# Patient Record
Sex: Female | Born: 1935
Health system: Southern US, Community
[De-identification: ages and names within clinical notes are randomized; demographics above are authoritative.]

## PROBLEM LIST (undated history)

## (undated) DIAGNOSIS — G20A1 Parkinson's disease without dyskinesia, without mention of fluctuations: Secondary | ICD-10-CM

## (undated) DIAGNOSIS — F419 Anxiety disorder, unspecified: Secondary | ICD-10-CM

## (undated) DIAGNOSIS — F32A Depression, unspecified: Secondary | ICD-10-CM

## (undated) DIAGNOSIS — D126 Benign neoplasm of colon, unspecified: Secondary | ICD-10-CM

## (undated) DIAGNOSIS — C3492 Malignant neoplasm of unspecified part of left bronchus or lung: Secondary | ICD-10-CM

## (undated) DIAGNOSIS — R918 Other nonspecific abnormal finding of lung field: Principal | ICD-10-CM

## (undated) DIAGNOSIS — N390 Urinary tract infection, site not specified: Secondary | ICD-10-CM

## (undated) DIAGNOSIS — K5909 Other constipation: Secondary | ICD-10-CM

## (undated) DIAGNOSIS — C7951 Secondary malignant neoplasm of bone: Secondary | ICD-10-CM

## (undated) DIAGNOSIS — K649 Unspecified hemorrhoids: Secondary | ICD-10-CM

## (undated) DIAGNOSIS — I950 Idiopathic hypotension: Secondary | ICD-10-CM

## (undated) DIAGNOSIS — C787 Secondary malignant neoplasm of liver and intrahepatic bile duct: Secondary | ICD-10-CM

## (undated) DIAGNOSIS — K219 Gastro-esophageal reflux disease without esophagitis: Secondary | ICD-10-CM

## (undated) DIAGNOSIS — G20 Parkinson's disease: Secondary | ICD-10-CM

## (undated) DIAGNOSIS — J449 Chronic obstructive pulmonary disease, unspecified: Secondary | ICD-10-CM

## (undated) DIAGNOSIS — M1990 Unspecified osteoarthritis, unspecified site: Secondary | ICD-10-CM

## (undated) DIAGNOSIS — C439 Malignant melanoma of skin, unspecified: Secondary | ICD-10-CM

## (undated) DIAGNOSIS — B351 Tinea unguium: Secondary | ICD-10-CM

## (undated) DIAGNOSIS — E785 Hyperlipidemia, unspecified: Secondary | ICD-10-CM

## (undated) DIAGNOSIS — F329 Major depressive disorder, single episode, unspecified: Secondary | ICD-10-CM

## (undated) DIAGNOSIS — C3490 Malignant neoplasm of unspecified part of unspecified bronchus or lung: Secondary | ICD-10-CM

## (undated) DIAGNOSIS — R42 Dizziness and giddiness: Secondary | ICD-10-CM

## (undated) DIAGNOSIS — I8390 Asymptomatic varicose veins of unspecified lower extremity: Secondary | ICD-10-CM

## (undated) DIAGNOSIS — Q249 Congenital malformation of heart, unspecified: Secondary | ICD-10-CM

## (undated) DIAGNOSIS — I4891 Unspecified atrial fibrillation: Secondary | ICD-10-CM

## (undated) HISTORY — DX: Major depressive disorder, single episode, unspecified: F32.9

## (undated) HISTORY — DX: Secondary malignant neoplasm of liver and intrahepatic bile duct: C78.7

## (undated) HISTORY — PX: APPENDECTOMY: SHX54

## (undated) HISTORY — DX: Hyperlipidemia, unspecified: E78.5

## (undated) HISTORY — DX: Unspecified hemorrhoids: K64.9

## (undated) HISTORY — DX: Chronic obstructive pulmonary disease, unspecified: J44.9

## (undated) HISTORY — DX: Asymptomatic varicose veins of unspecified lower extremity: I83.90

## (undated) HISTORY — DX: Benign neoplasm of colon, unspecified: D12.6

## (undated) HISTORY — DX: Gastro-esophageal reflux disease without esophagitis: K21.9

## (undated) HISTORY — DX: Tinea unguium: B35.1

## (undated) HISTORY — DX: Malignant melanoma of skin, unspecified: C43.9

## (undated) HISTORY — DX: Unspecified atrial fibrillation: I48.91

## (undated) HISTORY — DX: Unspecified osteoarthritis, unspecified site: M19.90

## (undated) HISTORY — DX: Other nonspecific abnormal finding of lung field: R91.8

## (undated) HISTORY — DX: Parkinson's disease without dyskinesia, without mention of fluctuations: G20.A1

## (undated) HISTORY — PX: OTHER SURGICAL HISTORY: SHX169

## (undated) HISTORY — DX: Anxiety disorder, unspecified: F41.9

## (undated) HISTORY — DX: Malignant neoplasm of unspecified part of unspecified bronchus or lung: C34.90

## (undated) HISTORY — DX: Other constipation: K59.09

## (undated) HISTORY — DX: Secondary malignant neoplasm of bone: C79.51

## (undated) HISTORY — DX: Depression, unspecified: F32.A

## (undated) HISTORY — DX: Malignant neoplasm of unspecified part of left bronchus or lung: C34.92

## (undated) HISTORY — DX: Congenital malformation of heart, unspecified: Q24.9

## (undated) HISTORY — DX: Parkinson's disease: G20

## (undated) HISTORY — DX: Urinary tract infection, site not specified: N39.0

## (undated) HISTORY — DX: Idiopathic hypotension: I95.0

---

## 1992-02-20 DIAGNOSIS — D126 Benign neoplasm of colon, unspecified: Secondary | ICD-10-CM

## 1992-02-20 HISTORY — DX: Benign neoplasm of colon, unspecified: D12.6

## 1997-09-26 ENCOUNTER — Ambulatory Visit (HOSPITAL_COMMUNITY): Admission: RE | Admit: 1997-09-26 | Discharge: 1997-09-26 | Payer: Self-pay | Admitting: Internal Medicine

## 1999-12-09 ENCOUNTER — Other Ambulatory Visit: Admission: RE | Admit: 1999-12-09 | Discharge: 1999-12-09 | Payer: Self-pay | Admitting: Obstetrics & Gynecology

## 2002-06-20 ENCOUNTER — Other Ambulatory Visit: Admission: RE | Admit: 2002-06-20 | Discharge: 2002-06-20 | Payer: Self-pay | Admitting: Obstetrics & Gynecology

## 2002-10-27 ENCOUNTER — Encounter: Admission: RE | Admit: 2002-10-27 | Discharge: 2002-10-27 | Payer: Self-pay | Admitting: Internal Medicine

## 2002-10-27 ENCOUNTER — Encounter: Payer: Self-pay | Admitting: Internal Medicine

## 2003-02-28 ENCOUNTER — Encounter: Admission: RE | Admit: 2003-02-28 | Discharge: 2003-05-19 | Payer: Self-pay | Admitting: Internal Medicine

## 2004-03-26 ENCOUNTER — Ambulatory Visit (HOSPITAL_COMMUNITY): Admission: RE | Admit: 2004-03-26 | Discharge: 2004-03-26 | Payer: Self-pay | Admitting: Obstetrics & Gynecology

## 2004-03-26 ENCOUNTER — Encounter (INDEPENDENT_AMBULATORY_CARE_PROVIDER_SITE_OTHER): Payer: Self-pay | Admitting: *Deleted

## 2005-02-06 ENCOUNTER — Encounter: Admission: RE | Admit: 2005-02-06 | Discharge: 2005-02-06 | Payer: Self-pay | Admitting: Obstetrics & Gynecology

## 2005-02-06 ENCOUNTER — Encounter (INDEPENDENT_AMBULATORY_CARE_PROVIDER_SITE_OTHER): Payer: Self-pay | Admitting: *Deleted

## 2005-08-29 ENCOUNTER — Ambulatory Visit: Payer: Self-pay | Admitting: Gastroenterology

## 2005-09-10 ENCOUNTER — Encounter: Admission: RE | Admit: 2005-09-10 | Discharge: 2005-09-10 | Payer: Self-pay | Admitting: Obstetrics & Gynecology

## 2005-09-23 ENCOUNTER — Ambulatory Visit: Payer: Self-pay | Admitting: Gastroenterology

## 2006-01-24 ENCOUNTER — Inpatient Hospital Stay (HOSPITAL_COMMUNITY): Admission: EM | Admit: 2006-01-24 | Discharge: 2006-01-25 | Payer: Self-pay | Admitting: Emergency Medicine

## 2006-02-08 ENCOUNTER — Observation Stay (HOSPITAL_COMMUNITY): Admission: EM | Admit: 2006-02-08 | Discharge: 2006-02-09 | Payer: Self-pay | Admitting: Emergency Medicine

## 2006-04-21 HISTORY — PX: COLON RESECTION: SHX5231

## 2006-09-29 ENCOUNTER — Encounter: Admission: RE | Admit: 2006-09-29 | Discharge: 2006-09-29 | Payer: Self-pay | Admitting: Obstetrics & Gynecology

## 2007-02-01 ENCOUNTER — Encounter: Admission: RE | Admit: 2007-02-01 | Discharge: 2007-03-03 | Payer: Self-pay | Admitting: *Deleted

## 2007-02-12 ENCOUNTER — Emergency Department (HOSPITAL_COMMUNITY): Admission: EM | Admit: 2007-02-12 | Discharge: 2007-02-12 | Payer: Self-pay | Admitting: Emergency Medicine

## 2007-02-22 ENCOUNTER — Emergency Department (HOSPITAL_COMMUNITY): Admission: EM | Admit: 2007-02-22 | Discharge: 2007-02-22 | Payer: Self-pay | Admitting: *Deleted

## 2007-02-25 ENCOUNTER — Encounter (INDEPENDENT_AMBULATORY_CARE_PROVIDER_SITE_OTHER): Payer: Self-pay | Admitting: General Surgery

## 2007-02-25 ENCOUNTER — Inpatient Hospital Stay (HOSPITAL_COMMUNITY): Admission: EM | Admit: 2007-02-25 | Discharge: 2007-03-10 | Payer: Self-pay | Admitting: Emergency Medicine

## 2007-04-09 ENCOUNTER — Inpatient Hospital Stay (HOSPITAL_COMMUNITY): Admission: EM | Admit: 2007-04-09 | Discharge: 2007-04-14 | Payer: Self-pay | Admitting: Emergency Medicine

## 2007-05-26 ENCOUNTER — Ambulatory Visit: Payer: Self-pay | Admitting: Gastroenterology

## 2007-08-12 ENCOUNTER — Ambulatory Visit: Payer: Self-pay | Admitting: Gastroenterology

## 2007-08-26 ENCOUNTER — Encounter (INDEPENDENT_AMBULATORY_CARE_PROVIDER_SITE_OTHER): Payer: Self-pay | Admitting: *Deleted

## 2007-08-26 ENCOUNTER — Inpatient Hospital Stay (HOSPITAL_COMMUNITY): Admission: EM | Admit: 2007-08-26 | Discharge: 2007-08-28 | Payer: Self-pay | Admitting: Emergency Medicine

## 2007-08-27 ENCOUNTER — Encounter: Payer: Self-pay | Admitting: Family Medicine

## 2007-10-25 ENCOUNTER — Encounter: Admission: RE | Admit: 2007-10-25 | Discharge: 2007-10-25 | Payer: Self-pay | Admitting: Obstetrics & Gynecology

## 2008-02-14 ENCOUNTER — Ambulatory Visit: Payer: Self-pay | Admitting: Gastroenterology

## 2008-02-14 DIAGNOSIS — K5900 Constipation, unspecified: Secondary | ICD-10-CM | POA: Insufficient documentation

## 2008-02-14 DIAGNOSIS — F411 Generalized anxiety disorder: Secondary | ICD-10-CM | POA: Insufficient documentation

## 2008-02-14 DIAGNOSIS — K648 Other hemorrhoids: Secondary | ICD-10-CM | POA: Insufficient documentation

## 2008-02-14 DIAGNOSIS — Z860100 Personal history of colon polyps, unspecified: Secondary | ICD-10-CM | POA: Insufficient documentation

## 2008-10-25 ENCOUNTER — Encounter: Admission: RE | Admit: 2008-10-25 | Discharge: 2008-10-25 | Payer: Self-pay | Admitting: Internal Medicine

## 2008-11-09 ENCOUNTER — Inpatient Hospital Stay (HOSPITAL_COMMUNITY): Admission: EM | Admit: 2008-11-09 | Discharge: 2008-11-10 | Payer: Self-pay | Admitting: Emergency Medicine

## 2008-12-01 ENCOUNTER — Ambulatory Visit (HOSPITAL_COMMUNITY): Payer: Self-pay | Admitting: Licensed Clinical Social Worker

## 2008-12-15 ENCOUNTER — Ambulatory Visit (HOSPITAL_COMMUNITY): Payer: Self-pay | Admitting: Licensed Clinical Social Worker

## 2008-12-29 ENCOUNTER — Ambulatory Visit (HOSPITAL_COMMUNITY): Payer: Self-pay | Admitting: Licensed Clinical Social Worker

## 2009-01-19 ENCOUNTER — Ambulatory Visit: Payer: Self-pay | Admitting: Pulmonary Disease

## 2009-01-19 DIAGNOSIS — R0989 Other specified symptoms and signs involving the circulatory and respiratory systems: Secondary | ICD-10-CM | POA: Insufficient documentation

## 2009-01-19 DIAGNOSIS — R0609 Other forms of dyspnea: Secondary | ICD-10-CM

## 2009-02-06 ENCOUNTER — Ambulatory Visit: Payer: Self-pay | Admitting: Pulmonary Disease

## 2009-02-09 ENCOUNTER — Ambulatory Visit (HOSPITAL_COMMUNITY): Payer: Self-pay | Admitting: Licensed Clinical Social Worker

## 2009-03-05 ENCOUNTER — Emergency Department (HOSPITAL_COMMUNITY): Admission: EM | Admit: 2009-03-05 | Discharge: 2009-03-05 | Payer: Self-pay | Admitting: Emergency Medicine

## 2009-03-13 ENCOUNTER — Emergency Department (HOSPITAL_COMMUNITY): Admission: EM | Admit: 2009-03-13 | Discharge: 2009-03-13 | Payer: Self-pay | Admitting: Emergency Medicine

## 2009-03-19 ENCOUNTER — Encounter: Payer: Self-pay | Admitting: Nurse Practitioner

## 2009-03-23 ENCOUNTER — Ambulatory Visit (HOSPITAL_COMMUNITY): Payer: Self-pay | Admitting: Licensed Clinical Social Worker

## 2009-03-26 ENCOUNTER — Encounter: Payer: Self-pay | Admitting: Family Medicine

## 2009-03-29 ENCOUNTER — Encounter: Payer: Self-pay | Admitting: Nurse Practitioner

## 2009-03-29 ENCOUNTER — Telehealth: Payer: Self-pay | Admitting: Gastroenterology

## 2009-04-02 ENCOUNTER — Ambulatory Visit: Payer: Self-pay | Admitting: Gastroenterology

## 2009-04-02 DIAGNOSIS — R197 Diarrhea, unspecified: Secondary | ICD-10-CM

## 2009-04-02 LAB — CONVERTED CEMR LAB
Cholesterol, target level: 200 mg/dL
HDL goal, serum: 40 mg/dL

## 2009-04-03 ENCOUNTER — Telehealth (INDEPENDENT_AMBULATORY_CARE_PROVIDER_SITE_OTHER): Payer: Self-pay | Admitting: *Deleted

## 2009-04-18 ENCOUNTER — Ambulatory Visit (HOSPITAL_COMMUNITY): Payer: Self-pay | Admitting: Licensed Clinical Social Worker

## 2009-04-30 ENCOUNTER — Encounter (INDEPENDENT_AMBULATORY_CARE_PROVIDER_SITE_OTHER): Payer: Self-pay | Admitting: *Deleted

## 2009-04-30 ENCOUNTER — Ambulatory Visit: Payer: Self-pay | Admitting: Gastroenterology

## 2009-04-30 DIAGNOSIS — R197 Diarrhea, unspecified: Secondary | ICD-10-CM

## 2009-05-02 ENCOUNTER — Telehealth: Payer: Self-pay | Admitting: Gastroenterology

## 2009-05-04 ENCOUNTER — Telehealth: Payer: Self-pay | Admitting: Gastroenterology

## 2009-05-07 ENCOUNTER — Ambulatory Visit: Payer: Self-pay | Admitting: Gastroenterology

## 2009-05-08 ENCOUNTER — Encounter: Payer: Self-pay | Admitting: Gastroenterology

## 2009-05-08 ENCOUNTER — Telehealth: Payer: Self-pay | Admitting: Gastroenterology

## 2009-05-11 ENCOUNTER — Ambulatory Visit (HOSPITAL_COMMUNITY): Payer: Self-pay | Admitting: Licensed Clinical Social Worker

## 2009-05-18 ENCOUNTER — Ambulatory Visit (HOSPITAL_COMMUNITY): Payer: Self-pay | Admitting: Licensed Clinical Social Worker

## 2009-05-21 ENCOUNTER — Ambulatory Visit (HOSPITAL_COMMUNITY): Payer: Self-pay | Admitting: Psychiatry

## 2009-05-22 ENCOUNTER — Encounter: Admission: RE | Admit: 2009-05-22 | Discharge: 2009-05-31 | Payer: Self-pay | Admitting: Internal Medicine

## 2009-05-31 ENCOUNTER — Ambulatory Visit (HOSPITAL_COMMUNITY): Payer: Self-pay | Admitting: Licensed Clinical Social Worker

## 2009-06-11 ENCOUNTER — Ambulatory Visit (HOSPITAL_COMMUNITY): Payer: Self-pay | Admitting: Psychiatry

## 2009-06-14 ENCOUNTER — Ambulatory Visit (HOSPITAL_COMMUNITY): Payer: Self-pay | Admitting: Licensed Clinical Social Worker

## 2009-06-27 ENCOUNTER — Ambulatory Visit (HOSPITAL_COMMUNITY): Payer: Self-pay | Admitting: Licensed Clinical Social Worker

## 2009-07-23 ENCOUNTER — Ambulatory Visit (HOSPITAL_COMMUNITY): Payer: Self-pay | Admitting: Psychiatry

## 2009-07-24 ENCOUNTER — Ambulatory Visit (HOSPITAL_COMMUNITY): Payer: Self-pay | Admitting: Licensed Clinical Social Worker

## 2009-08-13 ENCOUNTER — Ambulatory Visit (HOSPITAL_COMMUNITY): Payer: Self-pay | Admitting: Licensed Clinical Social Worker

## 2009-09-19 ENCOUNTER — Ambulatory Visit (HOSPITAL_COMMUNITY): Payer: Self-pay | Admitting: Licensed Clinical Social Worker

## 2009-10-03 ENCOUNTER — Ambulatory Visit (HOSPITAL_COMMUNITY): Payer: Self-pay | Admitting: Licensed Clinical Social Worker

## 2009-10-19 ENCOUNTER — Ambulatory Visit (HOSPITAL_COMMUNITY): Payer: Self-pay | Admitting: Licensed Clinical Social Worker

## 2009-10-26 ENCOUNTER — Encounter: Admission: RE | Admit: 2009-10-26 | Discharge: 2009-10-26 | Payer: Self-pay | Admitting: Internal Medicine

## 2009-10-30 ENCOUNTER — Ambulatory Visit (HOSPITAL_COMMUNITY): Payer: Self-pay | Admitting: Licensed Clinical Social Worker

## 2009-11-12 ENCOUNTER — Ambulatory Visit (HOSPITAL_COMMUNITY): Payer: Self-pay | Admitting: Psychiatry

## 2009-11-13 ENCOUNTER — Ambulatory Visit (HOSPITAL_COMMUNITY): Payer: Self-pay | Admitting: Licensed Clinical Social Worker

## 2009-11-28 ENCOUNTER — Ambulatory Visit (HOSPITAL_COMMUNITY): Payer: Self-pay | Admitting: Licensed Clinical Social Worker

## 2009-12-12 ENCOUNTER — Ambulatory Visit (HOSPITAL_COMMUNITY): Payer: Self-pay | Admitting: Licensed Clinical Social Worker

## 2009-12-16 ENCOUNTER — Emergency Department (HOSPITAL_COMMUNITY): Admission: EM | Admit: 2009-12-16 | Discharge: 2009-12-16 | Payer: Self-pay | Admitting: Emergency Medicine

## 2009-12-27 ENCOUNTER — Ambulatory Visit (HOSPITAL_COMMUNITY): Payer: Self-pay | Admitting: Licensed Clinical Social Worker

## 2010-01-07 ENCOUNTER — Ambulatory Visit (HOSPITAL_COMMUNITY): Payer: Self-pay | Admitting: Licensed Clinical Social Worker

## 2010-01-18 ENCOUNTER — Ambulatory Visit (HOSPITAL_COMMUNITY): Payer: Self-pay | Admitting: Psychiatry

## 2010-01-28 ENCOUNTER — Ambulatory Visit (HOSPITAL_COMMUNITY): Payer: Self-pay | Admitting: Licensed Clinical Social Worker

## 2010-02-05 ENCOUNTER — Encounter: Payer: Self-pay | Admitting: Family Medicine

## 2010-02-05 ENCOUNTER — Ambulatory Visit (HOSPITAL_COMMUNITY): Payer: Self-pay | Admitting: Licensed Clinical Social Worker

## 2010-02-19 ENCOUNTER — Ambulatory Visit (HOSPITAL_COMMUNITY): Payer: Self-pay | Admitting: Licensed Clinical Social Worker

## 2010-02-20 ENCOUNTER — Encounter: Payer: Self-pay | Admitting: Family Medicine

## 2010-03-05 ENCOUNTER — Ambulatory Visit (HOSPITAL_COMMUNITY): Payer: Self-pay | Admitting: Licensed Clinical Social Worker

## 2010-03-18 ENCOUNTER — Emergency Department (HOSPITAL_COMMUNITY)
Admission: EM | Admit: 2010-03-18 | Discharge: 2010-03-18 | Payer: Self-pay | Source: Home / Self Care | Admitting: Emergency Medicine

## 2010-03-21 ENCOUNTER — Ambulatory Visit (HOSPITAL_COMMUNITY): Payer: Self-pay | Admitting: Licensed Clinical Social Worker

## 2010-03-22 ENCOUNTER — Ambulatory Visit: Payer: Self-pay | Admitting: Family Medicine

## 2010-03-22 ENCOUNTER — Encounter: Payer: Self-pay | Admitting: Family Medicine

## 2010-03-22 DIAGNOSIS — I4891 Unspecified atrial fibrillation: Secondary | ICD-10-CM

## 2010-03-22 DIAGNOSIS — R42 Dizziness and giddiness: Secondary | ICD-10-CM

## 2010-03-25 LAB — CONVERTED CEMR LAB
CO2: 25 meq/L (ref 19–32)
Calcium: 9.4 mg/dL (ref 8.4–10.5)
Hemoglobin: 12.1 g/dL (ref 12.0–15.0)
Lymphocytes Relative: 25 % (ref 12–46)
MCHC: 32.4 g/dL (ref 30.0–36.0)
Monocytes Relative: 10 % (ref 3–12)
Neutrophils Relative %: 63 % (ref 43–77)
RDW: 14.1 % (ref 11.5–15.5)
TSH: 3.635 microintl units/mL (ref 0.350–4.500)
WBC: 8.7 10*3/uL (ref 4.0–10.5)

## 2010-03-27 ENCOUNTER — Ambulatory Visit: Payer: Self-pay | Admitting: Family Medicine

## 2010-03-27 ENCOUNTER — Ambulatory Visit (HOSPITAL_BASED_OUTPATIENT_CLINIC_OR_DEPARTMENT_OTHER)
Admission: RE | Admit: 2010-03-27 | Discharge: 2010-03-27 | Payer: Self-pay | Source: Home / Self Care | Admitting: Family Medicine

## 2010-03-27 DIAGNOSIS — S0100XA Unspecified open wound of scalp, initial encounter: Secondary | ICD-10-CM | POA: Insufficient documentation

## 2010-03-28 ENCOUNTER — Telehealth (INDEPENDENT_AMBULATORY_CARE_PROVIDER_SITE_OTHER): Payer: Self-pay | Admitting: *Deleted

## 2010-03-28 ENCOUNTER — Encounter
Admission: RE | Admit: 2010-03-28 | Discharge: 2010-03-28 | Payer: Self-pay | Source: Home / Self Care | Attending: Internal Medicine | Admitting: Internal Medicine

## 2010-03-28 DIAGNOSIS — S32009A Unspecified fracture of unspecified lumbar vertebra, initial encounter for closed fracture: Secondary | ICD-10-CM | POA: Insufficient documentation

## 2010-04-01 ENCOUNTER — Ambulatory Visit (HOSPITAL_COMMUNITY): Payer: Self-pay | Admitting: Psychiatry

## 2010-04-01 ENCOUNTER — Telehealth: Payer: Self-pay | Admitting: Family Medicine

## 2010-04-02 ENCOUNTER — Telehealth (INDEPENDENT_AMBULATORY_CARE_PROVIDER_SITE_OTHER): Payer: Self-pay | Admitting: *Deleted

## 2010-04-02 ENCOUNTER — Ambulatory Visit (HOSPITAL_COMMUNITY): Payer: Self-pay | Admitting: Licensed Clinical Social Worker

## 2010-04-03 ENCOUNTER — Encounter: Payer: Self-pay | Admitting: Family Medicine

## 2010-04-17 ENCOUNTER — Ambulatory Visit (HOSPITAL_COMMUNITY)
Admission: RE | Admit: 2010-04-17 | Discharge: 2010-04-17 | Payer: Self-pay | Source: Home / Self Care | Attending: Family Medicine | Admitting: Family Medicine

## 2010-04-18 ENCOUNTER — Telehealth (INDEPENDENT_AMBULATORY_CARE_PROVIDER_SITE_OTHER): Payer: Self-pay | Admitting: *Deleted

## 2010-04-18 ENCOUNTER — Encounter
Admission: RE | Admit: 2010-04-18 | Discharge: 2010-04-18 | Payer: Self-pay | Source: Home / Self Care | Attending: Family Medicine | Admitting: Family Medicine

## 2010-04-19 ENCOUNTER — Ambulatory Visit (HOSPITAL_COMMUNITY)
Admission: RE | Admit: 2010-04-19 | Discharge: 2010-04-19 | Payer: Self-pay | Source: Home / Self Care | Attending: Psychiatry | Admitting: Psychiatry

## 2010-05-04 ENCOUNTER — Encounter: Payer: Self-pay | Admitting: Family Medicine

## 2010-05-09 ENCOUNTER — Ambulatory Visit (HOSPITAL_COMMUNITY)
Admission: RE | Admit: 2010-05-09 | Discharge: 2010-05-09 | Payer: Self-pay | Source: Home / Self Care | Attending: Licensed Clinical Social Worker | Admitting: Licensed Clinical Social Worker

## 2010-05-12 ENCOUNTER — Encounter: Payer: Self-pay | Admitting: Obstetrics & Gynecology

## 2010-05-12 ENCOUNTER — Encounter: Payer: Self-pay | Admitting: Ophthalmology

## 2010-05-21 ENCOUNTER — Ambulatory Visit (HOSPITAL_COMMUNITY)
Admission: RE | Admit: 2010-05-21 | Discharge: 2010-05-21 | Payer: Self-pay | Source: Home / Self Care | Attending: Licensed Clinical Social Worker | Admitting: Licensed Clinical Social Worker

## 2010-05-23 NOTE — Progress Notes (Signed)
Summary: Interventional Radiology referral  Phone Note Other Incoming Call back at [phone] Darel Hong   Caller: [facility] Summary of Call: Per phone call from [facility], Per Dr. Maryclare Bean, Radiologist, he is requiring patient to have a Nuclear Bone Scan in addition to the CT she has already had, prior to scheduling her for the Vertebroplasty, will you order please. Initial call taken by: Magdalen Spatz [facility],  April 01, 2010 3:09 PM  Follow-up for Phone Call        ok. Follow-up by: Neena Rhymes MD,  April 01, 2010 3:35 PM

## 2010-05-23 NOTE — Letter (Signed)
Summary: [facility]  [facility]   Imported By: Lanelle Bal 05/17/2010 11:42:44  _____________________________________________________________________  External Attachment:    Type:   Image     Comment:   External Document

## 2010-05-23 NOTE — Progress Notes (Signed)
Summary: triage   Phone Note Call from Patient Call back at Home Phone [phone]   Caller: Patient Call For: Russella Dar Reason for Call: Talk to Nurse Summary of Call: Patient is scheduled for a procedure on Monday but is having problems with her neck, (stiff neck) wants to know if she can take meds for this. Initial call taken by: Tawni Levy,  May 02, 2009 1:05 PM  Follow-up for Phone Call        Pt. c/o a stiff neck and sore throat. Denes fever, n/v.  States she has been around several people with colds/flu.  1) Tylenol as needed 2) Chloraseptic spray or warm salt water gargles as needed 3) If cold/flu symptoms progress, please call your PCP ASAP 4) Pt. instructed to call back as needed.  Follow-up by: Laureen Ochs LPN,  May 02, 2009 2:32 PM  Additional Follow-up for Phone Call Additional follow up Details #1::        Agree. Please have her discuss further with PCP if symtoms persist. Additional Follow-up by: Meryl Dare MD [facility],  May 02, 2009 2:42 PM

## 2010-05-23 NOTE — Procedures (Signed)
Summary: Colonoscopy  Patient: [name] Note: All result statuses are Final unless otherwise noted.  Tests: (1) Colonoscopy (COL)   COL Colonoscopy           DONE     [facility]     [address]     [place], [place]  [address]           COLONOSCOPY PROCEDURE REPORT           PATIENT:  [name]  MR#:  [id number]     BIRTHDATE:  11-09-1927, 77 yrs. old  GENDER:  female           ENDOSCOPIST:  Judie Petit T. 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           MEDICATIONS:   Fentanyl 100 mcg IV, Versed 10 mg IV           DESCRIPTION OF PROCEDURE:   After the risks benefits and     alternatives of the procedure were thoroughly explained, informed     consent was obtained.  Digital rectal exam was performed and     revealed no abnormalities.   The LB [id number] endoscope     was introduced through the anus and advanced to the cecum, which     was identified by both the appendix and ileocecal valve, limited     by fair prep, a tortuous and redundant colon.    The quality of     the prep was Moviprep fair.  Extensive rinsing and suctioning was     required to acheive an adequate, fair prep. The instrument was     then slowly withdrawn as the colon was fully examined.     <<PROCEDUREIMAGES>>           FINDINGS:  A normal appearing cecum, ileocecal valve, and     appendiceal orifice were identified. The ascending, hepatic     flexure, transverse, splenic flexure, descending, sigmoid colon,     and rectum appeared unremarkable.  Random biopsies were obtained     throughout the colon and sent to pathology.   Retroflexed views in     the rectum revealed internal hemorrhoids, small. The time to cecum     =  9  minutes. The scope was then withdrawn (time =  12.75  min)     from  the patient and the procedure completed.           COMPLICATIONS:  None           ENDOSCOPIC IMPRESSION:     1) Normal colon     2) Internal hemorrhoids           RECOMMENDATIONS:     1) Return to the care of your primary provider. GI follow up as     needed.     2) DC metronidazole     3) Align one daily for 1-2 months     4) Imodium qd as needed for diarrhea     5) No plans for future surveillance colonoscopies given lack of     polyps today and age     77) Await pathology  Venita Lick. Russella Dar, MD, [facility] Graham           CC: Murray Hodgkins, MD           n.     Rosalie DoctorVenita Lick. Stark at 05/07/2009 09:08 AM           Harvel Ricks, [id number]  Note: An exclamation mark (!) indicates a result that was not dispersed into the flowsheet. Document Creation Date: 05/07/2009 9:08 AM _______________________________________________________________________  (1) Order result status: Final Collection or observation date-time: 05/07/2009 09:02 Requested date-time:  Receipt date-time:  Reported date-time:  Referring Physician:   Ordering Physician: Claudette Head [phone]) Specimen Source:  Source: Launa Grill Order Number: [id number] Lab site:

## 2010-05-23 NOTE — Cardiovascular Report (Signed)
Summary: [facility]  [facility]   Imported By: Lanelle Bal 04/03/2010 15:53:26  _____________________________________________________________________  External Attachment:    Type:   Image     Comment:   External Document

## 2010-05-23 NOTE — Progress Notes (Signed)
Summary: f.u call   Phone Note Call from Patient Call back at Home Phone [phone]   Caller: Patient Call For: [name] Reason for Call: Talk to Nurse Summary of Call: Patient is calling back because last night she had a temperature of 100.4 and took tylenol and was tol d to call us this afternoon and advised what was her temp and she states that is 97.2 (states that it's below normal) Initial call taken by: Tawni Levy,  May 08, 2009 12:23 PM  Follow-up for Phone Call        returned pt. call. Pt. just wanted to tell us her temp. is now 97.2 and she feels better. She is eating and has had loose bm and has passed flatus. Thanked pt. for calling us back. Follow-up by: Alease Frame RN,  May 08, 2009 1:52 PM

## 2010-05-23 NOTE — Assessment & Plan Note (Signed)
Summary: f/u Diarrhea   History of Present Illness Visit Type: Follow-up Visit Primary GI MD: Elie Goody MD [facility] Primary Provider: Murray Hodgkins, MD / Dr. Marijo [name]  Chief Complaint: F/u for diarrhea and still having numerous loose stools qd.  History of Present Illness:   [name] complains of recurrent diarrhea. Completed in prolonged course of metronidazole for presumed C. difficile diarrhea on 12/20 and her diarrhea had completely resolved. She noted recurrent diarrhea beginning on January 6. She is now having 3-5 loose bowel movements each morning. She has no other gastrointestinal complaints.   GI Review of Systems      Denies abdominal pain, acid reflux, belching, bloating, chest pain, dysphagia with liquids, dysphagia with solids, heartburn, loss of appetite, nausea, vomiting, vomiting blood, weight loss, and  weight gain.      Reports diarrhea.     Denies anal fissure, black tarry stools, change in bowel habit, constipation, diverticulosis, fecal incontinence, heme positive stool, hemorrhoids, irritable bowel syndrome, jaundice, light color stool, liver problems, rectal bleeding, and  rectal pain.   Current Medications (verified): 1)  Calcium (Unknown Dosage) .... Take 1 Tablet By Mouth Once A Day 2)  Fish Oil 1000 Mg Caps (Omega-3 Fatty Acids) .... Take 1 Capsule By Mouth Once A Day 3)  Multivitamins  Tabs (Multiple Vitamin) .... Take 1 Tablet By Mouth Once A Day 4)  Imodium A-D 2 Mg Tabs (Loperamide Hcl) .... Take 1 Tablet By Mouth Two Times A Day 5)  Citalopram Hydrobromide 20 Mg Tabs (Citalopram Hydrobromide) .... Take 1 Tablet By Mouth Once A Day  Allergies (verified): 1)  ! Amoxicillin 2)  ! * Ciprofloxacin  Past History:  Past Medical History: Reviewed history from 04/02/2009 and no changes required. 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 Fib Recurrent UTIs  Past Surgical History: Reviewed history from 02/14/2008 and no changes required. 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 No FH of Colon Cancer: asthma: brother cancer: father (throat)  Lupus Two Daughters  Social History: Reviewed history from 01/19/2009 and no changes required. widowed. pt has children. retired.  previously worked as a Product/process development scientist. Patient is a former smoker.  started at age 35.  1 1/2 ppd.  quit 1982. Alcohol Use - no  Review of Systems       The pertinent positives and negatives are noted as above and in the HPI. All other ROS were reviewed and were negative.   Vital Signs:  Patient profile:   77 year old female Height:      72 inches Weight:      145.4 pounds BMI:     19.79 Pulse rate:   90 / minute Pulse rhythm:   regular BP sitting:   90 / 42  (right arm) Cuff size:   regular  Vitals Entered By: Christie Nottingham CMA Duncan Dull) (April 30, 2009 9:11 AM)  Physical Exam  General:  Well developed, well nourished, no acute distress. Head:  Normocephalic and atraumatic. Eyes:  PERRLA, no icterus. Ears:  Normal auditory acuity. Mouth:  No deformity or lesions, dentition normal. Lungs:  Clear throughout to auscultation. Heart:  Regular rate and rhythm; no murmurs, rubs,  or bruits. Abdomen:  Soft, nontender and nondistended. No masses, hepatosplenomegaly or hernias noted. Normal bowel sounds. Rectal:  deferred until time of colonoscopy.   Psych:  very anxious.  Impression & Recommendations:  Problem # 1:  DIARRHEA (ICD-787.91) Sshe has a history of chronic constipation, but has had recurrent diarrhea after taking several courses of antibiotics for urinary tract infections. This was presumed to be C. difficile, and appeared to respond to metronidazole. Rule out recurrent C.  difficile, lymphocytic colitis and other disorders. Retreat with 4 weeks of metronidazole. The risks, benefits and alternatives to colonoscopy with possible biopsy and possible polypectomy were discussed with the patient and they consent to proceed. The procedure will be scheduled electively.  Problem # 2:  ANXIETY (ICD-300.00) She has substantial anxiety, which appears to be interfering with her functional status. Defer to Dr. Chilton Si for further management.  Other Orders: Colonoscopy (Colon)  Patient Instructions: 1)  Pick up your prescriptions at your pharmacy.  2)  Colonoscopy brochure given.  3)  Conscious Sedation brochure given.  4)  Copy sent to : Catherine Hodgkins, MD 5)  The medication list was reviewed and reconciled.  All changed / newly prescribed medications were explained.  A complete medication list was provided to the patient / caregiver.  Prescriptions: METRONIDAZOLE 250 MG TABS (METRONIDAZOLE) one tablet by mouth four times a day  #112 x 0   Entered by:   Christie Nottingham CMA (AAMA)   Authorized by:   Meryl Dare MD [facility]   Signed by:   Meryl Dare MD FACG on 04/30/2009   Method used:   Electronically to        [facility] (retail)       [address]       [place], [place]  [address]       Ph: [phone]       Fax: [phone]   RxID:   [id number] MOVIPREP 100 GM  SOLR (PEG-KCL-NACL-NASULF-NA ASC-C) As per prep instructions.  #1 x 0   Entered by:   Christie Nottingham CMA (AAMA)   Authorized by:   Meryl Dare MD [facility]   Signed by:   Meryl Dare MD [facility] on 04/30/2009   Method used:   Electronically to        [facility] (retail)       [address]       [place], [place]  [address]       Ph: [phone]       Fax: [phone]   RxID:   [id number]

## 2010-05-23 NOTE — Progress Notes (Signed)
Summary: REFILL  Phone Note Refill Request Message from:  Patient on April 18, 2010 9:03 AM  Refills Requested: Medication #1:  OMEPRAZOLE 20 MG CPDR take one tablet daily.   Dosage confirmed as above?Dosage Confirmed   Supply Requested: 1 month [facility]  Next Appointment Scheduled: NONE Initial call taken by: Lavell Islam,  April 18, 2010 9:03 AM    Prescriptions: OMEPRAZOLE 20 MG CPDR (OMEPRAZOLE) take one tablet daily  #30 x 6   Entered by:   Doristine Devoid CMA   Authorized by:   Neena Rhymes MD   Signed by:   Doristine Devoid CMA on 04/18/2010   Method used:   Electronically to        [facility] (retail)       [address]       [place], [place]  [address]       Ph: [phone]       Fax: [phone]   RxID:   [id number]

## 2010-05-23 NOTE — Progress Notes (Signed)
Summary: meds symptoms   Phone Note Call from Patient Call back at [phone] (daughter)   Caller: daughter, [name] Call For: Dr. Russella Dar Reason for Call: Talk to Nurse Summary of Call: would like to discuss side effects of med pt is taking for diarrhea... pt complaining of a "stiff neck"  Initial call taken by: Vallarie Mare,  May 04, 2009 8:49 AM  Follow-up for Phone Call        I spoke with the patient's daughter she reports her mother is c/o a sore throat and a stiff neck.  Patient  did call on Tuesday and spoke with Stanton Kidney with the same complaints she was recommended at that time to see her primary care.  Patient  hasn't been to her primary care. I did try and reach the patient at her home this am , see phone note from the patient , she doesn't answer.  I have left a message for the patient to call us back and the daughter also tried to reach her while I was on the phone with her.  The daughter will continue and try and reach her mom and have her call us back. Follow-up by: Darcey Nora RN, CGRN,  May 04, 2009 9:56 AM  Additional Follow-up for Phone Call Additional follow up Details #1::        see other phone note from today Additional Follow-up by: Darcey Nora RN, CGRN,  May 04, 2009 11:43 AM

## 2010-05-23 NOTE — Letter (Signed)
Summary: [facility] Inst[facility]  [facility]  [address] [place], [place] [address]   Phone: [phone]  Fax: [phone]       LAMEKA DISLA    [birth year]    MRN: [id number]        Procedure Day /Date: Monday January 17th, 2011     Arrival Time: 7:30am     Procedure Time: 8:30am     Location of Procedure:                    _ x_  [facility] (4th Floor)                        PREPARATION FOR COLONOSCOPY WITH MOVIPREP   Starting 5 days prior to your procedure 05/02/09  do not eat nuts, seeds, popcorn, corn, beans, peas,  salads, or any raw vegetables.  Do not take any fiber supplements (e.g. Metamucil, Citrucel, and Benefiber).  THE DAY BEFORE YOUR PROCEDURE         DATE:  05/06/09   DAY:  Sunday   1.  Drink clear liquids the entire day-NO SOLID FOOD  2.  Do not drink anything colored red or purple.  Avoid juices with pulp.  No orange juice.  3.  Drink at least 64 oz. (8 glasses) of fluid/clear liquids during the day to prevent dehydration and help the prep work efficiently.  CLEAR LIQUIDS INCLUDE: Water Jello Ice Popsicles Tea (sugar ok, no milk/cream) Powdered fruit flavored drinks Coffee (sugar ok, no milk/cream) Gatorade Juice: apple, white grape, white cranberry  Lemonade Clear bullion, consomm, broth Carbonated beverages (any kind) Strained chicken noodle soup Hard Candy                             4.  In the morning, mix first dose of MoviPrep solution:    Empty 1 Pouch A and 1 Pouch B into the disposable container    Add lukewarm drinking water to the top line of the container. Mix to dissolve    Refrigerate (mixed solution should be used within 24 hrs)  5.  Begin drinking the prep at 5:00 p.m. The MoviPrep container is divided by 4 marks.   Every 15 minutes drink the solution down to the next mark (approximately 8 oz) until the full liter is complete.   6.  Follow completed prep with 16 oz of clear liquid of your  choice (Nothing red or purple).  Continue to drink clear liquids until bedtime.  7.  Before going to bed, mix second dose of MoviPrep solution:    Empty 1 Pouch A and 1 Pouch B into the disposable container    Add lukewarm drinking water to the top line of the container. Mix to dissolve    Refrigerate  THE DAY OF YOUR PROCEDURE      DATE:  05/07/09  DAY:  Monday  Beginning at 3:30  a.m. (5 hours before procedure):         1. Every 15 minutes, drink the solution down to the next mark (approx 8 oz) until the full liter is complete.  2. Follow completed prep with 16 oz. of clear liquid of your choice.    3. You may drink clear liquids until  6:30am  (2 HOURS BEFORE PROCEDURE).   MEDICATION INSTRUCTIONS  Unless otherwise instructed, you should take regular prescription medications with a small sip of  water   as early as possible the morning of your procedure.        OTHER INSTRUCTIONS  You will need a responsible adult at least 75 years of age to accompany you and drive you home.   This person must remain in the waiting room during your procedure.  Wear loose fitting clothing that is easily removed.  Leave jewelry and other valuables at home.  However, you may wish to bring a book to read or  an iPod/MP3 player to listen to music as you wait for your procedure to start.  Remove all body piercing jewelry and leave at home.  Total time from sign-in until discharge is approximately 2-3 hours.  You should go home directly after your procedure and rest.  You can resume normal activities the  day after your procedure.  The day of your procedure you should not:   Drive   Make legal decisions   Operate machinery   Drink alcohol   Return to work  You will receive specific instructions about eating, activities and medications before you leave.    The above instructions have been reviewed and explained to me by   Marchelle Folks.     I fully understand and can verbalize  these instructions _____________________________ Date _________

## 2010-05-23 NOTE — Progress Notes (Signed)
Summary: xray results  Phone Note Outgoing Call   Call placed by: Doristine Devoid CMA,  March 28, 2010 4:02 PM Call placed to: Patient Summary of Call: pt has 2 new lumbar compression fractures.  needs interventional radiology referral to determine whether treatment is appropriate.  also needs DEXA scan.  should start Ca + D supplement daily if not already taking one.  please ask whether she is having back pain.  if she is, we can give her some meds for pain.  Follow-up for Phone Call        spoke w/ patient aware of information and referral also informed would like appt after holidays .....Marland KitchenMarland KitchenDoristine Devoid CMA  March 28, 2010 4:20 PM   New Problems: COMPRESSION FRACTURE, LUMBAR VERTEBRAE (ICD-805.4)   New Problems: COMPRESSION FRACTURE, LUMBAR VERTEBRAE (ICD-805.4)

## 2010-05-23 NOTE — Progress Notes (Signed)
Summary: med for back pain-  Phone Note Call from Patient Call back at Home Phone [phone]   Caller: Patient Summary of Call: Patient left msg on voicemail would like to know if it's ok that she takes advil for her back pain or does Dr. Beverely Low have any other preferences. Initial call taken by: Doristine Devoid CMA,  April 02, 2010 8:54 AM  Follow-up for Phone Call        advil would be fine.  if that is not enough and she needs something stronger please just have her call Follow-up by: Neena Rhymes MD,  April 02, 2010 9:00 AM  Additional Follow-up for Phone Call Additional follow up Details #1::        left message on machine .....Marland KitchenMarland KitchenDoristine Devoid CMA  April 02, 2010 11:54 AM   Discuss with patient, will call if she need something stronger............Marland KitchenFelecia Deloach CMA  April 02, 2010 2:34 PM

## 2010-05-23 NOTE — Letter (Signed)
Summary: [facility]  [facility]   Imported By: Lanelle Bal 04/03/2010 15:54:22  _____________________________________________________________________  External Attachment:    Type:   Image     Comment:   External Document

## 2010-05-23 NOTE — Assessment & Plan Note (Signed)
Summary: remove staples/kn   Vital Signs:  Patient profile:   77 year old female Weight:      145 pounds BMI:     19.74 Pulse rate:   90 / minute BP sitting:   90 / 60  (left arm)  Vitals Entered By: Doristine Devoid CMA (March 27, 2010 3:18 PM) CC: remove staples    History of Present Illness: 77 yo woman here today for  1) staple removal- had head lac 10 days ago.  no drainage from wound, no pain.  hair matted and large scab in place- pt has been avoiding washing her hair.  2) abd pain- bilateral lower quadrant pain, extending into pelvis.  had CT of abd in August revealed normal abd/pelvis.  pain present x2 weeks.  hx of similar in past.  pain will travel around abdomen.  nothing improves pain (BM, eating, etc).  nothing worsens pain except constipatin.  intermittant nausea.  will improve w/ saltines.  not eating regularly.  reports she is more nauseated when stomach is empty.  pt again throwing out list of sxs w/out giving additional information or allowing f/u questions.  had to stop pt and have discussion that this is not the way to have an OV.  told her she would leave office feeling unattended to or that i didn't hear her and i would leave feeling frazzled and disorganized.  told her she would have to pick 1-3 things that were concerning to her and we would be able to address those in an organized fashion.  she seemed to understand this.  Preventive Screening-Counseling & Management  Alcohol-Tobacco     Smoking Status: quit  Current Medications (verified): 1)  Calcium (Unknown Dosage) .... Take 1 Tablet By Mouth Once A Day 2)  Fish Oil 1000 Mg Caps (Omega-3 Fatty Acids) .... Take 1 Capsule By Mouth Once A Day 3)  Multivitamins  Tabs (Multiple Vitamin) .... Take 1 Tablet By Mouth Once A Day 4)  Imodium A-D 2 Mg Tabs (Loperamide Hcl) .... Take 1 Tablet By Mouth Two Times A Day 5)  Citalopram Hydrobromide 20 Mg Tabs (Citalopram Hydrobromide) .... Take 1 Tablet By Mouth Once A  Day 6)  Ventolin Hfa 108 (90 Base) Mcg/act Aers (Albuterol Sulfate) .... 2 Puffs Qid As Needed 7)  Omeprazole 20 Mg Cpdr (Omeprazole) .... Take One Tablet Daily  Allergies (verified): 1)  ! Amoxicillin 2)  ! * Ciprofloxacin  Past History:  Past medical, surgical, family and social histories (including risk factors) reviewed for relevance to current acute and chronic problems.  Past Medical History: Reviewed history from 04/02/2009 and no changes required. 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 Fib Recurrent UTIs  Past Surgical History: Reviewed history from 02/14/2008 and no changes required. 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 No FH of Colon Cancer: asthma: brother cancer: father (throat)  Lupus Two Daughters  Social History: Reviewed history from 01/19/2009 and no changes required. widowed. pt has children. retired.  previously worked as a Product/process development scientist. Patient is a former smoker.  started at age 44.  1 1/2 ppd.  quit 1982. Alcohol Use - no  Review of Systems      See HPI  Physical Exam  General:  Well developed, well nourished, no acute distress. Head:  head lac well approximated, well healed.  6 staples removed  w/out difficulty.  tried to remove excess scab for pt by combing over the area Abdomen:  soft, diffusely tender, no rebound, + voluntary guarding.  + BSx4 Pulses:  +2 carotid, radial, DP Extremities:  no C/C/E Psych:  again anxious   Impression & Recommendations:  Problem # 1:  ABDOMINAL PAIN (ICD-789.00) Assessment New pt's diffuse abdominal pain and hx of extensive abdominal surgery concerning for possible obstruction, ileus, or other intra-abdominal abnormality.  will get CT scan today and determine  next step. Orders: Radiology Referral (Radiology)  Problem # 2:  LACERATION, SCALP (ICD-873.0) Assessment: New staples removed w/out complication.  wound care reviewed w/ pt.  again encouraged her to wash her hair.  Complete Medication List: 1)  Calcium (unknown Dosage)  .... Take 1 tablet by mouth once a day 2)  Fish Oil 1000 Mg Caps (Omega-3 fatty acids) .... Take 1 capsule by mouth once a day 3)  Multivitamins Tabs (Multiple vitamin) .... Take 1 tablet by mouth once a day 4)  Imodium A-d 2 Mg Tabs (Loperamide hcl) .... Take 1 tablet by mouth two times a day 5)  Citalopram Hydrobromide 20 Mg Tabs (Citalopram hydrobromide) .... Take 1 tablet by mouth once a day 6)  Ventolin Hfa 108 (90 Base) Mcg/act Aers (Albuterol sulfate) .... 2 puffs qid as needed 7)  Omeprazole 20 Mg Cpdr (Omeprazole) .... Take one tablet daily  Patient Instructions: 1)  Please go to the [facility] on [address] to get your CT scan.  Turn R on [address], turn [address] on [address] and R on [address] on [address].  Enter at [facility] 2)  [facility] notify you of your results 3)  Try and eat frequently to avoid your stomach getting empty 4)  Please call Dr Russella Dar if your symptoms continue or worsen 5)  Continue the Omeprazole 6)  Wash your hair normally now that the staples are out 7)  Call with any questions or concerns 8)  Hang in there!   Orders Added: 1)  Radiology Referral [Radiology] 2)  Est. Patient Level III [[address]]

## 2010-05-23 NOTE — Letter (Signed)
Summary: Patient Notice- Colon Biospy Results  [facility]  [address] [place], [place] [address]   Phone: [phone]  Fax: [phone]        May 08, 2009 MRN: [id number]    [facility] [address] [place], [place]  [address]    Dear Ms. Peeples,  I am pleased to inform you that the biopsies taken during your recent colonoscopy did not show any evidence of cancer upon pathologic examination. The biopsies were normal.  Continue with the treatment plan as outlined on the day of your      exam.  Please call us if you are having persistent problems or have questions about your condition that have not been fully answered at this time.  Sincerely,  Meryl Dare MD [facility]  This letter has been electronically signed by your physician.  Appended Document: Patient Notice- Colon Biospy Results Letter mailed 1.21.11

## 2010-05-23 NOTE — Assessment & Plan Note (Signed)
Summary: new to est/kn   Vital Signs:  Patient profile:   77 year old female Height:      72 inches Weight:      153 pounds BMI:     20.83 Pulse rate:   72 / minute Pulse (ortho):   70 / minute BP sitting:   100 / 74  (left arm) BP standing:   112 / 70  Vitals Entered By: Doristine Devoid CMA (March 22, 2010 3:28 PM)  Serial Vital Signs/Assessments:  Time      Position  BP       Pulse  Resp  Temp     By           Lying RA  110/70   60                    Chemira Jones CMA           Sitting   102/74   76                    Chemira Jones CMA           Standing  112/70   70                    Chemira Jones CMA  CC: NEW EST- f/u fell and hit head last week after dizzy spell    History of Present Illness: 77 yo woman here today to establish care.    1) fall- pt fell walking down her porch steps and hit the back of her head.  reports she 'blacked out temporarily'.  neighbor reports pt told her she was dizzy when she fell.  had head CT w/out acute abnormality.  has 10 staples in head currently- due to come out next wednesday.  has f/u w/ Dr Jacinto Halim and carotid dopplers upcoming.  2) Constipation- pt w/out BM since Sunday.  hx of bowel obstruction w/ colon resection.  was given hydrocodone in the ER but hasn't been taking them.  took stool softeners and suppositories w/out relief.  now having abd pain.  pt reports having chills.  3) Afib- sees Dr Jacinto Halim.  reports 'i don't have this anymore'.  not on meds.  4) Recurrent UTIs- Dr Vernie Ammons  5) Depression/Anxiety- Sees Dr Lolly Mustache  6) Back Pain- sees chiropractic (Dr Lucretia Field)  Dr Laney Pastor- Derm Dr Stephannie Li- ophtho Dr Lindie Spruce- surgery  Preventive Screening-Counseling & Management  Alcohol-Tobacco     Alcohol drinks/day: <1  Caffeine-Diet-Exercise     Does Patient Exercise: yes     Type of exercise: walking      Drug Use:  never.    Current Medications (verified): 1)  Calcium (Unknown Dosage) .... Take 1 Tablet By Mouth Once A  Day 2)  Fish Oil 1000 Mg Caps (Omega-3 Fatty Acids) .... Take 1 Capsule By Mouth Once A Day 3)  Multivitamins  Tabs (Multiple Vitamin) .... Take 1 Tablet By Mouth Once A Day 4)  Imodium A-D 2 Mg Tabs (Loperamide Hcl) .... Take 1 Tablet By Mouth Two Times A Day 5)  Citalopram Hydrobromide 20 Mg Tabs (Citalopram Hydrobromide) .... Take 1 Tablet By Mouth Once A Day 6)  Ventolin Hfa 108 (90 Base) Mcg/act Aers (Albuterol Sulfate) .... 2 Puffs Qid As Needed 7)  Omeprazole 20 Mg Cpdr (Omeprazole) .... Take One Tablet Daily  Allergies (verified): 1)  ! Amoxicillin 2)  ! * Ciprofloxacin  Past History:  Past medical,  surgical, family and social histories (including risk factors) reviewed, and no changes noted (except as noted below).  Past Medical History: Reviewed history from 04/02/2009 and no changes required. 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 Fib Recurrent UTIs  Past Surgical History: Reviewed history from 02/14/2008 and no changes required. 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 No FH of Colon Cancer: asthma: brother cancer: father (throat)  Lupus Two Daughters  Social History: Reviewed history from 01/19/2009 and no changes required. widowed. lives alone pt has children- 1 in [place], 1 in [place]. retired.  previously worked as a Product/process development scientist. Patient is a former smoker.  started at age 54.  1 1/2 ppd.  quit 1982. Alcohol Use - no Does Patient Exercise:  yes Drug Use:  never  Review of Systems      See HPI  Physical Exam  General:  Well developed, well nourished, no acute distress. Head:  6 staples (not 10) in place over L occiput.  dried, matted blood.  laceration well approximated and well healing Eyes:  PERRL, EOMI, fundi  difficult to assess but no obvious abnormality Ears:  External ear exam shows no significant lesions or deformities.  Otoscopic examination reveals clear canals, tympanic membranes are intact bilaterally without bulging, retraction, inflammation or discharge. Hearing is grossly normal bilaterally. Neck:  no obvious masses  Lungs:  Normal respiratory effort, chest expands symmetrically. Lungs are clear to auscultation, no crackles or wheezes. Heart:  Normal rate and regular rhythm. S1 and S2 normal without gallop, murmur, click, rub or other extra sounds. Abdomen:  Soft, nontender and nondistended. No masses, hepatosplenomegaly or hernias noted. Normal bowel sounds. Pulses:  +2 carotid, radial, DP Extremities:  no C/C/E Neurologic:  alert & oriented X3, cranial nerves II-XII intact, and strength normal in all extremities.  gait unsteady and shuffling- uses arm on wall to steady herself Skin:  Intact without suspicious lesions or rashes Cervical Nodes:  No lymphadenopathy noted Psych:  very anxious- talks almost as if flight of ideas, w/out waiting for response.  lists many sxs w/out pausing to answer questions about these sxs.   Impression & Recommendations:  Problem # 1:  DIZZINESS (ICD-780.4) Assessment New pt reported to neighbor that she was dizzy prior to falling.  has shuffling unsteady gait in office, needing to frequently use her arms to steady herself.  not orthostatic on exam.  labs done in ER normal.  head CT unremarkable.  refer to neuro for complete evaluation.  has carotid dopplers scheduled. Orders: Venipuncture (08657) Specimen Handling (84696) Neurology Referral (Neuro)  Problem # 2:  CONSTIPATION (ICD-564.00) Assessment: New given duration w/out BM suggested pt use Fleets enema or Milk of Magnesia to move bowels.  then encouraged pt to take miralax daily to avoid constipation and to have BMs w/out the large 'explosion' caused by stimulant laxatives.  Problem # 3:  ATRIAL  FIBRILLATION (ICD-427.31)  pt reports she no longer has this problem.  EKG confirms this.  encouraged her to f/u w/ Dr Jacinto Halim to discuss dizziness.  Orders: EKG w/ Interpretation (93000)  Problem # 4:  ANXIETY (ICD-300.00) pt is very anxious in office today.  wants reassurances on health that i am not able to provide.  discussed possible counseling.  pt not interested at this time.  will follow. Her updated medication list for this problem includes:  Citalopram Hydrobromide 20 Mg Tabs (Citalopram hydrobromide) .Marland Kitchen... Take 1 tablet by mouth once a day  Complete Medication List: 1)  Calcium (unknown Dosage)  .... Take 1 tablet by mouth once a day 2)  Fish Oil 1000 Mg Caps (Omega-3 fatty acids) .... Take 1 capsule by mouth once a day 3)  Multivitamins Tabs (Multiple vitamin) .... Take 1 tablet by mouth once a day 4)  Imodium A-d 2 Mg Tabs (Loperamide hcl) .... Take 1 tablet by mouth two times a day 5)  Citalopram Hydrobromide 20 Mg Tabs (Citalopram hydrobromide) .... Take 1 tablet by mouth once a day 6)  Ventolin Hfa 108 (90 Base) Mcg/act Aers (Albuterol sulfate) .... 2 puffs qid as needed 7)  Omeprazole 20 Mg Cpdr (Omeprazole) .... Take one tablet daily  Patient Instructions: 1)  Please schedule a follow up visit on Wed to remove your staples 2)  We'll notify you of your lab results 3)  Someone will call you with your Neuro appt 4)  Please change your stress test to a regular appt w/ Dr Jacinto Halim to discuss your dizziness 5)  Go ahead and do your carotid dopplers (ultrasound of the neck) 6)  Please either use Milk of Magnesia or a Fleets Enema to move your bowels 7)  If your abdominal pain worsens, you remain unable to have a bowel movement, you again have dizziness or other concerns- please go to the ER 8)  Hang in there!!!   Orders Added: 1)  Venipuncture [04540] 2)  Specimen Handling [99000] 3)  Neurology Referral [Neuro] 4)  New Patient Level III [98119] 5)  EKG w/ Interpretation  [93000]

## 2010-05-23 NOTE — Progress Notes (Signed)
Summary: Triage   Phone Note Call from Patient Call back at Home Phone [phone]   Caller: Patient Call For: Dr. Russella Dar Reason for Call: Talk to Nurse Summary of Call: Pt does not understand why she was prescribed Metronidazole and it is giving her a "stiff" neck Initial call taken by: Karna Christmas,  May 04, 2009 8:04 AM  Follow-up for Phone Call        Left message for patient to call back Darcey Nora RN, [facility]  May 04, 2009 9:57 AM  I spoke with the patient she is having 1 -2 formed stools a day.  She has occasional stiff neck and a sore throat that is improved with tylenol.  She does have a call into her primary care about being seen today.  I discussed with Mike Gip PA she advised to hold metrodidazole until her colon on Monday when she can discuss further with Dr Russella Dar . Follow-up by: Darcey Nora RN, CGRN,  May 04, 2009 11:42 AM  Additional Follow-up for Phone Call Additional follow up Details #1::        OK. Additional Follow-up by: Meryl Dare MD Clementeen Graham,  May 06, 2009 6:07 PM

## 2010-05-25 ENCOUNTER — Encounter: Payer: Self-pay | Admitting: Internal Medicine

## 2010-05-27 ENCOUNTER — Encounter: Payer: Self-pay | Admitting: Family Medicine

## 2010-05-29 ENCOUNTER — Encounter (HOSPITAL_COMMUNITY): Payer: Self-pay | Admitting: Licensed Clinical Social Worker

## 2010-05-31 ENCOUNTER — Encounter: Payer: Self-pay | Admitting: Family Medicine

## 2010-06-05 ENCOUNTER — Encounter (HOSPITAL_COMMUNITY): Payer: Medicare Other | Admitting: Licensed Clinical Social Worker

## 2010-06-05 ENCOUNTER — Encounter (HOSPITAL_COMMUNITY): Payer: Self-pay | Admitting: Licensed Clinical Social Worker

## 2010-06-05 DIAGNOSIS — F411 Generalized anxiety disorder: Secondary | ICD-10-CM

## 2010-06-06 NOTE — Letter (Signed)
Summary: Application for Handicapped Placard  Application for Handicapped Placard   Imported By: Maryln Gottron 05/31/2010 15:25:14  _____________________________________________________________________  External Attachment:    Type:   Image     Comment:   External Document

## 2010-06-12 ENCOUNTER — Encounter (HOSPITAL_COMMUNITY): Payer: Self-pay | Admitting: Psychiatry

## 2010-06-12 ENCOUNTER — Encounter (HOSPITAL_COMMUNITY): Payer: Medicare Other | Admitting: Psychiatry

## 2010-06-12 ENCOUNTER — Encounter (HOSPITAL_COMMUNITY): Payer: Self-pay | Admitting: Licensed Clinical Social Worker

## 2010-06-12 DIAGNOSIS — F411 Generalized anxiety disorder: Secondary | ICD-10-CM

## 2010-06-12 NOTE — Letter (Signed)
Summary: [facility]  [facility]   Imported By: Maryln Gottron 06/06/2010 09:06:34  _____________________________________________________________________  External Attachment:    Type:   Image     Comment:   External Document

## 2010-06-19 ENCOUNTER — Encounter (INDEPENDENT_AMBULATORY_CARE_PROVIDER_SITE_OTHER): Payer: Self-pay | Admitting: *Deleted

## 2010-06-21 ENCOUNTER — Ambulatory Visit: Payer: Medicare Other | Attending: Diagnostic Neuroimaging | Admitting: Physical Therapy

## 2010-06-21 DIAGNOSIS — IMO0001 Reserved for inherently not codable concepts without codable children: Secondary | ICD-10-CM | POA: Insufficient documentation

## 2010-06-21 DIAGNOSIS — G20A1 Parkinson's disease without dyskinesia, without mention of fluctuations: Secondary | ICD-10-CM | POA: Insufficient documentation

## 2010-06-21 DIAGNOSIS — G20 Parkinson's disease: Secondary | ICD-10-CM | POA: Insufficient documentation

## 2010-06-21 DIAGNOSIS — R269 Unspecified abnormalities of gait and mobility: Secondary | ICD-10-CM | POA: Insufficient documentation

## 2010-06-24 ENCOUNTER — Ambulatory Visit: Payer: Medicare Other | Admitting: Physical Therapy

## 2010-06-25 ENCOUNTER — Ambulatory Visit: Payer: Medicare Other | Admitting: Physical Therapy

## 2010-06-27 ENCOUNTER — Ambulatory Visit: Payer: Medicare Other | Admitting: Physical Therapy

## 2010-06-27 ENCOUNTER — Encounter (HOSPITAL_COMMUNITY): Payer: Medicare Other | Admitting: Licensed Clinical Social Worker

## 2010-06-27 DIAGNOSIS — F411 Generalized anxiety disorder: Secondary | ICD-10-CM

## 2010-06-27 NOTE — Miscellaneous (Signed)
   Clinical Lists Changes  Medications: Rx of VENTOLIN HFA 108 (90 BASE) MCG/ACT AERS (ALBUTEROL SULFATE) 2 puffs qid as needed;  #1 x 3;  Signed;  Entered by: Doristine Devoid CMA;  Authorized by: Neena Rhymes MD;  Method used: Electronically to [facility] [address] [place], [place], [place]  [address], Ph: [phone], Fax: [phone]    Prescriptions: VENTOLIN HFA 108 (90 BASE) MCG/ACT AERS (ALBUTEROL SULFATE) 2 puffs qid as needed  #1 x 3   Entered by:   Doristine Devoid CMA   Authorized by:   Neena Rhymes MD   Signed by:   Doristine Devoid CMA on 06/19/2010   Method used:   Electronically to        [facility] (retail)       [address]       [place], [place]  [address]       Ph: [phone]       Fax: [phone]   RxID:   [id number]

## 2010-07-03 ENCOUNTER — Ambulatory Visit: Payer: Medicare Other | Admitting: Physical Therapy

## 2010-07-04 ENCOUNTER — Ambulatory Visit: Payer: Medicare Other | Admitting: Physical Therapy

## 2010-07-05 ENCOUNTER — Ambulatory Visit: Payer: Medicare Other | Admitting: Physical Therapy

## 2010-07-05 LAB — HEPATIC FUNCTION PANEL
Alkaline Phosphatase: 66 U/L (ref 39–117)
Bilirubin, Direct: 0.2 mg/dL (ref 0.0–0.3)
Indirect Bilirubin: 0.5 mg/dL (ref 0.3–0.9)
Total Protein: 6.8 g/dL (ref 6.0–8.3)

## 2010-07-05 LAB — DIFFERENTIAL
Eosinophils Absolute: 0.2 10*3/uL (ref 0.0–0.7)
Eosinophils Relative: 1 % (ref 0–5)
Lymphs Abs: 0.8 10*3/uL (ref 0.7–4.0)
Monocytes Absolute: 0.6 10*3/uL (ref 0.1–1.0)

## 2010-07-05 LAB — URINALYSIS, ROUTINE W REFLEX MICROSCOPIC
Bilirubin Urine: NEGATIVE
Glucose, UA: NEGATIVE mg/dL
Specific Gravity, Urine: 1.012 (ref 1.005–1.030)
pH: 7 (ref 5.0–8.0)

## 2010-07-05 LAB — CBC
HCT: 34.8 % — ABNORMAL LOW (ref 36.0–46.0)
MCH: 31.7 pg (ref 26.0–34.0)
MCV: 92.1 fL (ref 78.0–100.0)
Platelets: 241 10*3/uL (ref 150–400)
RBC: 3.78 MIL/uL — ABNORMAL LOW (ref 3.87–5.11)
RDW: 14.3 % (ref 11.5–15.5)

## 2010-07-05 LAB — URINE CULTURE
Colony Count: >=100000
Culture  Setup Time: 201108281945

## 2010-07-05 LAB — BASIC METABOLIC PANEL
BUN: 7 mg/dL (ref 6–23)
CO2: 28 mEq/L (ref 19–32)
Chloride: 97 mEq/L (ref 96–112)
Creatinine, Ser: 0.72 mg/dL (ref 0.4–1.2)

## 2010-07-05 LAB — URINE MICROSCOPIC-ADD ON

## 2010-07-08 ENCOUNTER — Ambulatory Visit: Payer: Medicare Other | Admitting: Physical Therapy

## 2010-07-10 ENCOUNTER — Ambulatory Visit: Payer: Medicare Other | Admitting: Physical Therapy

## 2010-07-11 ENCOUNTER — Encounter (HOSPITAL_COMMUNITY): Payer: Medicare Other | Admitting: Licensed Clinical Social Worker

## 2010-07-11 DIAGNOSIS — F411 Generalized anxiety disorder: Secondary | ICD-10-CM

## 2010-07-12 ENCOUNTER — Ambulatory Visit: Payer: Medicare Other | Admitting: Physical Therapy

## 2010-07-15 ENCOUNTER — Ambulatory Visit: Payer: Medicare Other | Admitting: Physical Therapy

## 2010-07-17 ENCOUNTER — Ambulatory Visit: Payer: Medicare Other | Admitting: Physical Therapy

## 2010-07-19 ENCOUNTER — Ambulatory Visit: Payer: Medicare Other | Admitting: Physical Therapy

## 2010-07-23 ENCOUNTER — Ambulatory Visit: Payer: Medicare Other | Attending: Family Medicine | Admitting: Physical Therapy

## 2010-07-23 DIAGNOSIS — IMO0001 Reserved for inherently not codable concepts without codable children: Secondary | ICD-10-CM | POA: Insufficient documentation

## 2010-07-23 DIAGNOSIS — G20A1 Parkinson's disease without dyskinesia, without mention of fluctuations: Secondary | ICD-10-CM | POA: Insufficient documentation

## 2010-07-23 DIAGNOSIS — G20 Parkinson's disease: Secondary | ICD-10-CM | POA: Insufficient documentation

## 2010-07-23 DIAGNOSIS — R269 Unspecified abnormalities of gait and mobility: Secondary | ICD-10-CM | POA: Insufficient documentation

## 2010-07-24 ENCOUNTER — Encounter (HOSPITAL_BASED_OUTPATIENT_CLINIC_OR_DEPARTMENT_OTHER): Payer: Medicare Other | Admitting: Licensed Clinical Social Worker

## 2010-07-24 DIAGNOSIS — F411 Generalized anxiety disorder: Secondary | ICD-10-CM

## 2010-07-24 LAB — URINE MICROSCOPIC-ADD ON

## 2010-07-24 LAB — BASIC METABOLIC PANEL
CO2: 28 mEq/L (ref 19–32)
Chloride: 100 mEq/L (ref 96–112)
Chloride: 95 mEq/L — ABNORMAL LOW (ref 96–112)
GFR calc Af Amer: >60 mL/min (ref >60)
GFR calc Af Amer: >60 mL/min (ref >60)
GFR calc non Af Amer: >60 mL/min (ref >60)
Glucose, Bld: 105 mg/dL — ABNORMAL HIGH (ref 70–99)
Potassium: 3.7 mEq/L (ref 3.5–5.1)
Sodium: 138 mEq/L (ref 135–145)
Sodium: 131 mEq/L — ABNORMAL LOW (ref 135–145)

## 2010-07-24 LAB — CBC
HCT: 34.2 % — ABNORMAL LOW (ref 36.0–46.0)
HCT: 35.2 % — ABNORMAL LOW (ref 36.0–46.0)
Hemoglobin: 11.9 g/dL — ABNORMAL LOW (ref 12.0–15.0)
MCHC: 33.9 g/dL (ref 30.0–36.0)
MCV: 93.5 fL (ref 78.0–100.0)
MCV: 93.2 fL (ref 78.0–100.0)
RBC: 3.65 MIL/uL — ABNORMAL LOW (ref 3.87–5.11)
RBC: 3.77 MIL/uL — ABNORMAL LOW (ref 3.87–5.11)
RDW: 13 % (ref 11.5–15.5)
WBC: 9 10*3/uL (ref 4.0–10.5)

## 2010-07-24 LAB — DIFFERENTIAL
Basophils Absolute: 0 10*3/uL (ref 0.0–0.1)
Basophils Relative: 0 % (ref 0–1)
Eosinophils Absolute: 0.1 10*3/uL (ref 0.0–0.7)
Eosinophils Absolute: 0 10*3/uL (ref 0.0–0.7)
Eosinophils Relative: 1 % (ref 0–5)
Eosinophils Relative: 0 % (ref 0–5)
Lymphocytes Relative: 23 % (ref 12–46)
Lymphs Abs: 2 10*3/uL (ref 0.7–4.0)
Monocytes Absolute: 0.6 10*3/uL (ref 0.1–1.0)
Monocytes Relative: 6 % (ref 3–12)
Monocytes Relative: 9 % (ref 3–12)

## 2010-07-24 LAB — URINE CULTURE: Culture: NO GROWTH

## 2010-07-24 LAB — URINALYSIS, ROUTINE W REFLEX MICROSCOPIC
Bilirubin Urine: NEGATIVE
Glucose, UA: NEGATIVE mg/dL
Ketones, ur: NEGATIVE mg/dL
Ketones, ur: NEGATIVE mg/dL
Nitrite: NEGATIVE
Protein, ur: NEGATIVE mg/dL
Protein, ur: NEGATIVE mg/dL
Urobilinogen, UA: 0.2 mg/dL (ref 0.0–1.0)
pH: 5.5 (ref 5.0–8.0)
pH: 7.5 (ref 5.0–8.0)

## 2010-07-25 ENCOUNTER — Ambulatory Visit: Payer: Medicare Other | Admitting: Physical Therapy

## 2010-07-28 LAB — LIPID PANEL: Cholesterol: 159 mg/dL (ref 0–200)

## 2010-07-28 LAB — CBC
Hemoglobin: 12.5 g/dL (ref 12.0–15.0)
MCHC: 34.6 g/dL (ref 30.0–36.0)
Platelets: 185 10*3/uL (ref 150–400)
RDW: 14.1 % (ref 11.5–15.5)

## 2010-07-28 LAB — DIFFERENTIAL
Basophils Absolute: 0 10*3/uL (ref 0.0–0.1)
Basophils Relative: 0 % (ref 0–1)
Lymphocytes Relative: 33 % (ref 12–46)
Monocytes Relative: 9 % (ref 3–12)
Neutro Abs: 3.4 10*3/uL (ref 1.7–7.7)
Neutrophils Relative %: 56 % (ref 43–77)

## 2010-07-28 LAB — CARDIAC PANEL(CRET KIN+CKTOT+MB+TROPI)
CK, MB: 1 ng/mL (ref 0.3–4.0)
Relative Index: INVALID (ref 0.0–2.5)
Total CK: 44 U/L (ref 7–177)
Troponin I: 0.04 ng/mL (ref 0.00–0.06)

## 2010-07-28 LAB — COMPREHENSIVE METABOLIC PANEL
Alkaline Phosphatase: 72 U/L (ref 39–117)
BUN: 11 mg/dL (ref 6–23)
Creatinine, Ser: 0.99 mg/dL (ref 0.4–1.2)
Glucose, Bld: 66 mg/dL — ABNORMAL LOW (ref 70–99)
Potassium: 4 mEq/L (ref 3.5–5.1)
Total Bilirubin: 0.7 mg/dL (ref 0.3–1.2)
Total Protein: 6.3 g/dL (ref 6.0–8.3)

## 2010-07-28 LAB — TROPONIN I: Troponin I: 0.07 ng/mL — ABNORMAL HIGH (ref 0.00–0.06)

## 2010-07-28 LAB — CK TOTAL AND CKMB (NOT AT ARMC)
CK, MB: 1.5 ng/mL (ref 0.3–4.0)
Relative Index: INVALID (ref 0.0–2.5)
Total CK: 52 U/L (ref 7–177)
Total CK: 69 U/L (ref 7–177)

## 2010-07-28 LAB — PROTIME-INR
INR: 1.1 (ref 0.00–1.49)
Prothrombin Time: 14.8 seconds (ref 11.6–15.2)

## 2010-07-28 LAB — APTT: aPTT: 31 seconds (ref 24–37)

## 2010-07-29 ENCOUNTER — Ambulatory Visit: Payer: Medicare Other | Admitting: Physical Therapy

## 2010-07-31 ENCOUNTER — Ambulatory Visit: Payer: Medicare Other | Admitting: Physical Therapy

## 2010-08-02 ENCOUNTER — Ambulatory Visit: Payer: Medicare Other | Admitting: Physical Therapy

## 2010-08-05 ENCOUNTER — Ambulatory Visit: Payer: Medicare Other | Admitting: Physical Therapy

## 2010-08-07 ENCOUNTER — Encounter (HOSPITAL_COMMUNITY): Payer: Medicare Other | Admitting: Licensed Clinical Social Worker

## 2010-08-07 ENCOUNTER — Ambulatory Visit: Payer: Medicare Other | Admitting: Physical Therapy

## 2010-08-09 ENCOUNTER — Ambulatory Visit: Payer: Medicare Other | Admitting: Physical Therapy

## 2010-08-09 ENCOUNTER — Encounter (HOSPITAL_COMMUNITY): Payer: Medicare Other | Admitting: Psychiatry

## 2010-08-09 DIAGNOSIS — F411 Generalized anxiety disorder: Secondary | ICD-10-CM

## 2010-08-12 ENCOUNTER — Ambulatory Visit: Payer: Medicare Other | Admitting: Physical Therapy

## 2010-08-14 ENCOUNTER — Ambulatory Visit: Payer: Medicare Other | Admitting: Physical Therapy

## 2010-08-16 ENCOUNTER — Ambulatory Visit: Payer: Medicare Other | Admitting: Physical Therapy

## 2010-08-26 ENCOUNTER — Encounter (HOSPITAL_COMMUNITY): Payer: Medicare Other | Admitting: Licensed Clinical Social Worker

## 2010-08-30 ENCOUNTER — Encounter (HOSPITAL_COMMUNITY): Payer: Medicare Other | Admitting: Licensed Clinical Social Worker

## 2010-09-03 NOTE — H&P (Signed)
NAMERUMOR, SUN NO.:  [id number]   MEDICAL RECORD NO.:  [id number]          PATIENT TYPE:  INP   LOCATION:  4742                         FACILITY:  [facility]   PHYSICIAN:  Cherylynn Ridges, M.D.    DATE OF BIRTH:  1927/09/23   DATE OF ADMISSION:  02/25/2007  DATE OF DISCHARGE:                              HISTORY & PHYSICAL   IDENTIFICATION AND CHIEF COMPLAINT:  The patient is a 77 year old female  with 4-5 days of abdominal pain and now with peritonitis who is being  taken to the operating room for an exploratory laparotomy.   I was contacted by Dr. Tamsen Roers of the [facility]  because of a patient with abdominal pain.  She was noted to have what  they thought was a small-bowel obstruction on a CT scan done at an  outside institution and I was asked to evaluate the patient although the  medicine service was willing to admit her.   When I met the patient she told me she had a 4- to 5-day history of  abdominal pain and had been seen at [facility] emergency room 3-4 days  ago with abdominal pain and was diagnosed as having a UTI.  Examining  those results, she had large amounts of epithelial cells, leukocyte  esterase large, and many bacteria.  She was started on ciprofloxacin and  also Phenergan for nausea/vomiting, which she was unable to take because  of continued vomiting.   She came into the emergency room today with continued abdominal pain and  had a CT scan done demonstrating what they thought was a bowel  obstruction.  Also noted on the CT scan was that the patient had a large  amount of what they called ascites.  On my examination, she had  peritonitis, and that combined with the questionable ascites, she was  felt to have some significant intra-abdominal pathology and taken to the  operating room.   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  Other surgery:  She  has had  vein stripping bilaterally.  Past medical history is significant for  only hypercholesterolemia.  She takes Lipitor.  Also takes MiraLax and  Metamucil daily.  She is allergic to AMOXICILLIN which makes her have a  rash and hives, and also CIPROFLOXACIN with a similar response.   REVIEW OF SYSTEMS:  She has had no bowel movement for several days, no  gas.  She has difficulty having bowel movements but no blood in her  stools recently.  Persistent nausea/vomiting all day and currently has  hiccups.   EXAMINATION:  She is afebrile.  Vital signs are stable.  She is  normocephalic and atraumatic and anicteric.  She does not appear to be  in acute distress except when she moves when she has a considerable  amount of abdominal pain.  LUNGS:  Clear.  CARDIAC EXAM:  Regular rhythm and rate but she does have a history of  atrial fibrillation which was treated with Digitek in the past, no  longer as she converted.  ABDOMEN:  Distended and quiet.  She has diffuse tenderness with tap and  cough tenderness but no heel-tapping tenderness.  She is distended above  her usual size.  RECTAL AND PELVIC EXAMS:  Not performed.  Cranial nerves II-XII are grossly intact.  She had intact pulses  bilaterally.   LABORATORY STUDIES:  Demonstrated a normal hemoglobin and a white count  12.3.  UA still appeared to be abnormal with possibility of UTI.  Chest  x-ray is unremarkable.  An EKG showed a few PACs.   IMPRESSION:  Based on her examination with peritonitis and the free  fluid designated as ascites, but on examination possibly to represent  enteric contents with her significant tenderness, I feel as though she  requires an exploratory laparotomy.  I do not think we need any further  testing prior to exploration.  So the plan is to take her to the  operating room.  With her history of allergies to AMOXICILLIN and CIPRO  I.V. I gave the patient cefoxitin and cephalosporin, and she is going to   the OR as soon as possible.  I explained this to the patient and her  friend, and they understand the need to go ahead and explore.      Cherylynn Ridges, M.D.  Electronically Signed     JOW/MEDQ  D:  02/25/2007  T:  02/26/2007  Job:  [id number]   cc:   Valma Cava, M.D.

## 2010-09-03 NOTE — H&P (Signed)
[name], [name]           ACCOUNT NO.:  [id number]   MEDICAL RECORD NO.:  [id number]          PATIENT TYPE:  INP   LOCATION:  4742                         FACILITY:  [facility]   PHYSICIAN:  Beckey Rutter, MD  DATE OF BIRTH:  May 14, 1927   DATE OF ADMISSION:  02/25/2007  DATE OF DISCHARGE:                              HISTORY & PHYSICAL   CHIEF COMPLAINT:  Vomiting and abdominal pain.   HISTORY OF PRESENT ILLNESS:  This is a 77 year old female who presents  with a chief complaint of abdominal pain.  The history was taken from  the patient who stated that she went [facility] on January 22, 2007 with abdominal discomfort.  At that time, tests were done and the  patient was released home with Bactrim.  The patient's condition  continued to worsen to the degree of abdominal pain 6/10 in intensity  that she wanted to see a physician with [facility].  The CAT scan done  there was showing a small bowel obstruction and the patient was then  referred to the emergency department here at [facility].  The patient  stated that she has similar colon obstruction one and a half years ago  and at that time the obstruction did not require surgery.  The patient  stated that the pain is 6/10 associated with nausea and vomiting and  inability to eat also associated with abdominal distention.  The patient  denies fever, cough or headache.   PAST MEDICAL HISTORY:  Her past medical history is significant for:  1. Anxiety.  2. Atrial fibrillation.  3. Chronic fatigue syndrome.  4. Malignant melanoma that the patient had recurrence on her vagina.  5. Osteoarthritis.  6. Sciatica.  7. Vertigo.   SOCIAL HISTORY:  The patient lives alone.  She is a widower. No drug  abuse.  No ethanol abuse and she is not a smoker.   FAMILY HISTORY:  She could not tell.   MEDICATION ALLERGY:  1. AMOXICILLIN.  2. CIPRO.   MEDICATIONS:  1. Aspirin.  2. Lipitor.  3. Metamucil.  4. MiraLax.  5.  Phenergan.  6. Bactrim.   REVIEW OF SYSTEMS:  Denied fever and headache but admits to having  nausea and vomiting as discussed in the HPI.   PHYSICAL EXAMINATION:  Temperature 98.0, blood pressure is 98/75, pulse  102, respiratory rate is 19.  The patient was lying flat on bed in no acute respiratory distress.  HEENT:  Heart atraumatic, normocephalic.  Eyes PERRL.  Mouth moist.  No  ulcer.  NECK:  Supple.  No JVD.  PRECORDIUM:  Distal heart sounds.  LUNGS:  Bilateral fair air entry.  ABDOMEN:  Distended, tender to palpation. Diminished/sluggish bowel  sounds.  EXTREMITIES:  No lower extremity edema.   LABS AND X-RAYS:  The patient had a CT scan done as an outpatient with  [facility] and the report conclusion was reading 1)  arthritis, 2)small-bowel obstruction, 3) small bilateral pleural  effusion.   I reviewed the disk with our radiologist, Dr. Manson Passey, just to evaluate  the grade of the obstruction and it seems to  be high-grade obstruction  with dilation of a small bowel more than 4 cm in some areas. White blood  count is 28.0, hemoglobin is 12.0, hematocrit is 34.9 and platelet count  is 253. The patient had lab work done on November 3 on her first visit  to the [facility] and the results showing sodium 137,  potassium 4.1, chloride 26, glucose 134, BUN 7, creatinine 0.74, but  again this is an old result on November 3. At that time, her white blood  count was 12.7 and today as mentioned above is 28.  The rest of the lab  work is still pending.   ASSESSMENT AND PLAN:  1. This is a 77 year old female with multiple medical problems      including recurrence of her melanoma who presented today with high-      grade small-bowel obstruction and some ascites. The plan will be to      admit the patient for observation and for further management.  I      already contacted general surgery for evaluation and consultation.  2. The patient will be started on  intravenous hydration when we will      obtain up-to-date labs to evaluate her kidney function and her      liver function.  3. I will order an NG tube with intermittent suction.  The patient has      elevated white blood count now without differential. We will order      differential white blood count and we will start the patient with      antibiotics after the result of her urinalysis and the test as      needed but as of now I will not start the patient on antibiotics.      For GI prophylaxis I will start that the patient on Protonix. For      DVT prophylaxis I will put the patient on sequential pneumatic      device.      Beckey Rutter, MD  Electronically Signed     EME/MEDQ  D:  02/25/2007  T:  02/26/2007  Job:  [id number]

## 2010-09-03 NOTE — Assessment & Plan Note (Signed)
[facility]                                 ON-CALL NOTE   NAME:MAGNUSONDiego, Delancey                    MRN:          [id number]  DATE:05/07/2009                            DOB:          [birth year]    Ms. Guerrier called this evening and stated that she has a low-grade  fever to about 100-100.4.  Except for feeling warm, she is feeling well.  Physically, she is without pain, diarrhea, or bleeding.   She was instructed to take Tylenol and to call the office if she  develops any of the above symptoms.     Barbette Hair. Arlyce Dice, MD,FACG  Electronically Signed    RDK/MedQ  DD: 05/07/2009  DT: 05/08/2009  Job #: [id number]   cc:   Venita Lick. Russella Dar, MD, [facility]

## 2010-09-03 NOTE — Assessment & Plan Note (Signed)
[facility]                         GASTROENTEROLOGY OFFICE NOTE   NAME:[name], [name]                  MRN:          [id number]  DATE:05/26/2007                            DOB:          December 08, 1927    PHYSICIAN REQUESTING CONSULT:  Dr. Murray Hodgkins.   REASON FOR CONSULT:  Iron deficiency anemia, GERD, abnormal CT of the  esophagus.   HISTORY OF PRESENT ILLNESS:  [name] is a 77 year old white female  that I have followed in the past with chronic constipation and colon  polyps. She is status post hospitalization in November 2008 for an  infarcted terminal ileum with strangulation secondary to an obstruction.  She underwent exploratory laparotomy, small bowel resection.  She was  subsequently hospitalized for 5 days in December 2008.  An iron  deficiency anemia with an iron saturation of 7% was noted.  Stool  Hemoccults were negative.  She underwent colonoscopy for followup of  tubulovillous adenomatous colon polyps in June of 2007 and the  colonoscopy showed only internal hemorrhoids.  She underwent endoscopy  previously in September of 2005 for dysphagia and nausea which showed  only mild gastritis with RUT negative.  She relates difficulty  swallowing foods with associated nausea and painful swallowing.  Her  symptoms are present with both solids and liquids.  She has frequent  nausea and belching.  She relates chronic constipation associated with  gas and bloating, and she has recently discontinued all fiber  supplements.  She has noted dark stools since being treated with iron.  She states she has lost weight following her recovery from surgery.  I  have reviewed the November 2008 operative report and the most recent  discharge summary.   PAST MEDICAL HISTORY:  1. Hyperlipidemia.  2. Arthritis.  3. Tubulovillous adenomatous colon polyps.  4. Chronic constipation.  5. Hemorrhoids.  6. Varicose veins.  7. COPD.  8. Depression.  9. Anxiety.  10.Status post appendectomy.  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   CURRENT MEDICATIONS:  Listed on the chart, updated and reviewed.   MEDICATION ALLERGIES:  CIPRO and AMOXICILLIN.   SOCIAL HISTORY/REVIEW OF SYSTEMS:  Per the handwritten form.   PHYSICAL EXAMINATION:  An anxious, elderly white female.  Height 6 feet, weight 146.2 pounds, blood pressure is 86/52, pulse 76  and regular.  HEENT EXAM:  Anicteric sclerae, oropharynx is clear.  CHEST:  Clear to auscultation bilaterally.  CARDIAC:  Regular rate and rhythm without murmurs.  ABDOMEN:  Soft, nontender, nondistended, normoactive bowel sounds.  No  palpable organomegaly, masses, or hernias.  Her abdominal incision is  well healed.  NEUROLOGIC:  Anxious.  Alert and oriented x3.  Grossly nonfocal.   LABORATORY DATA:  A CT scan dated April 12, 2007 shows a  circumferential thickening of the distal esophagus.  Hemoglobin 11, WBC 7.2, iron saturation 7%.   ASSESSMENT AND PLAN:  1. Iron deficiency anemia with Hemoccult negative stool.  2. Distal esophageal wall thickening on CT scan, dysphagia and reflux      symptoms  3. Chronic constipation  4. Personal  history of adenomatous colon polyps   I suspect she has active reflux symptoms or a motility disorder. She  will begin the standard antireflux diet and omeprazole 20 mg p.o. q.a.m.  Schedule upper endoscopy.  Risks, benefits, and alternatives to upper  endoscopy with possible dilation and possible biopsy discussed with the  patient.  She consents to proceed.  This will be scheduled electively.  She was advised to increase her fiber intake and begin using Metamucil  on a daily basis.  She may continue to use MiraLax 1 to 3 times a day,  titrated for adequate bowel movements.  Surveillance colonoscopy  recommended for June 2012.     Venita Lick. Russella Dar, MD, [facility]  Electronically Signed    MTS/MedQ  DD:  05/26/2007  DT: 05/26/2007  Job #: [id number]   cc:   Lenon Curt. Chilton Si, M.D.

## 2010-09-03 NOTE — H&P (Signed)
NAMECHARLAYNE, VULTAGGIO NO.:  [id number]   MEDICAL RECORD NO.:  [id number]          PATIENT TYPE:  EMS   LOCATION:  MAJO                         FACILITY:  [facility]   PHYSICIAN:  Lucile Crater, MD         DATE OF BIRTH:  1928/04/21   DATE OF ADMISSION:  11/08/2008  DATE OF DISCHARGE:                              HISTORY & PHYSICAL   PRIMARY CARE PHYSICIAN:  Medical laboratory scientific officer.   CHIEF COMPLAINT:  Generalized weakness, chest pain and shortness of  breath.   HISTORY OF PRESENT ILLNESS:  The patient is a 77 year old white female  with past medical history of paroxysmal atrial fibrillation who is not  on anticoagulation.  She complains of shortness of breath and heaviness  in the chest with generalized weakness which started earlier in the  morning of November 08, 2008.  The patient was on her usual morning walk and  after she walked for a mile she noticed that she was extremely short of  breath and was weak all over the body and she also has a pressure like  sensation in the chest, and so she sat down and had some water and  walked back the mile which took her an hour.  She states that usually a  mile takes her 15-20 minutes to complete, but today she was so weak and  short of breath she could not finish the walk.  She went home and laid  down in her [place] until 10 a.m., and she thought she would feel  better but she did not and, therefore, she went to her primary care  office at [facility].  There, her standing blood pressure was  noted to be 64/56, with sitting blood pressure 74/52, her pulse rate was  85 and regular.  There was no O2 saturation obtained.  She was sent to  the [facility] Emergency Room by an ambulance to be evaluated for a  pulmonary embolism because she has a history of A fib and not on  anticoagulation.  The patient's O2 saturations were always well  maintained in the emergency room and a D-dimer was obtained which was  negative.  EKG did  not reveal any changes from her past ones.  The  patient still continues to be weak.  When I examined the patient, her  heart rate was in the low 50s to the high 50s and her blood pressure is  124/82.   REVIEW OF SYSTEMS:  A complete review of systems was done, which  included general, head, eyes, ears, nose, throat, cardiovascular,  respiratory, GI, GU, endocrine, musculoskeletal, skin, neurologic and  psychiatric and all are within normal limits other than what is  mentioned above.   PAST MEDICAL HISTORY:  1. Paroxysmal atrial fibrillation.  2. Small bowel incarceration, status post resection.  3. Anxiety.  4. Panic attacks.  5. Chronic fatigue syndrome.  6. Remote history of malignant melanoma.  7. Hyperlipidemia.  8. Arthritis.  9. Tubulovillous adenomatous colonic polyps.  10.Chronic constipation.  11.Hemorrhoids.  12.Varicose veins.  13.COPD.  14.Gastroesophageal reflux disease.   ALLERGIES:  1. AMOXICILLIN.  2. CIPROFLOXACIN.   CURRENT MEDICATIONS AT HOME:  1. Multivitamin 1 tablet once a day.  2. Metamucil oral powder 2 teaspoons daily.  3. Fish oil concentrate 1000 mg b.i.d.  4. Preparation H topical application.  5. MiraLax 17 grams in 8 ounces of water once a day p.r.n.      constipation.  6. Aspirin 325 mg p.o. daily.  7. Lexapro 10 mg p.o. daily.   SOCIAL HISTORY:  The patient is a widower and lives by herself in her  house.  There is a remote history of smoking.  She quit smoking 30 years  ago.  She has very independent activities of daily living.   FAMILY HISTORY:  Father had a history of throat cancer.   PHYSICAL EXAMINATION:  VITAL SIGNS:  T-max 97.6.  Lowest blood pressure  86/51, current blood pressure 124/84.  Pulse rate 48-67.  O2 saturations  100% on room air and 2 liters as well.  GENERAL APPEARANCE:  Very anxious, not in acute distress.  Oriented to  time, place, person and situation.  HEENT:  Normocephalic, atraumatic.  The pupils are equal  and reactive to  light and accommodation.  Extraocular muscles are intact.  The mucous  membranes are moist.  NECK:  Supple.  No JVD, lymphadenopathy or carotid bruit.  CV:  Bradycardic, regular rhythm.  No murmurs, rubs or gallops.  LUNGS:  Clear to auscultation bilaterally.  EXTREMITIES:  No clubbing, cyanosis or edema.  ABDOMEN:  Benign.  NEUROLOGIC:  Grossly nonfocal.   LABORATORIES AND STUDIES:  WBC 6200, hemoglobin 12.5, hematocrit 36.2,  platelets 185.  D-dimer is 0.34.  Sodium 142, potassium 4.0, chloride  109, bicarb 27, BUN 11, creatinine 0.9 and blood glucose 66.  Total  protein 6.3, albumin 3.4, ALT 16, AST 21, alkaline phosphatase 72, total  bilirubin 0.7.  Cardiac enzymes:  CK 69, CK-MB 2.1, troponin 0.07.  EKG:  Normal sinus rhythm at a rate of 61 beats per minute.  THE-wave  inversions in lead aVL, V1, unchanged compared to the past.  Normal PR  and QT intervals.  Normal QRS morphology.   ASSESSMENT/PLAN:  1. Shortness of breath.  The patient has a history of atrial      fibrillation and whenever her heart races she experiences shortness      of breath.  This could have happened earlier when she was walking.      The D-dimer was within normal limits, very less likely that she has      a pulmonary embolus.  She is saturating 95-100% on room air.  I      suspect the patient has panic attacks which caused her the      symptoms.  We will monitor her pulse oximetry continuously.  2. Chest pressure.  The patient had a cardiac catheterization done in      May 2009, and it revealed no significant coronary artery disease or      coronary calcification.  Very less likely that she has cardiac      ischemia.  Again, I suspect panic attacks to be cause of the      symptoms.  3. Generalized anxiety disorder.  We will recommend scheduled or      p.r.n. benzodiazepine for her symptoms.  4. Atrial fibrillation.  The patient reportedly was on digoxin      previously and it was  stopped.  Now her heart rate is well      controlled  in the mid to high 50s.  The rhythm is sinus currently.      We will monitor her on telemetry.  5. Gastroesophageal reflux disease.  We will continue the proton pump      inhibitor.  6. Major depressive disorder.  We will continue Lexapro.  7. Constipation.  We will continue MiraLax.  8. Deep venous thrombosis prophylaxis with unfractionated heparin      subcutaneously.  9. Fluid/Electrolyte/Nutrition:  We will run normal saline at a rate      of 75 mL an hour.  We will replace electrolytes as needed.  She      will be started on an AHA diet.  10.The patient will be admitted to telemetry floor for monitoring      closely.      Lucile Crater, MD  Electronically Signed     TA/MEDQ  D:  11/09/2008  T:  11/09/2008  Job:  [id number]   cc:   [facility]

## 2010-09-03 NOTE — Discharge Summary (Signed)
NAMELENOIR, FACCHINI           ACCOUNT NO.:  [id number]   MEDICAL RECORD NO.:  [id number]          PATIENT TYPE:  INP   LOCATION:  5709                         FACILITY:  [facility]   PHYSICIAN:  Janee Morn, M.D.         DATE OF BIRTH:  March 10, 1928   DATE OF ADMISSION:  02/25/2007  DATE OF DISCHARGE:  03/10/2007                               DISCHARGE SUMMARY   DISCHARGING PHYSICIAN:  Dr. Janee Morn.   OPERATING SURGEON:  Dr. Lindie Spruce.   CONSULTATIONS:  Dr. Jacinto Halim with [facility].   REASON FOR ADMISSION:  Ms. Burditt is a 77 year old female who  presented to the ER with a 4-5 day history of abdominal pain and now  currently with peritonitis.  Dr. Tamsen Roers contacted Dr. Lindie Spruce because of  the patient's abdominal pain.  She was noted to have what they thought  was a small bowel obstruction on CT scan.  It was done in an outside  institution.  On presentation to the ER the patient told Dr. Lindie Spruce that  she had had a 4-5 day history of abdominal pain and was actually seen at  [facility] emergency room 3-4 days ago with abdominal pain, also  diagnosed with a UTI.  At that point she was started on ciprofloxacin  and also Phenergan for nausea and vomiting which she was later unable to  take due to continued vomiting.  At this point that is when she  presented to the [facility] ER with continued abdominal pain, and had a  CT scan done that demonstrated a bowel obstruction.  It was also noted  on CT that the patient had large amounts of what they called ascites.  On examination she had some peritonitis that was combined with  questionable ascites and was felt to have some significant intra-  abdominal pathology and was then taken to the operating room.  On exam  in the emergency room she was found to be afebrile with stable vital  signs.  Her lungs were clear.  Her cardiac exam revealed regular rate  and rhythm, but she did have a history of atrial fibrillation.  Her  abdomen at this  time was distended and quiet, and she had diffuse  tenderness with tap and cough tenderness but no heel tapping tenderness.  She does note that she is distended above her usual size.  Lab studies  at this time show normal hemoglobin and white count 12.3.  UA at this  time still does appear to be abnormal.  Chest x-ray was unremarkable,  and EKG showed few PAC's.  Based on all of this Dr. Lindie Spruce then chose to  take her to the operating room.  In the operating room she was found to  have peritonitis with a small bowel obstruction, and a small bowel  resection was performed at this time.   ADMITTING DIAGNOSIS:  Small bowel obstruction with associated  peritonitis and ascites.   HOSPITAL COURSE:  After the patient was taken to the OR for an  exploratory laparotomy which revealed an incarcerated small bowel  obstruction where then a small bowel resection was performed,  the  patient was taken to the floor in stable condition.  After the operation  the patient had an NG tube, and this was continued for several days with  an NPO status.  The patient then developed a postoperative ileus, and  due to this prolonged ileus the patient had a PIC line placed and was  started on TNA.  On postoperative day #7 the patient was advanced to a  full liquid diet because the patient began to have some flatus.  However, her belly was still quite distended.  The patient tolerated  full liquids well, and several days later on postop day #10 the patient  was started on a regular diet.  Once the patient tolerated this diet,  her TNA was discontinued.   On postop day #1 the patient began to get confused.  Dr. Derrell Lolling at this  time ordered Benadryl.  The next day she seemed less confused.  However,  on postop day #3 the patient began to appear slightly more paranoid, and  so at this time her narcotics at well as any benzodiazepines were  discontinued.  Because her narcotics were discontinued, she was then  placed on  Toradol for pain.  Over the next several days the patient's  confusion and paranoia began to improve.  However, the patient began to  go through rapid cycling mood swings of being in a cheerful state and  then going into a depressed state.  This cycling began to improve as  well over her course of stay.  However, she does still show some signs  of rapid mood swings depending on her present medical condition.   On postoperative day #2 the patient was started on DVT prophylaxis and  discontinued throughout her entire stay.  It was discontinued on her  last day before she was discharged to her skilled nursing facility.   Several days after surgery the patient's white blood cell count began to  increase.  At this time the patient was admitting to having multiple  bouts of diarrhea as well as coughing up green mucus type material.  A  Clostridium difficile was checked, and all three results came back  negative.  This diarrhea continued to improve over her stay here.  On  day #12 she did complain about her diarrhea again, and at this time she  was offered Imodium which did seem to help the patient have more formed  stools.  She was also encouraged to continue eating solid foods, and was  assured that this diarrhea was a normal occurrence after this type of  surgical procedure.  Chest x-ray was also obtained which showed  bilateral pleural effusions as well as a possible mild pneumonia.  We  felt because her Clostridium difficiles were negative that this was  probably the reason her white blood cell count was continuing to trend  upward.  At this time we started her on Avelox.  Her white blood cell  count then increased again up to 24,000, and so she was switched to  Tygacil.  After seven days of being on Tygacil and her white blood cell  count normalizing, the Tygacil was discontinued at this time.  Repeated  chest x-rays do show a stable bilateral pleural effusions.  However,  there is some  improvement in her pneumonia.   On postoperative day #5 the patient began to exhibit some signs of  hypotension as well as decreased urine output.  She also was showing  signs of bilateral lower  extremity 2+ pitting edema.  Because of her  hypotension her Toradol was discontinued at this time.  We felt this  edema was due to some third spacing of her fluid.  She was also showing  signs of being volume depleted.  At this time her hemoglobin had dropped  to 8, and therefore we increased her IV fluids at this time.  The  patient later informed us of a prior cardiac workup with an  echocardiogram that was done six weeks prior to this patient's  admission.  We received records from [facility] to follow  up on any cardiac reasons for her edema.  At this time cardiology was  consulted to help [place] determine if the patient was exhibiting signs of  possible congestive heart failure symptoms.  After examination  cardiology felt that the patient showed mild symptoms of diastolic heart  failure and that her edema would continue to improve on its own.  At  this time the patient was transfused with 1 unit of blood, and her blood  pressure began to increase and normalize.  She began over the next  several days to diurese on her own with an increase in her urine output.  At time of discharge the patient's edema is still present with 2+  pitting edema.  However, it is continuing to get better progressively  each day due to her self diuresing.   Physical therapy was also consulted to evaluate the patient's  deconditioning.  It was felt by them that she would need assistance with  walking.  At this time a skilled nursing facility placement was ordered.  Throughout the patient's stay physical therapy feels that the patient  has begun to get somewhat stronger; however, her conditioning is still  not up to par and feeling that the patient would be safe to go home on  her own.  At this time the  patient is walking with a walker, and is  still somewhat unsteady.  Because of this physical therapy thinks that  the patient would still need a skilled nursing facility placement before  the patient can go home by herself.   On postoperative day #13 the patient was found to be hypokalemic with a  potassium 3.  The patient was then given 40 mEq potassium x1 dose now  while she is in the hospital before she goes to [facility]  facility.  It is recommended that if the patient does not get her second  dose of potassium while she is still here in the hospital that she  receive it tomorrow morning at the skilled nursing facility.   DISCHARGE DIAGNOSES:  1. Status post small bowel resection for incarcerated small bowel      obstruction.  2. Mild pneumonia/respiratory failure/bilateral pleural effusions      which are stable at this time.  3. Diarrhea which is improving.  4. Hypokalemia.  5. Depression with rapidly changing moods depending on patient's      medical condition.  6. Altered mental status which seems to have improved.  7. Hypotension which is normalized at this time.  8. Leukocytosis which is normalized.   DISCHARGE MEDICATIONS:  1. Lipitor 10 mg daily.  2. Metamucil 1 tsp daily.  3. Women's Multivitamin daily.  4. She was also on other various p.r.n. medications here that may be      discontinued once at skilled nursing facility if she no longer      needs these or per skilled  nursing facility's p.r.n. medication      protocol.   DISCHARGE INSTRUCTIONS:  For wound care the patient is told that she may  shower, and at this time she is to pat her Steri-Strips dry.  She is to  not rub them.  She is also told to let her Steri-Strips fall off on  their own.  At this time she may increase her activity slowly, walk up  steps, and is to walk with assistance until it is felt by skilled  nursing facility that she is strong enough to walk on her own.  She may  shower at  this time.  However, she is not to bathe for two more weeks.  She is not to lift anything greater than 15 pounds for the next two  weeks, and she is to not drive for the next two weeks.  She is to return  to see Dr. Lindie Spruce at [facility] when she is discharged from  [facility].  She is to call for this appointment.  It is also  recommended at this time if the patient receives her second dose of  potassium at the skilled nursing facility to follow up with a BMP to  assess her potassium levels after the prior two potassium doses.      Letha Cape, PA    ______________________________  Janee Morn, M.D.    KEO/MEDQ  D:  03/10/2007  T:  03/10/2007  Job:  [id number]   cc:   Lindie Spruce, M.D.  Cristy Hilts. Jacinto Halim, MD  Beckey Rutter, MD

## 2010-09-03 NOTE — Consult Note (Signed)
[name]           ACCOUNT NO.:  [id number]   MEDICAL RECORD NO.:  [id number]          PATIENT TYPE:  INP   LOCATION:  3707                         FACILITY:  [facility]   PHYSICIAN:  Antonietta Breach, M.D.  DATE OF BIRTH:  [birth year]   DATE OF CONSULTATION:  11/10/2008  DATE OF DISCHARGE:  11/10/2008                                 CONSULTATION   REQUESTING PHYSICIAN:  Tej Atluri.   REASON FOR CONSULTATION:  Anxiety.   HISTORY OF PRESENT ILLNESS:  [name] is a 77 year old female  admitted to the [facility] on November 08, 2008, due to chest pain,  generalized weakness, and shortness of breath.   Prior to admission, she had been on her normal morning walk and  developed shortness of breath.   She has been experiencing excessive worry and feeling on edge.  She does  realize that she has a pattern of talking excessively about details and  that at times it can become exhausting when she focuses too much upon  items within her day that do not require as much attention as she gives  them.  At times she would lose sleep over worry, however, now she has  been able to let things rest at night and get sleep.   Six weeks ago, she presented to her primary care physician's office and  had such a long list of general medical problems, symptoms, and  complaints that it was clear to her physician that she had excessive  worry.  He prescribed Lexapro which has been at 10 mg per day.  The  patient states that she does not want to take medication and wants some  form of psychotherapy alternative.   She has constructive future goals and interests.  She does not have any  thoughts of harming herself or others.  She has no hallucinations or  delusions.  Her orientation and memory function are intact.   PAST PSYCHIATRIC HISTORY:  In 1993, she was dealing with excessive worry  and did undergo some psychotherapy which helped.   Her husband passed away 3 years ago which did  involve grief.   FAMILY PSYCHIATRIC HISTORY:  None known.   SOCIAL HISTORY:  Widowed.  She has 2 children and 1 child has severe  scoliosis.  [name] does not use any alcohol or illegal drugs.  The patient lives alone.   PAST MEDICAL HISTORY:  1. Small bowel incarceration, status post resection.  2. Paroxysmal atrial fibrillation.  3. Arthritis.  4. Hyperlipidemia.  5. Chronic fatigue syndrome.  6. Remote history of malignant melanoma.  7. Gastroesophageal reflux disease.  8. Varicose veins.  9. Chronic obstructive pulmonary disease.  10.Constipation.  11.Hemorrhoids.   MEDICATIONS:  The MAR is reviewed.  She is on Lexapro 10 mg daily which  was just restarted 2 days ago.   ALLERGIES:  CIPROFLOXACIN.   TSH is within normal limits.   Sodium 142, BUN 11, creatinine 0.99, glucose 66.   WBC 6.2, hemoglobin 12.5, platelet count 185.   REVIEW OF SYSTEMS:  CONSTITUTIONAL, HEAD, EYES, EARS, NOSE, THROAT,  MOUTH, NEUROLOGIC, PSYCHIATRIC, CARDIOVASCULAR, RESPIRATORY,  GASTROINTESTINAL, GENITOURINARY, SKIN, MUSCULOSKELETAL, HEMATOLOGIC,  LYMPHATIC, ENDOCRINE, METABOLIC:  All unremarkable.   EXAMINATION:  VITAL SIGNS:  Temperature 97.7.  Pulse 53.  Respiratory  rate 16.  Blood pressure 106/67.  O2 saturation on room air 97%.  GENERAL APPEARANCE:  [name] is an elderly female sitting up in  her hospital bed with no abnormal involuntary movements.   MENTAL STATUS EXAM:  [name] is alert.  Her eye contact is good.  Her affect is anxious.  Mood is anxious.  She is oriented to all  spheres.  Her memory is intact to immediate, recent, and remote.  Her  fund of knowledge and intelligence are within normal limits.  Her speech  involves normal rate and prosody.  Thought process is logical, coherent,  goal directed.  No looseness of associations.  Thought content, no  thoughts of harming herself or others.  No delusions or hallucinations.  Her insight is intact.  Her  judgment is intact.   ASSESSMENT:  AXIS I:  293.84 anxiety disorder, not otherwise specified,  rule out generalized anxiety disorder.  AXIS II:  None.  AXIS III:  See past medical history.  AXIS IV:  Primary support group, general medical.  AXIS V:  55.   [name] is not at risk to harm herself or others.  She agrees to  call emergency services as needed.   The undersigned provided ego-supportive psychotherapy.  Education on  various forms of treatment was given.  Cognitive behavioral therapy was  discussed with deep breathing and progressive muscle relaxation.  The  patient declines pharmacotherapy and wants to pursue cognitive  behavioral therapy.   Therefore, would write an order for the social worker to set up  cognitive behavioral therapy for the patient.  This can also involve  deep breathing and progressive muscle relaxation.      Antonietta Breach, M.D.  Electronically Signed     JW/MEDQ  D:  11/12/2008  T:  11/12/2008  Job:  [id number]

## 2010-09-03 NOTE — H&P (Signed)
[name], [name]           ACCOUNT NO.:  [id number]   MEDICAL RECORD NO.:  [id number]          PATIENT TYPE:  EMS   LOCATION:  MAJO                         FACILITY:  [facility]   PHYSICIAN:  Mobolaji B. Bakare, M.D.DATE OF BIRTH:  1928/01/18   DATE OF ADMISSION:  08/26/2007  DATE OF DISCHARGE:                              HISTORY & PHYSICAL   PRIMARY CARE PHYSICIAN:  Medical laboratory scientific officer.   CARDIOLOGIST:  Cristy Hilts. Jacinto Halim, MD.   CHIEF COMPLAINT:  Palpitations and shortness of breath.   HISTORY OF PRESENTING COMPLAINT:  [name] is a pleasant 77 year old  Caucasian female with past medical history as detailed below.  She has  been in relatively good health.  She has hypercholesterolemia and uses  Lipitor and on baby aspirin.  She was in her usual state of health until  about 6:45 a.m. today when she developed palpitations which was  associated with shortness of breath and dizziness.  She felt like she  was going to pass out.  She had to sit and put her head down.  This was  not going away, hence she called EMS and she was brought to the  emergency room.  While waiting for EMS, the patient was still able feel  her heart without any difficulties.  She denies any chest pain,  diaphoresis, numbness.  She, however, had nausea but no vomiting.  She  had associated lower jaw pain without any radiation.  Upon arrival by  EMS, the patient was noted to have atrial fibrillation on the monitor  and heart rate was 110, respiratory rate 22, blood pressure 124/74 and  she was saturating well at 100% on room air, blood glucose was 88.  She  was mentally alert.  There was no focal weakness.  The patient was  brought to the emergency room.  Initial vitals in the emergency room  revealed a blood pressure of 96/67, this subsequently dropped to 84/60.  The patient has received IV fluid and blood pressure is currently  104/78.   She has history of paroxysmal atrial fibrillation, initially  diagnosed  about four years ago.  She was placed on Digitek by Dr. Murray Hodgkins and  patient was subsequently taking off after one year when she remained  asymptomatic.   REVIEW OF SYSTEMS:  She denies fever, chills and chest pain.  There is  no abdominal pain, vomiting or diarrhea.  She had some dry cough and  throat discomfort yesterday.  She denies dysuria, urgency or increased  frequency of micturition.   PAST MEDICAL HISTORY:  1. Paroxysmal atrial fibrillation diagnosed about four years ago.  She      was initially placed on Digitek but now taken off.  2. Hypercholesterolemia.  3. Gastroesophageal reflux disease.  She had an endoscopy three weeks      ago and has been placed on Prilosec.  4. Arthritis.  5. Tubulovillous adenomatous colon polyps being followed by Dr.      __________.  6. Chronic constipation.  7. Hemorrhoids.  8. History of varicose veins.  She is status post right lower      extremity  venous stripping.  9. COPD/ex-smoker.  10.Depression.  11.Anxiety.  12.History of melanoma involving the vulva.   PAST SURGICAL HISTORY:  1. Status post terminal ileal resection in November 2008 for small-      bowel obstruction and incarcerated small bowel obstruction.  2. Right lower extremity venous stripping.  3. Appendectomy.   CURRENT MEDICATIONS:  1. Omeprazole one daily one hour before meals.  2. Lipitor 20 mg daily.   ALLERGIES:  AMOXICILLIN causes a rash and hives.  CIPROFLOXACIN causes  rash and hives.   FAMILY HISTORY:  Mother passed away from old age at the age of 56.  Father had throat cancer.  He was a smoker.  The patient has two  daughters who live out of state.   SOCIAL HISTORY:  She is a retired Advice worker.  She is very active  at home.  She is a widow.  She has smoked cigarettes for about 30 years,  quit about 30 years ago.  She is currently independent of activities of  daily living.   INITIAL VITALS:  As mentioned in the HPI.    PHYSICAL EXAMINATION:  GENERAL APPEARANCE:  The patient is awake, alert,  oriented to time, place and person.  HEENT:  Normocephalic and atraumatic head.  Pupils equal, round and  reactive to light.  Extraocular muscle movement intact.  NECK:  No elevated JVD.  No carotid bruit.  Mucous membranes moist.  No  oral thrush.  No exudates or hyperemia in the throat.  LUNGS:  Clear clinically to auscultation.  No wheeze or rhonchi.  No  crackles.  CARDIOVASCULAR:  S1 and S2 irregular.  No gallop or rub.  ABDOMEN:  Nondistended, soft, nontender.  Bowel sounds present.  No  palpable organomegaly.  EXTREMITIES:  No pedal edema or calf tenderness.  She has left lower  extremity varicose veins, quite distended and dorsalis pedis pulses  palpable bilaterally at 2+, although it is noted that she has cold lower  extremities.  No cyanosis.  CNS:  Muscle power 5/5 in all limbs.  No  focal weakness.  Cranial nerves are intact.  SKIN:  No rash or petechiae.   INITIAL LABORATORY DATA:  BNP 195.  Cardiac markers at the point of care  first set was normal.  Sodium 138, potassium 4.4, chloride 102, glucose  96, BUN 14, creatinine 1, ionized calcium 1.15.  White cell 8.4,  hemoglobin 13.2, hematocrit 39.8, platelets 227; normal differential.   Chest x-ray shows COPD.  No acute cardiopulmonary abnormality.   ASSESSMENT AND PLAN:  [name] is a 77 year old Caucasian female  with history of paroxysmal atrial fibrillation which apparently has been  quiescent for some time.  She is presenting to the emergency room today  with atrial fibrillation with rapid ventricular response of 110.  She  was symptomatic with shortness of breath, palpitation and lower jaw  pain.  She has borderline hypotension which has improved with IV fluids.  The patient was given 15 mg of Cardizem intravenous push and she is  currently on 5 mg per hour.  Heart rate is controlled in the 80s.   ADMISSION DIAGNOSES:  1. Atrial  fibrillation.  The patient will be admitted to telemetry      floor.  Will cycle cardiac enzymes q.8h. x3.  Check TSH, 2-D      echocardiogram.  Will continue with Cardizem infusion for today and      transition to p.o. Cardizem in a.m.  The patient will continue  with      aspirin 325 mg daily.  CHADS score is 1.  Anticoagulation is not      currently indicated.  Will obtain a 2-D echocardiogram to assess      ejection fraction, nevertheless, and will ask Dr. Jacinto Halim to see.      Will check fasting lipid profile and hemoglobin A1c.  2. Hypotension, likely secondary to atrial fibrillation with rapid      ventricular response.  Will continue with IV fluid normal saline at      75 mL per hour and monitor blood pressure closely.      Mobolaji B. Corky Downs, M.D.  Electronically Signed     MBB/MEDQ  D:  08/26/2007  T:  08/26/2007  Job:  [id number]   cc:   [facility] Dr. Kirstie Mirza R. Jacinto Halim, MD

## 2010-09-03 NOTE — H&P (Signed)
[name], [name]           ACCOUNT NO.:  [id number]   MEDICAL RECORD NO.:  [id number]          PATIENT TYPE:  INP   LOCATION:  5151                         FACILITY:  [facility]   PHYSICIAN:  Isidor Holts, M.D.  DATE OF BIRTH:  04/12/1928   DATE OF ADMISSION:  04/09/2007  DATE OF DISCHARGE:                              HISTORY & PHYSICAL   PRIMARY CARE PHYSICIAN:  Erskine Speed, M.D.   CHIEF COMPLAINT:  Weakness, increased thirst, fever, headache, abdominal  pain for 1 day.   HISTORY OF PRESENT ILLNESS:  This is a 77 year old female. For Past  Medical History, see below.  According to the patient, she was admitted  to [facility] from February 25, 2007, to March 10, 2007, for  incarcerated small-bowel obstruction and is now status post small-bowel  resection by Dr. Jimmye Norman.  Following discharge from hospital, she  went to [facility] facility for a 3-week rehabilitation  period and returned to her own home on April 06, 2007.  Had felt  quite well until morning of April 09, 2007, when she felt very  thirsty and dry, felt dehydrated and appeared to have noisy  stomach sounds.  She also felt left lower abdominal crampy pain but no  vomiting or diarrhea.  Her last bowel movement was in the morning of  April 02, 2007, and this was normal stool.  She has also had a  headache and a temperature of over 101 degrees Fahrenheit when this was  checked by her home health aide at approximately 12:00 p.m. on April 09, 2007.  She also felt she may have had chills.  Urine output appears  decreased; however, she denies dysuria.  She subsequently went to the  [facility] where she was evaluated. Urinalysis  showed pyuria.  She was also found to have an anemia.  She was referred  to the emergency department.   PAST MEDICAL HISTORY:  1. Status post admission to [facility] February 25, 2007, to      March 10, 2007, for  incarcerated small bowel and bowel      obstruction, status post small-bowel resection by Dr. Jimmye Norman.  2. Anxiety.  3. History of atrial fibrillation.  4. Chronic fatigue syndrome.  5. History of malignant melanoma  6. Osteoarthritis and sciatica  7. Dyslipidemia.   MEDICATION HISTORY.:  1. Aspirin.  2. Lipitor.   The doses were unobtainable at the time of this evaluation.   ALLERGIES:  1. AMOXICILLIN.  2. OXYCONTIN.  3. CIPROFLOXACIN.   REVIEW OF SYSTEMS:  As per HPI and Chief Complaint, otherwise negative.   SOCIAL HISTORY:  The patient returned to her own home from [facility]  nursing facility on April 06, 2007.  She has a home health aide.  She  is an ex-smoker, quit approximately 27 years ago. Nondrinker. Has a  history of drug abuse.  She has two daughters and was widowed  approximately one and one-half years ago.   FAMILY HISTORY:  The patient's father died of throat cancer.  He was  otherwise healthy.  The patient's mother died at age 34 years from old  age.  Family history is noncontributory.   PHYSICAL EXAMINATION:  VITAL SIGNS:  Temperature max 101.5, pulse 84 per  minute and regular, respiratory rate 18, blood pressure 111/60 mmHg,  pulse oximeter 98% on room air.  GENERAL:  The patient does not appear to be in obvious acute distress at  time of this evaluation.  Alert, communicative, not short of breath at  rest.  HEENT:  Moderate clinical pallor.  No jaundice.  No conjunctival  injection.  Throat is clear.  Visible mucous membranes were appear dry.  NECK:  Supple.  JVP not seen.  No palpable lymphadenopathy.  No palpable  goiter.  CHEST:  Clinically clear to auscultation.  No wheezes or crackles.  CARDIAC:  Heart sounds 1 and 2 heard, normal, regular, no murmurs.  Ab d  Full, soft and nontender.  No palpable organomegaly or palpable  masses.  Normal bowel sounds.  Healed midline laparotomy scar is noted.  EXTREMITIES:  Lower extremity  examination:  The patient has moderate  pitting edema bilaterally.  MUSCULOSKELETAL:  System remarkable only for arthritic changes.  CENTRAL NERVOUS SYSTEM:  No focal neurologic deficit on gross  examination.   INVESTIGATIONS:  Electrolytes:  Sodium 141, potassium 3.0, chloride 107,  CO2 26, BUN 7, creatinine 0.68, glucose 82.  Other laboratory findings  [facility] include CBC with WBC 8.8, hemoglobin  8.6, hematocrit 26.9, MCV 86.2, platelets 348.  Urinalysis shows wbc's  15-20, rbc's 1-3, bacteria trace.   A 12-lead EKG dated April 09, 2007, shows sinus rhythm, regular, 88  per minute, normal axis, no acute ischemic changes.   ASSESSMENT/PLAN:  1. Acute pyelonephritis.  The patient presents with positive urinary      sediment/pyuria and also systemic inflammatory reaction.  We shall,      therefore, commence on broad-spectrum antibiotic coverage.      Unfortunately, she is allergic to PENICILLIN and to the QUINOLONES.      We shall, therefore, place her on Primaxin.  Send off urine for      culture and sensitivity. For completeness, do blood cultures.  We      will also commence an intravenous fluids.   2  History of dyslipidemia.  We shall check lipid profile and TSH.   3  Normocytic anemia.  We shall do iron studies, B12 and folate.  We  anticipate that her hemoglobin will drop further with intravenous fluid  hydration, and will address as indicated.   1. Bilateral lower extremity edema.  This may be secondary to anemia      plus or minus hypoalbuminemia or venous insufficiency.  We shall      utilize TED stockings for now.  Check serum albumin levels.   Further management will depend on clinical course.      Isidor Holts, M.D.  Electronically Signed     CO/MEDQ  D:  04/09/2007  T:  04/11/2007  Job:  [id number]   cc:   Erskine Speed, M.D.

## 2010-09-03 NOTE — Discharge Summary (Signed)
[name], [name]           ACCOUNT NO.:  [id number]   MEDICAL RECORD NO.:  [id number]          PATIENT TYPE:  INP   LOCATION:  3707                         FACILITY:  [facility]   PHYSICIAN:  Charlestine Massed, MDDATE OF BIRTH:  [birth year]   DATE OF ADMISSION:  11/08/2008  DATE OF DISCHARGE:  11/10/2008                               DISCHARGE SUMMARY   PRIMARY CARE PHYSICIAN:  [employer].   CARDIOLOGY:  Cristy Hilts. Jacinto Halim, MD, of [facility].   REASON FOR ADMISSION:  Chest discomfort, shortness of breath, and  generalized weakness.   DISCHARGE DIAGNOSES:  1. Chest pain - acute myocardial infarction ruled out - normal      coronary angiography 1 year back.  2. Outpatient stress test, rule out ischemic heart disease.  3. Previously labeled as paroxysmal atrial fibrillation.  After      discussion with the cardiologist, it has been deemed that she does      not have any further atrial fibrillation recorded and she is not on      any medications for that.  4. Anxiety disorder, not otherwise specified.   DISCHARGE MEDICATIONS:  1. Aspirin 325 mg p.o. daily.  2. MiraLax 17 g in 8 ounces of water as needed daily.  3. Preparation H as needed.  4. Fish oil 1000 mg 1 capsule 1 daily.  5. Metamucil 2 teaspoons daily.  6. Multivitamin 1 tablet daily.   MEDICATION THAT WAS STOPPED FROM THE LIST:  Lexapro.   ALLERGIES:  She is labeled as allergic to CIPROFLOXACIN and AMOXICILLIN.   HOSPITAL COURSE:  1. Chest pain.  Ms. Sheera Illingworth is a 77 year old female, who      lives by herself and she hikes, and she is extremely active in her      day-to-day life.  She was going for a morning walk and she usually      walks for a mile and she states that she has been taking more time      to complete a walk.  When she came, she was giving me the      impression that she drinks quite a lot of water during the walk.      She also has had the impression that she  needs to drink at least a      cup or two of water every hour or so and she has to make at least      200 mL of urine every time she goes.   The chest pain was more pressure-like symptom, and there was no  radiation of the pain, and she says sometimes it comes when she is  walking and sometimes it comes with rest also.  She did not have any  wheezing.  She gets slightly short of breath when she is walking.   The patient has been diagnosed with atrial fibrillation, but so far we  have not had any further documentation of atrial fibrillation anywhere  on telemetry or on EKG.  I spoke with her cardiologist, Dr. Jacinto Halim, who  said that there was one documentation from [facility] in  2006,  which showed atrial fibrillation and after that there is no further  documentation and so she has been taken off and there is no further  report of atrial fibrillation, so her medications have been stopped  including digoxin.  She is currently only on aspirin 325 mg p.o. daily.   I spoke with the [facility], and in view of the  fact that her troponins are normal during this study and there is no  rise in CK-MB at any time to other than marginal troponin I of 0.07,  which could fit into her laboratory part or indeed into the standard  deviation error, and no EKG changes at any time, I would prefer the  patient getting an outpatient stress test.  This was discussed with the  Cardiology, and the patient will be set up for outpatient stress test,  and the Cardiology office will call her for stress test set up with her  regular cardiologist, Dr. Jacinto Halim.   She had other labs done during this study, which included lipid profile  and a TSH study, which are all within normal limits.   1. Anxiety disorder - the patient was seen by Dr. Jeanie Sewer during her      hospital stay as she was insistent on drinking lot of water.  Dr.      Jeanie Sewer has diagnosed her with some mild anxiety  disorder and she      has agreed for cognitive behavioral therapy.  Social worker to set      up CBT and then discharge the patient.  She has refused to take      Lexapro, and so she will not be on any medications.  She will be      discharged with cognitive behavioral therapy set up.  2. Episodes of constipation.  Continue MiraLax as p.r.n.   DISPOSITION:  Discharged back to home.   FOLLOWUP:  Follow up with [facility] for outpatient  stress test as prescribed by their cardiologist, and the Cardiology  office will call them for appointment.   DISCHARGE INSTRUCTIONS:  Adhere to only the medication that have been  given.  Appropriate exercise and hydration as tolerated, and use  Gatorade instead of plain water most of the time for hydration.   Total of 40 minutes was spent on this discharge.      Charlestine Massed, MD  Electronically Signed     UT/MEDQ  D:  11/10/2008  T:  11/11/2008  Job:  [id number]   cc:   Cristy Hilts. Jacinto Halim, MD

## 2010-09-03 NOTE — Op Note (Signed)
NAMECASSANDRA, [name]           ACCOUNT NO.:  [id number]   MEDICAL RECORD NO.:  [id number]          PATIENT TYPE:  INP   LOCATION:  4742                         FACILITY:  [facility]   PHYSICIAN:  Cherylynn Ridges, M.D.    DATE OF BIRTH:  [birth year]   DATE OF PROCEDURE:  02/25/2007  DATE OF DISCHARGE:                               OPERATIVE REPORT   ADDENDUM:  The patient had an incorrect needle count.  An x-ray was done  postoperatively, which failed to demonstrate the presence of an intra-  abdominal needle.  We were able to see from the pelvis up to above the  costal margins and no SH needle, which was the one that was noted to be  missing, was noted.  The patient was sent to recovery room in stable  condition.      Cherylynn Ridges, M.D.  Electronically Signed     JOW/MEDQ  D:  02/25/2007  T:  02/26/2007  Job:  [id number]

## 2010-09-03 NOTE — Discharge Summary (Signed)
[name], [name]           ACCOUNT NO.:  [id number]   MEDICAL RECORD NO.:  [id number]          PATIENT TYPE:  INP   LOCATION:  5151                         FACILITY:  [facility]   PHYSICIAN:  [name], D.O.    DATE OF BIRTH:  1927-10-20   DATE OF ADMISSION:  04/09/2007  DATE OF DISCHARGE:  04/14/2007                               DISCHARGE SUMMARY   FINAL DIAGNOSIS:  Fever, resolved, suspect related to urinary tract  infection.  She had undergone urine culture that reveals no growth today  as well as blood cultures that are negative to date.  She continues with  loose stools postoperatively.  Small bowel resection by Dr. Lindie [name] in  November 2008.   SECONDARY DIAGNOSES:  1. Status post bowel resection for incarcerated small bowel      obstruction as noted above.  2. Iron-deficiency anemia with iron saturation of 7%, etiology not      absolutely clear.  Her stool Hemoccults were negative.  I have      recommended that she undergo follow-up with Dr. Claudette [name],      gastroenterology, who had seen her approximately three years ago.      I have asked that either she obtain referral from her primary care      physician, Dr. Murray [name], or call herself to schedule an      appointment over the next few weeks as soon as possible.  She had a      CT scan that did indicate some possible esophagitis that did      recommend follow-up.  3. Bilateral effusion pleural effusions associated with atelectasis.      Again, I have additionally requested that Dr. Murray [name] follow-      up on these with imaging in the outpatient setting to further      assess resolution.  She has been asymptomatic during the inpatient      setting  4. Anxiety.  5. Chronic fatigue syndrome.  6. History of malignant melanoma  7. Dyslipidemia.   MEDICATIONS ON DISCHARGE:  She should take:  1. Iron sulfate 325 mg p.o. b.i.d.  2. Doxycycline 100 mg twice daily for 10 days.  3. Protonix 40 mg daily.  4.  She can resume her Lipitor 10 mg daily.  5. Multivitamin daily.  6. Aspirin 81 mg daily as before.   DISPOSITION:  She is felt to be medically stable for discharge home and  [facility] to provide Home Care.  Additionally, she should  follow up with Dr. Murray [name].  I have asked that she call today to  schedule an appointment within the next week or so.  She has an  appointment with Dr. Jimmye [name] on April 27, 2007 at 9:00 a.m., and  she is instructed that she does have recurrent intractable nausea,  vomiting or intractable fever she should return to the hospital.   HISTORY OF PRESENT ILLNESS:  For full details, please refer to the H&P  as dictated by [name].  However, briefly [name] is a  77 year old female who underwent exploratory laparotomy  as well as  enterolysis and small-bowel resection and exploration of retroperitoneal  mass on February 25, 2007 and subsequently went to a skilled nursing  facility for rehabilitation.  In any event, she develop fevers, headache  and abdominal pain and presented to [facility].  Was discovered  to have 15-20 WBCs in her urine and was transferred to [facility].  She had multiple drug allergies.   HOSPITAL COURSE:  She was admitted, placed on Primaxin due to her  allergies.  She concluded 3 days of Primaxin.  Subsequently, her  symptoms did appear resolved.  She had some loose stools that she was  concerned about.  Also, her blood count was low requiring 2 units of  PRBC transfusion.  This is felt to be postoperative, and she does have  iron-deficiency anemia that does warrant further investigations and  evaluation in the outpatient setting including a gastroenterology  consultation that I have recommended to both her and her daughter as  well as her primary care physician in the outpatient setting.   Her laboratory data at this time include a sodium 137, potassium 3.6,  BUN 7, creatinine 0.81,  glucose of 90.  WBC of 7.2, hemoglobin 11,  platelet count 279.  CT scan as described above.  For full details,  please review the full dictated radiology report.  This did have some  concerns for esophagitis in addition to improved appearance of the  slightly prominent proximal small bowel.  The cecum was mobile.  There  was interval improvement, although incomplete clearance of free fluid in  her pelvis.  There was no defined drainable abscess within the pelvis.  There was some thickening of the distal esophagus concerning for  esophagitis with recommendations for follow-up imaging.      [name], D.O.  Electronically Signed     ESS/MEDQ  D:  04/14/2007  T:  04/14/2007  Job:  [id number]   cc:   Lenon Curt Chilton Si, M.D.  Venita Lick. Russella Dar, MD, Clementeen Graham  Cherylynn Ridges, M.D.

## 2010-09-03 NOTE — Discharge Summary (Signed)
NAMEJIANNA, [name]           ACCOUNT NO.:  [id number]   MEDICAL RECORD NO.:  [id number]          PATIENT TYPE:  INP   LOCATION:  4735                         FACILITY:  [facility]   PHYSICIAN:  Jennelle Human. Marisue Humble, MD DATE OF BIRTH:  10/10/1927   DATE OF ADMISSION:  08/26/2007  DATE OF DISCHARGE:  08/28/2007                               DISCHARGE SUMMARY   ADMISSION DIAGNOSES:  Atrial fibrillation with RVR and jaw pain.   DISCHARGE DIAGNOSES:  Atrial fibrillation with RVR and jaw pain.   PROCEDURES PERFORMED:  Cardiac catheterization.   HISTORY OF PRESENT ILLNESS:  This is a 77 year old Caucasian lady with  the past history of paroxysmal atrial fibrillation,  hypercholesterolemia, and COPD, who presented to the emergency  department with complaints of palpitation and shortness of breath.  She  was found to be in atrial fibrillation with a rate of 110.  Concurrently, she also complain of some left sided jaw pain that was  associated with atrial fibrillation.   HOSPITAL COURSE:  She was admitted and was placed on a Cardizem drip for  her atrial fibrillation with RVR.  However, she became hypotensive on  the Cardizem drip and it was further discontinued.  She subsequently  spontaneously converted to a normal sinus rhythm.  A cardiology consult  was placed secondary to the atrial fibrillation with RVR and the worry  that her left jaw pain was an anginal equivalent.  She was taken to the  Cardiac Catheterization Lab on 05/08 rule out obstructive coronary  artery disease and she had no significant coronary artery disease.  Her  RCA was cleaned.  She was right dominant.  Her circumflex had no disease  noted.  Her LAD was cleaned with exception of questionably a 20% lesion  after the first septal perforator in the mid section.  She had a  tortuous abdominal aorta, but clear of disease.   DAY OF DISCHARGE:  The patient was doing well, groin site showed no  complications and patient was  ready to be discharged.  Vital signs were  stable.  Physical exam showed her to be in a normal sinus rhythm.  Her  groin site looked normal, no hematoma, no bruit.  Pulses in both lower  extremities were normal.   DISCHARGE INSTRUCTIONS:  She is to followup with Dr. Chilton Si for a INR  check on Monday 11th.  She has been started on Coumadin 5 mg every  night.  She has to take this daily.  She was also started on Toprol 50  mg every day as well.  She is to follow up with cardiology as an  outpatient.   DISCHARGE MEDICATIONS:  1. Lipitor 20 mg daily.  2. Aspirin 81 mg daily.  3. Fish oil 1 gm daily.  4. Multivitamin 1 tablet daily.  5. Calcium 1 tablet daily.  6. Toprol XL 50 mg daily.  7. Coumadin 5 mg at night.   RESTRICTIONS:  She was asked not to take a bath at this point, but only  to shower for the next several days secondary to her groin puncture.  She was told not  to lift anything more than 10 pounds for approximately  5 days.  She can do light duty walking, light duty work.  However, she  was advised not to do any of her usual hikes this next upcoming week.  She understood these instructions, upon leaving had no further  questions.      Jennelle Human Marisue Humble, MD  Electronically Signed     GBS/MEDQ  D:  08/28/2007  T:  08/28/2007  Job:  [id number]

## 2010-09-03 NOTE — Op Note (Signed)
NAMEAMRITA, [name]           ACCOUNT NO.:  [id number]   MEDICAL RECORD NO.:  [id number]          PATIENT TYPE:  INP   LOCATION:  4742                         FACILITY:  [facility]   PHYSICIAN:  Cherylynn Ridges, M.D.    DATE OF BIRTH:  1927-08-08   DATE OF PROCEDURE:  02/25/2007  DATE OF DISCHARGE:                               OPERATIVE REPORT   PREOPERATIVE DIAGNOSIS:  Peritonitis with small bowel obstruction and  free fluid on CT.   POSTOPERATIVE DIAGNOSES:  1. Infarcted terminal ileum secondary to strangulating obstruction      with an omental adhesion.  2. Pelvic kidney.   PRINCIPAL PROCEDURES:  1. Exploratory laparotomy.  2. Enterolysis.  3. Small bowel resection.  4. Exploration of retroperitoneal mass.   SURGEON:  Marta Lamas. Lindie Spruce, M.D.   ASSISTANT:  Leonie Man, M.D.   ANESTHESIA:  General endotracheal.   ESTIMATED BLOOD LOSS:  75 mL.   COMPLICATIONS:  None.   CONDITION:  Stable.   FINDINGS:  The patient had 3 feet of dead distal small bowel to  approximately 2 inches from the ileocecal valve.  This was resected.  The patient had a mass in the left lower quadrant in the a pelvic area.  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   INDICATIONS FOR OPERATION:  The patient is a 77 year old female who was  admitted after 4 days of abdominal pain, treated with antibiotics for  possible UTI.  She has had profuse nausea and vomiting and today has  peritonitis with a CT scan demonstrating free fluid, and she was taken  to the operating room for an exploration.   OPERATION:  The patient was taken to the operating room and placed on  the table in supine position.  After an adequate endotracheal anesthetic  was administered, she was prepped and draped in the usual sterile manner  exposing the midline.   A midline incision was made from just above the umbilicus down to below  the umbilicus down to the pubic crest.  We used  electrocautery to help  [place] get into the peritoneal cavity and as soon as we opened into the  peritoneal cavity, a large amount of hemorrhagic ascites was obtained.  We aspirated probably about 300-400 mL of hemorrhagic ascites and upon  exploration of the pelvis noted about 3 feet of dead small bowel.   We found that there was an adhesive band from the omentum down to the  right lower quadrant, which was causing the obstruction and the ischemia  at the mesentery.  We released this band of omentum using electrocautery  and subsequently had to resect about 3 feet of small bowel.  We did this  using a GIA-75 stapler with 3.5-mm closure staples and taking it into  the proximal ileum and down to about 2 inches from the ileocecal valve.  The mesentery was taken with a LigaSure with 2-0 silk suture ligatures  for some continued bleeding.  Once this resection was done, we did a  side-to-side anastomosis at the terminal ileum using a GIA-75 stapler  and a resulting  TA-60 stapler with 3.5-mm closure staples to close off  the enterotomy.  The mesentery was closed using 2-0 silk sutures.  We  ran the small bowel and milked most of the obstructed contents back into  the stomach, which was aspirated out of the patient's NG tube for total  of about 1200-1300 mL.  An NG tube was left in place in the stomach.   After doing so, we irrigated with several liters of warm saline  solution, irrigating out the hemorrhagic ascites.  Once this was done,  we closed the abdomen using a running looped stitch of #1 PDS.  Once  this done the skin was closed using stainless steel staples.  We did  explore the retroperitoneal mass in the left lower quadrant which was  palpated, noting that there was ureter-like structures on the surface.  As we got into the peritoneum overlying it, we could see that this was  renal tissue.  We reapproximated and repaired the peritoneum using  running 3-0 Vicryl suture.  We closed as we  had mentioned previously and  then the skin with stainless steel staples.  The needle count was  incorrect as there was one SH needle which was from the 3-0 Vicryl which  was missing at the end of the case, and an x-ray is pending.  Lap counts  and instrument counts were correct.      Cherylynn Ridges, M.D.  Electronically Signed     JOW/MEDQ  D:  02/25/2007  T:  02/26/2007  Job:  [id number]

## 2010-09-03 NOTE — Cardiovascular Report (Signed)
[name]           ACCOUNT NO.:  [id number]   MEDICAL RECORD NO.:  [id number]          PATIENT TYPE:  INP   LOCATION:  4735                         FACILITY:  [facility]   PHYSICIAN:  Cristy Hilts. Jacinto Halim, MD       DATE OF BIRTH:  [birth year]   DATE OF PROCEDURE:  08/27/2007  DATE OF DISCHARGE:                            CARDIAC CATHETERIZATION   PROCEDURE PERFORMED:  1. Left ventriculography.  2. Selective right and left coronary arteriography.  3. Ascending aortogram.  4. Abdominal aortogram.  5. Right femoral arteriogram with closure of right femoral arterial      access with StarClose.   INDICATIONS:  [name] is a 77 year old fairly active  Caucasian female who had history of palpitations and atrial fibrillation  in the past, hyperlipidemia, GERD, who has been complaining of shortness  of breath and chest pain, was admitted to the hospital with atrial  fibrillation which converted back to sinus rhythm.  Given this, ischemic  etiology could not be excluded, hence she was now brought to the cardiac  catheterization lab to evaluate her coronary anatomy.  Ascending  aortogram was performed to evaluate for ascending aortic aneurysm, and  abdominal aortogram for abdominal atherosclerosis and abdominal aortic  aneurysm.   HEMODYNAMIC DATA:  The left ventricular pressure was 116/1 with an end  diastolic pressure of 7 mmHg.  Aortic pressure 111/57 with a mean of 82  mmHg.  No pressure gradient across the aortic valve.   ANGIOGRAPHIC DATA:  Left ventricle:  Left ventricular systolic function  was normal with an ejection fraction of 65% without any significant  mitral regurgitation.   Ascending aortogram:  Ascending aortogram revealed presence of 3 aortic  valve cusps.  There was no evidence of aortic regurgitation or evidence  of aortic dissection.  There is no evidence of aortic regurgitation or  no evidence of ascending aortic aneurysm.   Right coronary artery:   Right coronary artery is a large caliber and  super dominant vessel giving origin to a very large PLA, which supplies  large parts of the posterolateral branch of the left ventricle.  Also  gives origin to slightly large PDA and a small PDA.  The RCA is smooth  and normal.   Left main coronary:  Left main coronary is a large caliber vessel, it  appeared smooth and normal.   Circumflex coronary artery:  Circumflex coronary artery is a moderate  caliber vessel, smooth and normal.   LAD:  LAD is a large caliber vessel, except for a 20% mid stenosis.  There is no significant plaque noted.   There was no significant coronary calcification noted.   Abdominal aortogram:  Abdominal aortogram revealed tortuous abdominal  aorta, but no evidence of abdominal aortic aneurysm.  Renal arteries  were patent.   IMPRESSION:  No significant coronary artery disease by cardiac  catheterization.  Beautiful coronary artery seen in exclamation.  No  significant coronary calcification.   RECOMMENDATIONS:  The patient will be discharged home in the morning on  Coumadin without any Lovenox bridging as she spontaneously has converted  back to sinus rhythm.  Given this we can load her Coumadin as an  outpatient basis.  Either Dr. Murray Hodgkins or myself can followup with  the Coumadin in the [facility].   A total of 150 mL of contrast was utilized for diagnostic angiography.   DETAILED PROCEDURE:  Under usual sterile precautions using a 6-French  right femoral arterial access, a 6-French multipurpose B2 catheter was  advanced in the ascending aorta and then to the left ventricle.  Left  ventriculography was performed both in LAO and RAO projection.  Catheter  pulled in the ascending and right coronary artery selectively engaged  and angiography was performed.  Then, the left main coronary was  selectively engaged and angiography was performed.  Then, the catheter  was pulled in the root of aorta and  ascending aortogram was performed in  the LAO projection.  Catheter was pulled back in the abdominal aorta and  abdominal aortogram was performed.  Then, the catheter was pulled out of  the body.  Right femoral arteriography was performed through the  arterial access sheath and the access closed with StarClose with  excellent hemostasis.  No immediate complications noted.      Cristy Hilts. Jacinto Halim, MD  Electronically Signed     JRG/MEDQ  D:  08/27/2007  T:  08/28/2007  Job:  [id number]   cc:   Lenon Curt. Chilton Si, M.D.

## 2010-09-06 NOTE — Discharge Summary (Signed)
NAMECAMI, [name]           ACCOUNT NO.:  [id number]   MEDICAL RECORD NO.:  [id number]          PATIENT TYPE:  INP   LOCATION:  2725                         FACILITY:  [facility]   PHYSICIAN:  Isidor Holts, M.D.  DATE OF BIRTH:  1927-10-26   DATE OF ADMISSION:  01/24/2006  DATE OF DISCHARGE:  01/25/2006                                 DISCHARGE SUMMARY   PRIMARY MEDICAL DOCTOR:  Dr. Frederik Pear, [facility].   DISCHARGE DIAGNOSES:  1. Severe constipation.  2. History of chronic constipation.  3. Hemorrhoids.  4. Dyslipidemia.  5. Recently treated urinary tract infection.   DISCHARGE MEDICATIONS:  1. Metamucil 2 tablespoons p.o. daily.  2. Lipitor 10 mg p.o. every other day.  3. Multivitamin 1 p.o. daily.  4. MiraLax 17 g daily for 1 week, then p.r.n.  5. Anusol rectal suppositories.  Insert 1 p.r. b.i.d. x1 week only.   PROCEDURES:  1. Chest x-ray dated January 24, 2006.  This showed no acute      cardiopulmonary disease.  2. Abdominal x-ray dated January 24, 2006.  This showed a large amount of      stool throughout the colon, especially over the rectum.   CONSULTATIONS:  None.   ADMISSION HISTORY:  As in H&P note of January 24, 2006.  However, in brief,  this is a 77 year old female, who presents with a 1-week history of severe  constipation, i.e., had not moved her bowels for 1 week, following a 10-day  treatment for urinary tract infection.  She has a history of chronic  constipation, however, this had been responsive to Metamucil, which she has  been using over the long-term.  In the past 1 week, she has been vigorously  treated by her PMD with multiple stool softeners/laxatives without any  effect.  Patient, therefore, presented to the emergency department, where  she was admitted for further evaluation, investigation, and management.   1. Severe constipation:  This likely occurred against a background of poor      oral fluid intake.  She was managed with  laxatives, including MiraLax,      Dulcolax rectal suppositories, as well as p.r.n. Fleet enema.  Thyroid      profile was done, and patient found to be euthyroid with a TSH of      4.416.  She was also managed with intravenous fluid hydration as well      as Reglan.  These measures resulted in very good bowel movements and by      a.m. of January 25, 2006 patient felt completely relieved and      asymptomatic.  Her hydration status was fair, and she was felt      sufficiently recovered to be discharged.  She has been recommend to      continue her Metamucil at pre-admission dosages, but to utilize MiraLax      on a daily basis for 1 week and then subsequently p.r.n.  She has also      been admonished to maintain adequate hydration.   1. Hemorrhoids:  The patient has a known history of hemorrhoids.  She was      managed for this with Anusol rectal suppositories.   1. Dyslipidemia:  The patient continues on Statin treatment.   1. Recent urinary tract infection:  The patient was treated from January 31, 2006 to February 10, 2006 with a course of ciprofloxacin for urinary      tract infection.  Urinalysis during this admission was negative,      consistent with complete resolution of infection.  She has been      reassured accordingly.   DISPOSITION:  Patient was considered stable for discharge on January 25, 2006.   DIET:  Healthy heart diet.   ACTIVITY:  As tolerated.   WOUND CARE:  Not applicable.   PAIN MANAGEMENT:  Not applicable.   FOLLOWUP INSTRUCTIONS:  She has been instructed to follow up routinely with  her primary MD, Dr. Frederik Pear.      Isidor Holts, M.D.  Electronically Signed     CO/MEDQ  D:  01/25/2006  T:  01/26/2006  Job:  [id number]   cc:   Lenon Curt. Chilton Si, M.D.

## 2010-09-06 NOTE — Op Note (Signed)
[name], [name]           ACCOUNT NO.:  [id number]   MEDICAL RECORD NO.:  [id number]          PATIENT TYPE:  AMB   LOCATION:  SDC                           FACILITY:  [facility]   PHYSICIAN:  Freddy Finner, M.D.   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   OPERATION:  Wide excision of scar from excision of dysplastic nevus.   SURGEON:  Freddy Finner, M.D.   ANESTHESIA:  Measured intravenous sedation and local block.   COMPLICATIONS:  None.   ESTIMATED BLOOD LOSS:  Less than or equal to 10 mL.   COMPLICATIONS:  None.   The patient is a 77 year old who had a pigmented vulva lesion that was  biopsied in the office and showed an __________ dysplastic nevus with severe  atypia/melanoma in situ.  There was a positive margin on this biopsy and the  patient is now admitted for a wide excision.   She was admitted on the morning of surgery, brought to the operating room  after receiving an antibiotic bolus IV Rocephin 1 g.  She was placed under  adequate intravenous sedation, placed in dorsal lithotomy position using an  [employer] system.  Betadine prep of the mons, perineum and vagina was  carried out in the usual fashion, sterile drapes were applied.  The site of  the previous biopsy on the right vulva was anesthetized with 1% plain  Xylocaine.  An elliptical shaped vertically oriented incision was made  around the old biopsy scar and carried sharply down to a depth of  approximately 5-6 mm.  This was completely excised and submitted for  histologic examination. Deep sutures of interrupted 3-0 Dexon were placed. A  subcuticular stitch of 3-0  Dexon was then placed.  This produced adequate  complete hemostasis and skin closure.  The procedure at this point was  terminated. The patient was awakened and taken to the recovery room in good  condition.     Hosie Spangle   WRN/MEDQ  D:  03/26/2004  T:  03/26/2004  Job:  [id number]

## 2010-09-06 NOTE — H&P (Signed)
[name], [name]           ACCOUNT NO.:  [id number]   MEDICAL RECORD NO.:  [id number]          PATIENT TYPE:  INP   LOCATION:  1322                         FACILITY:  [facility]   PHYSICIAN:  Della Goo, M.D. DATE OF BIRTH:  04-20-1928   DATE OF ADMISSION:  02/07/2006  DATE OF DISCHARGE:                                HISTORY & PHYSICAL   CHIEF COMPLAINT:  Right shoulder pain status post fall.   HISTORY OF PRESENT ILLNESS:  This is a 77 year old female who presented to  the emergency department secondary to complaints of increased right shoulder  pain after suffering a fall prior to admission.  The patient reports having  increased weakness and falling while trying to go up the concrete stairs  outside her home and falling onto her right shoulder.  The patient reports  having nausea, vomiting and diarrhea for 2 days and having increased  weakness.  She denies having any chest pain, shortness of breath or syncope  related to this incident.  She does also report having fevers and chills and  abdominal pain.   The patient did have a recent varicose vein surgery of the left lower leg 5  days ago and does report that one area of the surgical wound is draining.  The patient was called in a prescription for Levaquin therapy secondary to  this.  The patient reports not picking up the Levaquin prescription  secondary to her symptoms of nausea, vomiting and diarrhea.   PAST MEDICAL HISTORY:  1. History of hyperlipidemia.  2. History of constipation.  3. Small bowel obstruction.   PAST SURGICAL HISTORY:  Status post varicose vein stripping of the right  lower extremity.   MEDICATIONS:  1. Lipitor 10 mg p.o. q.o.d.  2. Baby aspirin one p.o. daily.  3. MiraLax daily.  4. Metamucil.   ALLERGIES:  AMOXICILLIN CAUSING ANGIOEDEMA.   REVIEW OF SYSTEMS:  Pertinents mentioned above.   FAMILY HISTORY:  Noncontributory.   SOCIAL HISTORY:  The patient is a widow x6 months.   PHYSICAL EXAMINATION:  A 77 year old well-nourished, well-developed female  in mild discomfort, but no acute distress.  VITAL SIGNS:  Temperature 98.2, blood pressure 77/51, heart rate 77,  respirations 18.  O2 saturation 96% on room air.  HEENT:  Normocephalic, atraumatic.  Pupils equal, round, reactive to light.  Extraocular muscles are intact.  Funduscopic benign.  Oropharynx is clear.  Oral mucosa is dry.  No exudate.  NECK:  Supple.  Full range of motion. No thyromegaly, adenopathy, jugular  venous distention.  CARDIOVASCULAR:  Regular rate and rhythm.  LUNGS:  Clear to auscultation bilaterally.  ABDOMEN:  Positive bowel sounds, soft, nontender, nondistended.  EXTREMITIES:  Without edema.  Right upper extremity currently in a shoulder  sling.  NEUROLOGIC:  Alert and oriented x3.  Cranial nerves are intact.  There are  no focal deficits.   LABORATORY DATA:  Reveal a sodium level 132, a potassium of 3.5, chloride  99, CO2 24, BUN 12, creatinine 0.8, glucose 114, calcium 9.0.  X-ray of the  right humerus reveals an impacted humeral head fracture.   ASSESSMENT:  A 77 year old female with diagnosis of:  1. Acute fracture of the right humeral head.  2. Acute gastroenteritis.  3. Dehydration.  4. Hypotension.  5. Hyponatremia.  6. History of hyperlipidemia.   PLAN:  The patient has been admitted.  Has been placed on medications for  pain control and will be rehydrated and her electrolytes will be corrected.  She will be treated for her gastroenteritis symptoms as needed as well.  She  has been placed on DVT and GI prophylaxis.  She will also continue on her  regular medications at this time.      Della Goo, M.D.  Electronically Signed     HJ/MEDQ  D:  02/08/2006  T:  02/09/2006  Job:  [id number]   cc:   Lenon Curt. Chilton Si, M.D.  Fax: [phone]

## 2010-09-06 NOTE — H&P (Signed)
[name], [name] NO.:  [id number]   MEDICAL RECORD NO.:  [id number]          PATIENT TYPE:  EMS   LOCATION:  MAJO                         FACILITY:  [facility]   PHYSICIAN:  Isidor Holts, M.D.  DATE OF BIRTH:  10-Aug-1927   DATE OF ADMISSION:  01/24/2006  DATE OF DISCHARGE:                                HISTORY & PHYSICAL   CHIEF COMPLAINT:  Severe constipation.   HISTORY OF PRESENT ILLNESS:  This is a 77 year old female.  For past medical  history, see below.  According to the patient, she has had a history of  chronic constipation, but usually this has been responding to Metamucil  taken on a daily basis.  On January 01, 2006, however,  she had developed  a urinary tract infection.  Was treated with a 10 day course of  Ciprofloxacin by her primary M.D. and completed the treatment on January 10, 2006.  From January 15, 2006, she has been unable to move her bowels  and has been having crampy lower abdominal pain, associated with nausea, but  no vomiting.  She has been in touch with her primary M.D. and over the past  one week has utilized Senokot, Amitiza and Lactulose, all to no avail.  She  has also been having some dysuria, but no urinary frequency.  Symptoms  appear unabated, and she came to the emergency department.  She has been  evaluated by ED M.D., who also performed a rectal examination, which  confirmed hemorrhoids as well as fecal impaction.   PAST MEDICAL HISTORY:  1. Chronic constipation.  2. Hemorrhoids.  3. Dyslipidemia.  4. Recent urinary tract infection, treated with a 10 day course of      Ciprofloxacin.   MEDICATIONS:  1. Metamucil 2 teaspoons p.o. daily.  2. Lipitor 10 mg p.o. every other day.  3. Multivitamins one p.o. daily.  4. Calcium.  The patient is only intermittently compliant.  5. Ciprofloxacin 500 mg p.o. b.i.d. from January 01, 2006 to January 11, 2006.   ALLERGIES:  AMOXICILLIN.  THIS CAUSES  ANGIOEDEMA.   REVIEW OF SYSTEMS:  Essentially as per HPI and chief complaint.  Otherwise  negative.  The patient denies fever or chills, cough or shortness of breath,  or any other symptoms.   SOCIAL HISTORY:  The patient is retired.  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 She is widowed now for the past 5 months.  Nonsmoker.  Drinks alcohol only occasionally.  Has no history of drug abuse.  The patient has two daughters, both of whom are alive and well, although one  has scoliosis.  One of the patient's sisters, died of breast cancer.  She  has a brother who is alive and well.   FAMILY HISTORY:  Otherwise noncontributory.   PHYSICAL EXAMINATION:  VITALS:  Temperature 97.7, pulse 64 per minute and  regular, respiratory rate 28, BP 129/72 mmHg, pulse oximeter 99% on room  air.  The patient appears somewhat anxious, however, does not appear to be  in acute obvious discomfort, and is not  short of breath at rest.  HEENT:  No clinical pallor.  No jaundice.  No conjunctival injection.  NECK: Supple.  JVP not seen.  No palpable lymphadenopathy.  No palpable  goiter.  CHEST:  Clinically clear to auscultation.  No wheezes or crackles.  HEART:  Sounds 1 and 2 heard. normal, regular, no murmurs.  ABDOMEN:  Full, soft nontender.  No palpable organomegaly.  No palpable  masses.  Bowel sounds appear quite brisk.  LOWER EXTREMITIES: No pitting edema.  Palpable peripheral pulses.  RECTAL:  Not done, as already done by ED M.D.  Reportedly revealed fecal  impaction/hemorrhoids.  MUSCULOSKELETAL:  Quite unremarkable.  CENTRAL NERVOUS SYSTEM:  No focal neurologic deficits on gross examination.   INVESTIGATIONS:  CBC; WBC 5.3, hemoglobin 12.6, hematocrit 37.5, platelets  250.  Electrolytes; sodium 131, potassium 4.5, chloride 97, CO2 29, BUN 3,  creatinine 0.7, glucose 97.  Urinalysis is negative, i.e. specific gravity  1.005, pH 7.0, nitrites negative, leukocyte esterase small.   WBC 3 to 6.  RBC 0 to 2.  Bacteria rare.  Chest x-ray/abdominal x-ray dated January 24, 2006 showed no active pulmonary process.  There was a large amount of stool  throughout the colon, especially over the rectum.   ASSESSMENT/PLAN:  1. Severe constipation.  Acute on chronic. The patient has failed      outpatient management over the past one week, and has received an enema      in the emergency department today, without obvious results.  We will      admit the patient, rehydrate with intravenous fluids, utilize      intravenous Reglan, administer p.r.n. enema's, and continue the patient      on regular stool softener/suppositories.  We should also check TSH for      completeness, to rule out hypothyroidism.   1. Hemorrhoids.  This may also be militating against the patient moving      her bowels adequately.  We shall utilize Anusol rectal suppositories.   1. Dyslipidemia.  The patient will continue Statin treatment.   Further management will depend on clinical course.      Isidor Holts, M.D.  Electronically Signed     CO/MEDQ  D:  01/24/2006  T:  01/26/2006  Job:  [id number]   cc:   Lenon Curt. Chilton Si, M.D.

## 2010-09-09 ENCOUNTER — Encounter (HOSPITAL_BASED_OUTPATIENT_CLINIC_OR_DEPARTMENT_OTHER): Payer: Medicare Other | Admitting: Licensed Clinical Social Worker

## 2010-09-09 DIAGNOSIS — F411 Generalized anxiety disorder: Secondary | ICD-10-CM

## 2010-09-23 ENCOUNTER — Encounter: Payer: Self-pay | Admitting: Family Medicine

## 2010-09-23 ENCOUNTER — Ambulatory Visit (INDEPENDENT_AMBULATORY_CARE_PROVIDER_SITE_OTHER): Payer: Medicare Other | Admitting: Family Medicine

## 2010-09-23 DIAGNOSIS — M25551 Pain in right hip: Secondary | ICD-10-CM

## 2010-09-23 DIAGNOSIS — J449 Chronic obstructive pulmonary disease, unspecified: Secondary | ICD-10-CM | POA: Insufficient documentation

## 2010-09-23 DIAGNOSIS — M25559 Pain in unspecified hip: Secondary | ICD-10-CM | POA: Insufficient documentation

## 2010-09-23 MED ORDER — BECLOMETHASONE DIPROPIONATE 40 MCG/ACT IN AERS: 2.0000 | INHALATION_SPRAY | Freq: Two times a day (BID) | RESPIRATORY_TRACT | Status: DC

## 2010-09-23 NOTE — Progress Notes (Signed)
  Subjective:    Patient ID: [name], female    DOB: Feb 06, 1928, 77 y.o.   MRN: [id number]  HPI R hip pain- pt reports she has had multiple bone scans done by interventional radiologist w/out indication as to what's causing pain.  Does have known compression fx in spine.  Was using cane on R side and intially thought this was the cause.  Also has pain in R flank.  Pain has been present since Nov.  Pain will worsen w/ increased activity.  Has never seen ortho.  Reports leg will 'give out'.  SOB- reports sxs are worse in the AM and overnight.  Will use inhaler daily.  Denies increased cough, wheezing but reports others have told her she is wheezing.  Has started exercising in hopes of improving lung capacity.   Review of Systems For ROS see HPI     Objective:   Physical Exam  Constitutional: She appears well-developed and well-nourished. No distress.  HENT:  Head: Normocephalic and atraumatic.  Neck: Normal range of motion. Neck supple. No thyromegaly present.  Cardiovascular: Intact distal pulses.        Irregular S1/S2  Pulmonary/Chest: Effort normal and breath sounds normal. No respiratory distress. She has no wheezes. She has no rales.  Abdominal: Soft. Bowel sounds are normal. She exhibits no distension. There is no tenderness.  Musculoskeletal: She exhibits tenderness. She exhibits no edema.       R hip w/ TTP over trochanteric bursa, limited internal rotation, better external rotation.  Fairly good hip flexion.  Lymphadenopathy:    She has no cervical adenopathy.          Assessment & Plan:

## 2010-09-23 NOTE — Patient Instructions (Signed)
Please schedule your complete physical at your convenience- do not eat before this appt Start the Qvar- 2 puffs twice a day EVERY DAY to prevent shortness of breath Use the Ventolin as a rescue medicine whenever your chest feels tight Take Tylenol Arthritis for your hip pain We'll call you with your orthopedic appt You have already seen Dr Shelle Iron (pulmonary) so there is no need to repeat the testing at this time Call your insurance company and see if they cover the shingles shot Call with any questions or concerns Hang in there!!

## 2010-09-30 ENCOUNTER — Encounter (HOSPITAL_COMMUNITY): Payer: Medicare Other | Admitting: Licensed Clinical Social Worker

## 2010-09-30 ENCOUNTER — Other Ambulatory Visit: Payer: Self-pay | Admitting: Family Medicine

## 2010-09-30 DIAGNOSIS — F411 Generalized anxiety disorder: Secondary | ICD-10-CM

## 2010-09-30 DIAGNOSIS — Z1231 Encounter for screening mammogram for malignant neoplasm of breast: Secondary | ICD-10-CM

## 2010-10-01 NOTE — Assessment & Plan Note (Signed)
Pt has known arthritis in her hip, has never seen ortho.  Will refer for complete evaluation.

## 2010-10-01 NOTE — Assessment & Plan Note (Signed)
Pt has had PFTs that show mild COPD.  Will start her on controller medication and refill albuterol for her.  Applauded her decision to exercise.  Will follow.

## 2010-10-02 ENCOUNTER — Telehealth: Payer: Self-pay | Admitting: *Deleted

## 2010-10-02 NOTE — Telephone Encounter (Signed)
It is very unlikely that first time use would cause blood in her mouth.  She can attempt to use the sample again but this time rinse mouth after use.  Full breath sounds like that would be a good thing.  Dizziness may be related to deep breaths associated w/ using inhaler and may improve as she works on her technique.  If blood in mouth persists she should stop using, schedule an appt w/ dentist.

## 2010-10-02 NOTE — Telephone Encounter (Signed)
Left message to call office

## 2010-10-02 NOTE — Telephone Encounter (Addendum)
Pt states that she was given a sample of Qvar which she used for the first time on yesterday. Pt note that med caused her breath to be fuller, some dizziness and her mouth to be sore. Pt also indicated that she noticed some blood in mouth after using inhaler. Pt has already picked up Rx for med as well but has not opened it because she does not like the way med is making her feel. Please advise

## 2010-10-02 NOTE — Telephone Encounter (Signed)
Pt.notified

## 2010-10-14 ENCOUNTER — Encounter (HOSPITAL_BASED_OUTPATIENT_CLINIC_OR_DEPARTMENT_OTHER): Payer: Medicare Other | Admitting: Licensed Clinical Social Worker

## 2010-10-14 DIAGNOSIS — F411 Generalized anxiety disorder: Secondary | ICD-10-CM

## 2010-10-28 ENCOUNTER — Ambulatory Visit
Admission: RE | Admit: 2010-10-28 | Discharge: 2010-10-28 | Disposition: A | Discharge status: Home / Self Care | Payer: Medicare Other | Source: Ambulatory Visit | Attending: Family Medicine | Admitting: Family Medicine

## 2010-10-28 ENCOUNTER — Encounter (HOSPITAL_BASED_OUTPATIENT_CLINIC_OR_DEPARTMENT_OTHER): Payer: Medicare Other | Admitting: Licensed Clinical Social Worker

## 2010-10-28 DIAGNOSIS — Z1231 Encounter for screening mammogram for malignant neoplasm of breast: Secondary | ICD-10-CM

## 2010-10-28 DIAGNOSIS — F411 Generalized anxiety disorder: Secondary | ICD-10-CM

## 2010-11-01 ENCOUNTER — Encounter: Payer: Self-pay | Admitting: *Deleted

## 2010-11-01 ENCOUNTER — Encounter: Payer: Self-pay | Admitting: Family Medicine

## 2010-11-01 ENCOUNTER — Ambulatory Visit (INDEPENDENT_AMBULATORY_CARE_PROVIDER_SITE_OTHER): Payer: Medicare Other | Admitting: Family Medicine

## 2010-11-01 DIAGNOSIS — R413 Other amnesia: Secondary | ICD-10-CM

## 2010-11-01 DIAGNOSIS — J449 Chronic obstructive pulmonary disease, unspecified: Secondary | ICD-10-CM

## 2010-11-01 DIAGNOSIS — J4489 Other specified chronic obstructive pulmonary disease: Secondary | ICD-10-CM

## 2010-11-01 DIAGNOSIS — M25551 Pain in right hip: Secondary | ICD-10-CM

## 2010-11-01 DIAGNOSIS — Z13220 Encounter for screening for lipoid disorders: Secondary | ICD-10-CM

## 2010-11-01 DIAGNOSIS — IMO0002 Reserved for concepts with insufficient information to code with codable children: Secondary | ICD-10-CM

## 2010-11-01 DIAGNOSIS — M25559 Pain in unspecified hip: Secondary | ICD-10-CM

## 2010-11-01 DIAGNOSIS — K219 Gastro-esophageal reflux disease without esophagitis: Secondary | ICD-10-CM | POA: Insufficient documentation

## 2010-11-01 DIAGNOSIS — N390 Urinary tract infection, site not specified: Secondary | ICD-10-CM

## 2010-11-01 DIAGNOSIS — T148XXA Other injury of unspecified body region, initial encounter: Secondary | ICD-10-CM

## 2010-11-01 LAB — CBC WITH DIFFERENTIAL/PLATELET
Eosinophils Relative: 1.9 % (ref 0.0–5.0)
HCT: 38.1 % (ref 36.0–46.0)
Lymphs Abs: 1.5 10*3/uL (ref 0.7–4.0)
MCV: 94.6 fl (ref 78.0–100.0)
Monocytes Absolute: 0.6 10*3/uL (ref 0.1–1.0)
Platelets: 215 10*3/uL (ref 150.0–400.0)
WBC: 6.1 10*3/uL (ref 4.5–10.5)

## 2010-11-01 LAB — LIPID PANEL
Cholesterol: 181 mg/dL (ref 0–200)
HDL: 72.2 mg/dL (ref >39.00)
Triglycerides: 53 mg/dL (ref 0.0–149.0)

## 2010-11-01 LAB — BASIC METABOLIC PANEL
BUN: 12 mg/dL (ref 6–23)
Calcium: 9.1 mg/dL (ref 8.4–10.5)
GFR: 63.1 mL/min (ref >60.00)
Glucose, Bld: 82 mg/dL (ref 70–99)
Sodium: 136 mEq/L (ref 135–145)

## 2010-11-01 LAB — HEPATIC FUNCTION PANEL
Albumin: 4.3 g/dL (ref 3.5–5.2)
Alkaline Phosphatase: 74 U/L (ref 39–117)

## 2010-11-01 LAB — TSH: TSH: 1.49 u[IU]/mL (ref 0.35–5.50)

## 2010-11-01 MED ORDER — ALPRAZOLAM 0.5 MG PO TABS: ORAL_TABLET | ORAL | Status: DC

## 2010-11-01 NOTE — Progress Notes (Signed)
  Subjective:    Patient ID: [name], female    DOB: 1927-08-06, 77 y.o.   MRN: [id number]  HPI Cuts on lower legs- occur every summer, derm told her it was her toenails scratching her legs and told her to wear socks at night.  Pt again has small scratches.  Denies pain, redness, drainage, or other signs of infxn  COPD- not taking Qvar, is walking a 1/4 mile daily and feels her breathing has improved considerably.  Has not required use of albuterol inhaler recently.  R leg pain- has been present since Nov, seeing Dr [name].  Has MRI tomorrow morning.  Pt had MRI 3 months ago and got claustrophobic.  Is wondering if she is able to have a sedative.  Recurrent UTIs- had issues last summer and was started on Trimethoprim daily by [facility].  Wants to know if she is able to stop meds due to cost.  Denies current sxs of dysuria, frequency, urgency, hesitancy.  GERD- stopped the Omeprazole and is using Pepcid.  'i still have burps but everything seems to be getting better since i started exercising'  Memory loss- pt admits she's having more problems recently. Children would like her to move back to [place].  She is hesitant due to cost of living but has no family here and says she can't afford a NH and fears that may be where she's heading.  Has seen neuro.  Not interested in additional w/u at this time.   Review of Systems For ROS see HPI     Objective:   Physical Exam  Vitals reviewed. Constitutional: She appears well-developed and well-nourished. No distress.  HENT:  Head: Normocephalic and atraumatic.  Mouth/Throat: Oropharynx is clear and moist.  Eyes: Conjunctivae and EOM are normal. Pupils are equal, round, and reactive to light.  Neck: Normal range of motion. Neck supple. No thyromegaly present.  Cardiovascular: Normal rate and intact distal pulses.        Irregular S1/S2  Pulmonary/Chest: Effort normal and breath sounds normal. No respiratory distress. She has no wheezes. She has  no rales.  Abdominal: Soft. Bowel sounds are normal. She exhibits no distension. There is no tenderness. There is no rebound.  Musculoskeletal: She exhibits no edema.  Lymphadenopathy:    She has no cervical adenopathy.  Neurological: She is alert. No cranial nerve deficit.  Skin: Skin is warm and dry.       Multiple small superficial abrasions/scratches on lower legs bilaterally.  No signs of infxn present  Psychiatric: Her behavior is normal. Judgment normal.          Assessment & Plan:

## 2010-11-01 NOTE — Patient Instructions (Signed)
We'll notify you of your lab results and determine when you need to follow up Keep up the good work on diet and exercise The scratches on your legs are not concerning Take the Alprazolam before the MRI tomorrow- it should work right away Call with any questions or concerns Have a great summer!

## 2010-11-06 ENCOUNTER — Encounter (INDEPENDENT_AMBULATORY_CARE_PROVIDER_SITE_OTHER): Payer: Medicare Other | Admitting: Psychiatry

## 2010-11-06 DIAGNOSIS — F411 Generalized anxiety disorder: Secondary | ICD-10-CM

## 2010-11-08 ENCOUNTER — Encounter (HOSPITAL_COMMUNITY): Payer: Medicare Other | Admitting: Psychiatry

## 2010-11-11 ENCOUNTER — Encounter (HOSPITAL_BASED_OUTPATIENT_CLINIC_OR_DEPARTMENT_OTHER): Payer: Medicare Other | Admitting: Licensed Clinical Social Worker

## 2010-11-11 DIAGNOSIS — F411 Generalized anxiety disorder: Secondary | ICD-10-CM

## 2010-11-17 DIAGNOSIS — T148XXA Other injury of unspecified body region, initial encounter: Secondary | ICD-10-CM | POA: Insufficient documentation

## 2010-11-17 NOTE — Assessment & Plan Note (Signed)
Pt to stop her suppressive medication when her current prescription runs out.  If she again has difficulty w/ recurrent infxn will discuss restarting meds.  Pt expressed understanding and is in agreement w/ plan.

## 2010-11-17 NOTE — Assessment & Plan Note (Signed)
No longer using controller medication and not requiring rescue inhaler.  Reports sxs have improved since she began exercising.  Will continue to follow.

## 2010-11-17 NOTE — Assessment & Plan Note (Signed)
Check labs and assess if pt requires meds.

## 2010-11-17 NOTE — Assessment & Plan Note (Signed)
Has MRI upcoming.  Medicine provided to relieve anxiety.

## 2010-11-17 NOTE — Assessment & Plan Note (Signed)
Uncertain of cause but no reason for concern as they are not infected.  Pt expressed understanding and is in agreement w/ plan.

## 2010-11-17 NOTE — Assessment & Plan Note (Signed)
Strongly encouraged pt to consider moving back home to be close to her daughters if she feels she is not doing well.  Discussed cost of living in [place] vs cost of nursing home.  Pt aware she cannot afford NH and would have some assistance if she moved back to [place].  She will consider this and again discuss it w/ family.  Will follow and assist as able.

## 2010-11-17 NOTE — Assessment & Plan Note (Signed)
Pt has switched meds due to cost.  Feels sxs are fairly well controlled.

## 2010-11-26 ENCOUNTER — Encounter (HOSPITAL_BASED_OUTPATIENT_CLINIC_OR_DEPARTMENT_OTHER): Payer: Medicare Other | Admitting: Licensed Clinical Social Worker

## 2010-11-26 DIAGNOSIS — F411 Generalized anxiety disorder: Secondary | ICD-10-CM

## 2010-12-03 ENCOUNTER — Emergency Department (HOSPITAL_COMMUNITY): Payer: Medicare Other

## 2010-12-03 ENCOUNTER — Emergency Department (HOSPITAL_COMMUNITY)
Admission: EM | Admit: 2010-12-03 | Discharge: 2010-12-03 | Disposition: A | Discharge status: Home / Self Care | Payer: Medicare Other | Attending: Emergency Medicine | Admitting: Emergency Medicine

## 2010-12-03 DIAGNOSIS — R079 Chest pain, unspecified: Secondary | ICD-10-CM | POA: Insufficient documentation

## 2010-12-03 DIAGNOSIS — E785 Hyperlipidemia, unspecified: Secondary | ICD-10-CM | POA: Insufficient documentation

## 2010-12-03 DIAGNOSIS — R5381 Other malaise: Secondary | ICD-10-CM | POA: Insufficient documentation

## 2010-12-03 DIAGNOSIS — Z79899 Other long term (current) drug therapy: Secondary | ICD-10-CM | POA: Insufficient documentation

## 2010-12-03 DIAGNOSIS — F411 Generalized anxiety disorder: Secondary | ICD-10-CM | POA: Insufficient documentation

## 2010-12-03 DIAGNOSIS — K219 Gastro-esophageal reflux disease without esophagitis: Secondary | ICD-10-CM | POA: Insufficient documentation

## 2010-12-03 DIAGNOSIS — R0602 Shortness of breath: Secondary | ICD-10-CM | POA: Insufficient documentation

## 2010-12-03 DIAGNOSIS — M129 Arthropathy, unspecified: Secondary | ICD-10-CM | POA: Insufficient documentation

## 2010-12-03 DIAGNOSIS — I4891 Unspecified atrial fibrillation: Secondary | ICD-10-CM | POA: Insufficient documentation

## 2010-12-03 DIAGNOSIS — R42 Dizziness and giddiness: Secondary | ICD-10-CM | POA: Insufficient documentation

## 2010-12-03 LAB — CBC
HCT: 38.9 % (ref 36.0–46.0)
Hemoglobin: 13.3 g/dL (ref 12.0–15.0)
MCH: 31.7 pg (ref 26.0–34.0)
MCHC: 34.2 g/dL (ref 30.0–36.0)

## 2010-12-03 LAB — COMPREHENSIVE METABOLIC PANEL
Albumin: 3.6 g/dL (ref 3.5–5.2)
Alkaline Phosphatase: 75 U/L (ref 39–117)
BUN: 13 mg/dL (ref 6–23)
Creatinine, Ser: 0.78 mg/dL (ref 0.50–1.10)
GFR calc Af Amer: >60 mL/min (ref >60)
Glucose, Bld: 95 mg/dL (ref 70–99)
Potassium: 4.1 mEq/L (ref 3.5–5.1)
Total Protein: 6.7 g/dL (ref 6.0–8.3)

## 2010-12-03 LAB — DIFFERENTIAL
Lymphocytes Relative: 20 % (ref 12–46)
Lymphs Abs: 1.5 10*3/uL (ref 0.7–4.0)
Monocytes Absolute: 0.6 10*3/uL (ref 0.1–1.0)
Monocytes Relative: 8 % (ref 3–12)
Neutro Abs: 5.1 10*3/uL (ref 1.7–7.7)

## 2010-12-03 LAB — CK TOTAL AND CKMB (NOT AT ARMC)
CK, MB: 2.8 ng/mL (ref 0.3–4.0)
Relative Index: INVALID (ref 0.0–2.5)
Total CK: 74 U/L (ref 7–177)

## 2010-12-10 ENCOUNTER — Encounter (INDEPENDENT_AMBULATORY_CARE_PROVIDER_SITE_OTHER): Payer: Medicare Other | Admitting: Licensed Clinical Social Worker

## 2010-12-10 DIAGNOSIS — F411 Generalized anxiety disorder: Secondary | ICD-10-CM

## 2010-12-24 ENCOUNTER — Encounter (HOSPITAL_COMMUNITY): Payer: Medicare Other | Admitting: Licensed Clinical Social Worker

## 2010-12-30 ENCOUNTER — Encounter (HOSPITAL_COMMUNITY): Payer: Medicare Other | Admitting: Psychiatry

## 2011-01-02 ENCOUNTER — Encounter (INDEPENDENT_AMBULATORY_CARE_PROVIDER_SITE_OTHER): Payer: Medicare Other | Admitting: Licensed Clinical Social Worker

## 2011-01-02 DIAGNOSIS — F411 Generalized anxiety disorder: Secondary | ICD-10-CM

## 2011-01-13 ENCOUNTER — Encounter (INDEPENDENT_AMBULATORY_CARE_PROVIDER_SITE_OTHER): Payer: Medicare Other | Admitting: Psychiatry

## 2011-01-13 DIAGNOSIS — F411 Generalized anxiety disorder: Secondary | ICD-10-CM

## 2011-01-14 ENCOUNTER — Encounter (HOSPITAL_COMMUNITY): Payer: Medicare Other | Admitting: Licensed Clinical Social Worker

## 2011-01-22 ENCOUNTER — Encounter (INDEPENDENT_AMBULATORY_CARE_PROVIDER_SITE_OTHER): Payer: Medicare Other | Admitting: Licensed Clinical Social Worker

## 2011-01-22 DIAGNOSIS — F411 Generalized anxiety disorder: Secondary | ICD-10-CM

## 2011-01-24 LAB — URINALYSIS, ROUTINE W REFLEX MICROSCOPIC
Bilirubin Urine: NEGATIVE
Nitrite: NEGATIVE
Specific Gravity, Urine: 1.012
Urobilinogen, UA: 0.2

## 2011-01-24 LAB — BASIC METABOLIC PANEL
BUN: 7
CO2: 31
CO2: 32
Chloride: 97
GFR calc non Af Amer: >60
Glucose, Bld: 85
Glucose, Bld: 85
Glucose, Bld: 90
Potassium: 3.6
Potassium: 4
Potassium: 4.4
Sodium: 137
Sodium: 139
Sodium: 140

## 2011-01-24 LAB — URINE CULTURE: Culture: NO GROWTH

## 2011-01-24 LAB — MAGNESIUM: Magnesium: 1.4 — ABNORMAL LOW

## 2011-01-24 LAB — CBC
HCT: 24.9 — ABNORMAL LOW
HCT: 32.5 — ABNORMAL LOW
HCT: 32.9 — ABNORMAL LOW
HCT: 33 — ABNORMAL LOW
Hemoglobin: 10.9 — ABNORMAL LOW
Hemoglobin: 11 — ABNORMAL LOW
Hemoglobin: 11.1 — ABNORMAL LOW
Hemoglobin: 8.1 — ABNORMAL LOW
MCHC: 32.7
MCHC: 33.7
MCV: 87.1
MCV: 87.5
Platelets: 269
Platelets: 279
RBC: 3.79 — ABNORMAL LOW
RDW: 15.7 — ABNORMAL HIGH
RDW: 16.2 — ABNORMAL HIGH
WBC: 6.9
WBC: 8

## 2011-01-24 LAB — LIPID PANEL: VLDL: 7

## 2011-01-24 LAB — VITAMIN B12: Vitamin B-12: 373 (ref 211–911)

## 2011-01-24 LAB — COMPREHENSIVE METABOLIC PANEL
Albumin: 2 — ABNORMAL LOW
BUN: 5 — ABNORMAL LOW
Calcium: 7.9 — ABNORMAL LOW
Glucose, Bld: 91
Potassium: 2.5 — CL
Sodium: 140
Total Protein: 5 — ABNORMAL LOW

## 2011-01-24 LAB — IRON AND TIBC
Iron: 11 — ABNORMAL LOW
TIBC: 159 — ABNORMAL LOW
UIBC: 148

## 2011-01-24 LAB — CULTURE, BLOOD (ROUTINE X 2): Culture: NO GROWTH

## 2011-01-24 LAB — CROSSMATCH

## 2011-01-24 LAB — FERRITIN: Ferritin: 160 (ref 10–291)

## 2011-01-24 LAB — URINE MICROSCOPIC-ADD ON

## 2011-01-24 LAB — B-NATRIURETIC PEPTIDE (CONVERTED LAB): Pro B Natriuretic peptide (BNP): 328 — ABNORMAL HIGH

## 2011-01-24 LAB — TSH: TSH: 4.464

## 2011-01-24 LAB — FOLATE: Folate: 10.6

## 2011-01-28 ENCOUNTER — Encounter (INDEPENDENT_AMBULATORY_CARE_PROVIDER_SITE_OTHER): Payer: Medicare Other | Admitting: Licensed Clinical Social Worker

## 2011-01-28 DIAGNOSIS — F411 Generalized anxiety disorder: Secondary | ICD-10-CM

## 2011-01-28 LAB — COMPREHENSIVE METABOLIC PANEL
ALT: 32
ALT: 41 — ABNORMAL HIGH
AST: 27
AST: 23
AST: 26
AST: 35
Albumin: 1.6 — ABNORMAL LOW
Albumin: 1.4 — ABNORMAL LOW
Albumin: 3.3 — ABNORMAL LOW
Albumin: 4
Alkaline Phosphatase: 113
Alkaline Phosphatase: 108
BUN: 22
BUN: 14
BUN: 58 — ABNORMAL HIGH
CO2: 26
CO2: 25
Calcium: 7.2 — ABNORMAL LOW
Calcium: 7.4 — ABNORMAL LOW
Calcium: 9.3
Chloride: 108
Chloride: 110
Chloride: 110
Chloride: 99
Creatinine, Ser: 0.64
Creatinine, Ser: 0.66
Creatinine, Ser: 0.66
Creatinine, Ser: 0.71
Creatinine, Ser: 0.74
Creatinine, Ser: 2.53 — ABNORMAL HIGH
GFR calc Af Amer: >60
GFR calc Af Amer: >60
GFR calc Af Amer: >60
GFR calc Af Amer: >60
GFR calc non Af Amer: >60
GFR calc non Af Amer: >60
Glucose, Bld: 87
Glucose, Bld: 104 — ABNORMAL HIGH
Glucose, Bld: 109 — ABNORMAL HIGH
Potassium: 3.6
Potassium: 4.1
Sodium: 138
Sodium: 136
Total Bilirubin: 0.6
Total Bilirubin: 0.8
Total Bilirubin: 0.5
Total Bilirubin: 0.9
Total Protein: 3.7 — ABNORMAL LOW
Total Protein: 3.9 — ABNORMAL LOW
Total Protein: 6.6

## 2011-01-28 LAB — CULTURE, BLOOD (ROUTINE X 2)
Culture: NO GROWTH
Culture: NO GROWTH

## 2011-01-28 LAB — CBC
HCT: 25.9 — ABNORMAL LOW
HCT: 28.8 — ABNORMAL LOW
HCT: 22.7 — ABNORMAL LOW
HCT: 24.3 — ABNORMAL LOW
HCT: 25.1 — ABNORMAL LOW
HCT: 26.1 — ABNORMAL LOW
HCT: 26.7 — ABNORMAL LOW
HCT: 29.6 — ABNORMAL LOW
HCT: 34.9 — ABNORMAL LOW
Hemoglobin: 8.8 — ABNORMAL LOW
Hemoglobin: 8 — ABNORMAL LOW
Hemoglobin: 8.2 — ABNORMAL LOW
Hemoglobin: 8.4 — ABNORMAL LOW
Hemoglobin: 9.1 — ABNORMAL LOW
MCHC: 33.8
MCHC: 34
MCHC: 34.1
MCHC: 34.6
MCV: 91.1
MCV: 91.7
MCV: 92.8
MCV: 93
MCV: 93.8
MCV: 93.8
MCV: 93.9
MCV: 94.2
MCV: 94.5
Platelets: 345
Platelets: 346
Platelets: 215
Platelets: 243
Platelets: 253
Platelets: 253
Platelets: 277
Platelets: 304
RBC: 2.68 — ABNORMAL LOW
RBC: 2.83 — ABNORMAL LOW
RBC: 3.16 — ABNORMAL LOW
RDW: 14.8
RDW: 13.2
RDW: 13.3
RDW: 13.6
RDW: 13.7
RDW: 13.8
RDW: 13.9
WBC: 10.1
WBC: 15.5 — ABNORMAL HIGH
WBC: 9.8
WBC: 12.7 — ABNORMAL HIGH
WBC: 14.6 — ABNORMAL HIGH
WBC: 18.7 — ABNORMAL HIGH

## 2011-01-28 LAB — DIFFERENTIAL
Basophils Absolute: 0
Basophils Absolute: 0
Basophils Absolute: 0
Basophils Absolute: 0
Basophils Absolute: 0
Basophils Absolute: 0
Basophils Relative: 1
Basophils Relative: 0
Basophils Relative: 0
Basophils Relative: 0
Eosinophils Absolute: 0
Eosinophils Absolute: 0
Eosinophils Absolute: 0.2
Eosinophils Relative: 0
Eosinophils Relative: 0
Eosinophils Relative: 0
Eosinophils Relative: 1
Lymphocytes Relative: 15
Lymphocytes Relative: 10 — ABNORMAL LOW
Lymphocytes Relative: 3 — ABNORMAL LOW
Lymphocytes Relative: 4 — ABNORMAL LOW
Lymphocytes Relative: 6 — ABNORMAL LOW
Lymphs Abs: 0.8
Lymphs Abs: 1
Monocytes Absolute: 0.7
Monocytes Absolute: 0.1 — ABNORMAL LOW
Monocytes Absolute: 1 — ABNORMAL HIGH
Monocytes Relative: 1 — ABNORMAL LOW
Monocytes Relative: 3
Monocytes Relative: 5
Monocytes Relative: 5
Monocytes Relative: 6
Neutro Abs: 7.7
Neutro Abs: 25.8 — ABNORMAL HIGH
Neutrophils Relative %: 76
Neutrophils Relative %: 83 — ABNORMAL HIGH
Neutrophils Relative %: 91 — ABNORMAL HIGH
Neutrophils Relative %: 92 — ABNORMAL HIGH

## 2011-01-28 LAB — BASIC METABOLIC PANEL
BUN: 18
BUN: 7
BUN: 42 — ABNORMAL HIGH
BUN: 8
CO2: 23
CO2: 26
Chloride: 107
Chloride: 109
Chloride: 105
Chloride: 96
Creatinine, Ser: 0.71
Creatinine, Ser: 0.81
Creatinine, Ser: 1.79 — ABNORMAL HIGH
GFR calc Af Amer: 33 — ABNORMAL LOW
GFR calc Af Amer: 59 — ABNORMAL LOW
GFR calc non Af Amer: >60
GFR calc non Af Amer: 27 — ABNORMAL LOW
GFR calc non Af Amer: >60
Glucose, Bld: 120 — ABNORMAL HIGH
Glucose, Bld: 82
Glucose, Bld: 110 — ABNORMAL HIGH
Glucose, Bld: 125 — ABNORMAL HIGH
Glucose, Bld: 129 — ABNORMAL HIGH
Potassium: 3 — ABNORMAL LOW
Potassium: 3.7
Potassium: 4.4
Potassium: 4.5
Potassium: 4.5
Sodium: 133 — ABNORMAL LOW
Sodium: 142

## 2011-01-28 LAB — CROSSMATCH
Antibody Screen: NEGATIVE
Antibody Screen: NEGATIVE

## 2011-01-28 LAB — URINE MICROSCOPIC-ADD ON

## 2011-01-28 LAB — URINALYSIS, ROUTINE W REFLEX MICROSCOPIC
Glucose, UA: NEGATIVE
Hgb urine dipstick: NEGATIVE
Ketones, ur: NEGATIVE
Leukocytes, UA: NEGATIVE
Nitrite: POSITIVE — AB
Protein, ur: NEGATIVE
Specific Gravity, Urine: 1.021
Urobilinogen, UA: 0.2
pH: 7

## 2011-01-28 LAB — CARDIAC PANEL(CRET KIN+CKTOT+MB+TROPI)
CK, MB: 1.8
Relative Index: INVALID
Relative Index: INVALID
Troponin I: 0.06
Troponin I: 0.06

## 2011-01-28 LAB — URINE CULTURE
Colony Count: NO GROWTH
Colony Count: NO GROWTH
Culture: NO GROWTH

## 2011-01-28 LAB — ABO/RH: ABO/RH(D): O POS

## 2011-01-28 LAB — PREALBUMIN
Prealbumin: 5.6 — ABNORMAL LOW
Prealbumin: 4.5 — ABNORMAL LOW

## 2011-01-28 LAB — LIPID PANEL
Cholesterol: 172
HDL: 69
LDL Cholesterol: 88
Total CHOL/HDL Ratio: 2.5

## 2011-01-28 LAB — CLOSTRIDIUM DIFFICILE EIA
C difficile Toxins A+B, EIA: NEGATIVE
C difficile Toxins A+B, EIA: NEGATIVE

## 2011-01-28 LAB — PHOSPHORUS: Phosphorus: 3.4

## 2011-01-28 LAB — MAGNESIUM
Magnesium: 1.8
Magnesium: 1.6

## 2011-01-28 LAB — HEMOGLOBIN AND HEMATOCRIT, BLOOD: Hemoglobin: 8.9 — ABNORMAL LOW

## 2011-01-28 LAB — B-NATRIURETIC PEPTIDE (CONVERTED LAB): Pro B Natriuretic peptide (BNP): 538 — ABNORMAL HIGH

## 2011-02-03 ENCOUNTER — Ambulatory Visit (INDEPENDENT_AMBULATORY_CARE_PROVIDER_SITE_OTHER): Payer: Medicare Other | Admitting: Family Medicine

## 2011-02-03 ENCOUNTER — Encounter: Payer: Self-pay | Admitting: Family Medicine

## 2011-02-03 DIAGNOSIS — J40 Bronchitis, not specified as acute or chronic: Secondary | ICD-10-CM | POA: Insufficient documentation

## 2011-02-03 DIAGNOSIS — J310 Chronic rhinitis: Secondary | ICD-10-CM | POA: Insufficient documentation

## 2011-02-03 DIAGNOSIS — M25519 Pain in unspecified shoulder: Secondary | ICD-10-CM

## 2011-02-03 DIAGNOSIS — G20 Parkinson's disease: Secondary | ICD-10-CM

## 2011-02-03 MED ORDER — FLUTICASONE PROPIONATE 50 MCG/ACT NA SUSP: 2.0000 | Freq: Every day | NASAL | Status: DC

## 2011-02-03 MED ORDER — AZITHROMYCIN 250 MG PO TABS: 250.0000 mg | ORAL_TABLET | Freq: Every day | ORAL | Status: DC

## 2011-02-03 NOTE — Assessment & Plan Note (Signed)
Discussed that the decision to stop meds is between her and neuro and that as long as she feels her sxs are well controlled then i don't disagree w/ her choice.  Applauded her decision to join support group.  Will follow.

## 2011-02-03 NOTE — Assessment & Plan Note (Signed)
Given duration of cough and wheezes will start azithromycin.  Reviewed supportive care and red flags that should prompt return.  Pt expressed understanding and is in agreement w/ plan.

## 2011-02-03 NOTE — Assessment & Plan Note (Signed)
PND is likely contributing to pt's cough.  Start Flonase daily.

## 2011-02-03 NOTE — Progress Notes (Signed)
  Subjective:    Patient ID: [name], female    DOB: June 07, 1927, 77 y.o.   MRN: [id number]  HPI Cough- sxs started in mid-September.  Will improve and then return.  Cough returned 10 days ago and has been keeping pt awake at night.  'i've been exposed to a lot of people that have colds'.  No fever.  + nasal congestion, PND.  No ear pain.  Cough is nonproductive.  Shoulder pain- L sided, will radiate into upper arm.  Will hurt daily for 5 minutes- sharp pain.  No particular time of day.  Sees Dr Jacinto Halim (Cards).  Carbidopa was stopped- pt wonders if this is Parkinson's related.  Has seen ortho on [place] previously.  Parkinson's- stopped her Carbidopa, sees Dr Oswaldo Conroy regularly.  Is keeping a 3 months journal of sxs.  Now in a support group.  Wants to know what i think about stopping meds   Review of Systems For ROS see HPI     Objective:   Physical Exam  Vitals reviewed. Constitutional: She is oriented to person, place, and time. She appears well-developed and well-nourished. No distress.  HENT:  Head: Normocephalic and atraumatic.       TMs normal bilaterally Edematous nasal turbinates Copious PND  Eyes: Conjunctivae and EOM are normal. Pupils are equal, round, and reactive to light.  Neck: Normal range of motion. Neck supple.  Cardiovascular: Normal rate and regular rhythm.   Pulmonary/Chest: Effort normal. No respiratory distress. She has wheezes (diffuse end expiratory wheezes). She has no rales.  Musculoskeletal: She exhibits tenderness (over L AC joint, worse w/ motions that impinge).  Lymphadenopathy:    She has no cervical adenopathy.  Neurological: She is alert and oriented to person, place, and time. No cranial nerve deficit. Coordination normal.  Skin: Skin is warm and dry.  Psychiatric: She has a normal mood and affect. Her behavior is normal.          Assessment & Plan:

## 2011-02-03 NOTE — Assessment & Plan Note (Signed)
Pt w/ sxs of L AC joint arthritis.  Tylenol prn.  Refer to ortho for complete evaluation and tx.  Pt expressed understanding and is in agreement w/ plan.

## 2011-02-03 NOTE — Patient Instructions (Signed)
Take the Azithromycin for the bronchitis Add the Flonase daily- 2 sprays each nostril daily- to decrease congestion and post nasal drip Add Mucinex to thin your congestion Drink plenty of fluids Take Tylenol as needed for shoulder pain Someone will call you with your Sports Med appt for the shoulder

## 2011-02-10 ENCOUNTER — Encounter (INDEPENDENT_AMBULATORY_CARE_PROVIDER_SITE_OTHER): Payer: Medicare Other | Admitting: Licensed Clinical Social Worker

## 2011-02-10 DIAGNOSIS — F411 Generalized anxiety disorder: Secondary | ICD-10-CM

## 2011-02-24 ENCOUNTER — Encounter (HOSPITAL_COMMUNITY): Payer: Medicare Other | Admitting: Licensed Clinical Social Worker

## 2011-02-25 ENCOUNTER — Encounter (HOSPITAL_COMMUNITY): Payer: Self-pay | Admitting: Licensed Clinical Social Worker

## 2011-02-25 ENCOUNTER — Ambulatory Visit (INDEPENDENT_AMBULATORY_CARE_PROVIDER_SITE_OTHER): Payer: Medicare Other | Admitting: Licensed Clinical Social Worker

## 2011-02-25 DIAGNOSIS — F411 Generalized anxiety disorder: Secondary | ICD-10-CM
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
## 2011-03-07 ENCOUNTER — Encounter (HOSPITAL_COMMUNITY): Payer: Self-pay | Admitting: Licensed Clinical Social Worker

## 2011-03-07 ENCOUNTER — Ambulatory Visit (INDEPENDENT_AMBULATORY_CARE_PROVIDER_SITE_OTHER): Payer: Medicare Other | Admitting: Licensed Clinical Social Worker

## 2011-03-07 DIAGNOSIS — F411 Generalized anxiety disorder: Secondary | ICD-10-CM

## 2011-03-07 NOTE — Progress Notes (Signed)
   THERAPIST PROGRESS NOTE  Session Time: 8:30am-9:20am  Participation Level: Active  Behavioral Response: Well GroomedAlertAnxious  Type of Therapy: Individual Therapy  Treatment Goals addressed: Anxiety, Communication: with fer family. Setting healthy boundiares and expressing herelf assertively.  and Coping  Interventions: CBT, Strength-based, Supportive and Reframing  Summary: [name] is a 77 y.o. female who presents with euthymic mood and anxious affect. She is dressed up today and reports that she feels well physically and is pleased that she has been able to return to exercising. She is focused on her upcoming visit to [place] for the holidays and she expresses concern over her daughter [name]'s unrealistic expectations regarding spending time in [place] and the cost of going out to eat. Patient's anxiety grows as she focuses on how she can manage the expected conflict between her daughters and money. She needs redirection and reframing, focusing on her inability to "fix" this problem. Used CBT to help patient identify her codependent pattern of playing interference between her daughters in order to avoid upsetting her younger daughter. Patient is concerned that is she stands up to her youngest daughter, she will threaten to hurt herself. Explored how patient is being manipulated by her daughters who has bi-polar disorder and she suspects is not on medication. Encourage her to verbalize her boundaries while in session and helped her refocus on her locus on control. Her sleep and appetite are wnl.  Suicidal/Homicidal: Nowithout intent/plan  Therapist Response: Patients anxiety is high during session today as she focuses on her concern for her youngest daughter. Assisted her with the expression of her frustration with her daughters grandiose behavior. Patient is fearful and resistant when challenged with setting firmer boundaries with her daughter. She deeply wants to help  her children manage their stress and needs redirection to see how these attempts increase her own anxiety. Used CBT to assist patient with the identification of her negative distortions and irrational thoughts. Encouraged patient to verbalize alternative and factual responses which challenge her distortions. Reviewed patients self care plan. Assessed her progress related to self care. Patient's self care is excellent. Recommend proper diet, regular exercise, socialization and recreation. Patient denies suicidal and homicidal ideation, intent or plan. Patient will follow up with a therapist recommended by her congregational nurse while this therapist is on leave. Assisted her with this transition. Reviewed her crisis plan which includes contacting Dr. Lolly Mustache, her therapist, congregational nurse, her children, calling 911 and going to the nearest emergency room.   Plan: Return again in 8 weeks.  Diagnosis: Axis I: Generalized Anxiety Disorder    Axis II: No diagnosis    Khalila Buechner, LCSW 03/07/2011

## 2011-03-14 ENCOUNTER — Other Ambulatory Visit (HOSPITAL_COMMUNITY): Payer: Self-pay | Admitting: Psychiatry

## 2011-03-21 ENCOUNTER — Other Ambulatory Visit (HOSPITAL_COMMUNITY): Payer: Self-pay | Admitting: Psychiatry

## 2011-03-21 DIAGNOSIS — F411 Generalized anxiety disorder: Secondary | ICD-10-CM

## 2011-03-24 ENCOUNTER — Encounter (HOSPITAL_COMMUNITY): Payer: Medicare Other | Admitting: Psychiatry

## 2011-03-26 ENCOUNTER — Other Ambulatory Visit: Payer: Self-pay

## 2011-03-26 ENCOUNTER — Emergency Department (HOSPITAL_COMMUNITY)
Admission: EM | Admit: 2011-03-26 | Discharge: 2011-03-26 | Disposition: A | Discharge status: Home / Self Care | Payer: Medicare Other | Attending: Emergency Medicine | Admitting: Emergency Medicine

## 2011-03-26 DIAGNOSIS — G20 Parkinson's disease: Secondary | ICD-10-CM | POA: Insufficient documentation

## 2011-03-26 DIAGNOSIS — G20A1 Parkinson's disease without dyskinesia, without mention of fluctuations: Secondary | ICD-10-CM | POA: Insufficient documentation

## 2011-03-26 DIAGNOSIS — Z79899 Other long term (current) drug therapy: Secondary | ICD-10-CM | POA: Insufficient documentation

## 2011-03-26 DIAGNOSIS — I4891 Unspecified atrial fibrillation: Secondary | ICD-10-CM

## 2011-03-26 LAB — DIFFERENTIAL
Basophils Relative: 0 % (ref 0–1)
Eosinophils Absolute: 0.1 10*3/uL (ref 0.0–0.7)
Monocytes Relative: 7 % (ref 3–12)
Neutrophils Relative %: 76 % (ref 43–77)

## 2011-03-26 LAB — BASIC METABOLIC PANEL
BUN: 12 mg/dL (ref 6–23)
Calcium: 9.5 mg/dL (ref 8.4–10.5)
GFR calc Af Amer: >90 mL/min (ref >90)
GFR calc non Af Amer: 78 mL/min — ABNORMAL LOW (ref >90)
Potassium: 4.3 mEq/L (ref 3.5–5.1)

## 2011-03-26 LAB — CBC
Hemoglobin: 12.9 g/dL (ref 12.0–15.0)
MCH: 30.8 pg (ref 26.0–34.0)
MCHC: 33.2 g/dL (ref 30.0–36.0)
Platelets: 225 10*3/uL (ref 150–400)

## 2011-03-26 LAB — CARDIAC PANEL(CRET KIN+CKTOT+MB+TROPI): Troponin I: <0.3 ng/mL (ref <0.30)

## 2011-03-26 NOTE — ED Provider Notes (Signed)
History     CSN: [id number] Arrival date & time: 03/26/2011  8:42 AM   First MD Initiated Contact with Patient 03/26/11 0845      Chief Complaint  Patient presents with  . Atrial Fibrillation    hx of pt woke with rapid heart rate    (Consider location/radiation/quality/duration/timing/severity/associated sxs/prior treatment) HPI..... patient has intermittent bouts of atrial fibrillation. She has been evaluated by a local cardiologist for same. She complains of onset of irregular heartbeat early this morning. Her rhythm is now changed back to normal sinus. No chest pain, shortness of breath, diaphoresis, nausea.  Nothing makes it better or worse. Symptoms lasted less than 30 minutes. Described as moderate  Past Medical History  Diagnosis Date  . Arthritis   . Hyperlipidemia   . 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 Chronic constipation   . Hemorrhoid   . Varicose veins   . Anxiety   . Depression   . GERD (gastroesophageal reflux disease)   . Atrial fibrillation   . Recurrent UTI   . Parkinson disease   . Cardiac arrhythmia due to congenital heart disease     Past Surgical History  Procedure Date  . Appendectomy   . Ileal resection 03/2007    Due to obstruction    Family History  Problem Relation Age of Onset  . Asthma Brother   . Alcohol abuse Brother   . Throat cancer Father   . Alcohol abuse Father   . Lupus Daughter   . Bipolar disorder Daughter   . Lupus Daughter   . Anxiety disorder Daughter   . Alcohol abuse Sister   . Alcohol abuse Maternal Grandfather   . Alcohol abuse Paternal Grandmother     History  Substance Use Topics  . Smoking status: Never Smoker   . Smokeless tobacco: Not on file  . Alcohol Use: 1.2 oz/week    2 Glasses of wine per week     per month    OB History    Grav Para Term Preterm Abortions TAB SAB Ect Mult Living                  Review of Systems  All other systems reviewed and are negative.    Allergies    Amoxicillin and Ciprofloxacin  Home Medications   Current Outpatient Rx  Name Route Sig Dispense Refill  . ALBUTEROL SULFATE HFA 108 (90 BASE) MCG/ACT IN AERS Inhalation Inhale 2 puffs into the lungs 4 (four) times daily as needed.      . ALPRAZOLAM 0.5 MG PO TABS  Take prior to MRI or as needed for anxiety 10 tablet 0  . ASPIRIN 81 MG PO TABS Oral Take 81 mg by mouth daily.      . BECLOMETHASONE DIPROPIONATE 40 MCG/ACT IN AERS Inhalation Inhale 2 puffs into the lungs 2 (two) times daily. 1 Inhaler 12  . CALCIUM PO Oral Take by mouth daily.      Marland Kitchen CARBIDOPA-LEVODOPA 25-100 MG PO TABS Oral Take 1 tablet by mouth 2 (two) times daily.      Marland Kitchen CITALOPRAM HYDROBROMIDE 20 MG PO TABS  TAKE ONE TABLET BY MOUTH ONE TIME DAILY 30 tablet 1  . CRANBERRY 1000 MG PO CAPS Oral Take 1,000 mg by mouth daily.      . OMEGA-3 FATTY ACIDS 1000 MG PO CAPS Oral Take 1 g by mouth daily.      Marland Kitchen FLUDROCORTISONE ACETATE 0.1 MG PO TABS Oral Take 0.1 mg  by mouth as directed. One half tab daily     . FLUTICASONE PROPIONATE 50 MCG/ACT NA SUSP Nasal Place 2 sprays into the nose daily. 16 g 12  . LOPERAMIDE HCL 2 MG PO TABS Oral Take 2 mg by mouth as needed.      Marland Kitchen ONE-DAILY MULTI VITAMINS PO TABS Oral Take 1 tablet by mouth daily.      Marland Kitchen TRIMETHOPRIM 100 MG PO TABS Oral Take 100 mg by mouth at bedtime.        BP 98/71  Temp(Src) 97.9 F (36.6 C) (Oral)  Resp 20  SpO2 100%  Physical Exam  Nursing note and vitals reviewed. Constitutional: She is oriented to person, place, and time. She appears well-developed and well-nourished.  HENT:  Head: Normocephalic and atraumatic.  Eyes: Conjunctivae and EOM are normal. Pupils are equal, round, and reactive to light.  Neck: Normal range of motion. Neck supple.  Cardiovascular: Normal rate and regular rhythm.   Pulmonary/Chest: Effort normal and breath sounds normal.  Abdominal: Soft. Bowel sounds are normal.  Musculoskeletal: Normal range of motion.  Neurological: She  is alert and oriented to person, place, and time.  Skin: Skin is warm and dry.  Psychiatric: She has a normal mood and affect.    ED Course  Procedures (including critical care time)  Labs Reviewed - No data to display No results found.   No diagnosis found.  Date: 03/26/2011  Rate:74  Rhythm: normal sinus rhythm  QRS Axis: normal  Intervals: normal  ST/T Wave abnormalities: normal  Conduction Disutrbances:none  Narrative Interpretation:   Old EKG Reviewed: none available    MDM  Patient is now in normal sinus rhythm. She is alert and oriented.  We'll discharge home to followup with her cardiologist        Donnetta Hutching, MD 03/26/11 1103

## 2011-03-26 NOTE — ED Notes (Signed)
Pt woke with sob, nausea and rapid heart rate. Hx of afib. Nausea, sob and heart rate decreased on arrival.

## 2011-03-29 ENCOUNTER — Ambulatory Visit (INDEPENDENT_AMBULATORY_CARE_PROVIDER_SITE_OTHER): Payer: Medicare Other

## 2011-03-29 DIAGNOSIS — H1130 Conjunctival hemorrhage, unspecified eye: Secondary | ICD-10-CM

## 2011-03-31 ENCOUNTER — Encounter (HOSPITAL_COMMUNITY): Payer: Self-pay | Admitting: Psychiatry

## 2011-03-31 ENCOUNTER — Ambulatory Visit (INDEPENDENT_AMBULATORY_CARE_PROVIDER_SITE_OTHER): Payer: Medicare Other | Admitting: Psychiatry

## 2011-03-31 VITALS — Wt 158.6 lb

## 2011-03-31 DIAGNOSIS — F419 Anxiety disorder, unspecified: Secondary | ICD-10-CM

## 2011-03-31 DIAGNOSIS — F411 Generalized anxiety disorder: Secondary | ICD-10-CM

## 2011-03-31 MED ORDER — CITALOPRAM HYDROBROMIDE 20 MG PO TABS: 20.0000 mg | ORAL_TABLET | Freq: Every day | ORAL | Status: DC

## 2011-03-31 NOTE — Progress Notes (Signed)
Patient came for her followup appointment. She is recently visited emergency department due to shortness of breath she was diagnosed with atrial fibrillation and now she is taking digoxin daily. Patient also has subconjunctival hemorrhage and has right redeye. Patient is scheduled to see a cardiologist and eye doctor. Patient continued to endorse anxiety and nervousness and very concerned about her physical condition. She admitted being restless and sometimes cannot sleep very well. She does not want to take any more medication and very resistant to increase the dose. She told in the past she was prescribed DOS and but she stopped it however now she realized she may need to take regularly. She is going [place] to visit her family in Christmas. She reported no side effects of medication. She denies any agitation anger or mood swings. She denies any crying spells however continued to endorse nervousness and anxious. Her speech is fast and rapid but clear and coherent. Her mood is anxious and her affect is constricted. She is seeing therapist every every week and realize she may need to find a good support group so that she can take less and less medication in the future. She denies any auditory or visual hallucination. She denies any active or passive suicidal thoughts. There no psychotic symptoms present. She's alert and oriented x3. Her attention and concentration is fair. Her insight judgment is fair her impulse control is okay.  Assessment Anxiety disorder NOS  Plan I will continue Celexa 20 mg daily. I recommended to see therapist regularly and continue to take her medication for her physical condition. I reinforce to keep appointment with her cardiologist and eye doctor. I explained risks and benefits of medication and risk of relapse with poor compliance of her medication. I recommended to see therapist and talk about community support group which are available in this area. She is very reluctant to  come back in 2 months but agreed to see me in 3 months. She has one more refill on her Celexa however I have given a new prescription with one more additional refill.

## 2011-04-07 ENCOUNTER — Encounter (HOSPITAL_COMMUNITY): Payer: Medicare Other | Admitting: Psychiatry

## 2011-04-23 DIAGNOSIS — N3020 Other chronic cystitis without hematuria: Secondary | ICD-10-CM | POA: Diagnosis not present

## 2011-04-23 DIAGNOSIS — R300 Dysuria: Secondary | ICD-10-CM | POA: Diagnosis not present

## 2011-04-23 DIAGNOSIS — R3915 Urgency of urination: Secondary | ICD-10-CM | POA: Diagnosis not present

## 2011-04-28 ENCOUNTER — Encounter (HOSPITAL_COMMUNITY): Payer: Medicare Other | Admitting: Licensed Clinical Social Worker

## 2011-05-05 ENCOUNTER — Ambulatory Visit (INDEPENDENT_AMBULATORY_CARE_PROVIDER_SITE_OTHER): Payer: Medicare Other | Admitting: Licensed Clinical Social Worker

## 2011-05-05 ENCOUNTER — Encounter (HOSPITAL_COMMUNITY): Payer: Self-pay | Admitting: Licensed Clinical Social Worker

## 2011-05-05 DIAGNOSIS — F411 Generalized anxiety disorder: Secondary | ICD-10-CM

## 2011-05-05 NOTE — Progress Notes (Signed)
   THERAPIST PROGRESS NOTE  Session Time: 10:30am-11:20am  Participation Level: Active  Behavioral Response: Well GroomedAlertAnxious  Type of Therapy: Individual Therapy  Treatment Goals addressed: Anxiety  Interventions: CBT, Strength-based, Supportive and Reframing  Summary: [name] is a 78 y.o. female who presents with anxious mood and affect. Her speech is pressured, which is normal for her. She reports an increase in her anxiety and some depression over the past two months since this therapist has been on leave. She is frustrated with her physical health, has had more difficulty with atria fibrillation, UTI, general fatigue and natural congnitive decline, particularly, difficulty with word finding. She has a good time visiting her daughters over the holidays and discusses feeling very loved while there, but now she feels disconnected from that love. She wants more support and encouragement in her life, and she is sad that her friends are not providing that for her. She is fatigued easily and is angry about this. She wants to "fight" against her body's need for rest and questions whether or not she can succeed by pushing herself. Her sleep and appetite are wnl.    Suicidal/Homicidal: Nowithout intent/plan  Therapist Response: Patients anxiety is high today and she presents with some depression. Her negative self talk is strong and she needs frequent redirection. She remains focused on her friends and family not meeting her needs for acceptance and her self criticism is strong. She demonstrates cognitive distortions and is able to redirect her thoughts after multiple times at redirection and feedback from this therapist. Discussed the importance of self acceptance and ways in which to practice this. Used CBT to assist patient with the identification of her negative distortions and irrational thoughts. Encouraged patient to verbalize alternative and factual responses which challenge  her distortions. Assisted patient with the expression of her feelings of anxiety, depression and frustration. Reviewed patients self care plan. Assessed her progress related to self care. Patient's self care is good. Recommend proper diet, regular exercise, socialization and recreation. Patient denies suicidal and homicidal ideation, intent or plan.  Plan: Return again in 1 weeks.  Diagnosis: Axis I: Generalized Anxiety Disorder    Axis II: Deferred    Bernd Crom, LCSW 05/05/2011

## 2011-05-12 ENCOUNTER — Encounter (HOSPITAL_COMMUNITY): Payer: Medicare Other | Admitting: Licensed Clinical Social Worker

## 2011-05-14 ENCOUNTER — Ambulatory Visit (INDEPENDENT_AMBULATORY_CARE_PROVIDER_SITE_OTHER): Payer: Medicare Other | Admitting: Pulmonary Disease

## 2011-05-14 ENCOUNTER — Encounter: Payer: Self-pay | Admitting: Pulmonary Disease

## 2011-05-14 ENCOUNTER — Ambulatory Visit (INDEPENDENT_AMBULATORY_CARE_PROVIDER_SITE_OTHER)
Admission: RE | Admit: 2011-05-14 | Discharge: 2011-05-14 | Disposition: A | Discharge status: Home / Self Care | Payer: Medicare Other | Source: Ambulatory Visit | Attending: Pulmonary Disease | Admitting: Pulmonary Disease

## 2011-05-14 VITALS — BP 110/72 | HR 54 | Temp 98.1°F | Ht 71.0 in | Wt 162.0 lb

## 2011-05-14 DIAGNOSIS — R05 Cough: Secondary | ICD-10-CM | POA: Diagnosis not present

## 2011-05-14 DIAGNOSIS — R0602 Shortness of breath: Secondary | ICD-10-CM

## 2011-05-14 DIAGNOSIS — R3915 Urgency of urination: Secondary | ICD-10-CM | POA: Diagnosis not present

## 2011-05-14 DIAGNOSIS — R0609 Other forms of dyspnea: Secondary | ICD-10-CM | POA: Diagnosis not present

## 2011-05-14 DIAGNOSIS — J438 Other emphysema: Secondary | ICD-10-CM | POA: Diagnosis not present

## 2011-05-14 DIAGNOSIS — J449 Chronic obstructive pulmonary disease, unspecified: Secondary | ICD-10-CM

## 2011-05-14 DIAGNOSIS — R310 Gross hematuria: Secondary | ICD-10-CM | POA: Diagnosis not present

## 2011-05-14 DIAGNOSIS — R0989 Other specified symptoms and signs involving the circulatory and respiratory systems: Secondary | ICD-10-CM

## 2011-05-14 DIAGNOSIS — Q638 Other specified congenital malformations of kidney: Secondary | ICD-10-CM | POA: Diagnosis not present

## 2011-05-14 DIAGNOSIS — N3941 Urge incontinence: Secondary | ICD-10-CM | POA: Diagnosis not present

## 2011-05-14 NOTE — Patient Instructions (Signed)
Will try arcapta one inhalation each am for next 3 weeks.  Please call to give me feedback at that time. If you do not see a big difference in breathing, will schedule for cardiopulmonary exercise testing to evaluate the source of your shortness of breath Will check cxr today and call you with results.

## 2011-05-14 NOTE — Assessment & Plan Note (Signed)
The patient comes in today with worsening dyspnea on exertion compared to 2010.  It appears this is significantly interfering with her quality of life.  Since the last visit, she has been diagnosed with atrial fibrillation with rapid ventricular response, but her active last month was fairly normal.  The patient has a history of very mild air flow obstruction but I suspect this senile emphysema, and she has been tried on a LABA in the past.  She cannot remember whether this helped her or not.  Her followup spirometry today shows no significant change from 2010, and therefore I think it is unlikely a pulmonary issue is causing her current symptomatology.  I will check a chest x-ray today for completeness, and will also try her again on a LABA.  If she does not see any improvement, and wishes to work this up further, I would recommend a cardiopulmonary exercise test.

## 2011-05-14 NOTE — Progress Notes (Signed)
  Subjective:    Patient ID: [name], female    DOB: 03-Dec-1927, 78 y.o.   MRN: [id number]  HPI The patient comes in today for an acute sick visit related to worsening dyspnea on exertion.  She was last seen in 2010, where her spirometry showed very mild airflow obstruction, probably related to senile emphysema.  She was started on Foradil as a 4 week trial, and was supposed to call with feedback.  She never did, and cannot remember currently whether this helped.  She was also tried on Qvar by her primary doctor, and cannot remember if it helped or not.  She has now been diagnosed with actual fibrillation, with rate control being an issue at times.  She has had a recent echo that showed normal LV function, and no evidence for pulmonary hypertension.  She had a chest x-ray in August of last year that showed no acute process.  The patient comes in today where she is seen progressive dyspnea on exertion.  It has gotten to the point that she will get winded vacuuming one room of her home, or even bringing groceries in from the car.  She does not have shortness of breath walking through her house or at rest.  Her dyspnea has interfered with her exercise regimen.  She has no significant cough, and reports only a mild cough in the mornings with scant mucous production.   Review of Systems  Constitutional: Negative for fever and unexpected weight change.  HENT: Positive for congestion, sore throat, rhinorrhea and postnasal drip. Negative for ear pain, nosebleeds, sneezing, trouble swallowing, dental problem and sinus pressure.   Eyes: Negative for redness and itching.  Respiratory: Positive for cough, chest tightness, shortness of breath and wheezing.   Cardiovascular: Positive for leg swelling. Negative for palpitations.  Gastrointestinal: Negative for nausea and vomiting.  Genitourinary: Negative for dysuria.  Musculoskeletal: Positive for joint swelling.  Skin: Negative for rash.    Neurological: Positive for headaches.  Hematological: Does not bruise/bleed easily.  Psychiatric/Behavioral: Negative for dysphoric mood. The patient is not nervous/anxious.        Objective:   Physical Exam Well-developed female in no acute distress Nose without purulence or discharge noted Oropharynx clear Chest totally clear to auscultation Cardiac exam with regular rate and rhythm, 2/6 systolic murmur Lower extremities with no edema, no cyanosis Alert and oriented, moves all 4 extremities.       Assessment & Plan:

## 2011-05-19 ENCOUNTER — Encounter (HOSPITAL_COMMUNITY): Payer: Self-pay | Admitting: Licensed Clinical Social Worker

## 2011-05-19 ENCOUNTER — Ambulatory Visit (INDEPENDENT_AMBULATORY_CARE_PROVIDER_SITE_OTHER): Payer: Medicare Other | Admitting: Licensed Clinical Social Worker

## 2011-05-19 DIAGNOSIS — F411 Generalized anxiety disorder: Secondary | ICD-10-CM | POA: Diagnosis not present

## 2011-05-19 NOTE — Progress Notes (Signed)
   THERAPIST PROGRESS NOTE  Session Time: 9:30am-10:20am  Participation Level: Active  Behavioral Response: Well GroomedAlertAnxious  Type of Therapy: Individual Therapy  Treatment Goals addressed: Anxiety  Interventions: CBT, Strength-based and Supportive  Summary: [name] is a 78 y.o. female who presents with anxious mood and affect. She reports continued anxiety and expresses frustration with her thinking. She is concerned that she "worries" all the time about her heath, her choices and what others think about her. She discusses her hiking club and women's group which she has organized and how she is upset that members of the group are inviting others and the size is growing. She is uncomfortable with larger groups and engages in strong negative self talk and assumptions which prevent her from speaking up. She is resistant to change and is aware of this. She is overwhelmed by medical problems and tests. Her sleep and appetite are wnl.   Suicidal/Homicidal: Nowithout intent/plan  Therapist Response: Patient is highly anxious today with pressured speech and flight of ideas. She needs redirection throughout the session. Her negative self talk is strong and she questions her feelings, her judgment and her decisions. Used CBT to assist patient with the identification of her negative distortions and irrational thoughts. Encouraged patient to verbalize alternative and factual responses which challenge her distortions. Reviewed CBT strategies to reframe her thinking. Provided feedback, reassurance and reframing. Reviewed patients pattern of conflict avoidance and how this impacts her anxiety. Used motivational interviewing to assist and encourage patient through the change process. Explored patients barriers to change. Reviewed patients self care plan. Assessed her progress related to self care. Patient's self care is good. Recommend proper diet, regular exercise, socialization and  recreation.   Plan: Return again in one weeks.  Diagnosis: Axis I: Generalized Anxiety Disorder    Axis II: Deferred    Rayshell Goecke, LCSW 05/19/2011

## 2011-05-26 ENCOUNTER — Ambulatory Visit (HOSPITAL_COMMUNITY): Payer: Medicare Other | Admitting: Licensed Clinical Social Worker

## 2011-05-30 ENCOUNTER — Telehealth: Payer: Self-pay | Admitting: Pulmonary Disease

## 2011-05-30 NOTE — Telephone Encounter (Signed)
[email or web address]  

## 2011-05-30 NOTE — Telephone Encounter (Signed)
Pt returned call. [name]  

## 2011-05-30 NOTE — Telephone Encounter (Signed)
I spoke with pt and she states she has felt like the arcapta has helped with her breathing. She states some days more than others. She can tell a difference since using the arcapta. Although she states she did price this with her insurance and stated it would cost $185. Pt is aware KC is out of the office until wed. She stated she had 6 days left of the arcapta and was fine with a call back when he returned. Please advise Dr. Shelle Iron, thanks

## 2011-06-02 ENCOUNTER — Encounter (HOSPITAL_COMMUNITY): Payer: Self-pay | Admitting: Licensed Clinical Social Worker

## 2011-06-02 ENCOUNTER — Ambulatory Visit (INDEPENDENT_AMBULATORY_CARE_PROVIDER_SITE_OTHER): Payer: Medicare Other | Admitting: Licensed Clinical Social Worker

## 2011-06-02 ENCOUNTER — Telehealth: Payer: Self-pay | Admitting: Pulmonary Disease

## 2011-06-02 DIAGNOSIS — F411 Generalized anxiety disorder: Secondary | ICD-10-CM | POA: Diagnosis not present

## 2011-06-02 NOTE — Telephone Encounter (Signed)
I spoke with pt and advised her of KC recs. She states she will check with her insurance and see if it is cheaper and will call us back. Will await pt call back

## 2011-06-02 NOTE — Telephone Encounter (Signed)
Pt returned call. Kathleen W Perdue  

## 2011-06-02 NOTE — Telephone Encounter (Signed)
She can check and see if foradil is cheaper, but would have to take twice a day (am and pm) rather than once a day.

## 2011-06-02 NOTE — Telephone Encounter (Signed)
Duplicate msg.

## 2011-06-02 NOTE — Telephone Encounter (Signed)
LMTCB x 1 

## 2011-06-02 NOTE — Progress Notes (Signed)
   THERAPIST PROGRESS NOTE  Session Time: 2:00pm-2:50pm  Participation Level: Active  Behavioral Response: Well GroomedAlertAnxious  Type of Therapy: Individual Therapy  Treatment Goals addressed: Anxiety  Interventions: CBT, Strength-based and Supportive  Summary: [name] is a 78 y.o. female who presents with anxious mood and affect. She reports that today is a good day, which she measures by how tired she is and how badly she makes herself feel. She admits that she has some "terrrible" days, where she can't stand herself. She asks a lot of questions today, seeking clarification and guidance about when she may need home health care and if she is making the right decision by staying in [place] and not moving where her children are. She is frustrated by her health problems, particularly urinary problems and is upset she has to have a urodynamics study done. She remains very active in the community and her sleep and appetite are wnl.    Suicidal/Homicidal: Nowithout intent/plan  Therapist Response: Patient demonstrates a lot of self doubt today, but with guidance and reframing, she is able to verbalize increased confidence. Her negative self talk is present, but her insight into acceptance of self is slowly improving. She is limited in her feedback outside of therapy because she does not feel comfortable sharing this information with others. Used CBT to assist patient with the identification of her negative distortions and irrational thoughts. Encouraged patient to verbalize alternative and factual responses which challenge her distortions. Reviewed patients self care plan. Assessed her progress related to self care. Patient's self care is good. Recommend proper diet, regular exercise, socialization and recreation. Will continue to focus on CBT work, anxiety and and irrational thought processes.   Plan: Return again in one weeks.  Diagnosis: Axis I: Generalized Anxiety Disorder    Axis  II: No diagnosis    Abeeha Twist, LCSW 06/02/2011

## 2011-06-02 NOTE — Telephone Encounter (Signed)
Spoke with pt. She states that she checked with her insurance and the foradil would actually cost her more than the arcapta. She states that she is going to see Dr. Nadara Eaton on 06/06/11 and plans to discuss with him how much of her symptoms are coming from cardiac issues, and if she even needs inhalers. In the meantime has enough arcapta to last her until the end of the wk and will continue on this. She would like it if [facility] would call and speak with Dr. Nadara Eaton prior to her appt with him. I advised KC out of the office until 06/04/11 and she verbalized understanding. Please advise, thanks!

## 2011-06-04 NOTE — Telephone Encounter (Signed)
If she can clearly see a difference since being on arcapta, it probably is helping her.  One way to find out is to stop arcapta and see if breathing worsens.   Let me know what she wants to do once she sees cardiology.

## 2011-06-04 NOTE — Telephone Encounter (Signed)
ATC PT NA unable to leave VM WCB 

## 2011-06-04 NOTE — Telephone Encounter (Signed)
I spoke with pt and is aware of KC recs. She states she goes and see cardiologists on Friday and will call back once she see's him. Pt had no further questions

## 2011-06-06 DIAGNOSIS — R079 Chest pain, unspecified: Secondary | ICD-10-CM | POA: Diagnosis not present

## 2011-06-06 DIAGNOSIS — K219 Gastro-esophageal reflux disease without esophagitis: Secondary | ICD-10-CM | POA: Diagnosis not present

## 2011-06-06 DIAGNOSIS — R0602 Shortness of breath: Secondary | ICD-10-CM | POA: Diagnosis not present

## 2011-06-06 DIAGNOSIS — I4891 Unspecified atrial fibrillation: Secondary | ICD-10-CM | POA: Diagnosis not present

## 2011-06-09 ENCOUNTER — Ambulatory Visit (INDEPENDENT_AMBULATORY_CARE_PROVIDER_SITE_OTHER): Payer: Medicare Other | Admitting: Licensed Clinical Social Worker

## 2011-06-09 DIAGNOSIS — F411 Generalized anxiety disorder: Secondary | ICD-10-CM

## 2011-06-09 NOTE — Progress Notes (Signed)
   THERAPIST PROGRESS NOTE  Session Time: 3:00pm-3:50pm  Participation Level: Active  Behavioral Response: Well GroomedAlertAnxious  Type of Therapy: Individual Therapy  Treatment Goals addressed: Anxiety and Coping  Interventions: CBT, Strength-based and Supportive  Summary: [name] is a 78 y.o. female who presents with anxious mood and affect. Her anxiety is high today. She is upset over recent interactions with friends and questions why she continues to ask her friends to do things with her, when they turn her down and she is hurt. She describes feeling "stupid" and talks negatively about herself. She questions coming to therapy and taking medication. She called the senior housing living in [place] and was told there is an opening. She is unsure what to do and talks about her fear that she is beginning to forget things, such as if she turned off the stove. She is fearful that she may be unable to drive at some point in time. She is doubtful about her ability to make clear decisions. Her sleep and appetite are wnl.    Suicidal/Homicidal: Nowithout intent/plan  Therapist Response: Processed w/pt her feelings of anxiety and frustration. Her negative self talk and distorted thinking is strong today. She needs frequent and repetitive redirection. She needs reassurance that her fears are normal. Explored her fears, validated and normalized them. Assisted her with insight development into patterned behavioral patterns around distorted thinking. Discussed strategies to help patient make decisions, which include asking others for help and feedback. Used CBT to assist patient with the identification of her negative distortions and irrational thoughts. Encouraged patient to verbalize alternative and factual responses which challenge her distortions. Used motivational interviewing to assist and encourage patient through the change process. Explored patients barriers to change. Reviewed  patients self care plan. Assessed her progress related to self care. Patient's self care is good. Recommend proper diet, regular exercise, socialization and recreation. Recommend patient keep a journal. Reviewed CBT strategies/thought redirection to use in the upcoming week.  Plan: Return again in one weeks.  Diagnosis: Axis I: Generalized Anxiety Disorder    Axis II: No diagnosis    Lira Stephen, LCSW 06/09/2011

## 2011-06-16 ENCOUNTER — Ambulatory Visit (INDEPENDENT_AMBULATORY_CARE_PROVIDER_SITE_OTHER): Payer: Self-pay | Admitting: Licensed Clinical Social Worker

## 2011-06-16 DIAGNOSIS — F411 Generalized anxiety disorder: Secondary | ICD-10-CM | POA: Diagnosis not present

## 2011-06-16 NOTE — Progress Notes (Signed)
   THERAPIST PROGRESS NOTE  Session Time: 3:00pm-3:50pm  Participation Level: Active  Behavioral Response: Well GroomedAlertAnxious  Type of Therapy: Individual Therapy  Treatment Goals addressed: Anxiety and Coping  Interventions: CBT, Solution Focused and Strength-based  Summary: [name] is a 78 y.o. female who presents with euthymic mood and anxious affect. She reports ongoing baseline anxiety and expresses her frustration that her struggle with anxiety is an ongoing one. She is pleased that her and her friends had a very enjoyable time dining at [facility]. She questions why others don't interact and plan events with her as she does with them. Her thinking is rigid and she frequently asks why. She is fearful about tests at the urologist tomorrow and believes that since she said she was afraid out loud, that she will now experience increased anxiety.  She is having some trouble breathing and struggles to understand why she needs to use an inhaler. She believes that exercise can "fix" it and she needs frequent redirection about this issue. Her sleep and appetite are wnl.   Suicidal/Homicidal: Nowithout intent/plan  Therapist Response: Processed w/pt her anxiety, distorted thinking and provided reframing with facts. Used CBT to assist patient with the identification of her negative distortions and irrational thoughts. Encouraged patient to verbalize alternative and factual responses which challenge her distortions. Patients thinking is rigid today. She has difficulty understanding and accepting that others are different from her. Discussed her unrealistic expectations of others which lead to disappointment. Reviewed strategies to manage her anxiety and codependency.  Reviewed patients self care plan. Assessed her progress related to self care. Patient's self care is good. Recommend proper diet, regular exercise, socialization and recreation.   Plan: Return again in one  weeks.  Diagnosis: Axis I: Generalized Anxiety Disorder    Axis II: No diagnosis    Kashana Breach, LCSW 06/16/2011

## 2011-06-17 DIAGNOSIS — N3941 Urge incontinence: Secondary | ICD-10-CM | POA: Diagnosis not present

## 2011-06-23 ENCOUNTER — Ambulatory Visit (HOSPITAL_COMMUNITY): Payer: Self-pay | Admitting: Licensed Clinical Social Worker

## 2011-06-23 ENCOUNTER — Ambulatory Visit (INDEPENDENT_AMBULATORY_CARE_PROVIDER_SITE_OTHER): Payer: Medicare Other | Admitting: Licensed Clinical Social Worker

## 2011-06-23 DIAGNOSIS — F411 Generalized anxiety disorder: Secondary | ICD-10-CM | POA: Diagnosis not present

## 2011-06-23 NOTE — Progress Notes (Signed)
   THERAPIST PROGRESS NOTE  Session Time: 3:00pm-3:50pm  Participation Level: Active  Behavioral Response: Well GroomedAlertAnxious  Type of Therapy: Individual Therapy  Treatment Goals addressed: Anxiety and Coping  Interventions: CBT and Supportive  Summary: [name] is a 78 y.o. female who presents with anxious mood and affect. She reports feeling well, with baseline anxiety and frustration. She has a urinary track infection as a result of urodynamics test. She is eager to learn the results tomorrow. She is tired more often and struggles to accept that this is a normal process of aging, especially given her high activity level. She is bothered by assisted living facilities contacting her and talking into visiting them. Her daughter tells her she cannot afford to live in a place like that. She is concerned about her ambivalence and struggles to accept that this is a natural part of the decision making process. She shows this therapist her calendar, which is busy with activity each day. She does not want to slow down, despite her fatigue. Her sleep and appetite are wnl.   Suicidal/Homicidal: Nowithout intent/plan  Therapist Response: Processed w/pt her anxiety and cognitive distortions. Wrote down affirming statements on sticky notes for patient to display in her home, because she forgot why she needed the notes. Used CBT to assist patient with the identification of her negative distortions and irrational thoughts. Encouraged patient to verbalize alternative and factual responses which challenge her distortions. Used DBT to help patient build distress tolerance to her anxiety. Provided feedback to patient about her projection of her unrealistic expectations of herself onto others. Reviewed patients self care plan. Assessed her progress related to self care. Patient's self care is good. Recommend proper diet, regular exercise, socialization and recreation.   Plan: Return again in two  weeks.  Diagnosis: Axis I: Generalized Anxiety Disorder    Axis II: No diagnosis    Mekala Winger, LCSW 06/23/2011

## 2011-06-24 DIAGNOSIS — N318 Other neuromuscular dysfunction of bladder: Secondary | ICD-10-CM | POA: Diagnosis not present

## 2011-06-24 DIAGNOSIS — N3941 Urge incontinence: Secondary | ICD-10-CM | POA: Diagnosis not present

## 2011-06-24 DIAGNOSIS — R3915 Urgency of urination: Secondary | ICD-10-CM | POA: Diagnosis not present

## 2011-06-30 ENCOUNTER — Ambulatory Visit (INDEPENDENT_AMBULATORY_CARE_PROVIDER_SITE_OTHER): Payer: Medicare Other | Admitting: Psychiatry

## 2011-06-30 ENCOUNTER — Encounter (HOSPITAL_COMMUNITY): Payer: Self-pay | Admitting: Psychiatry

## 2011-06-30 DIAGNOSIS — F411 Generalized anxiety disorder: Secondary | ICD-10-CM

## 2011-06-30 NOTE — Progress Notes (Signed)
Chief complaint I want to try different medication  History of present illness Patient is 78 year old Caucasian female who came for her followup appointment. She has recently seen nurse at her church who recommended that she need medication adjustment. Patient admitted increased anxiety and nervousness however she has been always very reluctant to increase medication or tried different antidepressant. Recently she feels she should try increase Celexa. Patient continues to have nervousness and worries about her future. She denies any agitation anger or mood swings. She denies any crying spells however continued to endorse nervousness and anxious. She has seen cardiologist recently and taking her cardiac medication without any problem. She is seeing therapist regularly.  Mental status examination Patient is fairly groomed and appears to be in her stated age. She is thin built and appears very anxious. Her speech is fast and rapid but clear and coherent. Her mood is anxious and her affect is constricted. She denies any auditory or visual hallucination. She denies any active or passive suicidal thoughts. There no psychotic symptoms present. She's alert and oriented x3. Her attention and concentration is fair. Her insight judgment is fair her impulse control is okay.  Assessment Axis I Anxiety disorder NOS  Plan I will increase Celexa to 30 mg to target anxiety symptoms. She is agreed to try higher doses. I have explained risks and benefits of medication in detail. I will see her again in 3 weeks. Patient is also very open to try different medication if increased Celexa does not help. She will continue to see therapist on a regular basis. She will use remaining refill of her Celexa. Followup in 2 weeks. I recommended to call us if she has any question or concern or if she feels worsening of her symptoms.

## 2011-07-07 ENCOUNTER — Ambulatory Visit (HOSPITAL_COMMUNITY): Payer: Self-pay | Admitting: Licensed Clinical Social Worker

## 2011-07-10 ENCOUNTER — Encounter (HOSPITAL_COMMUNITY): Payer: Self-pay | Admitting: Licensed Clinical Social Worker

## 2011-07-10 ENCOUNTER — Ambulatory Visit (HOSPITAL_COMMUNITY): Payer: Self-pay | Admitting: Licensed Clinical Social Worker

## 2011-07-10 ENCOUNTER — Ambulatory Visit (INDEPENDENT_AMBULATORY_CARE_PROVIDER_SITE_OTHER): Payer: Medicare Other | Admitting: Licensed Clinical Social Worker

## 2011-07-10 ENCOUNTER — Other Ambulatory Visit (HOSPITAL_COMMUNITY): Payer: Self-pay | Admitting: Psychiatry

## 2011-07-10 DIAGNOSIS — F411 Generalized anxiety disorder: Secondary | ICD-10-CM

## 2011-07-10 DIAGNOSIS — F419 Anxiety disorder, unspecified: Secondary | ICD-10-CM | POA: Insufficient documentation

## 2011-07-10 NOTE — Progress Notes (Signed)
   THERAPIST PROGRESS NOTE  Session Time: 11:30am-12:20pm  Participation Level: Active  Behavioral Response: Well GroomedAlertAnxious  Type of Therapy: Individual Therapy  Treatment Goals addressed: Anxiety  Interventions: CBT, Solution Focused and Supportive  Summary: [name] is a 78 y.o. female who presents with anxious mood and affect. She reports being busy, with many activities which provide her joy and she is excited to share with friends. She describes walking with her hiking club yesterday, followed by listening to a music concert. While she is happy to stay active, she questions her decisions. She brought her calendar to session and shows this therapist how packed her days are. She questions if she "should" stay this busy and is afraid of feeling badly if she doesn't. She endorses the occasional intrusive thought in the morning of "What's the point of living". She denies wanting to act upon this thought, has no plans or intent, but is naturally bothered by the thought itself. She is uncertain what causes this thought. While looking at her calendar in session, she realized that she has a conflict during her next scheduled appointment. She becomes highly anxious and fearful that she is going to make the wrong decision and that she is doing something "wrong" when she must adjust her schedule. Her overall physical health is well, without any UTI. Her sleep and appetite are both wnl.   Suicidal/Homicidal: Nowithout intent/plan  Therapist Response: Processed w/patietn her feelings of anxiety. She demonstrates strong self doubt, rigid thinking and distorting thinking. Used CBT to assist patient with the identification of her negative distortions and irrational thoughts. Encouraged patient to verbalize alternative and factual responses which challenge her distortions. She requires continual redirection throughout the session and demonstrates some confusion, contributing to her  anxiety. Discussed her intrusive thoughts, which present themselves typically when she is physically ill or tired and unable to engage in social or recreational activity. Assessed her risk to self, which is low and explored ways in which she can counter these thoughts with positive facts. Assisted her with anxiety reduction about her schedule by providing reframing, normalizing her experience and challenging with fact. Reviewed patients self care plan. Assessed her progress related to self care. Patient's self care is good. Recommend proper diet, regular exercise, socialization and recreation.   Plan: Return again in one weeks.  Diagnosis: Axis I: Generalized Anxiety Disorder    Axis II: No diagnosis    Natori Gudino, LCSW 07/10/2011

## 2011-07-11 ENCOUNTER — Other Ambulatory Visit (HOSPITAL_COMMUNITY): Payer: Self-pay | Admitting: Psychiatry

## 2011-07-11 ENCOUNTER — Other Ambulatory Visit (HOSPITAL_COMMUNITY): Payer: Self-pay | Admitting: Physician Assistant

## 2011-07-11 ENCOUNTER — Telehealth (HOSPITAL_COMMUNITY): Payer: Self-pay | Admitting: Licensed Clinical Social Worker

## 2011-07-11 MED ORDER — CITALOPRAM HYDROBROMIDE 20 MG PO TABS: 30.0000 mg | ORAL_TABLET | Freq: Every day | ORAL | Status: DC

## 2011-07-11 NOTE — Telephone Encounter (Signed)
Called patient and left message, letting her know that Jorje Guild, PA called in her medication in a higher dose.

## 2011-07-15 ENCOUNTER — Ambulatory Visit (HOSPITAL_COMMUNITY): Payer: Self-pay | Admitting: Licensed Clinical Social Worker

## 2011-07-16 DIAGNOSIS — H35379 Puckering of macula, unspecified eye: Secondary | ICD-10-CM | POA: Diagnosis not present

## 2011-07-23 ENCOUNTER — Ambulatory Visit (HOSPITAL_COMMUNITY): Payer: Self-pay | Admitting: Psychiatry

## 2011-07-28 ENCOUNTER — Ambulatory Visit (INDEPENDENT_AMBULATORY_CARE_PROVIDER_SITE_OTHER): Payer: Medicare Other | Admitting: Licensed Clinical Social Worker

## 2011-07-28 DIAGNOSIS — N3941 Urge incontinence: Secondary | ICD-10-CM | POA: Diagnosis not present

## 2011-07-28 DIAGNOSIS — F411 Generalized anxiety disorder: Secondary | ICD-10-CM

## 2011-07-28 DIAGNOSIS — R3915 Urgency of urination: Secondary | ICD-10-CM | POA: Diagnosis not present

## 2011-07-28 DIAGNOSIS — N3020 Other chronic cystitis without hematuria: Secondary | ICD-10-CM | POA: Diagnosis not present

## 2011-07-28 DIAGNOSIS — N318 Other neuromuscular dysfunction of bladder: Secondary | ICD-10-CM | POA: Diagnosis not present

## 2011-07-28 NOTE — Progress Notes (Signed)
   THERAPIST PROGRESS NOTE  Session Time: 3:00pm-3:50pm  Participation Level: Active  Behavioral Response: Well GroomedAlertAnxious  Type of Therapy: Individual Therapy  Treatment Goals addressed: Anxiety and Coping  Interventions: CBT, Strength-based, Supportive and Reframing  Summary: [name] is a 78 y.o. female who presents with anxious mood and affect. She is worried about her daughter who lives in [place] because she has lost her job and is unable to pay her rent. She asks what she can do to fix this problem. She is displeased that her one daughter is helping the other and does not want [name] to take advantage of [name].  She is unhappy that she has not been exercising and since she is unable to attend three times per week, she has decided not to attend at all. She is unhappy about the changes in her body as she ages and the adjustments she must make. Her sleep and appetite are wnl.  Suicidal/Homicidal: Nowithout intent/plan  Therapist Response: reviewed patients progress and assessed her current functioning. Her anxiety is high in session and she needs frequent redirection and reframing. She is confused today and repeats herself often. Used CBT to assist patient with the identification of her negative distortions and irrational thoughts. Encouraged patient to verbalize alternative and factual responses which challenge her distortions. Used problem solving to help patient plan ways to return to exercising. Challenged her distorted thinking. Encouraged her verbalize her positive improvements. Reviewed patients self care plan. Assessed her progress related to self care. Patient's self care is good. Recommend proper diet, regular exercise, socialization and recreation.   Plan: Return again in one weeks.  Diagnosis: Axis I: Generalized Anxiety Disorder    Axis II: No diagnosis    [name], LCSW 07/28/2011

## 2011-07-30 DIAGNOSIS — M79609 Pain in unspecified limb: Secondary | ICD-10-CM | POA: Diagnosis not present

## 2011-07-30 DIAGNOSIS — B351 Tinea unguium: Secondary | ICD-10-CM | POA: Diagnosis not present

## 2011-08-04 ENCOUNTER — Ambulatory Visit (INDEPENDENT_AMBULATORY_CARE_PROVIDER_SITE_OTHER): Payer: Medicare Other | Admitting: Licensed Clinical Social Worker

## 2011-08-04 DIAGNOSIS — F411 Generalized anxiety disorder: Secondary | ICD-10-CM | POA: Diagnosis not present

## 2011-08-04 DIAGNOSIS — F321 Major depressive disorder, single episode, moderate: Secondary | ICD-10-CM | POA: Diagnosis not present

## 2011-08-04 NOTE — Progress Notes (Signed)
   THERAPIST PROGRESS NOTE  Session Time: 3:00pm-3:50pm  Participation Level: Active  Behavioral Response: Well GroomedAlertAnxious and Depressed  Type of Therapy: Individual Therapy  Treatment Goals addressed: Coping  Interventions: CBT, Solution Focused and Strength-based  Summary: [name] is a 78 y.o. female who presents with depressed mood and anxious affect. She reports feeling terrible, with poor focus and exhaustion and believes this is related to the increase in Celexa. She has been feeling this way for the past two days and struggles to accept that it is probably related to a medication she has been given to calm cramping. She feels "zoned out" and has trouble focusing in session today. She does not come with a list as she normally does. She feels depressed, with poor motivation and frustration with herself. She is not sure if she should continue taking any of her medication and wants to believe that she can will herself to be well.   Suicidal/Homicidal: Nowithout intent/plan  Therapist Response: Reviewed patients progress and assessed her current functioning. She is depressed and confused today. She needs frequent redirection, particularly around her need to continue therapy and remain on her medication. Her thinking is negative and distorted. Used CBT to assist patient with the identification of her negative distortions and irrational thoughts. Encouraged patient to verbalize alternative and factual responses which challenge her distortions. Provided feedback to patient about her pattern of increased depression when she is physically ill. Recommend patient talk to Dr. Lolly Mustache about her medication, which she plans on doing this week. Reviewed patients self care plan. Assessed her progress related to self care. Patient's self care is good. Recommend proper diet, regular exercise, socialization and recreation.   Plan: Return again in one weeks.  Diagnosis: Axis I:  Generalized Anxiety Disorder and Major Depression, single episode    Axis II: No diagnosis    Naidelin Gugliotta, LCSW 08/04/2011

## 2011-08-08 ENCOUNTER — Ambulatory Visit (INDEPENDENT_AMBULATORY_CARE_PROVIDER_SITE_OTHER): Payer: Medicare Other | Admitting: Psychiatry

## 2011-08-08 ENCOUNTER — Encounter (HOSPITAL_COMMUNITY): Payer: Self-pay | Admitting: Psychiatry

## 2011-08-08 DIAGNOSIS — F411 Generalized anxiety disorder: Secondary | ICD-10-CM

## 2011-08-08 DIAGNOSIS — F419 Anxiety disorder, unspecified: Secondary | ICD-10-CM

## 2011-08-08 MED ORDER — SERTRALINE HCL 25 MG PO TABS: ORAL_TABLET | ORAL | Status: DC

## 2011-08-08 NOTE — Progress Notes (Signed)
Chief complaint I cannot take 30 mg of Celexa and I want to try different medication  History of present illness Patient is 78 year old Caucasian female who came for her followup appointment.  She reported that she cannot take citalopram 30 mg.  She feels very dizzy and feel zone out and cut down to 20 mg.  She lost her try a different medication for anxiety.  She remains very anxious and nervous.  She continues to have psychosocial stressor related to her daughter and family problems .  She does not want very strong medication.  She's very concerned about her physical health like to try a different medication which does not cause and interaction with her physical illness .  She sleeping okay but endorse racing thoughts and nervousness.  She denies any agitation anger or mood swings.  She's compliant with other medication .  Current psychiatric medication Citalopram 20 mg .  Medical history Patient has history of arthritis, hyperlipidemia, hemorrhoid, very), chronic constipation, GERD, recurrent UTI, Parkinson disease, chronic arrhythmia and edema of colon..  Mental status examination Patient is fairly groomed and appears to be in her stated age. She is thin built and appears very anxious. Her speech is fast and rapid but clear and coherent. Her mood is anxious and her affect is constricted. She denies any auditory or visual hallucination.  There were no tremors or shakes present.  There were no paranoia or delusion present.  She denies any active or passive suicidal thoughts. There no psychotic symptoms present. She's alert and oriented x3. Her attention and concentration is fair. Her insight judgment is fair her impulse control is okay.  Assessment Axis I Anxiety disorder NOS Axis II deferred Axis III see medical history Axis IV moderate Axis V 60-65  Plan I will discontinue Celexa after decreasing to 10 mg for one week.  I will start Zoloft but she has not taken in the past.  We will start  with a small dose 25 mg and gradually titrated to 50 mg in one week.  I explain in detail about the risks and benefits of medication.  I also talk about short-term side effects including sedation tremors headache but I also recommended to call us if she have any concerns or side effects of medication.  Current and restriction of the cross titration of medication is given.  She will see therapist regularly.  I will see her again in 3 weeks.

## 2011-08-08 NOTE — Patient Instructions (Signed)
You will reduce celexa to 10 mg daily for one week and than stopped. Take Sertraline 25 mg one tab daily for one week and than 2 tab daily. Please call us if you have any question or issue related to medication.

## 2011-08-11 ENCOUNTER — Ambulatory Visit (INDEPENDENT_AMBULATORY_CARE_PROVIDER_SITE_OTHER): Payer: Medicare Other | Admitting: Licensed Clinical Social Worker

## 2011-08-11 DIAGNOSIS — F411 Generalized anxiety disorder: Secondary | ICD-10-CM | POA: Diagnosis not present

## 2011-08-11 DIAGNOSIS — F321 Major depressive disorder, single episode, moderate: Secondary | ICD-10-CM

## 2011-08-12 ENCOUNTER — Encounter (HOSPITAL_COMMUNITY): Payer: Self-pay | Admitting: Licensed Clinical Social Worker

## 2011-08-12 NOTE — Progress Notes (Signed)
   THERAPIST PROGRESS NOTE  Session Time: 3:00pm-3:50pm  Participation Level: Active  Behavioral Response: Well GroomedAlertAnxious  Type of Therapy: Individual Therapy  Treatment Goals addressed: Coping  Interventions: CBT, Motivational Interviewing, Strength-based, Supportive and Reframing  Summary: [name] is a 78 y.o. female who presents with anxious mood and affect. She has cut down on Celexa and reports improvement in her anxiety. She remains upset and concerned about her daugther in [place] and how she is asking to borrow money of her other daughter. She is afraid that she is going to ask her for money and she will feel pressured to give it to her despite not having it. She is upset with herself for spending time with people who make her feel badly because they don't invite her to social outings, as she invites them. She wants to know if she will ever "get over this" feeling of insecurity. She questions her behavior and her choices in friends. She is fearful of being left out and abandoned. Her sleep has increased and she is taking naps during the day, which she is uncomfortable with. Her appetite is wnl.    Suicidal/Homicidal: Nowithout intent/plan  Therapist Response: Reviewed patients progress and assessed her current functioning. Her anxiety and distorted thinking have reduced since her last visit, but she continues to question her own behavior. Her self doubt is strong and prevents her from acting. Used CBT to assist patient with the identification of her negative distortions and irrational thoughts. Encouraged patient to verbalize alternative and factual responses which challenge her distortions. Explored the consequences of waiting for answers of assurance before acting. Explored patients need for certainty of a positive outcome and used motivation interviewing to help her develop insight into change. Used motivational interviewing to assist and encourage patient through the  change process. Explored patients barriers to change. Reviewed patients self care plan. Assessed her progress related to self care. Patient's self care is good. Recommend proper diet, regular exercise, socialization and recreation.   Plan: Return again in one weeks.  Diagnosis: Axis I: Generalized Anxiety Disorder    Axis II: No diagnosis    Catherine Dettmann, LCSW 08/12/2011

## 2011-08-15 DIAGNOSIS — R82998 Other abnormal findings in urine: Secondary | ICD-10-CM | POA: Diagnosis not present

## 2011-08-15 DIAGNOSIS — R310 Gross hematuria: Secondary | ICD-10-CM | POA: Diagnosis not present

## 2011-08-15 DIAGNOSIS — R3915 Urgency of urination: Secondary | ICD-10-CM | POA: Diagnosis not present

## 2011-08-15 DIAGNOSIS — R300 Dysuria: Secondary | ICD-10-CM | POA: Diagnosis not present

## 2011-08-25 ENCOUNTER — Encounter (HOSPITAL_COMMUNITY): Payer: Self-pay | Admitting: Licensed Clinical Social Worker

## 2011-08-25 ENCOUNTER — Ambulatory Visit (INDEPENDENT_AMBULATORY_CARE_PROVIDER_SITE_OTHER): Payer: Medicare Other | Admitting: Licensed Clinical Social Worker

## 2011-08-25 DIAGNOSIS — F411 Generalized anxiety disorder: Secondary | ICD-10-CM | POA: Diagnosis not present

## 2011-08-25 NOTE — Progress Notes (Signed)
   THERAPIST PROGRESS NOTE  Session Time: 3:00pm-3:50pm  Participation Level: Active  Behavioral Response: Well GroomedAlertEuthymic  Type of Therapy: Individual Therapy  Treatment Goals addressed: Anxiety and Coping  Interventions: CBT, Supportive and Reframing  Summary: [name] is a 78 y.o. female who presents with euthymic mood and bright affect. She reports feeling excellent with improved mood, decreased anxiety and improved focus. She is pleased with the change in her medication and feels so well on 25 mg, that she did not increase to 50 mg as recommended by Dr.Arfeen. Recommend patient talk to Dr. Lolly Mustache about this at her appointment. She is happy because she has returned to the gym and is more active at the senior center. She does not report any physical health problems today and recognizes that this has a significant impact on her overall perspective of her life. She is worried about her daughter and hurt that she told patient that she is cutting her out of her life because she has set boundaries with her regarding money. Patient reports that her brother died and she was unable to travel and attend the funeral. She is upset with his children in the way they have handled his death and poor communication with patient. Her sleep and appetite are wnl.   Suicidal/Homicidal: Nowithout intent/plan  Therapist Response: Processed w/pt her feelings about her brothers death. Normalized her grief reaction and encouraged her to explore her feelings deeper. She is happy that her brother did not suffer and died of a sudden heart attack. Reviewed the importance of attending regular therapy sessions to manage her anxiety and depression. Patient demonstrates negative self talk and self doubt regarding her friends and being left out. Used CBT to assist patient with the identification of her negative distortions and irrational thoughts. Encouraged patient to verbalize alternative and factual  responses which challenge her distortions. Used motivational interviewing to assist and encourage patient through the change process. Explored patients barriers to change. Used motivational interviewing to assist and encourage patient through the change process. Explored patients barriers to change.   Plan: Return again in one weeks.  Diagnosis: Axis I: Generalized Anxiety Disorder    Axis II: No diagnosis    Sabriah Hobbins, LCSW 08/25/2011

## 2011-08-29 ENCOUNTER — Ambulatory Visit (HOSPITAL_COMMUNITY): Payer: Self-pay | Admitting: Psychiatry

## 2011-08-29 DIAGNOSIS — N3020 Other chronic cystitis without hematuria: Secondary | ICD-10-CM | POA: Diagnosis not present

## 2011-08-29 DIAGNOSIS — R300 Dysuria: Secondary | ICD-10-CM | POA: Diagnosis not present

## 2011-09-01 ENCOUNTER — Encounter (HOSPITAL_COMMUNITY): Payer: Self-pay | Admitting: Licensed Clinical Social Worker

## 2011-09-01 ENCOUNTER — Ambulatory Visit (INDEPENDENT_AMBULATORY_CARE_PROVIDER_SITE_OTHER): Payer: Medicare Other | Admitting: Licensed Clinical Social Worker

## 2011-09-01 DIAGNOSIS — F411 Generalized anxiety disorder: Secondary | ICD-10-CM | POA: Diagnosis not present

## 2011-09-01 DIAGNOSIS — N3020 Other chronic cystitis without hematuria: Secondary | ICD-10-CM | POA: Diagnosis not present

## 2011-09-01 NOTE — Progress Notes (Signed)
   THERAPIST PROGRESS NOTE  Session Time: 9:30am-10:20am  Participation Level: Active  Behavioral Response: Well GroomedAlertAnxious  Type of Therapy: Individual Therapy  Treatment Goals addressed: Anxiety and Coping  Interventions: CBT, Solution Focused and Reframing  Summary: [name] is a 78 y.o. female who presents with anxious mood and affect. She reports improvement in her feelings of depression and anxiety over the past week. She has been thinking about her brother's death, misses him and is disappointed that she was unable to attend his funeral. She is sadenned by the fact that his family has not reached out to her as she would like, but is moving on from this. She is in a good mood today because her health is good and she plans to exercise and attend a class at the [facility]. She is focused on what her life purpose is and if she is unable to help others then she can find no other purpose to her life. She questions the way she has lived her life and continues to focus on "what is wrong with me". She plans to visit her daughter in June and feels badly because she plans to keep this a secret from her other daughter who lives in [place] because she is negatively affected by her behavior. She feels guilty about this and questions if she is damaging her daughter by setting this limit. Her sleep and appetite are wnl.    Suicidal/Homicidal: Nowithout intent/plan  Therapist Response: Reviewed patients progress and assessed her current functioning. She demonstrates negative self talk, distorted thoughts, strong self doubt and negative assumptions about others, herself and the way in which other perceive her. Used CBT to assist patient with the identification of her negative distortions and irrational thoughts. Encouraged patient to verbalize alternative and factual responses which challenge her distortions. Explored her low self esteem and her inability to accept herself for who she  is. Processed the negative consequences associated with this behavior. Reviewed healthy boundaries and provided feedback regarding patients need to set healthy limits with her daughter. Used DBT to help patient build distress tolerance skills. Reviewed patients self care plan. Assessed her progress related to self care. Patient's self care is good. Recommend proper diet, regular exercise, socialization and recreation. Processed w/pt her grief reaction to her brothers death and normalized her current feelings.   Plan: Return again in one weeks.  Diagnosis: Axis I: Generalized Anxiety Disorder    Axis II: No diagnosis    Emanuel Campos, LCSW 09/01/2011

## 2011-09-08 ENCOUNTER — Ambulatory Visit (INDEPENDENT_AMBULATORY_CARE_PROVIDER_SITE_OTHER): Payer: Medicare Other | Admitting: Licensed Clinical Social Worker

## 2011-09-08 ENCOUNTER — Encounter (HOSPITAL_COMMUNITY): Payer: Self-pay | Admitting: Licensed Clinical Social Worker

## 2011-09-08 DIAGNOSIS — F411 Generalized anxiety disorder: Secondary | ICD-10-CM

## 2011-09-08 NOTE — Progress Notes (Signed)
   THERAPIST PROGRESS NOTE  Session Time: 9:30am-10:20am  Participation Level: Active  Behavioral Response: Well GroomedAlertAnxious  Type of Therapy: Individual Therapy  Treatment Goals addressed: Anxiety and Coping  Interventions: CBT, Strength-based and Reframing  Summary: [name] is a 78 y.o. female who presents with anxious mood and affect. She reports improvement in her degree of anxiety and is trying to focus on being thankful and creating an awareness of things she is thankful for. She is frustrated that after recent treatment for a bladder infection, she had blood in her urine yesterday. She did not have any today and plans to watch it and call her doctor if it gets worse. She is disgusted that she continues to have health problems and asks herself what is wrong with her. She expects to be able to "just get over it" by "willing myself" well. She is resistant to contact her doctor today and focuses on blaming herself for having frequent bladder infections. She remains frustrated with her daughter, [name], and does not know how to help her. She is hurt by her daughters treatment towards her and has begun setting limits with her. She questions why her daughter behaves this way and looks towards her own parenting of her for blame and/or explanation. She remains active, but tires after a week of activity and does not understand why, despite her age. Her sleep and appetite are wnl.    Suicidal/Homicidal: Nowithout intent/plan  Therapist Response: Reviewed patients progress and assessed current functioning. She demonstrates irrational thought patterns surrounding her health and even with cognitive restructuring, she struggles to understand and reframe her thinking. Used CBT to assist patient with the identification of her negative distortions and irrational thoughts. Encouraged patient to verbalize alternative and factual responses which challenge her distortions. Discussed her  limits regarding her daughter, ways in which she can uphold firm boundaries and challenged her distorted thought processes. She does demonstrate progress related to insight development regarding her generalized anxiety, but is quick to resort to self doubt regarding her feelings. Normalized patients feelings and encouraged expression without judgment. Reviewed patients self care plan. Assessed her progress related to self care. Patient's self care is good. Recommend proper diet, regular exercise, socialization and recreation.   Plan: Return again in two weeks.  Diagnosis: Axis I: Generalized Anxiety Disorder    Axis II: No diagnosis    Alistair Senft, LCSW 09/08/2011

## 2011-09-09 DIAGNOSIS — N3020 Other chronic cystitis without hematuria: Secondary | ICD-10-CM | POA: Diagnosis not present

## 2011-09-09 DIAGNOSIS — R310 Gross hematuria: Secondary | ICD-10-CM | POA: Diagnosis not present

## 2011-09-12 ENCOUNTER — Ambulatory Visit (INDEPENDENT_AMBULATORY_CARE_PROVIDER_SITE_OTHER): Payer: Medicare Other | Admitting: Psychiatry

## 2011-09-12 ENCOUNTER — Encounter (HOSPITAL_COMMUNITY): Payer: Self-pay | Admitting: Psychiatry

## 2011-09-12 DIAGNOSIS — F419 Anxiety disorder, unspecified: Secondary | ICD-10-CM

## 2011-09-12 DIAGNOSIS — F411 Generalized anxiety disorder: Secondary | ICD-10-CM

## 2011-09-12 MED ORDER — SERTRALINE HCL 25 MG PO TABS: 25.0000 mg | ORAL_TABLET | Freq: Every day | ORAL | Status: DC

## 2011-09-12 NOTE — Progress Notes (Signed)
Chief complaint I like new medication.  I'm taking only 25 mg.    History of present illness Patient is 78 year old Caucasian female who came for her followup appointment.  On her last visit we started her on Zoloft 25 mg and recommended take 50 after one week however patient continues to be on 25 mg.  Patient does not feel he need higher dose.  He feels much better with only 25 mg needed she stopped taking Celexa.  She feels more energy and improve in her concentration.  She feel that therapy should be able to help her along with a small dose of antidepressant.  She denies any agitation anger mood swing.  She denies any side effects of medication.  She reported her sleep is fine.  She denies any recent panic attack.  She continues to talk about her daughter who live up [place] and having issues with her but she is talking counselor about these issues.  She denies any violent behavior.  Her sleep and appetite is unchanged.  Current psychiatric medication Zoloft 25 mg daily   Medical history Patient has history of arthritis, hyperlipidemia, hemorrhoid, very), chronic constipation, GERD, recurrent UTI, Parkinson disease, chronic arrhythmia and edema of colon..  Mental status examination Patient is fairly groomed and appears to be in her stated age. She is thin built and appears less anxious and more calmer.. Her speech is fast but clear and coherent. Her mood is anxious and her affect is constricted. She denies any auditory or visual hallucination.  There were no tremors or shakes present.  There were no paranoia or delusion present.  She denies any active or passive suicidal thoughts. There no psychotic symptoms present. She's alert and oriented x3. Her attention and concentration is fair. Her insight judgment is fair her impulse control is okay.  Assessment Axis I Anxiety disorder NOS Axis II deferred Axis III see medical history Axis IV moderate Axis V 60-65  Plan I I explain that Zoloft 15 mg a  very small dose and she should consider taking to 50 mg however patient liked to stay on the smaller dose.  I will continue 25 mg as per patient's request.  I recommend to call us if she has any question or concern about the medication.  I will see her again in 6 weeks.

## 2011-09-22 ENCOUNTER — Ambulatory Visit (INDEPENDENT_AMBULATORY_CARE_PROVIDER_SITE_OTHER): Payer: Medicare Other | Admitting: Licensed Clinical Social Worker

## 2011-09-22 DIAGNOSIS — F411 Generalized anxiety disorder: Secondary | ICD-10-CM | POA: Diagnosis not present

## 2011-09-22 NOTE — Progress Notes (Signed)
   THERAPIST PROGRESS NOTE  Session Time: 10:30am-11:20am  Participation Level: Active  Behavioral Response: Well GroomedAlertAnxious  Type of Therapy: Individual Therapy  Treatment Goals addressed: Anxiety and Coping  Interventions: CBT and Reframing  Summary: [name] is a 78 y.o. female who presents with anxious mood and affect. She is frustrated and discouraged because she has another urinary tract infection and is having trouble with the anti-biotic causing stomach upset. She is angry with her urologist, doesn't feel she is getting the kind of care she needs and endorses feeling hopeless about her chronic infections. She remains anxious and worried about her daughter in [place] and continues to be treated poorly by her. She has hung up the phone on her and is confused when her daughter calls her the next day and acts as though nothing has happened. She tries to tell her daughter about free things to take advantage of in [place], but her daughter is defensive. Patient does not understand why others don't want to live the way she does. She is blocked by her belief that her way of living is the best and only way to interact with the world. Her sleep and appetite are wnl.    Suicidal/Homicidal: Nowithout intent/plan  Therapist Response: Reviewed patients progress and assessed her current functioning. Processed her frustration with her urinary tract infection, recommend patient call her doctor back and report that she is unable to tolerate the medication and request a new medication. She is resistant to this idea and needs reframing and redirection. Her thinking remains strongly distorted related to how she expects others to live their lives. Used CBT to assist patient with the identification of her negative distortions and irrational thoughts. Encouraged patient to verbalize alternative and factual responses which challenge her distortions. Reviewed patients self care plan. Assessed her  progress related to self care. Patient's self care is good. Recommend proper diet, regular exercise, socialization and recreation.   Plan: Return again in two weeks.  Diagnosis: Axis I: Generalized Anxiety Disorder    Axis II: No diagnosis    Hilberto Burzynski, LCSW 09/22/2011

## 2011-10-06 ENCOUNTER — Ambulatory Visit (HOSPITAL_COMMUNITY): Payer: Self-pay | Admitting: Licensed Clinical Social Worker

## 2011-10-20 ENCOUNTER — Ambulatory Visit (INDEPENDENT_AMBULATORY_CARE_PROVIDER_SITE_OTHER): Payer: Medicare Other | Admitting: Licensed Clinical Social Worker

## 2011-10-20 ENCOUNTER — Encounter (HOSPITAL_COMMUNITY): Payer: Self-pay | Admitting: Licensed Clinical Social Worker

## 2011-10-20 DIAGNOSIS — F411 Generalized anxiety disorder: Secondary | ICD-10-CM

## 2011-10-20 NOTE — Progress Notes (Signed)
   THERAPIST PROGRESS NOTE  Session Time: 8:30am-9:20am  Participation Level: Active  Behavioral Response: Well GroomedAlertAnxious  Type of Therapy: Individual Therapy  Treatment Goals addressed: Anxiety  Interventions: CBT and Reframing  Summary: [name] is a 78 y.o. female who presents with anxious mood and affect. She has recently returned from a trip to visit her daughter, where she visited the retirement home and was told that she is third on the list. She endorses feeling highly anxious about this, uncertain if she should move and unclear about just how much help her daughter will be able to offer her once she moves there. She does not like that her daughter talks about patient getting rid of her things in a nonchalant manner, when it is much more complicated and emotionally draining. She questions her daughters intention for wanting patient to move closer to her and suspects that she it is an accomplishment for her daughter. She questions others intentions and is generally suspicious about others being interested in her well being, due to her low self esteem. Her sleep and appetite are wnl.    Suicidal/Homicidal: Nowithout intent/plan  Therapist Response: Processed w/pt her anxiety and confusion over how to make this decision. Assisted her with problem solving and explored her options. Patients thinking is distorted, irrational and negative at times. Her thinking is black and white, with negative assumptions about others, with strong projection. Used CBT to assist patient with the identification of her negative distortions and irrational thoughts. Encouraged patient to verbalize alternative and factual responses which challenge her distortions. Reviewed coping strategies to manage her anxiety. Recommend patient attend weekly sessions to assist with decision making process. Provided feedback about her co-dependent behavioral patterns of keeping busy to avoid her feelings.  Reviewed patients self care plan. Assessed her progress related to self care. Patient's self care is good. Recommend proper diet, regular exercise, socialization and recreation.   Plan: Return again in one to two weeks.  Diagnosis: Axis I: Generalized Anxiety Disorder    Axis II: No diagnosis    Lucila Klecka, LCSW 10/20/2011

## 2011-10-24 ENCOUNTER — Ambulatory Visit (INDEPENDENT_AMBULATORY_CARE_PROVIDER_SITE_OTHER): Payer: Medicare Other | Admitting: Psychiatry

## 2011-10-24 ENCOUNTER — Encounter (HOSPITAL_COMMUNITY): Payer: Self-pay | Admitting: Psychiatry

## 2011-10-24 DIAGNOSIS — F411 Generalized anxiety disorder: Secondary | ICD-10-CM

## 2011-10-24 DIAGNOSIS — F419 Anxiety disorder, unspecified: Secondary | ICD-10-CM

## 2011-10-24 MED ORDER — SERTRALINE HCL 50 MG PO TABS: 50.0000 mg | ORAL_TABLET | Freq: Every day | ORAL | Status: DC

## 2011-10-24 NOTE — Progress Notes (Signed)
Chief complaint Medication management and followup.    History of present illness Patient is 78 year old Caucasian female who came for her followup appointment.  She recently visited [place] to visit her daughter.  She had a mixed emotions on her visit.  She enjoyed talking to her daughter and visiting them but do not feel comfortable making a decision about her move to live close there.  Patient appears very anxious and tense today.  She admitted after that visit she is thinking Prozac and cons about her moving.  Patient told that she liked living in [place] .  She goes to exercise 3 times a week and very involved in seniors activity.  She has good friend and support from the church .  However up [place] she has 2 daughter who live in [place] and [place] but sometimes they do not have time to visit her.  She did not like the other up [place].  She was not happy with her daughter who did not clean 6 years ago when she had surgery.  However patient has not made a decision so far.  She admitted sometime taking Zoloft 25 mg twice a day for increased anxiety and actually felt better.  She denies any side effects of medication.  She sleeping fine.  She denies any crying spells.  She denies any agitation anger mood swing.  She has no side effects with medication.  She is seeing therapist regularly for coping and social skills.   In the past I have recommended her to increase Zoloft to 50 mg but patient declined.  Current psychiatric medication Zoloft 25 mg daily   Medical history Patient has history of arthritis, hyperlipidemia, hemorrhoid, very), chronic constipation, GERD, recurrent UTI, Parkinson disease, chronic arrhythmia and edema of colon..  Mental status examination Patient is fairly groomed and appears to be in her stated age. She is thin built and appears anxious but cooperative.  Her speech is fast but clear and coherent. Her mood is anxious and her affect is constricted. She denies any  auditory or visual hallucination.  There were no tremors or shakes present.  There were no paranoia or delusion present.  She denies any active or passive suicidal thoughts. There no psychotic symptoms present. She's alert and oriented x3. Her attention and concentration is fair. Her insight judgment is fair her impulse control is okay.  Assessment Axis I Anxiety disorder NOS Axis II deferred Axis III see medical history Axis IV moderate Axis V 60-65  Plan I explain that Zoloft 25 mg a very small dose and she should consider taking to 50 mg daily.  She had tried on some occasion 0.5 mg twice a day and do not have any side effects.  Patient agreed to try 50 mg daily .  She admitted being more anxious since she visited her daughters [place].  I recommend to call us if she has any question or concern about the medication.  I will see her again in 6 weeks.

## 2011-10-30 ENCOUNTER — Other Ambulatory Visit: Payer: Self-pay | Admitting: Family Medicine

## 2011-10-30 DIAGNOSIS — Z1231 Encounter for screening mammogram for malignant neoplasm of breast: Secondary | ICD-10-CM

## 2011-11-03 ENCOUNTER — Ambulatory Visit (INDEPENDENT_AMBULATORY_CARE_PROVIDER_SITE_OTHER): Payer: Medicare Other | Admitting: Licensed Clinical Social Worker

## 2011-11-03 ENCOUNTER — Encounter (HOSPITAL_COMMUNITY): Payer: Self-pay | Admitting: Licensed Clinical Social Worker

## 2011-11-03 DIAGNOSIS — F411 Generalized anxiety disorder: Secondary | ICD-10-CM

## 2011-11-03 NOTE — Progress Notes (Signed)
   THERAPIST PROGRESS NOTE  Session Time: 8:30am-9:20am  Participation Level: Active  Behavioral Response: Well GroomedAlertAnxious  Type of Therapy: Individual Therapy  Treatment Goals addressed: Anxiety and Coping  Interventions: CBT, Supportive and Reframing  Summary: [name] is a 78 y.o. female who presents with anxious mood and bright affect. She is pleased with the increase in Zoloft and notices that it helps her anxiety, when she finds herself acting differently than she normally does. She is primarily anxious about coming to a decision regarding moving. She explores her thoughts, fears and concern that her children will not have enough time to spend with her and that despite their insistence, she doubts their ability to be available to her. She is concerned about her declining memory and decreased energy and she refuses to accept that it is age related and/or Parkinson's. She does not accept that she has Parkinson's and is uncertain if a second opinion would help her accept the disease. She remains active with friends, but questions if she does this to avoid making a decision. Her sleep and appetite are both wnl.    Suicidal/Homicidal: Nowithout intent/plan  Therapist Response: Processed w/pt her anxiety related to moving. Explored her fears and frustration. Assisted her with increased insight into her anxiety and negative, distorted thoughts, based on assumptions. Used CBT to assist patient with the identification of her negative distortions and irrational thoughts. Encouraged patient to verbalize alternative and factual responses which challenge her distortions. Reviewed coping skills to target anxiety. Challenged patients refusal to accept her Parkinson's diagnosis. Recommend patient get a second opinion at [facility]. Patient has not made progress related to this decision. She suffers from increased self doubt and some confusion over what is best for her. Reviewed patients self  care plan. Assessed her progress related to self care. Patient's self care is good. Recommend proper diet, regular exercise, socialization and recreation.   Plan: Return again in one weeks.  Diagnosis: Axis I: Generalized Anxiety Disorder    Axis II: No diagnosis    Shamere Campas, LCSW 11/03/2011

## 2011-11-04 DIAGNOSIS — B351 Tinea unguium: Secondary | ICD-10-CM | POA: Diagnosis not present

## 2011-11-04 DIAGNOSIS — M79609 Pain in unspecified limb: Secondary | ICD-10-CM | POA: Diagnosis not present

## 2011-11-10 ENCOUNTER — Ambulatory Visit (INDEPENDENT_AMBULATORY_CARE_PROVIDER_SITE_OTHER): Payer: Medicare Other | Admitting: Licensed Clinical Social Worker

## 2011-11-10 DIAGNOSIS — F411 Generalized anxiety disorder: Secondary | ICD-10-CM

## 2011-11-10 NOTE — Progress Notes (Signed)
   THERAPIST PROGRESS NOTE  Session Time: 4:00pm-4:50pm  Participation Level: Active  Behavioral Response: Well GroomedAlertAnxious  Type of Therapy: Individual Therapy  Treatment Goals addressed: Anxiety and Coping  Interventions: CBT and Reframing  Summary: [name] is a 78 y.o. female who presents with anxious mood and affect. She is highly anxious as she talks about her uncertainty about moving back home. She endorses confusion and nightmares about this decision. She feels she needs to make a decision and that because her children want her to move back home, she should. She is full of self doubt and is now considering continuing care homes in [place]. She processes frustration with two of her friends who are treating her coldly and poorly. She remains active at the [facility] and her hiking club. She wants to accurately predict what her needs will be in the future and is frustrated over her inability to do so. Her sleep and appetite are wnl.    Suicidal/Homicidal: Nowithout intent/plan  Therapist Response: Processed w/pt her feelings of anxiety and frustration. Patient demonstrates high levels of self doubt and distorted thinking. Used CBT to assist patient with the identification of her negative distortions and irrational thoughts. Encouraged patient to verbalize alternative and factual responses which challenge her distortions. Assisted patient with increased insight into the negative consequences she suffers when she looks to others to determine what his best for her. Reviewed coping strategies. Reviewed patients self care plan. Assessed her progress related to self care. Patient's self care is good. Recommend proper diet, regular exercise, socialization and recreation.   Plan: Return again in one weeks.  Diagnosis: Axis I: Generalized Anxiety Disorder    Axis II: No diagnosis    Chalese Peach, LCSW 11/10/2011

## 2011-11-12 ENCOUNTER — Encounter: Payer: Self-pay | Admitting: Family Medicine

## 2011-11-13 ENCOUNTER — Encounter: Payer: Self-pay | Admitting: *Deleted

## 2011-11-13 ENCOUNTER — Encounter: Payer: Self-pay | Admitting: Family Medicine

## 2011-11-13 ENCOUNTER — Ambulatory Visit (INDEPENDENT_AMBULATORY_CARE_PROVIDER_SITE_OTHER): Payer: Medicare Other | Admitting: Family Medicine

## 2011-11-13 VITALS — BP 118/75 | HR 80 | Temp 97.8°F | Ht 71.0 in | Wt 157.6 lb

## 2011-11-13 DIAGNOSIS — N390 Urinary tract infection, site not specified: Secondary | ICD-10-CM

## 2011-11-13 DIAGNOSIS — R413 Other amnesia: Secondary | ICD-10-CM

## 2011-11-13 DIAGNOSIS — I4891 Unspecified atrial fibrillation: Secondary | ICD-10-CM | POA: Diagnosis not present

## 2011-11-13 DIAGNOSIS — Z0000 Encounter for general adult medical examination without abnormal findings: Secondary | ICD-10-CM | POA: Diagnosis not present

## 2011-11-13 LAB — CBC WITH DIFFERENTIAL/PLATELET
Basophils Absolute: 0 10*3/uL (ref 0.0–0.1)
Eosinophils Absolute: 0.2 10*3/uL (ref 0.0–0.7)
HCT: 37.8 % (ref 36.0–46.0)
Hemoglobin: 12.6 g/dL (ref 12.0–15.0)
Lymphocytes Relative: 27 % (ref 12.0–46.0)
Lymphs Abs: 1.6 10*3/uL (ref 0.7–4.0)
MCHC: 33.2 g/dL (ref 30.0–36.0)
MCV: 92.8 fl (ref 78.0–100.0)
Monocytes Absolute: 0.4 10*3/uL (ref 0.1–1.0)
Neutro Abs: 3.6 10*3/uL (ref 1.4–7.7)
RDW: 14.2 % (ref 11.5–14.6)

## 2011-11-13 LAB — BASIC METABOLIC PANEL
BUN: 11 mg/dL (ref 6–23)
Calcium: 9.4 mg/dL (ref 8.4–10.5)
Creatinine, Ser: 0.8 mg/dL (ref 0.4–1.2)

## 2011-11-13 LAB — HEPATIC FUNCTION PANEL
Bilirubin, Direct: 0 mg/dL (ref 0.0–0.3)
Total Bilirubin: 0.6 mg/dL (ref 0.3–1.2)

## 2011-11-13 LAB — TSH: TSH: 1.84 u[IU]/mL (ref 0.35–5.50)

## 2011-11-13 MED ORDER — TRIMETHOPRIM 100 MG PO TABS: 100.0000 mg | ORAL_TABLET | Freq: Every day | ORAL | Status: DC

## 2011-11-13 NOTE — Patient Instructions (Addendum)
Follow up in 6 months- sooner if needed We'll notify you of your lab results and make any changes if needed We'll call you with your neurology appt for the memory loss Restart the Trimethoprim nightly to prevent UTIs Call with any questions or concerns Enjoy the rest of your summer!

## 2011-11-13 NOTE — Progress Notes (Signed)
  Subjective:    Patient ID: [name], female    DOB: 1928-02-11, 78 y.o.   MRN: [id number]  HPI Here today for CPE.  Risk Factors: Atrial Fib- chronic problem, on Flecainide/Dig.  Seeing Dr Jacinto Halim.  He is monitoring Dig levels Recurrent UTI- pt currently on Keflex 250mg  daily.  Seeing Dr Vernie Ammons at [facility].  Previously on Trimethoprim 100mg  QHS as prophylactic med.  She took herself off this when she was doing well. Memory loss- this is concerning to pt, notes she is having increased difficulty lately.  Was seeing Dr Oswaldo Conroy.   Physical Activity: walking regularly, does 3x/week w/ [employer]neakers Fall Risk: increased risk due to Parkinson's Depression: ongoing problem, currently on meds and in therapy Hearing: decreased, wearing hearing aides ADL's: independent Cognitive: + memory loss, linear conversation Home Safety: lives alone, feels safe, wears Life Alert bracelet in case of falls Height, Weight, BMI, Visual Acuity: see vitals, vision corrected to 20/20 w/ glasses Counseling: UTD on colonoscopy, has mammo scheduled for next week, UTD on DEXA, UTD on GYN ([facility]) Labs Ordered: See A&P Care Plan: See A&P    Review of Systems Patient reports no hearing changes, adenopathy, fever, weight change,  persistant/recurrent hoarseness, swallowing issues, chest pain, palpitations, edema, persistant/recurrent cough, hemoptysis, gastrointestinal bleeding (melena, rectal bleeding), abdominal pain, significant heartburn, bowel changes, Gyn symptoms (abnormal  bleeding, pain),  syncope, focal weakness, numbness & tingling, skin/hair/nail changes, abnormal bruising or bleeding.   + SOB, blurry vision (UTD on eye exam w/ Dr Hazle Quant)    Objective:   Physical Exam General Appearance:    Alert, cooperative, no distress, appears stated age  Head:    Normocephalic, without obvious abnormality, atraumatic  Eyes:    PERRL, conjunctiva/corneas clear, EOM's intact, fundi    benign, both eyes    Ears:    Normal TM's and external ear canals, both ears  Nose:   Nares normal, septum midline, mucosa normal, no drainage    or sinus tenderness  Throat:   Lips, mucosa, and tongue normal; teeth and gums normal  Neck:   Supple, symmetrical, trachea midline, no adenopathy;    Thyroid: no enlargement/tenderness/nodules  Back:     Symmetric, no curvature, ROM normal, no CVA tenderness  Lungs:     Clear to auscultation bilaterally, respirations unlabored  Chest Wall:    No tenderness or deformity   Heart:    Irregular S1 and S2, normal rate, no murmur, rub   or gallop  Breast Exam:    Deferred to GYN  Abdomen:     Soft, non-tender, bowel sounds active all four quadrants,    no masses, no organomegaly  Genitalia:    Deferred to GYN  Rectal:    Extremities:   Extremities normal, atraumatic, no cyanosis or edema  Pulses:   2+ and symmetric all extremities  Skin:   Skin color, texture, turgor normal, no rashes or lesions  Lymph nodes:   Cervical, supraclavicular, and axillary nodes normal  Neurologic:   CNII-XII intact, normal strength, sensation.  Mild resting tremor and shuffling gait          Assessment & Plan:

## 2011-11-17 ENCOUNTER — Telehealth: Payer: Self-pay | Admitting: *Deleted

## 2011-11-17 ENCOUNTER — Encounter (HOSPITAL_COMMUNITY): Payer: Self-pay | Admitting: Licensed Clinical Social Worker

## 2011-11-17 ENCOUNTER — Ambulatory Visit (INDEPENDENT_AMBULATORY_CARE_PROVIDER_SITE_OTHER): Payer: Medicare Other | Admitting: Licensed Clinical Social Worker

## 2011-11-17 DIAGNOSIS — F411 Generalized anxiety disorder: Secondary | ICD-10-CM | POA: Diagnosis not present

## 2011-11-17 NOTE — Progress Notes (Signed)
   THERAPIST PROGRESS NOTE  Session Time: 8:30am-9:20am  Participation Level: Active  Behavioral Response: Well GroomedAlertAnxious and Euthymic  Type of Therapy: Individual Therapy  Treatment Goals addressed: Anxiety and Coping  Interventions: CBT, Strength-based, Supportive and Reframing  Summary: [name] is a 78 y.o. female who presents with euthymic mood and anxious affect. She is pleased to report that she met her new PCP and liked her, but is frustrated that she forgot her notes. She remains frustrated with her memory lapses and refuses to believe and accept that she has Parkinson's disease. She remains focused on her uncertainty about moving to [place] and moves between constant anxious thoughts and avoidance of thoughts. She processes how much she loves her life down here and how she does not want to give that up. She struggles to allow herself to remain in [place] and desires permission from her children to remain in [place]. She processes her anger and frustration with her daughter who lives in [place] and how she avoids talking to her. She endorses feelings of guilt and shame and is embarrassed to talk to others about her children. Her sleep and appetite are wnl.    Suicidal/Homicidal: Nowithout intent/plan  Therapist Response: Assessed patients current functioning and reviewed progress. Processed patients feelings of uncertainty about moving. Assisted her with increased insight into her anxiety about moving. Used CBT to assist patient with the identification of her negative distortions and irrational thoughts. Encouraged patient to verbalize alternative and factual responses which challenge her distortions. Discussed a plan of action involving communicating with her family her desire to remain in [place]. Patient is unwilling to discuss her denial of Parkinson's disease. Reviewed patients self care plan. Assessed her progress related to self care. Patient's self care is good. Recommend proper  diet, regular exercise, socialization and recreation. Used DBT to help patient use mindfulness and distress tolerance.   Plan: Return again in one weeks.  Diagnosis: Axis I: Generalized Anxiety Disorder    Axis II: No diagnosis    Martinez Boxx, LCSW 11/17/2011

## 2011-11-17 NOTE — Telephone Encounter (Signed)
Pt's cholesterol was not checked b/c her values were outstanding last year and medicare doesn't cover routine screening cholesterol labs yearly.  If she would like it done, she will have to pay out of pocket for this.

## 2011-11-17 NOTE — Telephone Encounter (Signed)
Pt wants to know why her cholesterol was not checked. Pt indicated that she fasted so she does not understand why it was not done. .Please advise

## 2011-11-18 ENCOUNTER — Ambulatory Visit: Payer: Self-pay

## 2011-11-18 NOTE — Telephone Encounter (Signed)
.  left message to have patient return my call.  

## 2011-11-19 NOTE — Telephone Encounter (Signed)
Spoke to pt to advise results/instructions. Pt understood. Pt asked why is a wellness visit does not include her cholesterol being checked, advised this is MD Tabori protocol to check the cholesterol yearly however PT INSURANCE will NOT cover it per she has medicare, advised pt to contact her insurance company about the concern to have it re-checked, pt then repeated same questions, reiterated MD Beverely Low is aware of the importance of checking cholesterol and she is happy to draw these labs however pt will have to pay out of pocket to do so, also note last years numbers were great. Pt understood and then asked about the elevated GFR noted range of 60 and her number is over 72, advised this is a creatnine function/liver functions, advised per MD Tabori these numbers do elevate when you age and is no need for concern at this time, offered pt apt to discuss lab results, pt declined and thanked me for my time

## 2011-11-24 ENCOUNTER — Ambulatory Visit (INDEPENDENT_AMBULATORY_CARE_PROVIDER_SITE_OTHER): Payer: Medicare Other | Admitting: Licensed Clinical Social Worker

## 2011-11-24 DIAGNOSIS — F411 Generalized anxiety disorder: Secondary | ICD-10-CM | POA: Diagnosis not present

## 2011-11-24 NOTE — Progress Notes (Signed)
   THERAPIST PROGRESS NOTE  Session Time: 8:30am-9:20am  Participation Level: Active  Behavioral Response: Well GroomedAlertAnxious  Type of Therapy: Individual Therapy  Treatment Goals addressed: Anxiety and Coping  Interventions: CBT, Strength-based and Reframing  Summary: [name] is a 78 y.o. female who presents with anxious mood and affect. She reports an increase in her anxiety related to her need to make a decision about moving to [place] or remaining in [place]. She is overwhelmed with self doubt, questions if she is being selfish in her desire to stay here and not taking into consideration what her children want. She is upset with some of her friends when they don't invite her to do things. She questions why she is "always" the one who extends invitations and wants to be included in others lives. She feels insecure in her own self and talks about "building walls" to protect herself from being hurt. She feels mis-understood. Her sleep and appetite are wnl. She has a meeting at [facility] this morning to explore extended care options.    Suicidal/Homicidal: Nowithout intent/plan  Therapist Response: Assessed patients current functioning and reviewed progress. Reviewed coping strategies.  Assisted patient with the expression of her feelings of frustration, anxiety and anger. Explored her need for acceptance and how she often misinterprets others' intentions.  Used CBT to assist patient with the identification of negative distortions and irrational thoughts. Encouraged patient to verbalize alternative and factual responses which challenge thought distortions. Used DBT to practice mindfulness, review distraction list and improve distress tolerance skills. Reviewed patients self care plan. Assessed  progress related to self care. Patient's self care is excellent. Recommend proper diet, regular exercise, socialization and recreation.   Plan: Return again in one  weeks.  Diagnosis: Axis I: Generalized Anxiety Disorder    Axis II: No diagnosis    Nikiah Goin, LCSW 11/24/2011

## 2011-11-25 DIAGNOSIS — Z7189 Other specified counseling: Secondary | ICD-10-CM | POA: Insufficient documentation

## 2011-11-25 NOTE — Assessment & Plan Note (Signed)
Chronic problem.  Following w/ Cards.  Remains irregularly irregular but rate controlled.  Asymptomatic at this time.

## 2011-11-25 NOTE — Assessment & Plan Note (Signed)
Deteriorated.  Pt reports sxs are more bothersome to her.  Pt continues to drive.  Based on this, will refer back to Neuro for complete evaluation.  Pt expressed understanding and is in agreement w/ plan.

## 2011-11-25 NOTE — Assessment & Plan Note (Signed)
Pt's PE WNL w/ exception of Afib and notable memory loss.  UTD on colonoscopy and GYN.  Check labs.  Anticipatory guidance provided.

## 2011-11-25 NOTE — Assessment & Plan Note (Signed)
Pt following w/ urology.  Will restart her on TMP as she cannot recall why she took herself off it.  Pt to f/u w/ uro prn.

## 2011-11-27 ENCOUNTER — Ambulatory Visit
Admission: RE | Admit: 2011-11-27 | Discharge: 2011-11-27 | Disposition: A | Discharge status: Home / Self Care | Payer: Medicare Other | Source: Ambulatory Visit | Attending: Family Medicine | Admitting: Family Medicine

## 2011-11-27 DIAGNOSIS — Z1231 Encounter for screening mammogram for malignant neoplasm of breast: Secondary | ICD-10-CM

## 2011-12-01 ENCOUNTER — Ambulatory Visit (INDEPENDENT_AMBULATORY_CARE_PROVIDER_SITE_OTHER): Payer: Medicare Other | Admitting: Licensed Clinical Social Worker

## 2011-12-01 ENCOUNTER — Encounter (HOSPITAL_COMMUNITY): Payer: Self-pay | Admitting: Licensed Clinical Social Worker

## 2011-12-01 DIAGNOSIS — F411 Generalized anxiety disorder: Secondary | ICD-10-CM | POA: Diagnosis not present

## 2011-12-01 NOTE — Progress Notes (Signed)
   THERAPIST PROGRESS NOTE  Session Time: 8:30am-9:20am  Participation Level: Active  Behavioral Response: Well GroomedAlertAnxious  Type of Therapy: Individual Therapy  Treatment Goals addressed: Anxiety and Coping  Interventions: CBT, Supportive and Reframing  Summary: [name] is a 78 y.o. female who presents with anxious mood and affect. She is frustrated because her body hurts with age related aches and pains and she has decided that she is "not going to accept this" and will be "unhappy because of it". Her thinking remains rigid and unforgiving when focused on self and she demonstrates difficulty adapting to the natural changes associated with aging. She questions herself throughout the session, if she is responding to others correctly, why others react to her in a certain way and what she may be doing or saying which leads others to respond to her in a way in which she interprets as negative. She remains socially active and focused on keeping busy. She needs frequent redirection and assurance in session. Her sleep and appetite are wnl.    Suicidal/Homicidal: Nowithout intent/plan  Therapist Response: Assessed patients current functioning and reviewed progress. Reviewed coping strategies. Assessed patients safety and assisted in identifying protective factors.  Reviewed crisis plan with patient. Assisted patient with the expression of her feelings of anxiety, frustration and self doubt. Explored ways in which patient can increase her self confidence.  Patients self doubt is strong, her thinking is rigid and she easily forgets how to reframe her thinking. She demonstrates subtle memory changes. Used CBT to assist patient with the identification of negative distortions and irrational thoughts. Encouraged patient to verbalize alternative and factual responses which challenge thought distortions. Reviewed patients self care plan. Assessed  progress related to self care. Patient's self  care is good. Recommend proper diet, regular exercise, socialization and recreation.   Plan: Return again in two weeks.  Diagnosis: Axis I: Generalized Anxiety Disorder    Axis II: No diagnosis    Annika Selke, LCSW 12/01/2011

## 2011-12-05 ENCOUNTER — Ambulatory Visit (INDEPENDENT_AMBULATORY_CARE_PROVIDER_SITE_OTHER): Payer: Medicare Other | Admitting: Psychiatry

## 2011-12-05 ENCOUNTER — Encounter (HOSPITAL_COMMUNITY): Payer: Self-pay | Admitting: Psychiatry

## 2011-12-05 VITALS — BP 120/72 | HR 64 | Wt 155.8 lb

## 2011-12-05 DIAGNOSIS — F411 Generalized anxiety disorder: Secondary | ICD-10-CM

## 2011-12-05 DIAGNOSIS — F419 Anxiety disorder, unspecified: Secondary | ICD-10-CM

## 2011-12-05 MED ORDER — SERTRALINE HCL 50 MG PO TABS: ORAL_TABLET | ORAL | Status: DC

## 2011-12-05 NOTE — Progress Notes (Signed)
Chief complaint I feel better.      History of present illness Patient is 78 year old Caucasian female who came for her followup appointment.  On her last visit we have increased her Zoloft to 50 mg.  Patient is compliant with the medication and reported no side effects.  She mentioned having nightmare last night but otherwise there has been no new issues and concern with the medication.  Patient continued to endorse physical symptoms of anxiety.  She is struggling to make decision about her meals.  She is under a lot of pressure from her daughter to move up [place] however patient does not want to leave.  She has good social network in this area.  She goes to church and she attends once a month lunch with her friends.  She has mixed emotions about her bills.  Recently her daughter is [place] and has not called her.  Patient likes increase dose of Zoloft and even wondering if she can take further.  In the past patient is very reluctant to try higher dose however recently she feel some improvement and wondering to the dose further can be increased.  She sleeping better.  She denies any agitation anger mood swing.  She denies any crying spells.  Her weight and blood pressure is stable.  She's not agitated aggressive or violent.  Current psychiatric medication Zoloft 50 mg daily   Medical history Patient has history of arthritis, hyperlipidemia, hemorrhoid, very), chronic constipation, GERD, recurrent UTI, Parkinson disease, chronic arrhythmia and edema of colon..  Mental status examination Patient is fairly groomed and appears to be in her stated age. She is thin built and appears anxious but cooperative.  Her speech is fast but clear and coherent. Her mood is anxious and her affect is constricted. She denies any auditory or visual hallucination.  There were no tremors or shakes present.  There were no paranoia or delusion present.  She denies any active or passive suicidal thoughts. There no  psychotic symptoms present. She's alert and oriented x3. Her attention and concentration is fair. Her insight judgment is fair her impulse control is okay.  Assessment Axis I Anxiety disorder NOS Axis II deferred Axis III see medical history Axis IV moderate Axis V 60-65  Plan I reviewed chart , progress note and discuss in detail the risks and benefits of medication.  I recommend to try Zoloft 75 mg daily .  At this time patient does not have any side effects.  We discussed the diagnosis problem and prognosis in detail.  I encourage her to discuss with her daughter about long-term move and then make a decision.  I recommend to see therapist regularly for coping and social skills.  I also recommend to call us if she is any question or concern about the medication she feels worsening of the symptoms.  I will see her again in 2 months.  Time spent 30 minutes.

## 2011-12-08 ENCOUNTER — Encounter (HOSPITAL_COMMUNITY): Payer: Self-pay | Admitting: Licensed Clinical Social Worker

## 2011-12-08 ENCOUNTER — Ambulatory Visit (INDEPENDENT_AMBULATORY_CARE_PROVIDER_SITE_OTHER): Payer: Medicare Other | Admitting: Licensed Clinical Social Worker

## 2011-12-08 DIAGNOSIS — F411 Generalized anxiety disorder: Secondary | ICD-10-CM

## 2011-12-08 DIAGNOSIS — F321 Major depressive disorder, single episode, moderate: Secondary | ICD-10-CM | POA: Diagnosis not present

## 2011-12-08 NOTE — Progress Notes (Signed)
   THERAPIST PROGRESS NOTE  Session Time: 8:30am-9:20am  Participation Level: Active  Behavioral Response: Well GroomedAlertAnxious and Depressed  Type of Therapy: Individual Therapy  Treatment Goals addressed: Coping  Interventions: CBT, Motivational Interviewing, Strength-based, Supportive and Reframing  Summary: [name] is a 78 y.o. female who presents with depressed mood and anxious affect. She reports an increase in her feelings of depression and anxiety. She is frustrated that she must keep herself busy in order to avoid feeling badly. She constantly looks for things to do in order to fill her feelings of discontent. She questions why she is unhappy and blames herself for feeling poorly. She reports that Dr. Lolly Mustache did recommend she increase her Zoloft does from 50mg  to 75mg , but she has not and she is resistant to do so. She is concerned that she will become addicted to it and engages in distorted, circular thinking, asking often what will happen if the higher does is ineffective. She is fearful that she will become more depressed as time goes on and she questions what would happen to her if this did occur. She remains focused on activity and plans to exercise today. Her sleep has increased which she is displeased about and she does not accept the natural changes which come with aging.   Suicidal/Homicidal: Nowithout intent/plan  Therapist Response: Assessed patients current functioning and reviewed progress. Reviewed coping strategies. Assessed patients safety and assisted in identifying protective factors.  Reviewed crisis plan with patient. Assisted patient with the expression of her feelings of anxiety and depression. Patients thinking is very rigid today. She is resistant to redirection and challenges this Clinical research associate when given feedback. Used CBT to assist patient with the identification of negative distortions and irrational thoughts. Encouraged patient to verbalize  alternative and factual responses which challenge thought distortions. Processed her fear related to increasing her medication dose and recommended and encouraged patient to take the higher does as prescribed.Used motivational interviewing to assist and encourage patient through the change process. Explored patients barriers to change.  Used DBT to practice mindfulness, review distraction list and improve distress tolerance skills. Reviewed patients self care plan. Assessed  progress related to self care. Patient's self care is good. Recommend proper diet, regular exercise, socialization and recreation.   Plan: Return again in one weeks.  Diagnosis: Axis I: Generalized Anxiety Disorder and Major Depression, Recurrent severe    Axis II: No diagnosis    Catherine Montecalvo, LCSW 12/08/2011

## 2011-12-10 ENCOUNTER — Encounter: Payer: Self-pay | Admitting: Family Medicine

## 2011-12-10 ENCOUNTER — Ambulatory Visit (INDEPENDENT_AMBULATORY_CARE_PROVIDER_SITE_OTHER): Payer: Medicare Other | Admitting: Family Medicine

## 2011-12-10 VITALS — BP 123/70 | HR 78 | Temp 98.3°F | Ht 71.0 in | Wt 160.6 lb

## 2011-12-10 DIAGNOSIS — H9220 Otorrhagia, unspecified ear: Secondary | ICD-10-CM | POA: Insufficient documentation

## 2011-12-10 DIAGNOSIS — H9210 Otorrhea, unspecified ear: Secondary | ICD-10-CM | POA: Diagnosis not present

## 2011-12-10 NOTE — Assessment & Plan Note (Signed)
New.  Pt w/ small, superficial abrasion to superior ear canal.  No evidence of infxn.  Reassurance provided.

## 2011-12-10 NOTE — Progress Notes (Signed)
  Subjective:    Patient ID: [name], female    DOB: 1927-10-31, 78 y.o.   MRN: [id number]  HPI scratched L ear Sunday night in her sleep.  Reports ear is bleeding but has slowed considerably.  Denies ear pain.  Has been using H2O2 drops.  No fevers.  Has not been wearing hearing aides.  Friend has her concerned about possible fungal infxn.  Denies discharge from ear, no odor, no pain.   Review of Systems For ROS see HPI     Objective:   Physical Exam  Vitals reviewed. Constitutional: She appears well-developed and well-nourished. No distress.  HENT:  Right Ear: Tympanic membrane, external ear and ear canal normal.  Left Ear: Tympanic membrane and external ear normal. Left ear exhibits lacerations (small scratch w/ scab present along superior ear canal, no induration, purulent drainage). No drainage or swelling. Tympanic membrane is not injected, not erythematous, not retracted and not bulging.          Assessment & Plan:

## 2011-12-10 NOTE — Patient Instructions (Addendum)
You have a scratch in your ear but it is healing well and there is no sign of a fungal infection Continue the hydrogen peroxide drops Start a daily antihistamine (Claritin, Zyrtec, or Allegra) to help w/ your itchy, watery eyes, runny nose, etc Call with any questions or concerns Hang in there!

## 2011-12-12 ENCOUNTER — Ambulatory Visit (HOSPITAL_COMMUNITY): Payer: Self-pay | Admitting: Licensed Clinical Social Worker

## 2011-12-12 DIAGNOSIS — K219 Gastro-esophageal reflux disease without esophagitis: Secondary | ICD-10-CM | POA: Diagnosis not present

## 2011-12-12 DIAGNOSIS — I4891 Unspecified atrial fibrillation: Secondary | ICD-10-CM | POA: Diagnosis not present

## 2011-12-12 DIAGNOSIS — R0602 Shortness of breath: Secondary | ICD-10-CM | POA: Diagnosis not present

## 2011-12-15 DIAGNOSIS — R413 Other amnesia: Secondary | ICD-10-CM | POA: Diagnosis not present

## 2011-12-15 DIAGNOSIS — G20 Parkinson's disease: Secondary | ICD-10-CM | POA: Diagnosis not present

## 2011-12-16 ENCOUNTER — Ambulatory Visit (INDEPENDENT_AMBULATORY_CARE_PROVIDER_SITE_OTHER): Payer: Medicare Other | Admitting: Pulmonary Disease

## 2011-12-16 ENCOUNTER — Encounter: Payer: Self-pay | Admitting: Pulmonary Disease

## 2011-12-16 VITALS — BP 102/70 | HR 68 | Temp 98.0°F | Ht 71.0 in | Wt 156.8 lb

## 2011-12-16 DIAGNOSIS — R0989 Other specified symptoms and signs involving the circulatory and respiratory systems: Secondary | ICD-10-CM | POA: Diagnosis not present

## 2011-12-16 DIAGNOSIS — R0609 Other forms of dyspnea: Secondary | ICD-10-CM

## 2011-12-16 DIAGNOSIS — J449 Chronic obstructive pulmonary disease, unspecified: Secondary | ICD-10-CM

## 2011-12-16 NOTE — Assessment & Plan Note (Signed)
The patient has minimal airflow obstruction by PFTs, and saw no change with a LABA.  She has never had wheezing on exam, and her history does not fit with asthma.  I have asked her to continue on as needed albuterol for now.

## 2011-12-16 NOTE — Patient Instructions (Addendum)
Will schedule for an exercise test to evaluate your shortness of breath further. Will arrange for followup once the results are available.

## 2011-12-16 NOTE — Progress Notes (Signed)
  Subjective:    Patient ID: [name], female    DOB: July 24, 1927, 78 y.o.   MRN: [id number]  HPI Patient comes in today for followup of her very mild COPD and dyspnea on exertion.  She saw no improvement with LABA, and now is only on as needed albuterol.  She has not required this, and states her breathing normalizes when she sits down for a short rest.  She denies any congestion or cough.  She is continuing to have significant dyspnea on exertion, and feels that she can do very little before becoming winded.   Review of Systems  Constitutional: Negative for fever and unexpected weight change.  HENT: Positive for postnasal drip. Negative for ear pain, nosebleeds, congestion, sore throat, rhinorrhea, sneezing, trouble swallowing, dental problem and sinus pressure.   Eyes: Negative for redness and itching.  Respiratory: Positive for cough, chest tightness, shortness of breath and wheezing.   Cardiovascular: Negative for palpitations and leg swelling.  Gastrointestinal: Negative for nausea and vomiting.  Genitourinary: Negative for dysuria.  Musculoskeletal: Negative for joint swelling.  Skin: Negative for rash.  Neurological: Negative for headaches.  Hematological: Does not bruise/bleed easily.  Psychiatric/Behavioral: Negative for dysphoric mood. The patient is not nervous/anxious.        Objective:   Physical Exam Thin female in no acute distress Nose without purulence or discharge noted Chest totally clear to auscultation, no wheezing Cardiac exam with regular rate and rhythm Lower extremities without edema, no cyanosis Alert and oriented, moves all 4 extremities.       Assessment & Plan:

## 2011-12-16 NOTE — Assessment & Plan Note (Signed)
The patient continues to have significant dyspnea on exertion that is greatly impacting her quality of life.  She continues to have clear lung fields, has a unremarkable chest x-ray, and minimal airflow obstruction that did not improve with a LABA.  I think it is very unlikely this is coming from a pulmonary source, but the patient wishes to continue evaluating.  I have scheduled her for a cardiopulmonary stress test to workup further.

## 2011-12-18 ENCOUNTER — Ambulatory Visit (HOSPITAL_COMMUNITY): Payer: Medicare Other | Attending: Pulmonary Disease

## 2011-12-18 ENCOUNTER — Ambulatory Visit (INDEPENDENT_AMBULATORY_CARE_PROVIDER_SITE_OTHER): Payer: Medicare Other | Admitting: Licensed Clinical Social Worker

## 2011-12-18 ENCOUNTER — Encounter (HOSPITAL_COMMUNITY): Payer: Self-pay | Admitting: Licensed Clinical Social Worker

## 2011-12-18 DIAGNOSIS — R0609 Other forms of dyspnea: Secondary | ICD-10-CM | POA: Diagnosis not present

## 2011-12-18 DIAGNOSIS — F321 Major depressive disorder, single episode, moderate: Secondary | ICD-10-CM | POA: Diagnosis not present

## 2011-12-18 DIAGNOSIS — R0989 Other specified symptoms and signs involving the circulatory and respiratory systems: Secondary | ICD-10-CM | POA: Insufficient documentation

## 2011-12-18 NOTE — Progress Notes (Signed)
   THERAPIST PROGRESS NOTE  Session Time: 8:30am-9:20am  Participation Level: Active  Behavioral Response: Well GroomedAlertAnxious  Type of Therapy: Individual Therapy  Treatment Goals addressed: Anxiety and Coping  Interventions: CBT, Strength-based, Supportive and Reframing  Summary: [name] is a 78 y.o. female who presents with anxious mood and bright affect. She reports slight improvement in her degree of depression and anxiety. She had an appointment with her neurologist, who diagnosed her with dementia with Lewy Bodies. She processes this information and today she is more accepting of these cognitive changes. Her anxiety has decreased as a result of the doctor explaining these changes. She remains focused on wanting to be accepted by her peers and is hurt that she is not invited out and included. She speaks negatively about herself, putting herself down in session. She continues to contemplate her decision about staying in [place] or moving closer to her children. Overall, her physical health is good and she continues to take excellent care of herself through exercise and healthy eating.   Suicidal/Homicidal: Nowithout intent/plan  Therapist Response: Assessed patients current functioning and reviewed progress. Reviewed coping strategies. Assessed patients safety and assisted in identifying protective factors.  Reviewed crisis plan with patient. Assisted patient with the expression of her feelings of anxiety and frustration with herself and her friends. Her thinking is not as distorted as last session and she reports some improvement in negative automatic thinking since increasing Zoloft from 50mg  to 75mg . Used CBT to assist patient with the identification of negative distortions and irrational thoughts. Encouraged patient to verbalize alternative and factual responses which challenge thought distortions. Reviewed patients self care plan. Assessed  progress related to self care.  Patient's self care is excellent. Recommend proper diet, regular exercise, socialization and recreation.   Plan: Return again in one to two weeks.  Diagnosis: Axis I: Generalized Anxiety Disorder    Axis II: No diagnosis    Jillien Yakel, LCSW 12/18/2011

## 2011-12-25 ENCOUNTER — Emergency Department (HOSPITAL_COMMUNITY)
Admission: EM | Admit: 2011-12-25 | Discharge: 2011-12-25 | Disposition: A | Discharge status: Home / Self Care | Payer: Medicare Other | Attending: Emergency Medicine | Admitting: Emergency Medicine

## 2011-12-25 ENCOUNTER — Encounter (HOSPITAL_COMMUNITY): Payer: Self-pay | Admitting: *Deleted

## 2011-12-25 ENCOUNTER — Other Ambulatory Visit: Payer: Self-pay

## 2011-12-25 ENCOUNTER — Emergency Department (HOSPITAL_COMMUNITY): Payer: Medicare Other

## 2011-12-25 ENCOUNTER — Ambulatory Visit (HOSPITAL_COMMUNITY): Payer: Self-pay | Admitting: Licensed Clinical Social Worker

## 2011-12-25 DIAGNOSIS — K219 Gastro-esophageal reflux disease without esophagitis: Secondary | ICD-10-CM | POA: Diagnosis not present

## 2011-12-25 DIAGNOSIS — Z8739 Personal history of other diseases of the musculoskeletal system and connective tissue: Secondary | ICD-10-CM | POA: Insufficient documentation

## 2011-12-25 DIAGNOSIS — Z9089 Acquired absence of other organs: Secondary | ICD-10-CM | POA: Insufficient documentation

## 2011-12-25 DIAGNOSIS — G20 Parkinson's disease: Secondary | ICD-10-CM | POA: Insufficient documentation

## 2011-12-25 DIAGNOSIS — R5381 Other malaise: Secondary | ICD-10-CM | POA: Diagnosis not present

## 2011-12-25 DIAGNOSIS — R0602 Shortness of breath: Secondary | ICD-10-CM

## 2011-12-25 DIAGNOSIS — R05 Cough: Secondary | ICD-10-CM

## 2011-12-25 DIAGNOSIS — E785 Hyperlipidemia, unspecified: Secondary | ICD-10-CM | POA: Diagnosis not present

## 2011-12-25 DIAGNOSIS — I4891 Unspecified atrial fibrillation: Secondary | ICD-10-CM | POA: Diagnosis not present

## 2011-12-25 DIAGNOSIS — R51 Headache: Secondary | ICD-10-CM | POA: Diagnosis not present

## 2011-12-25 DIAGNOSIS — J441 Chronic obstructive pulmonary disease with (acute) exacerbation: Secondary | ICD-10-CM | POA: Diagnosis not present

## 2011-12-25 DIAGNOSIS — R5383 Other fatigue: Secondary | ICD-10-CM | POA: Diagnosis not present

## 2011-12-25 DIAGNOSIS — Z87891 Personal history of nicotine dependence: Secondary | ICD-10-CM | POA: Diagnosis not present

## 2011-12-25 DIAGNOSIS — F341 Dysthymic disorder: Secondary | ICD-10-CM | POA: Diagnosis not present

## 2011-12-25 DIAGNOSIS — J449 Chronic obstructive pulmonary disease, unspecified: Secondary | ICD-10-CM | POA: Diagnosis not present

## 2011-12-25 DIAGNOSIS — G20A1 Parkinson's disease without dyskinesia, without mention of fluctuations: Secondary | ICD-10-CM | POA: Insufficient documentation

## 2011-12-25 DIAGNOSIS — R531 Weakness: Secondary | ICD-10-CM

## 2011-12-25 LAB — CBC WITH DIFFERENTIAL/PLATELET
Eosinophils Relative: 2 % (ref 0–5)
Hemoglobin: 12.9 g/dL (ref 12.0–15.0)
Lymphocytes Relative: 26 % (ref 12–46)
Lymphs Abs: 1.7 10*3/uL (ref 0.7–4.0)
MCV: 92.6 fL (ref 78.0–100.0)
Platelets: 217 10*3/uL (ref 150–400)
RBC: 4.18 MIL/uL (ref 3.87–5.11)
WBC: 6.5 10*3/uL (ref 4.0–10.5)

## 2011-12-25 LAB — COMPREHENSIVE METABOLIC PANEL
Albumin: 3.8 g/dL (ref 3.5–5.2)
BUN: 11 mg/dL (ref 6–23)
Calcium: 10 mg/dL (ref 8.4–10.5)
Chloride: 96 mEq/L (ref 96–112)
Creatinine, Ser: 0.86 mg/dL (ref 0.50–1.10)
Total Bilirubin: 0.3 mg/dL (ref 0.3–1.2)
Total Protein: 7.1 g/dL (ref 6.0–8.3)

## 2011-12-25 LAB — PRO B NATRIURETIC PEPTIDE: Pro B Natriuretic peptide (BNP): 310.9 pg/mL (ref 0–450)

## 2011-12-25 LAB — URINALYSIS, ROUTINE W REFLEX MICROSCOPIC
Nitrite: NEGATIVE
Specific Gravity, Urine: 1.008 (ref 1.005–1.030)
pH: 7.5 (ref 5.0–8.0)

## 2011-12-25 LAB — POCT I-STAT TROPONIN I: Troponin i, poc: 0 ng/mL (ref 0.00–0.08)

## 2011-12-25 MED ORDER — PREDNISONE 10 MG PO TABS: 50.0000 mg | ORAL_TABLET | Freq: Every day | ORAL | Status: AC

## 2011-12-25 MED ORDER — IPRATROPIUM BROMIDE 0.02 % IN SOLN
0.5000 mg | Freq: Once | RESPIRATORY_TRACT | Status: AC
  Administered 2011-12-25: 0.5 mg via RESPIRATORY_TRACT
  Filled 2011-12-25: qty 2.5

## 2011-12-25 MED ORDER — PREDNISONE 20 MG PO TABS
60.0000 mg | ORAL_TABLET | Freq: Once | ORAL | Status: AC
  Administered 2011-12-25: 60 mg via ORAL
  Filled 2011-12-25: qty 3

## 2011-12-25 MED ORDER — ALBUTEROL SULFATE (5 MG/ML) 0.5% IN NEBU
2.5000 mg | INHALATION_SOLUTION | Freq: Once | RESPIRATORY_TRACT | Status: AC
  Administered 2011-12-25: 2.5 mg via RESPIRATORY_TRACT
  Filled 2011-12-25: qty 0.5

## 2011-12-25 NOTE — ED Notes (Signed)
MD at bedside. 

## 2011-12-25 NOTE — ED Notes (Signed)
Patient from home for complain of shortness of breath, nausea, weakness, and headache for two days.  Patient is alert and oriented x 3.  Per EMS, patient ambulatory at the scene and 99% R/A

## 2011-12-25 NOTE — ED Notes (Signed)
Meal tray given 

## 2011-12-25 NOTE — ED Provider Notes (Signed)
History     CSN: [id number]  Arrival date & time 12/25/11  1015   First MD Initiated Contact with Patient 12/25/11 1041      Chief Complaint  Patient presents with  . Shortness of Breath  . Weakness  . Nausea  . Headache    Patient is a 78 year old female with past medical history as listed below and relevant for atrial fibrillation, recurrent UTIs, and mild COPD who presents with complaints of two-day history of generalized weakness intermittent palpitations, intermittent dyspnea on exertion, and worsening of chronic productive cough. Patient states that yesterday she noted it when she would get up to do her daily activities she would have palpitations as well as increasing amounts of shortness of breath. She also reports overall feeling very fatigued and unable to complete normal daily activities. She states at baseline she has a chronic cough over the last 2 days it has been more productive of yellow sputum. She denies any chest pain, fever, diarrhea, constipation, or any other symptoms.  (Consider location/radiation/quality/duration/timing/severity/associated sxs/prior treatment) Patient is a 78 y.o. female presenting with shortness of breath. The history is provided by the patient and medical records. No language interpreter was used.  Shortness of Breath  The current episode started yesterday. The problem occurs frequently. The problem has been unchanged. The problem is moderate. Nothing relieves the symptoms. The symptoms are aggravated by activity. Associated symptoms include cough, shortness of breath and wheezing. Pertinent negatives include no chest pain and no chest pressure. There was no intake of a foreign body. Her past medical history is significant for past wheezing. She has been less active. Urine output has been normal. The last void occurred less than 6 hours ago. There were no sick contacts. Recently, medical care has been given at this facility. Services received include  tests performed.    Past Medical History  Diagnosis Date  . Arthritis   . Hyperlipidemia   . 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 Chronic constipation   . Hemorrhoid   . Varicose veins   . Anxiety   . Depression   . GERD (gastroesophageal reflux disease)   . Atrial fibrillation   . Recurrent UTI   . Parkinson disease   . Cardiac arrhythmia due to congenital heart disease     Past Surgical History  Procedure Date  . Appendectomy   . Ileal resection 03/2007    Due to obstruction    Family History  Problem Relation Age of Onset  . Asthma Brother   . Alcohol abuse Brother   . Throat cancer Father   . Alcohol abuse Father   . Lupus Daughter   . Bipolar disorder Daughter   . Lupus Daughter   . Anxiety disorder Daughter   . Alcohol abuse Sister   . Alcohol abuse Maternal Grandfather   . Alcohol abuse Paternal Grandmother     History  Substance Use Topics  . Smoking status: Former Smoker -- 2.0 packs/day for 30 years    Types: Cigarettes    Quit date: 04/21/1980  . Smokeless tobacco: Not on file  . Alcohol Use: 1.2 oz/week    2 Glasses of wine per week     per month    OB History    Grav Para Term Preterm Abortions TAB SAB Ect Mult Living                  Review of Systems  Respiratory: Positive for cough, shortness of breath and wheezing.  Cardiovascular: Negative for chest pain.  All other systems reviewed and are negative.    Allergies  Adhesive; Amoxicillin; and Ciprofloxacin  Home Medications   Current Outpatient Rx  Name Route Sig Dispense Refill  . ACETAMINOPHEN 325 MG PO TABS Oral Take 650 mg by mouth every 6 (six) hours as needed. For pain     . ALBUTEROL SULFATE HFA 108 (90 BASE) MCG/ACT IN AERS Inhalation Inhale 2 puffs into the lungs 4 (four) times daily as needed.      . ASPIRIN 81 MG PO TABS Oral Take 81 mg by mouth as needed.     Marland Kitchen CALCIUM CARBONATE-VITAMIN D 600-400 MG-UNIT PO TABS Oral Take 1 tablet by mouth 2 (two) times  daily.      Marland Kitchen CRANBERRY-VITAMIN C-VITAMIN E 6000-100-3 MG-MG-UNIT PO CAPS Oral Take 1 capsule by mouth every morning.      Marland Kitchen DIGOXIN 0.125 MG PO TABS Oral Take 125 mcg by mouth daily.      Marland Kitchen FAMOTIDINE 20 MG PO TABS Oral Take 20 mg by mouth daily with supper.      . OMEGA-3 FATTY ACIDS 1000 MG PO CAPS Oral Take 1 g by mouth every morning.     Marland Kitchen FLECAINIDE ACETATE 50 MG PO TABS Oral Take 50 mg by mouth.      Marland Kitchen FLUTICASONE PROPIONATE 50 MCG/ACT NA SUSP Nasal Place 2 sprays into the nose daily as needed. For congestion     . ISOPROPYL ALCOHOL 95 % OT LIQD Both Ears Place 2 drops into both ears at bedtime.      . SERTRALINE HCL 50 MG PO TABS Oral Take 75 mg by mouth daily. Take 1 and half tab daily    . TRIMETHOPRIM 100 MG PO TABS Oral Take 1 tablet (100 mg total) by mouth at bedtime. 30 tablet 3    BP 138/76  Pulse 50  Temp 97.1 F (36.2 C) (Oral)  Resp 24  SpO2 100%  Physical Exam  Nursing note and vitals reviewed. Constitutional: She is oriented to person, place, and time. She appears well-developed and well-nourished.  HENT:  Head: Normocephalic and atraumatic.  Right Ear: External ear normal.  Left Ear: External ear normal.  Nose: Nose normal.  Mouth/Throat: Oropharynx is clear and moist.  Eyes: Conjunctivae and EOM are normal. Pupils are equal, round, and reactive to light.  Neck: Normal range of motion. Neck supple.  Cardiovascular: Regular rhythm, S1 normal, S2 normal and intact distal pulses.  Bradycardia present.   Pulmonary/Chest: Effort normal. No respiratory distress. She has wheezes (bilateral diffuse wheezing). She has no rales. She exhibits no tenderness.  Abdominal: Soft. Bowel sounds are normal. She exhibits no distension and no mass. There is no tenderness. There is no rebound and no guarding.  Musculoskeletal: Normal range of motion. She exhibits no edema and no tenderness.  Neurological: She is alert and oriented to person, place, and time. She has normal reflexes.  No cranial nerve deficit. Coordination normal.  Skin: Skin is warm and dry.  Psychiatric: She has a normal mood and affect.    ED Course  Procedures (including critical care time)  Labs Reviewed  COMPREHENSIVE METABOLIC PANEL - Abnormal; Notable for the following:    Sodium 134 (*)     GFR calc non Af Amer 62 (*)     GFR calc Af Amer 71 (*)     All other components within normal limits  URINALYSIS, ROUTINE W REFLEX MICROSCOPIC - Abnormal; Notable for the following:  Leukocytes, UA TRACE (*)     All other components within normal limits  DIGOXIN LEVEL - Abnormal; Notable for the following:    Digoxin Level 0.5 (*)     All other components within normal limits  CBC WITH DIFFERENTIAL  PRO B NATRIURETIC PEPTIDE  POCT I-STAT TROPONIN I  URINE MICROSCOPIC-ADD ON   Dg Chest 2 View  12/25/2011  *RADIOLOGY REPORT*  Clinical Data: Shortness of breath.  Wheezing.  Atrial fibrillation.  CHEST - 2 VIEW  Comparison: 05/14/2011  Findings: Changes of COPD again demonstrated.  No evidence of pulmonary infiltrate or edema.  No evidence of pleural effusion. Heart size is within normal limits.  No mass or lymphadenopathy identified.  Mild thoracic dextroscoliosis noted.  IMPRESSION: Stable exam.  COPD.  No active disease.   Original Report Authenticated By: Danae Orleans, M.D.       Date: 12/25/2011  Rate: 52  Rhythm: sinus bradycardia  QRS Axis: normal  Intervals: normal  ST/T Wave abnormalities: normal  Conduction Disutrbances:none  Narrative Interpretation:   Old EKG Reviewed: sinus bradycardia noted on today's ekg; otherwise no changes    1. Shortness of breath   2. COPD exacerbation   3. Cough   4. Generalized weakness       MDM    Patient is a 78 year old female who presents with generalized weakness, shortness of breath, and worsening of chronic productive cough. Upon arrival in the emergency department patient noted to be afebrile and vital signs remarkable for heart rate in  the 50s. On exam patient with diffuse bilateral wheezing, but otherwise unremarkable and as above. Given patient's presentation there was initial concern for mild COPD exacerbation, paroxysmal A. fib, UTI, versus digoxin toxicity.  Thus labs, EKG, and chest x-ray were completed.  EKG showed no significant changes from prior and no signs of ischemia.  Labs revealed mildly low digoxin but otherwise unremarkable.  CXR showed evidence of COPD but otherwise unremarkable.  Patient given duo-neb/PO prednisone and s/p treatment she reported that SOB improved significantly.  Clinical presentation c/w mild COPD exacerbation and thus treatment with PO steroids and close PCP follow up felt to be appropriate.  Patient discharged without acute events.          Johnney Ou, MD 12/25/11 [phone]

## 2011-12-26 ENCOUNTER — Ambulatory Visit (INDEPENDENT_AMBULATORY_CARE_PROVIDER_SITE_OTHER): Payer: Medicare Other | Admitting: Internal Medicine

## 2011-12-26 ENCOUNTER — Encounter: Payer: Self-pay | Admitting: Internal Medicine

## 2011-12-26 VITALS — BP 106/64 | HR 76 | Temp 98.0°F | Wt 155.0 lb

## 2011-12-26 DIAGNOSIS — R0609 Other forms of dyspnea: Secondary | ICD-10-CM

## 2011-12-26 DIAGNOSIS — R0600 Dyspnea, unspecified: Secondary | ICD-10-CM

## 2011-12-26 DIAGNOSIS — R531 Weakness: Secondary | ICD-10-CM

## 2011-12-26 DIAGNOSIS — R5381 Other malaise: Secondary | ICD-10-CM | POA: Diagnosis not present

## 2011-12-26 NOTE — Progress Notes (Signed)
  Subjective:    Patient ID: [name], female    DOB: 1927/05/07, 78 y.o.   MRN: [id number]  HPI She was seen in emergency room for shortness of breath and weakness 9/5. She was diagnosed with an exacerbation of COPD. She's been evaluated by Dr. Shelle Iron, Pulmonologist. His records of 12/16/11 were reviewed. It was his opinion that the major issue is not pulmonary; cardiopulmonary exercise test was performed 8/29. This showed excellent functional capacity without an overt stricture or limitation. She was noted to have an unusual breathing pattern. Heart rate response was inadequate; there were no ST-T changes or dysrhythmias noted. Blood pressure response was appropriate.  The picture is somewhat confusing & this may reflect are somewhat unusual history. She smoked from age [age] up to almost 2 packs a day. At that time she began an exercise program; she continued this until age 78 except for weights and machine exercises at this time.  She describes hoarseness & dyspepsia for several weeks. She rarely has dysphagia drinking water. There is no associated extrinsic nature of itchy, watery eyes, or sneezing.    Review of Systems She is on Digoxin & Flecanide from Dr. Jacinto Halim; her dig level was 0.5 in the emergency room. Other labs revealed a minimally decreased sodium of 134 and minimally reduced  GFR of 62. Troponin was negative. CBC and differential was also normal. Her BNP was 310.9 in emergency room; it had been 1210.0 one year ago. Her TSH was therapeutic in July     Objective:   Physical Exam General appearance is one of good health and nourishment w/o distress.Appears younger than stated age   Eyes: No conjunctival inflammation or scleral icterus is present.  Neck: Thyroid normal; 5 cm of  neck vein distention suggested at 10  Oral exam: Dental hygiene is good; lips and gums are healthy appearing.There is no oropharyngeal erythema or exudate noted. She clears her throat frequently and  is slightly hoarse  Heart:  Normal rate and regular rhythm. S1 and S2 normal without gallop, click, rub or other extra sounds.Grade 1/6 systolic murmur @ R base   Lungs: Rhonchi are noted more in the right posterior chest on left but appear to emanate from the upper airway. No wheezing, rales or increased work of breathing are noted. There is asymmetry of the thoracic musculature suggesting scoliosis.   Abdomen: bowel sounds normal, soft and non-tender without masses, organomegaly or hernias noted.  No guarding or rebound . No hepatojugular reflux  Extremities: No cyanosis or edema. Clubbing of the fingers is suggested  Skin:Warm & dry.  Intact without suspicious lesions or rashes ; no jaundice or tenting  Lymphatic: No lymphadenopathy is noted about the head, neck, axilla           Assessment & Plan:  #1 dyspnea associated with weakness in the context of significant smoking consumption and history of atrial fibrillation.  #2 recent hoarseness; clinically  there is suggestion of possible upper airway etiology of her rhonchi. Her last PFTs were over a year ago. This could R/O an upper airway obstructiove pattern. This will be discussed with Dr. Shelle Iron. Direct laryngoscopy is also an option to evaluate the symptoms and signs.

## 2011-12-26 NOTE — Patient Instructions (Addendum)
Review and correct the record as indicated. Please share record with all medical staff seen.   If you activate My Chart; the results can be released to you as soon as they populate from the lab. If you choose not to use this program; the labs have to be reviewed, copied & mailed   causing a delay in getting the results to you.  

## 2011-12-26 NOTE — ED Provider Notes (Signed)
  I performed a history and physical examination of [name] and discussed her management with Dr. Ronald Lobo.  I agree with the history, physical, assessment, and plan of care, with the following exceptions: None  I saw the ECG, relevant labs and studies - I agree with the interpretation.  On my exam, after the patient's labs and x-ray he'll return to the patient was in no distress, speaking clearly, sitting upright, with unremarkable vital signs.  We discussed all findings, Poss, and the patient was discharged in stable condition with PMD followup. Elyse Jarvis, MD 12/26/11 1958

## 2011-12-29 ENCOUNTER — Encounter: Payer: Self-pay | Admitting: Pulmonary Disease

## 2011-12-29 ENCOUNTER — Other Ambulatory Visit: Payer: Self-pay | Admitting: Internal Medicine

## 2011-12-29 ENCOUNTER — Ambulatory Visit (INDEPENDENT_AMBULATORY_CARE_PROVIDER_SITE_OTHER): Payer: Medicare Other | Admitting: Pulmonary Disease

## 2011-12-29 VITALS — BP 110/72 | HR 58 | Temp 97.8°F | Ht 71.0 in | Wt 156.4 lb

## 2011-12-29 DIAGNOSIS — J449 Chronic obstructive pulmonary disease, unspecified: Secondary | ICD-10-CM | POA: Diagnosis not present

## 2011-12-29 DIAGNOSIS — G478 Other sleep disorders: Secondary | ICD-10-CM

## 2011-12-29 DIAGNOSIS — R0609 Other forms of dyspnea: Secondary | ICD-10-CM

## 2011-12-29 DIAGNOSIS — R0600 Dyspnea, unspecified: Secondary | ICD-10-CM

## 2011-12-29 NOTE — Patient Instructions (Addendum)
Will try on dulera in the event this may represent asthma.  Take 2 puffs in am and pm everyday whether you need or not.  Rinse mouth very well after using. Please call me in 3 weeks with how things are going.  If you are still having issues, will have ENT look at your voice box.

## 2011-12-29 NOTE — Assessment & Plan Note (Signed)
The patient continues to have episodic dyspnea, and recently went to the emergency room where she was given a short course of prednisone.  She thinks this has helped.  She has been tried on a long-acting beta agonist without any change.  Perhaps this may be asthma, and therefore would like to treat her with an inhaled corticosteroid as a trial.  If she does not see any improvement with this, I suspect her mild airflow obstruction has nothing to do with her symptoms.  This may be all vocal cord dysfunction, especially with her high level of anxiety.  She does note a feeling of throat tightness during these episodes.  If she does not respond to an inhaled corticosteroid, will refer to otolaryngology for completeness.  If nothing is found, I would consider sending her to the voice disorder center at [facility].

## 2011-12-29 NOTE — Progress Notes (Signed)
  Subjective:    Patient ID: [name], female    DOB: 12-Nov-1927, 78 y.o.   MRN: [id number]  HPI The patient comes in today for an acute sick visit.  She has episodic dyspnea on exertion, and workup to date has only shown very mild airflow obstruction.  She did not respond to a trial of long-acting beta agonist, and recently underwent a CPST which showed excellent functional capacity when compared to norms.  There was no circulatory limitation, but her very mild airflow obstruction was documented again.  She really did not seem to have any ventilatory impairment.  Since the study, the patient has been to the ER with increasing shortness of breath, and was treated with a short course of prednisone.  She thinks this may have helped her breathing.  She also continues to have an abnormal sensation in her throat area during these periods of audible wheezing.  This raises the suspicion of vocal cord dysfunction.   Review of Systems  Constitutional: Negative for fever and unexpected weight change.  HENT: Positive for congestion and trouble swallowing. Negative for ear pain, nosebleeds, sore throat, rhinorrhea, sneezing, dental problem, postnasal drip and sinus pressure.   Eyes: Negative for redness and itching.  Respiratory: Positive for cough, chest tightness, shortness of breath and wheezing.   Cardiovascular: Positive for palpitations and leg swelling.  Gastrointestinal: Negative for nausea and vomiting.  Genitourinary: Negative for dysuria.  Musculoskeletal: Negative for joint swelling.  Skin: Negative for rash.  Neurological: Positive for headaches.  Hematological: Bruises/bleeds easily.  Psychiatric/Behavioral: Negative for dysphoric mood. The patient is nervous/anxious.        Objective:   Physical Exam Thin female in no acute distress Nose without purulence or discharge noted Neck without lymphadenopathy or thyromegaly Chest totally clear to auscultation, no wheezing Cardiac  exam with regular rate and rhythm Lower extremities without edema, no cyanosis Alert and oriented, moves all 4 extremities.       Assessment & Plan:

## 2011-12-29 NOTE — Assessment & Plan Note (Signed)
I continue to have a difficult time matching her degree of airflow obstruction, and the results of her CPST, to her level of symptoms.  I wonder how much her anxiety plays in all of this, and how it may contribute to possible vocal cord dysfunction.

## 2011-12-30 ENCOUNTER — Telehealth: Payer: Self-pay

## 2011-12-30 NOTE — Telephone Encounter (Signed)
Patient was informed of ENT referral and would like to know if Dr.Hopper feels that is necessary for she was also told to f/u with Dr.Clance (Pulmonary) in which she did already.  Dr.Hopper please advise if it is necessary for patient to see ENT

## 2011-12-30 NOTE — Telephone Encounter (Signed)
Dr Shelle Iron states that he will refer you to ENT if you do not respond to the  new prescription from him. ENT consult can be canceled at this time.

## 2011-12-30 NOTE — Telephone Encounter (Signed)
Patient aware of Dr.Hopper's response.Patient will call to cancel appointment

## 2012-01-03 DIAGNOSIS — R0989 Other specified symptoms and signs involving the circulatory and respiratory systems: Secondary | ICD-10-CM | POA: Diagnosis not present

## 2012-01-03 DIAGNOSIS — R0609 Other forms of dyspnea: Secondary | ICD-10-CM

## 2012-01-05 ENCOUNTER — Other Ambulatory Visit: Payer: Self-pay | Admitting: Pulmonary Disease

## 2012-01-05 ENCOUNTER — Telehealth: Payer: Self-pay | Admitting: Pulmonary Disease

## 2012-01-05 DIAGNOSIS — R0609 Other forms of dyspnea: Secondary | ICD-10-CM

## 2012-01-05 NOTE — Telephone Encounter (Signed)
Pt needs ov with me in 6mos.

## 2012-01-06 NOTE — Telephone Encounter (Signed)
LMOMTCB x 1 

## 2012-01-06 NOTE — Telephone Encounter (Signed)
Pt has ROV scheduled w/ [facility] for 07/05/12.  Pt has also made an appt w/ Dr. Jearld Fenton of ENT for the 25th @ 2:00 pm.  Pt stated if there is anything else, please call back.  Antionette Fairy

## 2012-01-07 NOTE — Telephone Encounter (Signed)
Nothing further is needed. 

## 2012-01-08 ENCOUNTER — Ambulatory Visit (INDEPENDENT_AMBULATORY_CARE_PROVIDER_SITE_OTHER): Payer: Medicare Other | Admitting: Licensed Clinical Social Worker

## 2012-01-08 ENCOUNTER — Encounter (HOSPITAL_COMMUNITY): Payer: Self-pay | Admitting: Licensed Clinical Social Worker

## 2012-01-08 DIAGNOSIS — F411 Generalized anxiety disorder: Secondary | ICD-10-CM

## 2012-01-08 DIAGNOSIS — F321 Major depressive disorder, single episode, moderate: Secondary | ICD-10-CM

## 2012-01-08 NOTE — Progress Notes (Signed)
   THERAPIST PROGRESS NOTE  Session Time: 8:30am-9:20am  Participation Level: Active  Behavioral Response: Well GroomedAlertAnxious  Type of Therapy: Individual Therapy  Treatment Goals addressed: Anxiety and Coping  Interventions: CBT, Solution Focused, Supportive and Reframing  Summary: [name] is a 78 y.o. female who presents with anxious mood and bright affect. She was recently taken to the ED in an ambulance for difficulty breathing and processes her experience and her frustration that her doctors cannot agree upon a diagnosis. She is confused because one doctor says she has asthma, while the other believes she has emphysema. She is to follow up at the ENT next week for further testing. She remains active, but continues to struggle with strong self doubt, frequently questioning others intentions and how they feel about her. She is anxious that others have negative thoughts about her and that she displays traits which others find unattractive. Overall, she remains very active, exercising and socializing. Her sleep and appetite are wnl.    Suicidal/Homicidal: Nowithout intent/plan  Therapist Response: Assessed patients current functioning and reviewed progress. Reviewed coping strategies. Assessed patients safety and assisted in identifying protective factors.  Reviewed crisis plan with patient. Assisted patient with the expression of her feelings of anxiety. Used CBT to assist patient with the identification of negative distortions and irrational thoughts. Encouraged patient to verbalize alternative and factual responses which challenge thought distortions. Reviewed patients self care plan. Assessed  progress related to self care. Patient's self care is excellent. Recommend proper diet, regular exercise, socialization and recreation.   Plan: Return again in one to two weeks.  Diagnosis: Axis I: Generalized Anxiety Disorder    Axis II: No diagnosis    Asna Muldrow,  LCSW 01/08/2012

## 2012-01-14 DIAGNOSIS — R490 Dysphonia: Secondary | ICD-10-CM | POA: Diagnosis not present

## 2012-01-14 DIAGNOSIS — R0609 Other forms of dyspnea: Secondary | ICD-10-CM | POA: Diagnosis not present

## 2012-01-14 DIAGNOSIS — K219 Gastro-esophageal reflux disease without esophagitis: Secondary | ICD-10-CM | POA: Diagnosis not present

## 2012-01-19 ENCOUNTER — Telehealth: Payer: Self-pay | Admitting: Pulmonary Disease

## 2012-01-19 ENCOUNTER — Telehealth: Payer: Self-pay | Admitting: Family Medicine

## 2012-01-19 MED ORDER — BECLOMETHASONE DIPROPIONATE 80 MCG/ACT IN AERS: 2.0000 | INHALATION_SPRAY | Freq: Two times a day (BID) | RESPIRATORY_TRACT | Status: DC

## 2012-01-19 NOTE — Telephone Encounter (Signed)
There is no cheaper alternative than prilosec.

## 2012-01-19 NOTE — Telephone Encounter (Signed)
If dulera didn't help her symptoms, I agree with changing to something simpler and cheaper for treatment of her probable mild asthma. Would try qvar  2 inhalations am and pm each day.  Rinse well. #1, 6 fills.

## 2012-01-19 NOTE — Telephone Encounter (Signed)
Pt was seen by [facility] on 12/29/11:  Patient Instructions     Will try on dulera in the event this may represent asthma. Take 2 puffs in am and pm everyday whether you need or not. Rinse mouth very well after using.  Please call me in 3 weeks with how things are going. If you are still having issues, will have ENT look at your voice box.    -----  Called, spoke with pt.  States she sees no improvement in breathing with dulera, and it's not covered by her insurance.  Reports she is still having DOE and heaviness in diaphragm mainly around noon and in the evenings.  Denies wheezing and cough.  Has seen Dr. Jearld Fenton. Per pt, he thinks this may be reflux and has her doing a trial of prilosec x 1 month.  If this does not help, will refer to [facility].  Pt would like to know if there is anything generic, otc, or less expensive that she could try.  [facility], pls advise.  Thank you.

## 2012-01-19 NOTE — Telephone Encounter (Signed)
See other note.  This is a duplicate.

## 2012-01-19 NOTE — Telephone Encounter (Signed)
Pt will be back after 3;00

## 2012-01-19 NOTE — Telephone Encounter (Signed)
Pt aware of [facility] recs. I have sent RX into the pharmacy

## 2012-01-19 NOTE — Telephone Encounter (Signed)
lmomtcb x1 

## 2012-01-19 NOTE — Telephone Encounter (Signed)
Patient called and would like to know if she should continue taking trimethoprim or not. She states she needs to know bc she can get a 90 day supply for cheaper than a 30 day supply. Call pt back at [phone]

## 2012-01-19 NOTE — Telephone Encounter (Signed)
The pt is stating that the [facility] is too expensive and she wants to know if there is a cheaper alternative for the inhaler. Pls advise.

## 2012-01-19 NOTE — Telephone Encounter (Signed)
Please advise 

## 2012-01-19 NOTE — Telephone Encounter (Signed)
.  left message to have patient return my call.  

## 2012-01-19 NOTE — Telephone Encounter (Signed)
Needs to discuss this w/ Dr Vernie Ammons at [facility] as he is the one that started this

## 2012-01-20 NOTE — Telephone Encounter (Signed)
Spoke to pt to advise results/instructions. Pt understood. Pt will contact the urology office to clarify

## 2012-01-23 ENCOUNTER — Ambulatory Visit (HOSPITAL_COMMUNITY): Payer: Self-pay | Admitting: Licensed Clinical Social Worker

## 2012-01-30 ENCOUNTER — Encounter (HOSPITAL_COMMUNITY): Payer: Self-pay | Admitting: Licensed Clinical Social Worker

## 2012-01-30 ENCOUNTER — Ambulatory Visit (INDEPENDENT_AMBULATORY_CARE_PROVIDER_SITE_OTHER): Payer: Medicare Other | Admitting: Licensed Clinical Social Worker

## 2012-01-30 DIAGNOSIS — F411 Generalized anxiety disorder: Secondary | ICD-10-CM

## 2012-01-30 NOTE — Progress Notes (Signed)
   THERAPIST PROGRESS NOTE  Session Time: 1:00pm-1:50pm  Participation Level: Active  Behavioral Response: Well GroomedAlertEuthymic  Type of Therapy: Individual Therapy  Treatment Goals addressed: Coping  Interventions: CBT, Strength-based, Supportive and Reframing  Summary: [name] is a 78 y.o. female who presents with euthymic mood and bright affect. She reports doing well, that she is in good health and has been enjoying this month as it is her birthday month and she is being taken out for meals with many of her friends. While she does enjoy herself, she continues to struggle with low self esteem, negative and distorted thinking and negative projection. She endorses anxiety about her daughter who is unemployed and she is worried that her other daughter is supporting her financially and patient does not want this to occur because she is fearful that it will cause tension between her children. She is working on accepting that she has been diagnosed with Parkinsons disease. Overall, she is healthy, eating and sleeping well.    Suicidal/Homicidal: Nowithout intent/plan  Therapist Response: Assessed patients current functioning and reviewed progress. Reviewed coping strategies. Assessed patients safety and assisted in identifying protective factors.  Reviewed crisis plan with patient. Assisted patient with the expression of her feelings of anxierty. Patient demonstrates negative and distorted thought patterns with extreme self doubt. Used CBT to assist patient with the identification of negative distortions and irrational thoughts. Encouraged patient to verbalize alternative and factual responses which challenge thought distortions. Used DBT to practice mindfulness, review distraction list and improve distress tolerance skills. Reviewed patients self care plan. Assessed  progress related to self care. Patient's self care is excellent. Recommend proper diet, regular exercise,  socialization and recreation.   Plan: Return again in two weeks.  Diagnosis: Axis I: Generalized Anxiety Disorder    Axis II: No diagnosis    Wilda Wetherell, LCSW 01/30/2012

## 2012-02-06 ENCOUNTER — Ambulatory Visit (INDEPENDENT_AMBULATORY_CARE_PROVIDER_SITE_OTHER): Payer: Medicare Other | Admitting: Psychiatry

## 2012-02-06 ENCOUNTER — Encounter (HOSPITAL_COMMUNITY): Payer: Self-pay | Admitting: Psychiatry

## 2012-02-06 VITALS — BP 120/72 | HR 60 | Wt 156.4 lb

## 2012-02-06 DIAGNOSIS — F411 Generalized anxiety disorder: Secondary | ICD-10-CM

## 2012-02-06 DIAGNOSIS — F419 Anxiety disorder, unspecified: Secondary | ICD-10-CM

## 2012-02-06 MED ORDER — SERTRALINE HCL 50 MG PO TABS: 75.0000 mg | ORAL_TABLET | Freq: Every day | ORAL | Status: DC

## 2012-02-06 NOTE — Progress Notes (Signed)
[facility] [address] Progress Note  [name] [id number] 78 y.o.  02/06/2012 10:30 AM  Chief Complaint: I still have anxiety.  But I don't want to change my medication.  History of Present Illness: Patient came for her followup appointment.  We have increased her Zoloft to 75 mg on her last visit.  She is feeling better but continued to have anxiety symptoms.  She is very concerned about her physical health.  She is preoccupied with her somatic symptoms.  She has seen multiple doctors.  Recently she has note is back pain jaw pain and shoulder pain.  However she realized that this pain is not meal and she has it in the past.  It is not getting worse and comes on and off.  She denies any chest pain, shortness of breath, nausea or sweating.  She admitted that it could be due to her anxiety and she thinks a lot about her body.  She scheduled to see her cardiologist in February.  She recently started Prilosec which is helping her cough.  She do not remember very well heart medication list and dosage but remembered taking Zoloft 75 mg daily.  She's not drinking or using any illegal substance.  She denies any major panic attack.  She sleeping better.  She is able to do her daily activities.  She is more active in her life.  She denies any crying spells agitation anger mood swing. Suicidal Ideation: No Plan Formed: No Patient has means to carry out plan: No  Homicidal Ideation: No Plan Formed: No Patient has means to carry out plan: No  Review of Systems: Psychiatric: Agitation: No Hallucination: No Depressed Mood: No Insomnia: No Hypersomnia: No Altered Concentration: No Feels Worthless: No Grandiose Ideas: No Belief In Special Powers: No New/Increased Substance Abuse: No Compulsions: No  Neurologic: Headache: Yes Seizure: No Paresthesias: No  Past Medical Family, Social History: Patient has history of arthritis, hyperlipidemia, hemorrhoid, chronic constipation, GERD,  recurrent UTI, Parkinson disease, chronic arrhythmia.  She see physician at [facility].  Patient has 2 daughter.  Her parents are deceased.  Outpatient Encounter Prescriptions as of 02/06/2012  Medication Sig Dispense Refill  . aspirin 81 MG tablet Take 81 mg by mouth daily.       . beclomethasone (QVAR) 80 MCG/ACT inhaler Inhale 2 puffs into the lungs 2 (two) times daily.  1 Inhaler  6  . digoxin (LANOXIN) 0.125 MG tablet Take 125 mcg by mouth daily.        Marland Kitchen omeprazole (PRILOSEC) 10 MG capsule Take 10 mg by mouth daily.      . sertraline (ZOLOFT) 50 MG tablet Take 1.5 tablets (75 mg total) by mouth daily. Take 1 and half tab daily  45 tablet  0  . trimethoprim (TRIMPEX) 100 MG tablet Take 100 mg by mouth at bedtime.      Marland Kitchen DISCONTD: sertraline (ZOLOFT) 50 MG tablet Take 75 mg by mouth daily. Take 1 and half tab daily      . albuterol (VENTOLIN HFA) 108 (90 BASE) MCG/ACT inhaler Inhale 2 puffs into the lungs 4 (four) times daily as needed. For shortness of breath      . Calcium Carbonate-Vitamin D (CALCIUM 600+D) 600-400 MG-UNIT per tablet Take 1 tablet by mouth 2 (two) times daily.        . Cranberry-Vitamin C-Vitamin E (CRANBERRY SUPER STRENGTH) 6000-100-3 MG-MG-UNIT CAPS Take 1 capsule by mouth every morning.        . famotidine (PEPCID)  20 MG tablet Take 20 mg by mouth daily as needed. indigestion      . fish oil-omega-3 fatty acids 1000 MG capsule Take 1 g by mouth every morning.       . flecainide (TAMBOCOR) 50 MG tablet 25 mg 2 (two) times daily.       . naproxen sodium (ANAPROX) 220 MG tablet Take 220 mg by mouth once.        Past Psychiatric History/Hospitalization(s): Anxiety: Yes Bipolar Disorder: No Depression: No Mania: No Psychosis: No Schizophrenia: No Personality Disorder: No Hospitalization for psychiatric illness: No History of Electroconvulsive Shock Therapy: No Prior Suicide Attempts: No  Physical Exam: Constitutional:  BP 120/72  Pulse 60  Wt 156 lb 6.4 oz  (70.943 kg)  General Appearance: alert, oriented, no acute distress  Musculoskeletal: Strength & Muscle Tone: decreased Gait & Station: shuffle Patient leans: Front  Psychiatric: Speech (describe rate, volume, coherence, spontaneity, and abnormalities if any): Fast and rapid with increased tone and volume.  Thought Process (describe rate, content, abstract reasoning, and computation): Fast and rambling but coherent.  Preoccupied with her somatic complaints.  Associations: Relevant and Loose  Thoughts: normal  Mental Status: Orientation: oriented to person, place and time/date Mood & Affect: anxiety Attention Span & Concentration: Fair  Medical Decision Making (Choose Three): Established Problem, Stable/Improving (1), New Problem, with no additional work-up planned (3), Review of Last Therapy Session (1), Review or order medicine tests (1), Review of Medication Regimen & Side Effects (2) and Review of New Medication or Change in Dosage (2)  Assessment: Axis I: Anxiety disorder  Axis II: Deferred  Axis III: History of arthritis, hyperlipidemia, hemorrhoid, chronic constipation, GERD, recurrent UTI, Parkinson disease, chronic arrhythmia.  Recent back and shoulder pain.  Axis IV: Mild to moderate  Axis V: 60-65   Plan: I discuss the patient in length about her chronic somatic symptoms.  She realized that her shoulder and back pain could be anxiety related.  Her vitals are stable.  She is feeling better with increase Zoloft.  I strongly encouraged her to discuss with her primary care physician of ask cardiologist for early appointment.  She should get her baseline EKG.  I explained risks and benefits of medication.  She does not have any associated symptoms with her back pain, shoulder pain and jaw pain.  However it will be beneficial if she see primary care physician or cardiologist early to relieve some of her anxiety.  At this time patient does not have any side effects of Zoloft.   She will see therapist for coping and social skills.  I will see her again in 3 months however recommend to call us if she is any question or concern about the medication.  She like to get 90 days refill for mail-in pharmacy.  However she do not have any information about that pharmacy and she will call with more details.  I also provided a list of medication which is on the record.  I recommend to check if the dosage are okay since she do not remember dosage and the medication very well.  I asked her to bring the correct dosage annual the medication on her appointment.  More than 50% of the time spent in counseling, psychoeducation and coordination of care.  Mirabel Ahlgren T., MD 02/06/2012

## 2012-02-09 ENCOUNTER — Encounter (HOSPITAL_COMMUNITY): Payer: Self-pay | Admitting: Licensed Clinical Social Worker

## 2012-02-09 ENCOUNTER — Other Ambulatory Visit (HOSPITAL_COMMUNITY): Payer: Self-pay | Admitting: *Deleted

## 2012-02-09 ENCOUNTER — Ambulatory Visit (INDEPENDENT_AMBULATORY_CARE_PROVIDER_SITE_OTHER): Payer: Medicare Other | Admitting: Licensed Clinical Social Worker

## 2012-02-09 DIAGNOSIS — Z23 Encounter for immunization: Secondary | ICD-10-CM | POA: Diagnosis not present

## 2012-02-09 DIAGNOSIS — F419 Anxiety disorder, unspecified: Secondary | ICD-10-CM

## 2012-02-09 DIAGNOSIS — F411 Generalized anxiety disorder: Secondary | ICD-10-CM

## 2012-02-09 MED ORDER — SERTRALINE HCL 50 MG PO TABS: 75.0000 mg | ORAL_TABLET | Freq: Every day | ORAL | Status: DC

## 2012-02-09 NOTE — Progress Notes (Signed)
   THERAPIST PROGRESS NOTE  Session Time: 8:30am-9:20am  Participation Level: Active  Behavioral Response: Well GroomedAlertAnxious  Type of Therapy: Individual Therapy  Treatment Goals addressed: Coping  Interventions: CBT, Strength-based, Supportive and Reframing  Summary: [name] is a 78 y.o. female who presents with anxious mood and affect. She is feeling well physically, is able to exercise and is pleased about this. She endorses anxiety and concern over her daughters and is bothered that her one daughter is financially assisting the other. She reflects upon her own family of origin experience and being taught that she should not rely upon anyone else and that it is a weakness to rely upon others. She is concerned that her daughter will be taken advantage of by her other daughter. Her self doubt is strong today, she is easily distracted and tangential in session. She needs frequent redirection and assurance. Her sleep and appetite are wnl. She remains social and active.    Suicidal/Homicidal: Nowithout intent/plan  Therapist Response: Assessed patients current functioning and reviewed progress. Reviewed coping strategies. Assessed patients safety and assisted in identifying protective factors.  Reviewed crisis plan with patient. Assisted patient with the expression of her feelings of anxiety. Used CBT to assist patient with the identification of negative distortions and irrational thoughts. Encouraged patient to verbalize alternative and factual responses which challenge thought distortions. Reviewed healthy boundaries and assertive communication. Used DBT to practice mindfulness, review distraction list and improve distress tolerance skills. Reviewed patients self care plan. Assessed  progress related to self care. Patient's self care is good. Recommend proper diet, regular exercise, socialization and recreation.   Plan: Return again in two weeks.  Diagnosis: Axis I:  Generalized Anxiety Disorder    Axis II: No diagnosis    Jamell Opfer, LCSW 02/09/2012

## 2012-02-10 ENCOUNTER — Emergency Department (HOSPITAL_BASED_OUTPATIENT_CLINIC_OR_DEPARTMENT_OTHER): Payer: Medicare Other

## 2012-02-10 ENCOUNTER — Encounter (HOSPITAL_BASED_OUTPATIENT_CLINIC_OR_DEPARTMENT_OTHER): Payer: Self-pay

## 2012-02-10 ENCOUNTER — Emergency Department (HOSPITAL_BASED_OUTPATIENT_CLINIC_OR_DEPARTMENT_OTHER)
Admission: EM | Admit: 2012-02-10 | Discharge: 2012-02-10 | Disposition: A | Discharge status: Home / Self Care | Payer: Medicare Other | Attending: Emergency Medicine | Admitting: Emergency Medicine

## 2012-02-10 ENCOUNTER — Telehealth: Payer: Self-pay | Admitting: Family Medicine

## 2012-02-10 DIAGNOSIS — Z9889 Other specified postprocedural states: Secondary | ICD-10-CM | POA: Diagnosis not present

## 2012-02-10 DIAGNOSIS — Z87440 Personal history of urinary (tract) infections: Secondary | ICD-10-CM | POA: Insufficient documentation

## 2012-02-10 DIAGNOSIS — E785 Hyperlipidemia, unspecified: Secondary | ICD-10-CM | POA: Diagnosis not present

## 2012-02-10 DIAGNOSIS — G20 Parkinson's disease: Secondary | ICD-10-CM | POA: Diagnosis not present

## 2012-02-10 DIAGNOSIS — M1990 Unspecified osteoarthritis, unspecified site: Secondary | ICD-10-CM | POA: Insufficient documentation

## 2012-02-10 DIAGNOSIS — Z79899 Other long term (current) drug therapy: Secondary | ICD-10-CM | POA: Insufficient documentation

## 2012-02-10 DIAGNOSIS — Z8679 Personal history of other diseases of the circulatory system: Secondary | ICD-10-CM | POA: Diagnosis not present

## 2012-02-10 DIAGNOSIS — Z8659 Personal history of other mental and behavioral disorders: Secondary | ICD-10-CM | POA: Diagnosis not present

## 2012-02-10 DIAGNOSIS — Z7982 Long term (current) use of aspirin: Secondary | ICD-10-CM | POA: Diagnosis not present

## 2012-02-10 DIAGNOSIS — K219 Gastro-esophageal reflux disease without esophagitis: Secondary | ICD-10-CM | POA: Diagnosis not present

## 2012-02-10 DIAGNOSIS — K5900 Constipation, unspecified: Secondary | ICD-10-CM | POA: Diagnosis not present

## 2012-02-10 DIAGNOSIS — R079 Chest pain, unspecified: Secondary | ICD-10-CM

## 2012-02-10 DIAGNOSIS — M279 Disease of jaws, unspecified: Secondary | ICD-10-CM | POA: Insufficient documentation

## 2012-02-10 DIAGNOSIS — M79609 Pain in unspecified limb: Secondary | ICD-10-CM | POA: Diagnosis not present

## 2012-02-10 DIAGNOSIS — Z87891 Personal history of nicotine dependence: Secondary | ICD-10-CM | POA: Diagnosis not present

## 2012-02-10 DIAGNOSIS — G20A1 Parkinson's disease without dyskinesia, without mention of fluctuations: Secondary | ICD-10-CM | POA: Insufficient documentation

## 2012-02-10 LAB — CBC
HCT: 35.8 % — ABNORMAL LOW (ref 36.0–46.0)
MCHC: 33.2 g/dL (ref 30.0–36.0)
MCV: 93 fL (ref 78.0–100.0)
RDW: 14.2 % (ref 11.5–15.5)

## 2012-02-10 LAB — PROTIME-INR
INR: 0.98 (ref 0.00–1.49)
Prothrombin Time: 12.9 seconds (ref 11.6–15.2)

## 2012-02-10 LAB — COMPREHENSIVE METABOLIC PANEL
Albumin: 3.7 g/dL (ref 3.5–5.2)
BUN: 10 mg/dL (ref 6–23)
Creatinine, Ser: 0.9 mg/dL (ref 0.50–1.10)
Total Bilirubin: 0.2 mg/dL — ABNORMAL LOW (ref 0.3–1.2)
Total Protein: 6.9 g/dL (ref 6.0–8.3)

## 2012-02-10 MED ORDER — ASPIRIN 81 MG PO CHEW
324.0000 mg | CHEWABLE_TABLET | Freq: Once | ORAL | Status: AC
  Administered 2012-02-10: 324 mg via ORAL
  Filled 2012-02-10: qty 3
  Filled 2012-02-10 (×2): qty 1

## 2012-02-10 MED ORDER — NITROGLYCERIN 0.4 MG SL SUBL
0.4000 mg | SUBLINGUAL_TABLET | SUBLINGUAL | Status: DC | PRN
  Filled 2012-02-10: qty 25

## 2012-02-10 MED ORDER — SODIUM CHLORIDE 0.9 % IV SOLN
1000.0000 mL | INTRAVENOUS | Status: DC
  Administered 2012-02-10: 1000 mL via INTRAVENOUS

## 2012-02-10 NOTE — ED Notes (Signed)
Pt reports left arm  And jaw pain that started Friday.

## 2012-02-10 NOTE — Telephone Encounter (Signed)
noted 

## 2012-02-10 NOTE — ED Notes (Signed)
Patient back from  X-ray 

## 2012-02-10 NOTE — Telephone Encounter (Signed)
Please note pt currently in ED

## 2012-02-10 NOTE — Telephone Encounter (Signed)
Caller: /Patient; Patient Name: [name]; PCP: Sheliah Hatch.; Best Callback Phone Number: [phone] Calling about having Pain in L arm, shoulder, both cheeks and jaw and jaw feels tight- on and off for past month. Arm/shoulder feels sore to touch and arm feels "heavy". Pain started today at 1130-02/10/12. She has been nauseous on and off and Diarrhea 2-3 times daily for past month- since started taking Prilosec. Pain usually started after she has been active and exerting herself for 20 min. L side of neck is sore. Triage Per Arm Non-Injury Protocol and 911 advised for "New or worsening arm pain AND any cardiac signs/symptoms lasting for more than 5 minuets now or within last hour." She is refusing to call 911 and will  drive herself to [facility] on [address]

## 2012-02-10 NOTE — ED Notes (Signed)
Patient transported to X-ray 

## 2012-02-10 NOTE — ED Provider Notes (Signed)
History    CSN: [id number] Arrival date & time 02/10/12  1456 First MD Initiated Contact with Patient 02/10/12 1522     CArdiologist: Dr. Jacinto Halim Chief Complaint  Patient presents with  . Arm Pain  . Jaw Pain    HPI Pt has been having pain in her face and left arm.  This has been ongoing for years but she never really thought much of it.   Her bilateral jaw and midface hurts.  The pain is also located in the left shoulder and left axillary region.  When she exerts herself it gets worse.  She walks regularly and will get the pain.  When she rests it seems to get better.  Today the pain has been more constant.  It has waxed and waned somewhat although has been pretty constant for the last few days..  No shortness of breath.  Some nausea.  No vomiting.  She does have history of GERD that was causing shortness of breath.  That has gotten much better in the last few months with antacids.  She has a cardiologist for her a fib but she has not mentioned this issue to them.  She called her PCP today who told her to come to the ED.  Pt does have history of cardiac cath 3 years ago. Past Medical History  Diagnosis Date  . Arthritis   . Hyperlipidemia   . 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 Chronic constipation   . Hemorrhoid   . Varicose veins   . Anxiety   . Depression   . GERD (gastroesophageal reflux disease)   . Atrial fibrillation   . Recurrent UTI   . Parkinson disease   . Cardiac arrhythmia due to congenital heart disease     Past Surgical History  Procedure Date  . Appendectomy   . Ileal resection 03/2007    Due to obstruction    Family History  Problem Relation Age of Onset  . Asthma Brother   . Alcohol abuse Brother   . Throat cancer Father   . Alcohol abuse Father   . Lupus Daughter   . Bipolar disorder Daughter   . Lupus Daughter   . Anxiety disorder Daughter   . Alcohol abuse Sister   . Alcohol abuse Maternal Grandfather   . Alcohol abuse Paternal  Grandmother     History  Substance Use Topics  . Smoking status: Former Smoker -- 2.0 packs/day for 30 years    Types: Cigarettes    Quit date: 04/21/1980  . Smokeless tobacco: Not on file   Comment: onset age 89 -58, up to > 1ppd (almost 2 ppd)  . Alcohol Use: 1.2 oz/week    2 Glasses of wine per week     occasional    OB History    Grav Para Term Preterm Abortions TAB SAB Ect Mult Living                  Review of Systems  Constitutional: Negative for fever.  Gastrointestinal: Positive for diarrhea.  Skin: Negative for rash.  All other systems reviewed and are negative.    Allergies  Adhesive; Amoxicillin; and Ciprofloxacin  Home Medications   Current Outpatient Rx  Name Route Sig Dispense Refill  . ALBUTEROL SULFATE HFA 108 (90 BASE) MCG/ACT IN AERS Inhalation Inhale 2 puffs into the lungs 4 (four) times daily as needed. For shortness of breath    . ASPIRIN 81 MG PO TABS Oral Take 81 mg by mouth  daily.     . BECLOMETHASONE DIPROPIONATE 80 MCG/ACT IN AERS Inhalation Inhale 2 puffs into the lungs 2 (two) times daily. 1 Inhaler 6  . CALCIUM CARBONATE-VITAMIN D 600-400 MG-UNIT PO TABS Oral Take 1 tablet by mouth 2 (two) times daily.      Marland Kitchen CRANBERRY-VITAMIN C-VITAMIN E 6000-100-3 MG-MG-UNIT PO CAPS Oral Take 1 capsule by mouth every morning.      Marland Kitchen DIGOXIN 0.125 MG PO TABS Oral Take 125 mcg by mouth daily.      Marland Kitchen FAMOTIDINE 20 MG PO TABS Oral Take 20 mg by mouth daily as needed. indigestion    . OMEGA-3 FATTY ACIDS 1000 MG PO CAPS Oral Take 1 g by mouth every morning.     Marland Kitchen FLECAINIDE ACETATE 50 MG PO TABS  25 mg 2 (two) times daily.     Marland Kitchen NAPROXEN SODIUM 220 MG PO TABS Oral Take 220 mg by mouth once.    . OMEPRAZOLE 10 MG PO CPDR Oral Take 10 mg by mouth daily.    . SERTRALINE HCL 50 MG PO TABS Oral Take 1.5 tablets (75 mg total) by mouth daily. 135 tablet 0  . TRIMETHOPRIM 100 MG PO TABS Oral Take 100 mg by mouth at bedtime.      BP 136/78  Pulse 65  Temp 97.9 F  (36.6 C) (Oral)  Resp 20  Ht 5\' 11"  (1.803 m)  Wt 158 lb (71.668 kg)  BMI 22.04 kg/m2  SpO2 97%  Physical Exam  ED Course  Procedures (including critical care time) EKG  Rate: 55  Rhythm: sinus bradycardia  QRS Axis: normal  Intervals: normal  ST/T Wave abnormalities: normal  Conduction Disutrbances:none  Narrative Interpretation: No acute ischemic changes noted  Old EKG Reviewed: Unchanged  Labs Reviewed  CBC - Abnormal; Notable for the following:    RBC 3.85 (*)     Hemoglobin 11.9 (*)     HCT 35.8 (*)     All other components within normal limits  COMPREHENSIVE METABOLIC PANEL - Abnormal; Notable for the following:    Total Bilirubin 0.2 (*)     GFR calc non Af Amer 58 (*)     GFR calc Af Amer 67 (*)     All other components within normal limits  PROTIME-INR  APTT  TROPONIN I  TROPONIN I   Dg Chest 2 View  02/10/2012  *RADIOLOGY REPORT*  Clinical Data: Arm and jaw pain.  CHEST - 2 VIEW  Comparison: December 25, 2011.  Findings: Cardiomediastinal silhouette appears normal. Mild dextroscoliosis of thoracic spine is noted. No acute pulmonary disease is noted.  IMPRESSION: No acute cardiopulmonary abnormality seen.   Original Report Authenticated By: Venita Sheffield., M.D.       MDM  I reviewed the patient's old records. According to the records, patient has been having some trouble with pain as well as shortness of breath. Her shortness of breath has been evaluated by [facility] as well as ENT specialists .   She had a cardiopulmonary stress test procedure performed August 29 of this year. That test indicated no EKG changes noted with exercise. She had a submaximal pulmonary response but this was associated with an abnormal breathing pattern. They felt this was most consistent with some type of anxiety disorder. According to the records, patient does have a well-known history of anxiety.  7:52 PM the patient's 2 sets of cardiac enzymes are normal. She has a  normal reassuring EKG and had a stress  test within the last few months that was normal.  The symptoms have been ongoing for an extended period of time. She does have history of anxiety per her medical records. As need be a recurrent episode.  At this time I feel it is reasonable for her to followup closely with her cardiologist, Dr. Jacinto Halim.  The patient call the office tomorrow to arrange for close followup        Celene Kras, MD 02/10/12 [phone]

## 2012-02-11 DIAGNOSIS — R0789 Other chest pain: Secondary | ICD-10-CM | POA: Diagnosis not present

## 2012-02-11 DIAGNOSIS — R0602 Shortness of breath: Secondary | ICD-10-CM | POA: Diagnosis not present

## 2012-02-11 DIAGNOSIS — I4891 Unspecified atrial fibrillation: Secondary | ICD-10-CM | POA: Diagnosis not present

## 2012-02-13 DIAGNOSIS — K219 Gastro-esophageal reflux disease without esophagitis: Secondary | ICD-10-CM | POA: Diagnosis not present

## 2012-02-13 DIAGNOSIS — R0989 Other specified symptoms and signs involving the circulatory and respiratory systems: Secondary | ICD-10-CM | POA: Diagnosis not present

## 2012-02-13 DIAGNOSIS — R0609 Other forms of dyspnea: Secondary | ICD-10-CM | POA: Diagnosis not present

## 2012-02-27 ENCOUNTER — Encounter (HOSPITAL_COMMUNITY): Payer: Self-pay | Admitting: Licensed Clinical Social Worker

## 2012-02-27 ENCOUNTER — Ambulatory Visit (INDEPENDENT_AMBULATORY_CARE_PROVIDER_SITE_OTHER): Payer: Medicare Other | Admitting: Licensed Clinical Social Worker

## 2012-02-27 DIAGNOSIS — F411 Generalized anxiety disorder: Secondary | ICD-10-CM

## 2012-02-27 NOTE — Progress Notes (Signed)
   THERAPIST PROGRESS NOTE  Session Time: 8:30am-9:20am  Participation Level: Active  Behavioral Response: Well GroomedAlertAnxious  Type of Therapy: Individual Therapy  Treatment Goals addressed: Anxiety and Coping  Interventions: CBT, Solution Focused, Strength-based, Supportive and Reframing  Summary: [name] is a 78 y.o. female who presents with anxious mood and affect. She reports increased anxiety and uncertainty about whether or not she should remain in [place] or move to [place]. She was recently in the ED for chest pain and processes her concern that she does not have anyone to rely upon here and she is unwilling to ask her friends for help, because she believes they busy with their own lives. She contemplates moving back to [place], but does not want her children to have to take care of her and does not believe that they would have the time to care for her. She feels jealous of her neighbor and the attention she receives and she is upset that she feels this way. Her sleep and appetite are wnl.  Suicidal/Homicidal: Nowithout intent/plan  Therapist Response: Assessed patients current functioning and reviewed progress. Reviewed coping strategies. Assessed patients safety and assisted in identifying protective factors.  Reviewed crisis plan with patient. Assisted patient with the expression of her feelings of anxiety. Patients thinking is negative and distorted. She needs frequent redirection. Used CBT to assist patient with the identification of negative distortions and irrational thoughts. Encouraged patient to verbalize alternative and factual responses which challenge thought distortions. Reviewed deep breathing to reduce anxiety. Used DBT to practice mindfulness, review distraction list and improve distress tolerance skills. Reviewed patients self care plan. Assessed  progress related to self care. Patient's self care is good. Recommend proper diet, regular exercise, socialization and  recreation.  Plan: Return again in two weeks.  Diagnosis: Axis I: Generalized Anxiety Disorder    Axis II: No diagnosis    Aldena Worm, LCSW 02/27/2012

## 2012-03-01 ENCOUNTER — Ambulatory Visit (INDEPENDENT_AMBULATORY_CARE_PROVIDER_SITE_OTHER): Payer: Medicare Other | Admitting: Family Medicine

## 2012-03-01 VITALS — BP 100/62 | HR 82 | Temp 98.5°F | Wt 156.0 lb

## 2012-03-01 DIAGNOSIS — M25519 Pain in unspecified shoulder: Secondary | ICD-10-CM | POA: Diagnosis not present

## 2012-03-01 DIAGNOSIS — K219 Gastro-esophageal reflux disease without esophagitis: Secondary | ICD-10-CM | POA: Diagnosis not present

## 2012-03-01 DIAGNOSIS — F411 Generalized anxiety disorder: Secondary | ICD-10-CM

## 2012-03-01 DIAGNOSIS — B351 Tinea unguium: Secondary | ICD-10-CM | POA: Diagnosis not present

## 2012-03-01 MED ORDER — PANTOPRAZOLE SODIUM 20 MG PO TBEC: 20.0000 mg | DELAYED_RELEASE_TABLET | Freq: Every day | ORAL | Status: DC

## 2012-03-01 NOTE — Progress Notes (Signed)
  Subjective:    Patient ID: [name], female    DOB: [birth year], 78 y.o.   MRN: [id number]  HPI Anxiety- 'i need advice.  i see a therapist and she listens to me but she can't fix me'.  Pt reports she is sweating more than previous.  'i smell'.  Doesn't wear deodorant due to itching.  ER f/u- had chest pain radiating to neck/face and shoulder.  Had negative enzymes x2 in ER, normal EKG.  Had f/u w/ cards and he believes it is something to do w/ her neck.  Cardiac w/u was negative.  Has not had recurrent pain since ER d/c on 10/22.  Pt reports she has had similar episodes previously but this was more severe.  Nail problem- L great toe nail, reports nail was black.  Has been soaking it in Epson salts.  Nail is thick, yellowed.  No pain.  SOB- pt has seen pulm and ENT.  Breathing improved w/ Prilosec but this gave her diarrhea.  Cards suggested Famotidine but 'this doesn't do a thing'.  Would like med change that will work similarly to Prilosec but not cause GI side effects.   Review of Systems For ROS see HPI     Objective:   Physical Exam  Constitutional: She is oriented to person, place, and time. She appears well-developed and well-nourished. No distress.  HENT:  Head: Normocephalic and atraumatic.  Eyes: Conjunctivae normal and EOM are normal. Pupils are equal, round, and reactive to light.  Neck: Normal range of motion. Neck supple. No thyromegaly present.  Cardiovascular: Normal rate, regular rhythm, normal heart sounds and intact distal pulses.   No murmur heard. Pulmonary/Chest: Effort normal and breath sounds normal. No respiratory distress.  Abdominal: Soft. She exhibits no distension. There is no tenderness.  Musculoskeletal:       Left shoulder: She exhibits decreased range of motion, tenderness, pain and decreased strength. She exhibits no swelling, no effusion, no crepitus and normal pulse.  Lymphadenopathy:    She has no cervical adenopathy.  Neurological: She  is alert and oriented to person, place, and time.  Skin: Skin is warm and dry.       L great toe nail is thickened, yellow, but no blackness seen  Psychiatric:       Very anxious          Assessment & Plan:

## 2012-03-01 NOTE — Patient Instructions (Addendum)
We'll call you with your orthopedic appt for the shoulder Start the Fungi-nail nail treatment for the fungus.  Available over the counter- use as directed Start the Protonix daily for the acid reflux Try Toms Deodorant (it's organic) and available at [employer], [employer], etc Try and relax- you're doing fine! Call with any questions or concerns Hang in there!

## 2012-03-09 DIAGNOSIS — B009 Herpesviral infection, unspecified: Secondary | ICD-10-CM | POA: Diagnosis not present

## 2012-03-09 DIAGNOSIS — Z85828 Personal history of other malignant neoplasm of skin: Secondary | ICD-10-CM | POA: Diagnosis not present

## 2012-03-09 DIAGNOSIS — B351 Tinea unguium: Secondary | ICD-10-CM | POA: Diagnosis not present

## 2012-03-09 DIAGNOSIS — L820 Inflamed seborrheic keratosis: Secondary | ICD-10-CM | POA: Diagnosis not present

## 2012-03-09 DIAGNOSIS — L259 Unspecified contact dermatitis, unspecified cause: Secondary | ICD-10-CM | POA: Diagnosis not present

## 2012-03-14 ENCOUNTER — Encounter: Payer: Self-pay | Admitting: Family Medicine

## 2012-03-14 NOTE — Assessment & Plan Note (Addendum)
Deteriorated.  Pt is in counseling but remains severely anxious.  Also on Zoloft- hesitant to increase dose at this time.  Suspect her anxiety has a lot to do w/ her SOB and chest pains.  Will continue to follow.

## 2012-03-14 NOTE — Assessment & Plan Note (Signed)
New.  Pt's pain is more shoulder than neck.  Refer to ortho given pt's level of pain and mobility issues.  Pt expressed understanding and is in agreement w/ plan.

## 2012-03-14 NOTE — Assessment & Plan Note (Signed)
Unchanged.  No relief w/ pepcid- switch to protonix.

## 2012-03-14 NOTE — Assessment & Plan Note (Signed)
New.  Start OTC fungi-nail.  Will not start oral antifungals due to possibility of liver toxicity.  Pt expressed understanding and is in agreement w/ plan.

## 2012-03-15 ENCOUNTER — Encounter (HOSPITAL_COMMUNITY): Payer: Self-pay | Admitting: Licensed Clinical Social Worker

## 2012-03-15 ENCOUNTER — Ambulatory Visit (INDEPENDENT_AMBULATORY_CARE_PROVIDER_SITE_OTHER): Payer: Medicare Other | Admitting: Licensed Clinical Social Worker

## 2012-03-15 DIAGNOSIS — F411 Generalized anxiety disorder: Secondary | ICD-10-CM | POA: Diagnosis not present

## 2012-03-15 NOTE — Progress Notes (Signed)
   THERAPIST PROGRESS NOTE  Session Time: 11:30am-12:20pm  Participation Level: Active  Behavioral Response: Well GroomedAlertAnxious  Type of Therapy: Individual Therapy  Treatment Goals addressed: Coping  Interventions: CBT, Motivational Interviewing, Strength-based, Supportive and Reframing  Summary: [name] is a 78 y.o. female who presents with anxious mood and affect. She reports feeling overwhelmed with basic tasks and is having more difficulty making decisions. She becomes upset when she receives things in the mail which require her attention, such as the bank or billing statements and she is unable to get in touch with people. She is very anxious that she has reservations for lunch tomorrow at [facility] and called to cancel but was only able to leave a message. She is anxious that she will be responsible financially if they don't receive the message. She endorses some anhedonia with decreased motivation. While she is still socially active, her typical enthusiasm has decreased and she is concerned about this. She is anxious about finances now that Christmas is coming. She has difficulty accepting when others are considerate of her and express love towards her. Her sleep and appetite are wnl.    Suicidal/Homicidal: Nowithout intent/plan  Therapist Response: Assessed patients current functioning and reviewed progress. Reviewed coping strategies. Assessed patients safety and assisted in identifying protective factors.  Reviewed crisis plan with patient. Assisted patient with the expression of her feelings of anxiety and depression. Patients thinking is negative and irrational at times. Used motivational interviewing to assist and encourage patient through the change process. Explored patients barriers to change. Reviewed patients self care plan. Assessed  progress related to self care. Patient's self care is good. Recommend proper diet, regular exercise, socialization and recreation.  Used DBT to practice mindfulness, review distraction list and improve distress tolerance skills. Sent message to Dr. Lolly Mustache about patients request to increase her Zoloft.   Plan: Return again in two  weeks.  Diagnosis: Axis I: Generalized Anxiety Disorder    Axis II: No diagnosis    Sidra Oldfield, LCSW 03/15/2012

## 2012-03-16 ENCOUNTER — Other Ambulatory Visit (HOSPITAL_COMMUNITY): Payer: Self-pay | Admitting: Psychiatry

## 2012-03-16 NOTE — Progress Notes (Signed)
Call returned.  Patient is complaining of increased anxiety and depression.  She wants to try Zoloft 100 mg.  I have been recommending her to increase her Zoloft to take 100 mg.  We'll authorize to take Zoloft 100 mg.  I recommend to call us if she is any question or concern that she should let us know.

## 2012-03-19 ENCOUNTER — Ambulatory Visit (INDEPENDENT_AMBULATORY_CARE_PROVIDER_SITE_OTHER): Payer: Medicare Other | Admitting: Internal Medicine

## 2012-03-19 ENCOUNTER — Telehealth: Payer: Self-pay | Admitting: Family Medicine

## 2012-03-19 VITALS — BP 98/68 | HR 64 | Temp 98.0°F | Wt 157.0 lb

## 2012-03-19 DIAGNOSIS — J449 Chronic obstructive pulmonary disease, unspecified: Secondary | ICD-10-CM | POA: Diagnosis not present

## 2012-03-19 DIAGNOSIS — R109 Unspecified abdominal pain: Secondary | ICD-10-CM | POA: Diagnosis not present

## 2012-03-19 DIAGNOSIS — R079 Chest pain, unspecified: Secondary | ICD-10-CM

## 2012-03-19 LAB — POCT URINALYSIS DIPSTICK
Ketones, UA: NEGATIVE
Protein, UA: NEGATIVE
Spec Grav, UA: <=1.005
pH, UA: 7

## 2012-03-19 MED ORDER — TIOTROPIUM BROMIDE MONOHYDRATE 18 MCG IN CAPS: 18.0000 ug | ORAL_CAPSULE | Freq: Every day | RESPIRATORY_TRACT | Status: DC

## 2012-03-19 NOTE — Patient Instructions (Addendum)
Please call Monday for results of the urine culture  Start Spiriva 1 puff daily, take Mucinex DM as needed for cough. We are scheduling an ultrasound of the gallbladder Please see your primary doctor in 2 weeks.

## 2012-03-19 NOTE — Assessment & Plan Note (Signed)
Patient has daily cough,  trial with Spiriva

## 2012-03-19 NOTE — Telephone Encounter (Signed)
Last OV 03/01/12

## 2012-03-19 NOTE — Progress Notes (Signed)
  Subjective:    Patient ID: [name], female    DOB: 19-Oct-1927, 78 y.o.   MRN: [id number]  HPI Visit 78 year old lady with history of atrial fibrillation on flecainide and Lanoxin, history of COPD, recurrent UTIs, L1 + L4 compression fractures and other problems that presents with right-sided chest-abdominal and flank pain x months. The pain is mostly located at the right lateral rib cage but extends to the right upper quadrant of the abdomen and right flank. It is a steady, no radiation, no change w/ food intake. Is somehow worse with bending her torso.  Past Medical History  Diagnosis Date  . Arthritis   . Hyperlipidemia   . 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 Chronic constipation   . Hemorrhoid   . Varicose veins   . Anxiety   . Depression   . GERD (gastroesophageal reflux disease)   . Atrial fibrillation   . Recurrent UTI   . Parkinson disease   . Cardiac arrhythmia due to congenital heart disease    Past Surgical History  Procedure Date  . Appendectomy   . Ileal resection 03/2007    Due to obstruction   Review of Systems No weight loss noted. Denies dysuria or gross hematuria. Has a chronic issue with constipation/diarrhea on and off. No actual nausea or vomiting. No blood in the stools. She coughs daily, some sputum production, no hemoptysis.     Objective:   Physical Exam  Abdominal:     General -- alert, well-developed, and well-nourished.    Lungs -- normal respiratory effort, no intercostal retractions, no accessory muscle use, and few ronchi B.   Heart-- bradycardic Abdomen--not distended, soft, good bowel sounds, mild generalized discomfort to palpation without mass or rebound. Interestingly, the rib cage and the pelvic crest are also touching each other. No rash. No organomegaly.   Extremities-- no pretibial edema bilaterally Neurologic-- alert & oriented X3 and strength normal in all extremities. Psych-- Cognition and judgment appear  intact. Alert and cooperative with normal attention span and concentration.  not anxious appearing and not depressed appearing.       Assessment & Plan:   Right-sided chest and abdominal pain,  symptoms are going on for months, exam show diffuse mild abdominal tenderness without mass or rebound. No organomegaly. Symptoms have some mechanical features, could be related to chronic cough, could be related to the fact that the rib cage is so close to the iliac crest. CXR 01-2012 (-) Plan: Udip shows some infection, check a urine culture , Rx if appropriate , she will be out of town next week, asket to call us for results  Abdominal ultrasound Try to get better control of cough, add spiriva

## 2012-03-19 NOTE — Telephone Encounter (Signed)
Error

## 2012-03-19 NOTE — Telephone Encounter (Signed)
refill  Trimethoprim (Tab) 100 MG Take 100 mg by mouth at bedtime. #30 last fill 10.29.13, last ov 11.1113 Hospt.F/U

## 2012-03-20 ENCOUNTER — Ambulatory Visit (HOSPITAL_BASED_OUTPATIENT_CLINIC_OR_DEPARTMENT_OTHER)
Admission: RE | Admit: 2012-03-20 | Discharge: 2012-03-20 | Disposition: A | Discharge status: Home / Self Care | Payer: Medicare Other | Source: Ambulatory Visit | Attending: Internal Medicine | Admitting: Internal Medicine

## 2012-03-20 DIAGNOSIS — R1011 Right upper quadrant pain: Secondary | ICD-10-CM | POA: Diagnosis not present

## 2012-03-20 DIAGNOSIS — R079 Chest pain, unspecified: Secondary | ICD-10-CM | POA: Insufficient documentation

## 2012-03-20 DIAGNOSIS — R109 Unspecified abdominal pain: Secondary | ICD-10-CM

## 2012-03-20 LAB — URINALYSIS, MICROSCOPIC ONLY
Casts: NONE SEEN
Squamous Epithelial / LPF: NONE SEEN

## 2012-03-21 ENCOUNTER — Encounter: Payer: Self-pay | Admitting: Internal Medicine

## 2012-03-21 LAB — URINE CULTURE: Colony Count: 25000

## 2012-03-22 ENCOUNTER — Encounter (HOSPITAL_COMMUNITY): Payer: Self-pay | Admitting: *Deleted

## 2012-03-22 ENCOUNTER — Telehealth: Payer: Self-pay

## 2012-03-22 ENCOUNTER — Emergency Department (HOSPITAL_COMMUNITY)
Admission: EM | Admit: 2012-03-22 | Discharge: 2012-03-22 | Disposition: A | Discharge status: Home / Self Care | Payer: Medicare Other | Attending: Emergency Medicine | Admitting: Emergency Medicine

## 2012-03-22 ENCOUNTER — Emergency Department (HOSPITAL_COMMUNITY): Payer: Medicare Other

## 2012-03-22 DIAGNOSIS — M129 Arthropathy, unspecified: Secondary | ICD-10-CM | POA: Insufficient documentation

## 2012-03-22 DIAGNOSIS — F3289 Other specified depressive episodes: Secondary | ICD-10-CM | POA: Insufficient documentation

## 2012-03-22 DIAGNOSIS — E871 Hypo-osmolality and hyponatremia: Secondary | ICD-10-CM

## 2012-03-22 DIAGNOSIS — K219 Gastro-esophageal reflux disease without esophagitis: Secondary | ICD-10-CM | POA: Insufficient documentation

## 2012-03-22 DIAGNOSIS — I8390 Asymptomatic varicose veins of unspecified lower extremity: Secondary | ICD-10-CM | POA: Insufficient documentation

## 2012-03-22 DIAGNOSIS — F329 Major depressive disorder, single episode, unspecified: Secondary | ICD-10-CM | POA: Insufficient documentation

## 2012-03-22 DIAGNOSIS — R109 Unspecified abdominal pain: Secondary | ICD-10-CM

## 2012-03-22 DIAGNOSIS — Z79899 Other long term (current) drug therapy: Secondary | ICD-10-CM | POA: Insufficient documentation

## 2012-03-22 DIAGNOSIS — F411 Generalized anxiety disorder: Secondary | ICD-10-CM | POA: Diagnosis not present

## 2012-03-22 DIAGNOSIS — R197 Diarrhea, unspecified: Secondary | ICD-10-CM | POA: Insufficient documentation

## 2012-03-22 DIAGNOSIS — R079 Chest pain, unspecified: Secondary | ICD-10-CM | POA: Diagnosis not present

## 2012-03-22 DIAGNOSIS — G20 Parkinson's disease: Secondary | ICD-10-CM | POA: Insufficient documentation

## 2012-03-22 DIAGNOSIS — F341 Dysthymic disorder: Secondary | ICD-10-CM | POA: Insufficient documentation

## 2012-03-22 DIAGNOSIS — Z7982 Long term (current) use of aspirin: Secondary | ICD-10-CM | POA: Insufficient documentation

## 2012-03-22 DIAGNOSIS — N390 Urinary tract infection, site not specified: Secondary | ICD-10-CM | POA: Diagnosis not present

## 2012-03-22 DIAGNOSIS — I4891 Unspecified atrial fibrillation: Secondary | ICD-10-CM | POA: Insufficient documentation

## 2012-03-22 DIAGNOSIS — K649 Unspecified hemorrhoids: Secondary | ICD-10-CM | POA: Diagnosis not present

## 2012-03-22 DIAGNOSIS — K560 Paralytic ileus: Secondary | ICD-10-CM | POA: Diagnosis not present

## 2012-03-22 DIAGNOSIS — K5900 Constipation, unspecified: Secondary | ICD-10-CM | POA: Insufficient documentation

## 2012-03-22 DIAGNOSIS — Q289 Congenital malformation of circulatory system, unspecified: Secondary | ICD-10-CM | POA: Insufficient documentation

## 2012-03-22 DIAGNOSIS — E785 Hyperlipidemia, unspecified: Secondary | ICD-10-CM | POA: Insufficient documentation

## 2012-03-22 DIAGNOSIS — Z85038 Personal history of other malignant neoplasm of large intestine: Secondary | ICD-10-CM | POA: Diagnosis not present

## 2012-03-22 DIAGNOSIS — I499 Cardiac arrhythmia, unspecified: Secondary | ICD-10-CM | POA: Insufficient documentation

## 2012-03-22 DIAGNOSIS — G20A1 Parkinson's disease without dyskinesia, without mention of fluctuations: Secondary | ICD-10-CM | POA: Insufficient documentation

## 2012-03-22 DIAGNOSIS — K567 Ileus, unspecified: Secondary | ICD-10-CM

## 2012-03-22 LAB — CBC WITH DIFFERENTIAL/PLATELET
Basophils Absolute: 0 10*3/uL (ref 0.0–0.1)
Eosinophils Absolute: 0.1 10*3/uL (ref 0.0–0.7)
Eosinophils Relative: 2 % (ref 0–5)
MCH: 30.4 pg (ref 26.0–34.0)
MCV: 93.7 fL (ref 78.0–100.0)
Platelets: 241 10*3/uL (ref 150–400)
RDW: 13.6 % (ref 11.5–15.5)
WBC: 7.1 10*3/uL (ref 4.0–10.5)

## 2012-03-22 LAB — URINALYSIS, ROUTINE W REFLEX MICROSCOPIC
Bilirubin Urine: NEGATIVE
Ketones, ur: NEGATIVE mg/dL
Nitrite: NEGATIVE
Specific Gravity, Urine: 1.011 (ref 1.005–1.030)
Urobilinogen, UA: 0.2 mg/dL (ref 0.0–1.0)

## 2012-03-22 LAB — COMPREHENSIVE METABOLIC PANEL
ALT: 11 U/L (ref 0–35)
AST: 21 U/L (ref 0–37)
Albumin: 3.7 g/dL (ref 3.5–5.2)
Calcium: 9.6 mg/dL (ref 8.4–10.5)
GFR calc Af Amer: 89 mL/min — ABNORMAL LOW (ref >90)
Sodium: 130 mEq/L — ABNORMAL LOW (ref 135–145)
Total Protein: 6.7 g/dL (ref 6.0–8.3)

## 2012-03-22 MED ORDER — SODIUM CHLORIDE 0.9 % IV SOLN
INTRAVENOUS | Status: DC
  Administered 2012-03-22: 1000 mL via INTRAVENOUS

## 2012-03-22 MED ORDER — TRAMADOL HCL 50 MG PO TABS: 50.0000 mg | ORAL_TABLET | Freq: Four times a day (QID) | ORAL | Status: DC | PRN

## 2012-03-22 MED ORDER — KETOROLAC TROMETHAMINE 30 MG/ML IJ SOLN
30.0000 mg | Freq: Once | INTRAMUSCULAR | Status: AC
  Administered 2012-03-22: 30 mg via INTRAVENOUS
  Filled 2012-03-22: qty 1

## 2012-03-22 MED ORDER — POLYETHYLENE GLYCOL 3350 17 GM/SCOOP PO POWD: 17.0000 g | Freq: Every day | ORAL | Status: AC

## 2012-03-22 NOTE — Telephone Encounter (Signed)
Please advise 

## 2012-03-22 NOTE — ED Notes (Signed)
MD at bedside. 

## 2012-03-22 NOTE — Telephone Encounter (Signed)
Pt called states she is still having a significant amount of pain and is unable to even walk. I advised that pt if her pain is that intense, she may need to be evaluated in the ER. Pt states that she is getting dressed and having a friend take her now. SGJ, RN

## 2012-03-22 NOTE — Telephone Encounter (Signed)
Pt LMOVM stating in Friday to see Drue Novel did urine test and went for sonogram of gall bladder wanting results. Not showing they have been resulted yet. Plz advise pt CB: [phone] MW

## 2012-03-22 NOTE — ED Provider Notes (Signed)
History     CSN: [id number]  Arrival date & time 03/22/12  1221   First MD Initiated Contact with Patient 03/22/12 1538      Chief Complaint  Patient presents with  . Flank Pain    right    (Consider location/radiation/quality/duration/timing/severity/associated sxs/prior treatment) Patient is a 78 y.o. female presenting with flank pain. The history is provided by the patient.  Flank Pain This is a new problem. Pertinent negatives include no chest pain.   patient's had right flank pain for the last week. No dysuria. No fevers. She was seen by her primary care doctor Friday and had an ultrasound and urine both were reassuring. Patient states the pain is constant and is worse with movement. It is not associated with eating. States she's had chronic constipation diarrhea. No chest pain. No cough. She states her trouble breathing has gotten a little worse also. No fevers.  Past Medical History  Diagnosis Date  . Arthritis   . Hyperlipidemia   . 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 Chronic constipation   . Hemorrhoid   . Varicose veins   . Anxiety   . Depression   . GERD (gastroesophageal reflux disease)   . Atrial fibrillation   . Recurrent UTI   . Parkinson disease   . Cardiac arrhythmia due to congenital heart disease     Past Surgical History  Procedure Date  . Appendectomy   . Ileal resection 03/2007    Due to obstruction    Family History  Problem Relation Age of Onset  . Asthma Brother   . Alcohol abuse Brother   . Throat cancer Father   . Alcohol abuse Father   . Lupus Daughter   . Bipolar disorder Daughter   . Lupus Daughter   . Anxiety disorder Daughter   . Alcohol abuse Sister   . Alcohol abuse Maternal Grandfather   . Alcohol abuse Paternal Grandmother     History  Substance Use Topics  . Smoking status: Former Smoker -- 2.0 packs/day for 30 years    Types: Cigarettes    Quit date: 04/21/1980  . Smokeless tobacco: Not on file     Comment:  onset age 42 -56, up to > 1ppd (almost 2 ppd)  . Alcohol Use: 1.2 oz/week    2 Glasses of wine per week     Comment: occasional    OB History    Grav Para Term Preterm Abortions TAB SAB Ect Mult Living                  Review of Systems  Constitutional: Negative for appetite change and fatigue.  Respiratory: Negative for cough and choking.   Cardiovascular: Negative for chest pain.  Genitourinary: Positive for flank pain. Negative for pelvic pain.  Musculoskeletal: Negative for back pain.  Neurological: Negative for light-headedness.  Psychiatric/Behavioral: Negative for behavioral problems.    Allergies  Adhesive; Amoxicillin; and Ciprofloxacin  Home Medications   Current Outpatient Rx  Name  Route  Sig  Dispense  Refill  . ALBUTEROL SULFATE HFA 108 (90 BASE) MCG/ACT IN AERS   Inhalation   Inhale 2 puffs into the lungs 4 (four) times daily as needed. For shortness of breath         . ASPIRIN 81 MG PO TABS   Oral   Take 81 mg by mouth daily.          . BECLOMETHASONE DIPROPIONATE 80 MCG/ACT IN AERS   Inhalation  Inhale 2 puffs into the lungs 2 (two) times daily.   1 Inhaler   6   . CALCIUM CARBONATE-VITAMIN D 600-400 MG-UNIT PO TABS   Oral   Take 1 tablet by mouth 2 (two) times daily.           Marland Kitchen CRANBERRY-VITAMIN C-VITAMIN E 6000-100-3 MG-MG-UNIT PO CAPS   Oral   Take 1 capsule by mouth every morning.           Marland Kitchen DIGOXIN 0.125 MG PO TABS   Oral   Take 125 mcg by mouth daily.           . OMEGA-3 FATTY ACIDS 1000 MG PO CAPS   Oral   Take 1 g by mouth every morning.          Marland Kitchen FLECAINIDE ACETATE 50 MG PO TABS      25 mg 2 (two) times daily.          Marland Kitchen NAPROXEN SODIUM 220 MG PO TABS   Oral   Take 220 mg by mouth once.         Marland Kitchen PANTOPRAZOLE SODIUM 20 MG PO TBEC   Oral   Take 1 tablet (20 mg total) by mouth daily.   30 tablet   6   . SERTRALINE HCL 50 MG PO TABS   Oral   Take 50 mg by mouth daily.         Marland Kitchen TIOTROPIUM BROMIDE  MONOHYDRATE 18 MCG IN CAPS   Inhalation   Place 1 capsule (18 mcg total) into inhaler and inhale daily.   30 capsule   12   . TRIMETHOPRIM 100 MG PO TABS   Oral   Take 100 mg by mouth at bedtime.         Marland Kitchen POLYETHYLENE GLYCOL 3350 PO POWD   Oral   Take 17 g by mouth daily.   255 g   0   . TRAMADOL HCL 50 MG PO TABS   Oral   Take 1 tablet (50 mg total) by mouth every 6 (six) hours as needed for pain.   15 tablet   0     BP 115/61  Pulse 60  Temp 98.3 F (36.8 C) (Oral)  Resp 18  SpO2 97%  Physical Exam  Constitutional: She appears well-developed and well-nourished.  HENT:  Head: Normocephalic.  Eyes: Pupils are equal, round, and reactive to light.  Cardiovascular: Normal rate and regular rhythm.   Pulmonary/Chest: Effort normal.  Abdominal: Soft. There is no tenderness.  Genitourinary:       No CVA tenderness. No rash.  Neurological: She is alert.    ED Course  Procedures (including critical care time)  Labs Reviewed  URINALYSIS, ROUTINE W REFLEX MICROSCOPIC - Abnormal; Notable for the following:    Leukocytes, UA SMALL (*)     All other components within normal limits  CBC WITH DIFFERENTIAL - Abnormal; Notable for the following:    RBC 3.78 (*)     Hemoglobin 11.5 (*)     HCT 35.4 (*)     All other components within normal limits  COMPREHENSIVE METABOLIC PANEL - Abnormal; Notable for the following:    Sodium 130 (*)     Chloride 92 (*)     Total Bilirubin 0.2 (*)     GFR calc non Af Amer 77 (*)     GFR calc Af Amer 89 (*)     All other components within normal limits  URINE MICROSCOPIC-ADD ON  LACTIC ACID, PLASMA  URINE CULTURE   Dg Chest 2 View  03/22/2012  *RADIOLOGY REPORT*  Clinical Data: Right lower chest pain  CHEST - 2 VIEW  Comparison: 02/10/2012  Findings: Cardiomediastinal silhouette is stable.  Again noted dextroscoliosis of thoracic spine.  No acute infiltrate or pulmonary edema.  Mild hyperinflation again noted .  IMPRESSION: .  No  active disease.  Stable thoracic dextroscoliosis.   Original Report Authenticated By: Natasha Mead, M.D.    Dg Abd 2 Views  03/22/2012  *RADIOLOGY REPORT*  Clinical Data: Right flank and lower chest pain.  Nausea and shortness of breath.  Ex-smoker.  ABDOMEN - 2 VIEW  Comparison: Previous CT examinations.  Findings: Gaseous distention of the transverse colon and flexures, similar to the previous CT scout image dated 03/27/2010.  Mildly prominent stool in the left colon and rectosigmoid colon.  No free peritoneal air.  Lumbar spine degenerative changes and mild scoliosis.  Mild bilateral hip degenerative changes.  Small amount of linear atelectasis or scarring at the left lung base.  Mild changes of COPD at the lung bases.  IMPRESSION:  1.  Mild colonic ileus and mildly prominent stool. 2.  Mild changes of COPD.   Original Report Authenticated By: Beckie Salts, M.D.      1. Flank pain   2. Ileus   3. Hyponatremia       MDM  Patient with flank and abdominal pain. Mild hyponatremia along with ileus on x-ray. She has some lateral tenderness. I doubt this is cardiac cause. Urinalysis does not show infection, however culture was sent. She's tolerated orals here will be discharged home to followup.        Juliet Rude. Rubin Payor, MD 03/23/12 1191

## 2012-03-22 NOTE — ED Notes (Signed)
Pt states has had R sided flank pain since last Monday, states went to PCP and gave urine sample and had [place], states she was told gallbladder looked fine but urine culture was still pending, called urologist but did not hear back and was told by nurse to come here, pt states has a hx of UTI's and states it feels like a UTI, having burning and frequency w/ urination, pt states sometimes the stream is slow to come out.

## 2012-03-23 ENCOUNTER — Telehealth: Payer: Self-pay

## 2012-03-23 NOTE — Telephone Encounter (Signed)
Pt LMOVM stating went to [facility] yesterday and  Was Dx- ileus and hyponatremia. Pt states has appt 04/05/12 but needs an appt ASAP. After reviewing pt chart showing 04/05/12 appt canceled and pt scheduled 03/24/12 at 2pm with MD Tabori. I called pt to make sure she knew of the appt. Time. Pt stated she knows of the appt and will be here if she isn't in hospital. Pt states all she wants to do is sleep. I advised pt to keep [place] posted if she ends up in hospital and at appt tomorrow discuss concerns with Tabori.  Pt stated understanding.    MW

## 2012-03-24 ENCOUNTER — Emergency Department (HOSPITAL_COMMUNITY)
Admission: EM | Admit: 2012-03-24 | Discharge: 2012-03-24 | Disposition: A | Discharge status: Home / Self Care | Payer: Medicare Other | Attending: Emergency Medicine | Admitting: Emergency Medicine

## 2012-03-24 ENCOUNTER — Ambulatory Visit: Payer: Self-pay | Admitting: Family Medicine

## 2012-03-24 ENCOUNTER — Emergency Department (HOSPITAL_COMMUNITY): Payer: Medicare Other

## 2012-03-24 DIAGNOSIS — Z8739 Personal history of other diseases of the musculoskeletal system and connective tissue: Secondary | ICD-10-CM | POA: Insufficient documentation

## 2012-03-24 DIAGNOSIS — M549 Dorsalgia, unspecified: Secondary | ICD-10-CM | POA: Insufficient documentation

## 2012-03-24 DIAGNOSIS — F329 Major depressive disorder, single episode, unspecified: Secondary | ICD-10-CM | POA: Insufficient documentation

## 2012-03-24 DIAGNOSIS — R109 Unspecified abdominal pain: Secondary | ICD-10-CM | POA: Diagnosis not present

## 2012-03-24 DIAGNOSIS — S335XXA Sprain of ligaments of lumbar spine, initial encounter: Secondary | ICD-10-CM | POA: Diagnosis not present

## 2012-03-24 DIAGNOSIS — G20A1 Parkinson's disease without dyskinesia, without mention of fluctuations: Secondary | ICD-10-CM | POA: Insufficient documentation

## 2012-03-24 DIAGNOSIS — Y939 Activity, unspecified: Secondary | ICD-10-CM | POA: Insufficient documentation

## 2012-03-24 DIAGNOSIS — H539 Unspecified visual disturbance: Secondary | ICD-10-CM | POA: Insufficient documentation

## 2012-03-24 DIAGNOSIS — J449 Chronic obstructive pulmonary disease, unspecified: Secondary | ICD-10-CM | POA: Diagnosis not present

## 2012-03-24 DIAGNOSIS — X58XXXA Exposure to other specified factors, initial encounter: Secondary | ICD-10-CM | POA: Insufficient documentation

## 2012-03-24 DIAGNOSIS — K5900 Constipation, unspecified: Secondary | ICD-10-CM | POA: Diagnosis not present

## 2012-03-24 DIAGNOSIS — F411 Generalized anxiety disorder: Secondary | ICD-10-CM | POA: Diagnosis not present

## 2012-03-24 DIAGNOSIS — Z8719 Personal history of other diseases of the digestive system: Secondary | ICD-10-CM | POA: Diagnosis not present

## 2012-03-24 DIAGNOSIS — R404 Transient alteration of awareness: Secondary | ICD-10-CM | POA: Diagnosis not present

## 2012-03-24 DIAGNOSIS — Z8774 Personal history of (corrected) congenital malformations of heart and circulatory system: Secondary | ICD-10-CM | POA: Insufficient documentation

## 2012-03-24 DIAGNOSIS — R55 Syncope and collapse: Secondary | ICD-10-CM | POA: Diagnosis not present

## 2012-03-24 DIAGNOSIS — T887XXA Unspecified adverse effect of drug or medicament, initial encounter: Secondary | ICD-10-CM

## 2012-03-24 DIAGNOSIS — K219 Gastro-esophageal reflux disease without esophagitis: Secondary | ICD-10-CM | POA: Diagnosis not present

## 2012-03-24 DIAGNOSIS — R110 Nausea: Secondary | ICD-10-CM | POA: Diagnosis not present

## 2012-03-24 DIAGNOSIS — Z8679 Personal history of other diseases of the circulatory system: Secondary | ICD-10-CM | POA: Diagnosis not present

## 2012-03-24 DIAGNOSIS — G20 Parkinson's disease: Secondary | ICD-10-CM | POA: Diagnosis not present

## 2012-03-24 DIAGNOSIS — Z7982 Long term (current) use of aspirin: Secondary | ICD-10-CM | POA: Diagnosis not present

## 2012-03-24 DIAGNOSIS — Y929 Unspecified place or not applicable: Secondary | ICD-10-CM | POA: Insufficient documentation

## 2012-03-24 DIAGNOSIS — S39012A Strain of muscle, fascia and tendon of lower back, initial encounter: Secondary | ICD-10-CM

## 2012-03-24 DIAGNOSIS — F3289 Other specified depressive episodes: Secondary | ICD-10-CM | POA: Insufficient documentation

## 2012-03-24 DIAGNOSIS — E785 Hyperlipidemia, unspecified: Secondary | ICD-10-CM | POA: Diagnosis not present

## 2012-03-24 DIAGNOSIS — Z87891 Personal history of nicotine dependence: Secondary | ICD-10-CM | POA: Diagnosis not present

## 2012-03-24 DIAGNOSIS — Z79899 Other long term (current) drug therapy: Secondary | ICD-10-CM | POA: Insufficient documentation

## 2012-03-24 DIAGNOSIS — I8390 Asymptomatic varicose veins of unspecified lower extremity: Secondary | ICD-10-CM | POA: Insufficient documentation

## 2012-03-24 DIAGNOSIS — K439 Ventral hernia without obstruction or gangrene: Secondary | ICD-10-CM | POA: Diagnosis not present

## 2012-03-24 DIAGNOSIS — R42 Dizziness and giddiness: Secondary | ICD-10-CM | POA: Insufficient documentation

## 2012-03-24 DIAGNOSIS — J9819 Other pulmonary collapse: Secondary | ICD-10-CM | POA: Diagnosis not present

## 2012-03-24 LAB — COMPREHENSIVE METABOLIC PANEL
Alkaline Phosphatase: 66 U/L (ref 39–117)
BUN: 13 mg/dL (ref 6–23)
Chloride: 102 mEq/L (ref 96–112)
GFR calc Af Amer: 89 mL/min — ABNORMAL LOW (ref >90)
Glucose, Bld: 87 mg/dL (ref 70–99)
Potassium: 4.5 mEq/L (ref 3.5–5.1)
Total Bilirubin: 0.3 mg/dL (ref 0.3–1.2)

## 2012-03-24 LAB — URINALYSIS, ROUTINE W REFLEX MICROSCOPIC
Bilirubin Urine: NEGATIVE
Ketones, ur: NEGATIVE mg/dL
Nitrite: NEGATIVE
Protein, ur: NEGATIVE mg/dL
Urobilinogen, UA: 0.2 mg/dL (ref 0.0–1.0)

## 2012-03-24 LAB — CBC WITH DIFFERENTIAL/PLATELET
Eosinophils Absolute: 0.1 10*3/uL (ref 0.0–0.7)
HCT: 36.7 % (ref 36.0–46.0)
Hemoglobin: 12.3 g/dL (ref 12.0–15.0)
Lymphs Abs: 1.1 10*3/uL (ref 0.7–4.0)
MCH: 31.2 pg (ref 26.0–34.0)
Monocytes Absolute: 0.6 10*3/uL (ref 0.1–1.0)
Monocytes Relative: 6 % (ref 3–12)
Neutro Abs: 7.4 10*3/uL (ref 1.7–7.7)
Neutrophils Relative %: 81 % — ABNORMAL HIGH (ref 43–77)
RBC: 3.94 MIL/uL (ref 3.87–5.11)

## 2012-03-24 LAB — URINE CULTURE

## 2012-03-24 LAB — LIPASE, BLOOD: Lipase: 51 U/L (ref 11–59)

## 2012-03-24 MED ORDER — IBUPROFEN 400 MG PO TABS: 400.0000 mg | ORAL_TABLET | Freq: Four times a day (QID) | ORAL | Status: DC | PRN

## 2012-03-24 MED ORDER — SODIUM CHLORIDE 0.9 % IV BOLUS (SEPSIS)
500.0000 mL | Freq: Once | INTRAVENOUS | Status: AC
  Administered 2012-03-24: 500 mL via INTRAVENOUS

## 2012-03-24 MED ORDER — IOHEXOL 300 MG/ML  SOLN
100.0000 mL | Freq: Once | INTRAMUSCULAR | Status: AC | PRN
  Administered 2012-03-24: 100 mL via INTRAVENOUS

## 2012-03-24 MED ORDER — METHOCARBAMOL 500 MG PO TABS: 500.0000 mg | ORAL_TABLET | Freq: Two times a day (BID) | ORAL | Status: DC

## 2012-03-24 NOTE — ED Notes (Signed)
[id number]<AV> Expected date:<BR> Expected time:<BR> Means of arrival:<BR> Comments:<BR> Leg pain

## 2012-03-24 NOTE — ED Provider Notes (Signed)
History     CSN: [id number]  Arrival date & time 03/24/12  0844   First MD Initiated Contact with Patient 03/24/12 [phone]      Chief Complaint  Patient presents with  . Hypotension  . Dizziness  . Nausea    (Consider location/radiation/quality/duration/timing/severity/associated sxs/prior treatment) HPI Pt was seen yesterday for RLQ/ R flank pain now present for 8 days. Workup showed constipation/mild ileus and nyponatremia. D/c'd home with Tramadol. Pt states pain has been constant and worse with any movement. She took 2 dose of tramadol yesterday and woke this AM with lightheadedness, blurred vision, nausea worse when sitting up. EMS arrived to find SBP in 70's. Pt has now had IVF's and states dizziness and blurred vision improved. Continues to have abd/flank pain. No BM for several days though she continues to have flatus.  Past Medical History  Diagnosis Date  . Arthritis   . Hyperlipidemia   . 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 Chronic constipation   . Hemorrhoid   . Varicose veins   . Anxiety   . Depression   . GERD (gastroesophageal reflux disease)   . Atrial fibrillation   . Recurrent UTI   . Parkinson disease   . Cardiac arrhythmia due to congenital heart disease     Past Surgical History  Procedure Date  . Appendectomy   . Ileal resection 03/2007    Due to obstruction    Family History  Problem Relation Age of Onset  . Asthma Brother   . Alcohol abuse Brother   . Throat cancer Father   . Alcohol abuse Father   . Lupus Daughter   . Bipolar disorder Daughter   . Lupus Daughter   . Anxiety disorder Daughter   . Alcohol abuse Sister   . Alcohol abuse Maternal Grandfather   . Alcohol abuse Paternal Grandmother     History  Substance Use Topics  . Smoking status: Former Smoker -- 2.0 packs/day for 30 years    Types: Cigarettes    Quit date: 04/21/1980  . Smokeless tobacco: Not on file     Comment: onset age 48 -46, up to > 1ppd (almost 2 ppd)   . Alcohol Use: 1.2 oz/week    2 Glasses of wine per week     Comment: occasional    OB History    Grav Para Term Preterm Abortions TAB SAB Ect Mult Living                  Review of Systems  Constitutional: Negative for fever and chills.  Eyes: Positive for visual disturbance.  Respiratory: Negative for shortness of breath.   Cardiovascular: Negative for chest pain.  Gastrointestinal: Positive for nausea, abdominal pain, constipation and abdominal distention. Negative for vomiting and diarrhea.  Genitourinary: Positive for flank pain. Negative for dysuria, frequency, hematuria and difficulty urinating.  Musculoskeletal: Positive for myalgias and back pain.  Skin: Negative for rash and wound.  Neurological: Positive for dizziness and light-headedness. Negative for syncope, numbness and headaches.    Allergies  Adhesive; Amoxicillin; and Ciprofloxacin  Home Medications   Current Outpatient Rx  Name  Route  Sig  Dispense  Refill  . ALBUTEROL SULFATE HFA 108 (90 BASE) MCG/ACT IN AERS   Inhalation   Inhale 2 puffs into the lungs 4 (four) times daily as needed. For shortness of breath         . ASPIRIN 81 MG PO TABS   Oral   Take 81 mg by  mouth daily.          . BECLOMETHASONE DIPROPIONATE 80 MCG/ACT IN AERS   Inhalation   Inhale 2 puffs into the lungs 2 (two) times daily.   1 Inhaler   6   . CALCIUM CARBONATE-VITAMIN D 600-400 MG-UNIT PO TABS   Oral   Take 1 tablet by mouth 2 (two) times daily.           Marland Kitchen CRANBERRY-VITAMIN C-VITAMIN E 6000-100-3 MG-MG-UNIT PO CAPS   Oral   Take 1 capsule by mouth every morning.           Marland Kitchen DIGOXIN 0.125 MG PO TABS   Oral   Take 125 mcg by mouth daily.           . OMEGA-3 FATTY ACIDS 1000 MG PO CAPS   Oral   Take 1 g by mouth every morning.          Marland Kitchen FLECAINIDE ACETATE 50 MG PO TABS      25 mg 2 (two) times daily.          . ADULT MULTIVITAMIN W/MINERALS CH   Oral   Take 1 tablet by mouth daily.          Marland Kitchen PANTOPRAZOLE SODIUM 20 MG PO TBEC   Oral   Take 1 tablet (20 mg total) by mouth daily.   30 tablet   6   . POLYETHYLENE GLYCOL 3350 PO POWD   Oral   Take 17 g by mouth daily.   255 g   0   . SERTRALINE HCL 50 MG PO TABS   Oral   Take 50 mg by mouth daily.         Marland Kitchen TRIMETHOPRIM 100 MG PO TABS   Oral   Take 100 mg by mouth at bedtime.         . IBUPROFEN 400 MG PO TABS   Oral   Take 1 tablet (400 mg total) by mouth every 6 (six) hours as needed for pain.   30 tablet   0   . METHOCARBAMOL 500 MG PO TABS   Oral   Take 1 tablet (500 mg total) by mouth 2 (two) times daily.   20 tablet   0     BP 118/69  Pulse 61  Temp 98.7 F (37.1 C) (Oral)  Resp 16  SpO2 100%  Physical Exam  Nursing note and vitals reviewed. Constitutional: She is oriented to person, place, and time. She appears well-developed and well-nourished. No distress.  HENT:  Head: Normocephalic and atraumatic.  Mouth/Throat: Oropharynx is clear and moist.  Eyes: EOM are normal. Pupils are equal, round, and reactive to light.       bl pinpoint pupils  Neck: Normal range of motion. Neck supple.  Cardiovascular: Normal rate and regular rhythm.   Pulmonary/Chest: Effort normal and breath sounds normal. No respiratory distress. She has no wheezes. She has no rales.  Abdominal: Soft. Bowel sounds are normal. She exhibits no distension and no mass. There is tenderness (Tenderness to deep palpation RLQ. no rebound or guarding. TTP R Lumbar paraspinal and R flank. No rash or trauma). There is no rebound and no guarding.  Musculoskeletal: Normal range of motion. She exhibits no edema and no tenderness.  Neurological: She is alert and oriented to person, place, and time.       5/5 motor in all ext, sensation intact  Skin: Skin is warm and dry. No rash noted. No erythema.  Psychiatric:  She has a normal mood and affect. Her behavior is normal.    ED Course  Procedures (including critical care  time)  Labs Reviewed  CBC WITH DIFFERENTIAL - Abnormal; Notable for the following:    Neutrophils Relative 81 (*)     All other components within normal limits  COMPREHENSIVE METABOLIC PANEL - Abnormal; Notable for the following:    Albumin 3.3 (*)     GFR calc non Af Amer 77 (*)     GFR calc Af Amer 89 (*)     All other components within normal limits  DIGOXIN LEVEL - Abnormal; Notable for the following:    Digoxin Level 0.5 (*)     All other components within normal limits  LIPASE, BLOOD  TROPONIN I  URINALYSIS, ROUTINE W REFLEX MICROSCOPIC   Dg Chest 2 View  03/22/2012  *RADIOLOGY REPORT*  Clinical Data: Right lower chest pain  CHEST - 2 VIEW  Comparison: 02/10/2012  Findings: Cardiomediastinal silhouette is stable.  Again noted dextroscoliosis of thoracic spine.  No acute infiltrate or pulmonary edema.  Mild hyperinflation again noted .  IMPRESSION: .  No active disease.  Stable thoracic dextroscoliosis.   Original Report Authenticated By: Natasha Mead, M.D.    Ct Abdomen Pelvis W Contrast  03/24/2012  *RADIOLOGY REPORT*  Clinical Data: Right side abdominal and flank pain, nausea, past history of small bowel obstruction with resection, appendectomy, Parkinson's  CT ABDOMEN AND PELVIS WITH CONTRAST  Technique:  Multidetector CT imaging of the abdomen and pelvis was performed following the standard protocol during bolus administration of intravenous contrast.  Contrast: OMNIPAQUE IOHEXOL 300 MG/ML  SOLN Dilute oral contrast.  Comparison: 03/27/2010  Findings: Bibasilar atelectasis. Pelvic left kidney. Gallbladder surgically absent with minimal intrahepatic biliary dilatation, stable. No additional abnormalities of liver, spleen, pancreas, kidneys, or adrenal glands. Supraumbilical midline ventral hernia containing transverse colon without evidence of bowel obstruction. Stomach unopacified and collapsed, unable to adequately assess gastric wall thickness. Increased stool in rectum.  Large  and small bowel loops otherwise normal appearance. Small amount of nonspecific low attenuation free pelvic fluid. Normal-appearing bladder, uterus, and adnexae. No mass, adenopathy, or inflammatory process. Scattered atherosclerotic calcifications aorta. Mildly increased superior endplate compression deformity of L1. Multilevel degenerative disc disease changes lumbar spine with disc space narrowing, endplate spur formation/sclerosis and vacuum phenomena. Bilateral hip joint osteoarthritic changes.  IMPRESSION: Pelvic left kidney. Supraumbilical midline ventral hernia containing a segment of nonobstructed transverse colon. Small amount of nonspecific free pelvic fluid.   Original Report Authenticated By: Ulyses Southward, M.D.    Dg Chest Port 1 View  03/24/2012  *RADIOLOGY REPORT*  Clinical Data: Left lower anterior chest pain, wheezing, shortness of breath, hypotensive  PORTABLE CHEST - 1 VIEW  Comparison: Portable exam 0948 hours compared to 03/22/2012  Findings: None Normal heart size and pulmonary vascularity. Calcified tortuous aorta. Emphysematous changes with minimal bibasilar atelectasis. No pulmonary infiltrate, pleural effusion, or pneumothorax. Dextroconvex thoracic scoliosis.  IMPRESSION: COPD with minimal bibasilar atelectasis.   Original Report Authenticated By: Ulyses Southward, M.D.    Dg Abd 2 Views  03/22/2012  *RADIOLOGY REPORT*  Clinical Data: Right flank and lower chest pain.  Nausea and shortness of breath.  Ex-smoker.  ABDOMEN - 2 VIEW  Comparison: Previous CT examinations.  Findings: Gaseous distention of the transverse colon and flexures, similar to the previous CT scout image dated 03/27/2010.  Mildly prominent stool in the left colon and rectosigmoid colon.  No free peritoneal air.  Lumbar spine  degenerative changes and mild scoliosis.  Mild bilateral hip degenerative changes.  Small amount of linear atelectasis or scarring at the left lung base.  Mild changes of COPD at the lung bases.   IMPRESSION:  1.  Mild colonic ileus and mildly prominent stool. 2.  Mild changes of COPD.   Original Report Authenticated By: Beckie Salts, M.D.      1. Lumbar strain   2. Medication side effect   3. Near syncope   4. Constipation       Date: 03/24/2012  Rate: 60  Rhythm: normal sinus rhythm  QRS Axis: normal  Intervals: normal  ST/T Wave abnormalities: normal  Conduction Disutrbances:none  Narrative Interpretation:   Old EKG Reviewed: unchanged   MDM   Pt is well appearing. VS stable in ED. Suspect drop in BP due to combination of ultram with current medications. CT with constipation. Do not believe pt's symptoms are due to this given TTP of lumbar paraspinal muscle and nature of pain that is worse with movement. Suspect lumbar strain. Will give ibuprofen and robaxin and have f/u with PMD for possible PT referral if symptoms continues. Given strict return to ED precautions. Pt voices understanding       Loren Racer, MD 03/24/12 1314

## 2012-03-24 NOTE — ED Notes (Signed)
Per EMS: Pt was here on Monday with flank pain.  Was dx w/ hyponatremia, ileus, and muscle strain.  Since then has had diarrhea and constipation intermittently.  Was prescribed tramadol.  Woke up this morning with dizziness, nausea, and blurred vision.  Upon EMS arrival, pt's BP was 77 palp.  Pt has hx of afib.

## 2012-03-29 ENCOUNTER — Ambulatory Visit (INDEPENDENT_AMBULATORY_CARE_PROVIDER_SITE_OTHER): Payer: Medicare Other | Admitting: Licensed Clinical Social Worker

## 2012-03-29 ENCOUNTER — Encounter (HOSPITAL_COMMUNITY): Payer: Self-pay | Admitting: Licensed Clinical Social Worker

## 2012-03-29 DIAGNOSIS — F411 Generalized anxiety disorder: Secondary | ICD-10-CM

## 2012-03-29 NOTE — Progress Notes (Signed)
   THERAPIST PROGRESS NOTE  Session Time: 8:30am-9:20am  Participation Level: Active  Behavioral Response: Well GroomedAlertAnxious and Irritable  Type of Therapy: Individual Therapy  Treatment Goals addressed: Coping  Interventions: CBT, Solution Focused, Strength-based, Supportive and Reframing  Summary: [name] is a 78 y.o. female who presents with anxious mood and agitated affect. She has been to the emergency room twice for flank pain and A Fib. She is upset over her diagnosis that she strained her back and believes that something else is wrong. She does not trust her doctors and wants to find a new PCP, but is having trouble finding someone who takes Medicare. She is highly anxious about getting a form signed so she can get her money back about a trip which she will be unable to go on due to pain. She feels agitated, easily confused and has difficulty solving problems. She is anxious about money, traveling to see her children for Christmas and her overall health. She is highly resistant to take a higher dose of Zoloft to help with anxiety. Her sleep and appetite are wnl.    Suicidal/Homicidal: Nowithout intent/plan  Therapist Response: Assessed patients current functioning and reviewed progress. Reviewed coping strategies. Assessed patients safety and assisted in identifying protective factors.  Reviewed crisis plan with patient. Assisted patient with the expression of her feelings of anxiety and agitation. Patients thinking is negative, distorted and irrational at times. She responds well to redirection, but needs frequent redirection. Used CBT to assist patient with the identification of negative distortions and irrational thoughts. Encouraged patient to verbalize alternative and factual responses which challenge thought distortions. She needs help tending to her medical needs and is resistant to this idea. She believes she should be able to take care of herself and if she  can't she believes she is weak. Used DBT to practice mindfulness, review distraction list and improve distress tolerance skills. Used motivational interviewing to assist and encourage patient through the change process. Explored patients barriers to change. Reviewed patients self care plan. Assessed  progress related to self care. Patient's self care is good. Recommend proper diet, regular exercise, socialization and recreation.  Plan: Return again in two weeks.  Diagnosis: Axis I: Generalized Anxiety Disorder    Axis II: No diagnosis    Solace Manwarren, LCSW 03/29/2012

## 2012-04-02 ENCOUNTER — Ambulatory Visit (INDEPENDENT_AMBULATORY_CARE_PROVIDER_SITE_OTHER): Payer: Medicare Other | Admitting: Family Medicine

## 2012-04-02 ENCOUNTER — Encounter: Payer: Self-pay | Admitting: Family Medicine

## 2012-04-02 VITALS — BP 130/82 | HR 62 | Wt 157.0 lb

## 2012-04-02 DIAGNOSIS — M25551 Pain in right hip: Secondary | ICD-10-CM

## 2012-04-02 DIAGNOSIS — S32009A Unspecified fracture of unspecified lumbar vertebra, initial encounter for closed fracture: Secondary | ICD-10-CM

## 2012-04-02 DIAGNOSIS — M25559 Pain in unspecified hip: Secondary | ICD-10-CM | POA: Diagnosis not present

## 2012-04-02 DIAGNOSIS — K5900 Constipation, unspecified: Secondary | ICD-10-CM | POA: Diagnosis not present

## 2012-04-02 DIAGNOSIS — K589 Irritable bowel syndrome without diarrhea: Secondary | ICD-10-CM | POA: Diagnosis not present

## 2012-04-02 MED ORDER — MELOXICAM 7.5 MG PO TABS: 7.5000 mg | ORAL_TABLET | Freq: Every day | ORAL | Status: DC

## 2012-04-02 MED ORDER — LINACLOTIDE 290 MCG PO CAPS: 1.0000 | ORAL_CAPSULE | Freq: Every day | ORAL | Status: DC

## 2012-04-02 NOTE — Progress Notes (Signed)
  Subjective:    Patient ID: [name], female    DOB: [birth year], 78 y.o.   MRN: [id number]  HPI ER f/u- pt was seen in the ER twice in 2 days for R flank pain after seeing Dr Drue Novel for similar sxs.  Had normal abd [place] but 'bacteria in the bladder'.  After 1st ER visit was given script for Tramadol.  Woke up the morning after starting med and had low BP, Afib, dizziness and blurry vision.  sxs improved w/ IV fluids.  Also dx'd w/ constipation and lumbar strain.  After first ER visit was dx'd ileus- started on Miralax.  Feels MOM works better than Miralax.  Reports alternating bouts of diarrhea and constipation.  Also had hyponatremia at 1st ER visit- this had resolved by 2nd visit.  Pt reports that having BM will only improve pain for 5-10 min.  Pain occurs w/ movement or taking a deep breath.  Hx of lumbar compression fx.  No relief w/ ibuprofen, aleve.  Review of Systems For ROS see HPI     Objective:   Physical Exam  Vitals reviewed. Constitutional: She is oriented to person, place, and time. She appears well-developed and well-nourished. No distress.  HENT:  Head: Normocephalic and atraumatic.  Eyes: Conjunctivae normal and EOM are normal. Pupils are equal, round, and reactive to light.  Neck: Normal range of motion. Neck supple. No thyromegaly present.  Cardiovascular: Normal rate, regular rhythm, normal heart sounds and intact distal pulses.   Pulmonary/Chest: Effort normal and breath sounds normal. No respiratory distress.  Abdominal: Soft. Bowel sounds are normal. She exhibits no distension. There is no tenderness. There is no rebound and no guarding.  Musculoskeletal: She exhibits no edema.       R hip pain- TTP over R iliac crest, pain w/ hip flexion, limited internal/external rotation  Lymphadenopathy:    She has no cervical adenopathy.  Neurological: She is alert and oriented to person, place, and time.  Skin: Skin is warm and dry.  Psychiatric: Her behavior is normal.       Very anxious          Assessment & Plan:

## 2012-04-02 NOTE — Patient Instructions (Addendum)
Start the Linzess daily for the constipation associated w/ IBS Take the Mobic daily for inflammation- no additional ibuprofen/aleve Add Tylenol twice daily to help w/ pain Heat or ice your hip- whichever feels better If your symptoms don't improve- please call Dr Russella Dar about the constipation Make sure you tell ortho about your hip pain Call with any questions or concerns HAPPY HOLIDAYS!!!

## 2012-04-05 ENCOUNTER — Ambulatory Visit: Payer: Self-pay | Admitting: Family Medicine

## 2012-04-06 DIAGNOSIS — Z0279 Encounter for issue of other medical certificate: Secondary | ICD-10-CM

## 2012-04-18 NOTE — Assessment & Plan Note (Signed)
Recurrent problem.  Start daily NSAID.  Pt has appt upcoming w/ ortho.  Reviewed that her pain is her hip and not her abd/groin/flank as she thought.  Will follow.

## 2012-04-18 NOTE — Assessment & Plan Note (Signed)
Chronic problem.  Pt already on MoM and Miralax.  Continues to have constipation predominant IBS- will start Linzess, samples given along w/ instructions for use.  Encouraged follow up w/ GI if sxs persist.  Pt expressed understanding and is in agreement w/ plan.

## 2012-04-18 NOTE — Assessment & Plan Note (Signed)
Chronic problem, has been asymptomatic.  Pt has upcoming appt w/ ortho.  Start daily NSAID.  Reviewed supportive care and red flags that should prompt return.  Pt expressed understanding and is in agreement w/ plan.

## 2012-04-18 NOTE — Assessment & Plan Note (Signed)
New.  Pt's hx of chronic constipation w/ intermittent diarrhea is consistent w/ IBS-C.  Start Linzess.  Samples given and pt instructed on proper use.  Reviewed supportive care and red flags that should prompt return.  Pt expressed understanding and is in agreement w/ plan.

## 2012-04-19 ENCOUNTER — Encounter (HOSPITAL_COMMUNITY): Payer: Self-pay | Admitting: Licensed Clinical Social Worker

## 2012-04-19 ENCOUNTER — Ambulatory Visit (INDEPENDENT_AMBULATORY_CARE_PROVIDER_SITE_OTHER): Payer: Medicare Other | Admitting: Licensed Clinical Social Worker

## 2012-04-19 DIAGNOSIS — F411 Generalized anxiety disorder: Secondary | ICD-10-CM

## 2012-04-19 NOTE — Progress Notes (Signed)
   THERAPIST PROGRESS NOTE  Session Time: 8:30am-9:20am  Participation Level: Active  Behavioral Response: Well GroomedAlertAnxious  Type of Therapy: Individual Therapy  Treatment Goals addressed: Anxiety and Coping  Interventions: CBT, Strength-based, Supportive and Reframing  Summary: [name] is a 78 y.o. female who presents with anxious mood and affect. She is using a cane today and reports trouble breathing and unsteady gait. She recently returned from a trip to [place] to visit her daughter for the holiday, which she found very stressful. She is upset that she gave her daughter who lives in [place] $5,000 dollars and is now focused on how this will negatively impact her budget. She endorses increased anxiety about finances and relationships. She is unhappy with her friend who takes care of her cat when she is away and feels that she takes advantage of patient. She endorses increased confusion, difficulty making decisions and difficulty problem solving. She is tired and has trouble accepting a natural decreased level of energy as she ages. Her sleep and appetite are wnl.    Suicidal/Homicidal: Nowithout intent/plan  Therapist Response: Assessed patients current functioning and reviewed progress. Reviewed coping strategies. Assessed patients safety and assisted in identifying protective factors.  Reviewed crisis plan with patient. Assisted patient with the expression of her feelings of anxiety. Patients thoughts are negative and distorted. She needs frequent reassurance about choices and decisions she has made. Used CBT to assist patient with the identification of negative distortions and irrational thoughts. Encouraged patient to verbalize alternative and factual responses which challenge thought distortions. Used DBT to practice mindfulness, review distraction list and improve distress tolerance skills. Reviewed patients self care plan. Assessed  progress related to self care.  Patient's self care is good. Recommend proper diet, regular exercise, socialization and recreation.   Plan: Return again in two weeks.  Diagnosis: Axis I: Generalized Anxiety Disorder    Axis II: No diagnosis    Mickelle Goupil, LCSW 04/19/2012

## 2012-04-27 ENCOUNTER — Telehealth: Payer: Self-pay | Admitting: *Deleted

## 2012-04-27 DIAGNOSIS — M67919 Unspecified disorder of synovium and tendon, unspecified shoulder: Secondary | ICD-10-CM | POA: Diagnosis not present

## 2012-04-27 NOTE — Telephone Encounter (Signed)
Patient called wants to know status of paperwork for insurance company? CB# [phone]

## 2012-05-03 ENCOUNTER — Encounter (HOSPITAL_COMMUNITY): Payer: Self-pay | Admitting: Licensed Clinical Social Worker

## 2012-05-03 ENCOUNTER — Ambulatory Visit (INDEPENDENT_AMBULATORY_CARE_PROVIDER_SITE_OTHER): Payer: Medicare Other | Admitting: Licensed Clinical Social Worker

## 2012-05-03 DIAGNOSIS — F411 Generalized anxiety disorder: Secondary | ICD-10-CM

## 2012-05-03 DIAGNOSIS — M67919 Unspecified disorder of synovium and tendon, unspecified shoulder: Secondary | ICD-10-CM | POA: Diagnosis not present

## 2012-05-03 DIAGNOSIS — M545 Low back pain: Secondary | ICD-10-CM | POA: Diagnosis not present

## 2012-05-03 NOTE — Progress Notes (Signed)
   THERAPIST PROGRESS NOTE  Session Time: 8:30.am-9:20am  Participation Level: Active  Behavioral Response: Well GroomedAlertAnxious  Type of Therapy: Individual Therapy  Treatment Goals addressed: Anxiety and Coping  Interventions: CBT, DBT, Strength-based, Supportive and Reframing  Summary: [name] is a 79 y.o. female who presents with anxious mood and affect. She is in pain today, with hip and flank pain. She has been referred to physical therapy to address these issues. She is frustratd by her pain. She is pleased to report that she has returned to Sunday School. She wants to make a larger effort to spend time with peers who help her feel good about herself. She is concerned about her daughter Sedalia Muta and endorses feeling guilty because she does not want to talk with her because it is too difficult given [name]'s temperament. She wants her daughter to understand that she wants the best for her and has difficulty believing that she understands this. Her sleep and appetite are wnl.   Suicidal/Homicidal: Nowithout intent/plan  Therapist Response: Assessed patients current functioning and reviewed progress. Reviewed coping strategies. Assessed patients safety and assisted in identifying protective factors.  Reviewed crisis plan with patient. Assisted patient with the expression of her feelings of anxiety. Used CBT to assist patient with the identification of negative distortions and irrational thoughts. Encouraged patient to verbalize alternative and factual responses which challenge thought distortions. Patient struggles with feelings of guilt and self doubt. She needs frequent reassurance. Her patterns of co-dependency remain problematic in her relationships with her children and peers. Reviewed patients self care plan. Assessed  progress related to self care. Patient's self care is good. Recommend proper diet, regular exercise, socialization and recreation.   Plan: Return again in  two weeks.  Diagnosis: Axis I: Generalized Anxiety Disorder    Axis II: No diagnosis    Kenyatte Chatmon, LCSW 05/03/2012

## 2012-05-05 DIAGNOSIS — M545 Low back pain: Secondary | ICD-10-CM | POA: Diagnosis not present

## 2012-05-05 DIAGNOSIS — M79609 Pain in unspecified limb: Secondary | ICD-10-CM | POA: Diagnosis not present

## 2012-05-05 DIAGNOSIS — M67919 Unspecified disorder of synovium and tendon, unspecified shoulder: Secondary | ICD-10-CM | POA: Diagnosis not present

## 2012-05-05 DIAGNOSIS — B351 Tinea unguium: Secondary | ICD-10-CM | POA: Diagnosis not present

## 2012-05-05 DIAGNOSIS — M719 Bursopathy, unspecified: Secondary | ICD-10-CM | POA: Diagnosis not present

## 2012-05-10 ENCOUNTER — Encounter (HOSPITAL_COMMUNITY): Payer: Self-pay | Admitting: Psychiatry

## 2012-05-10 ENCOUNTER — Ambulatory Visit (HOSPITAL_COMMUNITY): Payer: Self-pay | Admitting: Psychiatry

## 2012-05-10 ENCOUNTER — Ambulatory Visit (INDEPENDENT_AMBULATORY_CARE_PROVIDER_SITE_OTHER): Payer: Medicare Other | Admitting: Psychiatry

## 2012-05-10 VITALS — BP 100/62 | HR 72 | Ht 69.5 in | Wt 156.6 lb

## 2012-05-10 DIAGNOSIS — F411 Generalized anxiety disorder: Secondary | ICD-10-CM

## 2012-05-10 DIAGNOSIS — F419 Anxiety disorder, unspecified: Secondary | ICD-10-CM

## 2012-05-10 MED ORDER — SERTRALINE HCL 100 MG PO TABS: 100.0000 mg | ORAL_TABLET | Freq: Every day | ORAL | Status: DC

## 2012-05-10 NOTE — Progress Notes (Signed)
[facility] [address] Progress Note  [name] [id number] 79 y.o.  05/10/2012 2:46 PM  Chief Complaint: I am taking Zoloft 100 mg.  It is helping her anxiety but is still have a lot of issues and physical symptoms.  History of Present Illness: Patient came for her followup appointment.  She's taking Zoloft 100 mg a day.  She feels less anxious and less depressed but she continued to obsess about her physical symptoms.  Recently she has UTI she finished taking antibiotic but is still has abdominal pain.  She's obsess about her abdominal pain.  She has seen twice in the emergency room and seen primary care physician .  She's not taking any pain medication .  She scheduled to see orthopedic in February and hoping to have of physical therapy help her back pain.  Patient is also told earlier that she may have Parkinson but she does not feel that she had any Parkinson.  She has some shuffling gait but she denies any tremors or any muscle stiffness.  This is the first time she endorse that she has visual hallucination and some time seeing shadows.  However she denies any active or passive suicidal thoughts.  I review her medication , she started MiraLax recently.  She has chronic abdominal pain .  She's compliant with Zoloft.  She denies any side effects.  She denies any crying spells , agitation anger mood swing.  She sleeping better.  Suicidal Ideation: No Plan Formed: No Patient has means to carry out plan: No  Homicidal Ideation: No Plan Formed: No Patient has means to carry out plan: No  Review of Systems: Psychiatric: Agitation: No Hallucination: No Depressed Mood: No Insomnia: No Hypersomnia: No Altered Concentration: No Feels Worthless: No Grandiose Ideas: No Belief In Special Powers: No New/Increased Substance Abuse: No Compulsions: No  Neurologic: Headache: Yes Seizure: No Paresthesias: No  Past Medical Family, Social History: Patient has history of arthritis,  hyperlipidemia, hemorrhoid, chronic constipation, GERD, recurrent UTI, Parkinson disease, chronic arrhythmia.  She see physician at [facility].  Patient has 2 daughter.  Her parents are deceased.  Outpatient Encounter Prescriptions as of 05/10/2012  Medication Sig Dispense Refill  . albuterol (VENTOLIN HFA) 108 (90 BASE) MCG/ACT inhaler Inhale 2 puffs into the lungs 4 (four) times daily as needed. For shortness of breath      . aspirin 81 MG tablet Take 81 mg by mouth daily.       . beclomethasone (QVAR) 80 MCG/ACT inhaler Inhale 2 puffs into the lungs 2 (two) times daily.  1 Inhaler  6  . Calcium Carbonate-Vitamin D (CALCIUM 600+D) 600-400 MG-UNIT per tablet Take 1 tablet by mouth 2 (two) times daily.        . Cranberry-Vitamin C-Vitamin E (CRANBERRY SUPER STRENGTH) 6000-100-3 MG-MG-UNIT CAPS Take 1 capsule by mouth every morning.        . digoxin (LANOXIN) 0.125 MG tablet Take 125 mcg by mouth daily.        . fish oil-omega-3 fatty acids 1000 MG capsule Take 1 g by mouth every morning.       . flecainide (TAMBOCOR) 50 MG tablet 25 mg 2 (two) times daily.       . Multiple Vitamin (MULTIVITAMIN WITH MINERALS) TABS Take 1 tablet by mouth daily.      . Polyethylene POWD by Does not apply route. Take 1 capful daily      . sertraline (ZOLOFT) 100 MG tablet Take 1 tablet (100 mg total)  by mouth daily.  90 tablet  0  . [DISCONTINUED] sertraline (ZOLOFT) 50 MG tablet Take 50 mg by mouth daily.      Marland Kitchen trimethoprim (TRIMPEX) 100 MG tablet Take 100 mg by mouth at bedtime.      . [DISCONTINUED] Linaclotide (LINZESS) 290 MCG CAPS Take 1 capsule by mouth daily.  30 capsule  3  . [DISCONTINUED] meloxicam (MOBIC) 7.5 MG tablet Take 1 tablet (7.5 mg total) by mouth daily.  30 tablet  3  . [DISCONTINUED] pantoprazole (PROTONIX) 20 MG tablet Take 1 tablet (20 mg total) by mouth daily.  30 tablet  6    Past Psychiatric History/Hospitalization(s): Anxiety: Yes Bipolar Disorder: No Depression: No Mania:  No Psychosis: No Schizophrenia: No Personality Disorder: No Hospitalization for psychiatric illness: No History of Electroconvulsive Shock Therapy: No Prior Suicide Attempts: No  Physical Exam: Constitutional:  BP 100/62  Pulse 72  Ht 5' 9.5" (1.765 m)  Wt 156 lb 9.6 oz (71.033 kg)  BMI 22.79 kg/m2  General Appearance: alert, oriented, no acute distress  Musculoskeletal: Strength & Muscle Tone: decreased Gait & Station: shuffle Patient leans: Front  Mental status examination Patient is a thin lean person who is well groomed well dressed.  She is very anxious but cooperative.  Her speech is fast with rapid volume and tone.  Her thought process is also fast and at times circumstantial.  There were no tremors or shakes present per her psychomotor activity is slightly increased.  She maintained fair eye contact.  Her attention and concentration is distracted at times.  She described her mood is anxious and her affect is mood appropriate.  She denies any auditory hallucination but admitted visual hallucination and seeing shadows.  She denies any paranoia or any delusion at this time.  There were no flight of ideas.  She's alert and oriented x3.  However she do not recall very well remote memory.  Her insight judgment and impulse control is okay.  Medical Decision Making (Choose Three): Established Problem, Stable/Improving (1), New Problem, with no additional work-up planned (3), Review of Last Therapy Session (1), Review or order medicine tests (1), Review of Medication Regimen & Side Effects (2) and Review of New Medication or Change in Dosage (2)  Assessment: Axis I: Anxiety disorder  Axis II: Deferred  Axis III: History of arthritis, hyperlipidemia, hemorrhoid, chronic constipation, GERD, recurrent UTI, Parkinson disease, chronic arrhythmia.  Recent back and shoulder pain.  Axis IV: Mild to moderate  Axis V: 60-65   Plan: I discuss the patient in length about her chronic  somatic symptoms.  She feel Zoloft is helping her anxiety and depression.  I recommend to stop MiraLax since she may have visual hallucination but MiraLax.  I strongly encouraged her to keep appointment with the orthopedic for back pain and also see neurologist for followup to rule out Parkinson.  She has seen Dr. Danae Orleans at [facility] but she never made an appointment.  I explain in detail risk and benefits of medication.  I will continue Zoloft 100 mg a day.  I will see her again in 3 months.  I recommend to see therapist for coping and social skills.  Time spent 25 minutes.  More than 50% of time spent and constant psychoeducation and coordination of care.  ARFEEN,SYED T., MD 05/10/2012

## 2012-05-13 DIAGNOSIS — M719 Bursopathy, unspecified: Secondary | ICD-10-CM | POA: Diagnosis not present

## 2012-05-13 DIAGNOSIS — M545 Low back pain: Secondary | ICD-10-CM | POA: Diagnosis not present

## 2012-05-17 ENCOUNTER — Ambulatory Visit (HOSPITAL_COMMUNITY): Payer: Self-pay | Admitting: Licensed Clinical Social Worker

## 2012-05-17 ENCOUNTER — Encounter (HOSPITAL_COMMUNITY): Payer: Self-pay | Admitting: Licensed Clinical Social Worker

## 2012-05-17 ENCOUNTER — Telehealth: Payer: Self-pay | Admitting: Family Medicine

## 2012-05-17 DIAGNOSIS — N3020 Other chronic cystitis without hematuria: Secondary | ICD-10-CM | POA: Diagnosis not present

## 2012-05-17 NOTE — Telephone Encounter (Signed)
Agree w/ urology appt

## 2012-05-17 NOTE — Telephone Encounter (Signed)
Spoke with the pt and informed her that Dr. Beverely Low agreed to the appt with the urology today.  Also pt asked about keeping the appt with Dr. Beverely Low on Friday, and I informed her to keep the appt.  Pt agreed.//AB/CMA

## 2012-05-17 NOTE — Telephone Encounter (Signed)
Patient Information:  Caller Name: Jamyia  Phone: [phone]  Patient: [name], [name]  Gender: Female  DOB: 07-Dec-1927  Age: 79 Years  PCP: Sheliah Hatch.  Office Follow Up:  Does the office need to follow up with this patient?: No  Instructions For The Office: N/A  RN Note:  Has appointment with Urology at 1300 05/17/12.  Symptoms  Reason For Call & Symptoms: Started with blood in her urine this morning- 05/17/12. She is having burning and pain with urination and only able to pass small amount of urine. She is scheduled to see Physician Assistant at Urologist office today- 05/17/12. Also having R sided  back/side pain for past few months. Checked by [employer] and tried PT and not helping.  Reviewed Health History In EMR: Yes  Reviewed Medications In EMR: Yes  Reviewed Allergies In EMR: Yes  Reviewed Surgeries / Procedures: Yes  Date of Onset of Symptoms: 05/17/2012  Treatments Tried: Cranberry Caps, Increased fluids  Treatments Tried Worked: No  Guideline(s) Used:  Urination Pain - Female  Disposition Per Guideline:   Go to ED Now (or to Office with PCP Approval)  Reason For Disposition Reached:   Unable to urinate (or only a few drops) and bladder feels very full  Advice Given:  Fluids:   Drink extra fluids. Drink 8-10 glasses of liquids a day (Reason: to produce a dilute, non-irritating urine).  Cranberry Juice:   Some people think that drinking cranberry juice may help in fighting urinary tract infections. However, there is no good research that has ever proved this.  Call Back If:  You become worse.  Warm Saline SITZ Baths to Reduce Pain:  Sit in a warm saline bath for 20 minutes to cleanse the area and to reduce pain. Add 2 oz. of table salt or baking soda to a tub of water.  Call Back If:   Fever lasts more than 24 hours on antibiotics  Pain does not improve by day 3 on antibiotics  Urine symptoms do not improve by day 3 on antibiotics  You become  worse.  Patient Refused Recommendation:  Patient Will Follow Up With Office Later  Already scheduled to see Urologist today at 1300

## 2012-05-17 NOTE — Progress Notes (Signed)
Patient ID: [name], female   DOB: 08-03-1927, 79 y.o.   MRN: [id number] Patient cancelled late due to illness.

## 2012-05-21 ENCOUNTER — Ambulatory Visit (INDEPENDENT_AMBULATORY_CARE_PROVIDER_SITE_OTHER): Payer: Medicare Other | Admitting: Nurse Practitioner

## 2012-05-21 ENCOUNTER — Encounter: Payer: Self-pay | Admitting: Family Medicine

## 2012-05-21 ENCOUNTER — Ambulatory Visit (INDEPENDENT_AMBULATORY_CARE_PROVIDER_SITE_OTHER): Payer: Medicare Other | Admitting: Family Medicine

## 2012-05-21 ENCOUNTER — Telehealth: Payer: Self-pay | Admitting: Gastroenterology

## 2012-05-21 ENCOUNTER — Encounter: Payer: Self-pay | Admitting: Nurse Practitioner

## 2012-05-21 ENCOUNTER — Telehealth (INDEPENDENT_AMBULATORY_CARE_PROVIDER_SITE_OTHER): Payer: Self-pay

## 2012-05-21 VITALS — BP 110/58 | HR 70 | Ht 71.0 in | Wt 155.0 lb

## 2012-05-21 VITALS — BP 104/62 | HR 69 | Temp 97.7°F | Ht 71.0 in | Wt 155.2 lb

## 2012-05-21 DIAGNOSIS — R109 Unspecified abdominal pain: Secondary | ICD-10-CM | POA: Diagnosis not present

## 2012-05-21 DIAGNOSIS — M7918 Myalgia, other site: Secondary | ICD-10-CM

## 2012-05-21 DIAGNOSIS — IMO0001 Reserved for inherently not codable concepts without codable children: Secondary | ICD-10-CM

## 2012-05-21 LAB — CBC WITH DIFFERENTIAL/PLATELET
Basophils Relative: 0.5 % (ref 0.0–3.0)
Eosinophils Relative: 3.2 % (ref 0.0–5.0)
HCT: 36.2 % (ref 36.0–46.0)
Lymphs Abs: 1.3 10*3/uL (ref 0.7–4.0)
MCV: 92 fl (ref 78.0–100.0)
Monocytes Absolute: 0.6 10*3/uL (ref 0.1–1.0)
Neutrophils Relative %: 67.8 % (ref 43.0–77.0)
RBC: 3.94 Mil/uL (ref 3.87–5.11)
WBC: 6.5 10*3/uL (ref 4.5–10.5)

## 2012-05-21 LAB — HEPATIC FUNCTION PANEL
ALT: 13 U/L (ref 0–35)
AST: 21 U/L (ref 0–37)
Albumin: 3.7 g/dL (ref 3.5–5.2)
Total Protein: 6.7 g/dL (ref 6.0–8.3)

## 2012-05-21 LAB — BASIC METABOLIC PANEL
BUN: 11 mg/dL (ref 6–23)
GFR: 75.09 mL/min (ref >60.00)
Glucose, Bld: 77 mg/dL (ref 70–99)
Potassium: 3.9 mEq/L (ref 3.5–5.1)

## 2012-05-21 MED ORDER — DICLOFENAC SODIUM 1 % TD GEL: 2.0000 g | Freq: Four times a day (QID) | TRANSDERMAL | Status: DC

## 2012-05-21 NOTE — Telephone Encounter (Signed)
Patient will be seen today at 2:30

## 2012-05-21 NOTE — Telephone Encounter (Signed)
Renee called from Dr Rennis Golden office stating pt is being worked up for right upper and lower abd and flank pain. No signs or symptoms of obstruction. She states Ct negative and GB absent on CT. See 05-10-12 MD note in epic. Wanting direction on having abd pain worked up. Since Ct is negative and pt has not seen GI for work up she will refer pt to GI MD. I advised Renee Dr Lindie Spruce has seen pt in past for surgery may years ago and we will be happy to see pt if any surgical indication occurs.

## 2012-05-21 NOTE — Progress Notes (Signed)
05/21/2012 [name] [id number] Oct 31, 1927   HISTORY OF PRESENT ILLNESS:  Patient is an 79 year old female here for evaluation of right flank pain which started in November. CTscan of abdomen and pelvis with contrast December 2013 revealed mildly increased superior endplate compression deformity of L1. PCP referred her to [facility] who recommended physical therapy for the pain. She had 3 sessions of therapy, didn't help. She stopped going.  Pain present everyday, not affected by meals. Movement affects the pain. Often hurts when turning corners while driving. Hurts to twist torso, hurts to take a deep breath.  It hurts to strain with defecation. Pain described as burning. Ice Pack and ICE topical ointment helps.  Patient describes a life long problem with altered bowel habits, mainly constipation for which she has been evaluated by Dr. Russella Dar. Colonoscopy with random biopsies January 2011 were normal. No polyps found the patient does have a history of a tubulovillous adenomatous polyp 1993    She also gives a history of recurrent UTIs.   Past Medical History  Diagnosis Date  . Arthritis   . Hyperlipidemia   . 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 Chronic constipation   . Hemorrhoid   . Varicose veins   . Anxiety   . Depression   . GERD (gastroesophageal reflux disease)   . Atrial fibrillation   . Recurrent UTI   . Parkinson disease   . Cardiac arrhythmia due to congenital heart disease    Past Surgical History  Procedure Date  . Appendectomy   . Ileal resection 03/2007    Due to obstruction    reports that she quit smoking about 32 years ago. Her smoking use included Cigarettes. She has a 60 pack-year smoking history. She does not have any smokeless tobacco history on file. She reports that she drinks about 1.2 ounces of alcohol per week. She reports that she does not use illicit drugs. family history includes Alcohol abuse in her brother, father, maternal  grandfather, paternal grandmother, and sister; Anxiety disorder in her daughter; Asthma in her brother; Bipolar disorder in her daughter; Lupus in her daughters; and Throat cancer in her father. Allergies  Allergen Reactions  . Adhesive (Tape) Other (See Comments)    Reaction :  blisters  . Amoxicillin Other (See Comments)    Reaction : thrush  . Ciprofloxacin Other (See Comments)    Reaction : thrush  . Ultram (Tramadol)     hypotension      Outpatient Encounter Prescriptions as of 05/21/2012  Medication Sig Dispense Refill  . albuterol (VENTOLIN HFA) 108 (90 BASE) MCG/ACT inhaler Inhale 2 puffs into the lungs 4 (four) times daily as needed. For shortness of breath      . aspirin 81 MG tablet Take 81 mg by mouth daily.       . beclomethasone (QVAR) 80 MCG/ACT inhaler Inhale 2 puffs into the lungs 2 (two) times daily.  1 Inhaler  6  . Calcium Carbonate-Vitamin D (CALCIUM 600+D) 600-400 MG-UNIT per tablet Take 1 tablet by mouth 2 (two) times daily.        . Cranberry-Vitamin C-Vitamin E (CRANBERRY SUPER STRENGTH) 6000-100-3 MG-MG-UNIT CAPS Take 1 capsule by mouth every morning.        . digoxin (LANOXIN) 0.125 MG tablet Take 125 mcg by mouth daily.        . fish oil-omega-3 fatty acids 1000 MG capsule Take 1 g by mouth every morning.       . flecainide (  TAMBOCOR) 50 MG tablet 25 mg 2 (two) times daily.       . Multiple Vitamin (MULTIVITAMIN WITH MINERALS) TABS Take 1 tablet by mouth daily.      . Polyethylene POWD by Does not apply route. Take 1 capful daily      . sertraline (ZOLOFT) 100 MG tablet Take 1 tablet (100 mg total) by mouth daily.  90 tablet  0  . trimethoprim (TRIMPEX) 100 MG tablet Take 100 mg by mouth at bedtime.         REVIEW OF SYSTEMS  : All other systems reviewed and negative except where noted in the History of Present Illness.   PHYSICAL EXAM: BP 110/58  Pulse 70  Ht 5\' 11"  (1.803 m)  Wt 155 lb (70.308 kg)  BMI 21.62 kg/m2 General: Pleasant, well developed  white female in no acute distress Head: Normocephalic and atraumatic Eyes:  sclerae anicteric,conjunctive pink. Ears: Normal auditory acuity Neck: Supple, no masses.  Lungs: Clear throughout to auscultation Heart: Regular rate and rhythm Abdomen: Soft,  non distended. No masses or hepatomegaly noted. Normal bowel sounds Musculoskeletal: Symmetrical with no gross deformities . Significant tenderness along right rib cage. She is also tender in right waist (just above right iliac around waist).  Skin: No lesions on visible extremities Extremities: No edema  Neurological: Alert oriented x 4, grossly nonfocal Cervical Nodes:  No significant cervical adenopathy Psychological:  Alert and cooperative. Normal mood and affect  ASSESSMENT AND PLAN:  1. Several month history of right flank pain. 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 PCP referred her to surgery for further evaluation of pain. Apparently surgery previewed records today, didn't feel patient needed surgical evaluation but recommended GI evaluation so patient worked in today. Her pain is not GI in origin based on history nor today's exam. I asked Dr.Gessner in our office to examine her as well. He agreed with my assessment. Pain clearly positional, related to movements. Will try Voltaren Gel, hopefully insurance will pay for it. Patient advised to avoid exercise, unnecessary twisting for a few weeks. She does have a follow up with ortho next week. No GI workup needed.   2. Chronically altered bowel habits, predominally constipation. Recommend trial of daily Miralax, hold for loose stools.   3. ?history of cholecystectomy. Ultrasound in November 2013 refers to unremarkable gallbladder. CTscan in December states gallbladder absent.

## 2012-05-21 NOTE — Patient Instructions (Addendum)
We'll have surgery evaluate the situation and see if they can find the cause We'll notify you of your lab results Call with any questions or concerns Hang in there!

## 2012-05-21 NOTE — Progress Notes (Signed)
Reviewed and agree with management plan.  Further follow up and treatment for this problem with her PCP since it is not a GI problem.   Venita Lick. Russella Dar, MD [facility]

## 2012-05-21 NOTE — Progress Notes (Signed)
  Subjective:    Patient ID: [name], female    DOB: October 01, 1927, 79 y.o.   MRN: [id number]  HPI R flank pain- pt was seen on Monday at [facility] and told she had a UTI, waiting on UC results prior to abx.  Saw ortho, was referred to PT.  Pt's pain will migrate up R flank, into back.  Pain w/ turning the car.  Has upcoming appt w/ GI.  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  Had normal CT scan in December (although it reported Gallbladder was surgically absent and pt reports this is not true)  Pt has had 5 lb weight loss since August despite eating well.  Pain is intense.  Pt very frustrated.  Reports pain is 8/10.  Painful to touch abdomen.  'it just hurts'  No N/V, hx of altered bowel habits but nothing different from previous.  No fevers.   Review of Systems For ROS see HPI     Objective:   Physical Exam  Vitals reviewed. Constitutional: She appears well-developed and well-nourished. No distress.  HENT:  Head: Normocephalic and atraumatic.  Cardiovascular: Normal rate.        irreg S1/S2  Pulmonary/Chest: Effort normal and breath sounds normal. No respiratory distress. She has no wheezes. She has no rales. She exhibits no tenderness.  Abdominal: Soft. Bowel sounds are normal. She exhibits no distension and no mass. There is tenderness (RLQ). There is no rebound and no guarding.  Psychiatric:       Very anxious          Assessment & Plan:

## 2012-05-21 NOTE — Patient Instructions (Addendum)
We sent a prescription to [facility] for Voltaren Ger. 1%. Follow instructions.  Avoid exercise/twisting the upper body for 4-6-weeks.

## 2012-05-23 NOTE — Assessment & Plan Note (Signed)
Ongoing problem for pt.  Initially suspected it was musculoskeletal- still do- but pt reports ortho and PT feel that there is something else going on.  Pt has seen urology.  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  Attempted to refer to surgery who felt pt needed to see GI first.  Will get labs.  Will continue to follow.

## 2012-05-24 ENCOUNTER — Encounter: Payer: Self-pay | Admitting: Family Medicine

## 2012-05-24 ENCOUNTER — Encounter: Payer: Self-pay | Admitting: *Deleted

## 2012-05-25 DIAGNOSIS — Z8262 Family history of osteoporosis: Secondary | ICD-10-CM | POA: Diagnosis not present

## 2012-05-25 DIAGNOSIS — M949 Disorder of cartilage, unspecified: Secondary | ICD-10-CM | POA: Diagnosis not present

## 2012-05-25 DIAGNOSIS — M545 Low back pain: Secondary | ICD-10-CM | POA: Diagnosis not present

## 2012-05-25 DIAGNOSIS — M899 Disorder of bone, unspecified: Secondary | ICD-10-CM | POA: Diagnosis not present

## 2012-05-25 NOTE — Progress Notes (Signed)
Agree with Ms. Guenther's assessment and plan. Rexton Greulich E. Kyriaki Moder, MD, FACG   

## 2012-05-31 ENCOUNTER — Ambulatory Visit (INDEPENDENT_AMBULATORY_CARE_PROVIDER_SITE_OTHER): Payer: Medicare Other | Admitting: Licensed Clinical Social Worker

## 2012-05-31 DIAGNOSIS — F411 Generalized anxiety disorder: Secondary | ICD-10-CM

## 2012-05-31 NOTE — Progress Notes (Signed)
   THERAPIST PROGRESS NOTE  Session Time: 9:30am-10:20am  Participation Level: Active  Behavioral Response: Well GroomedAlertAnxious and Irritable  Type of Therapy: Individual Therapy  Treatment Goals addressed: Anxiety and Coping  Interventions: CBT, Motivational Interviewing, Strength-based, Supportive and Reframing  Summary: [name] is a 79 y.o. female who presents with anxious mood and irritable affect. Patient is very agitated today. She endorses feeling overwhelmed by money, her health and her two children. She is angry that she continues to receive medical bills for services she believes are covered. She is anxious that her money is going to run out. She is angry that her daughter [name], continues to ask her for money, which she gives her, but tells her that she cannot do this any longer. She is angry that her daughter does not respect that boundary because patient has a difficult time saying no to her. She is experiencing multiple health problems, some of which doctors are unable to diagnose, such as flank pain. She is continues to see multiple doctors looking for answers.  She wants to give up sometimes, but fights this feeling. Her sleep and appetite are wnl.   Suicidal/Homicidal: Nowithout intent/plan  Therapist Response: Assessed patients current functioning and reviewed progress. Reviewed coping strategies. Assessed patients safety and assisted in identifying protective factors.  Reviewed crisis plan with patient. Assisted patient with the expression of her feelings of anger . Used CBT to assist patient with the identification of negative distortions and irrational thoughts. Encouraged patient to verbalize alternative and factual responses which challenge thought distortions. Patient demonstrates mild confusion, difficulty organizing her thoughts as well as she used to be able to do and difficulty executing plans. She refuses to accept her diagnosis of Parkinson's. Used  motivational interviewing to assist and encourage patient through the change process. Explored patients barriers to change. Reviewed patients self care plan. Assessed  progress related to self care. Patient's self care is good. Recommend proper diet, regular exercise, socialization and recreation.   Plan: Return again in two weeks.  Diagnosis: Axis I: Generalized Anxiety Disorder    Axis II: No diagnosis    Sacheen Arrasmith, LCSW 05/31/2012

## 2012-06-02 ENCOUNTER — Ambulatory Visit: Payer: Self-pay | Admitting: Gastroenterology

## 2012-06-05 ENCOUNTER — Other Ambulatory Visit: Payer: Self-pay

## 2012-06-09 ENCOUNTER — Encounter: Payer: Self-pay | Admitting: Family Medicine

## 2012-06-10 NOTE — Telephone Encounter (Signed)
Please advise.//AB/CMA 

## 2012-06-14 ENCOUNTER — Ambulatory Visit (INDEPENDENT_AMBULATORY_CARE_PROVIDER_SITE_OTHER): Payer: Medicare Other | Admitting: Licensed Clinical Social Worker

## 2012-06-14 DIAGNOSIS — F411 Generalized anxiety disorder: Secondary | ICD-10-CM

## 2012-06-14 NOTE — Progress Notes (Signed)
   THERAPIST PROGRESS NOTE  Session Time: 9:30am-10:20am  Participation Level: Active  Behavioral Response: Well GroomedAlertAnxious  Type of Therapy: Individual Therapy  Treatment Goals addressed: Coping  Interventions: CBT, Solution Focused, Strength-based, Supportive and Reframing  Summary: [name] is a 79 y.o. female who presents with anxious mood and affect. She reports ongoing anxiety, increased confusion and difficulty planning, organizing and executing plans. She reports being diagnosed with Lewy Body Dementia and she does not believe or accept this. She will see her neurologist this week and is brining someone to help her with the visit. She is upset with her daughter who continues to ask her and her sister for money. Patient does not like talking to her because she feels pressured to give her money. She has difficulty managing her anxiety about her daughter. She processes her ongoing anxiety about how she interacts with her friends. She is trying to stand up for herself more and speak up when upset, but she finds the aftermath of this experience anxiety producing. Her sleep and appetite are wnl.    Suicidal/Homicidal: Nowithout intent/plan  Therapist Response: Assessed patients current functioning and reviewed progress. Reviewed coping strategies. Assessed patients safety and assisted in identifying protective factors.  Reviewed crisis plan with patient. Assisted patient with the expression of her feelings of anxiety. Used CBT to assist patient with the identification of negative distortions and irrational thoughts. Encouraged patient to verbalize alternative and factual responses which challenge thought distortions. Patient engages in strong negative distortions, with negative projection and catastrophizing thinking. She does respond to redirection, but needs frequent redirection and assurance to combat her self doubt. Reviewed patients self care plan. Assessed  progress  related to self care. Patient's self care is good. Recommend proper diet, regular exercise, socialization and recreation.   Plan: Return again in two weeks.  Diagnosis: Axis I: Generalized Anxiety Disorder    Axis II: No diagnosis    Shandon Matson, LCSW 06/14/2012

## 2012-06-15 DIAGNOSIS — R55 Syncope and collapse: Secondary | ICD-10-CM | POA: Diagnosis not present

## 2012-06-15 DIAGNOSIS — G20 Parkinson's disease: Secondary | ICD-10-CM | POA: Diagnosis not present

## 2012-06-28 ENCOUNTER — Ambulatory Visit (INDEPENDENT_AMBULATORY_CARE_PROVIDER_SITE_OTHER): Payer: Medicare Other | Admitting: Licensed Clinical Social Worker

## 2012-06-28 DIAGNOSIS — F411 Generalized anxiety disorder: Secondary | ICD-10-CM

## 2012-06-28 NOTE — Progress Notes (Signed)
   THERAPIST PROGRESS NOTE  Session Time: 9:30am-10:20am  Participation Level: Active  Behavioral Response: Well GroomedAlertAnxious  Type of Therapy: Individual Therapy  Treatment Goals addressed: Coping  Interventions: CBT, DBT, Strength-based, Supportive and Reframing  Summary: [name] is a 79 y.o. female who presents with anxious mood and bright affect. She reports ongoing anxiety and frustration related to her own health and her daughter who lives in [place]. She is upset that her daughter continues to ask her for money and feels awkward saying no. She has tried to communicate this to her daughter, but she does not listen. Patient recently saw her neurologist and is beginning to accept her diagnosis of Parkinson's disease. She is reading about the disease and she recognizes many of the symptoms she has. She states that having this makes her a bad person. She reflects upon what she could have done to prevent this. She talks about being a "bad person" her entire life and how she continues to struggle with this distortion. Her sleep has increased and her appetite is wnl.    Suicidal/Homicidal: Nowithout intent/plan  Therapist Response: Assessed patients current functioning and reviewed progress. Reviewed coping strategies. Assessed patients safety and assisted in identifying protective factors.  Reviewed crisis plan with patient. Assisted patient with the expression of anxiety and frustration. Reviewed patients self care plan. Assessed progress related to self care. Patients self care is good. Recommend daily exercise, increased socialization and recreation. Reviewed healthy boundaries and assertive communication. Used CBT to assist patient with the identification of negative distortions and irrational thoughts. Encouraged patient to verbalize alternative and factual responses which challenge thought distortions. Patient demonstrates very strong distorted thinking. She struggles  with self doubt and self blame which is entrenched and automatic. Used DBT to practice mindfulness, review distraction list and improve distress tolerance skills.   Plan: Return again in two weeks.  Diagnosis: Axis I: Generalized Anxiety Disorder    Axis II: No diagnosis    NORDEN,KRISTIN, LCSW 06/28/2012

## 2012-07-05 ENCOUNTER — Ambulatory Visit (INDEPENDENT_AMBULATORY_CARE_PROVIDER_SITE_OTHER): Payer: Medicare Other | Admitting: Pulmonary Disease

## 2012-07-05 ENCOUNTER — Encounter: Payer: Self-pay | Admitting: Pulmonary Disease

## 2012-07-05 VITALS — BP 78/42 | HR 75 | Temp 97.6°F | Ht 71.0 in | Wt 158.2 lb

## 2012-07-05 DIAGNOSIS — R0609 Other forms of dyspnea: Secondary | ICD-10-CM | POA: Diagnosis not present

## 2012-07-05 DIAGNOSIS — J449 Chronic obstructive pulmonary disease, unspecified: Secondary | ICD-10-CM | POA: Diagnosis not present

## 2012-07-05 DIAGNOSIS — R0989 Other specified symptoms and signs involving the circulatory and respiratory systems: Secondary | ICD-10-CM | POA: Diagnosis not present

## 2012-07-05 NOTE — Assessment & Plan Note (Signed)
I suspect a lot of the patient's symptoms are secondary to anxiety and possibly an element of vocal cord dysfunction, given that inhalers have made very little difference to her symptoms.

## 2012-07-05 NOTE — Assessment & Plan Note (Signed)
The patient has known mild airflow obstruction that is probably related to COPD, but I cannot exclude the possibility of asthma.  She really has not seen a big difference on maintenance therapy, and therefore I suspect her dyspnea on exertion has nothing to do with her mild airflow obstruction.  I have asked her to stay off all medications except for albuterol as needed, and she will let me know if her breathing worsens.  She clearly has an element of vocal cord dysfunction, and I discussed with her control of anxiety and also relaxation techniques such as pursed-lip breathing.

## 2012-07-05 NOTE — Patient Instructions (Addendum)
Ok to stay off qvar and dulera for now.  Can use albuterol if needed for rescue.  If you see a worsening of your breathing over time, you need to let me know and get back on qvar.  If you do well off meds, stay on albuterol as needed alone. Work on some type of exercise program. Please let Dr. Jacinto Halim know that you are still having labile blood pressure readings.  May be worth getting a monitoring device for home, and keep a log for him.  followup with me in 1 year if doing well.

## 2012-07-05 NOTE — Progress Notes (Signed)
  Subjective:    Patient ID: [name], female    DOB: [birth year], 79 y.o.   MRN: [id number]  HPI Patient comes in today for followup of her mild airflow obstruction, noted on pulmonary function studies.  She has had some dyspnea, and it is unclear whether this has anything to do with her PFT findings.  She was tried on dulera for maintenance with no change, and then changed to Qvar for possible asthma.  She did not see any difference in her breathing, and discontinued this, and then started back on leftover dulera samples.  Again, she has not seen any change.  She has had no significant cough or mucus, but does describe at times classic upper airway pseudo-wheezing and throat tightening.   Review of Systems  Constitutional: Negative for fever and unexpected weight change.  HENT: Positive for rhinorrhea and voice change. Negative for ear pain, nosebleeds, congestion, sore throat, sneezing, trouble swallowing, dental problem, postnasal drip and sinus pressure.   Eyes: Negative for redness and itching.  Respiratory: Positive for cough, shortness of breath and wheezing. Negative for chest tightness.   Cardiovascular: Negative for palpitations and leg swelling.  Gastrointestinal: Negative for nausea and vomiting.  Genitourinary: Negative for dysuria.  Musculoskeletal: Negative for joint swelling.  Skin: Negative for rash.  Neurological: Negative for headaches.  Hematological: Does not bruise/bleed easily.  Psychiatric/Behavioral: Negative for dysphoric mood. The patient is not nervous/anxious.        Objective:   Physical Exam Thin female in no acute distress Nose without purulence or discharge noted Neck without lymphadenopathy or thyromegaly Chest totally clear to auscultation, no wheezing Cardiac exam regular rate and rhythm Lower extremities without edema, cyanosis Alert and oriented, moves all 4 extremities.       Assessment & Plan:

## 2012-07-06 ENCOUNTER — Encounter (INDEPENDENT_AMBULATORY_CARE_PROVIDER_SITE_OTHER): Payer: Self-pay | Admitting: General Surgery

## 2012-07-06 ENCOUNTER — Ambulatory Visit (INDEPENDENT_AMBULATORY_CARE_PROVIDER_SITE_OTHER): Payer: Medicare Other | Admitting: General Surgery

## 2012-07-06 VITALS — BP 120/70 | HR 66 | Temp 97.4°F | Resp 18 | Ht 71.0 in | Wt 157.2 lb

## 2012-07-06 DIAGNOSIS — R109 Unspecified abdominal pain: Secondary | ICD-10-CM | POA: Diagnosis not present

## 2012-07-06 NOTE — Progress Notes (Signed)
Patient ID: [name], female   DOB: [birth year], 79 y.o.   MRN: [id number] The patient is an 79 year old female who has previously undergone surgery by Dr. 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 Patient states she's had right sided flank pain for several months. Patient also has history of compression fracture. She sees Dr. Turner Daniels for compression fracture.  The patient is also concerned the CT scan in 2002 and December which reveals a low gallbladder. Patient was unaware of any gallbladder surgery. I reviewed the CT scan until the dictation was apparent as the patient does have a gallbladder on CT scan.  On exam: Her abdomen is soft nontender nondistended with active bowel sounds patient does have midline epigastric incisional hernia.  Assessment and plan: 1. I would recommend the patient to follow up with Dr. Turner Daniels her orthopedist for her back and flank pain.  2. I did not think her pain is related to any biliary colic issues. 3. Call when necessary

## 2012-07-12 ENCOUNTER — Ambulatory Visit (INDEPENDENT_AMBULATORY_CARE_PROVIDER_SITE_OTHER): Payer: Medicare Other | Admitting: Licensed Clinical Social Worker

## 2012-07-12 DIAGNOSIS — F411 Generalized anxiety disorder: Secondary | ICD-10-CM | POA: Diagnosis not present

## 2012-07-12 NOTE — Progress Notes (Signed)
   THERAPIST PROGRESS NOTE  Session Time: 9:30am-10:20am  Participation Level: Active  Behavioral Response: Well GroomedAlertAnxious and Euthymic  Type of Therapy: Individual Therapy  Treatment Goals addressed: Anxiety and Coping  Interventions: CBT, Strength-based, Supportive and Reframing  Summary: [name] is a 79 y.o. female who presents with euthymic mood and bright affect. She is feeling well physically and has been able to exercise some outside, which makes a significant difference in her mood. She does endorse some frustration related to seeing so many doctors. She is, however, trying to accept new medication that she has been put on. She recognizes her resistance to medication and her tendency to try and will herself out of the diseases she has. She is keeping herself busy and continues to struggle with strong self doubt related to her interactions with others. She evaluates and assumes that others responses reflect who she is and define her character. She has decided to stay in [place] and plans to attend a workshop on retirement homes to plan for further assistance. Her sleep and appetite are wnl.    Suicidal/Homicidal: Nowithout intent/plan  Therapist Response: Assessed patients current functioning and reviewed progress. Reviewed coping strategies. Assessed patients safety and assisted in identifying protective factors.  Reviewed crisis plan with patient. Assisted patient with the expression of anxiety. Reviewed patients self care plan. Assessed progress related to self care. Patients self care is good. Recommend daily exercise, increased socialization and recreation. Used CBT to assist patient with the identification of negative distortions and irrational thoughts. Encouraged patient to verbalize alternative and factual responses which challenge thought distortions. Reviewed healthy boundaries and assertive communication. Patient needs frequent redirection and assurance related  to her self concept.   Plan: Return again in two weeks.  Diagnosis: Axis I: Generalized Anxiety Disorder    Axis II: No diagnosis    Rily Nickey, LCSW 07/12/2012

## 2012-07-16 DIAGNOSIS — Z7901 Long term (current) use of anticoagulants: Secondary | ICD-10-CM | POA: Diagnosis not present

## 2012-07-19 ENCOUNTER — Ambulatory Visit (INDEPENDENT_AMBULATORY_CARE_PROVIDER_SITE_OTHER): Payer: Medicare Other | Admitting: Family Medicine

## 2012-07-19 ENCOUNTER — Encounter: Payer: Self-pay | Admitting: Family Medicine

## 2012-07-19 VITALS — BP 90/58 | HR 80 | Temp 98.5°F | Wt 156.0 lb

## 2012-07-19 DIAGNOSIS — J0100 Acute maxillary sinusitis, unspecified: Secondary | ICD-10-CM | POA: Diagnosis not present

## 2012-07-19 MED ORDER — DEXTROMETHORPHAN POLISTIREX 30 MG/5ML PO LQCR: 60.0000 mg | ORAL | Status: DC | PRN

## 2012-07-19 MED ORDER — DOXYCYCLINE HYCLATE 100 MG PO TABS: 100.0000 mg | ORAL_TABLET | Freq: Two times a day (BID) | ORAL | Status: DC

## 2012-07-19 NOTE — Progress Notes (Signed)
  Subjective:    Patient ID: [name], female    DOB: 30-Dec-1927, 79 y.o.   MRN: [id number]  HPI Cough- pt reports cough and nasal congestion.  sxs started 5 days ago.  Sleeping well.  + chills.  No fever.  Cough is productive of clear sputum.  Nasal congestion is yellow.  + sinus pain/pressure.  + hoarseness.  Bilateral ear itching.  + fatigue.   Review of Systems For ROS see HPI     Objective:   Physical Exam  Constitutional: She appears well-developed and well-nourished. No distress.  HENT:  Head: Normocephalic and atraumatic.  Right Ear: Tympanic membrane normal.  Left Ear: Tympanic membrane normal.  Nose: Mucosal edema and rhinorrhea present. Right sinus exhibits maxillary sinus tenderness. Right sinus exhibits no frontal sinus tenderness. Left sinus exhibits maxillary sinus tenderness. Left sinus exhibits no frontal sinus tenderness.  Mouth/Throat: Uvula is midline and mucous membranes are normal. Posterior oropharyngeal erythema present. No oropharyngeal exudate.  Eyes: Conjunctivae and EOM are normal. Pupils are equal, round, and reactive to light.  Neck: Normal range of motion. Neck supple.  Pulmonary/Chest: Effort normal and breath sounds normal. No respiratory distress. She has no wheezes.  Lymphadenopathy:    She has no cervical adenopathy.          Assessment & Plan:

## 2012-07-19 NOTE — Patient Instructions (Addendum)
Start the Doxycycline twice daily for the sinus infection Follow up to check your coumadin level w/ your cardiologist- antibiotics can change your levels Drink plenty of fluids Use Delsym (over the counter) for cough REST! It is ok to use tylenol Call with any questions or concerns Hang in there!!!

## 2012-07-19 NOTE — Assessment & Plan Note (Signed)
New.  Pt's sxs and PE consistent w/ infxn.  Start abx.  Reviewed supportive care and red flags that should prompt return.  Pt expressed understanding and is in agreement w/ plan.  

## 2012-07-22 ENCOUNTER — Ambulatory Visit (INDEPENDENT_AMBULATORY_CARE_PROVIDER_SITE_OTHER): Payer: Medicare Other | Admitting: General Surgery

## 2012-07-25 ENCOUNTER — Emergency Department (HOSPITAL_COMMUNITY)
Admission: EM | Admit: 2012-07-25 | Discharge: 2012-07-25 | Disposition: A | Discharge status: Home / Self Care | Payer: Medicare Other | Attending: Emergency Medicine | Admitting: Emergency Medicine

## 2012-07-25 ENCOUNTER — Emergency Department (HOSPITAL_COMMUNITY): Payer: Medicare Other

## 2012-07-25 ENCOUNTER — Encounter (HOSPITAL_COMMUNITY): Payer: Self-pay | Admitting: Physical Medicine and Rehabilitation

## 2012-07-25 DIAGNOSIS — G20 Parkinson's disease: Secondary | ICD-10-CM | POA: Insufficient documentation

## 2012-07-25 DIAGNOSIS — Z79899 Other long term (current) drug therapy: Secondary | ICD-10-CM | POA: Insufficient documentation

## 2012-07-25 DIAGNOSIS — R791 Abnormal coagulation profile: Secondary | ICD-10-CM | POA: Insufficient documentation

## 2012-07-25 DIAGNOSIS — I959 Hypotension, unspecified: Secondary | ICD-10-CM | POA: Diagnosis not present

## 2012-07-25 DIAGNOSIS — F411 Generalized anxiety disorder: Secondary | ICD-10-CM | POA: Diagnosis not present

## 2012-07-25 DIAGNOSIS — G20A1 Parkinson's disease without dyskinesia, without mention of fluctuations: Secondary | ICD-10-CM | POA: Insufficient documentation

## 2012-07-25 DIAGNOSIS — Z7901 Long term (current) use of anticoagulants: Secondary | ICD-10-CM | POA: Insufficient documentation

## 2012-07-25 DIAGNOSIS — Z7982 Long term (current) use of aspirin: Secondary | ICD-10-CM | POA: Insufficient documentation

## 2012-07-25 DIAGNOSIS — I10 Essential (primary) hypertension: Secondary | ICD-10-CM | POA: Diagnosis not present

## 2012-07-25 DIAGNOSIS — R002 Palpitations: Secondary | ICD-10-CM | POA: Diagnosis not present

## 2012-07-25 DIAGNOSIS — F3289 Other specified depressive episodes: Secondary | ICD-10-CM | POA: Insufficient documentation

## 2012-07-25 DIAGNOSIS — Z87891 Personal history of nicotine dependence: Secondary | ICD-10-CM | POA: Diagnosis not present

## 2012-07-25 DIAGNOSIS — Z860100 Personal history of colon polyps, unspecified: Secondary | ICD-10-CM | POA: Insufficient documentation

## 2012-07-25 DIAGNOSIS — I4891 Unspecified atrial fibrillation: Secondary | ICD-10-CM | POA: Diagnosis not present

## 2012-07-25 DIAGNOSIS — F329 Major depressive disorder, single episode, unspecified: Secondary | ICD-10-CM | POA: Insufficient documentation

## 2012-07-25 DIAGNOSIS — R42 Dizziness and giddiness: Secondary | ICD-10-CM | POA: Diagnosis not present

## 2012-07-25 DIAGNOSIS — Z8739 Personal history of other diseases of the musculoskeletal system and connective tissue: Secondary | ICD-10-CM | POA: Insufficient documentation

## 2012-07-25 DIAGNOSIS — Z8719 Personal history of other diseases of the digestive system: Secondary | ICD-10-CM | POA: Diagnosis not present

## 2012-07-25 DIAGNOSIS — E785 Hyperlipidemia, unspecified: Secondary | ICD-10-CM | POA: Insufficient documentation

## 2012-07-25 DIAGNOSIS — R5381 Other malaise: Secondary | ICD-10-CM | POA: Diagnosis not present

## 2012-07-25 DIAGNOSIS — R5383 Other fatigue: Secondary | ICD-10-CM | POA: Diagnosis not present

## 2012-07-25 LAB — CBC WITH DIFFERENTIAL/PLATELET
Basophils Absolute: 0 10*3/uL (ref 0.0–0.1)
Lymphocytes Relative: 29 % (ref 12–46)
Lymphs Abs: 2 10*3/uL (ref 0.7–4.0)
Neutro Abs: 4.2 10*3/uL (ref 1.7–7.7)
Neutrophils Relative %: 60 % (ref 43–77)
Platelets: 254 10*3/uL (ref 150–400)
RBC: 4.32 MIL/uL (ref 3.87–5.11)
WBC: 7.1 10*3/uL (ref 4.0–10.5)

## 2012-07-25 LAB — BASIC METABOLIC PANEL
CO2: 28 mEq/L (ref 19–32)
Calcium: 9.9 mg/dL (ref 8.4–10.5)
GFR calc Af Amer: 71 mL/min — ABNORMAL LOW (ref >90)
GFR calc non Af Amer: 61 mL/min — ABNORMAL LOW (ref >90)
Sodium: 133 mEq/L — ABNORMAL LOW (ref 135–145)

## 2012-07-25 LAB — PROTIME-INR
INR: 4.54 — ABNORMAL HIGH (ref 0.00–1.49)
Prothrombin Time: 40.2 seconds — ABNORMAL HIGH (ref 11.6–15.2)

## 2012-07-25 LAB — POCT I-STAT, CHEM 8
Calcium, Ion: 1.24 mmol/L (ref 1.13–1.30)
Chloride: 98 mEq/L (ref 96–112)
HCT: 41 % (ref 36.0–46.0)

## 2012-07-25 LAB — POCT I-STAT TROPONIN I: Troponin i, poc: 0 ng/mL (ref 0.00–0.08)

## 2012-07-25 NOTE — ED Notes (Signed)
Pt given [place] sandwich and a Sprite.

## 2012-07-25 NOTE — ED Notes (Signed)
MD at bedside. Dr. Ignacia Palma.

## 2012-07-25 NOTE — ED Notes (Signed)
PT ambulated with baseline gait; VSS; A&Ox3; no signs of distress; respirations even and unlabored; skin warm and dry; no questions upon discharge.  

## 2012-07-25 NOTE — ED Notes (Signed)
Patient transported to X-ray 

## 2012-07-25 NOTE — ED Notes (Signed)
Since yesterday morning pt was feeling like her heart was racing. Went away. Shortly after waking up this AM pt began having the feeling of her heart racing. Endorses dizziness, blurred vision, headache, chills, shortness of breath.

## 2012-07-25 NOTE — ED Provider Notes (Signed)
History     CSN: [id number]  Arrival date & time 07/25/12  1017   First MD Initiated Contact with Patient 07/25/12 1027      Chief Complaint  Patient presents with  . Atrial Fibrillation  . Dizziness  . Hypotension    (Consider location/radiation/quality/duration/timing/severity/associated sxs/prior treatment) HPI Comments: Patient is an 79 year old woman who has had atrial fibrillation for several years. She was placed on warfarin on 07/10/2012. She was also placed on propanolol and on 07/17/2012. Last weekend she had a sinus infection was placed on doxycycline. This morning she had very rapid heart rate. She called her cardiologist, Yates Decamp M.D. advised ED visit.  Patient is a 79 y.o. female presenting with palpitations. The history is provided by the patient and medical records. No language interpreter was used.  Palpitations  This is a new problem. The current episode started 3 to 5 hours ago. The problem occurs constantly. The problem has been gradually improving. Associated with: No specific stimulus. On average, each episode lasts 2 hours. Associated symptoms include malaise/fatigue and irregular heartbeat. Pertinent negatives include no fever. She has tried nothing for the symptoms. Heart disease: known history of atrial fibrillation.    Past Medical History  Diagnosis Date  . Arthritis   . Hyperlipidemia   . 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 Chronic constipation   . Hemorrhoid   . Varicose veins   . Anxiety   . Depression   . GERD (gastroesophageal reflux disease)   . Atrial fibrillation   . Recurrent UTI   . Parkinson disease   . Cardiac arrhythmia due to congenital heart disease     Past Surgical History  Procedure Laterality Date  . Appendectomy    . Ileal resection  03/2007    Due to obstruction    Family History  Problem Relation Age of Onset  . Asthma Brother   . Alcohol abuse Brother   . Throat cancer Father   . Alcohol abuse Father   .  Lupus Daughter   . Bipolar disorder Daughter   . Lupus Daughter   . Anxiety disorder Daughter   . Alcohol abuse Sister   . Alcohol abuse Maternal Grandfather   . Alcohol abuse Paternal Grandmother     History  Substance Use Topics  . Smoking status: Former Smoker -- 2.00 packs/day for 30 years    Types: Cigarettes    Quit date: 04/21/1980  . Smokeless tobacco: Never Used     Comment: onset age 23 -30, up to > 1ppd (almost 2 ppd)  . Alcohol Use: No    OB History   Grav Para Term Preterm Abortions TAB SAB Ect Mult Living                  Review of Systems  Constitutional: Positive for malaise/fatigue. Negative for fever and chills.  HENT: Negative.   Eyes: Negative.   Respiratory: Negative.   Cardiovascular: Positive for palpitations.  Gastrointestinal: Negative.   Genitourinary: Negative.   Musculoskeletal: Negative.   Skin: Negative.   Neurological: Negative.   Psychiatric/Behavioral: Negative.     Allergies  Adhesive; Amoxicillin; Ciprofloxacin; and Ultram  Home Medications   Current Outpatient Rx  Name  Route  Sig  Dispense  Refill  . albuterol (VENTOLIN HFA) 108 (90 BASE) MCG/ACT inhaler   Inhalation   Inhale 2 puffs into the lungs 4 (four) times daily as needed. For shortness of breath         . aspirin  81 MG tablet   Oral   Take 81 mg by mouth daily.          . beclomethasone (QVAR) 80 MCG/ACT inhaler   Inhalation   Inhale 2 puffs into the lungs 2 (two) times daily.   1 Inhaler   6   . Calcium Carbonate-Vitamin D (CALCIUM 600+D) 600-400 MG-UNIT per tablet   Oral   Take 1 tablet by mouth 2 (two) times daily.           . Cranberry-Vitamin C-Vitamin E (CRANBERRY SUPER STRENGTH) 6000-100-3 MG-MG-UNIT CAPS   Oral   Take 1 capsule by mouth every morning.           Marland Kitchen dextromethorphan (DELSYM) 30 MG/5ML liquid   Oral   Take 10 mLs (60 mg total) by mouth as needed for cough.   89 mL   0   . digoxin (LANOXIN) 0.125 MG tablet   Oral   Take  125 mcg by mouth daily.           Marland Kitchen doxycycline (VIBRA-TABS) 100 MG tablet   Oral   Take 1 tablet (100 mg total) by mouth 2 (two) times daily.   20 tablet   0   . fish oil-omega-3 fatty acids 1000 MG capsule   Oral   Take 1 g by mouth every morning.          . flecainide (TAMBOCOR) 50 MG tablet      25 mg 2 (two) times daily.          . mometasone-formoterol (DULERA) 100-5 MCG/ACT AERO   Inhalation   Inhale 2 puffs into the lungs 2 (two) times daily.         . Multiple Vitamin (MULTIVITAMIN WITH MINERALS) TABS   Oral   Take 1 tablet by mouth daily.         . Polyethylene POWD   Does not apply   by Does not apply route. Take 1 capful daily         . propafenone (RYTHMOL) 150 MG tablet   Oral   Take 150 mg by mouth 3 (three) times daily.         . sertraline (ZOLOFT) 100 MG tablet   Oral   Take 1 tablet (100 mg total) by mouth daily.   90 tablet   0   . warfarin (COUMADIN) 2.5 MG tablet   Oral   Take 2.5 mg by mouth as directed.           BP 89/71  Pulse 68  Temp(Src) 97.6 F (36.4 C) (Oral)  Resp 16  SpO2 95%  Physical Exam  Nursing note and vitals reviewed. Constitutional: She is oriented to person, place, and time.  Pleasant elderly lady in no distress at rest.  HENT:  Head: Normocephalic and atraumatic.  Right Ear: External ear normal.  Left Ear: External ear normal.  Mouth/Throat: Oropharynx is clear and moist.  Eyes: Conjunctivae and EOM are normal. Pupils are equal, round, and reactive to light.  Neck: Normal range of motion. Neck supple.  No carotid bruit.  Cardiovascular: Normal rate, regular rhythm and normal heart sounds.   Pulmonary/Chest: Effort normal and breath sounds normal.  Abdominal: Soft. Bowel sounds are normal.  Musculoskeletal: Normal range of motion. She exhibits no edema and no tenderness.  Neurological: She is alert and oriented to person, place, and time.  No sensory or motor deficit.  Skin: Skin is warm and  dry.  Psychiatric: She has  a normal mood and affect. Her behavior is normal.    ED Course  Procedures (including critical care time)  Labs Reviewed  BASIC METABOLIC PANEL  CBC WITH DIFFERENTIAL  PROTIME-INR  APTT    Date: 07/25/2012  Rate: 64  Rhythm: normal sinus rhythm  QRS Axis: normal  Intervals: normal  ST/T Wave abnormalities: nonspecific ST changes  Conduction Disutrbances:none  Narrative Interpretation: Abnormal EKG  Old EKG Reviewed: unchanged  11:01 AM Pt was seen and had physical examination.  EKG was normal.  Lab workup ordered.  12:49 PM Results for orders placed during the hospital encounter of 07/25/12  BASIC METABOLIC PANEL      Result Value Range   Sodium 133 (*) 135 - 145 mEq/L   Potassium 4.7  3.5 - 5.1 mEq/L   Chloride 96  96 - 112 mEq/L   CO2 28  19 - 32 mEq/L   Glucose, Bld 77  70 - 99 mg/dL   BUN 19  6 - 23 mg/dL   Creatinine, Ser 1.61  0.50 - 1.10 mg/dL   Calcium 9.9  8.4 - 09.6 mg/dL   GFR calc non Af Amer 61 (*) >90 mL/min   GFR calc Af Amer 71 (*) >90 mL/min  CBC WITH DIFFERENTIAL      Result Value Range   WBC 7.1  4.0 - 10.5 K/uL   RBC 4.32  3.87 - 5.11 MIL/uL   Hemoglobin 13.2  12.0 - 15.0 g/dL   HCT 04.5  40.9 - 81.1 %   MCV 88.9  78.0 - 100.0 fL   MCH 30.6  26.0 - 34.0 pg   MCHC 34.4  30.0 - 36.0 g/dL   RDW 91.4  78.2 - 95.6 %   Platelets 254  150 - 400 K/uL   Neutrophils Relative 60  43 - 77 %   Neutro Abs 4.2  1.7 - 7.7 K/uL   Lymphocytes Relative 29  12 - 46 %   Lymphs Abs 2.0  0.7 - 4.0 K/uL   Monocytes Relative 10  3 - 12 %   Monocytes Absolute 0.7  0.1 - 1.0 K/uL   Eosinophils Relative 2  0 - 5 %   Eosinophils Absolute 0.1  0.0 - 0.7 K/uL   Basophils Relative 0  0 - 1 %   Basophils Absolute 0.0  0.0 - 0.1 K/uL  PROTIME-INR      Result Value Range   Prothrombin Time 40.2 (*) 11.6 - 15.2 seconds   INR 4.54 (*) 0.00 - 1.49  APTT      Result Value Range   aPTT 56 (*) 24 - 37 seconds  POCT I-STAT, CHEM 8      Result  Value Range   Sodium 135  135 - 145 mEq/L   Potassium 4.7  3.5 - 5.1 mEq/L   Chloride 98  96 - 112 mEq/L   BUN 19  6 - 23 mg/dL   Creatinine, Ser 2.13  0.50 - 1.10 mg/dL   Glucose, Bld 77  70 - 99 mg/dL   Calcium, Ion 0.86  5.78 - 1.30 mmol/L   TCO2 30  0 - 100 mmol/L   Hemoglobin 13.9  12.0 - 15.0 g/dL   HCT 46.9  62.9 - 52.8 %  POCT I-STAT TROPONIN I      Result Value Range   Troponin i, poc 0.00  0.00 - 0.08 ng/mL   Comment 3            Dg  Chest 2 View  07/25/2012  *RADIOLOGY REPORT*  Clinical Data: Atrial fibrillation, dizziness, hypertension.  CHEST - 2 VIEW  Comparison: 03/24/2012  Findings: Lungs hyperinflated with coarse   perihilar and bibasilar bronchovascular markings.  No confluent airspace infiltrate or overt edema.  No effusion.  Heart size upper limits normal. Atheromatous aortic arch.  Stable compression deformity near the thoracolumbar junction.  IMPRESSION:  1.  Hyperinflation without acute or superimposed abnormality.   Original Report Authenticated By: D. Andria Rhein, MD    12:50 PM Lab workup is reassuringly negative.  Call to Dr. Yates Decamp -->  1:51 PM Dr. Jacinto Halim recommended that pt hold warfarin tonight and tomorrow.  She can decide whether or not to take propafenone; it is not as important as she is not in atrial fibrillation at present.   1. Supratherapeutic INR            Carleene Cooper III, MD 07/25/12 1355

## 2012-07-25 NOTE — ED Notes (Signed)
Pt presents to department for evaluation of rapid heart rate. Pt states she feels like heart is racing. Also states dizziness, blurred vision and generalized weakness. History of atrial fibrillation. Denies chest pain. Pt is alert and oriented x4.

## 2012-07-26 ENCOUNTER — Ambulatory Visit (HOSPITAL_COMMUNITY): Payer: Self-pay | Admitting: Licensed Clinical Social Worker

## 2012-07-26 ENCOUNTER — Ambulatory Visit (INDEPENDENT_AMBULATORY_CARE_PROVIDER_SITE_OTHER): Payer: Medicare Other | Admitting: Licensed Clinical Social Worker

## 2012-07-26 DIAGNOSIS — F411 Generalized anxiety disorder: Secondary | ICD-10-CM

## 2012-07-26 NOTE — Progress Notes (Signed)
   THERAPIST PROGRESS NOTE  Session Time: 3:00pm-3:50pm  Participation Level: Active  Behavioral Response: Well GroomedAlertAnxious  Type of Therapy: Individual Therapy  Treatment Goals addressed: Coping  Interventions: CBT, DBT, Strength-based, Supportive and Reframing  Summary: [name] is a 79 y.o. female who presents with anxious mood and bright affect. She reports a frustrating week due to feeling sick with continued side pain and exhaustion. She went to the ED and her warafin dose was too high. She reports feeling much better but is anxious about this medication and fears that it can kill her. She remains anxious about her life in general. She is having more difficulty completing tasks which require focus. She can't balance her check book and "lost" $3,000 dollars somewhere. She is willing to allow her daughter to take over her finances. She also needs help grocery shopping and carrying bags. She has asked her neighbor to help her and is very focused on planning for help she may need. Her sleep has been increased and her appetite is wnl.   Suicidal/Homicidal: Nowithout intent/plan  Therapist Response: Assessed patients current functioning and reviewed progress. Reviewed coping strategies. Assessed patients safety and assisted in identifying protective factors.  Reviewed crisis plan with patient. Assisted patient with the expression of anxiety and fear. Reviewed patients self care plan. Assessed progress related to self care. Patients self care is good. Recommend daily exercise, increased socialization and recreation. Used CBT to assist patient with the identification of negative distortions and irrational thoughts. Encouraged patient to verbalize alternative and factual responses which challenge thought distortions. Reviewed healthy boundaries and assertive communication. Patient continues to remain very active in her community. She is making progress related to her willingness to  ask for help. Her self doubt remains.   Plan: Return again in two weeks.  Diagnosis: Axis I: Generalized Anxiety Disorder    Axis II: No diagnosis    Rochester Serpe, LCSW 07/26/2012

## 2012-07-28 DIAGNOSIS — M25559 Pain in unspecified hip: Secondary | ICD-10-CM | POA: Diagnosis not present

## 2012-07-28 DIAGNOSIS — M62838 Other muscle spasm: Secondary | ICD-10-CM | POA: Diagnosis not present

## 2012-08-02 ENCOUNTER — Ambulatory Visit (HOSPITAL_COMMUNITY): Payer: Self-pay | Admitting: Licensed Clinical Social Worker

## 2012-08-04 DIAGNOSIS — M79609 Pain in unspecified limb: Secondary | ICD-10-CM | POA: Diagnosis not present

## 2012-08-04 DIAGNOSIS — B351 Tinea unguium: Secondary | ICD-10-CM | POA: Diagnosis not present

## 2012-08-09 ENCOUNTER — Ambulatory Visit (HOSPITAL_COMMUNITY): Payer: Self-pay | Admitting: Psychiatry

## 2012-08-10 ENCOUNTER — Ambulatory Visit (INDEPENDENT_AMBULATORY_CARE_PROVIDER_SITE_OTHER): Payer: Medicare Other | Admitting: Licensed Clinical Social Worker

## 2012-08-10 DIAGNOSIS — F411 Generalized anxiety disorder: Secondary | ICD-10-CM | POA: Diagnosis not present

## 2012-08-10 NOTE — Progress Notes (Signed)
   THERAPIST PROGRESS NOTE  Session Time: 9:30am-10:20am  Participation Level: Active  Behavioral Response: Well GroomedAlertAnxious and Euthymic  Type of Therapy: Individual Therapy  Treatment Goals addressed: Coping  Interventions: CBT, Strength-based, Supportive and Reframing  Summary: [name] is a 79 y.o. female who presents with euthymic mood and anxious affect. She is dressed in her work out clothes and is excited to be exercising again. She continues to experience flank pain which she is bothered by and frustrated that no doctor can determine the source of her pain. She is determined to figure this out and has scheduled an appointment with her OBGYN. She is very involved socially and does express some frustration with her friends and negative comments. She is influenced by these comments and questions her self worth as a result. Her sleep and appetite are wnl.    Suicidal/Homicidal: Nowithout intent/plan  Therapist Response: Assessed patients current functioning and reviewed progress. Reviewed coping strategies. Assessed patients safety and assisted in identifying protective factors.  Reviewed crisis plan with patient. Assisted patient with the expression of frustration and anxiety. Reviewed patients self care plan. Assessed progress related to self care. Patients self care is good. Recommend daily exercise, increased socialization and recreation. Reviewed healthy boundaries and assertive communication. Used CBT to assist patient with the identification of negative distortions and irrational thoughts. Encouraged patient to verbalize alternative and factual responses which challenge thought distortions.   Plan: Return again in two weeks.  Diagnosis: Axis I: Generalized Anxiety Disorder    Axis II: No diagnosis    Catherine Bonaparte, LCSW 08/10/2012

## 2012-08-11 DIAGNOSIS — Z7901 Long term (current) use of anticoagulants: Secondary | ICD-10-CM | POA: Diagnosis not present

## 2012-08-16 ENCOUNTER — Ambulatory Visit (HOSPITAL_COMMUNITY): Payer: Self-pay | Admitting: Licensed Clinical Social Worker

## 2012-08-17 ENCOUNTER — Ambulatory Visit (HOSPITAL_COMMUNITY): Payer: Self-pay | Admitting: Psychiatry

## 2012-08-17 ENCOUNTER — Ambulatory Visit (INDEPENDENT_AMBULATORY_CARE_PROVIDER_SITE_OTHER): Payer: Medicare Other | Admitting: Psychiatry

## 2012-08-17 ENCOUNTER — Encounter (HOSPITAL_COMMUNITY): Payer: Self-pay | Admitting: Psychiatry

## 2012-08-17 VITALS — BP 114/69 | HR 60 | Ht 71.0 in | Wt 155.8 lb

## 2012-08-17 DIAGNOSIS — F411 Generalized anxiety disorder: Secondary | ICD-10-CM | POA: Diagnosis not present

## 2012-08-17 DIAGNOSIS — F419 Anxiety disorder, unspecified: Secondary | ICD-10-CM

## 2012-08-17 MED ORDER — SERTRALINE HCL 100 MG PO TABS: 100.0000 mg | ORAL_TABLET | Freq: Every day | ORAL | Status: DC

## 2012-08-17 NOTE — Progress Notes (Signed)
[facility] [id number] Progress Note  [name] [id number] 79 y.o.  08/17/2012 10:37 AM  Chief Complaint:  I have a lot of anxiety but I believe Zoloft is helping me.    History of Present Illness:  Patient is 79 year old Caucasian female who came for her followup appointment.  Patient continued to endorse multiple physical symptoms.  Recently she visited the emergency room due to blood pressure and dizziness.  Her antiarrhythmic medications are changed.  She is also taking warfarin.  She is very anxious about her physical health and medication.  She is taking multiple medications and she scheduled to see multiple doctors.  However she sleeping better.  She denies any recent agitation anger or severe mood swing.  She denies any recent panic attack.  She is going to physical therapy 3 times a week which is also helping her anxiety. She feels sometimes lack of energy fatigue and chronic back pain.  However she's not taking any narcotic pain medication.  She is not drinking or using any illegal substance.  She denies any crying spells or any hallucination.  Suicidal Ideation: No Plan Formed: No Patient has means to carry out plan: No  Homicidal Ideation: No Plan Formed: No Patient has means to carry out plan: No  Review of Systems  Constitutional: Positive for malaise/fatigue.  HENT: Negative.   Cardiovascular: Positive for palpitations.  Musculoskeletal: Positive for back pain and joint pain.  Psychiatric/Behavioral: The patient is nervous/anxious.     Psychiatric: Agitation: No Hallucination: No Depressed Mood: No Insomnia: No Hypersomnia: No Altered Concentration: No Feels Worthless: No Grandiose Ideas: No Belief In Special Powers: No New/Increased Substance Abuse: No Compulsions: No  Neurologic: Headache: Yes Seizure: No Paresthesias: No  Past Medical Family, Social History: Patient has history of arthritis, hyperlipidemia, hemorrhoid, chronic  constipation, GERD, recurrent UTI, Parkinson disease, chronic arrhythmia.  She see physician at [facility].  Patient has 2 daughter.  Her parents are deceased.  Outpatient Encounter Prescriptions as of 08/17/2012  Medication Sig Dispense Refill  . albuterol (VENTOLIN HFA) 108 (90 BASE) MCG/ACT inhaler Inhale 2 puffs into the lungs 4 (four) times daily as needed. For shortness of breath      . aspirin 81 MG tablet Take 81 mg by mouth daily.       . Calcium Carbonate-Vitamin D (CALCIUM 600+D) 600-400 MG-UNIT per tablet Take 1 tablet by mouth 2 (two) times daily.        . Cranberry-Vitamin C-Vitamin E (CRANBERRY SUPER STRENGTH) 6000-100-3 MG-MG-UNIT CAPS Take 1 capsule by mouth every morning.        . digoxin (LANOXIN) 0.125 MG tablet Take 125 mcg by mouth daily.        . fish oil-omega-3 fatty acids 1000 MG capsule Take 1 g by mouth every morning.       . Hypromellose (ARTIFICIAL TEARS OP) Apply 1 drop to eye 2 (two) times daily as needed (dry eye).      . Multiple Vitamin (MULTIVITAMIN WITH MINERALS) TABS Take 1 tablet by mouth daily.      . polyethylene glycol (MIRALAX / GLYCOLAX) packet Take 17 g by mouth daily as needed (constipation).      . propafenone (RYTHMOL) 150 MG tablet Take 150 mg by mouth 3 (three) times daily.      . sertraline (ZOLOFT) 100 MG tablet Take 1 tablet (100 mg total) by mouth daily.  90 tablet  0  . warfarin (COUMADIN) 2.5 MG tablet Take 5 mg by mouth at  bedtime.       . [DISCONTINUED] doxycycline (VIBRA-TABS) 100 MG tablet Take 1 tablet (100 mg total) by mouth 2 (two) times daily.  20 tablet  0  . [DISCONTINUED] sertraline (ZOLOFT) 100 MG tablet Take 1 tablet (100 mg total) by mouth daily.  90 tablet  0  . [DISCONTINUED] sertraline (ZOLOFT) 100 MG tablet Take 1 tablet (100 mg total) by mouth daily.  90 tablet  0   No facility-administered encounter medications on file as of 08/17/2012.    Past Psychiatric History/Hospitalization(s): Anxiety: Yes Bipolar Disorder:  No Depression: No Mania: No Psychosis: No Schizophrenia: No Personality Disorder: No Hospitalization for psychiatric illness: No History of Electroconvulsive Shock Therapy: No Prior Suicide Attempts: No  Physical Exam: Constitutional:  BP 114/69  Pulse 60  Ht 5\' 11"  (1.803 m)  Wt 155 lb 12.8 oz (70.67 kg)  BMI 21.74 kg/m2  General Appearance: alert, oriented, no acute distress and Casually dressed .  She is tall and thin woman.    Musculoskeletal: Strength & Muscle Tone: decreased Gait & Station: shuffle Patient leans: Front  Mental status examination Patient is a thin lean person who is well groomed casually dressed.  She is anxious but cooperative.  Her speech is fast with rapid volume and tone.  Her thought process is circumstantial.  There were no tremors or shakes present per her psychomotor activity is slightly increased.  She maintained fair eye contact.  Her attention and concentration is distracted at times.  She described her mood is anxious and her affect is mood appropriate.  She denies any auditory hallucination but admitted visual hallucination and seeing shadows.  She denies any paranoia or any delusion at this time.  There were no flight of ideas.  She's alert and oriented x3.  However she do not recall very well remote memory.  Her insight judgment and impulse control is okay.  Medical Decision Making (Choose Three): Established Problem, Stable/Improving (1), New Problem, with no additional work-up planned (3), Review of Last Therapy Session (1), Review or order medicine tests (1), Review of Medication Regimen & Side Effects (2) and Review of New Medication or Change in Dosage (2)  Assessment: Axis I: Anxiety disorder  Axis II: Deferred  Axis III: History of arthritis, hyperlipidemia, hemorrhoid, chronic constipation, GERD, recurrent UTI, Parkinson disease, chronic arrhythmia.  Recent back and shoulder pain.  Axis IV: Mild to moderate  Axis V: 60-65   Plan:   I review her symptoms, blood work which was done in the emergency room last month , normal CBC and basic chemistry .  At this time patient is fairly stable on Zoloft.  However she's been very busy seeing multiple physicians including pulmonology , cardiology and neurology.  Recently her antiarrhythmic medication this change.  However she's not happy about it and is scheduled to see her cardiologist this Friday.  I recommend to discuss the concern over the cardiologist before she stopped the medication.  Recommend to call us if she has any question or concern sugar was the symptom.  Patient is given.  Time spent 25 minutes.  More than 50% of the time spent and psychoeducation constant incorporation of care.  Followup in 3 months.  She will see therapist in his office briefly for counseling.  Baylynn Shifflett T., MD 08/17/2012

## 2012-08-20 DIAGNOSIS — I4891 Unspecified atrial fibrillation: Secondary | ICD-10-CM | POA: Diagnosis not present

## 2012-08-20 DIAGNOSIS — Z7901 Long term (current) use of anticoagulants: Secondary | ICD-10-CM | POA: Diagnosis not present

## 2012-08-25 DIAGNOSIS — Z01419 Encounter for gynecological examination (general) (routine) without abnormal findings: Secondary | ICD-10-CM | POA: Diagnosis not present

## 2012-08-25 DIAGNOSIS — Z124 Encounter for screening for malignant neoplasm of cervix: Secondary | ICD-10-CM | POA: Diagnosis not present

## 2012-08-27 DIAGNOSIS — H35359 Cystoid macular degeneration, unspecified eye: Secondary | ICD-10-CM | POA: Diagnosis not present

## 2012-08-30 ENCOUNTER — Ambulatory Visit (HOSPITAL_COMMUNITY): Payer: Self-pay | Admitting: Licensed Clinical Social Worker

## 2012-09-01 ENCOUNTER — Ambulatory Visit (INDEPENDENT_AMBULATORY_CARE_PROVIDER_SITE_OTHER): Payer: Medicare Other | Admitting: Licensed Clinical Social Worker

## 2012-09-01 DIAGNOSIS — F411 Generalized anxiety disorder: Secondary | ICD-10-CM

## 2012-09-01 NOTE — Progress Notes (Signed)
   THERAPIST PROGRESS NOTE  Session Time: 8:30am-9:20am  Participation Level: Active  Behavioral Response: Well GroomedAlertAnxious and Euthymic  Type of Therapy: Individual Therapy  Treatment Goals addressed: Coping  Interventions: CBT, Strength-based, Supportive and Reframing  Summary: [name] is a 79 y.o. female who presents with euthymic mood and bright affect. She reports doing well, is able to exercise and is happy about this because she believes this is the what keeps her happy and healthy. She is very busy and finds herself overbooked with social events. She continues to endorse anxiety related to her daughter, [name], who continues to ask for money. Patient is upset over this, feels helpless and worries about her daughter. She has been able to set firm boundaries lately and has not given her anymore money. She is, however, very concerned that her daughter, [name], continues to give [name] a lot of money. She expresses concern that [name] doesn't say no to her sister.  She expresses frustration that her friends don't talk to her or invite her to do things with them, as she always invites others to event. She questions herself and wonders if something is wrong with her. Her sleep and appetite are wnl.    Suicidal/Homicidal: Nowithout intent/plan  Therapist Response: Assessed patients current functioning and reviewed progress. Reviewed coping strategies. Assessed patients safety and assisted in identifying protective factors.  Reviewed crisis plan with patient. Assisted patient with the expression of anxiety. Reviewed patients self care plan. Assessed progress related to self care. Patients self care is excellent. Recommend daily exercise, increased socialization and recreation. Reviewed healthy boundaries and assertive communication. Used CBT to assist patient with the identification of negative distortions and irrational thoughts. Encouraged patient to verbalize alternative and  factual responses which challenge thought distortions. Patient struggles with self blame and self doubt. She needs redirection and affirmation. She has difficulty accepting that others are different than she is.   Plan: Return again in two weeks.  Diagnosis: Axis I: Generalized Anxiety Disorder    Axis II: No diagnosis    Catherine Klare, LCSW 09/01/2012

## 2012-09-21 DIAGNOSIS — Z7901 Long term (current) use of anticoagulants: Secondary | ICD-10-CM | POA: Diagnosis not present

## 2012-09-27 ENCOUNTER — Encounter (HOSPITAL_COMMUNITY): Payer: Self-pay | Admitting: Licensed Clinical Social Worker

## 2012-09-27 ENCOUNTER — Ambulatory Visit (HOSPITAL_COMMUNITY): Payer: Self-pay | Admitting: Licensed Clinical Social Worker

## 2012-09-27 NOTE — Progress Notes (Signed)
Patient ID: [name], female   DOB: 07/09/1927, 79 y.o.   MRN: [id number] Patient cancelled late due to pain.

## 2012-10-09 DIAGNOSIS — M1710 Unilateral primary osteoarthritis, unspecified knee: Secondary | ICD-10-CM | POA: Diagnosis not present

## 2012-10-09 DIAGNOSIS — M169 Osteoarthritis of hip, unspecified: Secondary | ICD-10-CM | POA: Diagnosis not present

## 2012-10-13 ENCOUNTER — Ambulatory Visit (INDEPENDENT_AMBULATORY_CARE_PROVIDER_SITE_OTHER): Payer: Medicare Other | Admitting: Licensed Clinical Social Worker

## 2012-10-13 ENCOUNTER — Telehealth (HOSPITAL_COMMUNITY): Payer: Self-pay

## 2012-10-13 DIAGNOSIS — F411 Generalized anxiety disorder: Secondary | ICD-10-CM | POA: Diagnosis not present

## 2012-10-13 NOTE — Progress Notes (Signed)
   THERAPIST PROGRESS NOTE  Session Time: 2:00pm-2:50pm  Participation Level: Active  Behavioral Response: Well GroomedAlertEuthymic  Type of Therapy: Individual Therapy  Treatment Goals addressed: Coping  Interventions: CBT, Strength-based, Supportive and Reframing  Summary: [name] is a 79 y.o. female who presents with euthymic mood and bright affect. She reports that she has been doing well, with well controlled anxiety. She has had trouble with back pain and is now taking Celebrex which is helping. She recently had a visit with her daughter and family and enjoyed this greatly.  She continues to struggle with anxiety related to her daughter [name] and reports that she is not talking to her because she cursed at her and hurt patients feelings. She has made progress related to setting firmer limits with her daughter and is no longer giving her money. She questions how much assistance she needs at home and how to find someone to be a patient advocate for her. Her sleep and appetite are wnl.   Suicidal/Homicidal: Nowithout intent/plan  Therapist Response: Assessed patients current functioning and reviewed progress. Reviewed coping strategies. Assessed patients safety and assisted in identifying protective factors.  Reviewed crisis plan with patient. Assisted patient with the expression of anxiety. Reviewed patients self care plan. Assessed progress related to self care. Patients self care is very good. Recommend daily exercise, increased socialization and recreation. Reviewed healthy boundaries and assertive communication. Used CBT to assist patient with the identification of negative distortions and irrational thoughts. Encouraged patient to verbalize alternative and factual responses which challenge thought distortions.   Plan: Return again in two weeks.  Diagnosis: Axis I: Generalized Anxiety Disorder    Axis II: No diagnosis    Levy Cedano, LCSW 10/13/2012

## 2012-10-18 ENCOUNTER — Ambulatory Visit (HOSPITAL_COMMUNITY): Payer: Self-pay | Admitting: Licensed Clinical Social Worker

## 2012-10-19 ENCOUNTER — Ambulatory Visit (HOSPITAL_COMMUNITY): Payer: Self-pay | Admitting: Licensed Clinical Social Worker

## 2012-10-19 DIAGNOSIS — Z7901 Long term (current) use of anticoagulants: Secondary | ICD-10-CM | POA: Diagnosis not present

## 2012-10-27 DIAGNOSIS — B351 Tinea unguium: Secondary | ICD-10-CM

## 2012-10-27 DIAGNOSIS — M79609 Pain in unspecified limb: Secondary | ICD-10-CM | POA: Diagnosis not present

## 2012-10-27 HISTORY — DX: Tinea unguium: B35.1

## 2012-10-28 ENCOUNTER — Encounter: Payer: Self-pay | Admitting: Family Medicine

## 2012-11-03 ENCOUNTER — Ambulatory Visit (INDEPENDENT_AMBULATORY_CARE_PROVIDER_SITE_OTHER): Payer: Medicare Other | Admitting: Licensed Clinical Social Worker

## 2012-11-03 DIAGNOSIS — F411 Generalized anxiety disorder: Secondary | ICD-10-CM | POA: Diagnosis not present

## 2012-11-03 NOTE — Progress Notes (Signed)
   THERAPIST PROGRESS NOTE  Session Time: 8:30am-9:20am  Participation Level: Active  Behavioral Response: Well GroomedAlertAnxious  Type of Therapy: Individual Therapy  Treatment Goals addressed: Coping  Interventions: CBT, Strength-based, Supportive and Reframing  Summary: [name] is a 79 y.o. female who presents with anxious mood and affect. She reports ongoing anxiety related to her health and making the right decisions regarding her health. She endorses self doubt and that she is easily influenced by her friends when they give her negative or contradictory advice. She discusses dental work she is scheduled to have done today and her fear about choosing the right dentist. She remains very active with friends, but expresses frustration that she needs approval from others and that she will return to friends who have hurt her. She struggles to redirect her negative thoughts and needs frequent redirection. Her sleep and appetite are wnl.    Suicidal/Homicidal: Nowithout intent/plan  Therapist Response: Assessed patients current functioning and reviewed progress. Reviewed coping strategies. Assessed patients safety and assisted in identifying protective factors.  Reviewed crisis plan with patient. Assisted patient with the expression of anxiety. Reviewed patients self care plan. Assessed progress related to self care. Patients self care is good. Recommend daily exercise, increased socialization and recreation. Reviewed healthy boundaries and assertive communication. Used CBT to assist patient with the identification of negative distortions and irrational thoughts. Encouraged patient to verbalize alternative and factual responses which challenge thought distortions.   Plan: Return again in two weeks.  Diagnosis: Axis I: Generalized Anxiety Disorder    Axis II: No diagnosis    Cherylann Hobday, LCSW 11/03/2012

## 2012-11-11 DIAGNOSIS — M25559 Pain in unspecified hip: Secondary | ICD-10-CM | POA: Diagnosis not present

## 2012-11-16 ENCOUNTER — Ambulatory Visit (HOSPITAL_COMMUNITY): Payer: Self-pay | Admitting: Psychiatry

## 2012-11-18 ENCOUNTER — Ambulatory Visit (INDEPENDENT_AMBULATORY_CARE_PROVIDER_SITE_OTHER): Payer: Medicare Other | Admitting: Licensed Clinical Social Worker

## 2012-11-18 DIAGNOSIS — F411 Generalized anxiety disorder: Secondary | ICD-10-CM

## 2012-11-18 NOTE — Progress Notes (Signed)
   THERAPIST PROGRESS NOTE  Session Time: 8:30am-9:20am  Participation Level: Active  Behavioral Response: Well GroomedAlertAnxious  Type of Therapy: Individual Therapy  Treatment Goals addressed: Coping  Interventions: CBT, Strength-based, Supportive and Reframing  Summary: [name] is a 79 y.o. female who presents with anxious mood and bright affect. She reports doing well since her last session but continues to experience generalized anxiety and is frustrated with herself. She wants to know why she is the way she is and why she has such self doubt. She is concerned about her finances and dental work which will cost her $7,000 dollars. She is overwhelmed when others give her feedback which is contrariy to her own choices. She is easily influenced by others opinions and feedback. She does not want to be influenced but finds it very difficult not to tell others about her plans and choices in life. She is pleased that her anxiety about her daughter [name] has improved. Her health is good and she continues to exercise. Her sleep and appetite are wnl.   Suicidal/Homicidal: Nowithout intent/plan  Therapist Response: Assessed patients current functioning and reviewed progress. Reviewed coping strategies. Assessed patients safety and assisted in identifying protective factors.  Reviewed crisis plan with patient. Assisted patient with the expression of anxiety. Reviewed patients self care plan. Assessed progress related to self care. Patients self care is good. Recommend daily exercise, increased socialization and recreation. Used CBT to assist patient with the identification of negative distortions and irrational thoughts. Encouraged patient to verbalize alternative and factual responses which challenge thought distortions. Used DBT to practice mindfulness, review distraction list and improve distress tolerance skills.   Plan: Return again in two weeks.  Diagnosis: Axis I: Generalized  Anxiety Disorder    Axis II: No diagnosis    Kenna Seward, LCSW 11/18/2012

## 2012-11-23 DIAGNOSIS — Z7901 Long term (current) use of anticoagulants: Secondary | ICD-10-CM | POA: Diagnosis not present

## 2012-11-24 ENCOUNTER — Other Ambulatory Visit: Payer: Self-pay

## 2012-11-24 ENCOUNTER — Ambulatory Visit (INDEPENDENT_AMBULATORY_CARE_PROVIDER_SITE_OTHER): Payer: Medicare Other | Admitting: Licensed Clinical Social Worker

## 2012-11-24 DIAGNOSIS — F411 Generalized anxiety disorder: Secondary | ICD-10-CM | POA: Diagnosis not present

## 2012-11-24 NOTE — Progress Notes (Signed)
   THERAPIST PROGRESS NOTE  Session Time: 8:30am-9:20am  Participation Level: Active  Behavioral Response: Well GroomedAlertAnxious  Type of Therapy: Individual Therapy  Treatment Goals addressed: Coping  Interventions: CBT, Strength-based, Supportive and Reframing  Summary: [name] is a 79 y.o. female who presents with euthymic mood and anxious affect. She is pleased that she is walking better and physically she feels better. She continues to process her anxiety and self doubt related to relationships and decision making. She endorses self doubt related her feelings and she continues to judge her feelings strongly. She demonstrates difficulty with the idea that she has a right to decide something different than her peers suggest. Overall, she is doing well and has increased her Zoloft back to 100mg  since she determined that it was not giving her diarrhea.  Her sleep and appetite are wnl.   Suicidal/Homicidal: Nowithout intent/plan  Therapist Response: Assessed patients current functioning and reviewed progress. Reviewed coping strategies. Assessed patients safety and assisted in identifying protective factors.  Reviewed crisis plan with patient. Assisted patient with the expression of anxeity. Reviewed patients self care plan. Assessed progress related to self care. Patients self care is good. Recommend daily exercise, increased socialization and recreation. Used CBT to assist patient with the identification of negative distortions and irrational thoughts. Encouraged patient to verbalize alternative and factual responses which challenge thought distortions. Reviewed healthy boundaries and assertive communication.   Plan: Return again in two weeks.  Diagnosis: Axis I: Generalized Anxiety Disorder    Axis II: No diagnosis    Yahia Bottger, LCSW 11/24/2012

## 2012-11-29 ENCOUNTER — Ambulatory Visit (HOSPITAL_COMMUNITY): Payer: Self-pay | Admitting: Psychiatry

## 2012-12-01 ENCOUNTER — Other Ambulatory Visit: Payer: Self-pay

## 2012-12-01 ENCOUNTER — Encounter: Payer: Self-pay | Admitting: Family Medicine

## 2012-12-01 ENCOUNTER — Ambulatory Visit (INDEPENDENT_AMBULATORY_CARE_PROVIDER_SITE_OTHER): Payer: Medicare Other | Admitting: Psychiatry

## 2012-12-01 ENCOUNTER — Encounter (HOSPITAL_COMMUNITY): Payer: Self-pay | Admitting: Psychiatry

## 2012-12-01 ENCOUNTER — Ambulatory Visit (INDEPENDENT_AMBULATORY_CARE_PROVIDER_SITE_OTHER): Payer: Medicare Other | Admitting: Family Medicine

## 2012-12-01 VITALS — BP 90/60 | HR 68

## 2012-12-01 VITALS — BP 98/70 | HR 61 | Temp 97.5°F | Ht 70.75 in | Wt 154.2 lb

## 2012-12-01 DIAGNOSIS — G20 Parkinson's disease: Secondary | ICD-10-CM

## 2012-12-01 DIAGNOSIS — F411 Generalized anxiety disorder: Secondary | ICD-10-CM

## 2012-12-01 DIAGNOSIS — IMO0002 Reserved for concepts with insufficient information to code with codable children: Secondary | ICD-10-CM

## 2012-12-01 DIAGNOSIS — Z1231 Encounter for screening mammogram for malignant neoplasm of breast: Secondary | ICD-10-CM

## 2012-12-01 DIAGNOSIS — Z0000 Encounter for general adult medical examination without abnormal findings: Secondary | ICD-10-CM

## 2012-12-01 DIAGNOSIS — Z136 Encounter for screening for cardiovascular disorders: Secondary | ICD-10-CM | POA: Diagnosis not present

## 2012-12-01 DIAGNOSIS — Z23 Encounter for immunization: Secondary | ICD-10-CM | POA: Diagnosis not present

## 2012-12-01 DIAGNOSIS — F419 Anxiety disorder, unspecified: Secondary | ICD-10-CM

## 2012-12-01 DIAGNOSIS — I4891 Unspecified atrial fibrillation: Secondary | ICD-10-CM | POA: Diagnosis not present

## 2012-12-01 DIAGNOSIS — Z13220 Encounter for screening for lipoid disorders: Secondary | ICD-10-CM

## 2012-12-01 LAB — BASIC METABOLIC PANEL
Chloride: 100 mEq/L (ref 96–112)
Potassium: 4.4 mEq/L (ref 3.5–5.1)

## 2012-12-01 LAB — HEPATIC FUNCTION PANEL
ALT: 12 U/L (ref 0–35)
AST: 20 U/L (ref 0–37)
Bilirubin, Direct: 0.1 mg/dL (ref 0.0–0.3)
Total Bilirubin: 0.5 mg/dL (ref 0.3–1.2)

## 2012-12-01 LAB — CBC WITH DIFFERENTIAL/PLATELET
Basophils Absolute: 0 10*3/uL (ref 0.0–0.1)
Eosinophils Absolute: 0.1 10*3/uL (ref 0.0–0.7)
Lymphocytes Relative: 21.4 % (ref 12.0–46.0)
MCHC: 32.9 g/dL (ref 30.0–36.0)
Neutrophils Relative %: 68.8 % (ref 43.0–77.0)
RBC: 4.09 Mil/uL (ref 3.87–5.11)
RDW: 13.7 % (ref 11.5–14.6)

## 2012-12-01 LAB — LIPID PANEL
LDL Cholesterol: 104 mg/dL — ABNORMAL HIGH (ref 0–99)
VLDL: 23.4 mg/dL (ref 0.0–40.0)

## 2012-12-01 MED ORDER — SERTRALINE HCL 100 MG PO TABS: 100.0000 mg | ORAL_TABLET | Freq: Every day | ORAL | Status: DC

## 2012-12-01 NOTE — Progress Notes (Signed)
  Subjective:    Patient ID: [name], female    DOB: April 09, 1928, 79 y.o.   MRN: [id number]  HPI Here today for CPE.  Risk Factors: Afib- chronic problem, following w/ cardiology regularly.  On Dig, coumadin. Parkinson's- chronic problem, following w/ Neuro.  'i'm in denial' Physical Activity:  Still walking but limited due to medical comorbidities, doing pool work outs Fall Risk: high risk due to Parkinson's, hx of falls Depression: very anxious, ongoing issue.  On Zoloft Hearing: decreased to conversational tones and whispered voice ADL's: independent Cognitive: some forgetfulness Home Safety: safe at home Height, Weight, BMI, Visual Acuity: see vitals, vision corrected to 20/20 w/ glasses Counseling: UTD on colonoscopy, DEXA, due for mammo. Labs Ordered: See A&P Care Plan: See A&P    Review of Systems Patient reports no vision/ hearing changes, adenopathy, fever, weight change,  persistant/recurrent hoarseness , swallowing issues, chest pain, palpitations, edema, persistant/recurrent cough, hemoptysis, dyspnea (rest/exertional/paroxysmal nocturnal), gastrointestinal bleeding (melena, rectal bleeding), abdominal pain, significant heartburn, bowel changes, GU symptoms (dysuria, hematuria, incontinence), Gyn symptoms (abnormal  bleeding, pain),  syncope, focal weakness, numbness & tingling, skin/hair/nail changes, abnormal bruising or bleeding.     Objective:   Physical Exam General Appearance:    Alert, cooperative, no distress, appears stated age  Head:    Normocephalic, without obvious abnormality, atraumatic  Eyes:    PERRL, conjunctiva/corneas clear, EOM's intact, fundi    benign, both eyes  Ears:    Normal TM's and external ear canals, both ears  Nose:   Nares normal, septum midline, mucosa normal, no drainage    or sinus tenderness  Throat:   Lips, mucosa, and tongue normal; teeth and gums normal  Neck:   Supple, symmetrical, trachea midline, no adenopathy;   Thyroid: no enlargement/tenderness/nodules  Back:     Symmetric, no curvature, ROM normal, no CVA tenderness  Lungs:     Clear to auscultation bilaterally, respirations unlabored  Chest Wall:    No tenderness or deformity   Heart:    Irregularly, irregular S1/S2  Breast Exam:    Deferred to mammo  Abdomen:     Soft, non-tender, bowel sounds active all four quadrants,    no masses, no organomegaly  Genitalia:    Deferred at pt's request  Rectal:    Extremities:   Extremities normal, atraumatic, no cyanosis or edema  Pulses:   2+ and symmetric all extremities  Skin:   Skin color, texture, turgor normal, no rashes or lesions  Lymph nodes:   Cervical, supraclavicular, and axillary nodes normal  Neurologic:   CNII-XII intact, normal strength, sensation and reflexes    throughout          Assessment & Plan:

## 2012-12-01 NOTE — Patient Instructions (Addendum)
Follow up in 6 months We'll notify you of your lab results and make any changes if needed Keep up the good work! Call if you need [place]! Enjoy the rest of your summer!

## 2012-12-01 NOTE — Progress Notes (Signed)
[facility] [id number] Progress Note  [name] [id number] 79 y.o.  12/01/2012 1:51 PM  Chief Complaint:  Medication management and followup.    History of Present Illness:  Patient is 79 year old Caucasian female who came for her followup appointment.  Patient is compliant with Zoloft 100 mg without any side effects.  She tried cutting down her Zoloft because she felt she is having diarrhea however she did not see any improvement with reducing the Zoloft.  She is taking Celebrex for her chronic pain.  She is keeping appointment with the multiple doctors because she has multiple somatic issues.  She admitted limited support system has cause increase in anxiety symptoms.  She is sleeping better but admits sometimes she has to wake up because she has to go the bathroom.  She is drinking at least 6-8 glasses of water every day.  She is going to gym 3 times a week.  She seeing therapist regularly.  She also continue Zoloft at present dose.  She denies any tremors or shakes but admitted nervousness and anxiety.  She is very reluctant to increase her dose of the medication.  She is not drinking or using any illegal substance.  She denies any crying spells or any hallucination.  Suicidal Ideation: No Plan Formed: No Patient has means to carry out plan: No  Homicidal Ideation: No Plan Formed: No Patient has means to carry out plan: No  Review of Systems  Constitutional: Positive for malaise/fatigue.  HENT: Negative.   Cardiovascular: Positive for palpitations.  Musculoskeletal: Positive for back pain and joint pain.  Psychiatric/Behavioral: The patient is nervous/anxious.     Psychiatric: Agitation: No Hallucination: No Depressed Mood: No Insomnia: No Hypersomnia: No Altered Concentration: No Feels Worthless: No Grandiose Ideas: No Belief In Special Powers: No New/Increased Substance Abuse: No Compulsions: No  Neurologic: Headache: Yes Seizure: No Paresthesias:  No  Past Medical Family, Social History: Patient has history of arthritis, hyperlipidemia, hemorrhoid, chronic constipation, GERD, recurrent UTI, Parkinson disease, chronic arrhythmia.  She see physician at [facility].  Patient has 2 daughter.  Her parents are deceased.  Outpatient Encounter Prescriptions as of 12/01/2012  Medication Sig Dispense Refill  . albuterol (VENTOLIN HFA) 108 (90 BASE) MCG/ACT inhaler Inhale 2 puffs into the lungs 4 (four) times daily as needed. For shortness of breath      . aspirin 81 MG tablet Take 81 mg by mouth daily.       . Calcium Carbonate-Vitamin D (CALCIUM 600+D) 600-400 MG-UNIT per tablet Take 1 tablet by mouth 2 (two) times daily.        . celecoxib (CELEBREX) 200 MG capsule Take 200 mg by mouth daily.      . Cranberry-Vitamin C-Vitamin E (CRANBERRY SUPER STRENGTH) 6000-100-3 MG-MG-UNIT CAPS Take 1 capsule by mouth every morning.        . digoxin (LANOXIN) 0.125 MG tablet Take 125 mcg by mouth daily.        . fish oil-omega-3 fatty acids 1000 MG capsule Take 1 g by mouth every morning.       . Hypromellose (ARTIFICIAL TEARS OP) Apply 1 drop to eye 2 (two) times daily as needed (dry eye).      . Multiple Vitamin (MULTIVITAMIN WITH MINERALS) TABS Take 1 tablet by mouth daily.      . polyethylene glycol (MIRALAX / GLYCOLAX) packet Take 17 g by mouth daily as needed (constipation).      . propafenone (RYTHMOL) 150 MG tablet Take 150 mg by  mouth 3 (three) times daily.      . sertraline (ZOLOFT) 100 MG tablet Take 1 tablet (100 mg total) by mouth daily.  90 tablet  0  . warfarin (COUMADIN) 2.5 MG tablet Take 5 mg by mouth at bedtime.       . [DISCONTINUED] sertraline (ZOLOFT) 100 MG tablet Take 1 tablet (100 mg total) by mouth daily.  90 tablet  0   No facility-administered encounter medications on file as of 12/01/2012.    Past Psychiatric History/Hospitalization(s): Anxiety: Yes Bipolar Disorder: No Depression: No Mania: No Psychosis: No Schizophrenia:  No Personality Disorder: No Hospitalization for psychiatric illness: No History of Electroconvulsive Shock Therapy: No Prior Suicide Attempts: No  Physical Exam: Constitutional:  BP 90/60  Pulse 68  General Appearance: alert, oriented, no acute distress and Casually dressed .  She is tall and thin woman.    Musculoskeletal: Strength & Muscle Tone: decreased Gait & Station: shuffle Patient leans: Front  Mental status examination Patient is a thin lean person who is well groomed casually dressed.  She is anxious but cooperative.  Her speech is fast with rapid volume and tone.  Her thought process is circumstantial.  There were no tremors or shakes present per her psychomotor activity is slightly increased.  She maintained fair eye contact.  Her attention and concentration is distracted at times.  She described her mood is anxious and her affect is mood appropriate.  She denies any auditory hallucination but admitted visual hallucination and seeing shadows.  She denies any paranoia or any delusion at this time.  There were no flight of ideas.  She's alert and oriented x3.  However she do not recall very well remote memory.  Her insight judgment and impulse control is okay.  Medical Decision Making (Choose Three): Established Problem, Stable/Improving (1), Review of Last Therapy Session (1) and Review of Medication Regimen & Side Effects (2)  Assessment: Axis I: Anxiety disorder  Axis II: Deferred  Axis III: History of arthritis, hyperlipidemia, hemorrhoid, chronic constipation, GERD, recurrent UTI, Parkinson disease, chronic arrhythmia.  Recent back and shoulder pain.  Axis IV: Mild to moderate  Axis V: 60-65   Plan:  I will continue Zoloft 100 mg daily.  Recommend to call us back if she has any questions or concerns.  She is seeing Baxter Hire and those office for counseling.  I will see her again in 3 months.  Alicha Raspberry T., MD 12/01/2012

## 2012-12-03 ENCOUNTER — Encounter: Payer: Self-pay | Admitting: Family Medicine

## 2012-12-06 NOTE — Telephone Encounter (Signed)
Pt labs mailed to pt. Please advise if pt labs were good why she may be feeling so fatigued.

## 2012-12-07 DIAGNOSIS — Z7901 Long term (current) use of anticoagulants: Secondary | ICD-10-CM | POA: Diagnosis not present

## 2012-12-08 ENCOUNTER — Telehealth: Payer: Self-pay

## 2012-12-08 NOTE — Telephone Encounter (Signed)
Message left on voicemail: Patient has a pending appointment with Cardiology tomorrow and would like labs faxed.  Spoke with patient, patient informed message received and labs faxed to [phone]

## 2012-12-09 DIAGNOSIS — I4891 Unspecified atrial fibrillation: Secondary | ICD-10-CM | POA: Diagnosis not present

## 2012-12-09 DIAGNOSIS — R5381 Other malaise: Secondary | ICD-10-CM | POA: Diagnosis not present

## 2012-12-09 DIAGNOSIS — R0602 Shortness of breath: Secondary | ICD-10-CM | POA: Diagnosis not present

## 2012-12-09 NOTE — Assessment & Plan Note (Signed)
Chronic problem.  Remains in denial about her dx but is following w/ neuro.  Continues to have tremor but otherwise is doing very well.  Will follow along.

## 2012-12-09 NOTE — Assessment & Plan Note (Signed)
Remains irregularly irregular today.  Following w/ Dr Jacinto Halim.  On Coumadin.

## 2012-12-09 NOTE — Assessment & Plan Note (Signed)
Check Vit D level 

## 2012-12-09 NOTE — Assessment & Plan Note (Signed)
Pt's PE WNL w/ exception of known abnormalities- Afib, parkinsonian tremor.  UTD on health maintenance.  Check labs.  Anticipatory guidance provided.

## 2012-12-09 NOTE — Assessment & Plan Note (Signed)
Chronic problem.  Following w/ behavioral health.  Seems better controlled than at previous visits.

## 2012-12-14 DIAGNOSIS — R0602 Shortness of breath: Secondary | ICD-10-CM | POA: Diagnosis not present

## 2012-12-15 ENCOUNTER — Ambulatory Visit (INDEPENDENT_AMBULATORY_CARE_PROVIDER_SITE_OTHER): Payer: Medicare Other | Admitting: Licensed Clinical Social Worker

## 2012-12-15 DIAGNOSIS — F411 Generalized anxiety disorder: Secondary | ICD-10-CM | POA: Diagnosis not present

## 2012-12-15 NOTE — Progress Notes (Signed)
   THERAPIST PROGRESS NOTE  Session Time: 11:30am-12:20pm  Participation Level: Active  Behavioral Response: Well GroomedAlertAnxious  Type of Therapy: Individual Therapy  Treatment Goals addressed: Anxiety and Coping  Interventions: CBT, Strength-based, Supportive and Reframing  Summary: [name] is a 79 y.o. female who presents with euthymic mood and anxious affect. She is highly anxious about the dental work she is having done and is upset that her dentist is not listening to her when she questions what he has recommended and tells him she can't afford it. She feels taken advantage of. She has been unable to definitively decide if she will follow through on the dental work, but she reports that she is leaning towards not doing it. She wants to say no, but reports getting side tracked by the dentist's "sweet talking".  She agrees to call her daughter and have her contact the dentist on behalf of patient. Patient demonstrates difficulty making clear decisions as she has high levels of self dbout.    Suicidal/Homicidal: Nowithout intent/plan  Therapist Response: Assessed patients current functioning and reviewed progress. Reviewed coping strategies. Assessed patients safety and assisted in identifying protective factors.  Reviewed crisis plan with patient. Assisted patient with the expression of anxiety. Reviewed patients self care plan. Assessed progress related to self care. Patients self care is good. Recommend daily exercise, increased socialization and recreation. Used CBT to assist patient with the identification of negative distortions and irrational thoughts. Encouraged patient to verbalize alternative and factual responses which challenge thought distortions. Reviewed healthy boundaries and assertive communication.   Plan: Return again in two weeks.  Diagnosis: Axis I: Generalized Anxiety Disorder    Axis II: No diagnosis    Jozeph Persing, LCSW 12/15/2012

## 2012-12-16 ENCOUNTER — Encounter: Payer: Self-pay | Admitting: Nurse Practitioner

## 2012-12-16 ENCOUNTER — Ambulatory Visit: Payer: Self-pay | Admitting: Family Medicine

## 2012-12-16 ENCOUNTER — Ambulatory Visit (INDEPENDENT_AMBULATORY_CARE_PROVIDER_SITE_OTHER): Payer: Medicare Other | Admitting: Nurse Practitioner

## 2012-12-16 VITALS — BP 102/60 | HR 66 | Temp 98.3°F | Ht 70.75 in | Wt 154.6 lb

## 2012-12-16 DIAGNOSIS — T148XXA Other injury of unspecified body region, initial encounter: Secondary | ICD-10-CM

## 2012-12-16 DIAGNOSIS — IMO0002 Reserved for concepts with insufficient information to code with codable children: Secondary | ICD-10-CM | POA: Diagnosis not present

## 2012-12-16 MED ORDER — MUPIROCIN 2 % EX OINT: TOPICAL_OINTMENT | Freq: Three times a day (TID) | CUTANEOUS | Status: DC

## 2012-12-16 NOTE — Patient Instructions (Signed)
Apply prescription cream 3 times daily for 7 days. Keep covered with band-aid. Wash wound with mild soap & water twice daily. If there is no improvement, see dermatologist. Keep skin moisturized to prevent skin break down. Pleasure to meet you.

## 2012-12-16 NOTE — Progress Notes (Signed)
  Subjective:    [name] is a 79 y.o. female who presents for evaluation of an abrasion to RLE.  Injury occurred 1 week ago. The mechanism of the wound was scratched leg in sleep. The patient reports no pain in the affected area. She has been dressing wound with bacitracin and band aid. She is here because she does not feel it is healing. The following portions of the patient's history were reviewed and updated as appropriate: allergies, current medications, past medical history, past social history and problem list.  Review of Systems Constitutional: negative for anorexia, chills, fatigue, fevers, night sweats and weight loss Integument/breast: positive for skin lesion(s)    Objective:    BP 102/60  Pulse 66  Temp(Src) 98.3 F (36.8 C) (Oral)  Ht 5' 10.75" (1.797 m)  Wt 154 lb 9.6 oz (70.126 kg)  BMI 21.72 kg/m2  SpO2 94%  There is a circular superficial open wound approx. 1 cm in diam at outer aspect of R LE. No redness surrounding wound. Wound is pink & moist. No drainage or bleeding.  Examination of the wound for foreign bodies and devitalized tissue showed none.  Skin is very dry in LW, with tortuous varicose veins in L LE. No edema, +1 pedal pulses.  Wound care: applied bacitracin and band aid. Assessment:    1 cm lower leg laceration    Plan:    Wound care discussed. Muprirocin. See pt instructions.  1 box celebrex samples given, pt takes for osteoarthritis.

## 2012-12-21 ENCOUNTER — Ambulatory Visit
Admission: RE | Admit: 2012-12-21 | Discharge: 2012-12-21 | Disposition: A | Discharge status: Home / Self Care | Payer: Medicare Other | Source: Ambulatory Visit

## 2012-12-21 DIAGNOSIS — Z1231 Encounter for screening mammogram for malignant neoplasm of breast: Secondary | ICD-10-CM | POA: Diagnosis not present

## 2012-12-29 ENCOUNTER — Ambulatory Visit (INDEPENDENT_AMBULATORY_CARE_PROVIDER_SITE_OTHER): Payer: Medicare Other | Admitting: Licensed Clinical Social Worker

## 2012-12-29 DIAGNOSIS — F411 Generalized anxiety disorder: Secondary | ICD-10-CM

## 2012-12-29 NOTE — Progress Notes (Signed)
   THERAPIST PROGRESS NOTE  Session Time: 8:30am-9:20am  Participation Level: Active  Behavioral Response: Well GroomedAlertAnxious  Type of Therapy: Individual Therapy  Treatment Goals addressed: Coping  Interventions: CBT, Strength-based, Supportive and Reframing  Summary: [name] is a 79 y.o. female who presents with euthymic mood and bright affect. She reports that she is doing well and feeling well. She is excited about many things she is involved in and has started bible study, which she finds grounds her and decreases her anxiety with improved perspective. She processes her frustration with her daughter, [name] who has asked her for money again. She was unable to say no and lent her $400 dollars. She processes her anger over this and explores setting firmer boundaries. Her sleep and appetite are wnl.    Suicidal/Homicidal: Nowithout intent/plan  Therapist Response: Assessed patients current functioning and reviewed progress. Reviewed coping strategies. Assessed patients safety and assisted in identifying protective factors.  Reviewed crisis plan with patient. Assisted patient with the expression of anxiety. Reviewed patients self care plan. Assessed progress related to self care. Patients self care is good. Recommend daily exercise, increased socialization and recreation. Used CBT to assist patient with the identification of negative distortions and irrational thoughts. Encouraged patient to verbalize alternative and factual responses which challenge thought distortions. Reviewed healthy boundaries and assertive communication.   Plan: Return again in two weeks.  Diagnosis: Axis I: Generalized Anxiety Disorder    Axis II: No diagnosis    Brodrick Curran, LCSW 12/29/2012

## 2013-01-03 ENCOUNTER — Encounter: Payer: Self-pay | Admitting: Podiatrist

## 2013-01-04 DIAGNOSIS — Z7901 Long term (current) use of anticoagulants: Secondary | ICD-10-CM | POA: Diagnosis not present

## 2013-01-06 ENCOUNTER — Encounter (HOSPITAL_COMMUNITY): Payer: Self-pay | Admitting: Emergency Medicine

## 2013-01-06 ENCOUNTER — Emergency Department (HOSPITAL_COMMUNITY): Payer: Medicare Other

## 2013-01-06 ENCOUNTER — Emergency Department (HOSPITAL_COMMUNITY)
Admission: EM | Admit: 2013-01-06 | Discharge: 2013-01-06 | Disposition: A | Discharge status: Home / Self Care | Payer: Medicare Other | Attending: Emergency Medicine | Admitting: Emergency Medicine

## 2013-01-06 DIAGNOSIS — M129 Arthropathy, unspecified: Secondary | ICD-10-CM | POA: Insufficient documentation

## 2013-01-06 DIAGNOSIS — R0602 Shortness of breath: Secondary | ICD-10-CM | POA: Diagnosis not present

## 2013-01-06 DIAGNOSIS — Z79899 Other long term (current) drug therapy: Secondary | ICD-10-CM | POA: Diagnosis not present

## 2013-01-06 DIAGNOSIS — Z87440 Personal history of urinary (tract) infections: Secondary | ICD-10-CM | POA: Insufficient documentation

## 2013-01-06 DIAGNOSIS — Z862 Personal history of diseases of the blood and blood-forming organs and certain disorders involving the immune mechanism: Secondary | ICD-10-CM | POA: Insufficient documentation

## 2013-01-06 DIAGNOSIS — F329 Major depressive disorder, single episode, unspecified: Secondary | ICD-10-CM | POA: Insufficient documentation

## 2013-01-06 DIAGNOSIS — F3289 Other specified depressive episodes: Secondary | ICD-10-CM | POA: Insufficient documentation

## 2013-01-06 DIAGNOSIS — Z7901 Long term (current) use of anticoagulants: Secondary | ICD-10-CM | POA: Diagnosis not present

## 2013-01-06 DIAGNOSIS — Z8619 Personal history of other infectious and parasitic diseases: Secondary | ICD-10-CM | POA: Diagnosis not present

## 2013-01-06 DIAGNOSIS — R5381 Other malaise: Secondary | ICD-10-CM | POA: Diagnosis not present

## 2013-01-06 DIAGNOSIS — Z860100 Personal history of colon polyps, unspecified: Secondary | ICD-10-CM | POA: Insufficient documentation

## 2013-01-06 DIAGNOSIS — G20 Parkinson's disease: Secondary | ICD-10-CM | POA: Insufficient documentation

## 2013-01-06 DIAGNOSIS — Z87798 Personal history of other (corrected) congenital malformations: Secondary | ICD-10-CM | POA: Insufficient documentation

## 2013-01-06 DIAGNOSIS — R55 Syncope and collapse: Secondary | ICD-10-CM | POA: Diagnosis not present

## 2013-01-06 DIAGNOSIS — I4891 Unspecified atrial fibrillation: Secondary | ICD-10-CM

## 2013-01-06 DIAGNOSIS — K5900 Constipation, unspecified: Secondary | ICD-10-CM | POA: Diagnosis not present

## 2013-01-06 DIAGNOSIS — F411 Generalized anxiety disorder: Secondary | ICD-10-CM | POA: Insufficient documentation

## 2013-01-06 DIAGNOSIS — R51 Headache: Secondary | ICD-10-CM | POA: Diagnosis not present

## 2013-01-06 DIAGNOSIS — J438 Other emphysema: Secondary | ICD-10-CM | POA: Diagnosis not present

## 2013-01-06 DIAGNOSIS — N390 Urinary tract infection, site not specified: Secondary | ICD-10-CM

## 2013-01-06 DIAGNOSIS — Z8639 Personal history of other endocrine, nutritional and metabolic disease: Secondary | ICD-10-CM | POA: Insufficient documentation

## 2013-01-06 DIAGNOSIS — G20A1 Parkinson's disease without dyskinesia, without mention of fluctuations: Secondary | ICD-10-CM | POA: Insufficient documentation

## 2013-01-06 DIAGNOSIS — Z792 Long term (current) use of antibiotics: Secondary | ICD-10-CM | POA: Insufficient documentation

## 2013-01-06 DIAGNOSIS — Z87891 Personal history of nicotine dependence: Secondary | ICD-10-CM | POA: Diagnosis not present

## 2013-01-06 LAB — CBC WITH DIFFERENTIAL/PLATELET
Basophils Absolute: 0 10*3/uL (ref 0.0–0.1)
Basophils Relative: 0 % (ref 0–1)
Eosinophils Absolute: 0.1 10*3/uL (ref 0.0–0.7)
Eosinophils Relative: 1 % (ref 0–5)
HCT: 40.1 % (ref 36.0–46.0)
Hemoglobin: 13.1 g/dL (ref 12.0–15.0)
Lymphocytes Relative: 18 % (ref 12–46)
Lymphs Abs: 1.5 10*3/uL (ref 0.7–4.0)
MCH: 30.3 pg (ref 26.0–34.0)
MCHC: 32.7 g/dL (ref 30.0–36.0)
MCV: 92.8 fL (ref 78.0–100.0)
Monocytes Absolute: 0.5 K/uL (ref 0.1–1.0)
Monocytes Relative: 6 % (ref 3–12)
Neutro Abs: 6.4 K/uL (ref 1.7–7.7)
Neutrophils Relative %: 75 % (ref 43–77)
Platelets: 228 10*3/uL (ref 150–400)
RBC: 4.32 MIL/uL (ref 3.87–5.11)
RDW: 13.9 % (ref 11.5–15.5)
WBC: 8.5 10*3/uL (ref 4.0–10.5)

## 2013-01-06 LAB — BASIC METABOLIC PANEL
GFR calc non Af Amer: 79 mL/min — ABNORMAL LOW (ref >90)
Glucose, Bld: 85 mg/dL (ref 70–99)
Potassium: 4 mEq/L (ref 3.5–5.1)
Sodium: 142 mEq/L (ref 135–145)

## 2013-01-06 LAB — PROTIME-INR
INR: 2.15 — ABNORMAL HIGH (ref 0.00–1.49)
Prothrombin Time: 23.3 seconds — ABNORMAL HIGH (ref 11.6–15.2)

## 2013-01-06 LAB — URINALYSIS, ROUTINE W REFLEX MICROSCOPIC
Bilirubin Urine: NEGATIVE
Glucose, UA: NEGATIVE mg/dL
Hgb urine dipstick: NEGATIVE
Ketones, ur: NEGATIVE mg/dL
Nitrite: POSITIVE — AB
Protein, ur: NEGATIVE mg/dL
Specific Gravity, Urine: 1.008 (ref 1.005–1.030)
Urobilinogen, UA: 0.2 mg/dL (ref 0.0–1.0)
pH: 7 (ref 5.0–8.0)

## 2013-01-06 LAB — URINE MICROSCOPIC-ADD ON

## 2013-01-06 LAB — BASIC METABOLIC PANEL WITH GFR
BUN: 12 mg/dL (ref 6–23)
CO2: 33 meq/L — ABNORMAL HIGH (ref 19–32)
Calcium: 9.2 mg/dL (ref 8.4–10.5)
Chloride: 102 meq/L (ref 96–112)
Creatinine, Ser: 0.69 mg/dL (ref 0.50–1.10)
GFR calc Af Amer: >90 mL/min (ref >90)

## 2013-01-06 MED ORDER — NITROFURANTOIN MONOHYD MACRO 100 MG PO CAPS: 100.0000 mg | ORAL_CAPSULE | Freq: Two times a day (BID) | ORAL | Status: DC

## 2013-01-06 MED ORDER — DIGOXIN 125 MCG PO TABS
0.1250 mg | ORAL_TABLET | Freq: Once | ORAL | Status: AC
  Administered 2013-01-06: 0.125 mg via ORAL
  Filled 2013-01-06: qty 1

## 2013-01-06 MED ORDER — SULFAMETHOXAZOLE-TMP DS 800-160 MG PO TABS
1.0000 | ORAL_TABLET | Freq: Once | ORAL | Status: AC
  Administered 2013-01-06: 1 via ORAL
  Filled 2013-01-06: qty 1

## 2013-01-06 NOTE — Discharge Instructions (Signed)
 Atrial Fibrillation Your caregiver has diagnosed you with atrial fibrillation (AFib). The heart normally beats very regularly; AFib is a type of irregular heartbeat. The heart rate may be faster or slower than normal. This can prevent your heart from pumping as well as it should. AFib can be constant (chronic) or intermittent (paroxysmal). CAUSES  Atrial fibrillation may be caused by:  Heart disease, including heart attack, coronary artery disease, heart failure, diseases of the heart valves, and others.  Blood clot in the lungs (pulmonary embolism).  Pneumonia or other infections.  Chronic lung disease.  Thyroid  disease.  Toxins. These include alcohol , some medications (such as decongestant medications or diet pills), and caffeine. In some people, no cause for AFib can be found. This is referred to as Lone Atrial Fibrillation. SYMPTOMS   Palpitations or a fluttering in your chest.  A vague sense of chest discomfort.  Shortness of breath.  Sudden onset of lightheadedness or weakness. Sometimes, the first sign of AFib can be a complication of the condition. This could be a stroke or heart failure. DIAGNOSIS  Your description of your condition may make your caregiver suspicious of atrial fibrillation. Your caregiver will examine your pulse to determine if fibrillation is present. An EKG (electrocardiogram) will confirm the diagnosis. Further testing may help determine what caused you to have atrial fibrillation. This may include chest x-ray, echocardiogram, blood tests, or CT scans. PREVENTION  If you have previously had atrial fibrillation, your caregiver may advise you to avoid substances known to cause the condition (such as stimulant medications, and possibly caffeine or alcohol ). You may be advised to use medications to prevent recurrence. Proper treatment of any underlying condition is important to help prevent recurrence. PROGNOSIS  Atrial fibrillation does tend to become a  chronic condition over time. It can cause significant complications (see below). Atrial fibrillation is not usually immediately life-threatening, but it can shorten your life expectancy. This seems to be worse in women. If you have lone atrial fibrillation and are under 18 years old, the risk of complications is very low, and life expectancy is not shortened. RISKS AND COMPLICATIONS  Complications of atrial fibrillation can include stroke, chest pain, and heart failure. Your caregiver will recommend treatments for the atrial fibrillation, as well as for any underlying conditions, to help minimize risk of complications. TREATMENT  Treatment for AFib is divided into several categories:  Treatment of any underlying condition.  Converting you out of AFib into a regular (sinus) rhythm.  Controlling rapid heart rate.  Prevention of blood clots and stroke. Medications and procedures are available to convert your atrial fibrillation to sinus rhythm. However, recent studies have shown that this may not offer you any advantage, and cardiac experts are continuing research and debate on this topic. More important is controlling your rapid heartbeat. The rapid heartbeat causes more symptoms, and places strain on your heart. Your caregiver will advise you on the use of medications that can control your heart rate. Atrial fibrillation is a strong stroke risk. You can lessen this risk by taking blood thinning medications such as Coumadin (warfarin), or sometimes aspirin . These medications need close monitoring by your caregiver. Over-medication can cause bleeding. Too little medication may not protect against stroke. HOME CARE INSTRUCTIONS   If your caregiver prescribed medicine to make your heartbeat more normally, take as directed.  If blood thinners were prescribed by your caregiver, take EXACTLY as directed.  Perform blood tests EXACTLY as directed.  Quit smoking. Smoking increases your cardiac and  lung  (pulmonary) risks.  DO NOT drink alcohol .  DO NOT drink caffeinated drinks (e.g. coffee, soda, chocolate, and leaf teas). You may drink decaffeinated coffee, soda or tea.  If you are overweight, you should choose a reduced calorie diet to lose weight. Please see a registered dietitian if you need more information about healthy weight loss. DO NOT USE DIET PILLS as they may aggravate heart problems.  If you have other heart problems that are causing AFib, you may need to eat a low salt, fat, and cholesterol diet. Your caregiver will tell you if this is necessary.  Exercise every day to improve your physical fitness. Stay active unless advised otherwise.  If your caregiver has given you a follow-up appointment, it is very important to keep that appointment. Not keeping the appointment could result in heart failure or stroke. If there is any problem keeping the appointment, you must call back to this facility for assistance. SEEK MEDICAL CARE IF:  You notice a change in the rate, rhythm or strength of your heartbeat.  You develop an infection or any other change in your overall health status. SEEK IMMEDIATE MEDICAL CARE IF:   You develop chest pain, abdominal pain, sweating, weakness or feel sick to your stomach (nausea).  You develop shortness of breath.  You develop swollen feet and ankles.  You develop dizziness, numbness, or weakness of your face or limbs, or any change in vision or speech. MAKE SURE YOU:   Understand these instructions.  Will watch your condition.  Will get help right away if you are not doing well or get worse. Document Released: 04/07/2005 Document Revised: 06/30/2011 Document Reviewed: 11/14/2009 [facility] Patient Information 2014 [place], [place].     Urinary Tract Infection Urinary tract infections (UTIs) can develop anywhere along your urinary tract. Your urinary tract is your body's drainage system for removing wastes and extra water. Your urinary  tract includes two kidneys, two ureters, a bladder, and a urethra. Your kidneys are a pair of bean-shaped organs. Each kidney is about the size of your fist. They are located below your ribs, one on each side of your spine. CAUSES Infections are caused by microbes, which are microscopic organisms, including fungi, viruses, and bacteria. These organisms are so small that they can only be seen through a microscope. Bacteria are the microbes that most commonly cause UTIs. SYMPTOMS  Symptoms of UTIs may vary by age and gender of the patient and by the location of the infection. Symptoms in young women typically include a frequent and intense urge to urinate and a painful, burning feeling in the bladder or urethra during urination. Older women and men are more likely to be tired, shaky, and weak and have muscle aches and abdominal pain. A fever may mean the infection is in your kidneys. Other symptoms of a kidney infection include pain in your back or sides below the ribs, nausea, and vomiting. DIAGNOSIS To diagnose a UTI, your caregiver will ask you about your symptoms. Your caregiver also will ask to provide a urine sample. The urine sample will be tested for bacteria and white blood cells. White blood cells are made by your body to help fight infection. TREATMENT  Typically, UTIs can be treated with medication. Because most UTIs are caused by a bacterial infection, they usually can be treated with the use of antibiotics. The choice of antibiotic and length of treatment depend on your symptoms and the type of bacteria causing your infection. HOME CARE INSTRUCTIONS  If  you were prescribed antibiotics, take them exactly as your caregiver instructs you. Finish the medication even if you feel better after you have only taken some of the medication.  Drink enough water and fluids to keep your urine clear or pale yellow.  Avoid caffeine, tea, and carbonated beverages. They tend to irritate your  bladder.  Empty your bladder often. Avoid holding urine for long periods of time.  Empty your bladder before and after sexual intercourse.  After a bowel movement, women should cleanse from front to back. Use each tissue only once. SEEK MEDICAL CARE IF:   You have back pain.  You develop a fever.  Your symptoms do not begin to resolve within 3 days. SEEK IMMEDIATE MEDICAL CARE IF:   You have severe back pain or lower abdominal pain.  You develop chills.  You have nausea or vomiting.  You have continued burning or discomfort with urination. MAKE SURE YOU:   Understand these instructions.  Will watch your condition.  Will get help right away if you are not doing well or get worse. Document Released: 01/15/2005 Document Revised: 10/07/2011 Document Reviewed: 05/16/2011 [facility] Patient Information 2014 [place], [place].

## 2013-01-06 NOTE — ED Notes (Signed)
Upon return to room pt states she is unhappy with the outcome of visit and is unsatisfied with the test results, pt states she still doesn't feel like she has answers for the way she was feeling. MD Ghim notified and asked to speak with patient again prior to discharge.

## 2013-01-06 NOTE — ED Provider Notes (Signed)
CSN: [id number]     Arrival date & time 01/06/13  4098 History   First MD Initiated Contact with Patient 01/06/13 [phone]     Chief Complaint  Patient presents with  . Shortness of Breath  . Atrial Fibrillation   (Consider location/radiation/quality/duration/timing/severity/associated sxs/prior Treatment) Patient is a 79 y.o. female presenting with shortness of breath and atrial fibrillation. The history is provided by the patient.  Shortness of Breath Severity:  Severe Onset quality:  Sudden Duration:  2 hours Timing:  Constant Progression:  Partially resolved Chronicity:  New Relieved by:  Nothing Worsened by:  Nothing tried Ineffective treatments:  None tried Associated symptoms: headaches   Associated symptoms: no abdominal pain, no cough, no diaphoresis, no fever, no vomiting and no wheezing   Headaches:    Severity:  Moderate   Onset quality:  Gradual   Duration:  2 hours   Timing:  Constant   Progression:  Partially resolved   Chronicity:  New Atrial Fibrillation Associated symptoms include headaches and shortness of breath. Pertinent negatives include no abdominal pain.    Past Medical History  Diagnosis Date  . Arthritis   . Hyperlipidemia   . 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 Chronic constipation   . Hemorrhoid   . Varicose veins   . Anxiety   . Depression   . GERD (gastroesophageal reflux disease)   . Atrial fibrillation   . Recurrent UTI   . Parkinson disease   . Cardiac arrhythmia due to congenital heart disease   . Mycotic toenails 10/27/2012   Past Surgical History  Procedure Laterality Date  . Appendectomy    . Ileal resection  03/2007    Due to obstruction   Family History  Problem Relation Age of Onset  . Asthma Brother   . Alcohol abuse Brother   . Throat cancer Father   . Alcohol abuse Father   . Lupus Daughter   . Bipolar disorder Daughter   . Lupus Daughter   . Anxiety disorder Daughter   . Alcohol abuse Sister   . Alcohol  abuse Maternal Grandfather   . Alcohol abuse Paternal Grandmother    History  Substance Use Topics  . Smoking status: Former Smoker -- 2.00 packs/day for 30 years    Types: Cigarettes    Quit date: 04/21/1980  . Smokeless tobacco: Never Used     Comment: onset age 57 -21, up to > 1ppd (almost 2 ppd)  . Alcohol Use: 0.0 oz/week   OB History   Grav Para Term Preterm Abortions TAB SAB Ect Mult Living                 Review of Systems  Constitutional: Negative for fever and diaphoresis.  Respiratory: Positive for shortness of breath. Negative for cough and wheezing.   Gastrointestinal: Negative for vomiting and abdominal pain.  Neurological: Positive for headaches.  All other systems reviewed and are negative.    Allergies  Adhesive; Amoxicillin; Ciprofloxacin; and Ultram  Home Medications   Current Outpatient Rx  Name  Route  Sig  Dispense  Refill  . albuterol (VENTOLIN HFA) 108 (90 BASE) MCG/ACT inhaler   Inhalation   Inhale 2 puffs into the lungs 4 (four) times daily as needed. For shortness of breath         . Calcium Carbonate-Vitamin D (CALCIUM 600+D) 600-400 MG-UNIT per tablet   Oral   Take 1 tablet by mouth 2 (two) times daily.           Marland Kitchen  celecoxib (CELEBREX) 200 MG capsule   Oral   Take 200 mg by mouth daily.         . Cranberry-Vitamin C-Vitamin E (CRANBERRY SUPER STRENGTH) 6000-100-3 MG-MG-UNIT CAPS   Oral   Take 1 capsule by mouth every morning.           . digoxin (LANOXIN) 0.125 MG tablet   Oral   Take 125 mcg by mouth daily.           . fish oil-omega-3 fatty acids 1000 MG capsule   Oral   Take 1 g by mouth every morning.          . Hypromellose (ARTIFICIAL TEARS OP)   Ophthalmic   Apply 1 drop to eye 2 (two) times daily as needed (dry eye).         . mupirocin ointment (BACTROBAN) 2 %   Topical   Apply topically 3 (three) times daily.   22 g   0   . sertraline (ZOLOFT) 100 MG tablet   Oral   Take 1 tablet (100 mg total)  by mouth daily.   90 tablet   0   . warfarin (COUMADIN) 2.5 MG tablet   Oral   Take 2.5-5 mg by mouth at bedtime. Mon Wed and Sat take 5mg , take 2.5mg  all other days         . nitrofurantoin, macrocrystal-monohydrate, (MACROBID) 100 MG capsule   Oral   Take 1 capsule (100 mg total) by mouth 2 (two) times daily.   14 capsule   0    BP 105/62  Pulse 86  Temp(Src) 97.4 F (36.3 C) (Oral)  Resp 27  Ht 5\' 10"  (1.778 m)  Wt 154 lb (69.854 kg)  BMI 22.1 kg/m2  SpO2 97% Physical Exam  Nursing note and vitals reviewed. Constitutional: She is oriented to person, place, and time. She appears well-developed and well-nourished.  HENT:  Head: Normocephalic and atraumatic.  Eyes: EOM are normal.  Neck: No JVD present. No tracheal deviation present.  Cardiovascular: Normal rate and normal pulses.  An irregularly irregular rhythm present.  No extrasystoles are present.  No murmur heard. Pulmonary/Chest: Effort normal. No stridor. No respiratory distress. She has no wheezes.  Abdominal: Soft. She exhibits no distension.  Neurological: She is alert and oriented to person, place, and time. She has normal strength. No sensory deficit.  Skin: Skin is warm.  Psychiatric: She has a normal mood and affect. Her speech is normal. Judgment and thought content normal. Cognition and memory are normal.    ED Course  Procedures (including critical care time) Labs Review Labs Reviewed  BASIC METABOLIC PANEL - Abnormal; Notable for the following:    CO2 33 (*)    GFR calc non Af Amer 79 (*)    All other components within normal limits  PROTIME-INR - Abnormal; Notable for the following:    Prothrombin Time 23.3 (*)    INR 2.15 (*)    All other components within normal limits  URINALYSIS, ROUTINE W REFLEX MICROSCOPIC - Abnormal; Notable for the following:    APPearance CLOUDY (*)    Nitrite POSITIVE (*)    Leukocytes, UA LARGE (*)    All other components within normal limits  URINE  MICROSCOPIC-ADD ON - Abnormal; Notable for the following:    Bacteria, UA MANY (*)    All other components within normal limits  URINE CULTURE  CBC WITH DIFFERENTIAL  TROPONIN I   Imaging Review Dg Chest 2 View  01/06/2013   *RADIOLOGY REPORT*  Clinical Data: Weakness.  Shortness of breath.  CHEST - 2 VIEW  Comparison: 07/25/2012  Findings: The lungs are hyperinflated with chronic accentuation of the interstitial markings consistent with emphysema.  No acute infiltrates or effusions.  The heart size and vascularity are normal.  Chronic thoracolumbar scoliosis.  IMPRESSION: No acute abnormality.  Emphysema.   Original Report Authenticated By: Francene Boyers, M.D.    RA sat is 99% and I interpret to be adequate  ECG at time 0932 shows atrial fibrillation at rate 78, normal axis, non specific ST abn, likely related to digoxin effect.  Abn ECG.  Atrial fibrillation is new compared to ECG on 07/25/12.  3:11 PM Pt's labs are unremarkable.  CXR shows no acute abn.  Troponin is negative.  UA is positive for UTI which may have made her feel more weak.  Pt is in atrial fib, but rate controlled, INR is near therapeutic.  Pt given oral abx and ambulated in the ED with no weakness, syncopal symptoms.  Pt is reassured, I have encouraged to follow up with PCP  MDM   1. Atrial fibrillation   2. Urinary tract infection     Pt with h/o atrial fib, per EMS report, sounds as though pt was in RVR prior to arrival which would explain most of patient symptoms of weakness, difficulty standing and walking.  No fever, has had slight cough only., no N/V/D. Has been taking her usual meds at home.  Will check labs, CXR, give digoxin here and continue to monitor.      Gavin Pound. Oletta Lamas, MD 01/06/13 [phone]

## 2013-01-06 NOTE — ED Notes (Signed)
Per EMS, patient started feeling short of breath and weak when she was doing ADL's at home this morning.   Patient has chronic AFIB, but rate was uncontrolled when EMS arrived.   It leveled off on route to hospital and stayed in the 80s per EMS.  Patient states she has a slight headache.   Patient claims that she normally has no problems.

## 2013-01-06 NOTE — ED Notes (Signed)
Patient states got very light headed and SOB when going from lying to sitting during orthosatics.   Patient couldn't stand due to being symptomatic.

## 2013-01-08 LAB — URINE CULTURE: Colony Count: >=100000

## 2013-01-09 ENCOUNTER — Telehealth (HOSPITAL_COMMUNITY): Payer: Self-pay | Admitting: Emergency Medicine

## 2013-01-09 NOTE — ED Notes (Signed)
Post ED Visit - Positive Culture Follow-up  Culture report reviewed by antimicrobial stewardship pharmacist: [x]  Wes Dulaney, Pharm.D., BCPS []  Celedonio Miyamoto, Pharm.D., BCPS []  Georgina Pillion, Pharm.D., BCPS []  [place], [address].D., BCPS, AAHIVP []  Estella Husk, Pharm.D., BCPS, AAHIVP  Positive urine culture Treated with Macrobid, organism sensitive to the same and no further patient follow-up is required at this time.  Kylie A Holland 01/09/2013, 6:09 PM

## 2013-01-12 ENCOUNTER — Ambulatory Visit (INDEPENDENT_AMBULATORY_CARE_PROVIDER_SITE_OTHER): Payer: Medicare Other | Admitting: Family Medicine

## 2013-01-12 ENCOUNTER — Encounter: Payer: Self-pay | Admitting: Family Medicine

## 2013-01-12 VITALS — BP 108/74 | HR 60 | Temp 98.3°F | Ht 70.75 in | Wt 157.4 lb

## 2013-01-12 DIAGNOSIS — F411 Generalized anxiety disorder: Secondary | ICD-10-CM

## 2013-01-12 DIAGNOSIS — I4891 Unspecified atrial fibrillation: Secondary | ICD-10-CM | POA: Diagnosis not present

## 2013-01-12 DIAGNOSIS — N390 Urinary tract infection, site not specified: Secondary | ICD-10-CM

## 2013-01-12 NOTE — Progress Notes (Signed)
  Subjective:    Patient ID: [name], female    DOB: 1928-04-21, 79 y.o.   MRN: [id number]  HPI ER F/U- pt went to the ER on 9/18 w/ UTI and was found to be in Afib w/ RVR.  Pt has been in touch w/ cardiology Jacinto Halim) and is now on nocturnal O2.  Is currently on Macrobid for UTI- reports suprapubic pressure is improving, back pain is improving.  Is currently in counseling Orvan July) for anxiety which pt has been told is part of the problem.  Is seeing psychiatry but she reports this is not helpful as all that occurs during these visits is her Zoloft is refilled.  Pt admits to continued anxiety.  No current palpitations, SOB, N/V/D, abd pain.   Review of Systems For ROS see HPI     Objective:   Physical Exam        Assessment & Plan:

## 2013-01-12 NOTE — Patient Instructions (Addendum)
I'm so glad that you're feeling better Finish the Macrobid as directed Follow up as scheduled w/ Dr Jacinto Halim Call with any questions or concerns Happy Early Birthday!!!

## 2013-01-13 ENCOUNTER — Ambulatory Visit (INDEPENDENT_AMBULATORY_CARE_PROVIDER_SITE_OTHER): Payer: Medicare Other | Admitting: Licensed Clinical Social Worker

## 2013-01-13 DIAGNOSIS — F411 Generalized anxiety disorder: Secondary | ICD-10-CM | POA: Diagnosis not present

## 2013-01-13 NOTE — Progress Notes (Signed)
   THERAPIST PROGRESS NOTE  Session Time: 8:30am-9:20am  Participation Level: Active  Behavioral Response: Well GroomedAlertAnxious and Euthymic  Type of Therapy: Individual Therapy  Treatment Goals addressed: Coping  Interventions: CBT, Strength-based, Supportive and Reframing  Summary: [name] is a 79 y.o. female who presents with euthymic mood and bright affect. She reports that she has been doing well, but frustrated with a recent trip to the emergency room for A Fib. She questions her decision to call 911, but is able to look back and realize that she did the correct thing to care for herself. She does not know why her heart began giving her difficulty again, but she is committed to continuing a healthy lifestyle with exercise. She processes her anxiety related to her health and living alone. She is trying to stay on top of any negative thinking and to reframe it with accurate facts. Her sleep and appetite are wnl.   Suicidal/Homicidal: Nowithout intent/plan  Therapist Response: Assessed patients current functioning and reviewed progress. Reviewed coping strategies. Assessed patients safety and assisted in identifying protective factors.  Reviewed crisis plan with patient. Assisted patient with the expression of anxiety. Reviewed patients self care plan. Assessed progress related to self care. Patients self care is very good. Recommend daily exercise, increased socialization and recreation. Reviewed healthy boundaries and assertive communication. Used CBT to assist patient with the identification of negative distortions and irrational thoughts. Encouraged patient to verbalize alternative and factual responses which challenge thought distortions.   Plan: Return again in two weeks.  Diagnosis: Axis I: Generalized Anxiety Disorder    Axis II: No diagnosis    Jashley Yellin, LCSW 01/13/2013

## 2013-01-16 NOTE — Assessment & Plan Note (Signed)
Pt was found to be in Afib w/ RVR prior to arrival in ER.  Today is rate controlled and in normal sinus rhythm.  Continues to follow w/ cards.

## 2013-01-16 NOTE — Assessment & Plan Note (Signed)
Reviewed cx results and pt is being treated appropriately.  Pt reports sxs are improving.  If sxs recur- will need to f/u w/ urology.  Pt expressed understanding and is in agreement w/ plan.

## 2013-01-16 NOTE — Assessment & Plan Note (Signed)
Unchanged.  Again feel that this is a large part of pt's problems.  Pt feels that counseling is helpful but no longer wants to see psychiatry.  Indicated that I would refill her Zoloft and as long as her therapist thought things were stable and not deteriorating, she no longer needs to see psychiatrist.  Pt expressed understanding and is in agreement w/ plan.

## 2013-01-19 ENCOUNTER — Encounter: Payer: Self-pay | Admitting: Podiatrist

## 2013-01-19 ENCOUNTER — Ambulatory Visit (INDEPENDENT_AMBULATORY_CARE_PROVIDER_SITE_OTHER): Payer: Medicare Other | Admitting: Podiatrist

## 2013-01-19 DIAGNOSIS — B351 Tinea unguium: Secondary | ICD-10-CM

## 2013-01-19 DIAGNOSIS — M79609 Pain in unspecified limb: Secondary | ICD-10-CM

## 2013-01-19 NOTE — Patient Instructions (Addendum)
Return for routine care in 3 months- call if any problems arise

## 2013-01-19 NOTE — Progress Notes (Signed)
HPI:  Patient presents today for follow up of foot and nail care. Denies any new complaints today.  Objective:  Patients chart is reviewed.  Neurovascular status unchanged.  Patients nails are thickened, discolored, distrophic, friable and brittle with yellow-brown discoloration. Patient subjectively relates they are painful with shoes and with ambulation in both feet.    Assessment:  Symptomatic onychomycosis  Plan:  Discussed treatment options and alternatives.  The symptomatic toenails were debrided through manual an mechanical means without complication.  Return appointment recommended at routine intervals of 3 months   

## 2013-01-21 ENCOUNTER — Ambulatory Visit (INDEPENDENT_AMBULATORY_CARE_PROVIDER_SITE_OTHER): Payer: Medicare Other | Admitting: Family Medicine

## 2013-01-21 VITALS — BP 110/62 | HR 65 | Temp 98.6°F | Resp 18

## 2013-01-21 DIAGNOSIS — T17308A Unspecified foreign body in larynx causing other injury, initial encounter: Secondary | ICD-10-CM | POA: Diagnosis not present

## 2013-01-21 MED ORDER — EPINEPHRINE 0.3 MG/0.3ML IJ SOAJ: 0.3000 mg | Freq: Once | INTRAMUSCULAR | Status: DC

## 2013-01-21 NOTE — Progress Notes (Signed)
[facility] [address] [place] [address] [id number]  Date:  01/21/2013   Name:  [name]   DOB:  07/20/1927   MRN:  [id number]  PCP:  Neena Rhymes, MD    Chief Complaint: trouble swallowing and Emesis   History of Present Illness:  [name] is a 79 y.o. very pleasant female patient who presents with the following:  Here today with a possible allergic reaction.  She was eating at [employer] today with a few friends for her birthday.  She had started eating- and then a few minutes later she noted that she was unable to swallow, and started "throwing up phlegm."  She had just eaten a shrimp when her sx began.  She is not allergic to shrimp per her knowledge and eats them often.   She appeared "white" to her friends, and she was not able to speak.  She did not have any lip or tongue swelling. She then went to the bathroom and threw up what seemed like a large amount of phlegm and the food she had already eaten.    Her sx stopped about an hour prior to our visit.  At this time she feels well.  No rash, no wheezing, no lip or tongue swelling.   She drank juice in the office without any difficulty.     Patient Active Problem List   Diagnosis Date Noted  . Sinusitis, acute maxillary 07/19/2012  . Musculoskeletal pain 05/21/2012  . IBS (irritable bowel syndrome) 04/02/2012  . Nail fungus 03/01/2012  . Ear bleeding 12/10/2011  . Generalized anxiety disorder 07/10/2011  . Parkinson's disease 02/03/2011  . Shoulder pain 02/03/2011  . Superficial abrasion 11/17/2010  . Recurrent UTI 11/01/2010  . GERD (gastroesophageal reflux disease) 11/01/2010  . Memory loss 11/01/2010  . COPD, mild 09/23/2010  . Hip pain, right 09/23/2010  . COMPRESSION FRACTURE, LUMBAR VERTEBRAE 03/28/2010  . ABDOMINAL PAIN 03/27/2010  . LACERATION, SCALP 03/27/2010  . Atrial fibrillation 03/22/2010  . DIZZINESS 03/22/2010  . Mycotic toenails 10/02/2009  .  DIARRHEA-PRESUMED INFECTIOUS 04/02/2009  . DYSPNEA ON EXERTION 01/19/2009  . HEMORRHOIDS-INTERNAL 02/14/2008  . CONSTIPATION 02/14/2008  . PERSONAL HX COLONIC POLYPS 02/14/2008    Past Medical History  Diagnosis Date  . Arthritis   . Hyperlipidemia   . 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 Chronic constipation   . Hemorrhoid   . Varicose veins   . Anxiety   . Depression   . GERD (gastroesophageal reflux disease)   . Atrial fibrillation   . Recurrent UTI   . Parkinson disease   . Cardiac arrhythmia due to congenital heart disease   . Mycotic toenails 10/27/2012    Past Surgical History  Procedure Laterality Date  . Appendectomy    . Ileal resection  03/2007    Due to obstruction    History  Substance Use Topics  . Smoking status: Former Smoker -- 2.00 packs/day for 30 years    Types: Cigarettes    Quit date: 04/21/1980  . Smokeless tobacco: Never Used     Comment: onset age 73 -75, up to > 1ppd (almost 2 ppd)  . Alcohol Use: 0.0 oz/week    Family History  Problem Relation Age of Onset  . Asthma Brother   . Alcohol abuse Brother   . Throat cancer Father   . Alcohol abuse Father   . Lupus Daughter   . Bipolar disorder Daughter   . Lupus  Daughter   . Anxiety disorder Daughter   . Alcohol abuse Sister   . Alcohol abuse Maternal Grandfather   . Alcohol abuse Paternal Grandmother     Allergies  Allergen Reactions  . Adhesive [Tape] Other (See Comments)    Reaction :  blisters  . Amoxicillin Other (See Comments)    Reaction : thrush  . Ciprofloxacin Other (See Comments)    Reaction : thrush  . Ultram [Tramadol]     hypotension    Medication list has been reviewed and updated.  Current Outpatient Prescriptions on File Prior to Visit  Medication Sig Dispense Refill  . albuterol (VENTOLIN HFA) 108 (90 BASE) MCG/ACT inhaler Inhale 2 puffs into the lungs 4 (four) times daily as needed. For shortness of breath      . Calcium Carbonate-Vitamin D (CALCIUM  600+D) 600-400 MG-UNIT per tablet Take 1 tablet by mouth 2 (two) times daily.        . celecoxib (CELEBREX) 200 MG capsule Take 200 mg by mouth daily.      . Cranberry-Vitamin C-Vitamin E (CRANBERRY SUPER STRENGTH) 6000-100-3 MG-MG-UNIT CAPS Take 1 capsule by mouth every morning.        . digoxin (LANOXIN) 0.125 MG tablet Take 125 mcg by mouth daily.        . fish oil-omega-3 fatty acids 1000 MG capsule Take 1 g by mouth every morning.       . Hypromellose (ARTIFICIAL TEARS OP) Apply 1 drop to eye 2 (two) times daily as needed (dry eye).      . mupirocin ointment (BACTROBAN) 2 % Apply topically 3 (three) times daily.  22 g  0  . sertraline (ZOLOFT) 100 MG tablet Take 1 tablet (100 mg total) by mouth daily.  90 tablet  0  . warfarin (COUMADIN) 2.5 MG tablet Take 2.5-5 mg by mouth at bedtime. Mon Wed and Sat take 5mg , take 2.5mg  all other days      . nitrofurantoin, macrocrystal-monohydrate, (MACROBID) 100 MG capsule Take 1 capsule (100 mg total) by mouth 2 (two) times daily.  14 capsule  0   No current facility-administered medications on file prior to visit.    Review of Systems:  As per HPI- otherwise negative.  Physical Examination: Filed Vitals:   01/21/13 1740  BP: 110/62  Pulse: 74  Temp: 98.6 F (37 C)  Resp: 18   There were no vitals filed for this visit. There is no weight on file to calculate BMI. Ideal Body Weight:    GEN: WDWN, NAD, Non-toxic, A & O x 3, looks well, accompanied by 2 friends.   HEENT: Atraumatic, Normocephalic. Neck supple. No masses, No LAD.  Bilateral TM wnl, oropharynx normal.  PEERL,EOMI.   No angioedema, no lip or tongue swelling, no hives.   Ears and Nose: No external deformity. CV: RRR, No M/G/R. No JVD. No thrill. No extra heart sounds.  Rate is controlled and she seems to be in sinus rhythm PULM: CTA B, no wheezes, crackles, rhonchi. No retractions. No resp. distress. No accessory muscle use. ABD: S, NT, ND EXTR: No c/c/e NEURO Normal gait.   PSYCH: Normally interactive. Conversant. Not depressed or anxious appearing.  Calm demeanor.   Observed pt in clinic for about 45 minutes.  She continued to feel well and be asymptomatic.  VS remained stable and normal Assessment and Plan: Choking, initial encounter - Plan: EPINEPHrine (EPI-PEN) 0.3 mg/0.3 mL SOAJ injection  Possible choking episode. No evidence of any allergic reaction.  At this time she looks well, and her sx have resolved.  Did rx an epipen to have on hand in case of any recurrent sx or sign of anaphylaxis.  She knows to get help immediately if she has signs of distress.     Signed Abbe Amsterdam, MD

## 2013-01-21 NOTE — Patient Instructions (Addendum)
Keep a close eye on your symptoms. If your problem comes back call for help.    I will give you an epi-pen rx.  If you were to have any lip or tongue swelling that impeded your breathing use the epi- pen

## 2013-01-26 ENCOUNTER — Ambulatory Visit (INDEPENDENT_AMBULATORY_CARE_PROVIDER_SITE_OTHER): Payer: Medicare Other | Admitting: Licensed Clinical Social Worker

## 2013-01-26 DIAGNOSIS — F411 Generalized anxiety disorder: Secondary | ICD-10-CM | POA: Diagnosis not present

## 2013-01-26 NOTE — Progress Notes (Signed)
   THERAPIST PROGRESS NOTE  Session Time: 8:30am-9:20am  Participation Level: Active  Behavioral Response: Well GroomedAlertAnxious  Type of Therapy: Individual Therapy  Treatment Goals addressed: Coping  Interventions: CBT, Strength-based, Supportive and Reframing  Summary: [name] is a 79 y.o. female who presents with anxious mood and affect. She reports feeling tired and overwhelmed by the many activities she keeps herself busy with. She wants to slow down, but struggles to do this. She questions why she has such difficulty allowing herself to slow down. She has plans today to attend yoga and then a movie, but is too tired. She processes her anxiety about calling her friend to cancel. She endorses fear that her friend won't understand and that she will not longer want to spend time with patient. She is anxious about loosing contact with people. Her cognitive distortions are strong with negative self talk. Her sleep and appetite are wnl.   Suicidal/Homicidal: Nowithout intent/plan  Therapist Response: Assessed patients current functioning and reviewed progress. Reviewed coping strategies. Assessed patients safety and assisted in identifying protective factors.  Reviewed crisis plan with patient. Assisted patient with the expression of anxiety. Reviewed patients self care plan. Assessed progress related to self care. Patients self care is good. Recommend daily exercise, increased socialization and recreation. Used CBT to assist patient with the identification of negative distortions and irrational thoughts. Encouraged patient to verbalize alternative and factual responses which challenge thought distortions. Reviewed healthy boundaries and assertive communication.   Plan: Return again in two weeks.  Diagnosis: Axis I: Generalized Anxiety Disorder    Axis II: No diagnosis    Johnpaul Gillentine, LCSW 01/26/2013

## 2013-01-31 ENCOUNTER — Ambulatory Visit (INDEPENDENT_AMBULATORY_CARE_PROVIDER_SITE_OTHER): Payer: Medicare Other | Admitting: Family Medicine

## 2013-01-31 ENCOUNTER — Encounter: Payer: Self-pay | Admitting: Family Medicine

## 2013-01-31 VITALS — BP 108/64 | HR 68 | Temp 98.5°F | Resp 16 | Wt 159.0 lb

## 2013-01-31 DIAGNOSIS — K219 Gastro-esophageal reflux disease without esophagitis: Secondary | ICD-10-CM | POA: Diagnosis not present

## 2013-01-31 DIAGNOSIS — N390 Urinary tract infection, site not specified: Secondary | ICD-10-CM | POA: Diagnosis not present

## 2013-01-31 LAB — POCT URINALYSIS DIPSTICK
Bilirubin, UA: NEGATIVE
Blood, UA: NEGATIVE
Ketones, UA: NEGATIVE
Spec Grav, UA: <=1.005
pH, UA: 7

## 2013-01-31 MED ORDER — CEPHALEXIN 500 MG PO CAPS: 500.0000 mg | ORAL_CAPSULE | Freq: Two times a day (BID) | ORAL | Status: AC

## 2013-01-31 MED ORDER — TRIMETHOPRIM 100 MG PO TABS: 100.0000 mg | ORAL_TABLET | Freq: Every day | ORAL | Status: DC

## 2013-01-31 MED ORDER — PANTOPRAZOLE SODIUM 40 MG PO TBEC: 40.0000 mg | DELAYED_RELEASE_TABLET | Freq: Every day | ORAL | Status: DC

## 2013-01-31 NOTE — Patient Instructions (Signed)
Start the Keflex twice daily for the UTI Drink plenty of fluids After you finish the Keflex start the Trimethoprim nightly to prevent UTIs DO NOT take the Nitrofurantoin while taking the Trimethoprim Call with any questions or concerns Happy Belated Birthday!!!

## 2013-01-31 NOTE — Assessment & Plan Note (Signed)
Pt again w/ sxs of UTI.  Start keflex.  Once finished w/ abx course will restart trimethoprim as daily prophylaxis.  Reviewed supportive care and red flags that should prompt return.  Pt expressed understanding and is in agreement w/ plan.

## 2013-01-31 NOTE — Assessment & Plan Note (Signed)
Deteriorated.  Start protonix daily.

## 2013-01-31 NOTE — Progress Notes (Signed)
  Subjective:    Patient ID: ROSEZETTA BALDERSTON, female    DOB: 1927-10-08, 79 y.o.   MRN: [id number]  HPI UTI- pt w/ recurrent UTIs, has been seeing urology.  Was told to come here rather than go to their office.  Has been taking Nitrofurantoin as a prophylaxis but 'this doesn't do much'.  Previously took Trimetoprim 100mg  as daily prophylaxis.  Having suprapubic pressure, back pain.  Increased frequency of urination.  Some dysuria.  GERD- worsening recently, pt now actually vomiting.  Finally interested in taking medication to control sxs  Review of Systems For ROS see HPI     Objective:   Physical Exam  Vitals reviewed. Constitutional: She appears well-developed and well-nourished. No distress.  Abdominal: Soft. She exhibits no distension. There is no tenderness (no suprapubic or CVA tenderness).          Assessment & Plan:

## 2013-02-02 DIAGNOSIS — Z23 Encounter for immunization: Secondary | ICD-10-CM | POA: Diagnosis not present

## 2013-02-02 DIAGNOSIS — Z7901 Long term (current) use of anticoagulants: Secondary | ICD-10-CM | POA: Diagnosis not present

## 2013-02-02 LAB — URINE CULTURE

## 2013-02-15 ENCOUNTER — Ambulatory Visit (INDEPENDENT_AMBULATORY_CARE_PROVIDER_SITE_OTHER): Payer: Medicare Other | Admitting: Licensed Clinical Social Worker

## 2013-02-15 DIAGNOSIS — F411 Generalized anxiety disorder: Secondary | ICD-10-CM

## 2013-02-15 DIAGNOSIS — Z7901 Long term (current) use of anticoagulants: Secondary | ICD-10-CM | POA: Diagnosis not present
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
## 2013-02-23 DIAGNOSIS — R0902 Hypoxemia: Secondary | ICD-10-CM | POA: Diagnosis not present

## 2013-02-23 DIAGNOSIS — I4891 Unspecified atrial fibrillation: Secondary | ICD-10-CM | POA: Diagnosis not present

## 2013-02-23 DIAGNOSIS — R5381 Other malaise: Secondary | ICD-10-CM | POA: Diagnosis not present

## 2013-02-24 ENCOUNTER — Other Ambulatory Visit: Payer: Self-pay

## 2013-03-02 DIAGNOSIS — Z7901 Long term (current) use of anticoagulants: Secondary | ICD-10-CM | POA: Diagnosis not present

## 2013-03-03 ENCOUNTER — Ambulatory Visit (INDEPENDENT_AMBULATORY_CARE_PROVIDER_SITE_OTHER): Payer: Medicare Other | Admitting: Psychiatry

## 2013-03-03 ENCOUNTER — Encounter (HOSPITAL_COMMUNITY): Payer: Self-pay | Admitting: Psychiatry

## 2013-03-03 VITALS — BP 99/66 | HR 62 | Ht 71.0 in | Wt 156.0 lb

## 2013-03-03 DIAGNOSIS — F411 Generalized anxiety disorder: Secondary | ICD-10-CM

## 2013-03-03 DIAGNOSIS — F419 Anxiety disorder, unspecified: Secondary | ICD-10-CM

## 2013-03-03 MED ORDER — SERTRALINE HCL 100 MG PO TABS: 100.0000 mg | ORAL_TABLET | Freq: Every day | ORAL | Status: DC

## 2013-03-03 NOTE — Progress Notes (Signed)
[facility] [id number] Progress Note  Allyse Fregeau [id number] 79 y.o.  03/03/2013 11:26 AM  Chief Complaint:  Medication management and followup.    History of Present Illness:  Blakely came for her followup appointment.  She is taking Zoloft 100 mg daily.  She appears in his usual very anxious and nervous.  She has multiple doctor's appointment.  She is very concerned about her health.  Recently she saw her cardiologist will change her cardiac medication.  She is taking now diltazium and she was recommended to stop digoxin.  Patient has chronic health issues.  She is very worried about her future and finances.  She has multiple doctor's appointment.  She admitted getting nervous and anxious easily.  We have discussed many times to increase the Zoloft but patient does not want to increase the Zoloft.  She seeing therapist.  She denies any recent crying spells or any hallucination.  She has no tremors or shakes.  She sleeps better and denies any recent suicidal thoughts or feelings of hopelessness.  Suicidal Ideation: No Plan Formed: No Patient has means to carry out plan: No  Homicidal Ideation: No Plan Formed: No Patient has means to carry out plan: No  Review of Systems  HENT: Negative.   Musculoskeletal: Positive for back pain and joint pain.  Psychiatric/Behavioral: The patient is nervous/anxious.     Psychiatric: Agitation: No Hallucination: No Depressed Mood: No Insomnia: No Hypersomnia: No Altered Concentration: No Feels Worthless: No Grandiose Ideas: No Belief In Special Powers: No New/Increased Substance Abuse: No Compulsions: No  Neurologic: Headache: Yes Seizure: No Paresthesias: No  Past Medical Family, Social History: Patient has history of arthritis, hyperlipidemia, hemorrhoid, chronic constipation, GERD, recurrent UTI, Parkinson disease, chronic arrhythmia.  She see physician at [facility].  Patient has 2 daughter.  Her parents are  deceased.  Outpatient Encounter Prescriptions as of 03/03/2013  Medication Sig  . albuterol (VENTOLIN HFA) 108 (90 BASE) MCG/ACT inhaler Inhale 2 puffs into the lungs 4 (four) times daily as needed. For shortness of breath  . Calcium Carbonate-Vitamin D (CALCIUM 600+D) 600-400 MG-UNIT per tablet Take 1 tablet by mouth 2 (two) times daily.    . celecoxib (CELEBREX) 200 MG capsule Take 200 mg by mouth daily.  . Cranberry-Vitamin C-Vitamin E (CRANBERRY SUPER STRENGTH) 6000-100-3 MG-MG-UNIT CAPS Take 1 capsule by mouth every morning.    . digoxin (LANOXIN) 0.125 MG tablet Take 125 mcg by mouth daily.    Marland Kitchen diltiazem (CARDIZEM) 30 MG tablet   . EPINEPHrine (EPI-PEN) 0.3 mg/0.3 mL SOAJ injection Inject 0.3 mLs (0.3 mg total) into the muscle once.  . fish oil-omega-3 fatty acids 1000 MG capsule Take 1 g by mouth every morning.   . Hypromellose (ARTIFICIAL TEARS OP) Apply 1 drop to eye 2 (two) times daily as needed (dry eye).  . mupirocin ointment (BACTROBAN) 2 % Apply topically 3 (three) times daily.  . pantoprazole (PROTONIX) 40 MG tablet Take 1 tablet (40 mg total) by mouth daily.  . propafenone (RYTHMOL) 150 MG tablet   . sertraline (ZOLOFT) 100 MG tablet Take 1 tablet (100 mg total) by mouth daily.  Marland Kitchen trimethoprim (TRIMPEX) 100 MG tablet Take 1 tablet (100 mg total) by mouth at bedtime.  Marland Kitchen warfarin (COUMADIN) 2.5 MG tablet Take 2.5-5 mg by mouth at bedtime. Mon Wed and Sat take 5mg , take 2.5mg  all other days  . [DISCONTINUED] sertraline (ZOLOFT) 100 MG tablet Take 1 tablet (100 mg total) by mouth daily.  . [DISCONTINUED] sertraline (ZOLOFT)  100 MG tablet Take 1 tablet (100 mg total) by mouth daily.    Past Psychiatric History/Hospitalization(s): Anxiety: Yes Bipolar Disorder: No Depression: No Mania: No Psychosis: No Schizophrenia: No Personality Disorder: No Hospitalization for psychiatric illness: No History of Electroconvulsive Shock Therapy: No Prior Suicide Attempts: No  Physical  Exam: Constitutional:  BP 99/66  Pulse 62  Ht 5\' 11"  (1.803 m)  Wt 156 lb (70.761 kg)  BMI 21.77 kg/m2  Recent Results (from the past 2160 hour(s))  URINALYSIS, ROUTINE W REFLEX MICROSCOPIC     Status: Abnormal   Collection Time    01/06/13 11:38 AM      Result Value Range   Color, Urine YELLOW  YELLOW   APPearance CLOUDY (*) CLEAR   Specific Gravity, Urine 1.008  1.005 - 1.030   pH 7.0  5.0 - 8.0   Glucose, UA NEGATIVE  NEGATIVE mg/dL   Hgb urine dipstick NEGATIVE  NEGATIVE   Bilirubin Urine NEGATIVE  NEGATIVE   Ketones, ur NEGATIVE  NEGATIVE mg/dL   Protein, ur NEGATIVE  NEGATIVE mg/dL   Urobilinogen, UA 0.2  0.0 - 1.0 mg/dL   Nitrite POSITIVE (*) NEGATIVE   Leukocytes, UA LARGE (*) NEGATIVE  URINE MICROSCOPIC-ADD ON     Status: Abnormal   Collection Time    01/06/13 11:38 AM      Result Value Range   Squamous Epithelial / LPF RARE  RARE   WBC, UA 21-50  <3 WBC/hpf   Bacteria, UA MANY (*) RARE  URINE CULTURE     Status: None   Collection Time    01/06/13 11:38 AM      Result Value Range   Specimen Description URINE, RANDOM     Special Requests NONE     Culture  Setup Time       Value: 01/06/2013 12:56     Performed at [facility] Count       Value: >=100,000 COLONIES/ML     Performed at [facility]   Culture       Value: ESCHERICHIA COLI     Performed at [facility]   Report Status 01/08/2013 FINAL     Organism ID, Bacteria ESCHERICHIA COLI    CBC WITH DIFFERENTIAL     Status: None   Collection Time    01/06/13 11:41 AM      Result Value Range   WBC 8.5  4.0 - 10.5 K/uL   RBC 4.32  3.87 - 5.11 MIL/uL   Hemoglobin 13.1  12.0 - 15.0 g/dL   HCT 16.1  09.6 - 04.5 %   MCV 92.8  78.0 - 100.0 fL   MCH 30.3  26.0 - 34.0 pg   MCHC 32.7  30.0 - 36.0 g/dL   RDW 40.9  81.1 - 91.4 %   Platelets 228  150 - 400 K/uL   Neutrophils Relative % 75  43 - 77 %   Neutro Abs 6.4  1.7 - 7.7 K/uL   Lymphocytes Relative 18  12 - 46 %    Lymphs Abs 1.5  0.7 - 4.0 K/uL   Monocytes Relative 6  3 - 12 %   Monocytes Absolute 0.5  0.1 - 1.0 K/uL   Eosinophils Relative 1  0 - 5 %   Eosinophils Absolute 0.1  0.0 - 0.7 K/uL   Basophils Relative 0  0 - 1 %   Basophils Absolute 0.0  0.0 - 0.1 K/uL  BASIC METABOLIC PANEL  Status: Abnormal   Collection Time    01/06/13 11:41 AM      Result Value Range   Sodium 142  135 - 145 mEq/L   Potassium 4.0  3.5 - 5.1 mEq/L   Chloride 102  96 - 112 mEq/L   CO2 33 (*) 19 - 32 mEq/L   Glucose, Bld 85  70 - 99 mg/dL   BUN 12  6 - 23 mg/dL   Creatinine, Ser 1.61  0.50 - 1.10 mg/dL   Calcium 9.2  8.4 - 09.6 mg/dL   GFR calc non Af Amer 79 (*) >90 mL/min   GFR calc Af Amer >90  >90 mL/min   Comment: (NOTE)     The eGFR has been calculated using the CKD EPI equation.     This calculation has not been validated in all clinical situations.     eGFR's persistently <90 mL/min signify possible Chronic Kidney     Disease.  PROTIME-INR     Status: Abnormal   Collection Time    01/06/13 11:41 AM      Result Value Range   Prothrombin Time 23.3 (*) 11.6 - 15.2 seconds   INR 2.15 (*) 0.00 - 1.49  POCT URINALYSIS DIPSTICK     Status: None   Collection Time    01/31/13  2:29 PM      Result Value Range   Color, UA lt yellow     Clarity, UA hazy     Glucose, UA negative     Bilirubin, UA negative     Ketones, UA negative     Spec Grav, UA <=1.005     Blood, UA negative     pH, UA 7.0     Protein, UA trace     Urobilinogen, UA 0.2     Nitrite, UA negative     Leukocytes, UA Trace    URINE CULTURE     Status: None   Collection Time    01/31/13  5:03 PM      Result Value Range   Colony Count 15,000 COLONIES/ML     Organism ID, Bacteria Multiple bacterial morphotypes present, none     Organism ID, Bacteria predominant. Suggest appropriate recollection if      Organism ID, Bacteria clinically indicated.      General Appearance: alert, oriented, no acute distress and Casually dressed .   She is tall and thin woman.    Musculoskeletal: Strength & Muscle Tone: decreased Gait & Station: shuffle Patient leans: Front  Mental status examination Patient is a thin lean person who is well groomed casually dressed.  She is anxious but cooperative.  Her speech is fast with rapid volume and tone.  Her thought process is circumstantial.  There were no tremors or shakes present per her psychomotor activity is slightly increased.  She maintained fair eye contact.  Her attention and concentration is fair.   She described her mood is anxious and her affect is mood appropriate.  She denies any auditory hallucination but admitted visual hallucination and seeing shadows.  She denies any paranoia or any delusion at this time.  There were no flight of ideas.  She's alert and oriented x3.  However she do not recall very well remote memory.  Her insight judgment and impulse control is okay.  Medical Decision Making (Choose Three): Established Problem, Stable/Improving (1), Review of Psycho-Social Stressors (1), Review or order clinical lab tests (1), Decision to obtain old records (1), Review of Last Therapy Session (  1) and Review of Medication Regimen & Side Effects (2)  Assessment: Axis I: Anxiety disorder  Axis II: Deferred  Axis III: History of arthritis, hyperlipidemia, hemorrhoid, chronic constipation, GERD, recurrent UTI, Parkinson disease, chronic arrhythmia.  Recent back and shoulder pain.  Axis IV: Mild to moderate  Axis V: 60-65   Plan:  I review her blood work and notes from her primary care physician.  Patient is given as patient has a lot of question about her current medication .  Recommend to increase Zoloft however patient like to continue current dosage .  Patient does not have any side effects.  Recommend to see therapist for coping and social skills.  Followup in 3 months.  Time spent 25 minutes.  More than 50% of the time spent in psychoeducation, counseling and coordination of  care.  Discuss safety plan that anytime having active suicidal thoughts or homicidal thoughts then patient need to call 911 or go to the local emergency room.  Richmond Coldren T., MD 03/03/2013

## 2013-03-07 ENCOUNTER — Ambulatory Visit (INDEPENDENT_AMBULATORY_CARE_PROVIDER_SITE_OTHER): Payer: Medicare Other | Admitting: Licensed Clinical Social Worker

## 2013-03-07 DIAGNOSIS — F411 Generalized anxiety disorder: Secondary | ICD-10-CM | POA: Diagnosis not present

## 2013-03-07 NOTE — Progress Notes (Signed)
   THERAPIST PROGRESS NOTE  Session Time: 9:30am-10:20am  Participation Level: Active  Behavioral Response: Well GroomedAlertAnxious  Type of Therapy: Individual Therapy  Treatment Goals addressed: Anxiety  Interventions: CBT, Strength-based, Supportive and Reframing  Summary: [name] is a 79 y.o. female who presents with anxious mood and congruent affect. She reports increased anxiety, frustration, self doubt and cognitive distortions lately. She calls herself stupid often today and expresses anger that she occasionally gets sick and has to cancel her commitments. She contemplates the option of not making commitments in order to avoid her anxiety.  Her negative distortions are strong today. She needs reassurance and redirection. Her sleep and appetite are wnl.  Suicidal/Homicidal: Nowithout intent/plan  Therapist Response: Assessed patients current functioning and reviewed progress. Reviewed coping strategies. Assessed patients safety and assisted in identifying protective factors.  Reviewed crisis plan with patient. Assisted patient with the expression of anxiety. Reviewed patients self care plan. Assessed progress related to self care. Patients self care is good. Recommend daily exercise, increased socialization and recreation. Used CBT to assist patient with the identification of negative distortions and irrational thoughts. Encouraged patient to verbalize alternative and factual responses which challenge thought distortions. Used motivational interviewing to assist and encourage patient through the change process. Explored patients barriers to change.   Plan: Return again in three weeks.  Diagnosis: Axis I: Generalized Anxiety Disorder    Axis II: No diagnosis    Catherine Vasques, LCSW 03/07/2013

## 2013-03-08 DIAGNOSIS — H35379 Puckering of macula, unspecified eye: Secondary | ICD-10-CM | POA: Diagnosis not present

## 2013-03-08 DIAGNOSIS — H26499 Other secondary cataract, unspecified eye: Secondary | ICD-10-CM | POA: Diagnosis not present

## 2013-03-09 ENCOUNTER — Telehealth (HOSPITAL_COMMUNITY): Payer: Self-pay

## 2013-03-09 ENCOUNTER — Other Ambulatory Visit (HOSPITAL_COMMUNITY): Payer: Self-pay | Admitting: Psychiatry

## 2013-03-09 DIAGNOSIS — M25559 Pain in unspecified hip: Secondary | ICD-10-CM | POA: Diagnosis not present

## 2013-03-09 DIAGNOSIS — F419 Anxiety disorder, unspecified: Secondary | ICD-10-CM

## 2013-03-09 DIAGNOSIS — M545 Low back pain: Secondary | ICD-10-CM | POA: Diagnosis not present

## 2013-03-10 DIAGNOSIS — L821 Other seborrheic keratosis: Secondary | ICD-10-CM | POA: Diagnosis not present

## 2013-03-10 DIAGNOSIS — L570 Actinic keratosis: Secondary | ICD-10-CM | POA: Diagnosis not present

## 2013-03-10 DIAGNOSIS — Z85828 Personal history of other malignant neoplasm of skin: Secondary | ICD-10-CM | POA: Diagnosis not present

## 2013-03-10 DIAGNOSIS — I789 Disease of capillaries, unspecified: Secondary | ICD-10-CM | POA: Diagnosis not present

## 2013-03-10 DIAGNOSIS — D1801 Hemangioma of skin and subcutaneous tissue: Secondary | ICD-10-CM | POA: Diagnosis not present

## 2013-04-01 DIAGNOSIS — Z7901 Long term (current) use of anticoagulants: Secondary | ICD-10-CM | POA: Diagnosis not present

## 2013-04-04 ENCOUNTER — Ambulatory Visit (INDEPENDENT_AMBULATORY_CARE_PROVIDER_SITE_OTHER): Payer: Medicare Other | Admitting: Licensed Clinical Social Worker

## 2013-04-04 DIAGNOSIS — F411 Generalized anxiety disorder: Secondary | ICD-10-CM | POA: Diagnosis not present

## 2013-04-04 NOTE — Progress Notes (Signed)
   THERAPIST PROGRESS NOTE  Session Time: 10:30am-11:20am  Participation Level: Active  Behavioral Response: Well GroomedAlertAnxious  Type of Therapy: Individual Therapy  Treatment Goals addressed: Anxiety, Communication: reducing negative thought distortions, maintaining boundaires with her daughter [name], reducation of questioning of her own choices and Coping  Interventions: CBT, Strength-based, Supportive and Reframing  Summary: [name] is a 79 y.o. female who presents with anxious mood and congruent affect. She reports increased feelings of anxiety, stress and frustration lately. She expresses anxiety and fear related to visiting her daughters over Christmas. She states that her daughters are not getting along and that she feels unsafe around [name] due to her anger. She questions if she should visit [name] in [facility] on her own and explores her fears and doubts. She finds it naturally difficult to navigate this conflict. She is working on her goals of setting firmer limits. She needs redirection and reassurance to reduce her anxiety.   Suicidal/Homicidal: Nowithout intent/plan  Therapist Response: Assessed patients current functioning and reviewed progress. Reviewed coping strategies. Assessed patients safety and assisted in identifying protective factors.  Reviewed crisis plan with patient. Assisted patient with the expression of anxiety, frustration and self doubt. Reviewed patients self care plan. Assessed progress related to self care. Patients self care is good. Recommend daily exercise, increased socialization and recreation. Used CBT to assist patient with the identification of negative distortions and irrational thoughts. Encouraged patient to verbalize alternative and factual responses which challenge thought distortions. Reviewed healthy boundaries and assertive communication.   Plan: Return again in two  weeks.  Diagnosis: Axis I: Generalized Anxiety Disorder    Axis  II: No diagnosis    Yuvan Medinger, LCSW 04/04/2013

## 2013-04-27 ENCOUNTER — Encounter: Payer: Self-pay | Admitting: Podiatrist

## 2013-04-27 ENCOUNTER — Ambulatory Visit (INDEPENDENT_AMBULATORY_CARE_PROVIDER_SITE_OTHER): Payer: Medicare Other | Admitting: Podiatrist

## 2013-04-27 VITALS — BP 114/67 | HR 68 | Resp 18

## 2013-04-27 DIAGNOSIS — M79609 Pain in unspecified limb: Secondary | ICD-10-CM | POA: Diagnosis not present

## 2013-04-27 DIAGNOSIS — B351 Tinea unguium: Secondary | ICD-10-CM | POA: Diagnosis not present

## 2013-04-27 NOTE — Progress Notes (Signed)
HPI:  Patient presents today for follow up of foot and nail care. Denies any new complaints today.  Objective:  Patients chart is reviewed.  Neurovascular status unchanged.  Patients nails are thickened, discolored, distrophic, friable and brittle with yellow-brown discoloration. Patient subjectively relates they are painful with shoes and with ambulation of bilateral feet.  Assessment:  Symptomatic onychomycosis  Plan:  Discussed treatment options and alternatives.  The symptomatic toenails were debrided through manual an mechanical means without complication.  Return appointment recommended at routine intervals of 3 months    Trudie Buckler, DPM

## 2013-04-28 ENCOUNTER — Ambulatory Visit (INDEPENDENT_AMBULATORY_CARE_PROVIDER_SITE_OTHER): Payer: Medicare Other | Admitting: Licensed Clinical Social Worker

## 2013-04-28 DIAGNOSIS — F411 Generalized anxiety disorder: Secondary | ICD-10-CM

## 2013-04-28 NOTE — Progress Notes (Signed)
   THERAPIST PROGRESS NOTE  Session Time: 10:30am-11:20am  Participation Level: Active  Behavioral Response: Well GroomedAlertEuthymic  Type of Therapy: Individual Therapy  Treatment Goals addressed: Coping  Interventions: CBT, Strength-based, Supportive and Reframing  Summary: [name] is a 80 y.o. female who presents with euthymic mood and bright affect. She enjoyed her holiday visit to see her children. She was able to uphold her boundaries with her daughter and did not travel to [place]. She processes her ongoing sadness that her children don't get along, but she reports improved feelings about this and a recognition that she is unable to control this. She continues to struggle with self doubt over her feelings and decision making. Her sleep and appetite are wnl.    Suicidal/Homicidal: Nowithout intent/plan  Therapist Response: Assessed patients current functioning and reviewed progress. Reviewed coping strategies. Assessed patients safety and assisted in identifying protective factors.  Reviewed crisis plan with patient. Assisted patient with the expression of frustration. Reviewed patients self care plan. Assessed progress related to self care. Patients self care is excellent. Recommend daily exercise, increased socialization and recreation. Used CBT to assist patient with the identification of negative distortions and irrational thoughts. Encouraged patient to verbalize alternative and factual responses which challenge thought distortions. Reviewed healthy boundaries and assertive communication.   Plan: Return again in two weeks.  Diagnosis: Axis I: Generalized Anxiety Disorder    Axis II: No diagnosis    Keziah Drotar, LCSW 04/28/2013

## 2013-05-04 DIAGNOSIS — Z7901 Long term (current) use of anticoagulants: Secondary | ICD-10-CM | POA: Diagnosis not present

## 2013-05-18 ENCOUNTER — Ambulatory Visit (INDEPENDENT_AMBULATORY_CARE_PROVIDER_SITE_OTHER): Payer: Medicare Other | Admitting: Licensed Clinical Social Worker

## 2013-05-18 DIAGNOSIS — Z7901 Long term (current) use of anticoagulants: Secondary | ICD-10-CM | POA: Diagnosis not present

## 2013-05-18 DIAGNOSIS — F411 Generalized anxiety disorder: Secondary | ICD-10-CM | POA: Diagnosis not present

## 2013-05-18 NOTE — Progress Notes (Signed)
   THERAPIST PROGRESS NOTE  Session Time: 10:30am-11:20am  Participation Level: Active  Behavioral Response: Well GroomedAlertAnxious  Type of Therapy: Individual Therapy  Treatment Goals addressed: Anxiety and Coping  Interventions: CBT, Strength-based, Supportive and Reframing  Summary: [name] is a 80 y.o. female who presents with anxious mood and affect. She reports that she has been feeling well physically and that her focus remains her health so she can continue to participate in activities she enjoys. She remains anxious about her daughter [name] and processes her feelings of helplessness. Informed patient of this writers departure and processed her anxiety over this news. Will see patient once more and will tranfer to [facility] at that time.   Suicidal/Homicidal: Nowithout intent/plan  Therapist Response: Assessed patients current functioning and reviewed progress. Reviewed coping strategies. Assessed patients safety and assisted in identifying protective factors.  Reviewed crisis plan with patient. Assisted patient with the expression of anxiety. Reviewed patients self care plan. Assessed progress related to self care. Patients self care is good. Recommend daily exercise, increased socialization and recreation. Used CBT to assist patient with the identification of negative distortions and irrational thoughts. Encouraged patient to verbalize alternative and factual responses which challenge thought distortions.   Plan: Return again in two weeks.  Diagnosis: Axis I: Generalized Anxiety Disorder    Axis II: No diagnosis    Tenzin Edelman, LCSW 05/18/2013

## 2013-05-22 ENCOUNTER — Other Ambulatory Visit: Payer: Self-pay | Admitting: Family Medicine

## 2013-05-23 ENCOUNTER — Ambulatory Visit (HOSPITAL_COMMUNITY): Payer: Self-pay | Admitting: Psychiatry

## 2013-05-23 NOTE — Telephone Encounter (Signed)
Med filled.  

## 2013-05-29 ENCOUNTER — Other Ambulatory Visit: Payer: Self-pay | Admitting: Family Medicine

## 2013-05-30 NOTE — Telephone Encounter (Signed)
Med filled.  

## 2013-06-01 ENCOUNTER — Ambulatory Visit (INDEPENDENT_AMBULATORY_CARE_PROVIDER_SITE_OTHER): Payer: Medicare Other | Admitting: Licensed Clinical Social Worker

## 2013-06-01 DIAGNOSIS — Z7901 Long term (current) use of anticoagulants: Secondary | ICD-10-CM | POA: Diagnosis not present

## 2013-06-01 DIAGNOSIS — F411 Generalized anxiety disorder: Secondary | ICD-10-CM

## 2013-06-01 NOTE — Progress Notes (Signed)
   THERAPIST PROGRESS NOTE  Session Time: 1:00pm-1:50pm  Participation Level: Active  Behavioral Response: Well GroomedAlertAnxious  Type of Therapy: Individual Therapy  Treatment Goals addressed: Coping  Interventions: CBT, Strength-based, Supportive and Reframing  Summary: [name] is a 80 y.o. female who presents with anxious mood and affect. She processes her ongoing frustration with herself and her social interactions with others. She questions why she behaves the way she does and often judges her feelings and choices. She questions if she should continue therapy. Processed her progress in treatment here and encouraged patient to continue.   Suicidal/Homicidal: Nowithout intent/plan  Therapist Response: Assessed patients current functioning and reviewed progress. Reviewed coping strategies. Assessed patients safety and assisted in identifying protective factors.  Reviewed crisis plan with patient. Assisted patient with the expression of anxiety. Reviewed patients self care plan. Assessed progress related to self care. Patients self care is very good. Recommend daily exercise, increased socialization and recreation. Used CBT to assist patient with the identification of negative distortions and irrational thoughts. Encouraged patient to verbalize alternative and factual responses which challenge thought distortions.   Plan: Referred to [place] for ongoing therapy.  Diagnosis: Axis I: Generalized Anxiety Disorder    Axis II: No diagnosis    Tashima Scarpulla, LCSW 06/01/2013

## 2013-06-03 ENCOUNTER — Encounter: Payer: Self-pay | Admitting: Family Medicine

## 2013-06-03 ENCOUNTER — Ambulatory Visit (INDEPENDENT_AMBULATORY_CARE_PROVIDER_SITE_OTHER): Payer: Medicare Other | Admitting: Family Medicine

## 2013-06-03 VITALS — BP 106/64 | HR 68 | Temp 98.0°F | Resp 16 | Wt 164.0 lb

## 2013-06-03 DIAGNOSIS — N390 Urinary tract infection, site not specified: Secondary | ICD-10-CM | POA: Diagnosis not present

## 2013-06-03 DIAGNOSIS — K219 Gastro-esophageal reflux disease without esophagitis: Secondary | ICD-10-CM

## 2013-06-03 DIAGNOSIS — F419 Anxiety disorder, unspecified: Secondary | ICD-10-CM

## 2013-06-03 DIAGNOSIS — F411 Generalized anxiety disorder: Secondary | ICD-10-CM | POA: Diagnosis not present

## 2013-06-03 MED ORDER — SERTRALINE HCL 100 MG PO TABS: ORAL_TABLET | ORAL | Status: DC

## 2013-06-03 MED ORDER — TRIMETHOPRIM 100 MG PO TABS: ORAL_TABLET | ORAL | Status: DC

## 2013-06-03 NOTE — Progress Notes (Signed)
   Subjective:    Patient ID: [name], female    DOB: 1928/02/25, 80 y.o.   MRN: [id number]  HPI GAD- chronic problem, currently in counseling.  On Zoloft daily but pt doesn't feel that this is working at this point.  Therapist told her she needs to 'have more realistic expectations'.  GERD- chronic problem, has been on Pantoprazole w/ good relief but would like to stop meds if possible.  Pt reports she has been off meds for 1-2 weeks and sxs have been well controlled unless she bends forward after eating or lies down.  Recurrent UTI- pt is currently on Trimethoprim w/ good control of UTIs.  Read an article on D mannose and was wondering about switching.   Review of Systems For ROS see HPI     Objective:   Physical Exam  Vitals reviewed. Constitutional: She is oriented to person, place, and time. She appears well-developed and well-nourished. No distress.  HENT:  Head: Normocephalic and atraumatic.  Eyes: Conjunctivae and EOM are normal. Pupils are equal, round, and reactive to light.  Neck: Normal range of motion. Neck supple. No thyromegaly present.  Cardiovascular: Normal rate and intact distal pulses.   No murmur heard. Irregularly irregular S1/S2  Pulmonary/Chest: Effort normal and breath sounds normal. No respiratory distress.  Abdominal: Soft. She exhibits no distension. There is no tenderness.  Musculoskeletal: She exhibits no edema.  Lymphadenopathy:    She has no cervical adenopathy.  Neurological: She is alert and oriented to person, place, and time.  Skin: Skin is warm and dry.  Psychiatric: Her behavior is normal.  anxious          Assessment & Plan:

## 2013-06-03 NOTE — Patient Instructions (Signed)
Schedule your complete physical in 6 months Increase the Sertraline to 1.5 tabs daily (150mg )- script sent to [facility] STOP the pantoprazole- remain upright for ~2 hrs after eating to decrease reflux symptoms.  If you develop heart burn symptoms that last longer than 3 days, restart the medication for 1 week Continue to see your therapist- this is very helpful Continue the Trimethoprim nightly (sent to [facility]) Keep up the good work!  You look great!

## 2013-06-03 NOTE — Assessment & Plan Note (Signed)
Increase Zoloft to 150mg  daily.  Continue counseling.  Will follow.

## 2013-06-03 NOTE — Progress Notes (Signed)
Pre visit review using our clinic review tool, if applicable. No additional management support is needed unless otherwise documented below in the visit note. 

## 2013-06-03 NOTE — Assessment & Plan Note (Signed)
Improved.  Pt would like to attempt trial off PPI.  Reviewed dietary and lifestyle modifications.  Will follow.

## 2013-06-03 NOTE — Assessment & Plan Note (Signed)
Continue trimethoprim nightly.  Discussed D-mannose but there are not enough studies to indicate that this is adequate substitute.

## 2013-06-08 ENCOUNTER — Ambulatory Visit (HOSPITAL_COMMUNITY): Payer: Self-pay | Admitting: Licensed Clinical Social Worker

## 2013-06-08 ENCOUNTER — Ambulatory Visit: Payer: Medicare Other | Admitting: Psychiatry

## 2013-06-14 ENCOUNTER — Ambulatory Visit: Payer: Medicare Other | Admitting: Psychiatry

## 2013-06-15 DIAGNOSIS — Z7901 Long term (current) use of anticoagulants: Secondary | ICD-10-CM | POA: Diagnosis not present

## 2013-06-17 ENCOUNTER — Encounter (HOSPITAL_COMMUNITY): Payer: Self-pay | Admitting: Emergency Medicine

## 2013-06-17 ENCOUNTER — Emergency Department (HOSPITAL_COMMUNITY)
Admission: EM | Admit: 2013-06-17 | Discharge: 2013-06-17 | Disposition: A | Discharge status: Home / Self Care | Payer: Medicare Other | Attending: Emergency Medicine | Admitting: Emergency Medicine

## 2013-06-17 ENCOUNTER — Emergency Department (HOSPITAL_COMMUNITY): Payer: Medicare Other

## 2013-06-17 DIAGNOSIS — Z79899 Other long term (current) drug therapy: Secondary | ICD-10-CM | POA: Diagnosis not present

## 2013-06-17 DIAGNOSIS — Z7901 Long term (current) use of anticoagulants: Secondary | ICD-10-CM | POA: Insufficient documentation

## 2013-06-17 DIAGNOSIS — I4891 Unspecified atrial fibrillation: Secondary | ICD-10-CM | POA: Insufficient documentation

## 2013-06-17 DIAGNOSIS — H538 Other visual disturbances: Secondary | ICD-10-CM | POA: Insufficient documentation

## 2013-06-17 DIAGNOSIS — G20 Parkinson's disease: Secondary | ICD-10-CM | POA: Insufficient documentation

## 2013-06-17 DIAGNOSIS — F329 Major depressive disorder, single episode, unspecified: Secondary | ICD-10-CM | POA: Insufficient documentation

## 2013-06-17 DIAGNOSIS — F411 Generalized anxiety disorder: Secondary | ICD-10-CM | POA: Insufficient documentation

## 2013-06-17 DIAGNOSIS — K219 Gastro-esophageal reflux disease without esophagitis: Secondary | ICD-10-CM | POA: Insufficient documentation

## 2013-06-17 DIAGNOSIS — Z8639 Personal history of other endocrine, nutritional and metabolic disease: Secondary | ICD-10-CM | POA: Diagnosis not present

## 2013-06-17 DIAGNOSIS — G20A1 Parkinson's disease without dyskinesia, without mention of fluctuations: Secondary | ICD-10-CM | POA: Insufficient documentation

## 2013-06-17 DIAGNOSIS — Z8739 Personal history of other diseases of the musculoskeletal system and connective tissue: Secondary | ICD-10-CM | POA: Insufficient documentation

## 2013-06-17 DIAGNOSIS — H539 Unspecified visual disturbance: Secondary | ICD-10-CM | POA: Diagnosis not present

## 2013-06-17 DIAGNOSIS — Q248 Other specified congenital malformations of heart: Secondary | ICD-10-CM | POA: Insufficient documentation

## 2013-06-17 DIAGNOSIS — R51 Headache: Secondary | ICD-10-CM | POA: Diagnosis not present

## 2013-06-17 DIAGNOSIS — F3289 Other specified depressive episodes: Secondary | ICD-10-CM | POA: Insufficient documentation

## 2013-06-17 DIAGNOSIS — I6789 Other cerebrovascular disease: Secondary | ICD-10-CM | POA: Diagnosis not present

## 2013-06-17 DIAGNOSIS — Z860100 Personal history of colon polyps, unspecified: Secondary | ICD-10-CM | POA: Insufficient documentation

## 2013-06-17 DIAGNOSIS — Z862 Personal history of diseases of the blood and blood-forming organs and certain disorders involving the immune mechanism: Secondary | ICD-10-CM | POA: Insufficient documentation

## 2013-06-17 DIAGNOSIS — R42 Dizziness and giddiness: Secondary | ICD-10-CM | POA: Diagnosis not present

## 2013-06-17 DIAGNOSIS — R110 Nausea: Secondary | ICD-10-CM

## 2013-06-17 DIAGNOSIS — Z8619 Personal history of other infectious and parasitic diseases: Secondary | ICD-10-CM | POA: Insufficient documentation

## 2013-06-17 DIAGNOSIS — Z87440 Personal history of urinary (tract) infections: Secondary | ICD-10-CM | POA: Diagnosis not present

## 2013-06-17 DIAGNOSIS — Z87891 Personal history of nicotine dependence: Secondary | ICD-10-CM | POA: Diagnosis not present

## 2013-06-17 LAB — COMPREHENSIVE METABOLIC PANEL
ALBUMIN: 3.5 g/dL (ref 3.5–5.2)
ALT: 10 U/L (ref 0–35)
AST: 18 U/L (ref 0–37)
Alkaline Phosphatase: 77 U/L (ref 39–117)
BILIRUBIN TOTAL: 0.3 mg/dL (ref 0.3–1.2)
BUN: 14 mg/dL (ref 6–23)
CHLORIDE: 101 meq/L (ref 96–112)
CO2: 29 mEq/L (ref 19–32)
CREATININE: 0.82 mg/dL (ref 0.50–1.10)
Calcium: 9.5 mg/dL (ref 8.4–10.5)
GFR calc Af Amer: 75 mL/min — ABNORMAL LOW (ref >90)
GFR calc non Af Amer: 64 mL/min — ABNORMAL LOW (ref >90)
Glucose, Bld: 87 mg/dL (ref 70–99)
Potassium: 4.3 mEq/L (ref 3.7–5.3)
Sodium: 140 mEq/L (ref 137–147)
TOTAL PROTEIN: 6.9 g/dL (ref 6.0–8.3)

## 2013-06-17 LAB — CBC WITH DIFFERENTIAL/PLATELET
BASOS PCT: 0 % (ref 0–1)
Basophils Absolute: 0 10*3/uL (ref 0.0–0.1)
EOS ABS: 0.1 10*3/uL (ref 0.0–0.7)
Eosinophils Relative: 2 % (ref 0–5)
HEMATOCRIT: 37.3 % (ref 36.0–46.0)
HEMOGLOBIN: 12.2 g/dL (ref 12.0–15.0)
Lymphocytes Relative: 19 % (ref 12–46)
Lymphs Abs: 1.2 10*3/uL (ref 0.7–4.0)
MCH: 30.9 pg (ref 26.0–34.0)
MCHC: 32.7 g/dL (ref 30.0–36.0)
MCV: 94.4 fL (ref 78.0–100.0)
MONO ABS: 0.5 10*3/uL (ref 0.1–1.0)
MONOS PCT: 8 % (ref 3–12)
Neutro Abs: 4.8 10*3/uL (ref 1.7–7.7)
Neutrophils Relative %: 71 % (ref 43–77)
Platelets: 206 10*3/uL (ref 150–400)
RBC: 3.95 MIL/uL (ref 3.87–5.11)
RDW: 14 % (ref 11.5–15.5)
WBC: 6.7 10*3/uL (ref 4.0–10.5)

## 2013-06-17 LAB — URINALYSIS, ROUTINE W REFLEX MICROSCOPIC
Bilirubin Urine: NEGATIVE
GLUCOSE, UA: NEGATIVE mg/dL
Hgb urine dipstick: NEGATIVE
Ketones, ur: NEGATIVE mg/dL
NITRITE: NEGATIVE
PROTEIN: NEGATIVE mg/dL
Specific Gravity, Urine: 1.019 (ref 1.005–1.030)
Urobilinogen, UA: 0.2 mg/dL (ref 0.0–1.0)
pH: 7.5 (ref 5.0–8.0)

## 2013-06-17 LAB — PROTIME-INR
INR: 2.06 — ABNORMAL HIGH (ref 0.00–1.49)
Prothrombin Time: 22.6 seconds — ABNORMAL HIGH (ref 11.6–15.2)

## 2013-06-17 LAB — URINE MICROSCOPIC-ADD ON

## 2013-06-17 MED ORDER — MECLIZINE HCL 50 MG PO TABS: 50.0000 mg | ORAL_TABLET | Freq: Three times a day (TID) | ORAL | Status: DC | PRN

## 2013-06-17 MED ORDER — IOHEXOL 350 MG/ML SOLN
50.0000 mL | Freq: Once | INTRAVENOUS | Status: AC | PRN
  Administered 2013-06-17: 50 mL via INTRAVENOUS

## 2013-06-17 MED ORDER — MECLIZINE HCL 25 MG PO TABS
25.0000 mg | ORAL_TABLET | Freq: Once | ORAL | Status: AC
  Administered 2013-06-17: 25 mg via ORAL
  Filled 2013-06-17: qty 1

## 2013-06-17 MED ORDER — ACETAMINOPHEN 500 MG PO TABS
1000.0000 mg | ORAL_TABLET | Freq: Once | ORAL | Status: AC
  Administered 2013-06-17: 1000 mg via ORAL
  Filled 2013-06-17: qty 2

## 2013-06-17 MED ORDER — HYDROCODONE-ACETAMINOPHEN 5-325 MG PO TABS: 2.0000 | ORAL_TABLET | ORAL | Status: DC | PRN

## 2013-06-17 NOTE — ED Notes (Signed)
Ambulated pt to the restroom, pt tolerated it very well.

## 2013-06-17 NOTE — ED Notes (Signed)
Patient leaving to MRI

## 2013-06-17 NOTE — ED Notes (Signed)
Patient reports that she also has a previous diagnosis of parkinson's disease (4 years ago) at [facility]

## 2013-06-17 NOTE — ED Provider Notes (Signed)
I followed up with this patient. I was asked to see her in the reports of her CT angiogram. It is been discussed with her by her providers early this she would go home at this was normal. Her CT angiogram is normal. She is asymptomatic. She is discharged with meclizine and Vicodin. To return if any worsening symptoms.  Tanna Furry, MD 06/17/13 2043

## 2013-06-17 NOTE — ED Notes (Signed)
MD at bedside. 

## 2013-06-17 NOTE — ED Notes (Signed)
Meal given

## 2013-06-17 NOTE — ED Notes (Signed)
[facility] paged to transport pt home.

## 2013-06-17 NOTE — ED Provider Notes (Signed)
Medical screening examination/treatment/procedure(s) were conducted as a shared visit with non-physician practitioner(s) and myself.  I personally evaluated the patient during the encounter.  EKG Interpretation  None  80 yo female with complaint of dizziness, off balance, then vertigo.  Symptoms present when she woke up then worsened through the day.  She was ataxic on PA Harris' exam.  On my exam, well appearing, in bed, nontoxic, no distress, normal finger to nose.  CT head and MR brain are negative for acute abnormality.  Neurology evaluated and recommended CTA.  Plan to obtain this scan and dispo per neuro.    Houston Siren III, MD 06/17/13 [phone]

## 2013-06-17 NOTE — ED Provider Notes (Signed)
CSN: [id number]     Arrival date & time 06/17/13  5361 History   First MD Initiated Contact with Patient 06/17/13 [id number]     Chief Complaint  Patient presents with  . Headache     (Consider location/radiation/quality/duration/timing/severity/associated sxs/prior Treatment) HPI  This is an 80 year old female who presents to the emergency department with chief complaint of headache.  The patient states that she awoke around 6:45 in the morning with a severe headache, dizziness.  She states that her headache began in the back of her head and radiated frontally bilaterally.  She also noticed that she had some blurred vision.  Patient states that when she stood up to walk she felt very off balance and sat back down.  Patient states that her headache is constant, aching.  It has eased off and is now at a 3/10 however when she woke this morning it was the worst headache she had ever had. Patient states that she got up to go to feed her cat.  Patient states he had to hold onto various objects in order to keep her balance. By the time she got to see her cats she began having symptoms of vertigo and nausea.  Patient states that she sat down and her symptoms resolved after about 15 minutes. Patient states that she also had some word finding difficulty when being interviewed by the EMS.  She states she has no previous history of headaches.  She states that she was diagnosed with Parkinson's but has been "in denial" about this diagnosis and is on no medications.  She does have a constant tremor.  Past Medical History  Diagnosis Date  . Arthritis   . Hyperlipidemia   . 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 Chronic constipation   . Hemorrhoid   . Varicose veins   . Anxiety   . Depression   . GERD (gastroesophageal reflux disease)   . Atrial fibrillation   . Recurrent UTI   . Parkinson disease   . Cardiac arrhythmia due to congenital heart disease   . Mycotic toenails 10/27/2012   Past Surgical  History  Procedure Laterality Date  . Appendectomy    . Ileal resection  03/2007    Due to obstruction   Family History  Problem Relation Age of Onset  . Asthma Brother   . Alcohol abuse Brother   . Throat cancer Father   . Alcohol abuse Father   . Lupus Daughter   . Bipolar disorder Daughter   . Lupus Daughter   . Anxiety disorder Daughter   . Alcohol abuse Sister   . Alcohol abuse Maternal Grandfather   . Alcohol abuse Paternal Grandmother    History  Substance Use Topics  . Smoking status: Former Smoker -- 2.00 packs/day for 30 years    Types: Cigarettes    Quit date: 04/21/1980  . Smokeless tobacco: Never Used     Comment: onset age 89 -6, up to > 1ppd (almost 2 ppd)  . Alcohol Use: No   OB History   Grav Para Term Preterm Abortions TAB SAB Ect Mult Living                 Review of Systems  Ten systems reviewed and are negative for acute change, except as noted in the HPI.    Allergies  Adhesive; Amoxicillin; Ciprofloxacin; and Ultram  Home Medications   Current Outpatient Rx  Name  Route  Sig  Dispense  Refill  . albuterol (VENTOLIN HFA)  108 (90 BASE) MCG/ACT inhaler   Inhalation   Inhale 2 puffs into the lungs 4 (four) times daily as needed. For shortness of breath         . Calcium Carbonate-Vitamin D (CALCIUM 600+D) 600-400 MG-UNIT per tablet   Oral   Take 1 tablet by mouth 2 (two) times daily.           . Cranberry-Vitamin C-Vitamin E (CRANBERRY SUPER STRENGTH) 6000-100-3 MG-MG-UNIT CAPS   Oral   Take 1 capsule by mouth every morning.           . diltiazem (CARDIZEM) 30 MG tablet   Oral   Take 30 mg by mouth daily as needed (for A-fib).          . fish oil-omega-3 fatty acids 1000 MG capsule   Oral   Take 1 g by mouth every morning.          . Hypromellose (ARTIFICIAL TEARS OP)   Ophthalmic   Apply 1 drop to eye 2 (two) times daily as needed (dry eye).         . propafenone (RYTHMOL) 150 MG tablet   Oral   Take 150 mg by  mouth every 8 (eight) hours as needed (for arrhythmia).          . sertraline (ZOLOFT) 100 MG tablet      1.5 tabs daily (150mg  daily)   135 tablet   3   . trimethoprim (TRIMPEX) 100 MG tablet   Oral   Take 100 mg by mouth every evening.         . warfarin (COUMADIN) 2.5 MG tablet   Oral   Take 2.5-5 mg by mouth at bedtime. Take 2.5mg  by mouth on Tues and Fri. Take 5mg  by mouth all other days          BP 122/71  Pulse 64  Temp(Src) 97.9 F (36.6 C) (Oral)  Resp 20  Ht 5\' 10"  (1.778 m)  Wt 160 lb (72.576 kg)  BMI 22.96 kg/m2  SpO2 98% Physical Exam  Constitutional: She is oriented to person, place, and time. She appears well-developed and well-nourished. No distress.  HENT:  Head: Normocephalic and atraumatic.  Eyes: Conjunctivae are normal. No scleral icterus.  Neck: Normal range of motion.  Cardiovascular: Normal rate, regular rhythm and normal heart sounds.  Exam reveals no gallop and no friction rub.   No murmur heard. Pulmonary/Chest: Effort normal and breath sounds normal. No respiratory distress.  Abdominal: Soft. Bowel sounds are normal. She exhibits no distension and no mass. There is no tenderness. There is no guarding.  Neurological: She is alert and oriented to person, place, and time.  Resting tremor with pill rolling. On neurologic exam the patient has nystatin mass that is lateral when eyes are deviated to the left.  Patient also has what appears to be vertical nystagmus when I sit center.  Peripheral fields showed deficit in the superior lateral field of the patient's right eye.  The patient has left sided cerebellar alert deficit on finger to nose and rapid finger movement.  There is slight weakness in the right hip flexor.  DTRs are normal, sensation is intact.  Patient with gait abnormality.  Skin: Skin is warm and dry. She is not diaphoretic.    ED Course  Procedures (including critical care time) Labs Review Labs Reviewed  PROTIME-INR -  Abnormal; Notable for the following:    Prothrombin Time 22.6 (*)    INR 2.06 (*)  All other components within normal limits  COMPREHENSIVE METABOLIC PANEL - Abnormal; Notable for the following:    GFR calc non Af Amer 64 (*)    GFR calc Af Amer 75 (*)    All other components within normal limits  URINALYSIS, ROUTINE W REFLEX MICROSCOPIC - Abnormal; Notable for the following:    Leukocytes, UA MODERATE (*)    All other components within normal limits  URINE MICROSCOPIC-ADD ON - Abnormal; Notable for the following:    Squamous Epithelial / LPF FEW (*)    Bacteria, UA FEW (*)    All other components within normal limits  CBC WITH DIFFERENTIAL   Imaging Review Ct Head Wo Contrast  06/17/2013   CLINICAL DATA:  80 year old female with headache. Initial encounter.  EXAM: CT HEAD WITHOUT CONTRAST  TECHNIQUE: Contiguous axial images were obtained from the base of the skull through the vertex without intravenous contrast.  COMPARISON:  03/18/2010.  FINDINGS: Visualized paranasal sinuses and mastoids are clear. Stable visualized osseous structures. Stable orbit and scalp soft tissues.  Calcified atherosclerosis at the skull base. 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 Stable cerebral volume. No ventriculomegaly. Chronic Patchy and confluent cerebral white matter hypodensity. No midline shift, mass effect, or evidence of intracranial mass lesion. No acute intracranial hemorrhage identified. No suspicious intracranial vascular hyperdensity. No evidence of cortically based acute infarction identified.  IMPRESSION: No acute intracranial abnormality. Chronic nonspecific white matter changes.   Electronically Signed   By: Lars Pinks M.D.   On: 06/17/2013 09:54     EKG Interpretation None      MDM   Final diagnoses:  None    Patient with vague disequilibrium and fleeting vertiginous sxs. No hx of BPPV or meniere's/ On coumadin with therapeutic INR,  Unknown time of sxs  onset considering patient awoke with the sxs. No code stroke was called due to those facts. Patient CT negative will advance to MRI>  Patient MRI negative. I have asked Dr. Aram Beecham to consult and he recommends CTA brain.  I will order the imaging.   Patient frustrated due to long waint time. I spoke with her concerning Neurology recommendations. Patient is worried about getting home late and having a ride. She is still having difficulty with walking and there is ice on the ground and steps to her home. She feels she would be safe getting home before dark. Patient has agreed to stay if she may eat, I have spoken with Charge Nurse Babs Bertin and patient will go home by [facility] if discharged.  Patient CTA is still pending.  Dr. Jeneen Rinks will assume care.     Margarita Mail, PA-C 06/19/13 2125

## 2013-06-17 NOTE — ED Notes (Signed)
Woke up 0644 sudden onset severe headache blurred vision, headache is in frontal area.  No loc, no recent etoh use, no breakfast this am, last cbg 123.   EMS reports negative for stroke scale, alert and oriented, following commands, GCS15. VS:  112/62, 62 irregular (hx of afib, pt is on blood thinners.) pt reports nausea and dizzy this am when she was standing up.

## 2013-06-17 NOTE — Consult Note (Signed)
NEURO HOSPITALIST CONSULT NOTE    Reason for Consult: HA, vertigo, nausea, imbalance, blurred vision.  HPI:                                                                                                                                          [name] is an 80 y.o. female with a past medical history significant for hyperlipidemia, atria fibrillation on coumadin, PD, depression, anxiety, brought in by ambulance due to the above stated symptoms. She said that she woke up at 645 this morning and as soon as she trued to get out of bed she developed a severe HA followed by nausea and blurred vision. She fell back on her bed and then had transient vertigo and unsteadiness. Denies speech changes, focal weakness or numbness, confusion, difficulty swallowing, chest pain, or palpitations. Had CT and MRI brain here in the ED that showed no acute abnormality. INR is therapeutic. Presently, she complains only of a mild frontal HA.  Past Medical History  Diagnosis Date  . Arthritis   . Hyperlipidemia   . 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 Chronic constipation   . Hemorrhoid   . Varicose veins   . Anxiety   . Depression   . GERD (gastroesophageal reflux disease)   . Atrial fibrillation   . Recurrent UTI   . Parkinson disease   . Cardiac arrhythmia due to congenital heart disease   . Mycotic toenails 10/27/2012    Past Surgical History  Procedure Laterality Date  . Appendectomy    . Ileal resection  03/2007    Due to obstruction    Family History  Problem Relation Age of Onset  . Asthma Brother   . Alcohol abuse Brother   . Throat cancer Father   . Alcohol abuse Father   . Lupus Daughter   . Bipolar disorder Daughter   . Lupus Daughter   . Anxiety disorder Daughter   . Alcohol abuse Sister   . Alcohol abuse Maternal Grandfather   . Alcohol abuse Paternal Grandmother     Social History:  reports that she quit smoking about 33 years ago. Her  smoking use included Cigarettes. She has a 60 pack-year smoking history. She has never used smokeless tobacco. She reports that she does not drink alcohol or use illicit drugs.  Allergies  Allergen Reactions  . Adhesive [Tape] Other (See Comments)    Reaction :  blisters  . Amoxicillin Other (See Comments)    Reaction : thrush  . Ciprofloxacin Other (See Comments)    Reaction : thrush  . Ultram [Tramadol]     hypotension    MEDICATIONS:  I have reviewed the patient's current medications.   ROS:                                                                                                                                       History obtained from the patient and chart review.  General ROS: negative for - chills, fatigue, fever, night sweats, weight gain or weight loss Psychological ROS: negative for - behavioral disorder, hallucinations, memory difficulties, mood swings or suicidal ideation Ophthalmic ROS: negative for - blurry vision, double vision, eye pain or loss of vision ENT ROS: negative for - epistaxis, nasal discharge, oral lesions, sore throat, tinnitus or vertigo Allergy and Immunology ROS: negative for - hives or itchy/watery eyes Hematological and Lymphatic ROS: negative for - bleeding problems, bruising or swollen lymph nodes Endocrine ROS: negative for - galactorrhea, hair pattern changes, polydipsia/polyuria or temperature intolerance Respiratory ROS: negative for - cough, hemoptysis, shortness of breath or wheezing Cardiovascular ROS: negative for - chest pain, dyspnea on exertion, edema or irregular heartbeat Gastrointestinal ROS: negative for - abdominal pain, diarrhea, hematemesis, nausea/vomiting or stool incontinence Genito-Urinary ROS: negative for - dysuria, hematuria, incontinence or urinary frequency/urgency Musculoskeletal ROS: negative for -  joint swelling or muscular weakness Neurological ROS: as noted in HPI Dermatological ROS: negative for rash and skin lesion changes   Physical exam: pleasant female in no apparent distress. Blood pressure 134/77, pulse 67, temperature 98.2 F (36.8 C), temperature source Oral, resp. rate 22, height 5' 10" (1.778 m), weight 72.576 kg (160 lb), SpO2 98.00%. Head: normocephalic. Neck: supple, no bruits, no JVD. Cardiac: no murmurs. Lungs: clear. Abdomen: soft, no tender, no mass. Extremities: no edema.  Neurologic Examination:                                                                                                      Mental Status: Alert, oriented, thought content appropriate.  Speech fluent without evidence of aphasia.  Able to follow 3 step commands without difficulty. Cranial Nerves: II: Discs flat bilaterally; Visual fields grossly normal, pupils equal, round, reactive to light and accommodation III,IV, VI: ptosis not present, extra-ocular motions intact bilaterally V,VII: smile symmetric, facial light touch sensation normal bilaterally VIII: hearing normal bilaterally IX,X: gag reflex present XI: bilateral shoulder shrug XII: midline tongue extension without atrophy or fasciculations  Motor: Right : Upper extremity   5/5    Left:     Upper extremity   5/5  Lower extremity   5/5  Lower extremity   5/5 Tone and bulk:normal tone throughout; no atrophy noted Sensory: Pinprick and light touch intact throughout, bilaterally Deep Tendon Reflexes:  Right: Upper Extremity   Left: Upper extremity   biceps (C-5 to C-6) 2/4   biceps (C-5 to C-6) 2/4 tricep (C7) 2/4    triceps (C7) 2/4 Brachioradialis (C6) 2/4  Brachioradialis (C6) 2/4  Lower Extremity Lower Extremity  quadriceps (L-2 to L-4) 2/4   quadriceps (L-2 to L-4) 2/4 Achilles (S1) 2/4   Achilles (S1) 2/4  Plantars: Right: downgoing   Left: downgoing Cerebellar: normal finger-to-nose,  normal heel-to-shin  test Gait:  No ataxia. CV: pulses palpable throughout    Lab Results  Component Value Date/Time   CHOL 180 12/01/2012  9:19 AM    Results for orders placed during the hospital encounter of 06/17/13 (from the past 48 hour(s))  PROTIME-INR     Status: Abnormal   Collection Time    06/17/13  9:24 AM      Result Value Ref Range   Prothrombin Time 22.6 (*) 11.6 - 15.2 seconds   INR 2.06 (*) 0.00 - 1.49  CBC WITH DIFFERENTIAL     Status: None   Collection Time    06/17/13  9:24 AM      Result Value Ref Range   WBC 6.7  4.0 - 10.5 K/uL   RBC 3.95  3.87 - 5.11 MIL/uL   Hemoglobin 12.2  12.0 - 15.0 g/dL   HCT 37.3  36.0 - 46.0 %   MCV 94.4  78.0 - 100.0 fL   MCH 30.9  26.0 - 34.0 pg   MCHC 32.7  30.0 - 36.0 g/dL   RDW 14.0  11.5 - 15.5 %   Platelets 206  150 - 400 K/uL   Neutrophils Relative % 71  43 - 77 %   Neutro Abs 4.8  1.7 - 7.7 K/uL   Lymphocytes Relative 19  12 - 46 %   Lymphs Abs 1.2  0.7 - 4.0 K/uL   Monocytes Relative 8  3 - 12 %   Monocytes Absolute 0.5  0.1 - 1.0 K/uL   Eosinophils Relative 2  0 - 5 %   Eosinophils Absolute 0.1  0.0 - 0.7 K/uL   Basophils Relative 0  0 - 1 %   Basophils Absolute 0.0  0.0 - 0.1 K/uL  COMPREHENSIVE METABOLIC PANEL     Status: Abnormal   Collection Time    06/17/13  9:24 AM      Result Value Ref Range   Sodium 140  137 - 147 mEq/L   Potassium 4.3  3.7 - 5.3 mEq/L   Chloride 101  96 - 112 mEq/L   CO2 29  19 - 32 mEq/L   Glucose, Bld 87  70 - 99 mg/dL   BUN 14  6 - 23 mg/dL   Creatinine, Ser 0.82  0.50 - 1.10 mg/dL   Calcium 9.5  8.4 - 10.5 mg/dL   Total Protein 6.9  6.0 - 8.3 g/dL   Albumin 3.5  3.5 - 5.2 g/dL   AST 18  0 - 37 U/L   ALT 10  0 - 35 U/L   Alkaline Phosphatase 77  39 - 117 U/L   Total Bilirubin 0.3  0.3 - 1.2 mg/dL   GFR calc non Af Amer 64 (*) >90 mL/min   GFR calc Af Amer 75 (*) >90 mL/min   Comment: (NOTE)     The eGFR has been  calculated using the CKD EPI equation.     This calculation has not been  validated in all clinical situations.     eGFR's persistently <90 mL/min signify possible Chronic Kidney     Disease.  URINALYSIS, ROUTINE W REFLEX MICROSCOPIC     Status: Abnormal   Collection Time    06/17/13 10:44 AM      Result Value Ref Range   Color, Urine YELLOW  YELLOW   APPearance CLEAR  CLEAR   Specific Gravity, Urine 1.019  1.005 - 1.030   pH 7.5  5.0 - 8.0   Glucose, UA NEGATIVE  NEGATIVE mg/dL   Hgb urine dipstick NEGATIVE  NEGATIVE   Bilirubin Urine NEGATIVE  NEGATIVE   Ketones, ur NEGATIVE  NEGATIVE mg/dL   Protein, ur NEGATIVE  NEGATIVE mg/dL   Urobilinogen, UA 0.2  0.0 - 1.0 mg/dL   Nitrite NEGATIVE  NEGATIVE   Leukocytes, UA MODERATE (*) NEGATIVE  URINE MICROSCOPIC-ADD ON     Status: Abnormal   Collection Time    06/17/13 10:44 AM      Result Value Ref Range   Squamous Epithelial / LPF FEW (*) RARE   WBC, UA 3-6  <3 WBC/hpf   RBC / HPF 0-2  <3 RBC/hpf   Bacteria, UA FEW (*) RARE    Ct Head Wo Contrast  06/17/2013   CLINICAL DATA:  80 year old female with headache. Initial encounter.  EXAM: CT HEAD WITHOUT CONTRAST  TECHNIQUE: Contiguous axial images were obtained from the base of the skull through the vertex without intravenous contrast.  COMPARISON:  03/18/2010.  FINDINGS: Visualized paranasal sinuses and mastoids are clear. Stable visualized osseous structures. Stable orbit and scalp soft tissues.  Calcified atherosclerosis at the skull base. 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 Stable cerebral volume. No ventriculomegaly. Chronic Patchy and confluent cerebral white matter hypodensity. No midline shift, mass effect, or evidence of intracranial mass lesion. No acute intracranial hemorrhage identified. No suspicious intracranial vascular hyperdensity. No evidence of cortically based acute infarction identified.  IMPRESSION: No acute intracranial abnormality. Chronic nonspecific white matter changes.   Electronically Signed   By: Lars Pinks  M.D.   On: 06/17/2013 09:54   Mr Brain Wo Contrast  06/17/2013   CLINICAL DATA:  Confusion and headache.  Parkinson's disease.  EXAM: MRI HEAD WITHOUT CONTRAST  TECHNIQUE: Multiplanar, multiecho pulse sequences of the brain and surrounding structures were obtained without intravenous contrast.  COMPARISON:  CT head without contrast 06/17/2013. MRI brain without contrast 09/07/2006.  FINDINGS: Moderate generalized atrophy and diffuse white matter disease is again seen. No acute infarct, hemorrhage, or mass lesion is present. The patient is status post bilateral lens extractions. The paranasal sinuses are clear. There is some fluid in the left mastoid air cells. No obstructing nasopharyngeal lesion is evident. And the  IMPRESSION: 1. Age advanced atrophy and extensive white matter disease with significant progression since 2008. 2. No acute intracranial abnormality. 3. Left mastoid effusion. No obstructing nasopharyngeal lesion is evident.   Electronically Signed   By: Lawrence Santiago M.D.   On: 06/17/2013 13:44   Assessment/Plan: 80 y/o with acute onset HA, vertigo, nausea, unsteadiness, and unremarkable CT/MRI brain. Posterior circulation TIA?Marland Kitchen Recommend: CTA brain and neck. Can discharge home if tests are negative.  Dorian Pod, MD 06/17/2013, 3:55 PM

## 2013-06-17 NOTE — Discharge Instructions (Signed)
Take meclizine until 24 hours without symptoms. Recheck here with worsening symptoms.  Benign Positional Vertigo Vertigo means you feel like you or your surroundings are moving when they are not. Benign positional vertigo is the most common form of vertigo. Benign means that the cause of your condition is not serious. Benign positional vertigo is more common in older adults. CAUSES  Benign positional vertigo is the result of an upset in the labyrinth system. This is an area in the middle ear that helps control your balance. This may be caused by a viral infection, head injury, or repetitive motion. However, often no specific cause is found. SYMPTOMS  Symptoms of benign positional vertigo occur when you move your head or eyes in different directions. Some of the symptoms may include:  Loss of balance and falls.  Vomiting.  Blurred vision.  Dizziness.  Nausea.  Involuntary eye movements (nystagmus). DIAGNOSIS  Benign positional vertigo is usually diagnosed by physical exam. If the specific cause of your benign positional vertigo is unknown, your caregiver may perform imaging tests, such as magnetic resonance imaging (MRI) or computed tomography (CT). TREATMENT  Your caregiver may recommend movements or procedures to correct the benign positional vertigo. Medicines such as meclizine, benzodiazepines, and medicines for nausea may be used to treat your symptoms. In rare cases, if your symptoms are caused by certain conditions that affect the inner ear, you may need surgery. HOME CARE INSTRUCTIONS   Follow your caregiver's instructions.  Move slowly. Do not make sudden body or head movements.  Avoid driving.  Avoid operating heavy machinery.  Avoid performing any tasks that would be dangerous to you or others during a vertigo episode.  Drink enough fluids to keep your urine clear or pale yellow. SEEK IMMEDIATE MEDICAL CARE IF:   You develop problems with walking, weakness, numbness,  or using your arms, hands, or legs.  You have difficulty speaking.  You develop severe headaches.  Your nausea or vomiting continues or gets worse.  You develop visual changes.  Your family or friends notice any behavioral changes.  Your condition gets worse.  You have a fever.  You develop a stiff neck or sensitivity to light. MAKE SURE YOU:   Understand these instructions.  Will watch your condition.  Will get help right away if you are not doing well or get worse. Document Released: 01/13/2006 Document Revised: 06/30/2011 Document Reviewed: 12/26/2010 [facility] Patient Information 2014 [place].

## 2013-06-17 NOTE — ED Notes (Signed)
Neurology at bedside.

## 2013-06-21 NOTE — ED Provider Notes (Signed)
Medical screening examination/treatment/procedure(s) were conducted as a shared visit with non-physician practitioner(s) and myself.  I personally evaluated the patient during the encounter.   EKG Interpretation None        Houston Siren III, MD 06/21/13 0110

## 2013-06-22 ENCOUNTER — Ambulatory Visit: Payer: Medicare Other | Admitting: Psychiatry

## 2013-06-22 ENCOUNTER — Ambulatory Visit (INDEPENDENT_AMBULATORY_CARE_PROVIDER_SITE_OTHER): Payer: Medicare Other | Admitting: Psychiatry

## 2013-06-22 DIAGNOSIS — F411 Generalized anxiety disorder: Secondary | ICD-10-CM | POA: Diagnosis not present

## 2013-06-29 ENCOUNTER — Ambulatory Visit (HOSPITAL_COMMUNITY): Payer: Self-pay | Admitting: Licensed Clinical Social Worker

## 2013-06-30 ENCOUNTER — Ambulatory Visit: Payer: Medicare Other | Admitting: Psychiatry

## 2013-07-06 ENCOUNTER — Ambulatory Visit (INDEPENDENT_AMBULATORY_CARE_PROVIDER_SITE_OTHER): Payer: Medicare Other | Admitting: Pulmonary Disease

## 2013-07-06 ENCOUNTER — Encounter: Payer: Self-pay | Admitting: Pulmonary Disease

## 2013-07-06 VITALS — BP 102/62 | HR 64 | Temp 97.5°F | Ht 70.0 in | Wt 164.4 lb

## 2013-07-06 DIAGNOSIS — J449 Chronic obstructive pulmonary disease, unspecified: Secondary | ICD-10-CM | POA: Diagnosis not present

## 2013-07-06 DIAGNOSIS — J4489 Other specified chronic obstructive pulmonary disease: Secondary | ICD-10-CM

## 2013-07-06 NOTE — Patient Instructions (Signed)
Continue with your exercise program. Can take zyrtec 10mg  OTC at bedtime each night for your postnasal drip Continue to use albuterol if needed, but let us know if you require more than 2-3 times a week. followup with me again in one year, but if doing well, will see as needed after that.

## 2013-07-06 NOTE — Progress Notes (Signed)
   Subjective:    Patient ID: [name], female    DOB: 1927/06/22, 80 y.o.   MRN: [id number]  HPI Patient comes in today for followup of her known mild COPD. She has been doing very well from a breathing standpoint since being on a consistent exercise program. She has had no chest congestion or purulent mucus, and rarely uses her rescue inhaler. She is having some postnasal drip overnight which results in the morning cough and audible wheezing from the upper airway.   Review of Systems  Constitutional: Negative for fever and unexpected weight change.  HENT: Negative for congestion, dental problem, ear pain, nosebleeds, postnasal drip, rhinorrhea, sinus pressure, sneezing, sore throat and trouble swallowing.   Eyes: Negative for redness and itching.  Respiratory: Positive for cough, shortness of breath and wheezing. Negative for chest tightness.   Cardiovascular: Negative for palpitations and leg swelling.  Gastrointestinal: Negative for nausea and vomiting.  Genitourinary: Negative for dysuria.  Musculoskeletal: Negative for joint swelling.  Skin: Negative for rash.  Neurological: Negative for headaches.  Hematological: Does not bruise/bleed easily.  Psychiatric/Behavioral: Negative for dysphoric mood. The patient is not nervous/anxious.        Objective:   Physical Exam Well-developed female in no acute distress Nose without purulence or discharge noted Neck without lymphadenopathy or thyromegaly Chest with completely clear breath sounds, although she did have upper airway noise transmitted to the bases prior to clearing her throat. Her lungs were clear when she did this. Cardiac exam with regular rate and rhythm Lower extremities with minimal ankle edema, no cyanosis Alert and oriented, moves all 4 extremities.       Assessment & Plan:

## 2013-07-06 NOTE — Assessment & Plan Note (Signed)
The patient seems to be doing very well from a COPD standpoint, especially since she has been on a consistent exercise program.  She rarely uses her rescue inhaler, and is satisfied with her exertional tolerance. She is continuing to have some postnasal drip that results in congestion in her throat and coughing in the morning when she arises, and I would like for her to try an over-the-counter antihistamine for this.

## 2013-07-07 ENCOUNTER — Ambulatory Visit (INDEPENDENT_AMBULATORY_CARE_PROVIDER_SITE_OTHER): Payer: Medicare Other | Admitting: Psychiatry

## 2013-07-07 DIAGNOSIS — F329 Major depressive disorder, single episode, unspecified: Secondary | ICD-10-CM

## 2013-07-07 DIAGNOSIS — F3289 Other specified depressive episodes: Secondary | ICD-10-CM | POA: Diagnosis not present

## 2013-07-13 DIAGNOSIS — Z7901 Long term (current) use of anticoagulants: Secondary | ICD-10-CM | POA: Diagnosis not present

## 2013-07-16 ENCOUNTER — Ambulatory Visit (INDEPENDENT_AMBULATORY_CARE_PROVIDER_SITE_OTHER): Payer: Medicare Other | Admitting: Family Medicine

## 2013-07-16 VITALS — BP 124/76 | HR 78 | Temp 98.4°F | Resp 17 | Ht 70.5 in | Wt 159.0 lb

## 2013-07-16 DIAGNOSIS — R300 Dysuria: Secondary | ICD-10-CM

## 2013-07-16 DIAGNOSIS — N390 Urinary tract infection, site not specified: Secondary | ICD-10-CM | POA: Diagnosis not present

## 2013-07-16 LAB — POCT URINALYSIS DIPSTICK
Bilirubin, UA: NEGATIVE
Blood, UA: NEGATIVE
Glucose, UA: NEGATIVE
Ketones, UA: NEGATIVE
Nitrite, UA: NEGATIVE
Protein, UA: NEGATIVE
Spec Grav, UA: 1.01
Urobilinogen, UA: 0.2
pH, UA: 6

## 2013-07-16 LAB — POCT UA - MICROSCOPIC ONLY
Casts, Ur, LPF, POC: NEGATIVE
Crystals, Ur, HPF, POC: NEGATIVE
Mucus, UA: NEGATIVE
Yeast, UA: NEGATIVE

## 2013-07-16 MED ORDER — CEPHALEXIN 500 MG PO CAPS: 500.0000 mg | ORAL_CAPSULE | Freq: Four times a day (QID) | ORAL | Status: DC

## 2013-07-16 NOTE — Progress Notes (Signed)
[name] is a 80 y.o. female who presents to [facility] today with complaints concerning for UTI:  1.  Concern for UTI: [name] is a 80 y.o. female who complains of urinary frequency, urgency and dysuria x 4-5 days.  She denies any flank or back pain, fever, chills, vomiting, or abnormal vaginal discharge/itching/bleeding.  Mild nausea.  Does endorse some suprapubic pressure.  No history of incontinence.    On Trimethoprim for prophylaxis.  Still taking this.  Interested in trying probiotics.   The following portions of the patient's history were reviewed and updated as appropriate: allergies, current medications, past medical history, family and social history, and problem list.  Patient is a nonsmoker.    Past Medical History  Diagnosis Date  . Arthritis   . Hyperlipidemia   . 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 Chronic constipation   . Hemorrhoid   . Varicose veins   . Anxiety   . Depression   . GERD (gastroesophageal reflux disease)   . Atrial fibrillation   . Recurrent UTI   . Parkinson disease   . Cardiac arrhythmia due to congenital heart disease   . Mycotic toenails 10/27/2012   Past Surgical History  Procedure Laterality Date  . Appendectomy    . Ileal resection  03/2007    Due to obstruction    Medications reviewed. Current Outpatient Prescriptions  Medication Sig Dispense Refill  . albuterol (VENTOLIN HFA) 108 (90 BASE) MCG/ACT inhaler Inhale 2 puffs into the lungs 4 (four) times daily as needed. For shortness of breath      . Calcium Carbonate-Vitamin D (CALCIUM 600+D) 600-400 MG-UNIT per tablet Take 1 tablet by mouth 2 (two) times daily.        . Cranberry-Vitamin C-Vitamin E (CRANBERRY SUPER STRENGTH) 6000-100-3 MG-MG-UNIT CAPS Take 1 capsule by mouth every morning.        . fish oil-omega-3 fatty acids 1000 MG capsule Take 1 g by mouth every morning.       . Hypromellose (ARTIFICIAL TEARS OP) Apply 1 drop to eye 2 (two) times daily as needed  (dry eye).      . meclizine (ANTIVERT) 50 MG tablet Take 1 tablet (50 mg total) by mouth 3 (three) times daily as needed.  30 tablet  0  . sertraline (ZOLOFT) 100 MG tablet 1.5 tabs daily (150mg  daily)  135 tablet  3  . trimethoprim (TRIMPEX) 100 MG tablet Take 100 mg by mouth every evening.      . warfarin (COUMADIN) 2.5 MG tablet Take 2.5-5 mg by mouth at bedtime. Take 2.5mg  by mouth on Tues and Fri. Take 5mg  by mouth all other days       No current facility-administered medications for this visit.    ROS as above otherwise neg.      Objective:   Physical Exam BP 124/76  Pulse 78  Temp(Src) 98.4 F (36.9 C) (Oral)  Resp 17  Ht 5' 10.5" (1.791 m)  Wt 159 lb (72.122 kg)  BMI 22.48 kg/m2  SpO2 97% Gen:  Alert, cooperative patient who appears stated age in no acute distress.  Vital signs reviewed. Mouth: MMM Cardiac:  Regular rate and rhythm without murmur auscultated.  Good S1/S2. Pulm:  Clear to auscultation bilaterally with good air movement.  No wheezes or rales noted.   Abdomen:  Soft/ND/NT.  No CVA tenderness bilaterally   Results for orders placed in visit on 07/16/13 (from the past 72 hour(s))  POCT UA - MICROSCOPIC  ONLY     Status: None   Collection Time    07/16/13 11:05 AM      Result Value Ref Range   WBC, Ur, HPF, POC 1-3     RBC, urine, microscopic 1-2     Bacteria, U Microscopic trace     Mucus, UA neg     Epithelial cells, urine per micros 1-2     Crystals, Ur, HPF, POC neg     Casts, Ur, LPF, POC neg     Yeast, UA neg    POCT URINALYSIS DIPSTICK     Status: None   Collection Time    07/16/13 11:05 AM      Result Value Ref Range   Color, UA yellow     Clarity, UA clear     Glucose, UA neg     Bilirubin, UA neg     Ketones, UA neg     Spec Grav, UA 1.010     Blood, UA neg     pH, UA 6.0     Protein, UA neg     Urobilinogen, UA 0.2     Nitrite, UA neg     Leukocytes, UA Trace      Assessment and Plan:  1.  UTI: - diagnosis based on symptoms  more so than UA, which has trace leukocytes - not diagnosed with UTI back in ED with HA end of February, though urine did show what appeared to have been UTI - now with some mild nausea.  No fevers or chills, no other systemic symptoms.   - Treat with Keflex, she has had this medication before with good results.  Amoxicillin creates side effects of thrush, NOT true allergy to this medicine - send for culture.  - probiotic trial.  FU with PCP in about 2 weeks to check if need for continued PPX.

## 2013-07-16 NOTE — Patient Instructions (Addendum)
It looks like you have a urinary tract infection.  I am sending in an antibiotic to clear this up.  If you would like to pick probiotics, the refrigerated kind is better and it should contain Lactobacillus.  Yogurt is fine as well.    Go back to see your primary care doctor in about 2 weeks to make sure this is cleared up.  It was good to meet you today.

## 2013-07-21 ENCOUNTER — Ambulatory Visit: Payer: Medicare Other | Admitting: Psychiatry

## 2013-07-21 ENCOUNTER — Ambulatory Visit (INDEPENDENT_AMBULATORY_CARE_PROVIDER_SITE_OTHER): Payer: Medicare Other | Admitting: Psychiatry

## 2013-07-21 DIAGNOSIS — F411 Generalized anxiety disorder: Secondary | ICD-10-CM

## 2013-07-27 ENCOUNTER — Ambulatory Visit (INDEPENDENT_AMBULATORY_CARE_PROVIDER_SITE_OTHER): Payer: Medicare Other | Admitting: Podiatrist

## 2013-07-27 ENCOUNTER — Encounter: Payer: Self-pay | Admitting: Podiatrist

## 2013-07-27 VITALS — BP 129/60 | HR 77 | Resp 12

## 2013-07-27 DIAGNOSIS — M79609 Pain in unspecified limb: Secondary | ICD-10-CM | POA: Diagnosis not present

## 2013-07-27 DIAGNOSIS — B351 Tinea unguium: Secondary | ICD-10-CM | POA: Diagnosis not present

## 2013-07-27 DIAGNOSIS — Z7901 Long term (current) use of anticoagulants: Secondary | ICD-10-CM | POA: Diagnosis not present

## 2013-07-27 NOTE — Progress Notes (Signed)
HPI:  Patient presents today for follow up of foot and nail care. Denies any new complaints today. She has a cardiology appointment today and states her INR is off.  Otherwise she is doing well  Objective:  Patients chart is reviewed.  Vascular status reveals pedal pulses noted at 2 out of 4 dp and pt bilateral .  Neurological sensation is Normal to [employer] monofilament bilateral.  Patients nails are thickened, discolored, distrophic, friable and brittle with yellow-brown discoloration. Patient subjectively relates they are painful with shoes and with ambulation of bilateral feet.  Assessment:  Symptomatic onychomycosis  Plan:  Discussed treatment options and alternatives.  The symptomatic toenails were debrided through manual an mechanical means without complication.  Return appointment recommended at routine intervals of 3 months    Trudie Buckler, DPM

## 2013-07-29 ENCOUNTER — Ambulatory Visit (INDEPENDENT_AMBULATORY_CARE_PROVIDER_SITE_OTHER): Payer: Medicare Other | Admitting: Family Medicine

## 2013-07-29 ENCOUNTER — Encounter: Payer: Self-pay | Admitting: Family Medicine

## 2013-07-29 VITALS — BP 122/64 | HR 74 | Temp 98.4°F | Resp 16 | Wt 163.2 lb

## 2013-07-29 DIAGNOSIS — N760 Acute vaginitis: Secondary | ICD-10-CM | POA: Insufficient documentation

## 2013-07-29 DIAGNOSIS — R82998 Other abnormal findings in urine: Secondary | ICD-10-CM

## 2013-07-29 DIAGNOSIS — N390 Urinary tract infection, site not specified: Secondary | ICD-10-CM | POA: Diagnosis not present

## 2013-07-29 DIAGNOSIS — R319 Hematuria, unspecified: Secondary | ICD-10-CM | POA: Diagnosis not present

## 2013-07-29 MED ORDER — TERCONAZOLE 0.4 % VA CREA: 1.0000 | TOPICAL_CREAM | Freq: Every day | VAGINAL | Status: DC

## 2013-07-29 NOTE — Patient Instructions (Signed)
Follow up as needed We'll notify you of your urine results and treat as needed Use the Terconazole as directed for the yeast You can take tylenol as needed for pain/headache Try and get back to the gym- this will help your stress Call with any questions or concerns Hang in there!!!!

## 2013-07-29 NOTE — Progress Notes (Signed)
Pre visit review using our clinic review tool, if applicable. No additional management support is needed unless otherwise documented below in the visit note. 

## 2013-07-29 NOTE — Progress Notes (Signed)
   Subjective:    Patient ID: [name], female    DOB: 28-Mar-1928, 80 y.o.   MRN: [id number]  HPI UTI- pt went to UC 2 weeks ago w/ recurrent UTI.  Was treated w/ Keflex x7 days.  Was told to start Lactobacillus for UTI prevention.  Is on Trimethaprim daily.  Is allergic to Cipro.  Pt is noting a correlation between stress w/ her daughter and the onset of UTI.  Having external burning and irritation after completing abx.  Continues to have LBP and groin pain.     Review of Systems For ROS see HPI     Objective:   Physical Exam  Vitals reviewed. Constitutional: She appears well-developed and well-nourished. No distress.  Abdominal: Soft. Bowel sounds are normal. She exhibits no distension. There is tenderness (mild suprapubic TTP). There is no rebound and no guarding.  Skin: Skin is warm and dry.  Psychiatric:  anxious          Assessment & Plan:

## 2013-07-31 NOTE — Assessment & Plan Note (Signed)
New.  sxs started after completion of abx for UTI.  Start topical tx as pt is on coumadin and diflucan will alter levels.  Reviewed supportive care and red flags that should prompt return.  Pt expressed understanding and is in agreement w/ plan.

## 2013-07-31 NOTE — Assessment & Plan Note (Signed)
Pt remains on trimethoprim but had abnormal UA done at UC- no cx done- and treated w/ Keflex.  Pt again w/ abnormal UA but will send culture prior to treating w/ abx due to current vaginitis.

## 2013-08-02 ENCOUNTER — Other Ambulatory Visit: Payer: Self-pay | Admitting: General Practice

## 2013-08-02 LAB — URINE CULTURE: Colony Count: 85000

## 2013-08-02 MED ORDER — CIPROFLOXACIN HCL 500 MG PO TABS: 500.0000 mg | ORAL_TABLET | Freq: Two times a day (BID) | ORAL | Status: DC

## 2013-08-03 ENCOUNTER — Telehealth: Payer: Self-pay | Admitting: Family Medicine

## 2013-08-03 NOTE — Telephone Encounter (Signed)
Patient called and stated with her being on Cipro can she exercise and also take tylenol. Please advise.

## 2013-08-03 NOTE — Telephone Encounter (Signed)
Patient was advised to proceed with light exercise (i.e. walking and light weights) while on Cipro and to monitor for signs of tendon swelling and sudden pain.  She was informed that Cipro could cause swelling and tearing of the tendons, and if this were to occur, she was advised to stop taking the medication and to call the office for further direction.  She was also informed that it was okay to take tylenol as directed on the package as needed.  Patient stated understanding and no further questions or concerns were voiced.

## 2013-08-08 ENCOUNTER — Telehealth: Payer: Self-pay | Admitting: Family Medicine

## 2013-08-08 NOTE — Telephone Encounter (Signed)
Patient state she has finished her cipro for her UTI and she still has symptoms including a "horrible smell." She would like to know what she needs to know.

## 2013-08-08 NOTE — Telephone Encounter (Signed)
Notes Recorded by Kris Hartmann, CMA on 08/02/2013 at 10:50 AM Medication filled, pt notified. If symptoms are still persistent after treatment, Pt needs to follow up with urology.  Spoke with patient and advised per Dr.Tabori she needs to follow up with her Urologist, especially if her symptoms have not subsided. She has agreed and I told her she could call back if she is unable to get an appointment, we can recheck her UA ad culture.     KP

## 2013-08-09 ENCOUNTER — Ambulatory Visit (INDEPENDENT_AMBULATORY_CARE_PROVIDER_SITE_OTHER): Payer: Medicare Other | Admitting: Psychiatry

## 2013-08-09 DIAGNOSIS — F411 Generalized anxiety disorder: Secondary | ICD-10-CM | POA: Diagnosis not present

## 2013-08-09 DIAGNOSIS — N318 Other neuromuscular dysfunction of bladder: Secondary | ICD-10-CM | POA: Diagnosis not present

## 2013-08-09 DIAGNOSIS — N952 Postmenopausal atrophic vaginitis: Secondary | ICD-10-CM | POA: Diagnosis not present

## 2013-08-09 DIAGNOSIS — N3020 Other chronic cystitis without hematuria: Secondary | ICD-10-CM | POA: Diagnosis not present

## 2013-08-23 ENCOUNTER — Ambulatory Visit (INDEPENDENT_AMBULATORY_CARE_PROVIDER_SITE_OTHER): Payer: Medicare Other | Admitting: Psychiatry

## 2013-08-23 DIAGNOSIS — F411 Generalized anxiety disorder: Secondary | ICD-10-CM | POA: Diagnosis not present

## 2013-09-02 DIAGNOSIS — I4891 Unspecified atrial fibrillation: Secondary | ICD-10-CM | POA: Diagnosis not present

## 2013-09-02 DIAGNOSIS — G4734 Idiopathic sleep related nonobstructive alveolar hypoventilation: Secondary | ICD-10-CM | POA: Diagnosis not present

## 2013-09-02 DIAGNOSIS — Z7901 Long term (current) use of anticoagulants: Secondary | ICD-10-CM | POA: Diagnosis not present

## 2013-09-05 DIAGNOSIS — H26499 Other secondary cataract, unspecified eye: Secondary | ICD-10-CM | POA: Diagnosis not present

## 2013-09-05 DIAGNOSIS — H35379 Puckering of macula, unspecified eye: Secondary | ICD-10-CM | POA: Diagnosis not present

## 2013-09-06 ENCOUNTER — Ambulatory Visit (INDEPENDENT_AMBULATORY_CARE_PROVIDER_SITE_OTHER): Payer: Medicare Other | Admitting: Psychiatry

## 2013-09-06 DIAGNOSIS — N390 Urinary tract infection, site not specified: Secondary | ICD-10-CM | POA: Diagnosis not present

## 2013-09-06 DIAGNOSIS — F411 Generalized anxiety disorder: Secondary | ICD-10-CM | POA: Diagnosis not present

## 2013-09-06 DIAGNOSIS — N760 Acute vaginitis: Secondary | ICD-10-CM | POA: Diagnosis not present

## 2013-09-13 ENCOUNTER — Telehealth: Payer: Self-pay | Admitting: *Deleted

## 2013-09-13 NOTE — Telephone Encounter (Signed)
Caller name:  Ludella Relation to pt:  self Call back number:  [phone] Pharmacy:  [facility] on [address]  Reason for call:   Pt requesting something be called in for her to help her from continuing to get UTI's.  Please advise.  bw

## 2013-09-13 NOTE — Telephone Encounter (Signed)
Left message for call back.

## 2013-09-13 NOTE — Telephone Encounter (Signed)
Pt followed up with her urologist at [facility] on 08/09/13 .  UTI had cleared by time she went to her appointment but has since returned.  On 09/06/13, patient went for a breast exam at [facility].  At that time, she was experiencing burning on urination, so they performed a UA and indeed she had an infection.  She was not prescribed anything at that time, but was instead told to follow up with PCP.    Advise:  Patient was encouraged to call her urologist and make him aware of her symptoms.  Patient stated understanding and agree to call.  She was also encouraged to call us back if she is unable to reach them.

## 2013-09-13 NOTE — Telephone Encounter (Signed)
Pt returned call.  You can call her back at [phone].

## 2013-09-14 NOTE — Telephone Encounter (Signed)
Noted  

## 2013-09-15 DIAGNOSIS — R002 Palpitations: Secondary | ICD-10-CM | POA: Diagnosis not present

## 2013-09-15 DIAGNOSIS — R0602 Shortness of breath: Secondary | ICD-10-CM | POA: Diagnosis not present

## 2013-09-15 DIAGNOSIS — I4891 Unspecified atrial fibrillation: Secondary | ICD-10-CM | POA: Diagnosis not present

## 2013-09-21 ENCOUNTER — Ambulatory Visit (INDEPENDENT_AMBULATORY_CARE_PROVIDER_SITE_OTHER): Payer: Medicare Other | Admitting: Psychiatry

## 2013-09-21 DIAGNOSIS — F411 Generalized anxiety disorder: Secondary | ICD-10-CM

## 2013-09-23 DIAGNOSIS — Z7901 Long term (current) use of anticoagulants: Secondary | ICD-10-CM | POA: Diagnosis not present

## 2013-09-26 ENCOUNTER — Encounter: Payer: Self-pay | Admitting: Family Medicine

## 2013-09-26 ENCOUNTER — Ambulatory Visit (INDEPENDENT_AMBULATORY_CARE_PROVIDER_SITE_OTHER): Payer: Medicare Other | Admitting: Family Medicine

## 2013-09-26 VITALS — BP 126/82 | HR 85 | Temp 99.3°F | Resp 17 | Wt 160.5 lb

## 2013-09-26 DIAGNOSIS — J449 Chronic obstructive pulmonary disease, unspecified: Secondary | ICD-10-CM | POA: Insufficient documentation

## 2013-09-26 DIAGNOSIS — J441 Chronic obstructive pulmonary disease with (acute) exacerbation: Secondary | ICD-10-CM | POA: Diagnosis not present

## 2013-09-26 MED ORDER — GUAIFENESIN-CODEINE 100-10 MG/5ML PO SYRP
10.0000 mL | ORAL_SOLUTION | Freq: Three times a day (TID) | ORAL | Status: DC | PRN
Start: 2013-09-26 — End: 2013-11-17

## 2013-09-26 MED ORDER — DOXYCYCLINE HYCLATE 100 MG PO TABS: 100.0000 mg | ORAL_TABLET | Freq: Two times a day (BID) | ORAL | Status: DC

## 2013-09-26 NOTE — Progress Notes (Signed)
Pre visit review using our clinic review tool, if applicable. No additional management support is needed unless otherwise documented below in the visit note. 

## 2013-09-26 NOTE — Progress Notes (Signed)
   Subjective:    Patient ID: [name], female    DOB: 10-Jun-1927, 80 y.o.   MRN: [id number]  HPI URI- sxs started over the weekend w/ sore throat.  Reports yesterday so had temp of 103.  Temp this AM was 100.  + HA, frontal sinus pressure.  + productive cough.  No known sick contacts.  No N/V/D.  Hx of mild COPD   Review of Systems For ROS see HPI     Objective:   Physical Exam  Vitals reviewed. Constitutional: She appears well-developed and well-nourished. No distress.  HENT:  Head: Normocephalic and atraumatic.  TMs normal bilaterally Mild nasal congestion Throat w/out erythema, edema, or exudate No TTP over sinuses  Eyes: Conjunctivae and EOM are normal. Pupils are equal, round, and reactive to light.  Neck: Normal range of motion. Neck supple.  Cardiovascular: Normal rate, regular rhythm, normal heart sounds and intact distal pulses.   No murmur heard. Pulmonary/Chest: Effort normal. No respiratory distress. She has wheezes (faint scattered expiratory wheezes).  + hacking cough  Lymphadenopathy:    She has no cervical adenopathy.  Psychiatric:  Very anxious          Assessment & Plan:

## 2013-09-26 NOTE — Patient Instructions (Signed)
Follow up as needed Schedule an appt to get your coumadin checked in 1 week- the antibiotics can impact your readings Start the Doxycycline twice daily- take w/ food Drink plenty of fluids Mucinex DM for cough and congestion Codeine based cough syrup as needed for cough and congestion- will cause drowsiness REST! Call with any questions or concerns Hang in there!!!

## 2013-09-27 NOTE — Assessment & Plan Note (Addendum)
New.  Mild.  Start abx.  Cough syrup prn.  Did not do breathing tx in office due to pt's afib and high anxiety coupled w/ only faint scattered wheezes.  Reviewed need to f/u w/ coumadin clinic due to abx use as this can cause increased INR.  Reviewed supportive care and red flags that should prompt return.  Pt expressed understanding and is in agreement w/ plan.

## 2013-10-05 DIAGNOSIS — Z7901 Long term (current) use of anticoagulants: Secondary | ICD-10-CM | POA: Diagnosis not present

## 2013-10-12 ENCOUNTER — Ambulatory Visit (INDEPENDENT_AMBULATORY_CARE_PROVIDER_SITE_OTHER): Payer: Medicare Other | Admitting: Psychiatry

## 2013-10-12 DIAGNOSIS — F411 Generalized anxiety disorder: Secondary | ICD-10-CM | POA: Diagnosis not present

## 2013-10-20 DIAGNOSIS — Z7901 Long term (current) use of anticoagulants: Secondary | ICD-10-CM | POA: Diagnosis not present

## 2013-11-03 ENCOUNTER — Ambulatory Visit (INDEPENDENT_AMBULATORY_CARE_PROVIDER_SITE_OTHER): Payer: Medicare Other | Admitting: Psychiatry

## 2013-11-03 DIAGNOSIS — F411 Generalized anxiety disorder: Secondary | ICD-10-CM

## 2013-11-08 ENCOUNTER — Emergency Department (HOSPITAL_COMMUNITY)
Admission: EM | Admit: 2013-11-08 | Discharge: 2013-11-08 | Disposition: A | Discharge status: Home / Self Care | Payer: Medicare Other | Attending: Emergency Medicine | Admitting: Emergency Medicine

## 2013-11-08 ENCOUNTER — Encounter (HOSPITAL_COMMUNITY): Payer: Self-pay | Admitting: Emergency Medicine

## 2013-11-08 ENCOUNTER — Emergency Department (HOSPITAL_COMMUNITY): Payer: Medicare Other

## 2013-11-08 DIAGNOSIS — Z87891 Personal history of nicotine dependence: Secondary | ICD-10-CM | POA: Insufficient documentation

## 2013-11-08 DIAGNOSIS — Z87440 Personal history of urinary (tract) infections: Secondary | ICD-10-CM | POA: Diagnosis not present

## 2013-11-08 DIAGNOSIS — F329 Major depressive disorder, single episode, unspecified: Secondary | ICD-10-CM | POA: Diagnosis not present

## 2013-11-08 DIAGNOSIS — Y9289 Other specified places as the place of occurrence of the external cause: Secondary | ICD-10-CM | POA: Insufficient documentation

## 2013-11-08 DIAGNOSIS — IMO0002 Reserved for concepts with insufficient information to code with codable children: Secondary | ICD-10-CM | POA: Insufficient documentation

## 2013-11-08 DIAGNOSIS — S1093XA Contusion of unspecified part of neck, initial encounter: Secondary | ICD-10-CM | POA: Diagnosis not present

## 2013-11-08 DIAGNOSIS — Q248 Other specified congenital malformations of heart: Secondary | ICD-10-CM | POA: Insufficient documentation

## 2013-11-08 DIAGNOSIS — F3289 Other specified depressive episodes: Secondary | ICD-10-CM | POA: Insufficient documentation

## 2013-11-08 DIAGNOSIS — Y9341 Activity, dancing: Secondary | ICD-10-CM | POA: Insufficient documentation

## 2013-11-08 DIAGNOSIS — Z8619 Personal history of other infectious and parasitic diseases: Secondary | ICD-10-CM | POA: Insufficient documentation

## 2013-11-08 DIAGNOSIS — Z862 Personal history of diseases of the blood and blood-forming organs and certain disorders involving the immune mechanism: Secondary | ICD-10-CM | POA: Diagnosis not present

## 2013-11-08 DIAGNOSIS — S0083XA Contusion of other part of head, initial encounter: Secondary | ICD-10-CM | POA: Diagnosis not present

## 2013-11-08 DIAGNOSIS — Z792 Long term (current) use of antibiotics: Secondary | ICD-10-CM | POA: Diagnosis not present

## 2013-11-08 DIAGNOSIS — Z8739 Personal history of other diseases of the musculoskeletal system and connective tissue: Secondary | ICD-10-CM | POA: Diagnosis not present

## 2013-11-08 DIAGNOSIS — S0003XA Contusion of scalp, initial encounter: Secondary | ICD-10-CM | POA: Diagnosis not present

## 2013-11-08 DIAGNOSIS — Z8639 Personal history of other endocrine, nutritional and metabolic disease: Secondary | ICD-10-CM | POA: Insufficient documentation

## 2013-11-08 DIAGNOSIS — K219 Gastro-esophageal reflux disease without esophagitis: Secondary | ICD-10-CM | POA: Diagnosis not present

## 2013-11-08 DIAGNOSIS — I8390 Asymptomatic varicose veins of unspecified lower extremity: Secondary | ICD-10-CM | POA: Diagnosis not present

## 2013-11-08 DIAGNOSIS — Z79899 Other long term (current) drug therapy: Secondary | ICD-10-CM | POA: Insufficient documentation

## 2013-11-08 DIAGNOSIS — T1490XA Injury, unspecified, initial encounter: Secondary | ICD-10-CM | POA: Diagnosis not present

## 2013-11-08 DIAGNOSIS — F411 Generalized anxiety disorder: Secondary | ICD-10-CM | POA: Insufficient documentation

## 2013-11-08 DIAGNOSIS — Z880 Allergy status to penicillin: Secondary | ICD-10-CM | POA: Insufficient documentation

## 2013-11-08 DIAGNOSIS — W1809XA Striking against other object with subsequent fall, initial encounter: Secondary | ICD-10-CM | POA: Diagnosis not present

## 2013-11-08 DIAGNOSIS — G20 Parkinson's disease: Secondary | ICD-10-CM | POA: Diagnosis not present

## 2013-11-08 DIAGNOSIS — G20A1 Parkinson's disease without dyskinesia, without mention of fluctuations: Secondary | ICD-10-CM | POA: Insufficient documentation

## 2013-11-08 DIAGNOSIS — S0990XA Unspecified injury of head, initial encounter: Secondary | ICD-10-CM

## 2013-11-08 LAB — BASIC METABOLIC PANEL
Anion gap: 9 (ref 5–15)
BUN: 14 mg/dL (ref 6–23)
CO2: 28 mEq/L (ref 19–32)
Calcium: 9.1 mg/dL (ref 8.4–10.5)
Chloride: 99 mEq/L (ref 96–112)
Creatinine, Ser: 0.87 mg/dL (ref 0.50–1.10)
GFR calc Af Amer: 69 mL/min — ABNORMAL LOW (ref >90)
GFR, EST NON AFRICAN AMERICAN: 60 mL/min — AB (ref >90)
Glucose, Bld: 106 mg/dL — ABNORMAL HIGH (ref 70–99)
Potassium: 4.3 mEq/L (ref 3.7–5.3)
Sodium: 136 mEq/L — ABNORMAL LOW (ref 137–147)

## 2013-11-08 LAB — PROTIME-INR
INR: 3.52 — AB (ref 0.00–1.49)
Prothrombin Time: 35.3 seconds — ABNORMAL HIGH (ref 11.6–15.2)

## 2013-11-08 LAB — CBC
HEMATOCRIT: 33.9 % — AB (ref 36.0–46.0)
Hemoglobin: 11 g/dL — ABNORMAL LOW (ref 12.0–15.0)
MCH: 30.3 pg (ref 26.0–34.0)
MCHC: 32.4 g/dL (ref 30.0–36.0)
MCV: 93.4 fL (ref 78.0–100.0)
Platelets: 221 10*3/uL (ref 150–400)
RBC: 3.63 MIL/uL — ABNORMAL LOW (ref 3.87–5.11)
RDW: 15.1 % (ref 11.5–15.5)
WBC: 6.3 10*3/uL (ref 4.0–10.5)

## 2013-11-08 MED ORDER — ACETAMINOPHEN 325 MG PO TABS
650.0000 mg | ORAL_TABLET | Freq: Once | ORAL | Status: AC
  Administered 2013-11-08: 650 mg via ORAL
  Filled 2013-11-08: qty 2

## 2013-11-08 NOTE — ED Notes (Signed)
Per EMS: pt from [facility] and [employer]; pt slipped and fell landing on left knee; pt with hematoma to left forehead and left eye; pt takes coumadin; pt denies LOC or neck or back pain

## 2013-11-08 NOTE — ED Notes (Signed)
Patient transported to CT 

## 2013-11-08 NOTE — ED Provider Notes (Signed)
CSN: [id number]     Arrival date & time 11/08/13  1623 History   First MD Initiated Contact with Patient 11/08/13 1628     Chief Complaint  Patient presents with  . Fall     (Consider location/radiation/quality/duration/timing/severity/associated sxs/prior Treatment) Patient is a 80 y.o. female presenting with fall.  Fall This is a new problem. The current episode started today. The problem occurs rarely. The problem has been unchanged. Pertinent negatives include no abdominal pain, chest pain, chills, coughing, fever, headaches, nausea, numbness, rash, sore throat, visual change or vomiting. Nothing aggravates the symptoms. She has tried nothing for the symptoms.    Past Medical History  Diagnosis Date  . Arthritis   . Hyperlipidemia   . 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 Chronic constipation   . Hemorrhoid   . Varicose veins   . Anxiety   . Depression   . GERD (gastroesophageal reflux disease)   . Atrial fibrillation   . Recurrent UTI   . Parkinson disease   . Cardiac arrhythmia due to congenital heart disease   . Mycotic toenails 10/27/2012   Past Surgical History  Procedure Laterality Date  . Appendectomy    . Ileal resection  03/2007    Due to obstruction   Family History  Problem Relation Age of Onset  . Asthma Brother   . Alcohol abuse Brother   . Throat cancer Father   . Alcohol abuse Father   . Lupus Daughter   . Bipolar disorder Daughter   . Lupus Daughter   . Anxiety disorder Daughter   . Alcohol abuse Sister   . Alcohol abuse Maternal Grandfather   . Alcohol abuse Paternal Grandmother    History  Substance Use Topics  . Smoking status: Former Smoker -- 2.00 packs/day for 30 years    Types: Cigarettes    Quit date: 04/21/1980  . Smokeless tobacco: Never Used     Comment: onset age 18 -89, up to > 1ppd (almost 2 ppd)  . Alcohol Use: No   OB History   Grav Para Term Preterm Abortions TAB SAB Ect Mult Living                 Review of  Systems  Constitutional: Negative for fever and chills.  HENT: Negative for sore throat.   Eyes: Negative for pain.  Respiratory: Negative for cough and shortness of breath.   Cardiovascular: Negative for chest pain.  Gastrointestinal: Negative for nausea, vomiting, abdominal pain and diarrhea.  Genitourinary: Negative for dysuria.  Musculoskeletal: Negative for back pain.  Skin: Negative for rash.  Neurological: Negative for numbness and headaches.      Allergies  Adhesive; Amoxicillin; Ciprofloxacin; and Ultram  Home Medications   Prior to Admission medications   Medication Sig Start Date End Date Taking? Authorizing Provider  albuterol (VENTOLIN HFA) 108 (90 BASE) MCG/ACT inhaler Inhale 2 puffs into the lungs 4 (four) times daily as needed. For shortness of breath    Historical Provider, MD  Calcium Carbonate-Vitamin D (CALCIUM 600+D) 600-400 MG-UNIT per tablet Take 1 tablet by mouth 2 (two) times daily.      Historical Provider, MD  Cranberry-Vitamin C-Vitamin E (CRANBERRY SUPER STRENGTH) 6000-100-3 MG-MG-UNIT CAPS Take 1 capsule by mouth every morning.      Historical Provider, MD  doxycycline (VIBRA-TABS) 100 MG tablet Take 1 tablet (100 mg total) by mouth 2 (two) times daily. 09/26/13   Midge Minium, MD  fish oil-omega-3 fatty acids 1000 MG capsule  Take 1 g by mouth every morning.     Historical Provider, MD  guaiFENesin-codeine (ROBITUSSIN AC) 100-10 MG/5ML syrup Take 10 mLs by mouth 3 (three) times daily as needed for cough. 09/26/13   Midge Minium, MD  Hypromellose (ARTIFICIAL TEARS OP) Apply 1 drop to eye 2 (two) times daily as needed (dry eye).    Historical Provider, MD  meclizine (ANTIVERT) 50 MG tablet Take 1 tablet (50 mg total) by mouth 3 (three) times daily as needed. 06/17/13   Tanna Furry, MD  pantoprazole (PROTONIX) 40 MG tablet  06/18/13   Historical Provider, MD  sertraline (ZOLOFT) 100 MG tablet 1.5 tabs daily (150mg  daily) 06/03/13   Midge Minium, MD   terconazole (TERAZOL 7) 0.4 % vaginal cream Place 1 applicator vaginally at bedtime. 07/29/13   Midge Minium, MD  warfarin (COUMADIN) 2.5 MG tablet Take 2.5-5 mg by mouth at bedtime. Take 2.5mg  by mouth on Tues. Take 5mg  by mouth all other days    Historical Provider, MD   BP 106/68  Pulse 60  Temp(Src) 98.1 F (36.7 C) (Oral)  Resp 18  SpO2 99% Physical Exam  Constitutional: She is oriented to person, place, and time. She appears well-developed and well-nourished. No distress.  HENT:  Head: Normocephalic. Head is with contusion (small contusion to L forehead).  Eyes: Pupils are equal, round, and reactive to light. Right eye exhibits no discharge. Left eye exhibits no discharge.  Neck: Normal range of motion.  Cardiovascular: Normal rate, regular rhythm and normal heart sounds.   Pulmonary/Chest: Effort normal and breath sounds normal.  Abdominal: Soft. She exhibits no distension. There is no tenderness.  Musculoskeletal: Normal range of motion.       Left knee: She exhibits laceration (small abrasion). She exhibits no deformity. No tenderness found.  Neurological: She is alert and oriented to person, place, and time.  Skin: Skin is warm. She is not diaphoretic.    ED Course  Procedures (including critical care time) Labs Review Labs Reviewed  CBC - Abnormal; Notable for the following:    RBC 3.63 (*)    Hemoglobin 11.0 (*)    HCT 33.9 (*)    All other components within normal limits  BASIC METABOLIC PANEL - Abnormal; Notable for the following:    Sodium 136 (*)    Glucose, Bld 106 (*)    GFR calc non Af Amer 60 (*)    GFR calc Af Amer 69 (*)    All other components within normal limits  PROTIME-INR - Abnormal; Notable for the following:    Prothrombin Time 35.3 (*)    INR 3.52 (*)    All other components within normal limits    Imaging Review Ct Head Wo Contrast  11/08/2013   CLINICAL DATA:  Fall  EXAM: CT HEAD WITHOUT CONTRAST  TECHNIQUE: Contiguous axial  images were obtained from the base of the skull through the vertex without intravenous contrast.  COMPARISON:  06/17/2013  FINDINGS: No skull fracture is noted. There is scalp swelling in left frontal region. Small subcutaneous hematoma left frontal scalp measures 1.5 cm length by 6 mm thickness. Stable cerebral atrophy. No intracranial hemorrhage, mass effect or midline shift. No acute cortical infarction. No mass lesion is noted on this unenhanced scan. Again noted periventricular and subcortical chronic white matter disease. Atherosclerotic calcifications of carotid siphon.  IMPRESSION: No acute intracranial abnormality. There is scalp swelling and small subcutaneous hematoma in left frontal region. Stable atrophy and chronic white  matter disease.   Electronically Signed   By: Lahoma Crocker M.D.   On: 11/08/2013 17:46     EKG Interpretation None      MDM   Final diagnoses:  Head injury, initial encounter   79 year old female with medical history as above, pertinent for atrial ablation on Coumadin presents after falling dancing and striking her left knee and the left side of her face.  On arrival patient is hemodynamically stable and in no acute distress. She is awake alert and oriented. She states that she has some mild pain in the left side of her forehead. There is an associated abrasion there. There is not a significant amount of swelling. There is a mild abrasion to the left knee but the patient has full range of motion without pain of the joint there is no significant swelling. Plan is to obtain a CT scan of the head given the patient's age and anticoagulant status.  CT scan demonstrates scalp swelling and hematoma with no intracranial abnormality. Patient with mild headache given Tylenol. Strict return progression for given. Patient able to be discharged in stable condition. Patient is comfortable with this plan. Patient to followup with primary care physician as needed. Patient was seen and  evaluated by myself and by the attending Dr. Kathrynn Humble.     Freddi Che, MD 11/08/13 [phone]

## 2013-11-08 NOTE — ED Notes (Signed)
Returned from CT.

## 2013-11-09 NOTE — ED Provider Notes (Signed)
I saw and evaluated the patient, reviewed the resident's note and I agree with the findings and plan.   EKG Interpretation None       Pt with fall. On comuadin, CT head ordered and is neg. Ambulating and rest of the exam is benign. Stable for d.c  Varney Biles, MD 11/09/13 [phone]

## 2013-11-17 ENCOUNTER — Ambulatory Visit (HOSPITAL_BASED_OUTPATIENT_CLINIC_OR_DEPARTMENT_OTHER)
Admission: RE | Admit: 2013-11-17 | Discharge: 2013-11-17 | Disposition: A | Discharge status: Home / Self Care | Payer: Medicare Other | Source: Ambulatory Visit | Attending: Family Medicine | Admitting: Family Medicine

## 2013-11-17 ENCOUNTER — Encounter: Payer: Self-pay | Admitting: Family Medicine

## 2013-11-17 ENCOUNTER — Ambulatory Visit (INDEPENDENT_AMBULATORY_CARE_PROVIDER_SITE_OTHER): Payer: Medicare Other | Admitting: Family Medicine

## 2013-11-17 VITALS — BP 122/80 | HR 72 | Temp 98.3°F | Resp 16 | Wt 161.5 lb

## 2013-11-17 DIAGNOSIS — W010XXA Fall on same level from slipping, tripping and stumbling without subsequent striking against object, initial encounter: Secondary | ICD-10-CM | POA: Diagnosis not present

## 2013-11-17 DIAGNOSIS — R51 Headache: Secondary | ICD-10-CM | POA: Insufficient documentation

## 2013-11-17 DIAGNOSIS — S060X0A Concussion without loss of consciousness, initial encounter: Secondary | ICD-10-CM | POA: Insufficient documentation

## 2013-11-17 DIAGNOSIS — S0083XA Contusion of other part of head, initial encounter: Secondary | ICD-10-CM | POA: Insufficient documentation

## 2013-11-17 DIAGNOSIS — G319 Degenerative disease of nervous system, unspecified: Secondary | ICD-10-CM | POA: Diagnosis not present

## 2013-11-17 DIAGNOSIS — S1093XA Contusion of unspecified part of neck, initial encounter: Secondary | ICD-10-CM | POA: Diagnosis not present

## 2013-11-17 DIAGNOSIS — S0003XA Contusion of scalp, initial encounter: Secondary | ICD-10-CM

## 2013-11-17 DIAGNOSIS — Z7901 Long term (current) use of anticoagulants: Secondary | ICD-10-CM | POA: Diagnosis not present

## 2013-11-17 DIAGNOSIS — W19XXXA Unspecified fall, initial encounter: Secondary | ICD-10-CM | POA: Diagnosis not present

## 2013-11-17 NOTE — Progress Notes (Signed)
   Subjective:    Patient ID: [name], female    DOB: July 05, 1927, 80 y.o.   MRN: [id number]  HPI ER F/U- pt fell on 7/21 at [facility].  Hit L side of forehead.  Had CT scan showing scalp hematoma.  Now w/ 2 black eyes.  Taking tylenol 3x/day 1 tab at a time b/c 'it hurts'.  Pain is worse than previous.  Ongoing HA, fatigue.  Denies dizziness or visual changes.  Pain is not localized to one particularly area.  Pt is icing but doesn't know how often or for how long.  Is on long term coumadin.  This is not pt's 1st fall.     Review of Systems For ROS see HPI     Objective:   Physical Exam  Vitals reviewed. Constitutional: She is oriented to person, place, and time. She appears well-developed and well-nourished.  Very anxious  HENT:  L frontal hematoma- TTP  Eyes:  Bilateral black eyes  Musculoskeletal: She exhibits no edema.  Neurological: She is alert and oriented to person, place, and time. No cranial nerve deficit. Coordination (stooped, shuffling gait) abnormal.  Skin: Skin is warm.  Psychiatric:  anxious          Assessment & Plan:

## 2013-11-17 NOTE — Assessment & Plan Note (Signed)
New.  Pt continues to have HA, fatigue.  Denies dizziness or visual changes.  Had normal head CT in ER but due to chronic coumadin use will repeat CT to ensure there is not a slow bleed/accumulation.  Gave specific instructions on home care as pt was very distraught that she did not receive this when she was d/c'd from ER.  Will follow.

## 2013-11-17 NOTE — Patient Instructions (Signed)
Follow up in 1 week to recheck headache Take 2 tylenol every 4-6 hrs as needed for pain Continue to ice for 20 minutes every other hour DO NOT exercise until you are headache free Drink plenty of fluids to avoid dehydration, headache, bladder spasms We will notify you of your CT results It is ok to rest when you need to- this will help you heal Call with any questions or concerns Hang in there!

## 2013-11-17 NOTE — Progress Notes (Signed)
Pre visit review using our clinic review tool, if applicable. No additional management support is needed unless otherwise documented below in the visit note. 

## 2013-11-17 NOTE — Assessment & Plan Note (Signed)
Pt continues to fall.  Suspect this is due to her Parkinson's but pt remains in denial that she has Parkinson's.  Has Neuro.  Encouraged her to f/u- pt reports she is not interested in this.

## 2013-11-21 ENCOUNTER — Encounter: Payer: Self-pay | Admitting: Physician Assistant

## 2013-11-21 ENCOUNTER — Ambulatory Visit (INDEPENDENT_AMBULATORY_CARE_PROVIDER_SITE_OTHER): Payer: Medicare Other | Admitting: Physician Assistant

## 2013-11-21 ENCOUNTER — Telehealth: Payer: Self-pay | Admitting: Family Medicine

## 2013-11-21 VITALS — BP 104/80 | HR 67 | Temp 98.0°F | Resp 14 | Ht 70.5 in | Wt 157.5 lb

## 2013-11-21 DIAGNOSIS — S060X0D Concussion without loss of consciousness, subsequent encounter: Secondary | ICD-10-CM

## 2013-11-21 DIAGNOSIS — S060XAA Concussion with loss of consciousness status unknown, initial encounter: Secondary | ICD-10-CM | POA: Insufficient documentation

## 2013-11-21 DIAGNOSIS — R51 Headache: Secondary | ICD-10-CM

## 2013-11-21 DIAGNOSIS — Z5189 Encounter for other specified aftercare: Secondary | ICD-10-CM

## 2013-11-21 DIAGNOSIS — S060X0A Concussion without loss of consciousness, initial encounter: Secondary | ICD-10-CM | POA: Diagnosis not present

## 2013-11-21 DIAGNOSIS — S060X9A Concussion with loss of consciousness of unspecified duration, initial encounter: Secondary | ICD-10-CM | POA: Insufficient documentation

## 2013-11-21 LAB — CBC
HCT: 37.9 % (ref 36.0–46.0)
HEMOGLOBIN: 12.7 g/dL (ref 12.0–15.0)
MCH: 30.5 pg (ref 26.0–34.0)
MCHC: 33.5 g/dL (ref 30.0–36.0)
MCV: 91.1 fL (ref 78.0–100.0)
Platelets: 246 10*3/uL (ref 150–400)
RBC: 4.16 MIL/uL (ref 3.87–5.11)
RDW: 15.2 % (ref 11.5–15.5)
WBC: 5.9 10*3/uL (ref 4.0–10.5)

## 2013-11-21 LAB — SEDIMENTATION RATE: Sed Rate: 13 mm/hr (ref 0–22)

## 2013-11-21 MED ORDER — LIDOCAINE 5 % EX OINT: 1.0000 "application " | TOPICAL_OINTMENT | Freq: Two times a day (BID) | CUTANEOUS | Status: DC | PRN

## 2013-11-21 NOTE — Assessment & Plan Note (Signed)
With persistent and worsening symptoms. CT scan negative.  With PCS, CT scan can be unremarkable for findings in 30% of cases where MRI will show subtle bleeds or lesions (Per Up-to-Date).  Because of chronic anticoagulation and worsening symptoms, will proceed with MRI. Will obtain CBC and ESR due to location of tenderness, although I have little concern for temporal arteritis.  Patient to continue tylenol as directed.  Continue ice packs.  Lidocaine ointment to apply topically to area of significant tenderness.  Discussed alarm signs/symptoms and when to proceed to the ER if indicated.  Reviewed with patient that this is most likely post-concussion syndrome with some aggravated tenderness due to resolving soft-tissue hematoma.  Labs and imaging will r/o more worrisome conditions.

## 2013-11-21 NOTE — Telephone Encounter (Signed)
Pt has an appointment scheduled with Elyn Aquas, PA today at 1000 am.

## 2013-11-21 NOTE — Telephone Encounter (Signed)
Patient Information:  Caller Name: Envi  Phone: [phone]  Patient: [name], [name]  Gender: Female  DOB: April 09, 1928  Age: 80 Years  PCP: Midge Minium.  Office Follow Up:  Does the office need to follow up with this patient?: No  Instructions For The Office: N/A  RN Note:  Blurred vision intermittently  over past "few days" with constant headache and waves of prickling sensation in head.  Occasional nausea without vomiting.  No appointments remain at [place].  Scheduled for 1000 11/21/13 with Teresita Madura, PA at [facility] office.  Myrtie is going to try to find someone to drive her to appointment.  If unable, agreed to call office ASAP to reschedule.  Symptoms  Reason For Call & Symptoms: Waves of prickling sensation in head and increased sleeping after diagnosed with concussion.  [place]  Reviewed Health History In EMR: Yes  Reviewed Medications In EMR: Yes  Reviewed Allergies In EMR: Yes  Reviewed Surgeries / Procedures: Yes  Date of Onset of Symptoms: 11/17/2013  Treatments Tried: ED eval, CT X 2  Treatments Tried Worked: No  Guideline(s) Used:  Concussion (mTBI) Less Than 14 Days Ago Follow-Up Call  Disposition Per Guideline:   See Today in Office  Reason For Disposition Reached:   [1] Concussion symptoms SAME (not worse) AND [2] taking Coumadin (warfarin), Pradaxa (dabigatran), or known bleeding disorder (e.g., thrombocytopenia)  Advice Given:  Reassurance - Symptoms Staying the Same:  I am sorry to hear that your concussion symptoms are not getting better.  Here is some general information on concussion.  Definition:  The term concussion means the same thing as mild traumatic brain (mTBI) injury.  A concussion is caused by a bump, blow, or jolt to the head. A person can get a concussion without being knocked out.  The symptoms of a concussion are caused by a short-term decrease in brain function.  Symptoms:  Headache, nausea, and feeling irritable and sleepy are common, especially during the first couple days after a concussion.  Other symptoms of a concussion include amnesia (can't remember what happened), dizziness, difficulty concentrating or "foggy" feeling, feeling tired, feeling dazed or not your normal self, and decreased coordination.  Treatment:  Medication: There is no medication that makes a concussion get better quicker. Pain medication can be taken for headache. Acetaminophen (e.g., Tylenol) is usually a good choice.  Physical Rest: During recovery, a person with a concussion needs physical rest until their concussion symptoms have cleared. Athletes and people with physically active jobs should not return to full play or work if their concussion symptoms persist. Returning to work/play often requires a step-by-step approach. The person gradually increases their activity one step at a time if there are no concussion symptoms.  Expected Course:  The majority (80-90%) of concussion symptoms resolve within 7-10 days.  Call Back If:   Loss of speech or garbled speech  Weakness on one side of the body  Difficult to awaken or acting very confused  Severe headache, not improved with pain medication  Worsening or new concussion symptoms (especially increasing headache or repeated vomiting)  You become worse.  Patient Will Follow Care Advice:  YES  Appointment Scheduled:  11/21/2013 10:00:00 Appointment Scheduled Provider:  Other

## 2013-11-21 NOTE — Progress Notes (Signed)
Pre visit review using our clinic review tool, if applicable. No additional management support is needed unless otherwise documented below in the visit note/SLS  

## 2013-11-21 NOTE — Progress Notes (Signed)
Patient presents to clinic today c/o continued headaches and fatigue s/p a fall that occurred on 11/08/2013.  [place].  No LOC per patient.  Was evaluated in the ER on 11/08/13.  CT negative for acute brain abnormality.  Mild scalp hematoma noted on imaging and provider's examination.  Patient sent home with instructions for headache treatment. Patient follow-ed up with her PCP on 11/17/13, noting continued headaches and fatigue.  Repeat CT scan obtained due to patient's chronic use of coumadin, to r/o slow brain bleed.  This again was negative except for improving scalp hematoma. Patient instructed to take Tylenol every 4 hours. Was told to follow-up in 1 week.  Today, patient states that headaches have persisted.  Endorses continued fatigue and intermittent nausea as well.  Described headaches as stabbing in nature, left-sided.  Also endorses some left-sided facial tingling.  Denies vision changes, AMS, facial drooping or weakness.  Past Medical History  Diagnosis Date  . Arthritis   . Hyperlipidemia   . 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 Chronic constipation   . Hemorrhoid   . Varicose veins   . Anxiety   . Depression   . GERD (gastroesophageal reflux disease)   . Atrial fibrillation   . Recurrent UTI   . Parkinson disease   . Cardiac arrhythmia due to congenital heart disease   . Mycotic toenails 10/27/2012    Current Outpatient Prescriptions on File Prior to Visit  Medication Sig Dispense Refill  . albuterol (VENTOLIN HFA) 108 (90 BASE) MCG/ACT inhaler Inhale 2 puffs into the lungs 4 (four) times daily as needed. For shortness of breath      . Calcium Carbonate-Vitamin D (CALCIUM 600+D) 600-400 MG-UNIT per tablet Take 1 tablet by mouth 2 (two) times daily.        . Cranberry-Vitamin C-Vitamin E (CRANBERRY SUPER STRENGTH) 6000-100-3 MG-MG-UNIT CAPS Take 1 capsule by mouth every morning.        . fish oil-omega-3 fatty acids 1000 MG capsule Take 1 g by mouth  every morning.       . Hypromellose (ARTIFICIAL TEARS OP) Apply 1 drop to eye 2 (two) times daily as needed (dry eye).      . vitamin B-12 (CYANOCOBALAMIN) 100 MCG tablet Take 100 mcg by mouth daily.      Marland Kitchen warfarin (COUMADIN) 2.5 MG tablet Take 2.5-5 mg by mouth at bedtime. Take 2.$RemoveBeforeD'5mg'sOQtRxqEANxEmV$  by mouth on Tues. Take $RemoveBe'5mg'PKLnqfnpE$  by mouth all other days       No current facility-administered medications on file prior to visit.    Allergies  Allergen Reactions  . Adhesive [Tape] Other (See Comments)    Reaction :  blisters  . Amoxicillin Other (See Comments)    Reaction : thrush  . Ciprofloxacin Other (See Comments)    Reaction : thrush  . Ultram [Tramadol]     hypotension    Family History  Problem Relation Age of Onset  . Asthma Brother   . Alcohol abuse Brother   . Throat cancer Father   . Alcohol abuse Father   . Lupus Daughter   . Bipolar disorder Daughter   . Lupus Daughter   . Anxiety disorder Daughter   . Alcohol abuse Sister   . Alcohol abuse Maternal Grandfather   . Alcohol abuse Paternal Grandmother     History   Social History  . Marital Status: Widowed    Spouse Name: N/A    Number of Children: N/A  . Years  of Education: N/A   Social History Main Topics  . Smoking status: Former Smoker -- 2.00 packs/day for 30 years    Types: Cigarettes    Quit date: 04/21/1980  . Smokeless tobacco: Never Used     Comment: onset age 89 -87, up to > 1ppd (almost 2 ppd)  . Alcohol Use: No  . Drug Use: No  . Sexual Activity: No   Other Topics Concern  . None   Social History Narrative   Widowed, lives alone. 1 child in [place] and 1 in [place].   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   Review of Systems - See HPI.  All other ROS are negative.  BP 104/80  Pulse 67  Temp(Src) 98 F (36.7 C) (Oral)  Resp 14  Ht 5' 10.5" (1.791 m)  Wt 157 lb 8 oz (71.442 kg)  BMI 22.27 kg/m2  SpO2 95%  Physical Exam  Constitutional: She is oriented to person, place, and time and well-developed,  well-nourished, and in no distress.  HENT:  Head: Normocephalic and atraumatic.  Right Ear: External ear normal.  Left Ear: External ear normal.  Nose: Nose normal.  Mouth/Throat: Oropharynx is clear and moist. No oropharyngeal exudate.  TM within normal limits bilaterally.  Tenderness noted over the left temporal area.  Temporal artery is not enlarged, hardened or rope-like upon palpation.  Eyes: Conjunctivae are normal. Pupils are equal, round, and reactive to light.  Neck: Neck supple.  Neurological: She is alert and oriented to person, place, and time. No cranial nerve deficit.  Sensation of trigeminal nerve intact bilaterally.  Patient able to distinguish soft vs sharp touch.  Skin: Skin is warm and dry. No rash noted.  Psychiatric: Affect normal.    Recent Results (from the past 2160 hour(s))  CBC     Status: Abnormal   Collection Time    11/08/13  5:05 PM      Result Value Ref Range   WBC 6.3  4.0 - 10.5 K/uL   RBC 3.63 (*) 3.87 - 5.11 MIL/uL   Hemoglobin 11.0 (*) 12.0 - 15.0 g/dL   HCT 33.9 (*) 36.0 - 46.0 %   MCV 93.4  78.0 - 100.0 fL   MCH 30.3  26.0 - 34.0 pg   MCHC 32.4  30.0 - 36.0 g/dL   RDW 15.1  11.5 - 15.5 %   Platelets 221  150 - 400 K/uL  BASIC METABOLIC PANEL     Status: Abnormal   Collection Time    11/08/13  5:05 PM      Result Value Ref Range   Sodium 136 (*) 137 - 147 mEq/L   Potassium 4.3  3.7 - 5.3 mEq/L   Chloride 99  96 - 112 mEq/L   CO2 28  19 - 32 mEq/L   Glucose, Bld 106 (*) 70 - 99 mg/dL   BUN 14  6 - 23 mg/dL   Creatinine, Ser 0.87  0.50 - 1.10 mg/dL   Calcium 9.1  8.4 - 10.5 mg/dL   GFR calc non Af Amer 60 (*) >90 mL/min   GFR calc Af Amer 69 (*) >90 mL/min   Comment: (NOTE)     The eGFR has been calculated using the CKD EPI equation.     This calculation has not been validated in all clinical situations.     eGFR's persistently <90 mL/min signify possible Chronic Kidney     Disease.   Anion gap 9  5 - 15  PROTIME-INR  Status:  Abnormal   Collection Time    11/08/13  5:05 PM      Result Value Ref Range   Prothrombin Time 35.3 (*) 11.6 - 15.2 seconds   INR 3.52 (*) 0.00 - 1.49    Assessment/Plan: Concussion without loss of consciousness With persistent and worsening symptoms. CT scan negative.  With PCS, CT scan can be unremarkable for findings in 30% of cases where MRI will show subtle bleeds or lesions (Per Up-to-Date).  Because of chronic anticoagulation and worsening symptoms, will proceed with MRI. Will obtain CBC and ESR due to location of tenderness, although I have little concern for temporal arteritis.  Patient to continue tylenol as directed.  Continue ice packs.  Lidocaine ointment to apply topically to area of significant tenderness.  Discussed alarm signs/symptoms and when to proceed to the ER if indicated.  Reviewed with patient that this is most likely post-concussion syndrome with some aggravated tenderness due to resolving soft-tissue hematoma.  Labs and imaging will r/o more worrisome conditions.

## 2013-11-21 NOTE — Patient Instructions (Signed)
Please obtain labs.  I will call you with your results.  Stop by the front desk so that Anderson Malta can schedule your MRI.  Continue Tylenol as directed.  Use lidocaine as directed, applying to the affected area to help numb some of the tenderness. Continue Ice to area.  If symptoms acutely worsen, please proceed to the ER.

## 2013-11-22 ENCOUNTER — Ambulatory Visit: Payer: Medicare Other | Admitting: Psychiatry

## 2013-11-23 ENCOUNTER — Ambulatory Visit (INDEPENDENT_AMBULATORY_CARE_PROVIDER_SITE_OTHER): Payer: Medicare Other

## 2013-11-23 DIAGNOSIS — R51 Headache: Secondary | ICD-10-CM

## 2013-11-23 DIAGNOSIS — Z5189 Encounter for other specified aftercare: Secondary | ICD-10-CM

## 2013-11-23 DIAGNOSIS — S1093XA Contusion of unspecified part of neck, initial encounter: Secondary | ICD-10-CM | POA: Diagnosis not present

## 2013-11-23 DIAGNOSIS — S0003XA Contusion of scalp, initial encounter: Secondary | ICD-10-CM | POA: Diagnosis not present

## 2013-11-24 ENCOUNTER — Ambulatory Visit: Payer: Medicare Other | Admitting: Podiatrist

## 2013-12-01 ENCOUNTER — Ambulatory Visit (INDEPENDENT_AMBULATORY_CARE_PROVIDER_SITE_OTHER): Payer: Medicare Other | Admitting: Podiatrist

## 2013-12-01 DIAGNOSIS — M79609 Pain in unspecified limb: Secondary | ICD-10-CM

## 2013-12-01 DIAGNOSIS — B351 Tinea unguium: Secondary | ICD-10-CM

## 2013-12-01 DIAGNOSIS — M79676 Pain in unspecified toe(s): Principal | ICD-10-CM

## 2013-12-01 NOTE — Progress Notes (Signed)
HPI: Patient presents today for follow up of foot and nail care.she sustained a head injury a couple weeks ago at the [place].  She fell as she was leaving the theater and hit her head on concrete.  She states her she still has a little headache and floaters but is otherwise ok.  She is still on coumadin.\  \Objective: Patients chart is reviewed. Vascular status reveals pedal pulses noted at 2 out of 4 dp and pt bilateral . Neurological sensation is Normal to [employer] monofilament bilateral. Patients nails are thickened, discolored, distrophic, friable and brittle with yellow-brown discoloration. Patient subjectively relates they are painful with shoes and with ambulation of bilateral feet.   Assessment: Symptomatic onychomycosis   Plan: Discussed treatment options and alternatives. The symptomatic toenails were debrided through manual an mechanical means without complication. Return appointment recommended at routine intervals of 3 months

## 2013-12-02 ENCOUNTER — Encounter: Payer: Self-pay | Admitting: Family Medicine

## 2013-12-02 ENCOUNTER — Ambulatory Visit (INDEPENDENT_AMBULATORY_CARE_PROVIDER_SITE_OTHER): Payer: Medicare Other | Admitting: Family Medicine

## 2013-12-02 VITALS — BP 120/84 | HR 66 | Temp 97.9°F | Resp 17 | Ht 71.0 in | Wt 161.2 lb

## 2013-12-02 DIAGNOSIS — Z0000 Encounter for general adult medical examination without abnormal findings: Secondary | ICD-10-CM | POA: Diagnosis not present

## 2013-12-02 DIAGNOSIS — J449 Chronic obstructive pulmonary disease, unspecified: Secondary | ICD-10-CM | POA: Diagnosis not present

## 2013-12-02 DIAGNOSIS — S32009A Unspecified fracture of unspecified lumbar vertebra, initial encounter for closed fracture: Secondary | ICD-10-CM

## 2013-12-02 DIAGNOSIS — R82998 Other abnormal findings in urine: Secondary | ICD-10-CM | POA: Diagnosis not present

## 2013-12-02 DIAGNOSIS — Z79899 Other long term (current) drug therapy: Secondary | ICD-10-CM | POA: Diagnosis not present

## 2013-12-02 DIAGNOSIS — I4891 Unspecified atrial fibrillation: Secondary | ICD-10-CM

## 2013-12-02 DIAGNOSIS — G20 Parkinson's disease: Secondary | ICD-10-CM

## 2013-12-02 DIAGNOSIS — F411 Generalized anxiety disorder: Secondary | ICD-10-CM | POA: Diagnosis not present

## 2013-12-02 DIAGNOSIS — D72829 Elevated white blood cell count, unspecified: Secondary | ICD-10-CM

## 2013-12-02 DIAGNOSIS — J4489 Other specified chronic obstructive pulmonary disease: Secondary | ICD-10-CM | POA: Diagnosis not present

## 2013-12-02 DIAGNOSIS — Z13220 Encounter for screening for lipoid disorders: Secondary | ICD-10-CM

## 2013-12-02 LAB — POCT URINALYSIS DIPSTICK
BILIRUBIN UA: NEGATIVE
Glucose, UA: NEGATIVE
KETONES UA: NEGATIVE
NITRITE UA: NEGATIVE
PH UA: 6
Protein, UA: NEGATIVE
RBC UA: NEGATIVE
Spec Grav, UA: 1.01
Urobilinogen, UA: 0.2

## 2013-12-02 LAB — HEPATIC FUNCTION PANEL
ALBUMIN: 3.7 g/dL (ref 3.5–5.2)
ALK PHOS: 64 U/L (ref 39–117)
ALT: 14 U/L (ref 0–35)
AST: 19 U/L (ref 0–37)
Bilirubin, Direct: 0.1 mg/dL (ref 0.0–0.3)
Total Bilirubin: 0.8 mg/dL (ref 0.2–1.2)
Total Protein: 6.7 g/dL (ref 6.0–8.3)

## 2013-12-02 LAB — TSH: TSH: 2.53 u[IU]/mL (ref 0.35–4.50)

## 2013-12-02 NOTE — Addendum Note (Signed)
Addended by: Modena Morrow D on: 12/02/2013 03:29 PM   Modules accepted: Orders

## 2013-12-02 NOTE — Assessment & Plan Note (Signed)
Ongoing issue for pt.  She admits that she is in denial about dx.  Has not seen neuro recently.  Wants to know about possible progression of sxs and physical limitations.  Instructed her to discuss this w/ neuro- referral provided.

## 2013-12-02 NOTE — Progress Notes (Signed)
Pre visit review using our clinic review tool, if applicable. No additional management support is needed unless otherwise documented below in the visit note. 

## 2013-12-02 NOTE — Assessment & Plan Note (Signed)
Chronic problem, pt is following w/ Dr Einar Gip.  Pt is on anticoagulation despite frequent falls.  Pt currently asymptomatic.  Will continue to follow.

## 2013-12-02 NOTE — Assessment & Plan Note (Signed)
Pt was told by GYN that she has osteoporosis.  Reviewed recent DEXA- BMD was in normal range.  Suspect she was given dx due to hx of compression fx.  Will follow.

## 2013-12-02 NOTE — Assessment & Plan Note (Signed)
Chronic problem.  Following w/ Dr Gwenette Greet.  Currently asymptomatic.

## 2013-12-02 NOTE — Progress Notes (Signed)
   Subjective:    Patient ID: [name], female    DOB: 07-29-1927, 81 y.o.   MRN: [id number]  HPI Here today for CPE.  Risk Factors: Parkinson's- pt is in denial of this, doesn't follow w/ neuro regularly (Dr Ian Bushman) as she is supposed to.  Now using a rolling walker w/ seat. GAD- ongoing issue for pt, is in therapy regularly  Afib- chronic problem, following w/ Dr Einar Gip, on Coumadin despite high risk for falls Osteoporosis- chronic problem, dx'd by GYN due to compression fx in spine, UTD on DEXA.  Not currently on meds. Physical Activity: exercising regularly, swims frequently Fall Risk: high, already has had multiple falls Depression: chronic problem, on Zoloft Hearing: decreased to conversational tones, whispered ADL's: independent Cognitive: normal linear thought process, memory and attention intact Home Safety: lives alone, feels safe Height, Weight, BMI, Visual Acuity: see vitals, vision corrected to 20/20 w/ glasses Counseling: UTD on mammo, DEXA, colonoscopy.  No need for paps. Labs Ordered: See A&P Care Plan: See A&P    Review of Systems Patient reports no vision/ hearing changes, adenopathy,fever, weight change,  persistant/recurrent hoarseness , swallowing issues, chest pain, palpitations, edema, persistant/recurrent cough, hemoptysis, gastrointestinal bleeding (melena, rectal bleeding), abdominal pain, significant heartburn, bowel changes, GU symptoms (dysuria, hematuria, incontinence), Gyn symptoms (abnormal  bleeding, pain),  syncope, focal weakness, memory loss, numbness & tingling, skin/hair/nail changes, abnormal bruising or bleeding.  + SOB but this is at baseline    Objective:   Physical Exam General Appearance:    Alert, cooperative, no distress, appears stated age  Head:    Normocephalic, without obvious abnormality, atraumatic  Eyes:    PERRL, conjunctiva/corneas clear, EOM's intact, fundi    benign, both eyes  Ears:    Hearing aides in place  bilaterally  Nose:   Nares normal, septum midline, mucosa normal, no drainage    or sinus tenderness  Throat:   Lips, mucosa, and tongue normal; teeth and gums normal  Neck:   Supple, symmetrical, trachea midline, no adenopathy;    Thyroid: no enlargement/tenderness/nodules  Back:     Symmetric, no curvature, ROM normal, no CVA tenderness  Lungs:     Clear to auscultation bilaterally, respirations unlabored  Chest Wall:    No tenderness or deformity   Heart:    S1 and S2 normal, no murmur, rub or gallop  Breast Exam:    Deferred to GYN  Abdomen:     Soft, non-tender, bowel sounds active all four quadrants,    no masses, no organomegaly  Genitalia:    Deferred to GYN  Rectal:    Extremities:   Extremities normal, atraumatic, no cyanosis or edema  Pulses:   2+ and symmetric all extremities  Skin:   Dry skin of feet bilaterally, resolving hematoma of L forehead  Lymph nodes:   Cervical, supraclavicular, and axillary nodes normal  Neurologic:   Pt w/ stooped posture, shuffling gait          Assessment & Plan:

## 2013-12-02 NOTE — Patient Instructions (Signed)
Follow up in 6 months to recheck mood and Parkinson's We'll notify you of your lab results and make any changes if needed We'll call you with your neuro appt for the Parkinson's Call with any questions or concerns Hang in there!  You're doing great!

## 2013-12-02 NOTE — Assessment & Plan Note (Signed)
Pt's PE is unchanged from previous.  UTD on mammo, DEXA, colonoscopy.  No need for paps.  Written screening schedule updated and given to pt.  Anticipatory guidance provided.

## 2013-12-02 NOTE — Assessment & Plan Note (Signed)
Chronic problem.  Pt is on Zoloft and seeing therapist w/ some improvement.  Continues to be difficult for pt.  Will follow.

## 2013-12-04 LAB — URINE CULTURE: Colony Count: 9000

## 2013-12-06 ENCOUNTER — Other Ambulatory Visit: Payer: Self-pay

## 2013-12-06 DIAGNOSIS — Z1231 Encounter for screening mammogram for malignant neoplasm of breast: Secondary | ICD-10-CM

## 2013-12-12 ENCOUNTER — Telehealth: Payer: Self-pay | Admitting: Family Medicine

## 2013-12-12 NOTE — Telephone Encounter (Signed)
Caller name: [place] Relation to pt: Call back number:[phone]   Reason for call:  Pt had MRI done on 8/3 and has not heard results. Would like those.  thanks

## 2013-12-12 NOTE — Telephone Encounter (Signed)
Result Notes    Notes Recorded by Rockwell Germany, CMA on 11/25/2013 at 3:17 PM Patient informed, understood/SLS [08.06.15]  ------  Notes Recorded by Brunetta Jeans, PA-C on 11/23/2013 at 3:11 PM MRI negative for concerning findings. Again, labs look good. Pain likely due to resolving scalp hematoma. Symptoms should begin to improve. As discussed at visit -- post-concussion symptoms can last for 3-4 weeks but should start to improve daily.   Please help patient if she calls back not understanding results once again, as noted above, she ws informed & stated she understood results on 08.06.15; LMOM with contact name and number [for return call, if needed] RE: results and further provider instructions/SLS Thanks!

## 2013-12-13 ENCOUNTER — Ambulatory Visit (INDEPENDENT_AMBULATORY_CARE_PROVIDER_SITE_OTHER): Payer: Medicare Other | Admitting: Psychiatry

## 2013-12-13 DIAGNOSIS — F411 Generalized anxiety disorder: Secondary | ICD-10-CM | POA: Diagnosis not present

## 2013-12-13 DIAGNOSIS — Z7901 Long term (current) use of anticoagulants: Secondary | ICD-10-CM | POA: Diagnosis not present

## 2013-12-14 ENCOUNTER — Telehealth: Payer: Self-pay | Admitting: Neurology

## 2013-12-14 NOTE — Telephone Encounter (Signed)
Pt called to cancel her NP appt with Dr. Carles Collet on 12/20/13 due to her finding another neurologist. Referring provider was notified via telephone.

## 2013-12-16 DIAGNOSIS — Z7901 Long term (current) use of anticoagulants: Secondary | ICD-10-CM | POA: Diagnosis not present

## 2013-12-20 ENCOUNTER — Ambulatory Visit: Payer: Self-pay | Admitting: Neurology

## 2013-12-27 ENCOUNTER — Ambulatory Visit (INDEPENDENT_AMBULATORY_CARE_PROVIDER_SITE_OTHER): Payer: Medicare Other | Admitting: Psychiatry

## 2013-12-27 DIAGNOSIS — F411 Generalized anxiety disorder: Secondary | ICD-10-CM

## 2013-12-27 DIAGNOSIS — Z7901 Long term (current) use of anticoagulants: Secondary | ICD-10-CM | POA: Diagnosis not present

## 2014-01-06 DIAGNOSIS — Z7901 Long term (current) use of anticoagulants: Secondary | ICD-10-CM | POA: Diagnosis not present

## 2014-01-09 DIAGNOSIS — Z23 Encounter for immunization: Secondary | ICD-10-CM | POA: Diagnosis not present

## 2014-01-10 ENCOUNTER — Ambulatory Visit
Admission: RE | Admit: 2014-01-10 | Discharge: 2014-01-10 | Disposition: A | Discharge status: Home / Self Care | Payer: Medicare Other | Source: Ambulatory Visit

## 2014-01-10 DIAGNOSIS — Z1231 Encounter for screening mammogram for malignant neoplasm of breast: Secondary | ICD-10-CM

## 2014-01-17 ENCOUNTER — Ambulatory Visit (INDEPENDENT_AMBULATORY_CARE_PROVIDER_SITE_OTHER): Payer: Medicare Other | Admitting: Psychiatry

## 2014-01-17 DIAGNOSIS — F411 Generalized anxiety disorder: Secondary | ICD-10-CM | POA: Diagnosis not present

## 2014-02-03 DIAGNOSIS — Z7901 Long term (current) use of anticoagulants: Secondary | ICD-10-CM | POA: Diagnosis not present

## 2014-02-08 ENCOUNTER — Ambulatory Visit (INDEPENDENT_AMBULATORY_CARE_PROVIDER_SITE_OTHER): Payer: Medicare Other | Admitting: Psychiatry

## 2014-02-08 DIAGNOSIS — F419 Anxiety disorder, unspecified: Secondary | ICD-10-CM

## 2014-02-11 ENCOUNTER — Encounter (HOSPITAL_COMMUNITY): Payer: Self-pay | Admitting: Emergency Medicine

## 2014-02-11 ENCOUNTER — Emergency Department (HOSPITAL_COMMUNITY): Payer: Medicare Other

## 2014-02-11 ENCOUNTER — Emergency Department (HOSPITAL_COMMUNITY)
Admission: EM | Admit: 2014-02-11 | Discharge: 2014-02-12 | Disposition: A | Discharge status: Home / Self Care | Payer: Medicare Other | Attending: Emergency Medicine | Admitting: Emergency Medicine

## 2014-02-11 DIAGNOSIS — Z8719 Personal history of other diseases of the digestive system: Secondary | ICD-10-CM | POA: Diagnosis not present

## 2014-02-11 DIAGNOSIS — Z8619 Personal history of other infectious and parasitic diseases: Secondary | ICD-10-CM | POA: Insufficient documentation

## 2014-02-11 DIAGNOSIS — F329 Major depressive disorder, single episode, unspecified: Secondary | ICD-10-CM | POA: Insufficient documentation

## 2014-02-11 DIAGNOSIS — Z86010 Personal history of colonic polyps: Secondary | ICD-10-CM | POA: Diagnosis not present

## 2014-02-11 DIAGNOSIS — R1110 Vomiting, unspecified: Secondary | ICD-10-CM | POA: Diagnosis not present

## 2014-02-11 DIAGNOSIS — R079 Chest pain, unspecified: Secondary | ICD-10-CM | POA: Diagnosis present

## 2014-02-11 DIAGNOSIS — R0602 Shortness of breath: Secondary | ICD-10-CM

## 2014-02-11 DIAGNOSIS — Z791 Long term (current) use of non-steroidal anti-inflammatories (NSAID): Secondary | ICD-10-CM | POA: Insufficient documentation

## 2014-02-11 DIAGNOSIS — Z79899 Other long term (current) drug therapy: Secondary | ICD-10-CM | POA: Diagnosis not present

## 2014-02-11 DIAGNOSIS — M1990 Unspecified osteoarthritis, unspecified site: Secondary | ICD-10-CM | POA: Insufficient documentation

## 2014-02-11 DIAGNOSIS — F419 Anxiety disorder, unspecified: Secondary | ICD-10-CM | POA: Diagnosis not present

## 2014-02-11 DIAGNOSIS — R6883 Chills (without fever): Secondary | ICD-10-CM | POA: Diagnosis not present

## 2014-02-11 DIAGNOSIS — R0789 Other chest pain: Secondary | ICD-10-CM | POA: Insufficient documentation

## 2014-02-11 DIAGNOSIS — Z8639 Personal history of other endocrine, nutritional and metabolic disease: Secondary | ICD-10-CM | POA: Insufficient documentation

## 2014-02-11 DIAGNOSIS — Z7901 Long term (current) use of anticoagulants: Secondary | ICD-10-CM | POA: Diagnosis not present

## 2014-02-11 DIAGNOSIS — Z880 Allergy status to penicillin: Secondary | ICD-10-CM | POA: Diagnosis not present

## 2014-02-11 DIAGNOSIS — Z87440 Personal history of urinary (tract) infections: Secondary | ICD-10-CM | POA: Insufficient documentation

## 2014-02-11 DIAGNOSIS — R05 Cough: Secondary | ICD-10-CM | POA: Insufficient documentation

## 2014-02-11 DIAGNOSIS — I4891 Unspecified atrial fibrillation: Secondary | ICD-10-CM | POA: Diagnosis not present

## 2014-02-11 DIAGNOSIS — Z87891 Personal history of nicotine dependence: Secondary | ICD-10-CM | POA: Diagnosis not present

## 2014-02-11 DIAGNOSIS — J449 Chronic obstructive pulmonary disease, unspecified: Secondary | ICD-10-CM | POA: Diagnosis not present

## 2014-02-11 LAB — COMPREHENSIVE METABOLIC PANEL
ALT: 11 U/L (ref 0–35)
AST: 20 U/L (ref 0–37)
Albumin: 3.5 g/dL (ref 3.5–5.2)
Alkaline Phosphatase: 103 U/L (ref 39–117)
Anion gap: 9 (ref 5–15)
BUN: 14 mg/dL (ref 6–23)
CALCIUM: 9.6 mg/dL (ref 8.4–10.5)
CO2: 27 mEq/L (ref 19–32)
Chloride: 99 mEq/L (ref 96–112)
Creatinine, Ser: 0.89 mg/dL (ref 0.50–1.10)
GFR calc Af Amer: 67 mL/min — ABNORMAL LOW (ref >90)
GFR calc non Af Amer: 58 mL/min — ABNORMAL LOW (ref >90)
GLUCOSE: 94 mg/dL (ref 70–99)
Potassium: 4.3 mEq/L (ref 3.7–5.3)
SODIUM: 135 meq/L — AB (ref 137–147)
TOTAL PROTEIN: 7.1 g/dL (ref 6.0–8.3)
Total Bilirubin: <0.2 mg/dL — ABNORMAL LOW (ref 0.3–1.2)

## 2014-02-11 LAB — I-STAT TROPONIN, ED: TROPONIN I, POC: 0.01 ng/mL (ref 0.00–0.08)

## 2014-02-11 LAB — CBC WITH DIFFERENTIAL/PLATELET
Basophils Absolute: 0 10*3/uL (ref 0.0–0.1)
Basophils Relative: 0 % (ref 0–1)
EOS ABS: 0.1 10*3/uL (ref 0.0–0.7)
Eosinophils Relative: 2 % (ref 0–5)
HCT: 35.2 % — ABNORMAL LOW (ref 36.0–46.0)
HEMOGLOBIN: 11.7 g/dL — AB (ref 12.0–15.0)
LYMPHS ABS: 2.1 10*3/uL (ref 0.7–4.0)
Lymphocytes Relative: 32 % (ref 12–46)
MCH: 31 pg (ref 26.0–34.0)
MCHC: 33.2 g/dL (ref 30.0–36.0)
MCV: 93.1 fL (ref 78.0–100.0)
MONOS PCT: 11 % (ref 3–12)
Monocytes Absolute: 0.7 10*3/uL (ref 0.1–1.0)
NEUTROS ABS: 3.6 10*3/uL (ref 1.7–7.7)
Neutrophils Relative %: 55 % (ref 43–77)
Platelets: 234 10*3/uL (ref 150–400)
RBC: 3.78 MIL/uL — AB (ref 3.87–5.11)
RDW: 13.6 % (ref 11.5–15.5)
WBC: 6.5 10*3/uL (ref 4.0–10.5)

## 2014-02-11 LAB — LIPASE, BLOOD: Lipase: 60 U/L — ABNORMAL HIGH (ref 11–59)

## 2014-02-11 LAB — PROTIME-INR
INR: 4.15 — ABNORMAL HIGH (ref 0.00–1.49)
Prothrombin Time: 40.4 seconds — ABNORMAL HIGH (ref 11.6–15.2)

## 2014-02-11 LAB — TROPONIN I

## 2014-02-11 MED ORDER — ASPIRIN 325 MG PO TABS
325.0000 mg | ORAL_TABLET | ORAL | Status: AC
  Administered 2014-02-11: 325 mg via ORAL
  Filled 2014-02-11: qty 1

## 2014-02-11 MED ORDER — NITROGLYCERIN 0.3 MG SL SUBL: 0.3000 mg | SUBLINGUAL_TABLET | SUBLINGUAL | Status: DC | PRN

## 2014-02-11 MED ORDER — NITROGLYCERIN 0.3 MG SL SUBL
0.3000 mg | SUBLINGUAL_TABLET | SUBLINGUAL | Status: DC | PRN
  Administered 2014-02-11: 0.3 mg via SUBLINGUAL
  Filled 2014-02-11: qty 100

## 2014-02-11 MED ORDER — NITROGLYCERIN 0.4 MG SL SUBL: 0.4000 mg | SUBLINGUAL_TABLET | SUBLINGUAL | Status: DC | PRN

## 2014-02-11 NOTE — ED Notes (Signed)
C/o L CP, onset yesterday at 1300, onset prior to eating, also mentions intermittant sob, non-productive cough, chills and nausea (denies: dizziness, vd or fever), "unsure if indigestion", describes as intermittant, "woke this am feeling fine". No meds PTA. Takes coumadin. H/o afib, NSR at this time HR 67.

## 2014-02-11 NOTE — ED Provider Notes (Signed)
CSN: [id number]     Arrival date & time 02/11/14  1912 History   First MD Initiated Contact with Patient 02/11/14 1945     Chief Complaint  Patient presents with  . Chest Pain  . Chills  . Shortness of Breath  . Cough     (Consider location/radiation/quality/duration/timing/severity/associated sxs/prior Treatment) Patient is a 80 y.o. female presenting with chest pain, shortness of breath, and cough. The history is provided by the patient.  Chest Pain Pain location:  L chest Pain quality comment:  Heaviness Pain radiates to:  L shoulder Pain radiates to the back: no   Pain severity:  Mild Onset quality:  Sudden Timing:  Intermittent Progression:  Partially resolved Chronicity:  New Context: movement   Relieved by:  Nothing Worsened by:  Nothing tried Ineffective treatments:  None tried Associated symptoms: cough   Associated symptoms: no abdominal pain, no back pain, no dizziness, no fatigue, no fever, no headache, no nausea, no shortness of breath and not vomiting   Shortness of Breath Associated symptoms: chest pain and cough   Associated symptoms: no abdominal pain, no fever, no headaches, no neck pain and no vomiting   Cough Associated symptoms: chest pain and chills   Associated symptoms: no fever, no headaches and no shortness of breath     Past Medical History  Diagnosis Date  . Arthritis   . Hyperlipidemia   . 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 Chronic constipation   . Hemorrhoid   . Varicose veins   . Anxiety   . Depression   . GERD (gastroesophageal reflux disease)   . Atrial fibrillation   . Recurrent UTI   . Parkinson disease   . Cardiac arrhythmia due to congenital heart disease   . Mycotic toenails 10/27/2012   Past Surgical History  Procedure Laterality Date  . Appendectomy    . Ileal resection  03/2007    Due to obstruction   Family History  Problem Relation Age of Onset  . Asthma Brother   . Alcohol abuse Brother   . Throat  cancer Father   . Alcohol abuse Father   . Lupus Daughter   . Bipolar disorder Daughter   . Lupus Daughter   . Anxiety disorder Daughter   . Alcohol abuse Sister   . Alcohol abuse Maternal Grandfather   . Alcohol abuse Paternal Grandmother    History  Substance Use Topics  . Smoking status: Former Smoker -- 2.00 packs/day for 30 years    Types: Cigarettes    Quit date: 04/21/1980  . Smokeless tobacco: Never Used     Comment: onset age 70 -89, up to > 1ppd (almost 2 ppd)  . Alcohol Use: No   OB History   Grav Para Term Preterm Abortions TAB SAB Ect Mult Living                 Review of Systems  Constitutional: Positive for chills. Negative for fever and fatigue.  HENT: Negative for congestion and drooling.   Eyes: Negative for pain.  Respiratory: Positive for cough. Negative for shortness of breath.   Cardiovascular: Positive for chest pain.  Gastrointestinal: Negative for nausea, vomiting, abdominal pain and diarrhea.  Genitourinary: Negative for dysuria and hematuria.  Musculoskeletal: Negative for back pain, gait problem and neck pain.  Skin: Negative for color change.  Neurological: Negative for dizziness and headaches.  Hematological: Negative for adenopathy.  Psychiatric/Behavioral: Negative for behavioral problems.  All other systems reviewed and are negative.  Allergies  Adhesive; Amoxicillin; Ciprofloxacin; and Ultram  Home Medications   Prior to Admission medications   Medication Sig Start Date End Date Taking? Authorizing Provider  acetaminophen (TYLENOL) 500 MG tablet Take 1,000 mg by mouth 3 (three) times daily as needed (pain).   Yes Historical Provider, MD  albuterol (VENTOLIN HFA) 108 (90 BASE) MCG/ACT inhaler Inhale 2 puffs into the lungs 2 (two) times daily as needed for wheezing or shortness of breath.    Yes Historical Provider, MD  Ascorbic Acid (VITAMIN C) 1000 MG tablet Take 1,000 mg by mouth daily.   Yes Historical Provider, MD  Calcium  Carb-Cholecalciferol (CALCIUM 1000 + D PO) Take 1,000 mg by mouth daily.   Yes Historical Provider, MD  celecoxib (CELEBREX) 100 MG capsule Take 100 mg by mouth daily as needed (back pain).   Yes Historical Provider, MD  Cranberry 500 MG CAPS Take 500 mg by mouth daily.   Yes Historical Provider, MD  Omega-3 Fatty Acids (FISH OIL) 500 MG CAPS Take 500 mg by mouth daily.   Yes Historical Provider, MD  OVER THE COUNTER MEDICATION Place 1 drop into both eyes 3 (three) times daily as needed (dry eyes). Over the counter eye drops   Yes Historical Provider, MD  sertraline (ZOLOFT) 100 MG tablet Take 100 mg by mouth daily.  06/03/13  Yes Midge Minium, MD  trimethoprim (TRIMPEX) 100 MG tablet Take 100 mg by mouth at bedtime. scheduled 01/17/14  Yes Historical Provider, MD  vitamin B-12 (CYANOCOBALAMIN) 100 MCG tablet Take 100 mcg by mouth daily.   Yes Historical Provider, MD  vitamin B-12 (CYANOCOBALAMIN) 500 MCG tablet Take 500 mcg by mouth daily.   Yes Historical Provider, MD  warfarin (COUMADIN) 2.5 MG tablet Take 2.5-5 mg by mouth at bedtime. Take 1 tablet (2.5 mg) on Tuesdays, take 2 tablets (5 mg) on Sunday, Monday, Wednesday, Thursday, Friday and Saturday   Yes Historical Provider, MD   BP 103/76  Pulse 70  Temp(Src) 97.9 F (36.6 C)  Resp 16  SpO2 95% Physical Exam  Nursing note and vitals reviewed. Constitutional: She is oriented to person, place, and time. She appears well-developed and well-nourished.  HENT:  Head: Normocephalic and atraumatic.  Mouth/Throat: Oropharynx is clear and moist. No oropharyngeal exudate.  Eyes: Conjunctivae and EOM are normal. Pupils are equal, round, and reactive to light.  Neck: Normal range of motion. Neck supple.  Cardiovascular: Normal rate, regular rhythm, normal heart sounds and intact distal pulses.  Exam reveals no gallop and no friction rub.   No murmur heard. Pulmonary/Chest: Effort normal and breath sounds normal. No respiratory distress. She  has no wheezes.  Abdominal: Soft. Bowel sounds are normal. There is no tenderness. There is no rebound and no guarding.  Musculoskeletal: Normal range of motion. She exhibits no edema and no tenderness.  Neurological: She is alert and oriented to person, place, and time.  Skin: Skin is warm and dry.  Psychiatric: She has a normal mood and affect. Her behavior is normal.    ED Course  Procedures (including critical care time) Labs Review Labs Reviewed  COMPREHENSIVE METABOLIC PANEL - Abnormal; Notable for the following:    Sodium 135 (*)    Total Bilirubin <0.2 (*)    GFR calc non Af Amer 58 (*)    GFR calc Af Amer 67 (*)    All other components within normal limits  CBC WITH DIFFERENTIAL - Abnormal; Notable for the following:    RBC 3.78 (*)  Hemoglobin 11.7 (*)    HCT 35.2 (*)    All other components within normal limits  LIPASE, BLOOD - Abnormal; Notable for the following:    Lipase 60 (*)    All other components within normal limits  PROTIME-INR - Abnormal; Notable for the following:    Prothrombin Time 40.4 (*)    INR 4.15 (*)    All other components within normal limits  I-STAT TROPOININ, ED    Imaging Review Dg Chest 2 View  02/11/2014   CLINICAL DATA:  Chest tightness.  EXAM: CHEST  2 VIEW  COMPARISON:  01/06/2013  FINDINGS: There is hyperinflation of the lungs compatible with COPD. Bibasilar scarring. Heart is normal size. No acute airspace opacities or effusions. No acute bony abnormality.  IMPRESSION: COPD, bibasilar scarring.  No active disease.   Electronically Signed   By: Rolm Baptise M.D.   On: 02/11/2014 19:49     EKG Interpretation   Date/Time:  Saturday February 11 2014 20:06:42 EDT Ventricular Rate:  57 PR Interval:  59 QRS Duration: 78 QT Interval:  446 QTC Calculation: 434 R Axis:   63 Text Interpretation:  Sinus rhythm Short PR interval No significant change  since last tracing Confirmed by Dodd Schmid  MD, Elloise Roark ([id number]) on 02/11/2014  8:17:31  PM      MDM   Final diagnoses:  Other chest pain    8:38 PM 80 y.o. female w hx of HLP, afib on coumadin, previous smoker, who presents with left-sided chest heaviness which began yesterday while she was having a busy day around 1 PM. She states that her pain lasted for about 6 hours and then slowly dissipated. She states that the pain returned again today around 1 PM while pulling weeds in her garden. She states it has been constant ever since and is currently a 3 out of 10. She is afebrile and vital signs are unremarkable here.workup is thus far noncontributory. We'll give her an aspirin and a nitroglycerin to see if it relieves her pain.  Case discussed w Dr. Eustace Pen who knows the pt well. He recommends d/c and close f/u w/ him next week. The pt is currently asx after 1 nitro. Neg delta trop.    Pt continues to appear well.  I have discussed the diagnosis/risks/treatment options with the patient and friend and believe the pt to be eligible for discharge home to follow-up with Dr. Eustace Pen early next week. We also discussed returning to the ED immediately if new or worsening sx occur. We discussed the sx which are most concerning (e.g., sob, cp, vomiting, nausea, diaphoresis) that necessitate immediate return. Medications administered to the patient during their visit and any new prescriptions provided to the patient are listed below.  Medications given during this visit Medications  aspirin tablet 325 mg (325 mg Oral Given 02/11/14 2044)    Discharge Medication List as of 02/11/2014 11:29 PM         Pamella Pert, MD 02/12/14 1027

## 2014-02-11 NOTE — Discharge Instructions (Signed)

## 2014-02-13 DIAGNOSIS — Z7901 Long term (current) use of anticoagulants: Secondary | ICD-10-CM | POA: Diagnosis not present

## 2014-02-13 DIAGNOSIS — R0789 Other chest pain: Secondary | ICD-10-CM | POA: Diagnosis not present

## 2014-02-13 DIAGNOSIS — I480 Paroxysmal atrial fibrillation: Secondary | ICD-10-CM | POA: Diagnosis not present

## 2014-02-16 ENCOUNTER — Encounter: Payer: Self-pay | Admitting: Podiatrist

## 2014-02-16 ENCOUNTER — Ambulatory Visit (INDEPENDENT_AMBULATORY_CARE_PROVIDER_SITE_OTHER): Payer: Medicare Other | Admitting: Podiatrist

## 2014-02-16 DIAGNOSIS — M79676 Pain in unspecified toe(s): Secondary | ICD-10-CM

## 2014-02-16 DIAGNOSIS — B351 Tinea unguium: Secondary | ICD-10-CM

## 2014-02-20 NOTE — Progress Notes (Signed)
HPI: Patient presents today for follow up of foot and nail care.she sustained a head injury a couple weeks ago at the [place]. She fell as she was leaving the theater and hit her head on concrete. She states her she still has a little headache and floaters but is otherwise ok. She is still on coumadin.  Objective: Patients chart is reviewed. Vascular status reveals pedal pulses noted at 2 out of 4 dp and pt bilateral . Neurological sensation is Normal to [employer] monofilament bilateral. Patients nails are thickened, discolored, distrophic, friable and brittle with yellow-brown discoloration. Patient subjectively relates they are painful with shoes and with ambulation of bilateral feet.   Assessment: Symptomatic onychomycosis   Plan: Discussed treatment options and alternatives. The symptomatic toenails were debrided through manual an mechanical means without complication. Return appointment recommended at routine intervals of 3 months

## 2014-02-24 DIAGNOSIS — Z7901 Long term (current) use of anticoagulants: Secondary | ICD-10-CM | POA: Diagnosis not present

## 2014-03-01 ENCOUNTER — Ambulatory Visit (INDEPENDENT_AMBULATORY_CARE_PROVIDER_SITE_OTHER): Payer: Medicare Other | Admitting: Psychiatry

## 2014-03-01 DIAGNOSIS — F419 Anxiety disorder, unspecified: Secondary | ICD-10-CM | POA: Diagnosis not present

## 2014-03-07 ENCOUNTER — Ambulatory Visit (INDEPENDENT_AMBULATORY_CARE_PROVIDER_SITE_OTHER): Payer: Medicare Other | Admitting: Family Medicine

## 2014-03-07 ENCOUNTER — Other Ambulatory Visit (HOSPITAL_COMMUNITY)
Admission: RE | Admit: 2014-03-07 | Discharge: 2014-03-07 | Disposition: A | Discharge status: Home / Self Care | Payer: Medicare Other | Source: Ambulatory Visit | Attending: Family Medicine | Admitting: Family Medicine

## 2014-03-07 ENCOUNTER — Encounter: Payer: Self-pay | Admitting: Family Medicine

## 2014-03-07 VITALS — BP 128/88 | HR 88 | Temp 97.9°F | Resp 16 | Wt 159.5 lb

## 2014-03-07 DIAGNOSIS — N760 Acute vaginitis: Secondary | ICD-10-CM

## 2014-03-07 DIAGNOSIS — Z23 Encounter for immunization: Secondary | ICD-10-CM | POA: Diagnosis not present

## 2014-03-07 DIAGNOSIS — R140 Abdominal distension (gaseous): Secondary | ICD-10-CM | POA: Diagnosis not present

## 2014-03-07 NOTE — Assessment & Plan Note (Signed)
Pt w/ scant vaginal d/c but it is malodorous.  Suspect BV but will await wet prep results prior to starting tx.  Pt expressed understanding and is in agreement w/ plan.

## 2014-03-07 NOTE — Patient Instructions (Signed)
Follow up as needed We'll notify you of your vaginal lab results and treat as needed Continue your protonix daily to decrease your reflux Add OTC Gas-X to decrease gas and bloating Call with any questions or concerns Happy Thanksgiving!

## 2014-03-07 NOTE — Assessment & Plan Note (Signed)
New.  Encouraged pt to resume PPI daily to prevent increased GERD and belching.  Pt to add OTC Gas-X to decrease abdominal bloating and flatulence.  Reviewed supportive care and red flags that should prompt return.  Pt expressed understanding and is in agreement w/ plan.

## 2014-03-07 NOTE — Progress Notes (Signed)
Pre visit review using our clinic review tool, if applicable. No additional management support is needed unless otherwise documented below in the visit note. 

## 2014-03-07 NOTE — Progress Notes (Signed)
   Subjective:    Patient ID: [name], female    DOB: December 26, 1927, 80 y.o.   MRN: [id number]  HPI Pelvic pain- pt reports hx of pelvic cramping for over 20 yrs.  Had GYN evaluation and 'there was nothing wrong'.  Still sees GYN Nori Riis) regularly.  Pt reports increased gas recently- some relief w/ Protonix (does not take regularly) and Tums.  Pt reports 'very smelly discharge' vaginally.  Denies urinary frequency, dysuria.  D/c started 'a couple of weeks ago'.     Review of Systems For ROS see HPI     Objective:   Physical Exam  Constitutional: She appears well-developed and well-nourished. No distress.  Abdominal: Soft. Bowel sounds are normal. She exhibits no distension. There is no tenderness. There is no rebound and no guarding.  Genitourinary: Vaginal discharge (scant, thin, malodorous d/c- wet prep collected) found.  Psychiatric:  anxious  Vitals reviewed.         Assessment & Plan:

## 2014-03-08 LAB — CERVICOVAGINAL ANCILLARY ONLY
Wet Prep (BD Affirm): NEGATIVE
Wet Prep (BD Affirm): NEGATIVE
Wet Prep (BD Affirm): NEGATIVE

## 2014-03-10 DIAGNOSIS — Z7901 Long term (current) use of anticoagulants: Secondary | ICD-10-CM | POA: Diagnosis not present

## 2014-03-13 ENCOUNTER — Telehealth: Payer: Self-pay | Admitting: Family Medicine

## 2014-03-13 NOTE — Telephone Encounter (Signed)
Pt notified that results were released to mychart and also given the results.

## 2014-03-13 NOTE — Telephone Encounter (Signed)
Please advise. These look like they were released with no documentation

## 2014-03-13 NOTE — Telephone Encounter (Signed)
Pt would like to know if her results are back from her labs that was done on 03/07/14.

## 2014-03-13 NOTE — Telephone Encounter (Signed)
Labs were released via MyChart saying 'no evidence of yeast or BV- good news!'  Pt has not yet viewed results

## 2014-03-15 ENCOUNTER — Ambulatory Visit (INDEPENDENT_AMBULATORY_CARE_PROVIDER_SITE_OTHER): Payer: Medicare Other | Admitting: Psychiatry

## 2014-03-15 DIAGNOSIS — Z7901 Long term (current) use of anticoagulants: Secondary | ICD-10-CM | POA: Diagnosis not present

## 2014-03-15 DIAGNOSIS — F419 Anxiety disorder, unspecified: Secondary | ICD-10-CM | POA: Diagnosis not present

## 2014-03-29 ENCOUNTER — Ambulatory Visit: Payer: Medicare Other | Admitting: Psychiatry

## 2014-04-03 DIAGNOSIS — H04123 Dry eye syndrome of bilateral lacrimal glands: Secondary | ICD-10-CM | POA: Diagnosis not present

## 2014-04-03 DIAGNOSIS — H26491 Other secondary cataract, right eye: Secondary | ICD-10-CM | POA: Diagnosis not present

## 2014-04-03 DIAGNOSIS — H524 Presbyopia: Secondary | ICD-10-CM | POA: Diagnosis not present

## 2014-04-03 DIAGNOSIS — H35372 Puckering of macula, left eye: Secondary | ICD-10-CM | POA: Diagnosis not present

## 2014-04-06 DIAGNOSIS — L853 Xerosis cutis: Secondary | ICD-10-CM | POA: Diagnosis not present

## 2014-04-06 DIAGNOSIS — Z85828 Personal history of other malignant neoplasm of skin: Secondary | ICD-10-CM | POA: Diagnosis not present

## 2014-04-06 DIAGNOSIS — L821 Other seborrheic keratosis: Secondary | ICD-10-CM | POA: Diagnosis not present

## 2014-04-06 DIAGNOSIS — L820 Inflamed seborrheic keratosis: Secondary | ICD-10-CM | POA: Diagnosis not present

## 2014-04-06 DIAGNOSIS — L304 Erythema intertrigo: Secondary | ICD-10-CM | POA: Diagnosis not present

## 2014-04-18 ENCOUNTER — Ambulatory Visit (INDEPENDENT_AMBULATORY_CARE_PROVIDER_SITE_OTHER): Payer: Medicare Other | Admitting: Psychiatry

## 2014-04-18 DIAGNOSIS — F419 Anxiety disorder, unspecified: Secondary | ICD-10-CM | POA: Diagnosis not present

## 2014-04-18 DIAGNOSIS — H04123 Dry eye syndrome of bilateral lacrimal glands: Secondary | ICD-10-CM | POA: Diagnosis not present

## 2014-05-10 ENCOUNTER — Ambulatory Visit (INDEPENDENT_AMBULATORY_CARE_PROVIDER_SITE_OTHER): Payer: Medicare Other | Admitting: Psychiatry

## 2014-05-10 DIAGNOSIS — F419 Anxiety disorder, unspecified: Secondary | ICD-10-CM

## 2014-05-11 ENCOUNTER — Ambulatory Visit: Payer: Medicare Other | Admitting: Podiatrist

## 2014-05-18 ENCOUNTER — Ambulatory Visit: Payer: Medicare Other | Admitting: Podiatrist

## 2014-05-23 ENCOUNTER — Ambulatory Visit (INDEPENDENT_AMBULATORY_CARE_PROVIDER_SITE_OTHER): Payer: Medicare Other | Admitting: Psychiatry

## 2014-05-23 DIAGNOSIS — F419 Anxiety disorder, unspecified: Secondary | ICD-10-CM

## 2014-06-02 ENCOUNTER — Other Ambulatory Visit: Payer: Self-pay | Admitting: Family Medicine

## 2014-06-02 NOTE — Telephone Encounter (Signed)
Med filled.  

## 2014-06-11 ENCOUNTER — Encounter (HOSPITAL_COMMUNITY): Payer: Self-pay | Admitting: Emergency Medicine

## 2014-06-11 ENCOUNTER — Emergency Department (HOSPITAL_COMMUNITY)
Admission: EM | Admit: 2014-06-11 | Discharge: 2014-06-11 | Disposition: A | Discharge status: Home / Self Care | Payer: Medicare Other | Attending: Emergency Medicine | Admitting: Emergency Medicine

## 2014-06-11 ENCOUNTER — Ambulatory Visit (INDEPENDENT_AMBULATORY_CARE_PROVIDER_SITE_OTHER): Payer: Medicare Other | Admitting: Emergency Medicine

## 2014-06-11 VITALS — BP 102/63 | HR 76 | Temp 98.1°F | Resp 18

## 2014-06-11 DIAGNOSIS — Z880 Allergy status to penicillin: Secondary | ICD-10-CM | POA: Insufficient documentation

## 2014-06-11 DIAGNOSIS — R42 Dizziness and giddiness: Secondary | ICD-10-CM | POA: Diagnosis not present

## 2014-06-11 DIAGNOSIS — Z8679 Personal history of other diseases of the circulatory system: Secondary | ICD-10-CM | POA: Diagnosis not present

## 2014-06-11 DIAGNOSIS — Z79899 Other long term (current) drug therapy: Secondary | ICD-10-CM | POA: Insufficient documentation

## 2014-06-11 DIAGNOSIS — J449 Chronic obstructive pulmonary disease, unspecified: Secondary | ICD-10-CM

## 2014-06-11 DIAGNOSIS — R270 Ataxia, unspecified: Secondary | ICD-10-CM | POA: Diagnosis not present

## 2014-06-11 DIAGNOSIS — Z87891 Personal history of nicotine dependence: Secondary | ICD-10-CM | POA: Diagnosis not present

## 2014-06-11 DIAGNOSIS — Z8719 Personal history of other diseases of the digestive system: Secondary | ICD-10-CM | POA: Diagnosis not present

## 2014-06-11 DIAGNOSIS — G20 Parkinson's disease: Secondary | ICD-10-CM | POA: Insufficient documentation

## 2014-06-11 DIAGNOSIS — I4891 Unspecified atrial fibrillation: Secondary | ICD-10-CM

## 2014-06-11 DIAGNOSIS — Z7901 Long term (current) use of anticoagulants: Secondary | ICD-10-CM | POA: Diagnosis not present

## 2014-06-11 DIAGNOSIS — Z8659 Personal history of other mental and behavioral disorders: Secondary | ICD-10-CM | POA: Insufficient documentation

## 2014-06-11 DIAGNOSIS — Z86010 Personal history of colonic polyps: Secondary | ICD-10-CM | POA: Diagnosis not present

## 2014-06-11 DIAGNOSIS — N390 Urinary tract infection, site not specified: Secondary | ICD-10-CM | POA: Insufficient documentation

## 2014-06-11 DIAGNOSIS — Z791 Long term (current) use of non-steroidal anti-inflammatories (NSAID): Secondary | ICD-10-CM | POA: Diagnosis not present

## 2014-06-11 DIAGNOSIS — M1990 Unspecified osteoarthritis, unspecified site: Secondary | ICD-10-CM | POA: Diagnosis not present

## 2014-06-11 DIAGNOSIS — F411 Generalized anxiety disorder: Secondary | ICD-10-CM | POA: Diagnosis not present

## 2014-06-11 DIAGNOSIS — Z8774 Personal history of (corrected) congenital malformations of heart and circulatory system: Secondary | ICD-10-CM | POA: Diagnosis not present

## 2014-06-11 DIAGNOSIS — R404 Transient alteration of awareness: Secondary | ICD-10-CM | POA: Diagnosis not present

## 2014-06-11 DIAGNOSIS — B9689 Other specified bacterial agents as the cause of diseases classified elsewhere: Secondary | ICD-10-CM | POA: Diagnosis not present

## 2014-06-11 DIAGNOSIS — Z602 Problems related to living alone: Secondary | ICD-10-CM

## 2014-06-11 LAB — CBC WITH DIFFERENTIAL/PLATELET
BASOS ABS: 0 10*3/uL (ref 0.0–0.1)
Basophils Relative: 1 % (ref 0–1)
EOS PCT: 2 % (ref 0–5)
Eosinophils Absolute: 0.1 10*3/uL (ref 0.0–0.7)
HCT: 36.8 % (ref 36.0–46.0)
HEMOGLOBIN: 11.9 g/dL — AB (ref 12.0–15.0)
Lymphocytes Relative: 34 % (ref 12–46)
Lymphs Abs: 2 10*3/uL (ref 0.7–4.0)
MCH: 29.9 pg (ref 26.0–34.0)
MCHC: 32.3 g/dL (ref 30.0–36.0)
MCV: 92.5 fL (ref 78.0–100.0)
Monocytes Absolute: 0.5 10*3/uL (ref 0.1–1.0)
Monocytes Relative: 9 % (ref 3–12)
Neutro Abs: 3.2 10*3/uL (ref 1.7–7.7)
Neutrophils Relative %: 54 % (ref 43–77)
Platelets: 200 10*3/uL (ref 150–400)
RBC: 3.98 MIL/uL (ref 3.87–5.11)
RDW: 14.1 % (ref 11.5–15.5)
WBC: 5.8 10*3/uL (ref 4.0–10.5)

## 2014-06-11 LAB — BASIC METABOLIC PANEL
ANION GAP: 4 — AB (ref 5–15)
BUN: 10 mg/dL (ref 6–23)
CALCIUM: 9.4 mg/dL (ref 8.4–10.5)
CO2: 29 mmol/L (ref 19–32)
Chloride: 107 mmol/L (ref 96–112)
Creatinine, Ser: 0.88 mg/dL (ref 0.50–1.10)
GFR calc Af Amer: 68 mL/min — ABNORMAL LOW (ref >90)
GFR calc non Af Amer: 59 mL/min — ABNORMAL LOW (ref >90)
Glucose, Bld: 69 mg/dL — ABNORMAL LOW (ref 70–99)
Potassium: 3.6 mmol/L (ref 3.5–5.1)
Sodium: 140 mmol/L (ref 135–145)

## 2014-06-11 LAB — URINALYSIS, ROUTINE W REFLEX MICROSCOPIC
BILIRUBIN URINE: NEGATIVE
Glucose, UA: NEGATIVE mg/dL
Hgb urine dipstick: NEGATIVE
KETONES UR: NEGATIVE mg/dL
NITRITE: NEGATIVE
Protein, ur: NEGATIVE mg/dL
Specific Gravity, Urine: 1.008 (ref 1.005–1.030)
Urobilinogen, UA: 0.2 mg/dL (ref 0.0–1.0)
pH: 6 (ref 5.0–8.0)

## 2014-06-11 LAB — URINE MICROSCOPIC-ADD ON

## 2014-06-11 LAB — I-STAT TROPONIN, ED: Troponin i, poc: 0.01 ng/mL (ref 0.00–0.08)

## 2014-06-11 MED ORDER — NITROFURANTOIN MONOHYD MACRO 100 MG PO CAPS: 100.0000 mg | ORAL_CAPSULE | Freq: Two times a day (BID) | ORAL | Status: DC

## 2014-06-11 MED ORDER — MECLIZINE HCL 25 MG PO TABS
25.0000 mg | ORAL_TABLET | Freq: Once | ORAL | Status: DC
  Filled 2014-06-11: qty 1

## 2014-06-11 MED ORDER — SODIUM CHLORIDE 0.9 % IV BOLUS (SEPSIS)
500.0000 mL | Freq: Once | INTRAVENOUS | Status: AC
  Administered 2014-06-11: 500 mL via INTRAVENOUS

## 2014-06-11 NOTE — ED Notes (Addendum)
Pt to ED via [facility] from [facility] for further evaluation of dizziness and frontal head pressure for the past 2 days.  Pt has a hx of vertigo and reports that this feeling is different- pt admits to feeling weak upon standing several times in the past 2 days.  Pt is alert and oriented X 4 at present, orthostatic VS were negative, neuro exam normal.

## 2014-06-11 NOTE — ED Notes (Signed)
EDP states pt can take Meclizine (25mg ) from home.

## 2014-06-11 NOTE — Progress Notes (Signed)
[facility] [address] [place] [address] [phone]  Date:  06/11/2014   Name:  [name]   DOB:  1927-07-12   MRN:  [id number]  PCP:  Annye Asa, MD    Chief Complaint: Dizziness; Shortness of Breath; and Leg Pain   History of Present Illness:  [name] is a 81 y.o. very pleasant female patient who presents with the following:  Patient with parkinsons, COPD, and AF.  On Elliquis recently for AF  Lives alone She has begun to become dizzy when standing since yesterday Denies headache, falls or head injury. No headache No other acute neuro or visual symptoms Denies chest pain, nausea or vomiting.  Some exertional shortness of breath  Appetite diminished.   No fever or chills.   No stool change Had prior BPV and treated with antivert.  She was interested in taking the med but afraid of interaction with eliquis. No improvement with over the counter medications or other home remedies. Denies other complaint or health concern today. No improvement with over the counter medications or other home remedies. Denies other complaint or health concern today.    Patient Active Problem List   Diagnosis Date Noted  . Flatulence/gas pain/belching 03/07/2014  . Concussion 11/21/2013  . Fall from other slipping, tripping, or stumbling 11/17/2013  . Left parietal scalp hematoma 11/17/2013  . Concussion without loss of consciousness 11/17/2013  . COPD exacerbation 09/26/2013  . Vaginitis and vulvovaginitis 07/29/2013  . Sinusitis, acute maxillary 07/19/2012  . Musculoskeletal pain 05/21/2012  . IBS (irritable bowel syndrome) 04/02/2012  . Nail fungus 03/01/2012  . Ear bleeding 12/10/2011  . Generalized anxiety disorder 07/10/2011  . Parkinson's disease 02/03/2011  . Shoulder pain 02/03/2011  . Superficial abrasion 11/17/2010  . Recurrent UTI 11/01/2010  . GERD (gastroesophageal reflux disease) 11/01/2010  . Memory loss  11/01/2010  . COPD, mild 09/23/2010  . Hip pain, right 09/23/2010  . COMPRESSION FRACTURE, LUMBAR VERTEBRAE 03/28/2010  . ABDOMINAL PAIN 03/27/2010  . LACERATION, SCALP 03/27/2010  . Atrial fibrillation 03/22/2010  . DIZZINESS 03/22/2010  . Mycotic toenails 10/02/2009  . DIARRHEA-PRESUMED INFECTIOUS 04/02/2009  . DYSPNEA ON EXERTION 01/19/2009  . HEMORRHOIDS-INTERNAL 02/14/2008  . CONSTIPATION 02/14/2008  . PERSONAL HX COLONIC POLYPS 02/14/2008    Past Medical History  Diagnosis Date  . Arthritis   . Hyperlipidemia   . 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 Chronic constipation   . Hemorrhoid   . Varicose veins   . Anxiety   . Depression   . GERD (gastroesophageal reflux disease)   . Atrial fibrillation   . Recurrent UTI   . Parkinson disease   . Cardiac arrhythmia due to congenital heart disease   . Mycotic toenails 10/27/2012    Past Surgical History  Procedure Laterality Date  . Appendectomy    . Ileal resection  03/2007    Due to obstruction    History  Substance Use Topics  . Smoking status: Former Smoker -- 2.00 packs/day for 30 years    Types: Cigarettes    Quit date: 04/21/1980  . Smokeless tobacco: Never Used     Comment: onset age 38 -89, up to > 1ppd (almost 2 ppd)  . Alcohol Use: No    Family History  Problem Relation Age of Onset  . Asthma Brother   . Alcohol abuse Brother   . Throat cancer Father   . Alcohol abuse Father   . Lupus Daughter   .  Bipolar disorder Daughter   . Lupus Daughter   . Anxiety disorder Daughter   . Alcohol abuse Sister   . Alcohol abuse Maternal Grandfather   . Alcohol abuse Paternal Grandmother     Allergies  Allergen Reactions  . Adhesive [Tape] Other (See Comments)    Caused blisters  . Amoxicillin Other (See Comments)    Caused thrush  . Ciprofloxacin Other (See Comments)    Caused thrush  . Ultram [Tramadol] Other (See Comments)    hypotension    Medication list has been reviewed and  updated.  Current Outpatient Prescriptions on File Prior to Visit  Medication Sig Dispense Refill  . albuterol (VENTOLIN HFA) 108 (90 BASE) MCG/ACT inhaler Inhale 2 puffs into the lungs 2 (two) times daily as needed for wheezing or shortness of breath.     . Ascorbic Acid (VITAMIN C) 1000 MG tablet Take 1,000 mg by mouth daily.    . Calcium Carb-Cholecalciferol (CALCIUM 1000 + D PO) Take 1,000 mg by mouth daily.    . celecoxib (CELEBREX) 100 MG capsule Take 100 mg by mouth daily as needed (back pain).    . Cranberry 500 MG CAPS Take 500 mg by mouth daily.    . Omega-3 Fatty Acids (FISH OIL) 500 MG CAPS Take 500 mg by mouth daily.    Marland Kitchen OVER THE COUNTER MEDICATION Place 1 drop into both eyes 3 (three) times daily as needed (dry eyes). Over the counter eye drops    . sertraline (ZOLOFT) 100 MG tablet Take 100 mg by mouth daily.     Marland Kitchen trimethoprim (TRIMPEX) 100 MG tablet TAKE ONE TABLET BY MOUTH NIGHTLY AT BEDTIME 90 tablet 1  . vitamin B-12 (CYANOCOBALAMIN) 100 MCG tablet Take 100 mcg by mouth daily.    . vitamin B-12 (CYANOCOBALAMIN) 500 MCG tablet Take 500 mcg by mouth daily.     No current facility-administered medications on file prior to visit.    Review of Systems:  As per HPI, otherwise negative.    Physical Examination: Filed Vitals:   06/11/14 1401  BP: 90/55  Pulse: 79  Temp: 98.1 F (36.7 C)  Resp: 18   There were no vitals filed for this visit. There is no weight on file to calculate BMI. Ideal Body Weight:    GEN: WDWN, NAD, Non-toxic, A & O x 3 HEENT: Atraumatic, Normocephalic. Neck supple. No masses, No LAD. Ears and Nose: No external deformity. CV: RRR, No M/G/R. No JVD. No thrill. No extra heart sounds. PULM: CTA B, no wheezes, crackles, rhonchi. No retractions. No resp. distress. No accessory muscle use. ABD: S, NT, ND, +BS. No rebound. No HSM. EXTR: No c/c/e NEURO unsteady gait. PRRERLA EOMI CN 2-12 intact.   PSYCH: Normally interactive. Conversant. Not  depressed or anxious appearing.  Calm demeanor.    Assessment and Plan: Acute ataxia and dizziness To ER for evaluation  Signed,  Ellison Carwin, MD

## 2014-06-11 NOTE — Discharge Instructions (Signed)
Take the prescribed medication as directed.  May continue your meclizine as directed.  Continue to drink plenty of fluids to stay hydrated. Follow-up with your primary care physician. Return to the ED for new or worsening symptoms.

## 2014-06-11 NOTE — ED Provider Notes (Signed)
CSN: [id number]     Arrival date & time 06/11/14  1621 History   First MD Initiated Contact with Patient 06/11/14 1626     Chief Complaint  Patient presents with  . Dizziness     (Consider location/radiation/quality/duration/timing/severity/associated sxs/prior Treatment) Patient is a 81 y.o. female presenting with dizziness. The history is provided by the patient and medical records.  Dizziness   This is an 81 year old female with past medical history significant for hyperlipidemia, anxiety, depression, GERD, Parkinson's disease, vertigo, A. fib currently anticoagulated with Eliquis, presenting to the ED from urgent care for further evaluation of dizziness.  Patient states that for the past 2 days she has been having dizziness/lightheadedness only occuring upon standing.  States when she stands she feels that her blood pressure drops and that both of her legs become shaky and week.  She denies recent injuries, trauma, or falls.  She walks with a walker at baseline, occasionally a cane. She states she does have a hx of vertigo but states this feels somewhat different.  Patient also admits that she has not been taking her meclizine as she was not sure it was compatible with her Eliquis.  She denies any chest pain, SOB, palpitations, cough.  No recent illness, fever, chills, etc.  she does admit to some dysuria which is somewhat chronic for her. She has frequent UTIs and is followed by urology. No abdominal pain, nausea, vomiting, diarrhea.  VSS on arrival.  Past Medical History  Diagnosis Date  . Arthritis   . Hyperlipidemia   . 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 Chronic constipation   . Hemorrhoid   . Varicose veins   . Anxiety   . Depression   . GERD (gastroesophageal reflux disease)   . Atrial fibrillation   . Recurrent UTI   . Parkinson disease   . Cardiac arrhythmia due to congenital heart disease   . Mycotic toenails 10/27/2012   Past Surgical History  Procedure Laterality  Date  . Appendectomy    . Ileal resection  03/2007    Due to obstruction   Family History  Problem Relation Age of Onset  . Asthma Brother   . Alcohol abuse Brother   . Throat cancer Father   . Alcohol abuse Father   . Lupus Daughter   . Bipolar disorder Daughter   . Lupus Daughter   . Anxiety disorder Daughter   . Alcohol abuse Sister   . Alcohol abuse Maternal Grandfather   . Alcohol abuse Paternal Grandmother    History  Substance Use Topics  . Smoking status: Former Smoker -- 2.00 packs/day for 30 years    Types: Cigarettes    Quit date: 04/21/1980  . Smokeless tobacco: Never Used     Comment: onset age 32 -10, up to > 1ppd (almost 2 ppd)  . Alcohol Use: No   OB History    No data available     Review of Systems  Neurological: Positive for dizziness and light-headedness.  All other systems reviewed and are negative.     Allergies  Adhesive; Amoxicillin; Ciprofloxacin; and Ultram  Home Medications   Prior to Admission medications   Medication Sig Start Date End Date Taking? Authorizing Provider  albuterol (VENTOLIN HFA) 108 (90 BASE) MCG/ACT inhaler Inhale 2 puffs into the lungs 2 (two) times daily as needed for wheezing or shortness of breath.     Historical Provider, MD  apixaban (ELIQUIS) 5 MG TABS tablet Take 5 mg by mouth 2 (two)  times daily.    Historical Provider, MD  Ascorbic Acid (VITAMIN C) 1000 MG tablet Take 1,000 mg by mouth daily.    Historical Provider, MD  Calcium Carb-Cholecalciferol (CALCIUM 1000 + D PO) Take 1,000 mg by mouth daily.    Historical Provider, MD  celecoxib (CELEBREX) 100 MG capsule Take 100 mg by mouth daily as needed (back pain).    Historical Provider, MD  Cranberry 500 MG CAPS Take 500 mg by mouth daily.    Historical Provider, MD  meclizine (ANTIVERT) 25 MG tablet Take 25 mg by mouth 3 (three) times daily as needed for dizziness.    Historical Provider, MD  Omega-3 Fatty Acids (FISH OIL) 500 MG CAPS Take 500 mg by mouth  daily.    Historical Provider, MD  OVER THE COUNTER MEDICATION Place 1 drop into both eyes 3 (three) times daily as needed (dry eyes). Over the counter eye drops    Historical Provider, MD  sertraline (ZOLOFT) 100 MG tablet Take 100 mg by mouth daily.  06/03/13   Midge Minium, MD  trimethoprim (TRIMPEX) 100 MG tablet TAKE ONE TABLET BY MOUTH NIGHTLY AT BEDTIME 06/02/14   Midge Minium, MD  vitamin B-12 (CYANOCOBALAMIN) 100 MCG tablet Take 100 mcg by mouth daily.    Historical Provider, MD  vitamin B-12 (CYANOCOBALAMIN) 500 MCG tablet Take 500 mcg by mouth daily.    Historical Provider, MD   BP 130/75 mmHg  Pulse 56  Temp(Src) 98 F (36.7 C) (Oral)  Resp 17  Ht 5\' 10"  (1.778 m)  Wt 160 lb (72.576 kg)  BMI 22.96 kg/m2  SpO2 100%   Physical Exam  Constitutional: She is oriented to person, place, and time. She appears well-developed and well-nourished. No distress.  HENT:  Head: Normocephalic and atraumatic.  Mouth/Throat: Oropharynx is clear and moist.  Eyes: Conjunctivae and EOM are normal. Pupils are equal, round, and reactive to light.  Neck: Normal range of motion. Neck supple.  Cardiovascular: Normal rate, regular rhythm and normal heart sounds.   Pulmonary/Chest: Effort normal and breath sounds normal. No respiratory distress. She has no wheezes.  Abdominal: Soft. Bowel sounds are normal. There is no tenderness. There is no guarding.  Musculoskeletal: Normal range of motion.  Neurological: She is alert and oriented to person, place, and time.  AAOx3, answering questions and following commands appropriately; equal strength UE and LE bilaterally; CN grossly intact; moves all extremities appropriately without ataxia; slight tremor of RUE at baseline; no focal neuro deficits or facial asymmetry appreciated  Skin: Skin is warm and dry. She is not diaphoretic.  Psychiatric: She has a normal mood and affect.  Nursing note and vitals reviewed.   ED Course  Procedures  (including critical care time) Labs Review Labs Reviewed  CBC WITH DIFFERENTIAL/PLATELET - Abnormal; Notable for the following:    Hemoglobin 11.9 (*)    All other components within normal limits  BASIC METABOLIC PANEL - Abnormal; Notable for the following:    Glucose, Bld 69 (*)    GFR calc non Af Amer 59 (*)    GFR calc Af Amer 68 (*)    Anion gap 4 (*)    All other components within normal limits  URINALYSIS, ROUTINE W REFLEX MICROSCOPIC - Abnormal; Notable for the following:    Leukocytes, UA TRACE (*)    All other components within normal limits  URINE CULTURE  URINE MICROSCOPIC-ADD ON  I-STAT TROPOININ, ED    Imaging Review No results found.  EKG Interpretation   Date/Time:  Sunday June 11 2014 16:35:49 EST Ventricular Rate:  57 PR Interval:  170 QRS Duration: 85 QT Interval:  483 QTC Calculation: 470 R Axis:   73 Text Interpretation:  Sinus rhythm Atrial premature complex No significant  change since last tracing Confirmed by Debby Freiberg [phone]) on  06/11/2014 6:46:05 PM      MDM   Final diagnoses:  Dizziness  UTI (lower urinary tract infection)   81 year old female with dizziness upon standing for the past 2 days. She has history of vertigo but states this feels different. On exam, patient no acute distress. Her neurologic exam is nonfocal. She is able to text on her cell phone without difficulty.  She was ambulated in the room, no ataxia. Suspicion of orthostatic hypotension. Patient given small fluid bolus. EKG reassuring, bradycardia noted which is unchanged from prior.  Will obtain basic labs and urinalysis. She has taken a dose of her home meclizine in the ED.  Labwork reassuring. No dizziness while in the ED.  Do not suspect neurologic or cardiac cause of her dizziness at this time.  U/a appears infectious today, will send for culture. Patient does have hx of recurrent UTI's.  States she has been taking bactrim which does not seem to be helping.   States she has also taken keflex in the past which did not help either.  She is hypersensitive to cipro and amoxicillin.  Most recent urine culture shows E. coli with sensitivity to macrobid, will start on short course.  Will continue meclizine as needed.  Patient to FU with PCP.  Discussed plan with patient, he/she acknowledged understanding and agreed with plan of care.  Return precautions given for new or worsening symptoms.  Case discussed with attending physician, Dr. Colin Rhein, who evaluated patient and agrees with assessment and plan of care.  Larene Pickett, PA-C 06/11/14 2237  Debby Freiberg, MD 06/15/14 [phone]

## 2014-06-12 ENCOUNTER — Ambulatory Visit (INDEPENDENT_AMBULATORY_CARE_PROVIDER_SITE_OTHER): Payer: Medicare Other | Admitting: Physician Assistant

## 2014-06-12 ENCOUNTER — Encounter: Payer: Self-pay | Admitting: Physician Assistant

## 2014-06-12 VITALS — BP 107/62 | HR 78 | Temp 98.1°F | Resp 16 | Ht 70.0 in | Wt 167.1 lb

## 2014-06-12 DIAGNOSIS — R42 Dizziness and giddiness: Secondary | ICD-10-CM | POA: Diagnosis not present

## 2014-06-12 NOTE — Patient Instructions (Signed)
Please stay well hydrated.  Add a G2 Gatorade (small bottle) to your daily regimen.  Eat a small meal or healthy snack every 3-4 hours to keep your blood sugar stable.  Add some protein to the carbs and sweets you have been eating.  Continue the Meclizine as directed if needed.  Follow-up with myself or Dr. Birdie Riddle in 1.5 weeks.

## 2014-06-12 NOTE — Assessment & Plan Note (Signed)
BP slightly hypotensive.  Agree with ER MD's assessment that positional hypotension is culprit. Hgb stable.  Discussed adequate hydration.  G2 Gatorade daily. Taking time with positional changes will be key.  Discussed compression stockings but patient declines presently.  Discussed urine findings.  Will wait for urine culture before starting antibiotics.   Follow-up 2 weeks.

## 2014-06-12 NOTE — Progress Notes (Signed)
Pre visit review using our clinic review tool, if applicable. No additional management support is needed unless otherwise documented below in the visit note/SLS  

## 2014-06-12 NOTE — Progress Notes (Signed)
Patient presents to clinic today for ER follow-up of dizziness and orthostatic hypotension.  Patient was seen in the ER yesterday and diagnosed with dizziness secondary to orthostatic hypotension.  Labs unremarkable except for trace LE in urine dip.  Culture pending. Patient's symptoms resolved with bolus of fluids.  Patient states that since then she has had a couple of episodes of mild dizziness/lightheadedness upon standing.  Is trying to stay well hydrated.  Has Rx for meclizine for her intermittent vertigo.  Patient is wanting to discuss supportive measures to help with her symptoms.  Past Medical History  Diagnosis Date  . Arthritis   . Hyperlipidemia   . 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 Chronic constipation   . Hemorrhoid   . Varicose veins   . Anxiety   . Depression   . GERD (gastroesophageal reflux disease)   . Atrial fibrillation   . Recurrent UTI   . Parkinson disease   . Cardiac arrhythmia due to congenital heart disease   . Mycotic toenails 10/27/2012    Current Outpatient Prescriptions on File Prior to Visit  Medication Sig Dispense Refill  . albuterol (VENTOLIN HFA) 108 (90 BASE) MCG/ACT inhaler Inhale 2 puffs into the lungs 2 (two) times daily as needed for wheezing or shortness of breath.     Marland Kitchen apixaban (ELIQUIS) 5 MG TABS tablet Take 5 mg by mouth 2 (two) times daily.    . Ascorbic Acid (VITAMIN C) 1000 MG tablet Take 1,000 mg by mouth daily.    . Calcium Carb-Cholecalciferol (CALCIUM 1000 + D PO) Take 1,000 mg by mouth daily.    . celecoxib (CELEBREX) 100 MG capsule Take 100 mg by mouth daily as needed (back pain).    . Omega-3 Fatty Acids (FISH OIL) 500 MG CAPS Take 500 mg by mouth daily.    Marland Kitchen OVER THE COUNTER MEDICATION Place 1 drop into both eyes 3 (three) times daily as needed (dry eyes). Over the counter eye drops    . sertraline (ZOLOFT) 100 MG tablet Take 100 mg by mouth daily.     . SUPER B COMPLEX/C PO Take 1 tablet by mouth daily.    Marland Kitchen  trimethoprim (TRIMPEX) 100 MG tablet TAKE ONE TABLET BY MOUTH NIGHTLY AT BEDTIME 90 tablet 1  . nitrofurantoin, macrocrystal-monohydrate, (MACROBID) 100 MG capsule Take 1 capsule (100 mg total) by mouth 2 (two) times daily. (Patient not taking: Reported on 06/12/2014) 10 capsule 0   No current facility-administered medications on file prior to visit.    Allergies  Allergen Reactions  . Adhesive [Tape] Other (See Comments)    Caused blisters  . Amoxicillin Other (See Comments)    Caused thrush  . Ciprofloxacin Other (See Comments)    Caused thrush  . Ultram [Tramadol] Other (See Comments)    hypotension    Family History  Problem Relation Age of Onset  . Asthma Brother   . Alcohol abuse Brother   . Throat cancer Father   . Alcohol abuse Father   . Lupus Daughter   . Bipolar disorder Daughter   . Lupus Daughter   . Anxiety disorder Daughter   . Alcohol abuse Sister   . Alcohol abuse Maternal Grandfather   . Alcohol abuse Paternal Grandmother     History   Social History  . Marital Status: Widowed    Spouse Name: N/A  . Number of Children: N/A  . Years of Education: N/A   Social History Main Topics  . Smoking status:  Former Smoker -- 2.00 packs/day for 30 years    Types: Cigarettes    Quit date: 04/21/1980  . Smokeless tobacco: Never Used     Comment: onset age 24 -24, up to > 1ppd (almost 2 ppd)  . Alcohol Use: No  . Drug Use: No  . Sexual Activity: No   Other Topics Concern  . None   Social History Narrative   Widowed, lives alone. 1 child in [place] and 1 in [place].   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   Review of Systems - See HPI.  All other ROS are negative.  BP 107/62 mmHg  Pulse 78  Temp(Src) 98.1 F (36.7 C) (Oral)  Resp 16  Ht $R'5\' 10"'AP$  (1.778 m)  Wt 167 lb 2 oz (75.807 kg)  BMI 23.98 kg/m2  SpO2 97%  Physical Exam  Constitutional: She is oriented to person, place, and time and well-developed, well-nourished, and in no distress.  HENT:  Head:  Normocephalic and atraumatic.  Right Ear: External ear normal.  Left Ear: External ear normal.  Nose: Nose normal.  Mouth/Throat: Oropharynx is clear and moist. No oropharyngeal exudate.  TM within normal limits bilaterally.  Eyes: Conjunctivae are normal.  Neck: Neck supple.  Cardiovascular: Normal rate, regular rhythm, normal heart sounds and intact distal pulses.   Pulmonary/Chest: Effort normal and breath sounds normal. No respiratory distress. She has no wheezes. She has no rales. She exhibits no tenderness.  Neurological: She is alert and oriented to person, place, and time.  Skin: Skin is warm and dry. No rash noted.  Psychiatric: Affect normal.  Vitals reviewed.   Recent Results (from the past 2160 hour(s))  CBC with Differential     Status: Abnormal   Collection Time: 06/11/14  4:57 PM  Result Value Ref Range   WBC 5.8 4.0 - 10.5 K/uL   RBC 3.98 3.87 - 5.11 MIL/uL   Hemoglobin 11.9 (L) 12.0 - 15.0 g/dL   HCT 36.8 36.0 - 46.0 %   MCV 92.5 78.0 - 100.0 fL   MCH 29.9 26.0 - 34.0 pg   MCHC 32.3 30.0 - 36.0 g/dL   RDW 14.1 11.5 - 15.5 %   Platelets 200 150 - 400 K/uL   Neutrophils Relative % 54 43 - 77 %   Neutro Abs 3.2 1.7 - 7.7 K/uL   Lymphocytes Relative 34 12 - 46 %   Lymphs Abs 2.0 0.7 - 4.0 K/uL   Monocytes Relative 9 3 - 12 %   Monocytes Absolute 0.5 0.1 - 1.0 K/uL   Eosinophils Relative 2 0 - 5 %   Eosinophils Absolute 0.1 0.0 - 0.7 K/uL   Basophils Relative 1 0 - 1 %   Basophils Absolute 0.0 0.0 - 0.1 K/uL  Basic metabolic panel     Status: Abnormal   Collection Time: 06/11/14  4:57 PM  Result Value Ref Range   Sodium 140 135 - 145 mmol/L   Potassium 3.6 3.5 - 5.1 mmol/L   Chloride 107 96 - 112 mmol/L   CO2 29 19 - 32 mmol/L   Glucose, Bld 69 (L) 70 - 99 mg/dL   BUN 10 6 - 23 mg/dL   Creatinine, Ser 0.88 0.50 - 1.10 mg/dL   Calcium 9.4 8.4 - 10.5 mg/dL   GFR calc non Af Amer 59 (L) >90 mL/min   GFR calc Af Amer 68 (L) >90 mL/min    Comment:  (NOTE) The eGFR has been calculated using the CKD EPI equation. This  calculation has not been validated in all clinical situations. eGFR's persistently <90 mL/min signify possible Chronic Kidney Disease.    Anion gap 4 (L) 5 - 15  I-stat troponin, ED     Status: None   Collection Time: 06/11/14  5:09 PM  Result Value Ref Range   Troponin i, poc 0.01 0.00 - 0.08 ng/mL   Comment 3            Comment: Due to the release kinetics of cTnI, a negative result within the first hours of the onset of symptoms does not rule out myocardial infarction with certainty. If myocardial infarction is still suspected, repeat the test at appropriate intervals.   Urinalysis, Routine w reflex microscopic     Status: Abnormal   Collection Time: 06/11/14  5:35 PM  Result Value Ref Range   Color, Urine YELLOW YELLOW   APPearance CLEAR CLEAR   Specific Gravity, Urine 1.008 1.005 - 1.030   pH 6.0 5.0 - 8.0   Glucose, UA NEGATIVE NEGATIVE mg/dL   Hgb urine dipstick NEGATIVE NEGATIVE   Bilirubin Urine NEGATIVE NEGATIVE   Ketones, ur NEGATIVE NEGATIVE mg/dL   Protein, ur NEGATIVE NEGATIVE mg/dL   Urobilinogen, UA 0.2 0.0 - 1.0 mg/dL   Nitrite NEGATIVE NEGATIVE   Leukocytes, UA TRACE (A) NEGATIVE  Urine microscopic-add on     Status: None   Collection Time: 06/11/14  5:35 PM  Result Value Ref Range   Squamous Epithelial / LPF RARE RARE   WBC, UA 7-10 <3 WBC/hpf   RBC / HPF 0-2 <3 RBC/hpf   Bacteria, UA RARE RARE    Assessment/Plan: Lightheadedness BP slightly hypotensive.  Agree with ER MD's assessment that positional hypotension is culprit. Hgb stable.  Discussed adequate hydration.  G2 Gatorade daily. Taking time with positional changes will be key.  Discussed compression stockings but patient declines presently.  Discussed urine findings.  Will wait for urine culture before starting antibiotics.   Follow-up 2 weeks.

## 2014-06-13 ENCOUNTER — Ambulatory Visit: Payer: BLUE CROSS/BLUE SHIELD | Admitting: Psychiatry

## 2014-06-13 LAB — URINE CULTURE
CULTURE: NO GROWTH
Colony Count: NO GROWTH

## 2014-06-14 ENCOUNTER — Ambulatory Visit: Payer: Medicare Other | Admitting: Podiatrist

## 2014-06-14 DIAGNOSIS — H608X3 Other otitis externa, bilateral: Secondary | ICD-10-CM | POA: Diagnosis not present

## 2014-06-16 ENCOUNTER — Encounter: Payer: Self-pay | Admitting: Podiatrist

## 2014-06-16 ENCOUNTER — Ambulatory Visit (INDEPENDENT_AMBULATORY_CARE_PROVIDER_SITE_OTHER): Payer: Medicare Other | Admitting: Podiatrist

## 2014-06-16 DIAGNOSIS — M79676 Pain in unspecified toe(s): Secondary | ICD-10-CM | POA: Diagnosis not present

## 2014-06-16 DIAGNOSIS — B351 Tinea unguium: Secondary | ICD-10-CM | POA: Diagnosis not present
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
## 2014-06-20 ENCOUNTER — Ambulatory Visit (INDEPENDENT_AMBULATORY_CARE_PROVIDER_SITE_OTHER): Payer: Medicare Other | Admitting: Physician Assistant

## 2014-06-20 ENCOUNTER — Encounter: Payer: Self-pay | Admitting: Physician Assistant

## 2014-06-20 VITALS — BP 104/68 | HR 64 | Temp 98.2°F | Resp 16 | Ht 70.0 in | Wt 161.5 lb

## 2014-06-20 DIAGNOSIS — R42 Dizziness and giddiness: Secondary | ICD-10-CM

## 2014-06-20 DIAGNOSIS — R5383 Other fatigue: Secondary | ICD-10-CM

## 2014-06-20 DIAGNOSIS — N390 Urinary tract infection, site not specified: Secondary | ICD-10-CM | POA: Diagnosis not present

## 2014-06-20 LAB — HEPATIC FUNCTION PANEL
ALBUMIN: 4 g/dL (ref 3.5–5.2)
ALK PHOS: 75 U/L (ref 39–117)
ALT: 9 U/L (ref 0–35)
AST: 18 U/L (ref 0–37)
Bilirubin, Direct: 0.1 mg/dL (ref 0.0–0.3)
Total Bilirubin: 0.4 mg/dL (ref 0.2–1.2)
Total Protein: 7 g/dL (ref 6.0–8.3)

## 2014-06-20 LAB — CBC
HCT: 36.9 % (ref 36.0–46.0)
HEMOGLOBIN: 12.5 g/dL (ref 12.0–15.0)
MCHC: 33.8 g/dL (ref 30.0–36.0)
MCV: 90.7 fl (ref 78.0–100.0)
Platelets: 235 10*3/uL (ref 150.0–400.0)
RBC: 4.07 Mil/uL (ref 3.87–5.11)
RDW: 14.2 % (ref 11.5–15.5)
WBC: 5.7 10*3/uL (ref 4.0–10.5)

## 2014-06-20 LAB — BASIC METABOLIC PANEL
BUN: 14 mg/dL (ref 6–23)
CO2: 30 mEq/L (ref 19–32)
Calcium: 9.6 mg/dL (ref 8.4–10.5)
Chloride: 101 mEq/L (ref 96–112)
Creatinine, Ser: 0.97 mg/dL (ref 0.40–1.20)
GFR: 58.09 mL/min — AB (ref >60.00)
GLUCOSE: 67 mg/dL — AB (ref 70–99)
Potassium: 4.2 mEq/L (ref 3.5–5.1)
SODIUM: 136 meq/L (ref 135–145)

## 2014-06-20 LAB — TSH: TSH: 3.68 u[IU]/mL (ref 0.35–4.50)

## 2014-06-20 LAB — SEDIMENTATION RATE: Sed Rate: 39 mm/hr — ABNORMAL HIGH (ref 0–22)

## 2014-06-20 NOTE — Patient Instructions (Signed)
Please stop by the lab for blood work.  I will call you with your results. Continue good hydration and daily gatorade.  I do recommend you get some compression stockings at the pharmacy and wear those during the day.  Continue the Trimpex as directed.  Your urine looks good in office.  I do want you to schedule an appointment with your Urologist for further assessment.

## 2014-06-20 NOTE — Progress Notes (Signed)
Pre visit review using our clinic review tool, if applicable. No additional management support is needed unless otherwise documented below in the visit note/SLS  

## 2014-06-21 DIAGNOSIS — R5383 Other fatigue: Secondary | ICD-10-CM | POA: Insufficient documentation

## 2014-06-21 LAB — ANA: ANA: NEGATIVE

## 2014-06-21 NOTE — Assessment & Plan Note (Signed)
Resolved with increased fluids and salt intake. Orthostatic vitals are negative. Patient to continue current regimen.

## 2014-06-21 NOTE — Assessment & Plan Note (Signed)
Recent urine culture negative. Repeat urine dip performed today with trace leuks but no other evidence of infection. There is not enough urine collected to send for culture. Patient encouraged to continue her increased hydration and cranberry supplementation. Continue trimethoprim daily. Schedule appointment with urology for further assessment as there could be other factors that are causing her symptoms.

## 2014-06-21 NOTE — Assessment & Plan Note (Signed)
Unclear etiology. Patient with multiple illnesses that could contribute to this. Does endorse both of her daughters have lupus. Has never been diagnosed personally with this condition. There is a potential worsening of depression and Parkinson's. Parkinsonian gait and rigidity noted. There is also mild tremor. Will obtain lab workup today to include CBC, BMP, LFTs, TSH, sedimentation rate and ANA. Recommend patient schedule an appointment with her neurologist to rediscuss her Parkinson's. Encouraged patient to stay active as this will help with energy levels. We'll treat based on findings.

## 2014-06-21 NOTE — Progress Notes (Signed)
Patient presents to clinic today for follow-up positional lightheadedness. Patient states since last visit she is doing very well. Has increased her fluid intake and is drinking a Gatorade daily per instructions. Endorses being to ambulate without dizziness.  Patient also wishes to discuss chronic fatigue that has been present for several months. States she is sleeping more at night, up to 11 hours. Denies fever or unexplainable weight loss. Does have history of depression for which she is being treated. Denies worsening of mood. Denies suicidal thought or ideation. Patient does have prior diagnosis of Parkinson disease. States she's been denial about this. She has hurt a little bit about Parkinson dementia and depression associated with this illness. She is wondering of her symptoms could be stemming from this.   Patient also complains of continued UTI symptoms. Recent culture was unremarkable. Endorses urinary urgency, but denies hematuria, dysuria, nausea, vomiting or flank pain. Is on trimethoprim daily for UTI prophylaxis.  Past Medical History  Diagnosis Date  . Arthritis   . Hyperlipidemia   . 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 Chronic constipation   . Hemorrhoid   . Varicose veins   . Anxiety   . Depression   . GERD (gastroesophageal reflux disease)   . Atrial fibrillation   . Recurrent UTI   . Parkinson disease   . Cardiac arrhythmia due to congenital heart disease   . Mycotic toenails 10/27/2012    Current Outpatient Prescriptions on File Prior to Visit  Medication Sig Dispense Refill  . albuterol (VENTOLIN HFA) 108 (90 BASE) MCG/ACT inhaler Inhale 2 puffs into the lungs 2 (two) times daily as needed for wheezing or shortness of breath.     Marland Kitchen apixaban (ELIQUIS) 5 MG TABS tablet Take 5 mg by mouth 2 (two) times daily.    . Ascorbic Acid (VITAMIN C) 1000 MG tablet Take 1,000 mg by mouth daily.    . Calcium Carb-Cholecalciferol (CALCIUM 1000 + D PO) Take 1,000 mg by  mouth daily.    . celecoxib (CELEBREX) 100 MG capsule Take 100 mg by mouth daily as needed (back pain).    . meclizine (ANTIVERT) 25 MG tablet Take 50 mg by mouth 3 (three) times daily as needed for dizziness.    . Omega-3 Fatty Acids (FISH OIL) 500 MG CAPS Take 500 mg by mouth daily.    Marland Kitchen OVER THE COUNTER MEDICATION Place 1 drop into both eyes 3 (three) times daily as needed (dry eyes). Over the counter eye drops    . sertraline (ZOLOFT) 100 MG tablet Take 100 mg by mouth daily.     . SUPER B COMPLEX/C PO Take 1 tablet by mouth daily.    Marland Kitchen trimethoprim (TRIMPEX) 100 MG tablet TAKE ONE TABLET BY MOUTH NIGHTLY AT BEDTIME 90 tablet 1   No current facility-administered medications on file prior to visit.    Allergies  Allergen Reactions  . Adhesive [Tape] Other (See Comments)    Caused blisters  . Amoxicillin Other (See Comments)    Caused thrush  . Ciprofloxacin Other (See Comments)    Caused thrush  . Ultram [Tramadol] Other (See Comments)    hypotension    Family History  Problem Relation Age of Onset  . Asthma Brother   . Alcohol abuse Brother   . Throat cancer Father   . Alcohol abuse Father   . Lupus Daughter   . Bipolar disorder Daughter   . Lupus Daughter   . Anxiety disorder Daughter   .  Alcohol abuse Sister   . Alcohol abuse Maternal Grandfather   . Alcohol abuse Paternal Grandmother     History   Social History  . Marital Status: Widowed    Spouse Name: N/A  . Number of Children: N/A  . Years of Education: N/A   Social History Main Topics  . Smoking status: Former Smoker -- 2.00 packs/day for 30 years    Types: Cigarettes    Quit date: 04/21/1980  . Smokeless tobacco: Never Used     Comment: onset age 88 -31, up to > 1ppd (almost 2 ppd)  . Alcohol Use: No  . Drug Use: No  . Sexual Activity: No   Other Topics Concern  . None   Social History Narrative   Widowed, lives alone. 1 child in [place] and 1 in [place].   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     Review of Systems - See HPI.  All other ROS are negative.  BP 104/68 mmHg  Pulse 64  Temp(Src) 98.2 F (36.8 C) (Oral)  Resp 16  Ht $R'5\' 10"'IN$  (1.778 m)  Wt 161 lb 8 oz (73.256 kg)  BMI 23.17 kg/m2  SpO2 97%  Physical Exam  Constitutional: She is oriented to person, place, and time and well-developed, well-nourished, and in no distress.  HENT:  Head: Normocephalic and atraumatic.  Right Ear: External ear normal.  Left Ear: External ear normal.  Nose: Nose normal.  Mouth/Throat: Oropharynx is clear and moist. No oropharyngeal exudate.  TM within normal limits bilaterally.  Eyes: Conjunctivae and EOM are normal. Pupils are equal, round, and reactive to light.  Neck: Neck supple.  Cardiovascular: Normal rate, regular rhythm, normal heart sounds and intact distal pulses.   Pulmonary/Chest: Effort normal and breath sounds normal. No respiratory distress. She has no wheezes. She has no rales. She exhibits no tenderness.  Lymphadenopathy:    She has no cervical adenopathy.  Neurological: She is alert and oriented to person, place, and time.  Skin: Skin is warm and dry. No rash noted.  Psychiatric: Affect normal.  Vitals reviewed.   Recent Results (from the past 2160 hour(s))  CBC with Differential     Status: Abnormal   Collection Time: 06/11/14  4:57 PM  Result Value Ref Range   WBC 5.8 4.0 - 10.5 K/uL   RBC 3.98 3.87 - 5.11 MIL/uL   Hemoglobin 11.9 (L) 12.0 - 15.0 g/dL   HCT 36.8 36.0 - 46.0 %   MCV 92.5 78.0 - 100.0 fL   MCH 29.9 26.0 - 34.0 pg   MCHC 32.3 30.0 - 36.0 g/dL   RDW 14.1 11.5 - 15.5 %   Platelets 200 150 - 400 K/uL   Neutrophils Relative % 54 43 - 77 %   Neutro Abs 3.2 1.7 - 7.7 K/uL   Lymphocytes Relative 34 12 - 46 %   Lymphs Abs 2.0 0.7 - 4.0 K/uL   Monocytes Relative 9 3 - 12 %   Monocytes Absolute 0.5 0.1 - 1.0 K/uL   Eosinophils Relative 2 0 - 5 %   Eosinophils Absolute 0.1 0.0 - 0.7 K/uL   Basophils Relative 1 0 - 1 %   Basophils Absolute 0.0  0.0 - 0.1 K/uL  Basic metabolic panel     Status: Abnormal   Collection Time: 06/11/14  4:57 PM  Result Value Ref Range   Sodium 140 135 - 145 mmol/L   Potassium 3.6 3.5 - 5.1 mmol/L   Chloride 107 96 - 112 mmol/L  CO2 29 19 - 32 mmol/L   Glucose, Bld 69 (L) 70 - 99 mg/dL   BUN 10 6 - 23 mg/dL   Creatinine, Ser 0.88 0.50 - 1.10 mg/dL   Calcium 9.4 8.4 - 10.5 mg/dL   GFR calc non Af Amer 59 (L) >90 mL/min   GFR calc Af Amer 68 (L) >90 mL/min    Comment: (NOTE) The eGFR has been calculated using the CKD EPI equation. This calculation has not been validated in all clinical situations. eGFR's persistently <90 mL/min signify possible Chronic Kidney Disease.    Anion gap 4 (L) 5 - 15  I-stat troponin, ED     Status: None   Collection Time: 06/11/14  5:09 PM  Result Value Ref Range   Troponin i, poc 0.01 0.00 - 0.08 ng/mL   Comment 3            Comment: Due to the release kinetics of cTnI, a negative result within the first hours of the onset of symptoms does not rule out myocardial infarction with certainty. If myocardial infarction is still suspected, repeat the test at appropriate intervals.   Urinalysis, Routine w reflex microscopic     Status: Abnormal   Collection Time: 06/11/14  5:35 PM  Result Value Ref Range   Color, Urine YELLOW YELLOW   APPearance CLEAR CLEAR   Specific Gravity, Urine 1.008 1.005 - 1.030   pH 6.0 5.0 - 8.0   Glucose, UA NEGATIVE NEGATIVE mg/dL   Hgb urine dipstick NEGATIVE NEGATIVE   Bilirubin Urine NEGATIVE NEGATIVE   Ketones, ur NEGATIVE NEGATIVE mg/dL   Protein, ur NEGATIVE NEGATIVE mg/dL   Urobilinogen, UA 0.2 0.0 - 1.0 mg/dL   Nitrite NEGATIVE NEGATIVE   Leukocytes, UA TRACE (A) NEGATIVE  Urine microscopic-add on     Status: None   Collection Time: 06/11/14  5:35 PM  Result Value Ref Range   Squamous Epithelial / LPF RARE RARE   WBC, UA 7-10 <3 WBC/hpf   RBC / HPF 0-2 <3 RBC/hpf   Bacteria, UA RARE RARE  Urine culture     Status: None    Collection Time: 06/11/14  5:35 PM  Result Value Ref Range   Specimen Description URINE, RANDOM    Special Requests NONE    Colony Count NO GROWTH Performed at [facility]     Culture NO GROWTH Performed at [facility]     Report Status 06/13/2014 FINAL   Basic metabolic panel     Status: Abnormal   Collection Time: 06/20/14 11:50 AM  Result Value Ref Range   Sodium 136 135 - 145 mEq/L   Potassium 4.2 3.5 - 5.1 mEq/L   Chloride 101 96 - 112 mEq/L   CO2 30 19 - 32 mEq/L   Glucose, Bld 67 (L) 70 - 99 mg/dL   BUN 14 6 - 23 mg/dL   Creatinine, Ser 0.97 0.40 - 1.20 mg/dL   Calcium 9.6 8.4 - 10.5 mg/dL   GFR 58.09 (L) >60.00 mL/min  CBC     Status: None   Collection Time: 06/20/14 11:50 AM  Result Value Ref Range   WBC 5.7 4.0 - 10.5 K/uL   RBC 4.07 3.87 - 5.11 Mil/uL   Platelets 235.0 150.0 - 400.0 K/uL   Hemoglobin 12.5 12.0 - 15.0 g/dL   HCT 36.9 36.0 - 46.0 %   MCV 90.7 78.0 - 100.0 fl   MCHC 33.8 30.0 - 36.0 g/dL   RDW 14.2 11.5 -  15.5 %  TSH     Status: None   Collection Time: 06/20/14 11:50 AM  Result Value Ref Range   TSH 3.68 0.35 - 4.50 uIU/mL  Hepatic function panel     Status: None   Collection Time: 06/20/14 11:50 AM  Result Value Ref Range   Total Bilirubin 0.4 0.2 - 1.2 mg/dL   Bilirubin, Direct 0.1 0.0 - 0.3 mg/dL   Alkaline Phosphatase 75 39 - 117 U/L   AST 18 0 - 37 U/L   ALT 9 0 - 35 U/L   Total Protein 7.0 6.0 - 8.3 g/dL   Albumin 4.0 3.5 - 5.2 g/dL  ANA     Status: None   Collection Time: 06/20/14 11:50 AM  Result Value Ref Range   Anit Nuclear Antibody(ANA) NEG NEGATIVE  Sed Rate (ESR)     Status: Abnormal   Collection Time: 06/20/14 11:50 AM  Result Value Ref Range   Sed Rate 39 (H) 0 - 22 mm/hr    Assessment/Plan: Lightheadedness Resolved with increased fluids and salt intake. Orthostatic vitals are negative. Patient to continue current regimen.   Other fatigue Unclear etiology. Patient with multiple illnesses that  could contribute to this. Does endorse both of her daughters have lupus. Has never been diagnosed personally with this condition. There is a potential worsening of depression and Parkinson's. Parkinsonian gait and rigidity noted. There is also mild tremor. Will obtain lab workup today to include CBC, BMP, LFTs, TSH, sedimentation rate and ANA. Recommend patient schedule an appointment with her neurologist to rediscuss her Parkinson's. Encouraged patient to stay active as this will help with energy levels. We'll treat based on findings.     Recurrent UTI Recent urine culture negative. Repeat urine dip performed today with trace leuks but no other evidence of infection. There is not enough urine collected to send for culture. Patient encouraged to continue her increased hydration and cranberry supplementation. Continue trimethoprim daily. Schedule appointment with urology for further assessment as there could be other factors that are causing her symptoms.

## 2014-06-22 ENCOUNTER — Telehealth: Payer: Self-pay | Admitting: Family Medicine

## 2014-06-22 DIAGNOSIS — N390 Urinary tract infection, site not specified: Secondary | ICD-10-CM

## 2014-06-22 NOTE — Telephone Encounter (Signed)
Caller name: Breean, Nannini Relation to pt: self  Call back number: [phone]   Reason for call:  Pt stated PA is requesting a follow up with  urologist and there requesting an order please fax to [facility] [phone] (fax)

## 2014-06-22 NOTE — Telephone Encounter (Signed)
Caller name: Delois Relation to pt: self Call back number: [phone] Pharmacy:  Reason for call:   Patient requesting lab results

## 2014-06-23 NOTE — Telephone Encounter (Signed)
SEE Result note. 

## 2014-06-23 NOTE — Telephone Encounter (Signed)
Im assuming this means a referral.  Referral placed to [facility].

## 2014-06-27 ENCOUNTER — Encounter: Payer: Self-pay | Admitting: Family Medicine

## 2014-07-04 ENCOUNTER — Ambulatory Visit (INDEPENDENT_AMBULATORY_CARE_PROVIDER_SITE_OTHER): Payer: Medicare Other | Admitting: Psychiatry

## 2014-07-04 DIAGNOSIS — N3941 Urge incontinence: Secondary | ICD-10-CM | POA: Diagnosis not present

## 2014-07-04 DIAGNOSIS — N3281 Overactive bladder: Secondary | ICD-10-CM | POA: Diagnosis not present

## 2014-07-04 DIAGNOSIS — F419 Anxiety disorder, unspecified: Secondary | ICD-10-CM

## 2014-07-11 ENCOUNTER — Ambulatory Visit (INDEPENDENT_AMBULATORY_CARE_PROVIDER_SITE_OTHER): Payer: Medicare Other | Admitting: Pulmonary Disease

## 2014-07-11 ENCOUNTER — Encounter: Payer: Self-pay | Admitting: Pulmonary Disease

## 2014-07-11 VITALS — BP 124/62 | HR 56 | Temp 97.0°F | Ht 70.0 in | Wt 162.2 lb

## 2014-07-11 DIAGNOSIS — J449 Chronic obstructive pulmonary disease, unspecified: Secondary | ICD-10-CM | POA: Diagnosis not present

## 2014-07-11 NOTE — Assessment & Plan Note (Signed)
The patient continues to do very well from a pulmonary standpoint, and is exercising on a regular basis. She rarely requires albuterol, but I have offered again to try her on a long-acting beta agonist to see if she would see a difference in her exertional tolerance. This did not help her in the past, and she feels that she cannot afford it. I have asked her to continue with her conditioning program, and to let us know if her breathing changes.

## 2014-07-11 NOTE — Progress Notes (Signed)
   Subjective:    Patient ID: [name], female    DOB: Jul 21, 1927, 81 y.o.   MRN: [id number]  HPI The patient comes in today for follow-up of her known mild COPD. She is staying very active, and exercises 4-5 days a week on a consistent basis. She feels that her breathing is at her usual baseline, and has had no significant cough or chest congestion. She is using albuterol as needed, but very rarely. She has not had an acute exacerbation since last visit.   Review of Systems  Constitutional: Negative for fever, chills and unexpected weight change.  HENT: Positive for congestion and postnasal drip. Negative for dental problem, ear pain, nosebleeds, rhinorrhea, sinus pressure, sneezing, sore throat, trouble swallowing and voice change.   Eyes: Negative for redness, itching and visual disturbance.  Respiratory: Positive for cough, shortness of breath and wheezing. Negative for choking and chest tightness.   Cardiovascular: Negative for chest pain, palpitations and leg swelling.  Gastrointestinal: Negative for nausea, vomiting, abdominal pain and diarrhea.  Genitourinary: Negative for dysuria and difficulty urinating.  Musculoskeletal: Negative for joint swelling and arthralgias.  Skin: Negative for rash.  Neurological: Negative for tremors, syncope and headaches.  Hematological: Does not bruise/bleed easily.  Psychiatric/Behavioral: Negative for dysphoric mood. The patient is not nervous/anxious.        Objective:   Physical Exam Well developed female in nad Nose without purulence or d/c noted. Neck without LN or TMG Chest with clear bs, no wheezing Cor with rrr LE without edema, no cyanosis Alert and oriented, moves all 4       Assessment & Plan:

## 2014-07-11 NOTE — Patient Instructions (Signed)
Stay on albuterol as needed. If you would like to try again a long acting form of albuterol, let us know Continue to stay active as you are doing.  followup with me again in one year, but call if you are having breathing issues.

## 2014-07-18 ENCOUNTER — Ambulatory Visit (INDEPENDENT_AMBULATORY_CARE_PROVIDER_SITE_OTHER): Payer: Medicare Other | Admitting: Psychiatry

## 2014-07-18 DIAGNOSIS — F419 Anxiety disorder, unspecified: Secondary | ICD-10-CM | POA: Diagnosis not present

## 2014-07-28 ENCOUNTER — Other Ambulatory Visit: Payer: Self-pay | Admitting: Family Medicine

## 2014-07-28 NOTE — Telephone Encounter (Signed)
Med filled.  

## 2014-08-08 ENCOUNTER — Ambulatory Visit (INDEPENDENT_AMBULATORY_CARE_PROVIDER_SITE_OTHER): Payer: Medicare Other | Admitting: Psychiatry

## 2014-08-08 DIAGNOSIS — F419 Anxiety disorder, unspecified: Secondary | ICD-10-CM | POA: Diagnosis not present

## 2014-08-15 DIAGNOSIS — M545 Low back pain: Secondary | ICD-10-CM | POA: Diagnosis not present

## 2014-08-15 DIAGNOSIS — M19031 Primary osteoarthritis, right wrist: Secondary | ICD-10-CM | POA: Diagnosis not present

## 2014-08-21 DIAGNOSIS — R0602 Shortness of breath: Secondary | ICD-10-CM | POA: Diagnosis not present

## 2014-08-21 DIAGNOSIS — I480 Paroxysmal atrial fibrillation: Secondary | ICD-10-CM | POA: Diagnosis not present

## 2014-08-21 DIAGNOSIS — Z7901 Long term (current) use of anticoagulants: Secondary | ICD-10-CM | POA: Diagnosis not present

## 2014-08-24 ENCOUNTER — Encounter: Payer: Self-pay | Admitting: Gastroenterology

## 2014-09-06 ENCOUNTER — Ambulatory Visit (INDEPENDENT_AMBULATORY_CARE_PROVIDER_SITE_OTHER): Payer: Medicare Other | Admitting: Psychiatry

## 2014-09-06 DIAGNOSIS — F419 Anxiety disorder, unspecified: Secondary | ICD-10-CM

## 2014-09-19 ENCOUNTER — Other Ambulatory Visit: Payer: Self-pay | Admitting: Dermatology

## 2014-09-19 DIAGNOSIS — D485 Neoplasm of uncertain behavior of skin: Secondary | ICD-10-CM | POA: Diagnosis not present

## 2014-09-19 DIAGNOSIS — B351 Tinea unguium: Secondary | ICD-10-CM | POA: Diagnosis not present

## 2014-09-19 DIAGNOSIS — L309 Dermatitis, unspecified: Secondary | ICD-10-CM | POA: Diagnosis not present

## 2014-09-19 DIAGNOSIS — L820 Inflamed seborrheic keratosis: Secondary | ICD-10-CM | POA: Diagnosis not present

## 2014-09-25 ENCOUNTER — Ambulatory Visit: Payer: Self-pay | Admitting: Physician Assistant

## 2014-09-26 ENCOUNTER — Other Ambulatory Visit: Payer: Self-pay | Admitting: Obstetrics & Gynecology

## 2014-09-26 DIAGNOSIS — Z6822 Body mass index (BMI) 22.0-22.9, adult: Secondary | ICD-10-CM | POA: Diagnosis not present

## 2014-09-26 DIAGNOSIS — Z124 Encounter for screening for malignant neoplasm of cervix: Secondary | ICD-10-CM | POA: Diagnosis not present

## 2014-09-28 ENCOUNTER — Encounter: Payer: Self-pay | Admitting: Family Medicine

## 2014-09-28 ENCOUNTER — Ambulatory Visit (INDEPENDENT_AMBULATORY_CARE_PROVIDER_SITE_OTHER): Payer: Medicare Other | Admitting: Family Medicine

## 2014-09-28 VITALS — BP 90/60 | HR 85 | Temp 98.7°F | Ht 70.0 in | Wt 158.0 lb

## 2014-09-28 DIAGNOSIS — J45901 Unspecified asthma with (acute) exacerbation: Secondary | ICD-10-CM

## 2014-09-28 DIAGNOSIS — J988 Other specified respiratory disorders: Secondary | ICD-10-CM

## 2014-09-28 LAB — CYTOLOGY - PAP

## 2014-09-28 MED ORDER — AZITHROMYCIN 250 MG PO TABS: ORAL_TABLET | ORAL | Status: DC

## 2014-09-28 MED ORDER — PREDNISONE 10 MG PO TABS: ORAL_TABLET | ORAL | Status: DC

## 2014-09-28 NOTE — Progress Notes (Signed)
Pre visit review using our clinic review tool, if applicable. No additional management support is needed unless otherwise documented below in the visit note. 

## 2014-09-28 NOTE — Progress Notes (Signed)
HPI:  URI: -started: 1 week ago -symptoms:nasal congestion, sore throat, cough - increase sputum, sneezing, chills, chest tightness, SOB -denies:fever, NVD, tooth pain -has tried:  nothing -sick contacts/travel/risks: denies flu exposure, tick exposure or or Ebola risks -reports history of asthma - takes albuterol prn for this Reports tolerates zpaxk  ROS: See pertinent positives and negatives per HPI.  Past Medical History  Diagnosis Date  . Arthritis   . Hyperlipidemia   . 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 Chronic constipation   . Hemorrhoid   . Varicose veins   . Anxiety   . Depression   . GERD (gastroesophageal reflux disease)   . Atrial fibrillation   . Recurrent UTI   . Parkinson disease   . Cardiac arrhythmia due to congenital heart disease   . Mycotic toenails 10/27/2012    Past Surgical History  Procedure Laterality Date  . Appendectomy    . Ileal resection  03/2007    Due to obstruction    Family History  Problem Relation Age of Onset  . Asthma Brother   . Alcohol abuse Brother   . Throat cancer Father   . Alcohol abuse Father   . Lupus Daughter   . Bipolar disorder Daughter   . Lupus Daughter   . Anxiety disorder Daughter   . Alcohol abuse Sister   . Alcohol abuse Maternal Grandfather   . Alcohol abuse Paternal Grandmother     History   Social History  . Marital Status: Widowed    Spouse Name: N/A  . Number of Children: N/A  . Years of Education: N/A   Social History Main Topics  . Smoking status: Former Smoker -- 2.00 packs/day for 30 years    Types: Cigarettes    Quit date: 04/21/1980  . Smokeless tobacco: Never Used     Comment: onset age 46 -47, up to > 1ppd (almost 2 ppd)  . Alcohol Use: No  . Drug Use: No  . Sexual Activity: No   Other Topics Concern  . None   Social History Narrative   Widowed, lives alone. 1 child in [place] and 1 in [place].   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     Current outpatient prescriptions:   .  albuterol (VENTOLIN HFA) 108 (90 BASE) MCG/ACT inhaler, Inhale 2 puffs into the lungs 2 (two) times daily as needed for wheezing or shortness of breath. , Disp: , Rfl:  .  apixaban (ELIQUIS) 5 MG TABS tablet, Take 5 mg by mouth 2 (two) times daily., Disp: , Rfl:  .  Ascorbic Acid (VITAMIN C) 1000 MG tablet, Take 1,000 mg by mouth daily., Disp: , Rfl:  .  Calcium Carb-Cholecalciferol (CALCIUM 1000 + D PO), Take 1,000 mg by mouth daily., Disp: , Rfl:  .  celecoxib (CELEBREX) 100 MG capsule, Take 100 mg by mouth daily as needed (back pain)., Disp: , Rfl:  .  OVER THE COUNTER MEDICATION, Place 1 drop into both eyes 3 (three) times daily as needed (dry eyes). Over the counter eye drops, Disp: , Rfl:  .  sertraline (ZOLOFT) 100 MG tablet, TAKE 1 AND 1/2 TABLETS EVERY DAY, Disp: 135 tablet, Rfl: 3 .  SUPER B COMPLEX/C PO, Take 1 tablet by mouth daily., Disp: , Rfl:  .  trimethoprim (TRIMPEX) 100 MG tablet, TAKE ONE TABLET BY MOUTH NIGHTLY AT BEDTIME, Disp: 90 tablet, Rfl: 1 .  azithromycin (ZITHROMAX) 250 MG tablet, 2 tabs on day one and then 1 tab daily, Disp:  6 tablet, Rfl: 0 .  predniSONE (DELTASONE) 10 MG tablet, '40mg'$  (4 tabs) daily for 2 days, then '30mg'$  (3 tablets) daily for 2 days, then '20mg'$  (2 tablets) daily for 2 days, Disp: 18 tablet, Rfl: 0  EXAM:  Filed Vitals:   09/28/14 1043  BP: 90/60  Pulse: 85  Temp: 98.7 F (37.1 C)    Body mass index is 22.67 kg/(m^2).  GENERAL: vitals reviewed and listed above, alert, oriented, appears well hydrated and in no acute distress  HEENT: atraumatic, conjunttiva clear, no obvious abnormalities on inspection of external nose and ears, normal appearance of ear canals and TMs, clear nasal congestion, mild post oropharyngeal erythema with PND, no tonsillar edema or exudate, no sinus TTP  NECK: no obvious masses on inspection  LUNGS: difuse exp wheezes, no signs resp distress  CV: HRRR, no peripheral edema  MS: moves all extremities without  noticeable abnormality  PSYCH: pleasant and cooperative, no obvious depression or anxiety  ASSESSMENT AND PLAN:  Discussed the following assessment and plan:  Respiratory infection - Plan: azithromycin (ZITHROMAX) 250 MG tablet  Asthma with exacerbation, unspecified asthma severity - Plan: predniSONE (DELTASONE) 10 MG tablet  -We discussed potential etiologies, with resp infection and asthma exacerbation being most likely. Given chills, increased sputum procution and feeling worse instead of better opted for abx and oral steroid. We discussed treatment side effects, likely course, antibiotic misuse, transmission, and signs of developing a serious illness. -of course, we advised to return or notify a doctor immediately if symptoms worsen or persist or new concerns arise. Advised of Saturday urgent care and other options for care over the weekend if needed.    There are no Patient Instructions on file for this visit.   Colin Benton R.

## 2014-09-28 NOTE — Patient Instructions (Signed)
-As we discussed, we have prescribed new medications (azithromycine, an antibiotic and prednisone, a steroid) for you at this appointment. We discussed the common and serious potential adverse effects of this medication and you can review these and more with the pharmacist when you pick up your medication.  Please follow the instructions for use carefully and notify us immediately if you have any problems taking this medication.  -use the albuterol as needed and seek care immediately if any trouble breathing or worsening not responding to treatment  -Please seek care immediately if worsening or not improving as expected  Asthma Asthma is a recurring condition in which the airways tighten and narrow. Asthma can make it difficult to breathe. It can cause coughing, wheezing, and shortness of breath. Asthma episodes, also called asthma attacks, range from minor to life-threatening. Asthma cannot be cured, but medicines and lifestyle changes can help control it. CAUSES Asthma is believed to be caused by inherited (genetic) and environmental factors, but its exact cause is unknown. Asthma may be triggered by allergens, lung infections, or irritants in the air. Asthma triggers are different for each person. Common triggers include:   Animal dander.  Dust mites.  Cockroaches.  Pollen from trees or grass.  Mold.  Smoke.  Air pollutants such as dust, household cleaners, hair sprays, aerosol sprays, paint fumes, strong chemicals, or strong odors.  Cold air, weather changes, and winds (which increase molds and pollens in the air).  Strong emotional expressions such as crying or laughing hard.  Stress.  Certain medicines (such as aspirin) or types of drugs (such as beta-blockers).  Sulfites in foods and drinks. Foods and drinks that may contain sulfites include dried fruit, potato chips, and sparkling grape juice.  Infections or inflammatory conditions such as the flu, a cold, or an  inflammation of the nasal membranes (rhinitis).  Gastroesophageal reflux disease (GERD).  Exercise or strenuous activity. SYMPTOMS Symptoms may occur immediately after asthma is triggered or many hours later. Symptoms include:  Wheezing.  Excessive nighttime or early morning coughing.  Frequent or severe coughing with a common cold.  Chest tightness.  Shortness of breath. DIAGNOSIS  The diagnosis of asthma is made by a review of your medical history and a physical exam. Tests may also be performed. These may include:  Lung function studies. These tests show how much air you breathe in and out.  Allergy tests.  Imaging tests such as X-rays. TREATMENT  Asthma cannot be cured, but it can usually be controlled. Treatment involves identifying and avoiding your asthma triggers. It also involves medicines. There are 2 classes of medicine used for asthma treatment:   Controller medicines. These prevent asthma symptoms from occurring. They are usually taken every day.  Reliever or rescue medicines. These quickly relieve asthma symptoms. They are used as needed and provide short-term relief. Your health care provider will help you create an asthma action plan. An asthma action plan is a written plan for managing and treating your asthma attacks. It includes a list of your asthma triggers and how they may be avoided. It also includes information on when medicines should be taken and when their dosage should be changed. An action plan may also involve the use of a device called a peak flow meter. A peak flow meter measures how well the lungs are working. It helps you monitor your condition. HOME CARE INSTRUCTIONS   Take medicines only as directed by your health care provider. Speak with your health care provider if you  have questions about how or when to take the medicines.  Use a peak flow meter as directed by your health care provider. Record and keep track of readings.  Understand and  use the action plan to help minimize or stop an asthma attack without needing to seek medical care.  Control your home environment in the following ways to help prevent asthma attacks:  Do not smoke. Avoid being exposed to secondhand smoke.  Change your heating and air conditioning filter regularly.  Limit your use of fireplaces and wood stoves.  Get rid of pests (such as roaches and mice) and their droppings.  Throw away plants if you see mold on them.  Clean your floors and dust regularly. Use unscented cleaning products.  Try to have someone else vacuum for you regularly. Stay out of rooms while they are being vacuumed and for a short while afterward. If you vacuum, use a dust mask from a hardware store, a double-layered or microfilter vacuum cleaner bag, or a vacuum cleaner with a HEPA filter.  Replace carpet with wood, tile, or vinyl flooring. Carpet can trap dander and dust.  Use allergy-proof pillows, mattress covers, and box spring covers.  Wash bed sheets and blankets every week in hot water and dry them in a dryer.  Use blankets that are made of polyester or cotton.  Clean bathrooms and kitchens with bleach. If possible, have someone repaint the walls in these rooms with mold-resistant paint. Keep out of the rooms that are being cleaned and painted.  Wash hands frequently. SEEK MEDICAL CARE IF:   You have wheezing, shortness of breath, or a cough even if taking medicine to prevent attacks.  The colored mucus you cough up (sputum) is thicker than usual.  Your sputum changes from clear or white to yellow, green, gray, or bloody.  You have any problems that may be related to the medicines you are taking (such as a rash, itching, swelling, or trouble breathing).  You are using a reliever medicine more than 2-3 times per week.  Your peak flow is still at 50-79% of your personal best after following your action plan for 1 hour.  You have a fever. SEEK IMMEDIATE MEDICAL  CARE IF:   You seem to be getting worse and are unresponsive to treatment during an asthma attack.  You are short of breath even at rest.  You get short of breath when doing very little physical activity.  You have difficulty eating, drinking, or talking due to asthma symptoms.  You develop chest pain.  You develop a fast heartbeat.  You have a bluish color to your lips or fingernails.  You are light-headed, dizzy, or faint.  Your peak flow is less than 50% of your personal best. MAKE SURE YOU:   Understand these instructions.  Will watch your condition.  Will get help right away if you are not doing well or get worse. Document Released: 04/07/2005 Document Revised: 08/22/2013 Document Reviewed: 11/04/2012 [facility] Patient Information 2015 [place], [place]. This information is not intended to replace advice given to you by your health care provider. Make sure you discuss any questions you have with your health care provider.

## 2014-10-03 ENCOUNTER — Telehealth: Payer: Self-pay | Admitting: Family Medicine

## 2014-10-03 NOTE — Telephone Encounter (Signed)
FYI- Patient states she is still having cough and has finished her medications (prednisone and Z-pack).  Her shortness of breath has improved, cough is still very productive.  She has no fevers but feels very tired.  She would like to be seen again for her cough/ asthma exacerbation.    Appointment scheduled with Elyn Aquas, PA 10/04/14.

## 2014-10-03 NOTE — Telephone Encounter (Signed)
Please see below.

## 2014-10-03 NOTE — Telephone Encounter (Signed)
Relation to pt: self  Call back number: [phone]   Reason for call:  Pt states she is completely out of azithromycin (ZITHROMAX) 250 MG tablet and before requesting a refill wanted to inform MD, medication is causing pt to sleep all the time. Next available follow up appointment is not until [facility] 11/08/2014.

## 2014-10-04 ENCOUNTER — Ambulatory Visit (INDEPENDENT_AMBULATORY_CARE_PROVIDER_SITE_OTHER): Payer: Medicare Other | Admitting: Physician Assistant

## 2014-10-04 ENCOUNTER — Ambulatory Visit (HOSPITAL_BASED_OUTPATIENT_CLINIC_OR_DEPARTMENT_OTHER)
Admission: RE | Admit: 2014-10-04 | Discharge: 2014-10-04 | Disposition: A | Discharge status: Home / Self Care | Payer: Medicare Other | Source: Ambulatory Visit | Attending: Physician Assistant | Admitting: Physician Assistant

## 2014-10-04 ENCOUNTER — Encounter: Payer: Self-pay | Admitting: Physician Assistant

## 2014-10-04 VITALS — BP 98/58 | HR 63 | Temp 98.3°F | Resp 24 | Wt 158.2 lb

## 2014-10-04 DIAGNOSIS — R05 Cough: Secondary | ICD-10-CM | POA: Insufficient documentation

## 2014-10-04 DIAGNOSIS — R918 Other nonspecific abnormal finding of lung field: Secondary | ICD-10-CM | POA: Diagnosis not present

## 2014-10-04 DIAGNOSIS — R0989 Other specified symptoms and signs involving the circulatory and respiratory systems: Secondary | ICD-10-CM | POA: Diagnosis not present

## 2014-10-04 DIAGNOSIS — J00 Acute nasopharyngitis [common cold]: Secondary | ICD-10-CM

## 2014-10-04 DIAGNOSIS — J441 Chronic obstructive pulmonary disease with (acute) exacerbation: Secondary | ICD-10-CM

## 2014-10-04 DIAGNOSIS — B9689 Other specified bacterial agents as the cause of diseases classified elsewhere: Secondary | ICD-10-CM

## 2014-10-04 DIAGNOSIS — J208 Acute bronchitis due to other specified organisms: Principal | ICD-10-CM

## 2014-10-04 MED ORDER — DOXYCYCLINE HYCLATE 100 MG PO CAPS: 100.0000 mg | ORAL_CAPSULE | Freq: Two times a day (BID) | ORAL | Status: DC

## 2014-10-04 MED ORDER — IPRATROPIUM-ALBUTEROL 0.5-2.5 (3) MG/3ML IN SOLN
3.0000 mL | Freq: Once | RESPIRATORY_TRACT | Status: AC
  Administered 2014-10-04: 3 mL via RESPIRATORY_TRACT

## 2014-10-04 MED ORDER — PREDNISONE 10 MG PO TABS: ORAL_TABLET | ORAL | Status: DC

## 2014-10-04 NOTE — Addendum Note (Signed)
Addended by: Kelle Darting A on: 10/04/2014 01:17 PM   Modules accepted: Orders

## 2014-10-04 NOTE — Assessment & Plan Note (Signed)
Symptoms persist despite antibiotics. Rx Doxycycline. Supportive measures reviewed. Will obtain CXR to r/o pneumonia.

## 2014-10-04 NOTE — Patient Instructions (Signed)
Please go downstairs for chest x-ray.  I will call you with your results. Please take the antibiotic and steroid as directed. Use saline nasal spray to keep sinuses flushed out. Use your albuterol inhaler every 6 hours if needed for chest tightness of wheeze. Follow-up on Monday.

## 2014-10-04 NOTE — Progress Notes (Signed)
Pre visit review using our clinic review tool, if applicable. No additional management support is needed unless otherwise documented below in the visit note. 

## 2014-10-04 NOTE — Assessment & Plan Note (Signed)
Will extend prednisone taper for wheezing. Duoneb given in office with improvement in symptoms and O2 sats. Encouraged patient to use albuterol inhaler as directed. Follow-up Monday.

## 2014-10-04 NOTE — Progress Notes (Signed)
Patient presents to clinic today c/o continue productive cough, sinus drainage, sinus pain and fatigue.  Denies fever, chills or SOB except when coughing.  Was seen by other provider who started prednisone burst and azithromycin. Patient completed course and endorses wheezing has resolved but productive cough has remained.  Past Medical History  Diagnosis Date  . Arthritis   . Hyperlipidemia   . 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 Chronic constipation   . Hemorrhoid   . Varicose veins   . Anxiety   . Depression   . GERD (gastroesophageal reflux disease)   . Atrial fibrillation   . Recurrent UTI   . Parkinson disease   . Cardiac arrhythmia due to congenital heart disease   . Mycotic toenails 10/27/2012    Current Outpatient Prescriptions on File Prior to Visit  Medication Sig Dispense Refill  . albuterol (VENTOLIN HFA) 108 (90 BASE) MCG/ACT inhaler Inhale 2 puffs into the lungs 2 (two) times daily as needed for wheezing or shortness of breath.     Marland Kitchen apixaban (ELIQUIS) 5 MG TABS tablet Take 5 mg by mouth 2 (two) times daily.    . Ascorbic Acid (VITAMIN C) 1000 MG tablet Take 1,000 mg by mouth daily.    . Calcium Carb-Cholecalciferol (CALCIUM 1000 + D PO) Take 1,000 mg by mouth daily.    . celecoxib (CELEBREX) 100 MG capsule Take 100 mg by mouth daily as needed (back pain).    Marland Kitchen OVER THE COUNTER MEDICATION Place 1 drop into both eyes 3 (three) times daily as needed (dry eyes). Over the counter eye drops    . sertraline (ZOLOFT) 100 MG tablet TAKE 1 AND 1/2 TABLETS EVERY DAY 135 tablet 3  . SUPER B COMPLEX/C PO Take 1 tablet by mouth daily.    Marland Kitchen trimethoprim (TRIMPEX) 100 MG tablet TAKE ONE TABLET BY MOUTH NIGHTLY AT BEDTIME 90 tablet 1   No current facility-administered medications on file prior to visit.    Allergies  Allergen Reactions  . Adhesive [Tape] Other (See Comments)    Caused blisters  . Amoxicillin Other (See Comments)    Caused thrush  . Ciprofloxacin  Other (See Comments)    Caused thrush  . Ultram [Tramadol] Other (See Comments)    hypotension    Family History  Problem Relation Age of Onset  . Asthma Brother   . Alcohol abuse Brother   . Throat cancer Father   . Alcohol abuse Father   . Lupus Daughter   . Bipolar disorder Daughter   . Lupus Daughter   . Anxiety disorder Daughter   . Alcohol abuse Sister   . Alcohol abuse Maternal Grandfather   . Alcohol abuse Paternal Grandmother     History   Social History  . Marital Status: Widowed    Spouse Name: N/A  . Number of Children: N/A  . Years of Education: N/A   Social History Main Topics  . Smoking status: Former Smoker -- 2.00 packs/day for 30 years    Types: Cigarettes    Quit date: 04/21/1980  . Smokeless tobacco: Never Used     Comment: onset age 47 -69, up to > 1ppd (almost 2 ppd)  . Alcohol Use: No  . Drug Use: No  . Sexual Activity: No   Other Topics Concern  . None   Social History Narrative   Widowed, lives alone. 1 child in [place] and 1 in [place].   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   Review  of Systems - See HPI.  All other ROS are negative.  BP 98/58 mmHg  Pulse 63  Temp(Src) 98.3 F (36.8 C) (Oral)  Resp 24  Wt 158 lb 3.2 oz (71.759 kg)  SpO2 92%  Physical Exam  Constitutional: She is oriented to person, place, and time and well-developed, well-nourished, and in no distress.  HENT:  Head: Normocephalic and atraumatic.  Right Ear: External ear normal.  Left Ear: External ear normal.  Nose: Nose normal.  Mouth/Throat: Oropharynx is clear and moist. No oropharyngeal exudate.  TM within normal limits bilaterally.  Eyes: Conjunctivae are normal. Pupils are equal, round, and reactive to light.  Neck: Neck supple.  Cardiovascular: Normal rate, regular rhythm, normal heart sounds and intact distal pulses.   Pulmonary/Chest: Effort normal. She has wheezes. She exhibits no tenderness.  Lymphadenopathy:    She has no cervical adenopathy.    Neurological: She is alert and oriented to person, place, and time.  Skin: Skin is warm and dry. No rash noted.  Psychiatric: Affect normal.  Vitals reviewed.   Recent Results (from the past 2160 hour(s))  Cytology - PAP     Status: None   Collection Time: 09/26/14 12:00 AM  Result Value Ref Range   CYTOLOGY - PAP PAP RESULT     Assessment/Plan: Acute bacterial bronchitis Symptoms persist despite antibiotics. Rx Doxycycline. Supportive measures reviewed. Will obtain CXR to r/o pneumonia.  COPD exacerbation Will extend prednisone taper for wheezing. Duoneb given in office with improvement in symptoms and O2 sats. Encouraged patient to use albuterol inhaler as directed. Follow-up Monday.

## 2014-10-09 ENCOUNTER — Encounter: Payer: Self-pay | Admitting: Physician Assistant

## 2014-10-09 ENCOUNTER — Ambulatory Visit (INDEPENDENT_AMBULATORY_CARE_PROVIDER_SITE_OTHER): Payer: Medicare Other | Admitting: Physician Assistant

## 2014-10-09 VITALS — BP 94/64 | HR 63 | Temp 97.9°F | Resp 16 | Ht 70.0 in | Wt 159.0 lb

## 2014-10-09 DIAGNOSIS — J449 Chronic obstructive pulmonary disease, unspecified: Secondary | ICD-10-CM | POA: Diagnosis not present

## 2014-10-09 DIAGNOSIS — J441 Chronic obstructive pulmonary disease with (acute) exacerbation: Secondary | ICD-10-CM | POA: Diagnosis not present

## 2014-10-09 MED ORDER — BUDESONIDE-FORMOTEROL FUMARATE 160-4.5 MCG/ACT IN AERO: 2.0000 | INHALATION_SPRAY | Freq: Two times a day (BID) | RESPIRATORY_TRACT | Status: DC

## 2014-10-09 NOTE — Assessment & Plan Note (Signed)
Markedly improved. Patient advised to finish antibiotic and steroid pack.  Rx Symbicort to begin for COPD maintenance. Supportive measures reviewed.  Follow-up with PCP in 4-6 weeks for reassessment after starting Symbicort.  Return PRN if anything recurs.

## 2014-10-09 NOTE — Progress Notes (Signed)
Patient presents to clinic today for 1 week follow-up of COPD exacerbation with acute bacterial bronchitis.  Patient endorses taking steroid taper and antibiotic (Doxy) as directed.  Endorses breathing much improved.  Endorses cough is now very mild and non-productive.  Denies fever, chills, chest pain.  Only mild SOB with exertion.  Past Medical History  Diagnosis Date  . Arthritis   . Hyperlipidemia   . 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 Chronic constipation   . Hemorrhoid   . Varicose veins   . Anxiety   . Depression   . GERD (gastroesophageal reflux disease)   . Atrial fibrillation   . Recurrent UTI   . Parkinson disease   . Cardiac arrhythmia due to congenital heart disease   . Mycotic toenails 10/27/2012    Current Outpatient Prescriptions on File Prior to Visit  Medication Sig Dispense Refill  . albuterol (VENTOLIN HFA) 108 (90 BASE) MCG/ACT inhaler Inhale 2 puffs into the lungs 2 (two) times daily as needed for wheezing or shortness of breath.     Marland Kitchen apixaban (ELIQUIS) 5 MG TABS tablet Take 5 mg by mouth 2 (two) times daily.    . Ascorbic Acid (VITAMIN C) 1000 MG tablet Take 1,000 mg by mouth daily.    . Calcium Carb-Cholecalciferol (CALCIUM 1000 + D PO) Take 1,000 mg by mouth daily.    . celecoxib (CELEBREX) 100 MG capsule Take 100 mg by mouth daily as needed (back pain).    Marland Kitchen doxycycline (VIBRAMYCIN) 100 MG capsule Take 1 capsule (100 mg total) by mouth 2 (two) times daily. 14 capsule 0  . OVER THE COUNTER MEDICATION Place 1 drop into both eyes 3 (three) times daily as needed (dry eyes). Over the counter eye drops    . predniSONE (DELTASONE) 10 MG tablet Take 4 tablets by mouth x 2 days, then 3 tablets x 2 days, then 2 tablets x 2 days, then 1 tablet x 2 days. 20 tablet 0  . sertraline (ZOLOFT) 100 MG tablet TAKE 1 AND 1/2 TABLETS EVERY DAY 135 tablet 3  . SUPER B COMPLEX/C PO Take 1 tablet by mouth daily.    Marland Kitchen trimethoprim (TRIMPEX) 100 MG tablet TAKE ONE TABLET BY  MOUTH NIGHTLY AT BEDTIME 90 tablet 1   No current facility-administered medications on file prior to visit.    Allergies  Allergen Reactions  . Adhesive [Tape] Other (See Comments)    Caused blisters  . Amoxicillin Other (See Comments)    Caused thrush  . Ciprofloxacin Other (See Comments)    Caused thrush  . Ultram [Tramadol] Other (See Comments)    hypotension    Family History  Problem Relation Age of Onset  . Asthma Brother   . Alcohol abuse Brother   . Throat cancer Father   . Alcohol abuse Father   . Lupus Daughter   . Bipolar disorder Daughter   . Lupus Daughter   . Anxiety disorder Daughter   . Alcohol abuse Sister   . Alcohol abuse Maternal Grandfather   . Alcohol abuse Paternal Grandmother     History   Social History  . Marital Status: Widowed    Spouse Name: N/A  . Number of Children: N/A  . Years of Education: N/A   Social History Main Topics  . Smoking status: Former Smoker -- 2.00 packs/day for 30 years    Types: Cigarettes    Quit date: 04/21/1980  . Smokeless tobacco: Never Used     Comment: onset  age 74 -44, up to > 1ppd (almost 2 ppd)  . Alcohol Use: No  . Drug Use: No  . Sexual Activity: No   Other Topics Concern  . Not on file   Social History Narrative   Widowed, lives alone. 1 child in [place] and 1 in [place].   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   Review of Systems - See HPI.  All other ROS are negative.  BP 94/64 mmHg  Pulse 63  Temp(Src) 97.9 F (36.6 C) (Oral)  Resp 16  Ht '5\' 10"'$  (1.778 m)  Wt 159 lb (72.122 kg)  BMI 22.81 kg/m2  SpO2 97%  Physical Exam  Constitutional: She is oriented to person, place, and time and well-developed, well-nourished, and in no distress.  HENT:  Head: Normocephalic and atraumatic.  Right Ear: External ear normal.  Left Ear: External ear normal.  Nose: Nose normal.  Mouth/Throat: Oropharynx is clear and moist. No oropharyngeal exudate.  Eyes: Conjunctivae are normal.  Neck: Neck supple.    Cardiovascular: Normal rate, regular rhythm, normal heart sounds and intact distal pulses.   Pulmonary/Chest: Effort normal and breath sounds normal. No respiratory distress. She has no wheezes. She has no rales. She exhibits no tenderness.  Neurological: She is alert and oriented to person, place, and time.  Skin: Skin is warm and dry. No rash noted.  Psychiatric: Affect normal.  Vitals reviewed.   Recent Results (from the past 2160 hour(s))  Cytology - PAP     Status: None   Collection Time: 09/26/14 12:00 AM  Result Value Ref Range   CYTOLOGY - PAP PAP RESULT     Assessment/Plan: COPD exacerbation Markedly improved. Patient advised to finish antibiotic and steroid pack.  Rx Symbicort to begin for COPD maintenance. Supportive measures reviewed.  Follow-up with PCP in 4-6 weeks for reassessment after starting Symbicort.  Return PRN if anything recurs.

## 2014-10-09 NOTE — Patient Instructions (Signed)
I am glad you are feeling better. Please finish the antibiotic and steroid.  Continue Albuterol as needed. Start the Symbicort twice daily as directed.  As long as everything continues to resolve, follow-up with Dr. Birdie Riddle in 1 month to discuss COPD medications.  If anything recurs, come see [place] sooner!

## 2014-10-10 ENCOUNTER — Ambulatory Visit: Payer: Medicare Other | Admitting: Psychiatry

## 2014-10-24 ENCOUNTER — Ambulatory Visit (INDEPENDENT_AMBULATORY_CARE_PROVIDER_SITE_OTHER): Payer: Medicare Other | Admitting: Family Medicine

## 2014-10-24 ENCOUNTER — Encounter: Payer: Self-pay | Admitting: Family Medicine

## 2014-10-24 VITALS — BP 104/66 | HR 71 | Temp 98.1°F | Resp 18 | Ht 70.0 in | Wt 156.4 lb

## 2014-10-24 DIAGNOSIS — J449 Chronic obstructive pulmonary disease, unspecified: Secondary | ICD-10-CM

## 2014-10-24 DIAGNOSIS — R059 Cough, unspecified: Secondary | ICD-10-CM

## 2014-10-24 DIAGNOSIS — R05 Cough: Secondary | ICD-10-CM | POA: Diagnosis not present

## 2014-10-24 MED ORDER — IPRATROPIUM-ALBUTEROL 0.5-2.5 (3) MG/3ML IN SOLN
3.0000 mL | Freq: Once | RESPIRATORY_TRACT | Status: AC
  Administered 2014-10-24: 3 mL via RESPIRATORY_TRACT

## 2014-10-24 MED ORDER — IPRATROPIUM-ALBUTEROL 0.5-2.5 (3) MG/3ML IN SOLN: 3.0000 mL | Freq: Four times a day (QID) | RESPIRATORY_TRACT | Status: DC

## 2014-10-24 NOTE — Patient Instructions (Signed)
Follow up as scheduled for later this month Start the Symbicort twice daily- every day Use the albuterol as needed for increased shortness of breath Drink plenty of fluids REST! Listen to your body.  It is ok to exercise but don't overdo it Call with any questions or concerns Hang in there!!!

## 2014-10-24 NOTE — Progress Notes (Signed)
   Subjective:    Patient ID: [name], female    DOB: 01-26-1928, 81 y.o.   MRN: [id number]  HPI URI- pt was seen on 6/15 and dx'd w/ bronchitis and treated w/ Doxy and prednisone.  Was reassessed on 6/20 and was doing much better.  Pt reports increased fatigue, swollen glands, 'sores in mouth'.  Pt reports sneezing and coughing on Saturday/Sunday.  + chills.  Some intermittent SOB- improves w/ Symbicort use.  Pt is anxious to get back on bike and treadmill.  No known sick contacts.   Review of Systems For ROS see HPI     Objective:   Physical Exam  Constitutional: She appears well-developed and well-nourished. No distress.  HENT:  Head: Normocephalic and atraumatic.  Mouth/Throat: Oropharynx is clear and moist.  TMs normal bilaterally Mild nasal congestion Throat w/out erythema, edema, or exudate- no sores seen  Eyes: Conjunctivae and EOM are normal. Pupils are equal, round, and reactive to light.  Neck: Normal range of motion. Neck supple.  Cardiovascular: Normal rate, regular rhythm, normal heart sounds and intact distal pulses.   No murmur heard. Pulmonary/Chest: Effort normal and breath sounds normal. No respiratory distress. She has no wheezes.  Lymphadenopathy:    She has cervical adenopathy (shotty cervical LAD).  Vitals reviewed.         Assessment & Plan:

## 2014-10-24 NOTE — Progress Notes (Signed)
Pre visit review using our clinic review tool, if applicable. No additional management support is needed unless otherwise documented below in the visit note. 

## 2014-10-26 ENCOUNTER — Ambulatory Visit (INDEPENDENT_AMBULATORY_CARE_PROVIDER_SITE_OTHER): Payer: Medicare Other | Admitting: Psychiatry

## 2014-10-26 DIAGNOSIS — F419 Anxiety disorder, unspecified: Secondary | ICD-10-CM

## 2014-11-02 ENCOUNTER — Ambulatory Visit (INDEPENDENT_AMBULATORY_CARE_PROVIDER_SITE_OTHER): Payer: Medicare Other | Admitting: Podiatry

## 2014-11-02 DIAGNOSIS — B351 Tinea unguium: Secondary | ICD-10-CM

## 2014-11-02 DIAGNOSIS — M79676 Pain in unspecified toe(s): Secondary | ICD-10-CM | POA: Diagnosis not present

## 2014-11-02 NOTE — Progress Notes (Signed)
Patient ID: NURIYAH HANLINE, female   DOB: 02-03-1928, 81 y.o.   MRN: [id number] Complaint:  Visit Type: Patient returns to my office for continued preventative foot care services. Complaint: Patient states" my nails have grown long and thick and become painful to walk and wear shoes" . She presents for preventative foot care services. No changes to ROS  Podiatric Exam: Vascular: dorsalis pedis and posterior tibial pulses are palpable bilateral. Capillary return is immediate. Temperature gradient is WNL. Skin turgor WNL  Sensorium: Normal Semmes Weinstein monofilament test. Normal tactile sensation bilaterally. Nail Exam: Pt has thick disfigured discolored nails with subungual debris noted bilateral entire nail hallux through fifth toenails Ulcer Exam: There is no evidence of ulcer or pre-ulcerative changes or infection. Orthopedic Exam: Muscle tone and strength are WNL. No limitations in general ROM. No crepitus or effusions noted. Foot type and digits show no abnormalities. Bony prominences are unremarkable. Skin: No Porokeratosis. No infection or ulcers  Diagnosis:  Tinea unguium, Pain in right toe, pain in left toes  Treatment & Plan Procedures and Treatment: Consent by patient was obtained for treatment procedures. The patient understood the discussion of treatment and procedures well. All questions were answered thoroughly reviewed. Debridement of mycotic and hypertrophic toenails, 1 through 5 bilateral and clearing of subungual debris. No ulceration, no infection noted.  Return Visit-Office Procedure: Patient instructed to return to the office for a follow up visit 3 months for continued evaluation and treatment.

## 2014-11-05 NOTE — Assessment & Plan Note (Signed)
Reassured pt that she is recovering from her illness and that there is no current sign of infxn.  Stressed the need for her to take her Symbicort regularly as this would improve her SOB and fatigue.  Pt's shotty cervical LAD is likely due to her recent illness and reviewed that this can take weeks to completely resolve.  No need for additional abx.  Pt ok to resume exercise but stressed the need to ease into it.  Reviewed supportive care and red flags that should prompt return.  Pt expressed understanding and is in agreement w/ plan.

## 2014-11-06 DIAGNOSIS — H17823 Peripheral opacity of cornea, bilateral: Secondary | ICD-10-CM | POA: Diagnosis not present

## 2014-11-06 DIAGNOSIS — H04123 Dry eye syndrome of bilateral lacrimal glands: Secondary | ICD-10-CM | POA: Diagnosis not present

## 2014-11-06 DIAGNOSIS — H35372 Puckering of macula, left eye: Secondary | ICD-10-CM | POA: Diagnosis not present

## 2014-11-08 ENCOUNTER — Ambulatory Visit (INDEPENDENT_AMBULATORY_CARE_PROVIDER_SITE_OTHER): Payer: Medicare Other | Admitting: Family Medicine

## 2014-11-08 ENCOUNTER — Encounter: Payer: Self-pay | Admitting: Family Medicine

## 2014-11-08 VITALS — BP 110/70 | HR 66 | Temp 98.0°F | Resp 16 | Ht 70.0 in | Wt 154.5 lb

## 2014-11-08 DIAGNOSIS — J449 Chronic obstructive pulmonary disease, unspecified: Secondary | ICD-10-CM

## 2014-11-08 NOTE — Progress Notes (Signed)
   Subjective:    Patient ID: [name], female    DOB: 22-Jun-1927, 81 y.o.   MRN: [id number]  HPI COPD- pt is using Symbicort twice daily and 'it has helped'.  Pt continues to have dry, nocturnal cough.  Continued PND and hoarseness.  + nasal congestion, drainage   Review of Systems For ROS see HPI     Objective:   Physical Exam  Constitutional: She appears well-developed and well-nourished. No distress.  HENT:  Head: Normocephalic and atraumatic.  Right Ear: Tympanic membrane normal.  Left Ear: Tympanic membrane normal.  Nose: Mucosal edema and rhinorrhea present. Right sinus exhibits no maxillary sinus tenderness and no frontal sinus tenderness. Left sinus exhibits no maxillary sinus tenderness and no frontal sinus tenderness.  Mouth/Throat: Mucous membranes are normal. Posterior oropharyngeal erythema (w/ PND) present.  Eyes: Conjunctivae and EOM are normal. Pupils are equal, round, and reactive to light.  Neck: Normal range of motion. Neck supple.  Cardiovascular: Normal rate, regular rhythm and normal heart sounds.   Pulmonary/Chest: Effort normal and breath sounds normal. No respiratory distress. She has no wheezes. She has no rales.  Lymphadenopathy:    She has no cervical adenopathy.  Vitals reviewed.         Assessment & Plan:

## 2014-11-08 NOTE — Patient Instructions (Signed)
Schedule your complete physical in 3-4 months Continue the Symbicort twice daily.  As symptoms improve, you can try and wean off it.  Restart immediately if your symptoms return Start Claritin or Zyrtec daily for the allergy component Drink plenty of fluids Call with any questions or concerns Enjoy the rest of your summer!!!

## 2014-11-08 NOTE — Progress Notes (Signed)
Pre visit review using our clinic review tool, if applicable. No additional management support is needed unless otherwise documented below in the visit note. 

## 2014-11-09 ENCOUNTER — Ambulatory Visit (INDEPENDENT_AMBULATORY_CARE_PROVIDER_SITE_OTHER): Payer: Medicare Other | Admitting: Psychiatry

## 2014-11-09 DIAGNOSIS — F419 Anxiety disorder, unspecified: Secondary | ICD-10-CM | POA: Diagnosis not present

## 2014-11-12 NOTE — Assessment & Plan Note (Signed)
Improved since starting Symbicort twice daily.  Still having some nocturnal cough, but based on PE today, this is more allergy related.  Pt to increase water intake and start daily OTC antihistamine for better control of sxs.  Will follow.

## 2014-11-27 DIAGNOSIS — I83892 Varicose veins of left lower extremities with other complications: Secondary | ICD-10-CM | POA: Diagnosis not present

## 2014-11-29 ENCOUNTER — Ambulatory Visit (INDEPENDENT_AMBULATORY_CARE_PROVIDER_SITE_OTHER): Payer: Medicare Other | Admitting: Psychiatry

## 2014-11-29 DIAGNOSIS — F419 Anxiety disorder, unspecified: Secondary | ICD-10-CM | POA: Diagnosis not present

## 2014-12-05 ENCOUNTER — Ambulatory Visit (INDEPENDENT_AMBULATORY_CARE_PROVIDER_SITE_OTHER): Payer: Medicare Other | Admitting: Family Medicine

## 2014-12-05 ENCOUNTER — Encounter: Payer: Self-pay | Admitting: Family Medicine

## 2014-12-05 VITALS — BP 134/82 | HR 67 | Temp 97.9°F | Resp 16 | Wt 155.4 lb

## 2014-12-05 DIAGNOSIS — N390 Urinary tract infection, site not specified: Secondary | ICD-10-CM | POA: Diagnosis not present

## 2014-12-05 DIAGNOSIS — R82998 Other abnormal findings in urine: Secondary | ICD-10-CM

## 2014-12-05 DIAGNOSIS — R1030 Lower abdominal pain, unspecified: Secondary | ICD-10-CM

## 2014-12-05 LAB — POCT URINALYSIS DIPSTICK
Bilirubin, UA: NEGATIVE
Blood, UA: NEGATIVE
Glucose, UA: NEGATIVE
KETONES UA: NEGATIVE
Nitrite, UA: NEGATIVE
PH UA: 5.5
PROTEIN UA: NEGATIVE
SPEC GRAV UA: 1.025
UROBILINOGEN UA: 0.2

## 2014-12-05 LAB — CBC WITH DIFFERENTIAL/PLATELET
Basophils Absolute: 0 10*3/uL (ref 0.0–0.1)
Basophils Relative: 0.4 % (ref 0.0–3.0)
EOS ABS: 0.1 10*3/uL (ref 0.0–0.7)
Eosinophils Relative: 2 % (ref 0.0–5.0)
HCT: 36.1 % (ref 36.0–46.0)
Hemoglobin: 11.9 g/dL — ABNORMAL LOW (ref 12.0–15.0)
LYMPHS ABS: 1.7 10*3/uL (ref 0.7–4.0)
Lymphocytes Relative: 25.9 % (ref 12.0–46.0)
MCHC: 33 g/dL (ref 30.0–36.0)
MCV: 91 fl (ref 78.0–100.0)
MONO ABS: 0.6 10*3/uL (ref 0.1–1.0)
Monocytes Relative: 9.3 % (ref 3.0–12.0)
NEUTROS PCT: 62.4 % (ref 43.0–77.0)
Neutro Abs: 4 10*3/uL (ref 1.4–7.7)
Platelets: 244 10*3/uL (ref 150.0–400.0)
RBC: 3.96 Mil/uL (ref 3.87–5.11)
RDW: 16.4 % — ABNORMAL HIGH (ref 11.5–15.5)
WBC: 6.4 10*3/uL (ref 4.0–10.5)

## 2014-12-05 LAB — BASIC METABOLIC PANEL
BUN: 15 mg/dL (ref 6–23)
CHLORIDE: 100 meq/L (ref 96–112)
CO2: 31 mEq/L (ref 19–32)
Calcium: 9.4 mg/dL (ref 8.4–10.5)
Creatinine, Ser: 0.85 mg/dL (ref 0.40–1.20)
GFR: 67.58 mL/min (ref >60.00)
Glucose, Bld: 75 mg/dL (ref 70–99)
Potassium: 4.2 mEq/L (ref 3.5–5.1)
Sodium: 135 mEq/L (ref 135–145)

## 2014-12-05 MED ORDER — CEPHALEXIN 500 MG PO CAPS: 500.0000 mg | ORAL_CAPSULE | Freq: Two times a day (BID) | ORAL | Status: AC

## 2014-12-05 NOTE — Patient Instructions (Signed)
Follow up as needed- particularly if no improvement We'll notify you of your lab results and make any changes if needed Start the Keflex twice daily for what seems to be a urinary tract infection Drink plenty of fluids If no improvement in pain, please let me know Call with any questions or concerns Hang in there!

## 2014-12-05 NOTE — Assessment & Plan Note (Signed)
Recurrent issue for pt.  No fever, vomiting or red flags on hx or PE.  TTP over bladder.  Hx of recurrent UTIs.  Has seen GI, GYN, urology.  Start keflex for presumed UTI.  Check labs to r/o infxn or other underlying cause.  Reviewed supportive care and red flags that should prompt return.  Pt expressed understanding and is in agreement w/ plan.

## 2014-12-05 NOTE — Progress Notes (Signed)
   Subjective:    Patient ID: [name], female    DOB: 1927-12-03, 81 y.o.   MRN: [id number]  HPI abd pain- pain started 'a couple of weeks ago'.  Was initially 'mild' but has worsened.  Pain will radiate across to central abdomen.  Pain described as a burning.  'i think it's all related to my bowels and intestines'.  Pt has hx of 'back problem for 30-40 yrs'.  Pt has no appendix.  No nausea/vomiting.  No changes in bowel habits.  Denies dysuria, frequency.  No fevers/chills.   Review of Systems For ROS see HPI     Objective:   Physical Exam  Constitutional: She is oriented to person, place, and time. She appears well-developed and well-nourished. No distress.  HENT:  Head: Normocephalic and atraumatic.  Abdominal: Soft. Bowel sounds are normal. She exhibits no distension. There is tenderness (TTP over bladder, no RLQ TTP). There is no rebound and no guarding.  Neurological: She is alert and oriented to person, place, and time.  Skin: Skin is warm and dry.  Psychiatric: She has a normal mood and affect. Her behavior is normal. Thought content normal.  Vitals reviewed.         Assessment & Plan:

## 2014-12-05 NOTE — Progress Notes (Signed)
Pre visit review using our clinic review tool, if applicable. No additional management support is needed unless otherwise documented below in the visit note. 

## 2014-12-06 ENCOUNTER — Encounter: Payer: Self-pay | Admitting: General Practice

## 2014-12-07 LAB — URINE CULTURE
COLONY COUNT: NO GROWTH
Organism ID, Bacteria: NO GROWTH

## 2014-12-27 ENCOUNTER — Telehealth: Payer: Self-pay | Admitting: Family Medicine

## 2014-12-27 ENCOUNTER — Ambulatory Visit (HOSPITAL_BASED_OUTPATIENT_CLINIC_OR_DEPARTMENT_OTHER)
Admission: RE | Admit: 2014-12-27 | Discharge: 2014-12-27 | Disposition: A | Discharge status: Home / Self Care | Payer: Medicare Other | Source: Ambulatory Visit | Attending: Family Medicine | Admitting: Family Medicine

## 2014-12-27 ENCOUNTER — Ambulatory Visit (HOSPITAL_BASED_OUTPATIENT_CLINIC_OR_DEPARTMENT_OTHER): Payer: Medicare Other

## 2014-12-27 DIAGNOSIS — R109 Unspecified abdominal pain: Secondary | ICD-10-CM | POA: Diagnosis not present

## 2014-12-27 DIAGNOSIS — R1084 Generalized abdominal pain: Secondary | ICD-10-CM

## 2014-12-27 DIAGNOSIS — I77811 Abdominal aortic ectasia: Secondary | ICD-10-CM | POA: Diagnosis not present

## 2014-12-27 DIAGNOSIS — I83892 Varicose veins of left lower extremities with other complications: Secondary | ICD-10-CM | POA: Diagnosis not present

## 2014-12-27 NOTE — Telephone Encounter (Signed)
Please advise, no record of this in last OV note,

## 2014-12-27 NOTE — Telephone Encounter (Signed)
[facility] for abd [place] complete, dx abd pain

## 2014-12-27 NOTE — Telephone Encounter (Signed)
Order placed

## 2014-12-27 NOTE — Telephone Encounter (Signed)
Relation to VG:KKDP Call back [phone]   Reason for call:  Patient states is stomach pain did not improve PCP would order U/S. Please advise

## 2014-12-28 ENCOUNTER — Other Ambulatory Visit: Payer: Self-pay | Admitting: Family Medicine

## 2014-12-28 DIAGNOSIS — K839 Disease of biliary tract, unspecified: Secondary | ICD-10-CM

## 2015-01-01 DIAGNOSIS — I83812 Varicose veins of left lower extremities with pain: Secondary | ICD-10-CM | POA: Diagnosis not present

## 2015-01-01 DIAGNOSIS — I83892 Varicose veins of left lower extremities with other complications: Secondary | ICD-10-CM | POA: Diagnosis not present

## 2015-01-09 ENCOUNTER — Other Ambulatory Visit (INDEPENDENT_AMBULATORY_CARE_PROVIDER_SITE_OTHER): Payer: Medicare Other

## 2015-01-09 ENCOUNTER — Ambulatory Visit (INDEPENDENT_AMBULATORY_CARE_PROVIDER_SITE_OTHER): Payer: Medicare Other | Admitting: Gastroenterology

## 2015-01-09 ENCOUNTER — Encounter: Payer: Self-pay | Admitting: Gastroenterology

## 2015-01-09 VITALS — BP 98/58 | HR 72 | Ht 70.0 in | Wt 152.0 lb

## 2015-01-09 DIAGNOSIS — R197 Diarrhea, unspecified: Secondary | ICD-10-CM | POA: Diagnosis not present

## 2015-01-09 DIAGNOSIS — R1031 Right lower quadrant pain: Secondary | ICD-10-CM

## 2015-01-09 DIAGNOSIS — R634 Abnormal weight loss: Secondary | ICD-10-CM

## 2015-01-09 LAB — BUN: BUN: 11 mg/dL (ref 6–23)

## 2015-01-09 LAB — CREATININE, SERUM: Creatinine, Ser: 0.94 mg/dL (ref 0.40–1.20)

## 2015-01-09 NOTE — Patient Instructions (Addendum)
Your physician has requested that you go to the basement for the following lab work before leaving today:BUN/Creat  You have been scheduled for a CT scan of the abdomen and pelvis at [facility] ([address]---this is in the same building as [facility]).   You are scheduled on 01-19-2015 at 1030am. You should arrive 15 minutes prior to your appointment time for registration. Please follow the written instructions below on the day of your exam:  WARNING: IF YOU ARE ALLERGIC TO IODINE/X-RAY DYE, PLEASE NOTIFY RADIOLOGY IMMEDIATELY AT [phone]! YOU WILL BE GIVEN A 13 HOUR PREMEDICATION PREP.  1) Do not eat or drink anything after 630am (4 hours prior to your test) 2) You have been given 2 bottles of oral contrast to drink. The solution may taste better if refrigerated, but do NOT add ice or any other liquid to this solution. Shake well before drinking.    Drink 1 bottle of contrast @ 830am (2 hours prior to your exam)  Drink 1 bottle of contrast @ 930am (1 hour prior to your exam)  You may take any medications as prescribed with a small amount of water except for the following: Metformin, Glucophage, Glucovance, Avandamet, Riomet, Fortamet, Actoplus Met, Janumet, Glumetza or Metaglip. The above medications must be held the day of the exam AND 48 hours after the exam.  The purpose of you drinking the oral contrast is to aid in the visualization of your intestinal tract. The contrast solution may cause some diarrhea. Before your exam is started, you will be given a small amount of fluid to drink. Depending on your individual set of symptoms, you may also receive an intravenous injection of x-ray contrast/dye. Plan on being at [facility] for 30 minutes or long, depending on the type of exam you are having performed.  This test typically takes 30-45 minutes to complete.  If you have any questions regarding your exam or if you need to reschedule, you may call the CT  department at [phone] between the hours of 8:00 am and 5:00 pm, Monday-Friday.  ________________________________________________________________________ Thank you for choosing me and [facility].  Pricilla Riffle. Dagoberto Ligas., MD., [facility]

## 2015-01-09 NOTE — Progress Notes (Signed)
    History of Present Illness: This is an 81 year old female referred by Midge Minium, MD for the evaluation of right lower quadrant pain, an abnormal abdominal ultrasound and weight loss. Patient notes frequent low back pain and burning right lower quadrant pain for the past few months. Her back pain and right lower quadrant pain improve with exercise and movement. Occasionally she notes her right lower quadrant pain is worsened with bowel movements. She underwent colonoscopy in 2011 for follow-up of colon polyps which was normal for small internal hemorrhoids. She has a history of IBS-D which is stable with ongoing diarrhea. She notes a weight loss of about 5 pounds. Mild normocytic anemia noted recent CBC, Hb=11.9. Chem 7 was normal. LFTs in 06/2014 were normal. Denies constipation, diarrhea, change in stool caliber, melena, hematochezia, nausea, vomiting, dysphagia, reflux symptoms, chest pain.  ABD US IMPRESSION: 1. No acute findings in the abdomen. No cholelithiasis or evidence of acute cholecystitis. 2. Chronic prominence of the intra- and extrahepatic biliary tree appears stable since 2013. 3. Ectatic abdominal aorta at risk for aneurysm development. Recommend followup by ultrasound in 5 years.   Review of Systems: Pertinent positive and negative review of systems were noted in the above HPI section. All other review of systems were otherwise negative.  Current Medications, Allergies, Past Medical History, Past Surgical History, Family History and Social History were reviewed in [employer] record.  Physical Exam: General: Well developed, well nourished, no acute distress Head: Normocephalic and atraumatic Eyes:  sclerae anicteric, EOMI Ears: Normal auditory acuity Mouth: No deformity or lesions Neck: Supple, no masses or thyromegaly Lungs: Clear throughout to auscultation Heart: Regular rate and rhythm; no murmurs, rubs or bruits Abdomen: Soft, non tender  and non distended. No masses, hepatosplenomegaly or hernias noted. Normal Bowel sounds Musculoskeletal: Symmetrical with no gross deformities  Skin: No lesions on visible extremities Pulses:  Normal pulses noted Extremities: No clubbing, cyanosis, edema or deformities noted Neurological: Alert oriented x 4, grossly nonfocal Cervical Nodes:  No significant cervical adenopathy Inguinal Nodes: No significant inguinal adenopathy Psychological:  Alert and cooperative. Normal mood and affect  Assessment and Recommendations:  1. Right lower quadrant pain, associated with back pain, bowel movements and weight loss. Rule out gastrointestinal etiologies. This could be IBS related or musculoskeletal. Schedule abdominal/pelvic CT. Consider a trial of anti-spasmodics after CT scan is completed and reviewed.  2. Abnormal abdominal ultrasound. Chronic mildly enlarged main pancreatic duct appears stable on recent US imaging. Chronically enlarged bile duct on prior studies measures 6 mm on most recent US imaging which is improved. LFTs normal in 06/2014. Abdominal/pelvic CT as above.  3. IBS-D. Trial of a daily probiotic and Imodium twice a day as needed. Consider a trial of Xifaxan and trial of FODMAP diet   cc: Midge Minium, MD [address] [place], [place] [address]

## 2015-01-17 ENCOUNTER — Ambulatory Visit (HOSPITAL_BASED_OUTPATIENT_CLINIC_OR_DEPARTMENT_OTHER)
Admission: RE | Admit: 2015-01-17 | Discharge: 2015-01-17 | Disposition: A | Discharge status: Home / Self Care | Payer: Medicare Other | Source: Ambulatory Visit | Attending: Internal Medicine | Admitting: Internal Medicine

## 2015-01-17 ENCOUNTER — Ambulatory Visit: Payer: Medicare Other | Admitting: Psychiatry

## 2015-01-17 ENCOUNTER — Ambulatory Visit (INDEPENDENT_AMBULATORY_CARE_PROVIDER_SITE_OTHER): Payer: Medicare Other | Admitting: Internal Medicine

## 2015-01-17 ENCOUNTER — Encounter: Payer: Self-pay | Admitting: Internal Medicine

## 2015-01-17 VITALS — BP 118/74 | HR 66 | Temp 97.7°F | Ht 70.0 in | Wt 153.0 lb

## 2015-01-17 DIAGNOSIS — J449 Chronic obstructive pulmonary disease, unspecified: Secondary | ICD-10-CM | POA: Diagnosis not present

## 2015-01-17 DIAGNOSIS — J441 Chronic obstructive pulmonary disease with (acute) exacerbation: Secondary | ICD-10-CM | POA: Diagnosis not present

## 2015-01-17 DIAGNOSIS — R05 Cough: Secondary | ICD-10-CM | POA: Insufficient documentation

## 2015-01-17 DIAGNOSIS — J189 Pneumonia, unspecified organism: Secondary | ICD-10-CM | POA: Diagnosis not present

## 2015-01-17 LAB — CBC WITH DIFFERENTIAL/PLATELET
BASOS PCT: 0.5 % (ref 0.0–3.0)
Basophils Absolute: 0 10*3/uL (ref 0.0–0.1)
EOS PCT: 2.7 % (ref 0.0–5.0)
Eosinophils Absolute: 0.2 10*3/uL (ref 0.0–0.7)
HCT: 39.2 % (ref 36.0–46.0)
HEMOGLOBIN: 12.8 g/dL (ref 12.0–15.0)
LYMPHS ABS: 1.7 10*3/uL (ref 0.7–4.0)
Lymphocytes Relative: 29.1 % (ref 12.0–46.0)
MCHC: 32.6 g/dL (ref 30.0–36.0)
MCV: 91.6 fl (ref 78.0–100.0)
MONO ABS: 0.6 10*3/uL (ref 0.1–1.0)
Monocytes Relative: 9.9 % (ref 3.0–12.0)
NEUTROS ABS: 3.4 10*3/uL (ref 1.4–7.7)
Neutrophils Relative %: 57.8 % (ref 43.0–77.0)
PLATELETS: 217 10*3/uL (ref 150.0–400.0)
RBC: 4.28 Mil/uL (ref 3.87–5.11)
RDW: 15.9 % — AB (ref 11.5–15.5)
WBC: 5.8 10*3/uL (ref 4.0–10.5)

## 2015-01-17 MED ORDER — PREDNISONE 10 MG PO TABS: ORAL_TABLET | ORAL | Status: DC

## 2015-01-17 NOTE — Patient Instructions (Addendum)
Get your blood work before you leave   Stop by the first floor and get the XR   Symbicort twice a day every day Prednisone for a few days Albuterol up to 4 times a day as needed for wheezing or cough Mucinex DM twice a day for 10 days then as needed Start : Flonase OTC sprays in each side of the nose every day OTC Prilosec 20 mg one tablet before breakfast  Schedule an appointment to see Dr. Birdie Riddle in 2 weeks, call if you are not gradually improving

## 2015-01-17 NOTE — Progress Notes (Signed)
Pre visit review using our clinic review tool, if applicable. No additional management support is needed unless otherwise documented below in the visit note. 

## 2015-01-17 NOTE — Progress Notes (Signed)
Subjective:    Patient ID: [name], female    DOB: 20-Nov-1927, 81 y.o.   MRN: [id number]  DOS:  01/17/2015 Type of visit - description :  Acute visit Interval history: Patient came back acutely, having respiratory symptoms on and off for several months. Chart reviewed 09/28/2014: Had a COPD exacerbation, prescribed Z-Pak and prednisone 10/04/2014: No better, chest x-ray showed pneumonia, prescribed doxycycline 10/09/2014 - reported Improvement, started Symbicort  10/24/2014: Recovering well 11/08/2014 still coughing, allergies? Prescribed antihistaminic's  Again continue with feeling tired, chest congested, occasionally sputum production on-off Chills?. Denies fevers Uses Symbicort as needed, no daily  Review of Systems  No chest pain, has shortness of breath ---> better w/ inhalers. No lower extremity edema She is anticoagulated, denies hemoptysis, blood in the stools or gross hematuria. Continue with wheezing on and off. Having acid reflux symptoms sometimes.   Past Medical History  Diagnosis Date  . Arthritis   . Hyperlipidemia   . 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 Chronic constipation   . Hemorrhoid   . Varicose veins   . Anxiety   . Depression   . GERD (gastroesophageal reflux disease)   . Atrial fibrillation   . Recurrent UTI   . Parkinson disease   . Cardiac arrhythmia due to congenital heart disease   . Mycotic toenails 10/27/2012    Past Surgical History  Procedure Laterality Date  . Appendectomy    . Ileal resection  03/2007    Due to obstruction    Social History   Social History  . Marital Status: Widowed    Spouse Name: N/A  . Number of Children: N/A  . Years of Education: N/A   Occupational History  . Not on file.   Social History Main Topics  . Smoking status: Former Smoker -- 2.00 packs/day for 30 years    Types: Cigarettes    Quit date: 04/21/1980  . Smokeless tobacco: Never Used     Comment: onset age 8 -61, up  to > 1ppd (almost 2 ppd)  . Alcohol Use: No  . Drug Use: No  . Sexual Activity: No   Other Topics Concern  . Not on file   Social History Narrative   Widowed, lives alone. 1 child in [place] and 1 in [place].   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        Medication List       This list is accurate as of: 01/17/15 11:59 PM.  Always use your most recent med list.               apixaban 5 MG Tabs tablet  Commonly known as:  ELIQUIS  Take 5 mg by mouth 2 (two) times daily.     budesonide-formoterol 160-4.5 MCG/ACT inhaler  Commonly known as:  SYMBICORT  Inhale 2 puffs into the lungs 2 (two) times daily.     CALCIUM 1000 + D PO  Take 1,000 mg by mouth daily.     celecoxib 100 MG capsule  Commonly known as:  CELEBREX  Take 100 mg by mouth daily as needed (back pain).     omeprazole 20 MG capsule  Commonly known as:  PRILOSEC  Take 20 mg by mouth daily.     OVER THE COUNTER MEDICATION  Place 1 drop into both eyes 3 (three) times daily as needed (dry eyes). Over the counter eye drops     predniSONE 10 MG tablet  Commonly known as:  DELTASONE  4  tablets x 2 days, 3 tabs x 2 days, 2 tabs x 2 days, 1 tab x 2 days     sertraline 100 MG tablet  Commonly known as:  ZOLOFT  TAKE 1 AND 1/2 TABLETS EVERY DAY     SUPER B COMPLEX/C PO  Take 1 tablet by mouth daily.     trimethoprim 100 MG tablet  Commonly known as:  TRIMPEX  TAKE ONE TABLET BY MOUTH NIGHTLY AT BEDTIME     VENTOLIN HFA 108 (90 BASE) MCG/ACT inhaler  Generic drug:  albuterol  Inhale 2 puffs into the lungs 2 (two) times daily as needed for wheezing or shortness of breath.     vitamin C 1000 MG tablet  Take 1,000 mg by mouth daily.           Objective:   Physical Exam BP 118/74 mmHg  Pulse 66  Temp(Src) 97.7 F (36.5 C) (Oral)  Ht '5\' 10"'$  (1.778 m)  Wt 153 lb (69.4 kg)  BMI 21.95 kg/m2  SpO2 97% General:   Well developed, well nourished . NAD.  HEENT:  Normocephalic . Face symmetric, atraumatic. TMs:  Unable to see due to narrow canals. No discharge. Nose not congested, sinuses not TTP. Throat symmetric Lungs:  Few wheezes, some rhonchi. No respiratory effort, no intercostal retractions, no accessory muscle use. Heart: RRR,  no murmur.  No pretibial edema bilaterally  Skin: Not pale. Not jaundice Neurologic:  alert & oriented X3.  Speech normal, gait appropriate for age and unassisted Psych--  Cognition and judgment appear intact.  Cooperative with normal attention span and concentration.  Behavior appropriate. No anxious or depressed appearing.      Assessment & Plan:   COPD Patient with COPD not feeling well from the respiratory standpoint for the last few months. Pneumonia 09-2014 and appropriately treated Plan: CBC, chest x-ray Symbicort twice a day, albuterol as needed Flonase daily GERD with PPIs See PCP in 2 weeks

## 2015-01-18 ENCOUNTER — Telehealth: Payer: Self-pay

## 2015-01-18 NOTE — Telephone Encounter (Signed)
-----   Message from Midge Minium, MD sent at 01/17/2015 10:57 AM EDT ----- Regarding: RE: Transfer Care No recommendations but I know it is limited as to who is taking new pts.  I think anyone over there would take excellent care of her.  Thanks! kt ----- Message -----    From: Wilfrid Lund, CMA    Sent: 01/17/2015  10:40 AM      To: Midge Minium, MD Subject: Transfer Care                                  Hate to bother you, but your Pt, Ms. Templin is here today for an acute with Dr. Larose Kells and was letting me know that she probably will not go with you to [facility] due to it being to far. She is interested in going to [facility] but didn't know if you recommended any providers there for her.    Thanks.  KF

## 2015-01-18 NOTE — Telephone Encounter (Signed)
Tried calling Pt, busy tone, no answer.

## 2015-01-19 ENCOUNTER — Ambulatory Visit (INDEPENDENT_AMBULATORY_CARE_PROVIDER_SITE_OTHER)
Admission: RE | Admit: 2015-01-19 | Discharge: 2015-01-19 | Disposition: A | Discharge status: Home / Self Care | Payer: Medicare Other | Source: Ambulatory Visit | Attending: Gastroenterology | Admitting: Gastroenterology

## 2015-01-19 DIAGNOSIS — R1031 Right lower quadrant pain: Secondary | ICD-10-CM

## 2015-01-19 DIAGNOSIS — R109 Unspecified abdominal pain: Secondary | ICD-10-CM | POA: Diagnosis not present

## 2015-01-19 MED ORDER — IOHEXOL 300 MG/ML  SOLN
100.0000 mL | Freq: Once | INTRAMUSCULAR | Status: AC | PRN
  Administered 2015-01-19: 100 mL via INTRAVENOUS

## 2015-01-19 NOTE — Telephone Encounter (Signed)
Letter printed and mailed to Pt regarding her chest x-ray results. Also informed her of Dr. Virgil Benedict recommendations on providers at the [facility] location. Provided their office number in letter.

## 2015-01-22 ENCOUNTER — Telehealth: Payer: Self-pay | Admitting: Gastroenterology

## 2015-01-22 NOTE — Telephone Encounter (Signed)
See results for additional details.  

## 2015-01-29 ENCOUNTER — Other Ambulatory Visit: Payer: Self-pay | Admitting: Family Medicine

## 2015-01-30 NOTE — Telephone Encounter (Signed)
Medication filled to pharmacy as requested.   

## 2015-01-31 ENCOUNTER — Observation Stay (HOSPITAL_COMMUNITY)
Admission: EM | Admit: 2015-01-31 | Discharge: 2015-02-02 | Disposition: A | Discharge status: Home / Self Care | Payer: Medicare Other | Attending: Internal Medicine | Admitting: Internal Medicine

## 2015-01-31 ENCOUNTER — Encounter (HOSPITAL_COMMUNITY): Payer: Self-pay | Admitting: Emergency Medicine

## 2015-01-31 ENCOUNTER — Emergency Department (HOSPITAL_COMMUNITY): Payer: Medicare Other

## 2015-01-31 DIAGNOSIS — Z87440 Personal history of urinary (tract) infections: Secondary | ICD-10-CM | POA: Diagnosis not present

## 2015-01-31 DIAGNOSIS — Z885 Allergy status to narcotic agent status: Secondary | ICD-10-CM | POA: Insufficient documentation

## 2015-01-31 DIAGNOSIS — R1030 Lower abdominal pain, unspecified: Secondary | ICD-10-CM

## 2015-01-31 DIAGNOSIS — R42 Dizziness and giddiness: Secondary | ICD-10-CM

## 2015-01-31 DIAGNOSIS — B9689 Other specified bacterial agents as the cause of diseases classified elsewhere: Secondary | ICD-10-CM

## 2015-01-31 DIAGNOSIS — K589 Irritable bowel syndrome without diarrhea: Secondary | ICD-10-CM

## 2015-01-31 DIAGNOSIS — R05 Cough: Secondary | ICD-10-CM

## 2015-01-31 DIAGNOSIS — J208 Acute bronchitis due to other specified organisms: Secondary | ICD-10-CM

## 2015-01-31 DIAGNOSIS — Z7901 Long term (current) use of anticoagulants: Secondary | ICD-10-CM | POA: Insufficient documentation

## 2015-01-31 DIAGNOSIS — J40 Bronchitis, not specified as acute or chronic: Secondary | ICD-10-CM

## 2015-01-31 DIAGNOSIS — J309 Allergic rhinitis, unspecified: Secondary | ICD-10-CM | POA: Insufficient documentation

## 2015-01-31 DIAGNOSIS — Z87891 Personal history of nicotine dependence: Secondary | ICD-10-CM | POA: Diagnosis not present

## 2015-01-31 DIAGNOSIS — R0602 Shortness of breath: Secondary | ICD-10-CM | POA: Diagnosis not present

## 2015-01-31 DIAGNOSIS — J069 Acute upper respiratory infection, unspecified: Secondary | ICD-10-CM | POA: Diagnosis not present

## 2015-01-31 DIAGNOSIS — I482 Chronic atrial fibrillation: Secondary | ICD-10-CM | POA: Diagnosis not present

## 2015-01-31 DIAGNOSIS — M25551 Pain in right hip: Secondary | ICD-10-CM

## 2015-01-31 DIAGNOSIS — J441 Chronic obstructive pulmonary disease with (acute) exacerbation: Secondary | ICD-10-CM

## 2015-01-31 DIAGNOSIS — R7989 Other specified abnormal findings of blood chemistry: Secondary | ICD-10-CM | POA: Diagnosis not present

## 2015-01-31 DIAGNOSIS — R748 Abnormal levels of other serum enzymes: Secondary | ICD-10-CM | POA: Insufficient documentation

## 2015-01-31 DIAGNOSIS — E785 Hyperlipidemia, unspecified: Secondary | ICD-10-CM | POA: Diagnosis not present

## 2015-01-31 DIAGNOSIS — J449 Chronic obstructive pulmonary disease, unspecified: Secondary | ICD-10-CM | POA: Diagnosis not present

## 2015-01-31 DIAGNOSIS — R778 Other specified abnormalities of plasma proteins: Secondary | ICD-10-CM

## 2015-01-31 DIAGNOSIS — M7918 Myalgia, other site: Secondary | ICD-10-CM

## 2015-01-31 DIAGNOSIS — R000 Tachycardia, unspecified: Secondary | ICD-10-CM | POA: Diagnosis not present

## 2015-01-31 DIAGNOSIS — R197 Diarrhea, unspecified: Secondary | ICD-10-CM

## 2015-01-31 DIAGNOSIS — Z79899 Other long term (current) drug therapy: Secondary | ICD-10-CM | POA: Insufficient documentation

## 2015-01-31 DIAGNOSIS — R413 Other amnesia: Secondary | ICD-10-CM

## 2015-01-31 DIAGNOSIS — Z86010 Personal history of colonic polyps: Secondary | ICD-10-CM

## 2015-01-31 DIAGNOSIS — Z888 Allergy status to other drugs, medicaments and biological substances status: Secondary | ICD-10-CM | POA: Insufficient documentation

## 2015-01-31 DIAGNOSIS — Z23 Encounter for immunization: Secondary | ICD-10-CM | POA: Insufficient documentation

## 2015-01-31 DIAGNOSIS — Z8701 Personal history of pneumonia (recurrent): Secondary | ICD-10-CM | POA: Diagnosis not present

## 2015-01-31 DIAGNOSIS — I708 Atherosclerosis of other arteries: Secondary | ICD-10-CM | POA: Insufficient documentation

## 2015-01-31 DIAGNOSIS — I480 Paroxysmal atrial fibrillation: Secondary | ICD-10-CM | POA: Diagnosis not present

## 2015-01-31 DIAGNOSIS — B351 Tinea unguium: Secondary | ICD-10-CM

## 2015-01-31 DIAGNOSIS — R058 Other specified cough: Secondary | ICD-10-CM

## 2015-01-31 DIAGNOSIS — Z881 Allergy status to other antibiotic agents status: Secondary | ICD-10-CM | POA: Insufficient documentation

## 2015-01-31 DIAGNOSIS — F411 Generalized anxiety disorder: Secondary | ICD-10-CM

## 2015-01-31 DIAGNOSIS — R069 Unspecified abnormalities of breathing: Secondary | ICD-10-CM | POA: Diagnosis not present

## 2015-01-31 DIAGNOSIS — K219 Gastro-esophageal reflux disease without esophagitis: Secondary | ICD-10-CM | POA: Diagnosis not present

## 2015-01-31 DIAGNOSIS — G20 Parkinson's disease: Secondary | ICD-10-CM | POA: Diagnosis not present

## 2015-01-31 DIAGNOSIS — I4891 Unspecified atrial fibrillation: Secondary | ICD-10-CM | POA: Diagnosis not present

## 2015-01-31 DIAGNOSIS — R062 Wheezing: Secondary | ICD-10-CM | POA: Diagnosis not present

## 2015-01-31 DIAGNOSIS — R5383 Other fatigue: Secondary | ICD-10-CM

## 2015-01-31 DIAGNOSIS — N390 Urinary tract infection, site not specified: Secondary | ICD-10-CM

## 2015-01-31 LAB — CBC WITH DIFFERENTIAL/PLATELET
Basophils Absolute: 0 10*3/uL (ref 0.0–0.1)
Basophils Relative: 0 %
EOS ABS: 0.2 10*3/uL (ref 0.0–0.7)
Eosinophils Relative: 2 %
HEMATOCRIT: 39.5 % (ref 36.0–46.0)
HEMOGLOBIN: 12.9 g/dL (ref 12.0–15.0)
LYMPHS ABS: 2.4 10*3/uL (ref 0.7–4.0)
LYMPHS PCT: 29 %
MCH: 30.4 pg (ref 26.0–34.0)
MCHC: 32.7 g/dL (ref 30.0–36.0)
MCV: 93.2 fL (ref 78.0–100.0)
MONOS PCT: 8 %
Monocytes Absolute: 0.7 10*3/uL (ref 0.1–1.0)
NEUTROS PCT: 61 %
Neutro Abs: 5 10*3/uL (ref 1.7–7.7)
Platelets: 239 10*3/uL (ref 150–400)
RBC: 4.24 MIL/uL (ref 3.87–5.11)
RDW: 15.4 % (ref 11.5–15.5)
WBC: 8.2 10*3/uL (ref 4.0–10.5)

## 2015-01-31 LAB — PROTIME-INR
INR: 1.33 (ref 0.00–1.49)
PROTHROMBIN TIME: 16.6 s — AB (ref 11.6–15.2)

## 2015-01-31 LAB — COMPREHENSIVE METABOLIC PANEL
ALK PHOS: 65 U/L (ref 38–126)
ALT: 7 U/L — ABNORMAL LOW (ref 14–54)
ANION GAP: 8 (ref 5–15)
AST: 31 U/L (ref 15–41)
Albumin: 3.5 g/dL (ref 3.5–5.0)
BILIRUBIN TOTAL: 1.3 mg/dL — AB (ref 0.3–1.2)
BUN: 12 mg/dL (ref 6–20)
CALCIUM: 8.9 mg/dL (ref 8.9–10.3)
CO2: 27 mmol/L (ref 22–32)
Chloride: 101 mmol/L (ref 101–111)
Creatinine, Ser: 1.01 mg/dL — ABNORMAL HIGH (ref 0.44–1.00)
GFR calc Af Amer: 57 mL/min — ABNORMAL LOW (ref >60)
GFR calc non Af Amer: 49 mL/min — ABNORMAL LOW (ref >60)
GLUCOSE: 106 mg/dL — AB (ref 65–99)
Potassium: 5 mmol/L (ref 3.5–5.1)
SODIUM: 136 mmol/L (ref 135–145)
TOTAL PROTEIN: 6.2 g/dL — AB (ref 6.5–8.1)

## 2015-01-31 LAB — BRAIN NATRIURETIC PEPTIDE: B Natriuretic Peptide: 398.6 pg/mL — ABNORMAL HIGH (ref 0.0–100.0)

## 2015-01-31 LAB — TROPONIN I
TROPONIN I: 0.1 ng/mL — AB (ref <0.031)
Troponin I: 0.11 ng/mL — ABNORMAL HIGH (ref <0.031)
Troponin I: 0.09 ng/mL — ABNORMAL HIGH (ref <0.031)

## 2015-01-31 MED ORDER — ENOXAPARIN SODIUM 40 MG/0.4ML ~~LOC~~ SOLN: 40.0000 mg | SUBCUTANEOUS | Status: DC

## 2015-01-31 MED ORDER — ACETAMINOPHEN 650 MG RE SUPP: 650.0000 mg | Freq: Four times a day (QID) | RECTAL | Status: DC | PRN

## 2015-01-31 MED ORDER — SODIUM CHLORIDE 0.9 % IV SOLN
INTRAVENOUS | Status: DC
  Administered 2015-01-31: 20:00:00 via INTRAVENOUS

## 2015-01-31 MED ORDER — ONDANSETRON HCL 4 MG/2ML IJ SOLN: 4.0000 mg | Freq: Four times a day (QID) | INTRAMUSCULAR | Status: DC | PRN

## 2015-01-31 MED ORDER — DILTIAZEM HCL 25 MG/5ML IV SOLN
5.0000 mg | Freq: Once | INTRAVENOUS | Status: AC
  Administered 2015-01-31: 5 mg via INTRAVENOUS
  Filled 2015-01-31: qty 5

## 2015-01-31 MED ORDER — IPRATROPIUM-ALBUTEROL 0.5-2.5 (3) MG/3ML IN SOLN
3.0000 mL | Freq: Once | RESPIRATORY_TRACT | Status: AC
Start: 2015-01-31 — End: 2015-01-31
  Administered 2015-01-31: 3 mL via RESPIRATORY_TRACT
  Filled 2015-01-31: qty 3

## 2015-01-31 MED ORDER — BUDESONIDE-FORMOTEROL FUMARATE 160-4.5 MCG/ACT IN AERO
2.0000 | INHALATION_SPRAY | Freq: Two times a day (BID) | RESPIRATORY_TRACT | Status: DC
  Administered 2015-02-01 – 2015-02-02 (×4): 2 via RESPIRATORY_TRACT
  Filled 2015-01-31 (×2): qty 6

## 2015-01-31 MED ORDER — SERTRALINE HCL 50 MG PO TABS
150.0000 mg | ORAL_TABLET | Freq: Every day | ORAL | Status: DC
  Administered 2015-02-01 – 2015-02-02 (×2): 150 mg via ORAL
  Filled 2015-01-31 (×6): qty 1

## 2015-01-31 MED ORDER — ACETAMINOPHEN 325 MG PO TABS
650.0000 mg | ORAL_TABLET | Freq: Four times a day (QID) | ORAL | Status: DC | PRN
  Administered 2015-02-02: 650 mg via ORAL
  Filled 2015-01-31: qty 2

## 2015-01-31 MED ORDER — PANTOPRAZOLE SODIUM 40 MG PO TBEC
40.0000 mg | DELAYED_RELEASE_TABLET | Freq: Every day | ORAL | Status: DC
  Administered 2015-01-31 – 2015-02-02 (×3): 40 mg via ORAL
  Filled 2015-01-31 (×4): qty 1

## 2015-01-31 MED ORDER — ONDANSETRON HCL 4 MG PO TABS: 4.0000 mg | ORAL_TABLET | Freq: Four times a day (QID) | ORAL | Status: DC | PRN

## 2015-01-31 MED ORDER — ASPIRIN 81 MG PO CHEW
324.0000 mg | CHEWABLE_TABLET | Freq: Once | ORAL | Status: AC
  Administered 2015-01-31: 324 mg via ORAL
  Filled 2015-01-31: qty 4

## 2015-01-31 MED ORDER — SODIUM CHLORIDE 0.9 % IJ SOLN
3.0000 mL | Freq: Two times a day (BID) | INTRAMUSCULAR | Status: DC
  Administered 2015-01-31 – 2015-02-02 (×2): 3 mL via INTRAVENOUS

## 2015-01-31 MED ORDER — APIXABAN 5 MG PO TABS
5.0000 mg | ORAL_TABLET | Freq: Two times a day (BID) | ORAL | Status: DC
  Administered 2015-01-31 – 2015-02-02 (×4): 5 mg via ORAL
  Filled 2015-01-31 (×5): qty 1

## 2015-01-31 MED ORDER — IPRATROPIUM-ALBUTEROL 0.5-2.5 (3) MG/3ML IN SOLN
3.0000 mL | Freq: Four times a day (QID) | RESPIRATORY_TRACT | Status: DC | PRN
  Administered 2015-02-01: 3 mL via RESPIRATORY_TRACT
  Filled 2015-01-31: qty 3

## 2015-01-31 NOTE — ED Notes (Signed)
Pt states she has had congestion and shortness of breath that was gotten progressively worse over the last 2 days. Has been using Symbicort and home and albuterol was expired. Pt states sob is worse on exertion. Denies any chest pain. Reports a non productive cough. Was treated for pneumonia in June. Pt has 10 albuterol and '5mg'$  Atrovent, '125mg'$  solumedrol by ems.

## 2015-01-31 NOTE — ED Notes (Signed)
MD at bedside.Rancour

## 2015-01-31 NOTE — ED Provider Notes (Signed)
CSN: [id number]     Arrival date & time 01/31/15  1155 History   First MD Initiated Contact with Patient 01/31/15 1208     Chief Complaint  Patient presents with  . Shortness of Breath     (Consider location/radiation/quality/duration/timing/severity/associated sxs/prior Treatment) HPI Comments:  Patient from home with difficulty breathing, cough, congestion, shortness of breath for the past 2 days. States she's not been feeling well since she had pneumonia in June. History of COPD using Symbicort at home and her albuterol has been expired so she is not been using it. Shortness of breath is worse on exertion associated with palpitations. History of atrial fibrillation and [place]. Cough productive of yellow mucus. Received albuterol, Atrovent and Solu-Medrol by EMS. EKG is atrial fibrillation rate controlled 110. She denies any significant chest pain only with pain with coughing. She denies any fever , leg swelling or sick contacts. She did not receive a flu shot.  Patient is a 81 y.o. female presenting with shortness of breath. The history is provided by the EMS personnel and the patient.  Shortness of Breath Associated symptoms: no abdominal pain, no chest pain, no fever, no headaches and no vomiting     Past Medical History  Diagnosis Date  . Arthritis   . Hyperlipidemia   . 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 Chronic constipation   . Hemorrhoid   . Varicose veins   . Anxiety   . Depression   . GERD (gastroesophageal reflux disease)   . Atrial fibrillation ([place])   . Recurrent UTI   . Parkinson disease ([place])   . Cardiac arrhythmia due to congenital heart disease   . Mycotic toenails 10/27/2012   Past Surgical History  Procedure Laterality Date  . Appendectomy    . Ileal resection  03/2007    Due to obstruction   Family History  Problem Relation Age of Onset  . Asthma Brother   . Alcohol abuse Brother   . Throat cancer Father   . Alcohol abuse Father   . Lupus  Daughter   . Bipolar disorder Daughter   . Lupus Daughter   . Anxiety disorder Daughter   . Alcohol abuse Sister   . Alcohol abuse Maternal Grandfather   . Alcohol abuse Paternal Grandmother    Social History  Substance Use Topics  . Smoking status: Former Smoker -- 2.00 packs/day for 30 years    Types: Cigarettes    Quit date: 04/21/1980  . Smokeless tobacco: Never Used     Comment: onset age 51 -78, up to > 1ppd (almost 2 ppd)  . Alcohol Use: No   OB History    No data available     Review of Systems  Constitutional: Positive for activity change and appetite change. Negative for fever.  HENT: Positive for congestion and rhinorrhea.   Respiratory: Positive for chest tightness and shortness of breath.   Cardiovascular: Negative for chest pain.  Gastrointestinal: Negative for nausea, vomiting, abdominal pain and abdominal distention.  Genitourinary: Negative for dysuria and hematuria.  Musculoskeletal: Negative for myalgias and arthralgias.  Skin: Negative for wound.  Neurological: Negative for dizziness, weakness, light-headedness and headaches.  A complete 10 system review of systems was obtained and all systems are negative except as noted in the HPI and PMH.      Allergies  Adhesive; Amoxicillin; Ciprofloxacin; and Ultram  Home Medications   Prior to Admission medications   Medication Sig Start Date End Date Taking? Authorizing Provider  albuterol (VENTOLIN  HFA) 108 (90 BASE) MCG/ACT inhaler Inhale 2 puffs into the lungs 2 (two) times daily as needed for wheezing or shortness of breath.    Yes Historical Provider, MD  apixaban (ELIQUIS) 5 MG TABS tablet Take 5 mg by mouth 2 (two) times daily.   Yes Historical Provider, MD  Ascorbic Acid (VITAMIN C) 1000 MG tablet Take 1,000 mg by mouth daily.   Yes Historical Provider, MD  budesonide-formoterol (SYMBICORT) 160-4.5 MCG/ACT inhaler Inhale 2 puffs into the lungs 2 (two) times daily. 10/09/14  Yes Brunetta Jeans, PA-C   Calcium Carb-Cholecalciferol (CALCIUM 1000 + D PO) Take 1,000 mg by mouth daily.   Yes Historical Provider, MD  celecoxib (CELEBREX) 100 MG capsule Take 100 mg by mouth daily as needed (back pain).   Yes Historical Provider, MD  omeprazole (PRILOSEC) 20 MG capsule Take 20 mg by mouth daily.   Yes Historical Provider, MD  OVER THE COUNTER MEDICATION Place 1 drop into both eyes 3 (three) times daily as needed (dry eyes). Over the counter eye drops   Yes Historical Provider, MD  sertraline (ZOLOFT) 100 MG tablet Take 150 mg by mouth daily.   Yes Historical Provider, MD  SUPER B COMPLEX/C PO Take 1 tablet by mouth daily.   Yes Historical Provider, MD   BP 111/72 mmHg  Pulse 114  Temp(Src) 98 F (36.7 C) (Oral)  Resp 23  Ht '5\' 10"'$  (1.778 m)  Wt 152 lb (68.947 kg)  BMI 21.81 kg/m2  SpO2 95% Physical Exam  Constitutional: She is oriented to person, place, and time. She appears well-developed and well-nourished. No distress.  HENT:  Head: Normocephalic and atraumatic.  Mouth/Throat: Oropharynx is clear and moist. No oropharyngeal exudate.  Coarse cough  Eyes: Conjunctivae and EOM are normal. Pupils are equal, round, and reactive to light.  Neck: Normal range of motion. Neck supple.  No meningismus.  Cardiovascular: Normal rate, normal heart sounds and intact distal pulses.   No murmur heard. Irregular rhythm  Pulmonary/Chest: Effort normal. No respiratory distress. She has wheezes.  Abdominal: Soft. There is no tenderness. There is no rebound and no guarding.  Musculoskeletal: Normal range of motion. She exhibits no edema or tenderness.  Neurological: She is alert and oriented to person, place, and time. No cranial nerve deficit. She exhibits normal muscle tone. Coordination normal.  No ataxia on finger to nose bilaterally. No pronator drift. 5/5 strength throughout. CN 2-12 intact. Negative Romberg. Equal grip strength. Sensation intact. Gait is normal.   Skin: Skin is warm.   Psychiatric: She has a normal mood and affect. Her behavior is normal.  Nursing note and vitals reviewed.   ED Course  Procedures (including critical care time) Labs Review Labs Reviewed  COMPREHENSIVE METABOLIC PANEL - Abnormal; Notable for the following:    Glucose, Bld 106 (*)    Creatinine, Ser 1.01 (*)    Total Protein 6.2 (*)    ALT 7 (*)    Total Bilirubin 1.3 (*)    GFR calc non Af Amer 49 (*)    GFR calc Af Amer 57 (*)    All other components within normal limits  TROPONIN I - Abnormal; Notable for the following:    Troponin I 0.11 (*)    All other components within normal limits  BRAIN NATRIURETIC PEPTIDE - Abnormal; Notable for the following:    B Natriuretic Peptide 398.6 (*)    All other components within normal limits  PROTIME-INR - Abnormal; Notable for the following:  Prothrombin Time 16.6 (*)    All other components within normal limits  CBC WITH DIFFERENTIAL/PLATELET  URINALYSIS, ROUTINE W REFLEX MICROSCOPIC (NOT AT [facility])  TROPONIN I  TROPONIN I    Imaging Review Dg Chest 2 View  01/31/2015  CLINICAL DATA:  Cough. Shortness of breath. COPD. Atrial fibrillation. Parkinson's disease. EXAM: CHEST  2 VIEW COMPARISON:  01/17/2015 FINDINGS: Atherosclerotic aortic arch. Borderline enlargement of the cardiopericardial silhouette. No edema. Subsegmental atelectasis or scarring along the left hemidiaphragm. No significant pleural effusion. IMPRESSION: 1. Subsegmental atelectasis along the left hemidiaphragm. 2. Borderline enlargement of the cardiopericardial silhouette, without edema. 3. Atherosclerotic aortic arch. Electronically Signed   By: Van Clines M.D.   On: 01/31/2015 12:59   I have personally reviewed and evaluated these images and lab results as part of my medical decision-making.   EKG Interpretation   Date/Time:  Wednesday January 31 2015 12:03:16 EDT Ventricular Rate:  93 PR Interval:    QRS Duration: 79 QT Interval:  345 QTC  Calculation: 429 R Axis:   65 Text Interpretation:  Atrial fibrillation RSR' in V1 or V2, probably  normal variant Baseline wander in lead(s) III Atrial fibrillation  Confirmed by Wyvonnia Dusky  MD, Brieonna Crutcher ([id number]) on 01/31/2015 12:30:35 PM      MDM   Final diagnoses:  Atrial fibrillation with RVR (HCC)  Elevated troponin  Bronchitis  SOB with cough x 2 days.  Some chest pain with coughing.  Afib rate controlled 100-110s.  Lungs clear, no fever. Nebs and steroids given  CXR clear.  Troponin elevated.  Suspect viral syndrome causing rapid afib and troponin leak.  D/w Dr. Einar Gip who recommends medical admission.  He agrees with cardizem for rate control and trending of troponins. Dyspneic with exertion, sats to 90%. Cardizem bolus given for rapid afib Admission d/w Nevin Bloodgood NP.  Ezequiel Essex, MD 01/31/15 1730

## 2015-01-31 NOTE — ED Notes (Addendum)
Pt ambulated to the restroom on pulse oximetry sats remained above 90% however pt became extremely short of breath and could not talk in complete sentences. Pt assisted back into bed. Dr. Wyvonnia Dusky made aware.

## 2015-01-31 NOTE — ED Notes (Signed)
The patient is unable to give an urine specimen at this time. The tech has reported to the RN in charge. 

## 2015-01-31 NOTE — H&P (Signed)
[facility] History and Physical  [name] [name]:[id number] DOB: October 07, 1927 DOA: 01/31/2015  Referring physician: Emergency Department PCP: Annye Asa, MD   CHIEF COMPLAINT: SOB, cough  HPI: [name] is a 81 y.o. female with COPD and atrial fibrillation on Eloquis presenting to ED with SOB, productive cough, and general malaise. Symptoms actually started in June when patient was diagnosed with right middle lobe pneumonia and treated with Azithromycin. Patient has seen PCP a few times since June because of recurrent symptoms. His been treated with inhalers, guaifenesin, and other over-the-counter meds for symptom management.  Patient admits to not using Symbicort on a regular basis . She gets hot at times but hasn't checked for fevers. No chest pain. Patient complains of nausea over the last week, her appetite is poor. She has lost 8 pounds over the last few months. Overall she just feels poor.   ED COURSE: In ED patient is afebrile, she presented with mild tachycardia at 114 but in afib with HR intermittently rising into the 130's.  Troponin mildly elevated at 0.11. ED spoke with patient's Cardiologist (Dr. Einar Gip) who will see patient.   Her 02 sats are normal. No acute findings on CXR  EKG: Atrial fibrillation RSR in V1 or V2, probably normal variant Baseline wander in lead(s) III Vent. rate 93 BPM PR interval * ms QRS duration 79 ms QT/QTc 345/429 ms P-R-T axes -1 65 64  Medications  ipratropium-albuterol (DUONEB) 0.5-2.5 (3) MG/3ML nebulizer solution 3 mL (3 mLs Nebulization Given 01/31/15 1308)  aspirin chewable tablet 324 mg (324 mg Oral Given 01/31/15 1412)  diltiazem (CARDIZEM) injection 5 mg (5 mg Intravenous Given 01/31/15 1436)    REVIEW OF SYSTEMS:  Constitutional:  No night sweats, ++ fatigue.  HEENT:  No headaches, difficulty swallowing, tooth/dental problems, sore throat.  No sneezing, itching, ear ache, nasal congestion, post nasal  drip,  Cardio-vascular:  No chest pain, orthopnea, PND, swelling in lower extremities, anasarca, dizziness, ++ palpitations.  GI:  No heartburn, indigestion, abdominal pain, ++ nausea, diarrhea, change in bowel habits, ++ loss of appetite  Resp:  ++ shortness of breath with exertion or at rest. No excess mucus,++productive cough, No coughing up of blood .No wheezing.No chest wall deformity  Skin:  no rash or lesions.  GU:  no dysuria, change in color of urine, no urgency or frequency. No flank pain.  Musculoskeletal:  No joint pain or swelling. No decreased range of motion. No back pain.  Psych:  No change in mood or affect. No depression or anxiety. No memory loss.   Past Medical History  Diagnosis Date  . Arthritis   . Hyperlipidemia   . 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 Chronic constipation   . Hemorrhoid   . Varicose veins   . Anxiety   . Depression   . GERD (gastroesophageal reflux disease)   . Atrial fibrillation ([place])   . Recurrent UTI   . Parkinson disease ([place])   . Cardiac arrhythmia due to congenital heart disease   . Mycotic toenails 10/27/2012   Past Surgical History  Procedure Laterality Date  . Appendectomy    . Ileal resection  03/2007    Due to obstruction   Social History:  reports that she quit smoking about 34 years ago. Her smoking use included Cigarettes. She has a 60 pack-year smoking history. She has never used smokeless tobacco. She reports that she does not drink alcohol or use illicit drugs. Lives:   At home With:  Alone with cats   Assistive devices:   None for ambulation.   Allergies  Allergen Reactions  . Adhesive [Tape] Other (See Comments)    Caused blisters  . Amoxicillin Other (See Comments)    Caused thrush  . Ciprofloxacin Other (See Comments)    Caused thrush  . Ultram [Tramadol] Other (See Comments)    hypotension    Family History  Problem Relation Age of Onset  . Asthma Brother   . Alcohol abuse Brother   .  Throat cancer Father   . Alcohol abuse Father   . Lupus Daughter   . Bipolar disorder Daughter   . Lupus Daughter   . Anxiety disorder Daughter   . Alcohol abuse Sister   . Alcohol abuse Maternal Grandfather   . Alcohol abuse Paternal Grandmother      Prior to Admission medications   Medication Sig Start Date End Date Taking? Authorizing Provider  albuterol (VENTOLIN HFA) 108 (90 BASE) MCG/ACT inhaler Inhale 2 puffs into the lungs 2 (two) times daily as needed for wheezing or shortness of breath.    Yes Historical Provider, MD  apixaban (ELIQUIS) 5 MG TABS tablet Take 5 mg by mouth 2 (two) times daily.   Yes Historical Provider, MD  Ascorbic Acid (VITAMIN C) 1000 MG tablet Take 1,000 mg by mouth daily.   Yes Historical Provider, MD  budesonide-formoterol (SYMBICORT) 160-4.5 MCG/ACT inhaler Inhale 2 puffs into the lungs 2 (two) times daily. 10/09/14  Yes Brunetta Jeans, PA-C  Calcium Carb-Cholecalciferol (CALCIUM 1000 + D PO) Take 1,000 mg by mouth daily.   Yes Historical Provider, MD  celecoxib (CELEBREX) 100 MG capsule Take 100 mg by mouth daily as needed (back pain).   Yes Historical Provider, MD  omeprazole (PRILOSEC) 20 MG capsule Take 20 mg by mouth daily.   Yes Historical Provider, MD  OVER THE COUNTER MEDICATION Place 1 drop into both eyes 3 (three) times daily as needed (dry eyes). Over the counter eye drops   Yes Historical Provider, MD  sertraline (ZOLOFT) 100 MG tablet Take 150 mg by mouth daily.   Yes Historical Provider, MD  SUPER B COMPLEX/C PO Take 1 tablet by mouth daily.   Yes Historical Provider, MD   PHYSICAL EXAM: Filed Vitals:   01/31/15 1215 01/31/15 1330 01/31/15 1400 01/31/15 1445  BP: 111/71 93/55 110/75 97/78  Pulse: 101 108 136 117  Temp:      TempSrc:      Resp: '17 24 25 16  '$ Height:      Weight:      SpO2: 98% 100% 97% 88%    Wt Readings from Last 3 Encounters:  01/31/15 68.947 kg (152 lb)  01/17/15 69.4 kg (153 lb)  01/09/15 68.947 kg (152 lb)     General:  Pleasant white  female. Appears calm and comfortable Eyes: PER, normal lids, irises & conjunctiva ENT: grossly normal hearing, lips & tongue Neck: no LAD, masses or thyromegaly Cardiovascular: sinus tachycardia.  No LE edema.  Respiratory: Respirations even and unlabored. Normal respiratory effort. Lungs CTA bilaterally, no wheezes / rales .   Abdomen: soft, non-distended, non-tender, active bowel sounds. No obvious masses.  Skin: no rash seen on limited exam Musculoskeletal: grossly normal tone BUE/BLE Psychiatric: grossly normal mood and affect, speech fluent and appropriate Neurologic: grossly non-focal.         Labs on Admission:  Basic Metabolic Panel:  Recent Labs Lab 01/31/15 1240  NA 136  K 5.0  CL 101  CO2 27  GLUCOSE 106*  BUN 12  CREATININE 1.01*  CALCIUM 8.9   Liver Function Tests:  Recent Labs Lab 01/31/15 1240  AST 31  ALT 7*  ALKPHOS 65  BILITOT 1.3*  PROT 6.2*  ALBUMIN 3.5    CBC:  Recent Labs Lab 01/31/15 1240  WBC 8.2  NEUTROABS 5.0  HGB 12.9  HCT 39.5  MCV 93.2  PLT 239   Cardiac Enzymes:  Recent Labs Lab 01/31/15 1240  TROPONINI 0.11*    BNP (last 3 results)  Recent Labs  01/31/15 1240  BNP 398.6*    Radiological Exams on Admission: Dg Chest 2 View  01/31/2015  CLINICAL DATA:  Cough. Shortness of breath. COPD. Atrial fibrillation. Parkinson's disease. EXAM: CHEST  2 VIEW COMPARISON:  01/17/2015 FINDINGS: Atherosclerotic aortic arch. Borderline enlargement of the cardiopericardial silhouette. No edema. Subsegmental atelectasis or scarring along the left hemidiaphragm. No significant pleural effusion. IMPRESSION: 1. Subsegmental atelectasis along the left hemidiaphragm. 2. Borderline enlargement of the cardiopericardial silhouette, without edema. 3. Atherosclerotic aortic arch. Electronically Signed   By: Van Clines M.D.   On: 01/31/2015 12:59   ASSESSMENT / PLAN   Productive cough (green-yellow) and  shortness of breath. Frequent symptoms since June when diagnosed with right middle lobe PNA. Treated with Azithromycin, inhalers, expectorants but hasn't felt much improvement. CXR today without acute findings.  -Will admit to observation (telemetry) -IV fluids -Symbicort, prn nebulizer for COPD -repeat CXR in am to make sure this isn't developing PNA -Will hold off on antibiotics for now with normal WBC, lungs CTA.  -check for influenza -Initiate antibiotics if not quickly showing improvement.   Atrial fibrillation on Eliquis.  -continuous cardiac monitoring.  -HR intermittently in 130's. Got IV Cardizem in ED. Cardiology to evaluate.   Elevated troponin, 0.11. Will cycle troponins. No chest pain.   COPD. Doubt acute exacerbations.  Continue home Symbicort, prn nebulizer.   Consultants- Cardiology  Code Status: full DVT Prophylaxis: Lovenox Family Communication: Patient alert, oriented and understands plan of care.  Disposition Plan: Discharge to home in 24-48 hours.    Time spent: 60 minutes Tye Savoy  NP [facility] Pager [phone]

## 2015-01-31 NOTE — ED Notes (Signed)
Attempted to call report. Palma Holter RN unavailable

## 2015-02-01 ENCOUNTER — Ambulatory Visit: Payer: Medicare Other | Admitting: Family Medicine

## 2015-02-01 ENCOUNTER — Observation Stay (HOSPITAL_COMMUNITY): Payer: Medicare Other

## 2015-02-01 DIAGNOSIS — R7989 Other specified abnormal findings of blood chemistry: Secondary | ICD-10-CM | POA: Diagnosis not present

## 2015-02-01 DIAGNOSIS — R002 Palpitations: Secondary | ICD-10-CM | POA: Diagnosis not present

## 2015-02-01 DIAGNOSIS — J438 Other emphysema: Secondary | ICD-10-CM

## 2015-02-01 DIAGNOSIS — I4891 Unspecified atrial fibrillation: Secondary | ICD-10-CM | POA: Diagnosis not present

## 2015-02-01 DIAGNOSIS — R0602 Shortness of breath: Secondary | ICD-10-CM | POA: Diagnosis not present

## 2015-02-01 DIAGNOSIS — I480 Paroxysmal atrial fibrillation: Secondary | ICD-10-CM | POA: Diagnosis not present

## 2015-02-01 DIAGNOSIS — R05 Cough: Secondary | ICD-10-CM | POA: Diagnosis not present

## 2015-02-01 LAB — URINALYSIS, ROUTINE W REFLEX MICROSCOPIC
Bilirubin Urine: NEGATIVE
GLUCOSE, UA: NEGATIVE mg/dL
Hgb urine dipstick: NEGATIVE
KETONES UR: NEGATIVE mg/dL
LEUKOCYTES UA: NEGATIVE
Nitrite: NEGATIVE
PROTEIN: NEGATIVE mg/dL
Specific Gravity, Urine: 1.014 (ref 1.005–1.030)
UROBILINOGEN UA: 0.2 mg/dL (ref 0.0–1.0)
pH: 6 (ref 5.0–8.0)

## 2015-02-01 LAB — MAGNESIUM: Magnesium: 1.9 mg/dL (ref 1.7–2.4)

## 2015-02-01 LAB — BASIC METABOLIC PANEL
Anion gap: 5 (ref 5–15)
BUN: 11 mg/dL (ref 6–20)
CHLORIDE: 107 mmol/L (ref 101–111)
CO2: 25 mmol/L (ref 22–32)
CREATININE: 0.86 mg/dL (ref 0.44–1.00)
Calcium: 8.9 mg/dL (ref 8.9–10.3)
GFR calc Af Amer: >60 mL/min (ref >60)
GFR, EST NON AFRICAN AMERICAN: 60 mL/min — AB (ref >60)
GLUCOSE: 95 mg/dL (ref 65–99)
POTASSIUM: 3.9 mmol/L (ref 3.5–5.1)
Sodium: 137 mmol/L (ref 135–145)

## 2015-02-01 LAB — INFLUENZA PANEL BY PCR (TYPE A & B)
H1N1FLUPCR: NOT DETECTED
INFLAPCR: NEGATIVE
Influenza B By PCR: NEGATIVE

## 2015-02-01 LAB — TSH: TSH: 2.815 u[IU]/mL (ref 0.350–4.500)

## 2015-02-01 MED ORDER — GUAIFENESIN-DM 100-10 MG/5ML PO SYRP
5.0000 mL | ORAL_SOLUTION | ORAL | Status: DC | PRN
  Administered 2015-02-01: 5 mL via ORAL
  Filled 2015-02-01: qty 5

## 2015-02-01 MED ORDER — DILTIAZEM HCL 30 MG PO TABS
30.0000 mg | ORAL_TABLET | Freq: Four times a day (QID) | ORAL | Status: DC
  Administered 2015-02-01 (×2): 30 mg via ORAL
  Filled 2015-02-01 (×2): qty 1

## 2015-02-01 NOTE — Progress Notes (Signed)
8:59am BP is low 85/53 ; comparative BP is 79/53 as charted in vital signs . Pt is asymptomatic primary nurse made aware. Continue monitoring pt

## 2015-02-01 NOTE — Progress Notes (Signed)
PROGRESS NOTE  [name] WNU:[id number] DOB: 03/14/1928 DOA: 01/31/2015 PCP: Annye Asa, MD  Brief history 81 year old female with a history of COPD, paroxysmal fibrillation, hyperlipidemia, Parkinson's disease presented with 3-4 day history of worsening shortness of breath, coughing, and generalized malaise. The patient states that she is chronically short of breath but has been worse over the past 4 days, particularly with dyspnea on exertion. In addition, the patient has been feeling palpitations and her heart racing. The patient has subjective fevers and chills. She denies any hemoptysis, nausea, vomiting, hematochezia, melena. The patient has diagnosis of irritable bowel syndrome which has caused her to have loose stools almost on a daily basis. She denies any chest discomfort, dizziness, syncope. In the emergency department, the patient was noted to have atrial fibrillation with RVR with a heart rate up to 120. Assessment/Plan: Atrial fibrillation with RVR -Rate control is borderline at this point with heart rate 100-110 -The patient was not on any rate controlling agent prior to admission -Suspect that the patient's COPD may have contributed to her RVR -start diltiazem po -Echo -TSH -Continue apixaban Elevated troponin -No symptoms of angina -Secondary to patient's tachycardia -EKG without concerning ischemic changes COPD -Compensated -Restart Symbicort -Continue duo nebs -Patient quit smoking 30 years ago, but had approximate 60-pack-year history Dyspnea and cough -Worsening likely due to the patient's atrial fib relation with RVR although certainly the patient may have had a nonspecific URI -Improved -Stable on room air -In addition, patient had quit using her Symbicort for several days prior to admission   Family Communication:   Daughter updated on phone Disposition Plan:   Home 10/14 if stable       Procedures/Studies: X-ray Chest Pa And  Lateral  02/01/2015  CLINICAL DATA:  Patient with cough and congestion. History of pneumonia. EXAM: CHEST  2 VIEW COMPARISON:  Chest radiograph 01/31/2015 FINDINGS: Monitoring leads overlie the patient. Stable enlarged cardiac and mediastinal contours. No consolidative pulmonary opacities. Minimal heterogeneous opacities left base. Mid thoracic spine degenerative changes. IMPRESSION: Pulmonary hyperinflation. Minimal heterogeneous opacities left lung base favored to represent atelectasis. Electronically Signed   By: Lovey Newcomer M.D.   On: 02/01/2015 10:29   Dg Chest 2 View  01/31/2015  CLINICAL DATA:  Cough. Shortness of breath. COPD. Atrial fibrillation. Parkinson's disease. EXAM: CHEST  2 VIEW COMPARISON:  01/17/2015 FINDINGS: Atherosclerotic aortic arch. Borderline enlargement of the cardiopericardial silhouette. No edema. Subsegmental atelectasis or scarring along the left hemidiaphragm. No significant pleural effusion. IMPRESSION: 1. Subsegmental atelectasis along the left hemidiaphragm. 2. Borderline enlargement of the cardiopericardial silhouette, without edema. 3. Atherosclerotic aortic arch. Electronically Signed   By: Van Clines M.D.   On: 01/31/2015 12:59   Dg Chest 2 View  01/17/2015  CLINICAL DATA:  Pneumonia, cough EXAM: CHEST  2 VIEW COMPARISON:  10/04/2014 FINDINGS: The lungs are hyperinflated likely secondary to COPD. There is no focal parenchymal opacity. There is no pleural effusion or pneumothorax. The heart and mediastinal contours are unremarkable. There is an S-shaped scoliosis of the thoracolumbar spine. IMPRESSION: No active cardiopulmonary disease. Electronically Signed   By: Kathreen Devoid   On: 01/17/2015 12:57   Ct Abdomen Pelvis W Contrast  01/19/2015  CLINICAL DATA:  Intermittent right lower quadrant abdominal pain. History of the ileal resection for obstruction with appendectomy. No history of malignancy. Initial encounter. EXAM: CT ABDOMEN AND PELVIS WITH CONTRAST  TECHNIQUE: Multidetector CT imaging of the abdomen and pelvis  was performed using the standard protocol following bolus administration of intravenous contrast. CONTRAST:  172m OMNIPAQUE IOHEXOL 300 MG/ML  SOLN COMPARISON:  CT 03/24/2012.  Abdominal ultrasound 12/27/2014. FINDINGS: Lower chest: There are stable emphysematous changes with mild scarring or atelectasis at both lung bases. There is no pleural or pericardial effusion. A small hiatal hernia and distal esophageal wall thickening are similar to the prior study. Hepatobiliary: The liver is normal in density without focal abnormality. The gallbladder is present as demonstrated on recent ultrasound. Infolding and wall thickening at the gallbladder fundus are unchanged, likely an incidental Phrygian cap. Extrahepatic biliary dilatation to 10 mm appears unchanged. Pancreas: The pancreas is stable in appearance. There is no evidence of pancreatic mass, surrounding inflammation or pancreatic ductal dilatation. Spleen: Normal in size without focal abnormality. Adrenals/Urinary Tract: Both adrenal glands appear normal. The right kidney is normally positioned and appears normal without mass lesion or hydronephrosis. The left kidney is positioned in the mid pelvis and demonstrates chronic cortical thinning, but no hydronephrosis or mass lesion. The bladder appears unremarkable. Stomach/Bowel: No evidence of bowel wall thickening, distention or surrounding inflammatory change. A supraumbilical hernia containing a portion of the transverse colon is again noted. There is no evidence of incarceration or obstruction. The colon is stool-filled and redundant. Vascular/Lymphatic: There are no enlarged abdominal or pelvic lymph nodes. There is diffuse tortuosity of the aorta and iliac arteries with moderate associated atherosclerosis. Reproductive: Unremarkable. Other: A small amount of free pelvic fluid is similar to the prior examination. Musculoskeletal: No acute or  significant osseous findings. There are stable degenerative changes throughout the lumbar spine, both hips and the symphysis pubis. Chronic superior endplate compression deformity at L1 and convex left scoliosis appear unchanged. IMPRESSION: 1. No acute findings or explanation for the patient's symptoms identified. The colon is stool filled and redundant. There is a stable supraumbilical hernia containing a portion of the transverse colon, but no evidence of incarceration or obstruction. 2. Nonspecific distal esophageal wall thickening, similar to prior examination. 3. 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 4. Pelvic left kidney, aortoiliac atherosclerosis and degenerative changes again noted. Electronically Signed   By: WRichardean SaleM.D.   On: 01/19/2015 11:41         Subjective: Patient denies fevers, chills, headache, chest pain, dyspnea, nausea, vomiting, diarrhea, abdominal pain, dysuria, hematuria   Objective: Filed Vitals:   02/01/15 0856 02/01/15 0909 02/01/15 0942 02/01/15 1037  BP: 79/53  103/61 90/56  Pulse:    116  Temp:    98.3 F (36.8 C)  TempSrc:    Oral  Resp:      Height:      Weight:      SpO2:  99%      Intake/Output Summary (Last 24 hours) at 02/01/15 1130 Last data filed at 02/01/15 1024  Gross per 24 hour  Intake    660 ml  Output   1350 ml  Net   -690 ml   Weight change:  Exam:   General:  Pt is alert, follows commands appropriately, not in acute distress  HEENT: No icterus, No thrush, No neck mass, [place]/AT  Cardiovascular: RRR, S1/S2, no rubs, no gallops  Respiratory: CTA bilaterally, no wheezing, no crackles, no rhonchi  Abdomen: Soft/+BS, non tender, non distended, no guarding  Extremities: No edema, No lymphangitis, No petechiae, No rashes, no synovitis  Data Reviewed: Basic Metabolic Panel:  Recent Labs Lab 01/31/15 1240  NA 136  K 5.0  CL 101  CO2 27  GLUCOSE 106*  BUN 12  CREATININE 1.01*    CALCIUM 8.9   Liver Function Tests:  Recent Labs Lab 01/31/15 1240  AST 31  ALT 7*  ALKPHOS 65  BILITOT 1.3*  PROT 6.2*  ALBUMIN 3.5   No results for input(s): LIPASE, AMYLASE in the last 168 hours. No results for input(s): AMMONIA in the last 168 hours. CBC:  Recent Labs Lab 01/31/15 1240  WBC 8.2  NEUTROABS 5.0  HGB 12.9  HCT 39.5  MCV 93.2  PLT 239   Cardiac Enzymes:  Recent Labs Lab 01/31/15 1240 01/31/15 1631 01/31/15 2221  TROPONINI 0.11* 0.10* 0.09*   BNP: Invalid input(s): POCBNP CBG: No results for input(s): GLUCAP in the last 168 hours.  No results found for this or any previous visit (from the past 240 hour(s)).   Scheduled Meds: . apixaban  5 mg Oral BID  . budesonide-formoterol  2 puff Inhalation BID  . diltiazem  30 mg Oral 4 times per day  . pantoprazole  40 mg Oral Daily  . sertraline  150 mg Oral Daily  . sodium chloride  3 mL Intravenous Q12H   Continuous Infusions: . sodium chloride 75 mL/hr at 01/31/15 1930     Shakendra Griffeth, DO  [facility] Pager [phone]  If 7PM-7AM, please contact night-coverage [email or web address] Password [email or web address] 02/01/2015, 11:30 AM   LOS: 1 day

## 2015-02-01 NOTE — Consult Note (Signed)
CARDIOLOGY CONSULT NOTE  Patient ID: NANSI BIRMINGHAM MRN: [id number] DOB/AGE: May 01, 1927 81 y.o.  Admit date: 01/31/2015 Referring Physician: EDP, [facility]. Primary Physician:  Annye Asa, MD Reason for Consultation: A.fib with RVR  HPI: ARIANI SEIER  is a 81 y.o. female with a history of paroxysmal atrial fibrillation but otherwise no other significant cardiovascular history, history of remote cardiac catheterization showing normal coronary arteries, a treadmill exercise stress test done 2012 which was completely normal and a cardiopulmonary stress test that was done about sept 2013 which was also normal.   She presented to the ED on 01/31/2015 with SOB and fatigue and was noted to be in a.fib with RVR at 110-120. She has COPD and had developed progressively worsening SOB over the past several days. She had ran out of albuterol but had continued Symbicort.  She was given IV bolus of cardizem and continued on PO cardizem.  She is presently in sinus although BP has been soft with addition of cardizem.    Past Medical History  Diagnosis Date  . Arthritis   . Hyperlipidemia   . 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 Chronic constipation   . Hemorrhoid   . Varicose veins   . Anxiety   . Depression   . GERD (gastroesophageal reflux disease)   . Atrial fibrillation ([place])   . Recurrent UTI   . Parkinson disease ([place])   . Cardiac arrhythmia due to congenital heart disease   . Mycotic toenails 10/27/2012     Past Surgical History  Procedure Laterality Date  . Appendectomy    . Ileal resection  03/2007    Due to obstruction     Family History  Problem Relation Age of Onset  . Asthma Brother   . Alcohol abuse Brother   . Throat cancer Father   . Alcohol abuse Father   . Lupus Daughter   . Bipolar disorder Daughter   . Lupus Daughter   . Anxiety disorder Daughter   . Alcohol abuse Sister   . Alcohol abuse Maternal Grandfather   . Alcohol abuse  Paternal Grandmother      Social History: Social History   Social History  . Marital Status: Widowed    Spouse Name: N/A  . Number of Children: N/A  . Years of Education: N/A   Occupational History  . Not on file.   Social History Main Topics  . Smoking status: Former Smoker -- 2.00 packs/day for 30 years    Types: Cigarettes    Quit date: 04/21/1980  . Smokeless tobacco: Never Used     Comment: onset age 40 -33, up to > 1ppd (almost 2 ppd)  . Alcohol Use: No  . Drug Use: No  . Sexual Activity: No   Other Topics Concern  . Not on file   Social History Narrative   Widowed, lives alone. 1 child in [place] and 1 in [place].   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     Prescriptions prior to admission  Medication Sig Dispense Refill Last Dose  . albuterol (VENTOLIN HFA) 108 (90 BASE) MCG/ACT inhaler Inhale 2 puffs into the lungs 2 (two) times daily as needed for wheezing or shortness of breath.    ran out  . apixaban (ELIQUIS) 5 MG TABS tablet Take 5 mg by mouth 2 (two) times daily.   01/31/2015 at am  . Ascorbic Acid (VITAMIN C) 1000 MG tablet Take 1,000 mg by mouth daily.   01/31/2015 at Unknown time  .  budesonide-formoterol (SYMBICORT) 160-4.5 MCG/ACT inhaler Inhale 2 puffs into the lungs 2 (two) times daily. 3 Inhaler 3 01/31/2015 at Unknown time  . Calcium Carb-Cholecalciferol (CALCIUM 1000 + D PO) Take 1,000 mg by mouth daily.   01/31/2015 at Unknown time  . celecoxib (CELEBREX) 100 MG capsule Take 100 mg by mouth daily as needed (back pain).   not needed  . omeprazole (PRILOSEC) 20 MG capsule Take 20 mg by mouth daily.   Past Month at Unknown time  . OVER THE COUNTER MEDICATION Place 1 drop into both eyes 3 (three) times daily as needed (dry eyes). Over the counter eye drops   01/31/2015 at Unknown time  . sertraline (ZOLOFT) 100 MG tablet Take 150 mg by mouth daily.   01/31/2015 at Unknown time  . SUPER B COMPLEX/C PO Take 1 tablet by mouth daily.   01/31/2015 at Unknown time      ROS: General: reports fatigue, malaise, and chills Eyes: no blurry vision, diplopia, or amaurosis ENT: no sore throat or hearing loss Resp: reports SOB and cough; no hemoptysis CV: no CP, edema, or palpitations GI: no abdominal pain, nausea, vomiting, diarrhea, or constipation GU: no dysuria, frequency, or hematuria Skin: no rash Neuro: no headache, numbness, tingling, or weakness of extremities Musculoskeletal: no joint pain or swelling Heme: no bleeding, DVT, or easy bruising Endo: no polydipsia or polyuria    Physical Exam: Blood pressure 115/80, pulse 111, temperature 97.5 F (36.4 C), temperature source Oral, resp. rate 18, height '5\' 10"'$  (1.778 m), weight 68.9 kg (151 lb 14.4 oz), SpO2 99 %.   General appearance: alert, cooperative and no distress Neck: no adenopathy, no carotid bruit, no JVD, supple, symmetrical, trachea midline and thyroid not enlarged, symmetric, no tenderness/mass/nodules Lungs: clear to auscultation bilaterally Chest wall: no tenderness Heart: regular rate and rhythm, S1, S2 normal, no murmur, click, rub or gallop Extremities: extremities normal, atraumatic, no cyanosis or edema Pulses: 2+ and symmetric Skin: Skin color, texture, turgor normal. No rashes or lesions  Labs:   Lab Results  Component Value Date   WBC 8.2 01/31/2015   HGB 12.9 01/31/2015   HCT 39.5 01/31/2015   MCV 93.2 01/31/2015   PLT 239 01/31/2015    Recent Labs Lab 01/31/15 1240 02/01/15 1215  NA 136 137  K 5.0 3.9  CL 101 107  CO2 27 25  BUN 12 11  CREATININE 1.01* 0.86  CALCIUM 8.9 8.9  PROT 6.2*  --   BILITOT 1.3*  --   ALKPHOS 65  --   ALT 7*  --   AST 31  --   GLUCOSE 106* 95    Lipid Panel     Component Value Date/Time   CHOL 180 12/01/2012 0919   TRIG 117.0 12/01/2012 0919   HDL 52.80 12/01/2012 0919   CHOLHDL 3 12/01/2012 0919   VLDL 23.4 12/01/2012 0919   LDLCALC 104* 12/01/2012 0919    BNP (last 3 results)  Recent Labs  01/31/15 1240   BNP 398.6*    ProBNP (last 3 results) No results for input(s): PROBNP in the last 8760 hours.  HEMOGLOBIN A1C No results found for: HGBA1C, MPG  Cardiac Panel (last 3 results)  Recent Labs  01/31/15 1240 01/31/15 1631 01/31/15 2221  TROPONINI 0.11* 0.10* 0.09*    Lab Results  Component Value Date   CKTOTAL 61 03/26/2011   CKMB 2.8 03/26/2011   TROPONINI 0.09* 01/31/2015     TSH  Recent Labs  06/20/14 1150 02/01/15  1215  TSH 3.68 2.815    Telemetry monitor 02/01/2015 at 1530: sinus rhythm at a rate of 60bpm  EKG 01/31/2015: Atrial fibrillation with controlled ventricular response at a rate of 90 bpm, normal axis, no evidence of ischemia.  Echo: pending   Radiology: X-ray Chest Pa And Lateral  02/01/2015  CLINICAL DATA:  Patient with cough and congestion. History of pneumonia. EXAM: CHEST  2 VIEW COMPARISON:  Chest radiograph 01/31/2015 FINDINGS: Monitoring leads overlie the patient. Stable enlarged cardiac and mediastinal contours. No consolidative pulmonary opacities. Minimal heterogeneous opacities left base. Mid thoracic spine degenerative changes. IMPRESSION: Pulmonary hyperinflation. Minimal heterogeneous opacities left lung base favored to represent atelectasis. Electronically Signed   By: Lovey Newcomer M.D.   On: 02/01/2015 10:29   Dg Chest 2 View  01/31/2015  CLINICAL DATA:  Cough. Shortness of breath. COPD. Atrial fibrillation. Parkinson's disease. EXAM: CHEST  2 VIEW COMPARISON:  01/17/2015 FINDINGS: Atherosclerotic aortic arch. Borderline enlargement of the cardiopericardial silhouette. No edema. Subsegmental atelectasis or scarring along the left hemidiaphragm. No significant pleural effusion. IMPRESSION: 1. Subsegmental atelectasis along the left hemidiaphragm. 2. Borderline enlargement of the cardiopericardial silhouette, without edema. 3. Atherosclerotic aortic arch. Electronically Signed   By: Van Clines M.D.   On: 01/31/2015 12:59     Scheduled Meds: . apixaban  5 mg Oral BID  . budesonide-formoterol  2 puff Inhalation BID  . diltiazem  30 mg Oral 4 times per day  . pantoprazole  40 mg Oral Daily  . sertraline  150 mg Oral Daily  . sodium chloride  3 mL Intravenous Q12H   Continuous Infusions: . sodium chloride 75 mL/hr at 01/31/15 1930   PRN Meds:.acetaminophen **OR** acetaminophen, ipratropium-albuterol, ondansetron **OR** ondansetron (ZOFRAN) IV  ASSESSMENT AND PLAN:  1. Chronic atrial fibrillation 2. COPD 3. Hyperlipidemia  Recommendation: Pt presented with A.fib with RVR, likely secondary to COPD exacerbation vs viral respiratory infection.  Troponin flat, likely demand ischemia from rapid heart rate. No chest pain, no ischemic changes on EKG. Echo ordered and pending.  Pt presently in sinus rhythm on PO cardizem.   Rachel Bo, NP-C 02/01/2015, 3:36 PM [facility] Pager: [phone] Office: [phone]

## 2015-02-02 ENCOUNTER — Observation Stay (HOSPITAL_COMMUNITY): Payer: Medicare Other

## 2015-02-02 DIAGNOSIS — G20 Parkinson's disease: Secondary | ICD-10-CM

## 2015-02-02 DIAGNOSIS — J438 Other emphysema: Secondary | ICD-10-CM | POA: Diagnosis not present

## 2015-02-02 DIAGNOSIS — I480 Paroxysmal atrial fibrillation: Secondary | ICD-10-CM | POA: Diagnosis not present

## 2015-02-02 DIAGNOSIS — I4891 Unspecified atrial fibrillation: Secondary | ICD-10-CM | POA: Diagnosis not present

## 2015-02-02 DIAGNOSIS — R7989 Other specified abnormal findings of blood chemistry: Secondary | ICD-10-CM | POA: Diagnosis not present

## 2015-02-02 LAB — BASIC METABOLIC PANEL
ANION GAP: 8 (ref 5–15)
BUN: 11 mg/dL (ref 6–20)
CALCIUM: 8.9 mg/dL (ref 8.9–10.3)
CHLORIDE: 105 mmol/L (ref 101–111)
CO2: 28 mmol/L (ref 22–32)
CREATININE: 0.91 mg/dL (ref 0.44–1.00)
GFR calc non Af Amer: 56 mL/min — ABNORMAL LOW (ref >60)
Glucose, Bld: 83 mg/dL (ref 65–99)
Potassium: 4.5 mmol/L (ref 3.5–5.1)
SODIUM: 141 mmol/L (ref 135–145)

## 2015-02-02 MED ORDER — DILTIAZEM HCL 30 MG PO TABS: 30.0000 mg | ORAL_TABLET | Freq: Four times a day (QID) | ORAL | Status: DC

## 2015-02-02 MED ORDER — INFLUENZA VAC SPLIT QUAD 0.5 ML IM SUSY: 0.5000 mL | PREFILLED_SYRINGE | INTRAMUSCULAR | Status: DC

## 2015-02-02 MED ORDER — FLECAINIDE ACETATE 100 MG PO TABS: 100.0000 mg | ORAL_TABLET | Freq: Two times a day (BID) | ORAL | Status: DC

## 2015-02-02 MED ORDER — INFLUENZA VAC SPLIT QUAD 0.5 ML IM SUSY
0.5000 mL | PREFILLED_SYRINGE | INTRAMUSCULAR | Status: AC
  Administered 2015-02-02: 0.5 mL via INTRAMUSCULAR
  Filled 2015-02-02: qty 0.5

## 2015-02-02 NOTE — Discharge Summary (Signed)
Pt got discharged, discharge instructions provided and patient showed understanding to it, IV taken out,Telemonitor DC,pt left unit in wheelchair with all of the belongings.

## 2015-02-02 NOTE — Progress Notes (Signed)
  Echocardiogram 2D Echocardiogram has been performed.  [name] 02/02/2015, 9:08 AM

## 2015-02-02 NOTE — Evaluation (Signed)
Physical Therapy Evaluation Patient Details Name: [name] MRN: [id number] DOB: 1928/02/22 Today's Date: 02/02/2015   History of Present Illness  81 year old female with a history of COPD, paroxysmal fibrillation, hyperlipidemia, Parkinson's disease presented with 3-4 day history of worsening shortness of breath, coughing, and generalized malaise. The patient states that she is chronically short of breath but has been worse over the past 4 days, particularly with dyspnea on exertion. In addition, the patient has been feeling palpitations and her heart racing. The patient has subjective fevers and chills. She denies any hemoptysis, nausea, vomiting, hematochezia, melena. The patient has diagnosis of irritable bowel syndrome which has caused her to have loose stools almost on a daily basis. She denies any chest discomfort, dizziness, syncope. In the emergency department, the patient was noted to have atrial fibrillation with RVR with a heart rate up to 120. The patient was initially started on diltiazem 30 mg po every 6 hours. She spontaneously converted back to sinus rhythm. The pt was seen by cardiology, Dr. Einar Gip. He did not recommend continuing diltiazem regularly scheduled after d/c. He recommended a "pill-in-pocket" approach which he explained to the patient. The patient remained afebrile and hemodynamically stable throughout the rest of the hospitalization.  Clinical Impression  Pt admitted with above diagnosis. Pt currently with functional limitations due to the deficits listed below (see PT Problem List). Pt able to ambulate and does better with RW.  Pt agrees to use RW at home.  REcommend HHPT f/u. Will follow acutely as well.   Pt will benefit from skilled PT to increase their independence and safety with mobility to allow discharge to the venue listed below.     Follow Up Recommendations Home health PT [facility])    Equipment Recommendations  None recommended by PT     Recommendations for Other Services       Precautions / Restrictions Precautions Precautions: Fall Restrictions Weight Bearing Restrictions: No      Mobility  Bed Mobility               General bed mobility comments: in vchair on arrival  Transfers Overall transfer level: Independent               General transfer comment: No difficulties getting up.   Ambulation/Gait Ambulation/Gait assistance: Min guard Ambulation Distance (Feet): 250 Feet Assistive device: None Gait Pattern/deviations: Step-to pattern;Decreased stride length;Decreased step length - right;Decreased step length - left;Shuffle;Festinating;Staggering left;Staggering right;Trunk flexed   Gait velocity interpretation: Below normal speed for age/gender General Gait Details: Pt ambulates with typical Parkinsonian gait. Pt has festinating gait but can change speeds upon command.  Also still with ability to stop although not abruptly.  Pt able to ambulate without device with guard assist with PT needing to steady pt when challenging pt.  Staggered right and left with challenges needing steadying assist for balance.  Does much better with RW for steady gait and pt aware that she should use it at home. She has two RWs and plans to leave one upstairs and one downstairs.   Stairs Stairs: Yes Stairs assistance: Modified independent (Device/Increase time) Stair Management: One rail Right;Forwards;Alternating pattern Number of Stairs: 10 General stair comments: Pt able to go up and down stairs with no assist by PT.    Wheelchair Mobility    Modified Rankin (Stroke Patients Only)       Balance Overall balance assessment: Needs assistance         Standing balance support: No upper extremity supported;During  functional activity Standing balance-Leahy Scale: Fair Standing balance comment: can stand statically without assist.  No challenges given              High level balance activites: Direction  changes;Turns;Sudden stops High Level Balance Comments: Does well with above with RW but needs min guard assist if not using RW.               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  Wants to get back to that.    Prior Function Level of Independence: Independent         Comments: States she has Parkinsons but is in denial that she has it.     Hand Dominance        Extremity/Trunk Assessment   Upper Extremity Assessment: Defer to OT evaluation           Lower Extremity Assessment: Generalized weakness;RLE deficits/detail;LLE deficits/detail      Cervical / Trunk Assessment: Kyphotic  Communication   Communication: No difficulties  Cognition Arousal/Alertness: Awake/alert Behavior During Therapy: WFL for tasks assessed/performed Overall Cognitive Status: Within Functional Limits for tasks assessed                      General Comments      Exercises        Assessment/Plan    PT Assessment Patient needs continued PT services  PT Diagnosis Generalized weakness   PT Problem List Decreased balance;Decreased activity tolerance;Decreased mobility;Decreased knowledge of use of DME;Decreased safety awareness;Decreased knowledge of precautions;Decreased coordination  PT Treatment Interventions DME instruction;Gait training;Functional  mobility training;Therapeutic activities;Therapeutic exercise;Balance training;Patient/family education   PT Goals (Current goals can be found in the Care Plan section) Acute Rehab PT Goals Patient Stated Goal: to get better PT Goal Formulation: With patient Time For Goal Achievement: 02/09/15 Potential to Achieve Goals: Good    Frequency Min 3X/week   Barriers to discharge        Co-evaluation               End of Session Equipment Utilized During Treatment: Gait belt Activity Tolerance: Patient tolerated treatment well Patient left: in chair;with call bell/phone within reach;with chair alarm set Nurse Communication: Mobility status    Functional Assessment Tool Used: clinical judgement Functional Limitation: Mobility: Walking and moving around Mobility: Walking and Moving Around Current Status [phone]): At least 1 percent but less than 20 percent impaired, limited or restricted Mobility: Walking and Moving Around Goal Status [phone]): At least 1 percent but less than 20 percent impaired, limited or restricted Mobility: Walking and Moving Around Discharge Status [phone]): At least 1 percent but less than 20 percent impaired, limited or restricted    Time: 0917-0941 PT Time Calculation (min) (ACUTE ONLY): 24 min   Charges:   PT Evaluation $Initial PT Evaluation Tier I: 1 Procedure PT Treatments $Gait Training: 8-22 mins   PT G Codes:   PT G-Codes **NOT FOR INPATIENT CLASS**  Functional Assessment Tool Used: clinical judgement Functional Limitation: Mobility: Walking and moving around Mobility: Walking and Moving Around Current Status [phone]): At least 1 percent but less than 20 percent impaired, limited or restricted Mobility: Walking and Moving Around Goal Status [phone]): At least 1 percent but less than 20 percent impaired, limited or restricted Mobility: Walking and Moving Around Discharge Status [phone]): At least 1 percent but less than 20 percent impaired, limited or  restricted    Denice Paradise 02/02/2015, 12:02 PM Aryiah Monterosso,PT Acute Rehabilitation [phone] (pager)

## 2015-02-02 NOTE — Discharge Summary (Signed)
Physician Discharge Summary  [name] XTK:[id number] DOB: 1927/07/17 DOA: 01/31/2015  PCP: Annye Asa, MD  Admit date: 01/31/2015 Discharge date: 02/02/2015  Recommendations for Outpatient Follow-up:  1. Pt will need to follow up with PCP in 2 weeks post discharge 2. Please obtain BMP in 1-2 weeks  Discharge Diagnoses:  Atrial fibrillation with RVR (Paroxsymal Afib) -spontaneously converted back to sinus -The patient was not on any rate controlling agent prior to admission -Suspect that the patient's COPD superimposed upon recent URI may have contributed to her RVR -pt seen by cardiology--Dr. Rebecka Apley not feel pt needed additional Echo and cleared pt for d/c -Dr. Einar Gip recommended taking flecainide '100mg'$  bid and diltiazem '30mg'$  only at the onset of symptoms of Afib/palpitations ("pill-in-pocket" approach per Dr. Einar Gip) -CHADS-VAsc = 3 -TSH--2.815 -Continue apixaban Elevated troponin -No symptoms of angina -Secondary to patient's tachycardia -EKG without concerning ischemic changes COPD -Compensated -Restart Symbicort--pt stopped taking several days prior to admission -Continue duo nebs -After discharge, the patient can restart her albuterol MDI rescue inhaler -Patient quit smoking 30 years ago, but had approximate 60-pack-year history Dyspnea and cough -Worsening likely due to the patient's atrial fib relation with RVR although -certainly the patient may have had a nonspecific URI -Improved -No indication for antibiotics at this time -Stable on room air -In addition, patient had quit using her Symbicort for several days prior to admission -Certainly pt's my had component of GERD/allergic rhinitis and her COPD will predispose her to chronic cough  Discharge Condition: stable  Disposition: home  Diet:heart healthy Wt Readings from Last 3 Encounters:  02/02/15 70.806 kg (156 lb 1.6 oz)  01/17/15 69.4 kg (153 lb)  01/09/15 68.947 kg (152 lb)     History of present illness:  81 year old female with a history of COPD, paroxysmal fibrillation, hyperlipidemia, Parkinson's disease presented with 3-4 day history of worsening shortness of breath, coughing, and generalized malaise. The patient states that she is chronically short of breath but has been worse over the past 4 days, particularly with dyspnea on exertion. In addition, the patient has been feeling palpitations and her heart racing. The patient has subjective fevers and chills. She denies any hemoptysis, nausea, vomiting, hematochezia, melena. The patient has diagnosis of irritable bowel syndrome which has caused her to have loose stools almost on a daily basis. She denies any chest discomfort, dizziness, syncope. In the emergency department, the patient was noted to have atrial fibrillation with RVR with a heart rate up to 120. The patient was initially started on diltiazem 30 mg po every 6 hours. She spontaneously converted back to sinus rhythm. The pt was seen by cardiology, Dr. Einar Gip.  He did not recommend continuing diltiazem regularly scheduled after d/c.  He recommended a "pill-in-pocket" approach which he explained to the patient. The patient remained afebrile and hemodynamically stable throughout the rest of the hospitalization. The patient was discharged in stable condition.    Consultants: Cardiology--Ganji  Discharge Exam: Filed Vitals:   02/02/15 0445  BP: 118/75  Pulse: 59  Temp: 98 F (36.7 C)  Resp: 20   Filed Vitals:   02/01/15 1540 02/01/15 2028 02/01/15 2344 02/02/15 0445  BP: 94/61 97/61 105/63 118/75  Pulse: 69 68 61 59  Temp: 97.8 F (36.6 C) 98.1 F (36.7 C) 98 F (36.7 C) 98 F (36.7 C)  TempSrc: Oral Oral Oral Oral  Resp: '18 20 18 20  '$ Height:      Weight:    70.806 kg (156 lb 1.6 oz)  SpO2: 97% 95% 92% 93%   General: A&O x 3, NAD, pleasant, cooperative Cardiovascular: RRR, no rub, no gallop, no S3 Respiratory: CTAB, no wheeze, no  rhonchi Abdomen:soft, nontender, nondistended, positive bowel sounds Extremities: No edema, No lymphangitis, no petechiae  Discharge Instructions      Discharge Instructions    Ambulatory referral to Neurology    Complete by:  As directed   Refer to [facility] Neurology  Parkinson's Disease     Diet - low sodium heart healthy    Complete by:  As directed      Discharge instructions    Complete by:  As directed   Take diltiazem 30 mg every 6 hours at the onset of palpitations and atrial fibrillation Take flecainide '100mg'$  twice daily at the onset of palpitations and atrial fibrillation     Increase activity slowly    Complete by:  As directed             Medication List    TAKE these medications        apixaban 5 MG Tabs tablet  Commonly known as:  ELIQUIS  Take 5 mg by mouth 2 (two) times daily.     budesonide-formoterol 160-4.5 MCG/ACT inhaler  Commonly known as:  SYMBICORT  Inhale 2 puffs into the lungs 2 (two) times daily.     CALCIUM 1000 + D PO  Take 1,000 mg by mouth daily.     celecoxib 100 MG capsule  Commonly known as:  CELEBREX  Take 100 mg by mouth daily as needed (back pain).     diltiazem 30 MG tablet  Commonly known as:  CARDIZEM  Take 1 tablet (30 mg total) by mouth 4 (four) times daily. Take at onset on atrial fibrillation     flecainide 100 MG tablet  Commonly known as:  TAMBOCOR  Take 1 tablet (100 mg total) by mouth 2 (two) times daily. Take at onset of atrial fibrillation     omeprazole 20 MG capsule  Commonly known as:  PRILOSEC  Take 20 mg by mouth daily.     OVER THE COUNTER MEDICATION  Place 1 drop into both eyes 3 (three) times daily as needed (dry eyes). Over the counter eye drops     sertraline 100 MG tablet  Commonly known as:  ZOLOFT  Take 150 mg by mouth daily.     SUPER B COMPLEX/C PO  Take 1 tablet by mouth daily.     VENTOLIN HFA 108 (90 BASE) MCG/ACT inhaler  Generic drug:  albuterol  Inhale 2 puffs into the lungs 2  (two) times daily as needed for wheezing or shortness of breath.     vitamin C 1000 MG tablet  Take 1,000 mg by mouth daily.         The results of significant diagnostics from this hospitalization (including imaging, microbiology, ancillary and laboratory) are listed below for reference.    Significant Diagnostic Studies: X-ray Chest Pa And Lateral  02/01/2015  CLINICAL DATA:  Patient with cough and congestion. History of pneumonia. EXAM: CHEST  2 VIEW COMPARISON:  Chest radiograph 01/31/2015 FINDINGS: Monitoring leads overlie the patient. Stable enlarged cardiac and mediastinal contours. No consolidative pulmonary opacities. Minimal heterogeneous opacities left base. Mid thoracic spine degenerative changes. IMPRESSION: Pulmonary hyperinflation. Minimal heterogeneous opacities left lung base favored to represent atelectasis. Electronically Signed   By: Lovey Newcomer M.D.   On: 02/01/2015 10:29   Dg Chest 2 View  01/31/2015  CLINICAL DATA:  Cough. Shortness  of breath. COPD. Atrial fibrillation. Parkinson's disease. EXAM: CHEST  2 VIEW COMPARISON:  01/17/2015 FINDINGS: Atherosclerotic aortic arch. Borderline enlargement of the cardiopericardial silhouette. No edema. Subsegmental atelectasis or scarring along the left hemidiaphragm. No significant pleural effusion. IMPRESSION: 1. Subsegmental atelectasis along the left hemidiaphragm. 2. Borderline enlargement of the cardiopericardial silhouette, without edema. 3. Atherosclerotic aortic arch. Electronically Signed   By: Van Clines M.D.   On: 01/31/2015 12:59   Dg Chest 2 View  01/17/2015  CLINICAL DATA:  Pneumonia, cough EXAM: CHEST  2 VIEW COMPARISON:  10/04/2014 FINDINGS: The lungs are hyperinflated likely secondary to COPD. There is no focal parenchymal opacity. There is no pleural effusion or pneumothorax. The heart and mediastinal contours are unremarkable. There is an S-shaped scoliosis of the thoracolumbar spine. IMPRESSION: No active  cardiopulmonary disease. Electronically Signed   By: Kathreen Devoid   On: 01/17/2015 12:57   Ct Abdomen Pelvis W Contrast  01/19/2015  CLINICAL DATA:  Intermittent right lower quadrant abdominal pain. History of the ileal resection for obstruction with appendectomy. No history of malignancy. Initial encounter. EXAM: CT ABDOMEN AND PELVIS WITH CONTRAST TECHNIQUE: Multidetector CT imaging of the abdomen and pelvis was performed using the standard protocol following bolus administration of intravenous contrast. CONTRAST:  172m OMNIPAQUE IOHEXOL 300 MG/ML  SOLN COMPARISON:  CT 03/24/2012.  Abdominal ultrasound 12/27/2014. FINDINGS: Lower chest: There are stable emphysematous changes with mild scarring or atelectasis at both lung bases. There is no pleural or pericardial effusion. A small hiatal hernia and distal esophageal wall thickening are similar to the prior study. Hepatobiliary: The liver is normal in density without focal abnormality. The gallbladder is present as demonstrated on recent ultrasound. Infolding and wall thickening at the gallbladder fundus are unchanged, likely an incidental Phrygian cap. Extrahepatic biliary dilatation to 10 mm appears unchanged. Pancreas: The pancreas is stable in appearance. There is no evidence of pancreatic mass, surrounding inflammation or pancreatic ductal dilatation. Spleen: Normal in size without focal abnormality. Adrenals/Urinary Tract: Both adrenal glands appear normal. The right kidney is normally positioned and appears normal without mass lesion or hydronephrosis. The left kidney is positioned in the mid pelvis and demonstrates chronic cortical thinning, but no hydronephrosis or mass lesion. The bladder appears unremarkable. Stomach/Bowel: No evidence of bowel wall thickening, distention or surrounding inflammatory change. A supraumbilical hernia containing a portion of the transverse colon is again noted. There is no evidence of incarceration or obstruction. The  colon is stool-filled and redundant. Vascular/Lymphatic: There are no enlarged abdominal or pelvic lymph nodes. There is diffuse tortuosity of the aorta and iliac arteries with moderate associated atherosclerosis. Reproductive: Unremarkable. Other: A small amount of free pelvic fluid is similar to the prior examination. Musculoskeletal: No acute or significant osseous findings. There are stable degenerative changes throughout the lumbar spine, both hips and the symphysis pubis. Chronic superior endplate compression deformity at L1 and convex left scoliosis appear unchanged. IMPRESSION: 1. No acute findings or explanation for the patient's symptoms identified. The colon is stool filled and redundant. There is a stable supraumbilical hernia containing a portion of the transverse colon, but no evidence of incarceration or obstruction. 2. Nonspecific distal esophageal wall thickening, similar to prior examination. 3. 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 4. Pelvic left kidney, aortoiliac atherosclerosis and degenerative changes again noted. Electronically Signed   By: WRichardean SaleM.D.   On: 01/19/2015 11:41     Microbiology: No results found for this or any previous visit (from the  past 240 hour(s)).   Labs: Basic Metabolic Panel:  Recent Labs Lab 01/31/15 1240 02/01/15 1215 02/02/15 0512  NA 136 137 141  K 5.0 3.9 4.5  CL 101 107 105  CO2 '27 25 28  '$ GLUCOSE 106* 95 83  BUN '12 11 11  '$ CREATININE 1.01* 0.86 0.91  CALCIUM 8.9 8.9 8.9  MG  --  1.9  --    Liver Function Tests:  Recent Labs Lab 01/31/15 1240  AST 31  ALT 7*  ALKPHOS 65  BILITOT 1.3*  PROT 6.2*  ALBUMIN 3.5   No results for input(s): LIPASE, AMYLASE in the last 168 hours. No results for input(s): AMMONIA in the last 168 hours. CBC:  Recent Labs Lab 01/31/15 1240  WBC 8.2  NEUTROABS 5.0  HGB 12.9  HCT 39.5  MCV 93.2  PLT 239   Cardiac Enzymes:  Recent Labs Lab  01/31/15 1240 01/31/15 1631 01/31/15 2221  TROPONINI 0.11* 0.10* 0.09*   BNP: Invalid input(s): POCBNP CBG: No results for input(s): GLUCAP in the last 168 hours.  Time coordinating discharge:  Greater than 30 minutes  Signed:  Omer Monter, DO [facility] Pager: [phone] 02/02/2015, 8:12 AM

## 2015-02-05 ENCOUNTER — Telehealth: Payer: Self-pay | Admitting: Family Medicine

## 2015-02-05 MED ORDER — ALBUTEROL SULFATE HFA 108 (90 BASE) MCG/ACT IN AERS: 2.0000 | INHALATION_SPRAY | Freq: Two times a day (BID) | RESPIRATORY_TRACT | Status: AC | PRN

## 2015-02-05 NOTE — Telephone Encounter (Signed)
Medication filled to pharmacy as requested.   

## 2015-02-05 NOTE — Telephone Encounter (Signed)
Caller name: Aaron Edelman with [facility] in [employer] on [address]  Reason for call: Pt brought in rescue inhaler that expired in 2014 of ventolin. He is requesting refill. Pt uses prn.

## 2015-02-06 ENCOUNTER — Ambulatory Visit (INDEPENDENT_AMBULATORY_CARE_PROVIDER_SITE_OTHER): Payer: Medicare Other | Admitting: Podiatry

## 2015-02-06 ENCOUNTER — Encounter: Payer: Self-pay | Admitting: Podiatry

## 2015-02-06 ENCOUNTER — Telehealth: Payer: Self-pay

## 2015-02-06 DIAGNOSIS — M79676 Pain in unspecified toe(s): Secondary | ICD-10-CM | POA: Diagnosis not present

## 2015-02-06 DIAGNOSIS — B351 Tinea unguium: Secondary | ICD-10-CM | POA: Diagnosis not present

## 2015-02-06 NOTE — Telephone Encounter (Signed)
Patient returning your call best # [phone] (patient states its such a beautiful day and she was sitting her porch patient states will keep phone near

## 2015-02-06 NOTE — Progress Notes (Signed)
Patient ID: [name], female   DOB: 20-May-1927, 81 y.o.   MRN: [id number] Complaint:  Visit Type: Patient returns to my office for continued preventative foot care services. Complaint: Patient states" my nails have grown long and thick and become painful to walk and wear shoes" . She presents for preventative foot care services. No changes to ROS.  She says she has been hospitalized with atrial fib and asthma.  Podiatric Exam: Vascular: dorsalis pedis and posterior tibial pulses are palpable bilateral. Capillary return is immediate. Temperature gradient is WNL. Skin turgor WNL  Sensorium: Normal Semmes Weinstein monofilament test. Normal tactile sensation bilaterally. Nail Exam: Pt has thick disfigured discolored nails with subungual debris noted bilateral entire nail hallux through fifth toenails Ulcer Exam: There is no evidence of ulcer or pre-ulcerative changes or infection. Orthopedic Exam: Muscle tone and strength are WNL. No limitations in general ROM. No crepitus or effusions noted. Foot type and digits show no abnormalities. Bony prominences are unremarkable. Skin: No Porokeratosis. No infection or ulcers  Diagnosis:  Tinea unguium, Pain in right toe, pain in left toes  Treatment & Plan Procedures and Treatment: Consent by patient was obtained for treatment procedures. The patient understood the discussion of treatment and procedures well. All questions were answered thoroughly reviewed. Debridement of mycotic and hypertrophic toenails, 1 through 5 bilateral and clearing of subungual debris. No ulceration, no infection noted.  Return Visit-Office Procedure: Patient instructed to return to the office for a follow up visit 3 months for continued evaluation and treatment.

## 2015-02-06 NOTE — Telephone Encounter (Signed)
PCP: Annye Asa, MD  Admit date: 01/31/2015 Discharge date: 02/02/2015  Recommendations for Outpatient Follow-up:  1. Pt will need to follow up with PCP in 2 weeks post discharge 2. Please obtain BMP in 1-2 weeks  Reason for admission: AF with RVR, COPD, Elevated Troponin  Hospital follow up scheduled: 02/09/15 @ 11 am   Called.  No answer. Left a mesage for call back.

## 2015-02-07 ENCOUNTER — Ambulatory Visit: Payer: Medicare Other | Admitting: Psychiatry

## 2015-02-07 NOTE — Telephone Encounter (Signed)
error 

## 2015-02-07 NOTE — Telephone Encounter (Signed)
PCP: Annye Asa, MD  Admit date: 01/31/2015  Discharge date: 02/02/2015   Recommendations for Outpatient Follow-up:  1. Pt will need to follow up with PCP in 2 weeks post discharge 2. Please obtain BMP in 1-2 weeks  Reason for admission: AF with RVR, COPD, Elevated Troponin   Hospital follow up scheduled: 02/09/15 @ 11 am  Transition Care Management Follow-up Telephone Call  How have you been since you were released from the hospital?  Pt states she has been very tired since discharge.  Becomes short of breath with exertion.  She still has a cough that is occasionally productive.  Not on oxygen.  Uses Symbicort- 2 puffs twice a day and has rescue inhaler on hand if needed.  She denies other symptoms.      Do you understand why you were in the hospital? yes   Do you understand the discharge instructions? yes  Items Reviewed:  Medications reviewed: yes, there was a discrepancy in how flecainide is prescribed.  Pt asked to bring in bottle for clarification.  Allergies reviewed: yes  Dietary changes reviewed: yes, low sodium heart healthy diet  Referrals reviewed: yes, appt already scheduled with Dr. Carles Collet.     Functional Questionnaire:   Activities of Daily Living (ADLs):   She states they are independent in the following: ambulation, bathing and hygiene, feeding, continence, grooming, toileting and dressing States they require assistance with the following: States able to take care of self at home, but she becomes fatigued and short of breath with exertion.    Any transportation issues/concerns?: no   Any patient concerns?  Wants to know why she continues to get sick?  She's taking precautions recommended (such as avoid large crowds), but continues to get sick.  Home Physical Therapy?  Oxygen at home?  Pt is having a hard time deciding whether to stay with practice, follow Dr. Birdie Riddle out to [place], or change practice, and would like to discuss.     Confirmed  importance and date/time of follow-up visits scheduled: yes   Confirmed with patient if condition begins to worsen call PCP or go to the ER: yes

## 2015-02-07 NOTE — Telephone Encounter (Signed)
Left a message for call back.  

## 2015-02-09 ENCOUNTER — Other Ambulatory Visit: Payer: Self-pay

## 2015-02-09 ENCOUNTER — Ambulatory Visit (INDEPENDENT_AMBULATORY_CARE_PROVIDER_SITE_OTHER): Payer: Medicare Other | Admitting: Family Medicine

## 2015-02-09 ENCOUNTER — Encounter: Payer: Self-pay | Admitting: Family Medicine

## 2015-02-09 ENCOUNTER — Encounter: Payer: Self-pay | Admitting: General Practice

## 2015-02-09 VITALS — BP 130/78 | HR 66 | Temp 97.9°F | Resp 16 | Ht 70.0 in | Wt 153.4 lb

## 2015-02-09 DIAGNOSIS — G20 Parkinson's disease: Secondary | ICD-10-CM

## 2015-02-09 DIAGNOSIS — Z1231 Encounter for screening mammogram for malignant neoplasm of breast: Secondary | ICD-10-CM

## 2015-02-09 DIAGNOSIS — I4891 Unspecified atrial fibrillation: Secondary | ICD-10-CM

## 2015-02-09 DIAGNOSIS — J449 Chronic obstructive pulmonary disease, unspecified: Secondary | ICD-10-CM | POA: Diagnosis not present

## 2015-02-09 DIAGNOSIS — J438 Other emphysema: Secondary | ICD-10-CM

## 2015-02-09 LAB — BASIC METABOLIC PANEL
BUN: 13 mg/dL (ref 6–23)
CALCIUM: 9.9 mg/dL (ref 8.4–10.5)
CHLORIDE: 102 meq/L (ref 96–112)
CO2: 31 mEq/L (ref 19–32)
CREATININE: 0.98 mg/dL (ref 0.40–1.20)
GFR: 57.32 mL/min — ABNORMAL LOW (ref >60.00)
Glucose, Bld: 92 mg/dL (ref 70–99)
Potassium: 4.9 mEq/L (ref 3.5–5.1)
Sodium: 140 mEq/L (ref 135–145)

## 2015-02-09 NOTE — Progress Notes (Signed)
   Subjective:    Patient ID: [name], female    DOB: March 08, 1928, 81 y.o.   MRN: [id number]  [facility] f/u- pt was admitted 10/12-10/14 for COPD exacerbation and Afib w/ RVR.  Pt was advised to restart Symbicort which she had stopped several days prior to her admission.  She did not require abx.  Afib spontaneously resolved and her cardiologist Einar Gip) recommended that she take Diltiazem '30mg'$  Q6 prn palpitations and Flecainide '100mg'$  BID prn palpitations.  No current palpitations or CP.  COPD- pt is wondering whether she needs overnight O2.  Has not had recent overnight pulse oximetry.  Denies SOB today.  Parkinson's- pt had PT evaluation in the hospital and they recommended therapy 3x/week.  Pt is not currently driving- she has someone bringing her to her appts.   Review of Systems For ROS see HPI     Objective:   Physical Exam  Constitutional: She is oriented to person, place, and time. She appears well-developed and well-nourished. No distress.  HENT:  Head: Normocephalic and atraumatic.  Eyes: Conjunctivae and EOM are normal. Pupils are equal, round, and reactive to light.  Neck: Normal range of motion. Neck supple. No thyromegaly present.  Cardiovascular: Normal rate, regular rhythm, normal heart sounds and intact distal pulses.   No murmur heard. Pulmonary/Chest: Effort normal and breath sounds normal. No respiratory distress.  Abdominal: Soft. She exhibits no distension. There is no tenderness.  Musculoskeletal: She exhibits no edema.  Lymphadenopathy:    She has no cervical adenopathy.  Neurological: She is alert and oriented to person, place, and time.  Skin: Skin is warm and dry.  Psychiatric: She has a normal mood and affect. Her behavior is normal.  Vitals reviewed.         Assessment & Plan:

## 2015-02-09 NOTE — Assessment & Plan Note (Signed)
Ongoing issue for pt.  Asymptomatic today.  Pt is aware that she is supposed to take the Dilt and the Flecainide as needed for palpitations.  Is following w/ cardiology regularly.  Check BMP as recommended in d/c summary.  Will continue to follow along.

## 2015-02-09 NOTE — Assessment & Plan Note (Signed)
Chronic problem although pt has been reluctant to admit her diagnosis and seek treatment for it.  She does have appt w/ neuro on Monday.  She had a PT evaluation while hospitalized that recommended PT 3x/week.  She is not currently driving and she is not able to ask her neighbors to take her to PT 3x/week so I will attempt to get her home health PT.  Pt expressed understanding and is in agreement w/ plan.

## 2015-02-09 NOTE — Assessment & Plan Note (Signed)
Chronic problem.  Pt is back on her Symbicort twice daily w/ improvement in her SOB.  Discussed need to start OTC antihistamine daily to decrease nasal congestion and post nasal drip which can contribute to cough.  Due to her COPD, will attempt to get overnight oximetry to determine whether she requires nightly O2.  Will follow.

## 2015-02-09 NOTE — Progress Notes (Signed)
Pre visit review using our clinic review tool, if applicable. No additional management support is needed unless otherwise documented below in the visit note. 

## 2015-02-09 NOTE — Patient Instructions (Signed)
Follow up as scheduled We'll notify you of your lab results and make any changes if needed We put in a home health referral for physical therapy and your overnight oximetry- they should contact you to set that up Make sure you are taking your symbicort twice daily and use your albuterol as needed for cough, shortness of breath Start OTC Zyrtec daily to decrease allergy congestion Drink plenty of fluids REST!  Ease back into activity as you feel ready Call with any questions or concerns Hang in there!!!

## 2015-02-12 ENCOUNTER — Other Ambulatory Visit (INDEPENDENT_AMBULATORY_CARE_PROVIDER_SITE_OTHER): Payer: Medicare Other

## 2015-02-12 ENCOUNTER — Encounter: Payer: Self-pay | Admitting: Neurology

## 2015-02-12 ENCOUNTER — Other Ambulatory Visit (HOSPITAL_COMMUNITY): Payer: Self-pay | Admitting: Neurology

## 2015-02-12 ENCOUNTER — Ambulatory Visit (INDEPENDENT_AMBULATORY_CARE_PROVIDER_SITE_OTHER): Payer: Medicare Other | Admitting: Neurology

## 2015-02-12 VITALS — BP 82/48 | HR 82 | Ht 70.0 in | Wt 153.0 lb

## 2015-02-12 DIAGNOSIS — E538 Deficiency of other specified B group vitamins: Secondary | ICD-10-CM | POA: Diagnosis not present

## 2015-02-12 DIAGNOSIS — F458 Other somatoform disorders: Secondary | ICD-10-CM

## 2015-02-12 DIAGNOSIS — G609 Hereditary and idiopathic neuropathy, unspecified: Secondary | ICD-10-CM

## 2015-02-12 DIAGNOSIS — R739 Hyperglycemia, unspecified: Secondary | ICD-10-CM | POA: Diagnosis not present

## 2015-02-12 DIAGNOSIS — G20 Parkinson's disease: Secondary | ICD-10-CM

## 2015-02-12 DIAGNOSIS — R1319 Other dysphagia: Secondary | ICD-10-CM

## 2015-02-12 DIAGNOSIS — R1310 Dysphagia, unspecified: Secondary | ICD-10-CM

## 2015-02-12 LAB — VITAMIN B12: VITAMIN B 12: 221 pg/mL (ref 211–911)

## 2015-02-12 LAB — FOLATE

## 2015-02-12 LAB — HEMOGLOBIN A1C: Hgb A1c MFr Bld: 5.7 % (ref 4.6–6.5)

## 2015-02-12 NOTE — Progress Notes (Signed)
[name] was seen today in the movement disorders clinic for neurologic consultation at the request of Shanon Brow Arzella Rehmann.  Her PCP is Annye Asa, MD.  She is accompanied by her friend who supplements the history.  Prior records made available to me were reviewed.  The patient apparently was diagnosed with Parkinson's disease by Dr. Leta Baptist in 2011, but thinks she had symptoms long before that.  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  Dr. Gladstone Lighter records indicate that the patient was never very accepting of the diagnosis.  He did start her on levodopa but she only worked up to one tablet twice a day and ultimately ended up stopping the medication in February, 2014 after she didn't think that the medication was helpful.  She states "I didn't accept the diagnosis and I don't like to take medication."  She really has not had any neurology follow-up since that time.  She did have an appointment with me in September, 2015 but she canceled that.   Specific Symptoms:  Tremor: Yes.  , both hands (friend states L more than R but pt thinks that they shake equally) Voice: hypophonic Sleep: sleeps well  Vivid Dreams:  rarely  Acting out dreams:  No. Wet Pillows: Yes.   Postural symptoms:  Yes.  , did better when she was going to [facility] but not going lately due to lack of energy  Falls? None in the last few years; prior did fall down stairs and have stitches Bradykinesia symptoms: shuffling gait and slow movements Loss of smell:  No. Loss of taste:  No. Urinary Incontinence:  Yes.   (rarely; more urinary frequency and nocturia) Difficulty Swallowing:  Yes.  , for years Handwriting, micrographia: Yes.   Trouble with ADL's:  No., but it is slower  Trouble  buttoning clothing: Yes.   (slower) Depression:  No. but admits to anxiety Memory changes:  Yes.   and lives alone Hallucinations:  No.  visual distortions: Yes.   N/V:  Yes.   (occasional nausea) Lightheaded:  No.  Syncope: No. Diplopia:  Yes.   (horizontal, binocular) - intermittent and doesn't affect reading Dyskinesia:  No.   PREVIOUS MEDICATIONS: Sinemet  ALLERGIES:   Allergies  Allergen Reactions  . Adhesive [Tape] Other (See Comments)    Caused blisters  . Amoxicillin Other (See Comments)    Caused thrush  . Ciprofloxacin Other (See Comments)    Caused thrush  . Ultram [Tramadol] Other (See Comments)    hypotension    CURRENT MEDICATIONS:  Outpatient Encounter Prescriptions as of 02/12/2015  Medication Sig  . albuterol (VENTOLIN HFA) 108 (90 BASE) MCG/ACT inhaler Inhale 2 puffs into the lungs 2 (two) times daily as needed for wheezing or shortness of breath.  Marland Kitchen apixaban (ELIQUIS) 5 MG TABS tablet Take 5 mg by mouth 2 (two) times daily.  . Ascorbic Acid (VITAMIN C) 1000 MG tablet Take 1,000 mg by mouth daily.  . budesonide-formoterol (SYMBICORT) 160-4.5 MCG/ACT inhaler Inhale 2 puffs into the lungs 2 (two) times daily.  . Calcium Carb-Cholecalciferol (CALCIUM 1000 + D PO) Take 1,000 mg by mouth daily.  . sertraline (ZOLOFT) 100 MG tablet Take 150 mg by mouth daily.  Marland Kitchen trimethoprim (TRIMPEX) 100 MG  tablet Take 100 mg by mouth daily.  . celecoxib (CELEBREX) 100 MG capsule Take 100 mg by mouth daily as needed (back pain).  Marland Kitchen diltiazem (CARDIZEM) 30 MG tablet Take 1 tablet (30 mg total) by mouth 4 (four) times daily. Take at onset on atrial fibrillation (Patient not taking: Reported on 02/12/2015)  . flecainide (TAMBOCOR) 100 MG tablet Take 1 tablet (100 mg total) by mouth 2 (two) times daily. Take at onset of atrial fibrillation (Patient not taking: Reported on 02/12/2015)  . [DISCONTINUED] omeprazole (PRILOSEC) 20 MG capsule Take 20 mg by mouth daily.  . [DISCONTINUED]  OVER THE COUNTER MEDICATION Place 1 drop into both eyes 3 (three) times daily as needed (dry eyes). Over the counter eye drops  . [DISCONTINUED] SUPER B COMPLEX/C PO Take 1 tablet by mouth daily.   No facility-administered encounter medications on file as of 02/12/2015.    PAST MEDICAL HISTORY:   Past Medical History  Diagnosis Date  . Arthritis   . Hyperlipidemia   . 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 Chronic constipation   . Hemorrhoid   . Varicose veins   . Anxiety   . Depression   . GERD (gastroesophageal reflux disease)   . Atrial fibrillation ([place])   . Recurrent UTI   . Parkinson disease ([place])   . Cardiac arrhythmia due to congenital heart disease   . Mycotic toenails 10/27/2012    PAST SURGICAL HISTORY:   Past Surgical History  Procedure Laterality Date  . Appendectomy  81 years old  . Colon resection  2008    SOCIAL HISTORY:   Social History   Social History  . Marital Status: Widowed    Spouse Name: N/A  . Number of Children: N/A  . Years of Education: N/A   Occupational History  . Not on file.   Social History Main Topics  . Smoking status: Former Smoker -- 2.00 packs/day for 30 years    Types: Cigarettes    Quit date: 04/21/1980  . Smokeless tobacco: Never Used     Comment: onset age 72 -63, up to > 1ppd (almost 2 ppd)  . Alcohol Use: No  . Drug Use: No  . Sexual Activity: No   Other Topics Concern  . Not on file   Social History Narrative   Widowed, lives alone. 1 child in [place] and 1 in [place].   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    FAMILY HISTORY:   Family Status  Relation Status Death Age  . Brother Deceased     asthma  . Father Deceased     throat cancer  . Daughter Alive   . Daughter Alive   . Sister Deceased     breast cancer  . Mother Deceased 54    arthritis     ROS:  A complete 10 system review of systems was obtained and was unremarkable apart from what is mentioned above.  PHYSICAL EXAMINATION:    VITALS:   Filed  Vitals:   02/12/15 1007  BP: 82/48  Pulse: 82  Height: '5\' 10"'$  (1.778 m)  Weight: 153 lb (69.4 kg)    GEN:  The patient appears stated age and is in NAD. HEENT:  Normocephalic, atraumatic.  The mucous membranes are moist. The superficial temporal arteries are without ropiness or tenderness. CV:  RRR Lungs:  CTAB Neck/HEME:  There are no carotid bruits bilaterally.  Neurological examination:  Orientation: The patient is alert and oriented x3. Fund of knowledge is appropriate.  Recent and remote memory are intact.  Attention and concentration are normal.    Able to name objects and repeat phrases. Cranial nerves: There is good facial symmetry.  There is no significant facial hypomimia.  Pupils are equal round and reactive to light bilaterally. Fundoscopic exam reveals clear margins bilaterally. Extraocular muscles are intact. The visual fields are full to confrontational testing. The speech is fluent and clear.  She has not particularly hypophonic.  She has no difficulty with guttural sounds.  Soft palate rises symmetrically and there is no tongue deviation. Hearing is intact to conversational tone. Sensation: Sensation is intact to light and pinprick throughout (facial, trunk, extremities). Vibration is markedly decreased in a distal fashion. There is no extinction with double simultaneous stimulation. There is no sensory dermatomal level identified. Motor: Strength is 5/5 in the bilateral upper and lower extremities.   Shoulder shrug is equal and symmetric.  There is no pronator drift. Deep tendon reflexes: Deep tendon reflexes are 2/4 at the bilateral biceps, triceps, brachioradialis, 3/4 at the bilateral patella with cross adductor reflex present and trace at the bilateral achilles. Plantar responses are downgoing bilaterally.  Movement examination: Tone: It is very difficult to assess the patient's tone due to gegenhalten.  Abnormal movements: The patient does have a very mild tremor that  is noticed at rest in both hands. Coordination:  There is no significant decremation with RAM's, with any form of RAMS, including alternating supination and pronation of the forearm, hand opening and closing, finger taps, heel taps and toe taps. Gait and Station: The patient has minimal difficulty arising out of a deep-seated chair without the use of the hands. The patient's stride length is slightly decreased with a stooped posture.  The patient has a negative pull test.      Labs:  Lab Results  Component Value Date   VITAMINB12 373 04/10/2007   No results found for: HGBA1C   ASSESSMENT/PLAN:  1.  Parkinsonism  -While the patient does have some evidence of parkinsonism, it is difficult to assess whether she really has idiopathic Parkinson's disease.  It is difficult to assess her tone (gegenhalten).  Generally, if one has untreated Parkinson's disease for this long, it is very apparent.  However, she has been exercising a lot (was even a marathon runner) and this can significantly slow disease.  We talked about the dat scan, but in the end since this does not change treatment she really wants to hold on that for right now and I think that is prudent.  We did decide to go ahead and do a levodopa challenge test and I will schedule that for next visit.  -Talked extensively about community resources.  Patient education was provided.  We will refer her to the neuro rehabilitation center.  -Talked about the importance of continued exercise.  She was going to do [facility] gym faithfully but has not been going lately.  I encouraged her to start attending again. 2.  Dysphagia  -We'll schedule a modified barium swallow 3.  Gait instability  -I think that this is multifactorial.  While this can because by parkinsonism, she has evidence of very significant peripheral neuropathy.  I will check lab work for reversible causes.  This includes B12, folate, RPR and SPEP/UPEP with immunofixation, hemoglobin A1c (prior  history of hyperglycemia). 4.  Further recommendations will follow the above.  Greater than 50% of the visit was spent in counseling and correlating care.  Total visit time was 60 minutes.

## 2015-02-12 NOTE — Patient Instructions (Signed)
1. Your provider has requested that you have labwork completed today. Please go to [facility] on the second floor of this building before leaving the office today. You do not need to check in. If you are not called within 15 minutes please check with the front desk.  2. You have been referred to Neuro Rehab. They will call you directly to schedule an appointment.  Please call [phone] if you do not hear from them.  3. We have scheduled you at [facility] for your modified barium swallow on 02/22/15 at 1:00 pm. Please arrive 15 minutes prior and go to 1st floor radiology. If you need to reschedule for any reason please call [phone]. 4. On/Off test scheduled for 02/22/2015 at 2:30 pm.

## 2015-02-13 ENCOUNTER — Telehealth: Payer: Self-pay | Admitting: Neurology

## 2015-02-13 LAB — RPR

## 2015-02-13 NOTE — Telephone Encounter (Signed)
Patient notified of results and instructed to start oral B12.

## 2015-02-13 NOTE — Telephone Encounter (Signed)
-----   Message from [place], DO sent at 02/13/2015  7:38 AM EDT ----- Let pt know that she is B12 deficient.  Start oral B12 1070mg daily and put on reminder to recheck in 3-4 months

## 2015-02-13 NOTE — Telephone Encounter (Signed)
Left message on machine for patient to call back.

## 2015-02-14 ENCOUNTER — Telehealth: Payer: Self-pay | Admitting: Family Medicine

## 2015-02-14 LAB — SPEP & IFE WITH QIG
ALBUMIN ELP: 4.1 g/dL (ref 3.8–4.8)
ALPHA-1-GLOBULIN: 0.3 g/dL (ref 0.2–0.3)
ALPHA-2-GLOBULIN: 0.7 g/dL (ref 0.5–0.9)
Beta 2: 0.3 g/dL (ref 0.2–0.5)
Beta Globulin: 0.5 g/dL (ref 0.4–0.6)
GAMMA GLOBULIN: 0.9 g/dL (ref 0.8–1.7)
IgA: 288 mg/dL (ref 69–380)
IgG (Immunoglobin G), Serum: 854 mg/dL (ref 690–1700)
IgM, Serum: 217 mg/dL (ref 52–322)
Total Protein, Serum Electrophoresis: 6.8 g/dL (ref 6.1–8.1)

## 2015-02-14 LAB — UIFE/LIGHT CHAINS/TP QN, 24-HR UR
ALPHA 1 UR: DETECTED — AB
ALPHA 2 UR: DETECTED — AB
Albumin, U: DETECTED
Beta, Urine: DETECTED — AB
Gamma Globulin, Urine: DETECTED — AB
TOTAL PROTEIN, URINE-UPE24: 11 mg/dL (ref 5–24)

## 2015-02-14 NOTE — Telephone Encounter (Signed)
Pt cannot drive to her appts and relies on neighbors.  She is not able to do this w/ any regularity- effectively making her home bound

## 2015-02-14 NOTE — Telephone Encounter (Signed)
.  Caller name:Jim Hoffman  Relation to pt: PT from [place]  Call back number: [phone]  Pharmacy:  Reason for call:  Referral received discussed with patient. Patient stated she is not home bound going to movie on Friday and PT recommends outpainent physical therapy

## 2015-02-14 NOTE — Telephone Encounter (Signed)
Called and left a detailed message on VM for Clair Gulling to inform. Advised to contact the office in regards to this, since If I place the pt on outpatient PT she will not be able to make the appts. Tried calling pt and received no answer.

## 2015-02-15 ENCOUNTER — Telehealth: Payer: Self-pay | Admitting: Neurology

## 2015-02-15 NOTE — Telephone Encounter (Signed)
PT called and wanted to know if there was another place Dr Tat would recommend for neuro rehab because they can't get her in until 03/20/2015/Dawn CB#[phone]

## 2015-02-15 NOTE — Telephone Encounter (Signed)
Could you call the neurorehab center and find out if that is right (wait is a month)?  If so, I will address with director of center again

## 2015-02-15 NOTE — Telephone Encounter (Signed)
I called neurorehab and they tried to give her earlier appointment but the patient did not want anything until November 29th.

## 2015-02-16 NOTE — Telephone Encounter (Signed)
I think I am a little confused.  Luvenia Starch, can you assist?  I must be missing something.

## 2015-02-19 ENCOUNTER — Ambulatory Visit: Payer: Self-pay | Admitting: Gastroenterology

## 2015-02-19 NOTE — Telephone Encounter (Signed)
Spoke with patient and she states that the neurorehab center never offered her a sooner appt. She states they offered to put her on a cancellation list for a sooner appt. Made aware that Caryl Pina spoke with them. Patient will call back today to see about sooner appt and let me know if she is unable to get one.

## 2015-02-22 ENCOUNTER — Encounter: Payer: Self-pay | Admitting: Neurology

## 2015-02-22 ENCOUNTER — Ambulatory Visit (HOSPITAL_COMMUNITY)
Admission: RE | Admit: 2015-02-22 | Discharge: 2015-02-22 | Disposition: A | Discharge status: Home / Self Care | Payer: Medicare Other | Source: Ambulatory Visit | Attending: Neurology | Admitting: Neurology

## 2015-02-22 ENCOUNTER — Ambulatory Visit (INDEPENDENT_AMBULATORY_CARE_PROVIDER_SITE_OTHER): Payer: Medicare Other | Admitting: Neurology

## 2015-02-22 VITALS — BP 106/60 | HR 88 | Ht 70.0 in | Wt 153.0 lb

## 2015-02-22 DIAGNOSIS — G609 Hereditary and idiopathic neuropathy, unspecified: Secondary | ICD-10-CM

## 2015-02-22 DIAGNOSIS — R413 Other amnesia: Secondary | ICD-10-CM

## 2015-02-22 DIAGNOSIS — G20 Parkinson's disease: Secondary | ICD-10-CM | POA: Insufficient documentation

## 2015-02-22 DIAGNOSIS — R1310 Dysphagia, unspecified: Secondary | ICD-10-CM | POA: Insufficient documentation

## 2015-02-22 DIAGNOSIS — R1319 Other dysphagia: Secondary | ICD-10-CM

## 2015-02-22 DIAGNOSIS — G214 Vascular parkinsonism: Secondary | ICD-10-CM

## 2015-02-22 DIAGNOSIS — E538 Deficiency of other specified B group vitamins: Secondary | ICD-10-CM

## 2015-02-22 MED ORDER — CARBIDOPA-LEVODOPA 25-100 MG PO TABS
2.0000 | ORAL_TABLET | Freq: Once | ORAL | Status: AC
  Administered 2015-02-22: 2 via ORAL

## 2015-02-22 NOTE — Procedures (Signed)
Objective Swallowing Evaluation: Other (Comment)  Patient Details  Name: [name] MRN: [id number] Date of Birth: [birth year]  Today's Date: 02/22/2015 Time: SLP Start Time (ACUTE ONLY): 1305-SLP Stop Time (ACUTE ONLY): 1330 SLP Time Calculation (min) (ACUTE ONLY): 25 min  Past Medical History:  Past Medical History  Diagnosis Date  . Arthritis   . Hyperlipidemia   . 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 Chronic constipation   . Hemorrhoid   . Varicose veins   . Anxiety   . Depression   . GERD (gastroesophageal reflux disease)   . Atrial fibrillation ([place])   . Recurrent UTI   . Parkinson disease ([place])   . Cardiac arrhythmia due to congenital heart disease   . Mycotic toenails 10/27/2012   Past Surgical History:  Past Surgical History  Procedure Laterality Date  . Appendectomy  81 years old  . Colon resection  2008   HPI:  Other Pertinent Information: 81 yo female referred by Dr Tat for MBS.  PMH + for Parkinson's disease = diagnosed in 2011, GERD, COPD, pna, unintentional weight loss of 10 lbs in 3 months.  Pt reports issues with coughing during meals, coughing up mucus/phlegm, throat clearing and sensation of food/pills lodging in throat.  Pt reports when food/pill lodges in throat she has to relax and this helps to medicine to clear.  Pt admits to being on a PPI for approx one week with improvement in symptoms but she stopped taking it approx 10 days prior due to concerns for becoming addicted. Dysphagi has been ongoing for year but has worsened in intensity and frequency.  Issue can occur 1-3 times a day.  She denies requiring heimlich manuever.    No Data Recorded  Assessment / Plan / Recommendation CHL IP CLINICAL IMPRESSIONS 02/22/2015  Therapy Diagnosis Mild oral phase dysphagia  Clinical Impression Pt presents with minimal oral deficits characterized by lingual pumping while orally transiting boluses.  She did not have oral residuals however and was  functional.  Pharyngeal swallow was strong and timely with no aspiration or penetration nor significant residuals.    Barium tablet taken with thin momentarily halted in cervical esophagus but further liquid consumption faciliated clearance.   Pt did not have sensation to barium tablet momentarily halting.    Pt noted to clear her throat during testing and belch after - barium was not visualized in larynx during testing/throat clearing.    Pt admits to being on a PPI for approx one week with improvement in symptoms but she stopped taking it approx 10 days ago due to concerns for becoming addicted.  She admits symptoms have worsened since ceasing medication.   As pt reports issues with xerostomia, SLP provided her with written compensation strategies.  Also reviewed alternative ways to take medications if she senses sticking in throat.  Thanks for this referral.       CHL IP TREATMENT RECOMMENDATION 02/22/2015  Treatment Recommendations No treatment recommended at this time     CHL IP DIET RECOMMENDATION 02/22/2015  SLP Diet Recommendations Age appropriate regular solids;Thin  Liquid Administration via Cup, straw  Medication Administration Whole meds with liquid or with food if problematic  Compensations Small sips/bites;Slow rate  Postural Changes and/or Swallow Maneuvers Reflux precautions     CHL IP OTHER RECOMMENDATIONS 02/22/2015  Recommended Consults n/a  Oral Care Recommendations Oral care BID  Other Recommendations n/a            CHL IP REASON FOR REFERRAL 02/22/2015  Reason for Referral Objectively evaluate swallowing function     CHL IP ORAL PHASE 02/22/2015  Oral Phase Impaired      CHL IP PHARYNGEAL PHASE 02/22/2015  Pharyngeal Phase WFL      CHL IP CERVICAL ESOPHAGEAL PHASE 02/22/2015  Cervical Esophageal Phase (No Data)    CHL IP GO 02/22/2015  Functional Assessment Tool Used MBS  Functional Limitations Swallowing  Swallow Current Status (K8138) CI  Swallow Goal  Status (I7195) CI  Swallow Discharge Status (V7471) CI          Luanna Salk, [place] [facility] SLP [phone]

## 2015-02-22 NOTE — Progress Notes (Signed)
[name] was seen today in the movement disorders clinic for neurologic consultation at the request of Shanon Brow Marda Breidenbach.  Her PCP is Annye Asa, MD.  She is accompanied by her friend who supplements the history.  Prior records made available to me were reviewed.  The patient apparently was diagnosed with Parkinson's disease by Dr. Leta Baptist in 2011, but thinks she had symptoms long before that.  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  Dr. Gladstone Lighter records indicate that the patient was never very accepting of the diagnosis.  He did start her on levodopa but she only worked up to one tablet twice a day and ultimately ended up stopping the medication in February, 2014 after she didn't think that the medication was helpful.  She states "I didn't accept the diagnosis and I don't like to take medication."  She really has not had any neurology follow-up since that time.  She did have an appointment with me in September, 2015 but she canceled that.  02/22/15 update:  The patient is following up today as she will be having a levodopa challenge test.  She is not on any levodopa at home.  Last visit, I did schedule a modified barium swallow test.  This was done earlier today, and it was normal.  She was found to be mildly B12 deficient at our last visit (221) and I asked her to start a oral B12 supplement.  Physical therapy will start on November 16.  PREVIOUS MEDICATIONS: Sinemet  ALLERGIES:   Allergies  Allergen Reactions  . Adhesive [Tape] Other (See Comments)    Caused blisters  . Amoxicillin Other (See Comments)    Caused thrush  . Ciprofloxacin Other (See Comments)    Caused thrush  . Ultram [Tramadol] Other (See Comments)    hypotension     CURRENT MEDICATIONS:  Outpatient Encounter Prescriptions as of 02/22/2015  Medication Sig  . albuterol (VENTOLIN HFA) 108 (90 BASE) MCG/ACT inhaler Inhale 2 puffs into the lungs 2 (two) times daily as needed for wheezing or shortness of breath.  Marland Kitchen apixaban (ELIQUIS) 5 MG TABS tablet Take 5 mg by mouth 2 (two) times daily.  . Ascorbic Acid (VITAMIN C) 1000 MG tablet Take 1,000 mg by mouth daily.  . budesonide-formoterol (SYMBICORT) 160-4.5 MCG/ACT inhaler Inhale 2 puffs into the lungs 2 (two) times daily.  . Calcium Carb-Cholecalciferol (CALCIUM 1000 + D PO) Take 1,000 mg by mouth daily.  . celecoxib (CELEBREX) 100 MG capsule Take 100 mg by mouth daily as needed (back pain).  Marland Kitchen diltiazem (CARDIZEM) 30 MG tablet Take 1 tablet (30 mg total) by mouth 4 (four) times daily. Take at onset on atrial fibrillation  . flecainide (TAMBOCOR) 100 MG tablet Take 1 tablet (100 mg total) by mouth 2 (two) times daily. Take at onset of atrial fibrillation  . sertraline (ZOLOFT) 100 MG tablet Take 150 mg by mouth daily.  Marland Kitchen trimethoprim (TRIMPEX) 100 MG tablet Take 100 mg by mouth daily.   No facility-administered encounter medications on file as of 02/22/2015.    PAST MEDICAL HISTORY:   Past Medical History  Diagnosis Date  . Arthritis   . Hyperlipidemia   . 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 Chronic  constipation   . Hemorrhoid   . Varicose veins   . Anxiety   . Depression   . GERD (gastroesophageal reflux disease)   . Atrial fibrillation ([place])   . Recurrent UTI   . Parkinson disease ([place])   . Cardiac arrhythmia due to congenital heart disease   . Mycotic toenails 10/27/2012    PAST SURGICAL HISTORY:   Past Surgical History  Procedure Laterality Date  . Appendectomy  81 years old  . Colon resection  2008    SOCIAL HISTORY:   Social History   Social History  . Marital Status: Widowed    Spouse Name: N/A  . Number of Children: N/A  . Years of Education: N/A   Occupational  History  . Not on file.   Social History Main Topics  . Smoking status: Former Smoker -- 2.00 packs/day for 30 years    Types: Cigarettes    Quit date: 04/21/1980  . Smokeless tobacco: Never Used     Comment: onset age 45 -45, up to > 1ppd (almost 2 ppd)  . Alcohol Use: No  . Drug Use: No  . Sexual Activity: No   Other Topics Concern  . Not on file   Social History Narrative   Widowed, lives alone. 1 child in [place] and 1 in [place].   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    FAMILY HISTORY:   Family Status  Relation Status Death Age  . Brother Deceased     asthma  . Father Deceased     throat cancer  . Daughter Alive   . Daughter Alive   . Sister Deceased     breast cancer  . Mother Deceased 12    arthritis     ROS:  A complete 10 system review of systems was obtained and was unremarkable apart from what is mentioned above.  PHYSICAL EXAMINATION:    VITALS:   Filed Vitals:   02/22/15 1418  BP: 106/60  Pulse: 88  Height: '5\' 10"'$  (1.778 m)  Weight: 153 lb (69.4 kg)    GEN:  The patient appears stated age and is in NAD. HEENT:  Normocephalic, atraumatic.  The mucous membranes are moist. The superficial temporal arteries are without ropiness or tenderness. CV:  RRR Lungs:  CTAB Neck/HEME:  There are no carotid bruits bilaterally.  Neurological examination:  Orientation: The patient is alert and oriented x3. Fund of knowledge is appropriate.  Recent and remote memory are intact.  Attention and concentration are normal.    Able to name objects and repeat phrases. Cranial nerves: There is good facial symmetry.  There is no significant facial hypomimia.  Pupils are equal round and reactive to light bilaterally. Fundoscopic exam reveals clear margins bilaterally. Extraocular muscles are intact. The visual fields are full to confrontational testing. The speech is fluent and clear.  She has not particularly hypophonic.  She has no difficulty with guttural sounds.  Soft palate  rises symmetrically and there is no tongue deviation. Hearing is intact to conversational tone. Sensation: Sensation is intact to light and pinprick throughout (facial, trunk, extremities). Vibration is markedly decreased in a distal fashion. There is no extinction with double simultaneous stimulation. There is no sensory dermatomal level identified. Motor: Strength is 5/5 in the bilateral upper and lower extremities.   Shoulder shrug is equal and symmetric.  There is no pronator drift. Deep tendon reflexes: Deep tendon reflexes are 2/4 at the bilateral biceps, triceps, brachioradialis, 3/4 at the bilateral patella with cross  adductor reflex present and trace at the bilateral achilles. Plantar responses are downgoing bilaterally.  Movement examination: Tone: It is very difficult to assess the patient's tone due to gegenhalten.  Abnormal movements: The patient does have a very minimal tremor at rest and minimal with outstretched hands Coordination:  There is no significant decremation with RAM's, with any form of RAMS, including alternating supination and pronation of the forearm, hand opening and closing, finger taps, heel taps and toe taps. Gait and Station: The patient has minimal difficulty arising out of a deep-seated chair without the use of the hands. The patient's stride length is slightly decreased with a stooped posture.  The patient has a negative pull test.      A completed UPDRS motor on/off test (levodopa challenge) was done today.  Her UPDRS motor off score was only 16.  She was then given 200 mg of levodopa dissolved in ginger ale and re-examined after 50 minutes.  Her UPDRS motor on score was 11.  While there was some mild improvement, it didn't improve gait at all which is the real part where she has trouble.   Labs:  Lab Results  Component Value Date   [id number] 790 02/12/2015   Lab Results  Component Value Date   HGBA1C 5.7 02/12/2015     ASSESSMENT/PLAN:  1.   Parkinsonism  -While the patient does have some evidence of parkinsonism, it is likely not idiopathic Parkinson's disease, but rather vascular parkinsonism.  An MRI of the brain from August, 2015 was reviewed today and there was a significant amount of cerebral small vessel disease.  UPDRS motor on/off test on 02/22/15 showed only minimal improvement with levodopa and no improvement in walking, which is where she has most of her issues.  I did recommend that she start physical therapy that is scheduled on November 16.  -Talked about the importance of continued exercise.  She was going to do [facility] but has not been going lately.  I encouraged her to start attending again. 2.  Dysphagia  -MBE done on 02/22/15 was normal 3.  Gait instability  -I think that this is multifactorial.  While this can because by parkinsonism, she has evidence of very significant peripheral neuropathy.  She was mildly deficient in B12 and is now taking a supplement. 4.  Memory change  -She wanted to make a further follow-up appointment in the future just to discuss this issue alone. 5.  I will see her back in the next few months, sooner should new neurologic issues arise.  Much greater than 50% of this 60 minute visit (not including wait time) was in counseling and coordinating care

## 2015-02-22 NOTE — Patient Instructions (Addendum)
1.  You have vascular parkinsonism and not idiopathic parkinsons disease.  You do not have lewy body disease.  Vascular parkinsonism is caused by hardening of the arteries in the brain and NOT a dopamine problem.  I do not recommend medication for you as we just showed that it was not that helpful 2.  I do recommend physical therapy 3.  We will talk more about memory loss at our next visit 4.  B12- 1098mg daily

## 2015-02-27 DIAGNOSIS — R0602 Shortness of breath: Secondary | ICD-10-CM | POA: Diagnosis not present

## 2015-03-01 ENCOUNTER — Other Ambulatory Visit: Payer: Self-pay

## 2015-03-01 ENCOUNTER — Encounter: Payer: Self-pay | Admitting: Behavioral Health

## 2015-03-01 ENCOUNTER — Telehealth: Payer: Self-pay | Admitting: Behavioral Health

## 2015-03-01 NOTE — Telephone Encounter (Signed)
Pre-Visit Call completed with patient and chart updated.   Pre-Visit Info documented in Specialty Comments under [employer].    

## 2015-03-02 ENCOUNTER — Ambulatory Visit (INDEPENDENT_AMBULATORY_CARE_PROVIDER_SITE_OTHER): Payer: Medicare Other | Admitting: Internal Medicine

## 2015-03-02 ENCOUNTER — Encounter: Payer: Self-pay | Admitting: Internal Medicine

## 2015-03-02 VITALS — BP 118/62 | HR 64 | Temp 98.2°F | Ht 70.0 in | Wt 156.0 lb

## 2015-03-02 DIAGNOSIS — J438 Other emphysema: Secondary | ICD-10-CM

## 2015-03-02 DIAGNOSIS — I4891 Unspecified atrial fibrillation: Secondary | ICD-10-CM | POA: Diagnosis not present

## 2015-03-02 DIAGNOSIS — Z09 Encounter for follow-up examination after completed treatment for conditions other than malignant neoplasm: Secondary | ICD-10-CM | POA: Insufficient documentation

## 2015-03-02 MED ORDER — ZOSTER VACCINE LIVE 19400 UNT/0.65ML ~~LOC~~ SOLR: 0.6500 mL | Freq: Once | SUBCUTANEOUS | Status: DC

## 2015-03-02 MED ORDER — TIOTROPIUM BROMIDE MONOHYDRATE 18 MCG IN CAPS: 18.0000 ug | ORAL_CAPSULE | Freq: Every day | RESPIRATORY_TRACT | Status: DC

## 2015-03-02 MED ORDER — OMEPRAZOLE 20 MG PO CPDR: 20.0000 mg | DELAYED_RELEASE_CAPSULE | Freq: Every day | ORAL | Status: DC

## 2015-03-02 NOTE — Progress Notes (Signed)
Subjective:    Patient ID: [name], female    DOB: March 22, 1928, 81 y.o.   MRN: [id number]  DOS:  03/02/2015 Type of visit - description : Transferring care from previous PCP Interval history: COPD: More DOE since recent exhacerbation. Likes to  optimize her treatment Atrial fibrillation: Good compliance with medication, no recent episodes. Diabetes: Continue with occasional diarrhea.  Review of Systems  No recent fever chills. + Postnasal dripping No chest pain, lower extremity edema or palpitations. No nausea, vomiting or blood in the stools. Occasional GERD symptoms. + Cough most mornings with clear sputum.  Past Medical History  Diagnosis Date  . Arthritis   . Hyperlipidemia   . 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 Chronic constipation   . Hemorrhoid   . Varicose veins   . Anxiety   . Depression   . GERD (gastroesophageal reflux disease)   . Atrial fibrillation ([place])   . Recurrent UTI   . Parkinson disease ([place])   . Cardiac arrhythmia due to congenital heart disease   . Mycotic toenails 10/27/2012  . COPD (chronic obstructive pulmonary disease) ([place])   . Parkinsonism [facility])     Past Surgical History  Procedure Laterality Date  . Appendectomy  81 years old  . Colon resection  2008    Social History   Social History  . Marital Status: Widowed    Spouse Name: N/A  . Number of Children: 2  . Years of Education: N/A   Occupational History  . retired    Social History Main Topics  . Smoking status: Former Smoker -- 2.00 packs/day for 30 years    Types: Cigarettes    Quit date: 04/21/1980  . Smokeless tobacco: Never Used     Comment: onset age 46 -60, up to > 1ppd (almost 2 ppd)  . Alcohol Use: No  . Drug Use: No  . Sexual Activity: No   Other Topics Concern  . Not on file   Social History Narrative   Widowed, lives alone. 1 child in [place] and 1 in [place].   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        Medication List       This list is  accurate as of: 03/02/15 12:56 PM.  Always use your most recent med list.               albuterol 108 (90 BASE) MCG/ACT inhaler  Commonly known as:  VENTOLIN HFA  Inhale 2 puffs into the lungs 2 (two) times daily as needed for wheezing or shortness of breath.     apixaban 5 MG Tabs tablet  Commonly known as:  ELIQUIS  Take 5 mg by mouth 2 (two) times daily.     budesonide-formoterol 160-4.5 MCG/ACT inhaler  Commonly known as:  SYMBICORT  Inhale 2 puffs into the lungs 2 (two) times daily.     CALCIUM 1000 + D PO  Take 1,000 mg by mouth daily.     celecoxib 100 MG capsule  Commonly known as:  CELEBREX  Take 100 mg by mouth daily as needed (back pain).     diltiazem 30 MG tablet  Commonly known as:  CARDIZEM  Take 1 tablet (30 mg total) by mouth 4 (four) times daily. Take at onset on atrial fibrillation     flecainide 100 MG tablet  Commonly known as:  TAMBOCOR  Take 1 tablet (100 mg total) by mouth 2 (two) times daily. Take at onset of atrial fibrillation  omeprazole 20 MG capsule  Commonly known as:  PRILOSEC  Take 1 capsule (20 mg total) by mouth daily.     sertraline 100 MG tablet  Commonly known as:  ZOLOFT  Take 150 mg by mouth daily.     tiotropium 18 MCG inhalation capsule  Commonly known as:  SPIRIVA HANDIHALER  Place 1 capsule (18 mcg total) into inhaler and inhale daily.     trimethoprim 100 MG tablet  Commonly known as:  TRIMPEX  Take 100 mg by mouth daily.     vitamin B-12 1000 MCG tablet  Commonly known as:  CYANOCOBALAMIN  Take 1,000 mcg by mouth daily.     vitamin C 1000 MG tablet  Take 1,000 mg by mouth daily.     zoster vaccine live (PF) 19400 UNT/0.65ML injection  Commonly known as:  ZOSTAVAX  Inject 19,400 Units into the skin once.           Objective:   Physical Exam BP 118/62 mmHg  Pulse 64  Temp(Src) 98.2 F (36.8 C) (Oral)  Ht '5\' 10"'$  (1.778 m)  Wt 156 lb (70.761 kg)  BMI 22.38 kg/m2  SpO2 97% General:   Well developed,  well nourished . NAD.  HEENT:  Normocephalic . Face symmetric, atraumatic Lungs:  Decreased breath sounds Normal respiratory effort, no intercostal retractions, no accessory muscle use. Heart: RRR,  no murmur.  No pretibial edema bilaterally  Skin: Not pale. Not jaundice Neurologic:  alert & oriented X3.  Speech normal, gait appropriate for age and unassisted Psych--  Cognition and judgment appear intact.  Cooperative with normal attention span and concentration.  Behavior appropriate. No anxious or depressed appearing.      Assessment & Plan:   Assessment > Hyperlipidemia Anxiety depression  Dr Ferdinand Lango (psych) COPD   P-Atrial fibrillation Dr Gerrit Friends, flecanaide/cardiazem prn GI: --GERD --Abnormal abdominal US mild pancreatic duct enlargement  --IBS used to be constipated, now diarrhea predominant DJD take Celebrex very seldom Recurrent UTIs -- qd abx , has seenn urology Dr Edwena Blow before Parkinson disease  Dr Tat admitted  01-2015: COPD and RVR  PLAN Atrial fibrillation:  good compliance with medication without apparent problems. COPD: Still having frequent cough or DOE in the last few months. To optimize her treatment I recommend the following: Consistent use of Flonase , PPIs every day, add Spiriva. Mucinex DM as needed. Follow-up in 3 months. Also recently had a 24-hour pulse oximetry. Report pending Primary care: Extensive chart review since I am assuming her care. She is up-to-date on immunizations ,  blood work reviewed as well  noting no recent FLP. Provided a Zostavax prescription. RTC 3 months  Today, I spent more than 30   min with the patient: >50% of the time counseling regards  optimization of COPD treatment, multiple questions answered regarding these issue. Also reviewed the chart.

## 2015-03-02 NOTE — Assessment & Plan Note (Signed)
Atrial fibrillation:  good compliance with medication without apparent problems. COPD: Still having frequent cough or DOE in the last few months. To optimize her treatment I recommend the following: Consistent use of Flonase , PPIs every day, add Spiriva. Mucinex DM as needed. Follow-up in 3 months. Also recently had a 24-hour pulse oximetry. Report pending Primary care: Extensive chart review since I am assuming her care. She is up-to-date on immunizations ,  blood work reviewed as well  noting no recent FLP. Provided a Zostavax prescription. RTC 3 months

## 2015-03-02 NOTE — Patient Instructions (Signed)
Continue with the same medications, also:  Add Spiriva once a day to help control your emphysema  Omeprazole 1 tablet every morning before breakfast  Use Flonase 2 sprays in each side of the nose every morning  Next visit in 3 months

## 2015-03-02 NOTE — Progress Notes (Signed)
Pre visit review using our clinic review tool, if applicable. No additional management support is needed unless otherwise documented below in the visit note. 

## 2015-03-07 ENCOUNTER — Ambulatory Visit: Payer: Medicare Other | Attending: Family Medicine | Admitting: Physical Therapy

## 2015-03-07 DIAGNOSIS — R2681 Unsteadiness on feet: Secondary | ICD-10-CM

## 2015-03-07 DIAGNOSIS — R293 Abnormal posture: Secondary | ICD-10-CM

## 2015-03-07 DIAGNOSIS — R0602 Shortness of breath: Secondary | ICD-10-CM | POA: Diagnosis not present

## 2015-03-07 DIAGNOSIS — R269 Unspecified abnormalities of gait and mobility: Secondary | ICD-10-CM

## 2015-03-08 ENCOUNTER — Ambulatory Visit (INDEPENDENT_AMBULATORY_CARE_PROVIDER_SITE_OTHER): Payer: Medicare Other | Admitting: Psychiatry

## 2015-03-08 ENCOUNTER — Telehealth: Payer: Self-pay | Admitting: Internal Medicine

## 2015-03-08 DIAGNOSIS — F419 Anxiety disorder, unspecified: Secondary | ICD-10-CM | POA: Diagnosis not present

## 2015-03-08 NOTE — Telephone Encounter (Signed)
Please advise 

## 2015-03-08 NOTE — Telephone Encounter (Signed)
Relation to TE:IHDT Call back number:[phone] Pharmacy: [facility] [place] [place] [facility] [address] [facility] [phone] (Phone) [phone] (Fax)         Reason for call:  Patient states therapist advised changing anti depressant medication: sertraline (ZOLOFT) 100 MG tablet due to patient being depressed. Patient would like to be prescribed anti depressant medication generic brand please send to retail and patient would like a  month work of pills to see how it makes her feel, patient was last seen 03/02/15 and scheduled follow up for 06/03/14.

## 2015-03-08 NOTE — Telephone Encounter (Signed)
Options like  Wellbutrin may interact with flecainide,I'll have to discuss with cardiology tomorrow (Dr Einar Gip)

## 2015-03-09 MED ORDER — BUPROPION HCL 100 MG PO TABS: ORAL_TABLET | ORAL | Status: DC

## 2015-03-09 NOTE — Telephone Encounter (Signed)
I spoke with cardiology, they recommend  a low dose of flecainide to prevent problems with concomitant use of Wellbutrin. I called the patient, plan is as follows:   Decrease flecainide from 100 mg twice a day when necessary to 50 mg daily when necessary  Wellbutrin 100 mg twice a day #60, no refills. (In addition to sertraline) First 10 days take only one Wellbutrin at night. RTC 4 weeks to 6 weeks  She verbalized understanding

## 2015-03-10 NOTE — Therapy (Signed)
[name] [address] [place], [place], [address] Phone: [phone]   Fax:  [phone]  Physical Therapy Evaluation  Patient Details  Name: [name] MRN: [id number] Date of Birth: Sep 18, 1927 Referring Provider: Alonza Bogus, DO  Encounter Date: 03/07/2015      PT End of Session - 03/10/15 1016    Visit Number 1   Number of Visits 17   Date for PT Re-Evaluation 05/06/15   Authorization Type Medicare-Gcode every 10th visit; BCBS secondary   PT Start Time 0934   PT Stop Time 1015   PT Time Calculation (min) 41 min   Equipment Utilized During Treatment Gait belt   Activity Tolerance Patient tolerated treatment well   Behavior During Therapy [facility] for tasks assessed/performed      Past Medical History  Diagnosis Date  . Arthritis   . Hyperlipidemia   . 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 Chronic constipation   . Hemorrhoid   . Varicose veins   . Anxiety   . Depression   . GERD (gastroesophageal reflux disease)   . Atrial fibrillation ([place])   . Recurrent UTI   . Parkinson disease ([place])   . Cardiac arrhythmia due to congenital heart disease   . Mycotic toenails 10/27/2012  . COPD (chronic obstructive pulmonary disease) ([place])   . Parkinsonism [facility])     Past Surgical History  Procedure Laterality Date  . Appendectomy  81 years old  . Colon resection  2008    There were no vitals filed for this visit.  Visit Diagnosis:  Abnormality of gait  Unsteadiness  Postural instability      Subjective Assessment - 03/10/15 0954    Subjective Pt is an 81 year old female with Parkinsonism, with history of pneumonia in June 2016, with atrial fibrillation/COPD exacerbation in October 2016.  She has typically been exercising regularly at [facility]; however, she has not been in the past several months due to ongoing medical issues.  Pt reports no falls.  She just reports more weakness over  the past several months.  She has been using a cane, but no longer needs that regularly.  She has a rollator walker.   Patient Stated Goals Pt's goal for therapy is to have better balance.   Currently in Pain? Yes   Pain Score 5    Pain Location Back   Pain Orientation Left;Lower   Pain Descriptors / Indicators Aching   Pain Type Chronic pain   Pain Onset More than a month ago   Pain Frequency Constant   Aggravating Factors  Lifting heavy things aggravates pain   Pain Relieving Factors Chiropractor visit once per month, low back exercises alleviate  PT will monitor pain, but not address as a goal at this time.            [facility] PT Assessment - 03/10/15 0001    Assessment   Medical Diagnosis Parkinson's disease   Referring Provider Wells Guiles Tat, DO   Onset Date/Surgical Date --  pnemonia June 2016   Precautions   Precautions Fall   Balance Screen   Has the patient fallen in the past 6 months No   Has the patient had a decrease in activity level because of a fear of falling?  Yes   Is the patient reluctant to leave their home because of a fear of falling?  No   Home Social worker Private residence   Living Arrangements Alone  Available Help at Discharge Friend(s)   Type of Home House  Townhouse 2 floors   Home Access Stairs to enter   Entrance Stairs-Number of Steps 2   Entrance Stairs-Rails Left   Home Layout Two level;Bed/bath upstairs   [place] - 4 wheels;Cane - single point   Prior Function   Level of Independence --  fatigues easily; once a month cleaning person   Vocation Retired   Leisure Enjoys exercising at [employer], reading  Previously used treadmill at [facility]   ROM / Strength   AROM / PROM / Strength Strength   Strength   Overall Strength Comments grossly tested at least 4/5, with R ankle dorsiflexion 3+/5   Transfers   Transfers Sit to Stand;Stand to Sit  Reports increased difficulty with low surface transfers   Sit to Stand 6:  Modified independent (Device/Increase time);Without upper extremity assist;From chair/3-in-1   Five time sit to stand comments  13.20   Stand to Sit 6: Modified independent (Device/Increase time);Without upper extremity assist;To chair/3-in-1   Ambulation/Gait   Ambulation/Gait Yes   Ambulation/Gait Assistance 5: Supervision   Ambulation Distance (Feet) 150 Feet   Assistive device None   Gait Pattern Step-through pattern;Decreased arm swing - right;Decreased arm swing - left;Decreased step length - right;Decreased step length - left;Narrow base of support;Decreased trunk rotation  quick pace walking   Ambulation Surface Level;Indoor   Gait velocity 12.2 sec= 2.69 ft/sec   Gait velocity - backwards 0.45 ft/sec   Standardized Balance Assessment   Standardized Balance Assessment Timed Up and Go Test   Timed Up and Go Test   Normal TUG (seconds) 14.77   Manual TUG (seconds) 16.07   Cognitive TUG (seconds) 14.37   Functional Gait  Assessment   Gait assessed  Yes   Gait Level Surface Walks 20 ft, slow speed, abnormal gait pattern, evidence for imbalance or deviates 10-15 in outside of the 12 in walkway width. Requires more than 7 sec to ambulate 20 ft.  8.28 sec   Change in Gait Speed Able to change speed, demonstrates mild gait deviations, deviates 6-10 in outside of the 12 in walkway width, or no gait deviations, unable to achieve a major change in velocity, or uses a change in velocity, or uses an assistive device.   Gait with Horizontal Head Turns Performs head turns with moderate changes in gait velocity, slows down, deviates 10-15 in outside 12 in walkway width but recovers, can continue to walk.   Gait with Vertical Head Turns Performs task with moderate change in gait velocity, slows down, deviates 10-15 in outside 12 in walkway width but recovers, can continue to walk.   Gait and Pivot Turn Pivot turns safely within 3 sec and stops quickly with no loss of balance.   Step Over Obstacle  Is able to step over one shoe box (4.5 in total height) but must slow down and adjust steps to clear box safely. May require verbal cueing.   Gait with Narrow Base of Support Ambulates less than 4 steps heel to toe or cannot perform without assistance.   Gait with Eyes Closed Walks 20 ft, slow speed, abnormal gait pattern, evidence for imbalance, deviates 10-15 in outside 12 in walkway width. Requires more than 9 sec to ambulate 20 ft.  17.81 sec   Ambulating Backwards Walks 20 ft, slow speed, abnormal gait pattern, evidence for imbalance, deviates 10-15 in outside 12 in walkway width.  22.34 sec in 10 ft   Steps Alternating feet,  must use rail.   Total Score 13   FGA comment: Scores <15/30 are indicative of increased fall risk.                             PT Short Term Goals - 03/10/15 1029    PT SHORT TERM GOAL #1   Title Pt will perform HEP independently, to address Parkinson's specific deficits, for improved functional mobility.  TARGET 04/06/15   Time 4   Period Weeks   Status New   PT SHORT TERM GOAL #2   Title Pt will perform sit<>stand transfers from 18 inches or below, at least 8 of 10 trials without UE support, for improved transfer efficiency, safety and functional strength.   Time 4   Period Weeks   Status New   PT SHORT TERM GOAL #3   Title Pt will improve TUG score to less than or equal to 13.5 seconds for improved turns with gait and decreased fall risk.   Time 4   Period Weeks   Status New   PT SHORT TERM GOAL #4   Title Pt will improve Functional Gait Assessment to at least 16/30 for decreased fall risk.   Time 4   Period Weeks   Status New   PT SHORT TERM GOAL #5   Title Pt will verbalize understanding of local Parkinson's disease resources.   Time 4   Period Weeks   Status New           PT Long Term Goals - 03/10/15 1032    PT LONG TERM GOAL #1   Title Pt will verbalize understanding of fall prevention within the home environment.   TARGET 05/06/15   Time 8   Period Weeks   Status New   PT LONG TERM GOAL #2   Title Pt will improve gait velocity to at least 3 ft/sec for improved safety and efficiency with gait.   Time 8   Period Weeks   Status New   PT LONG TERM GOAL #3   Title Pt will improve TUG manual score to less than or equal to 14.5 seconds for improved dual tasking with gait and decreased fall risk.   Time 8   Period Weeks   Status New   PT LONG TERM GOAL #4   Title Pt will improve Functional Gait Assessment to at least 20/30 for decreased fall risk.   Time 8   Period Weeks   Status New   PT LONG TERM GOAL #5   Title Pt will verbalize understanding of optimal community and ongoing fitness activities upon D/C from PT.   Time 8   Period Weeks   Status New               Plan - 03/10/15 1017    Clinical Impression Statement Pt is an 81 year old female who presents to OP PT with history of Parkinsonism.  She has had decline in function since June 2016, due to bout of pneuomonia and then COPD/a-fib in October 2016, which has overall decreased her activity and exercise level.  She presents today with decreased balance, decreased timing and coordination of gait, decreased ability for dual tasking with gait, bradykinesia, difficulty transferring from low surfaces, forward flexed posture.  Pt would benefit from skilled physical therapy to address the above stated deficits to improve functional mobility, decrease fall risk and assist pt with transition back into regular community exercise.  Pt will benefit from skilled therapeutic intervention in order to improve on the following deficits Abnormal gait;Decreased balance;Decreased mobility;Decreased strength;Decreased coordination;Difficulty walking;Postural dysfunction   Rehab Potential Good   PT Frequency 2x / week   PT Duration 8 weeks  plus eval   PT Treatment/Interventions ADLs/Self Care Home Management;Therapeutic exercise;Therapeutic  activities;Functional mobility training;Gait training;Balance training;Neuromuscular re-education;Patient/family education   PT Next Visit Plan Initiate HEP for PWR! Moves in sitting, standing; sit<>stand transfers, walking program to improve step length and arm swing   Consulted and Agree with Plan of Care Patient         Problem List Patient Active Problem List   Diagnosis Date Noted  . PCP NOTES >>>> 03/02/2015  . Atrial fibrillation with RVR ([place]) 01/31/2015  . Elevated troponin 01/31/2015  . Cough with sputum 01/31/2015  . COPD (chronic obstructive pulmonary disease) ([place]) 01/31/2015  . Lower abdominal pain 12/05/2014  . Acute bacterial bronchitis 10/04/2014  . Other fatigue 06/21/2014  . Lightheadedness 06/12/2014  . Concussion 11/21/2013  . Fall from other slipping, tripping, or stumbling 11/17/2013  . Concussion without loss of consciousness 11/17/2013  . COPD exacerbation ([place]) 09/26/2013  . Sinusitis, acute maxillary 07/19/2012  . Musculoskeletal pain 05/21/2012  . IBS (irritable bowel syndrome) 04/02/2012  . Nail fungus 03/01/2012  . Ear bleeding 12/10/2011  . Generalized anxiety disorder 07/10/2011  . Parkinson's disease ([place]) 02/03/2011  . Shoulder pain 02/03/2011  . Recurrent UTI 11/01/2010  . GERD (gastroesophageal reflux disease) 11/01/2010  . Memory loss 11/01/2010  . COPD, mild ([place]) 09/23/2010  . Hip pain, right 09/23/2010  . COMPRESSION FRACTURE, LUMBAR VERTEBRAE 03/28/2010  . Abdominal pain 03/27/2010  . LACERATION, SCALP 03/27/2010  . Atrial fibrillation ([place]) 03/22/2010  . DIZZINESS 03/22/2010  . Mycotic toenails 10/02/2009  . DIARRHEA-PRESUMED INFECTIOUS 04/02/2009  . DYSPNEA ON EXERTION 01/19/2009  . HEMORRHOIDS-INTERNAL 02/14/2008  . CONSTIPATION 02/14/2008  . PERSONAL HX COLONIC POLYPS 02/14/2008    Tejay Hubert W. 03/10/2015, 10:38 AM  Frazier Butt., PT  [facility] [address] [place], [place], [address] Phone: [phone]   Fax:  [phone]  Name: [name] MRN: [id number] Date of Birth: 05-23-1927

## 2015-03-14 ENCOUNTER — Telehealth: Payer: Self-pay | Admitting: Internal Medicine

## 2015-03-14 NOTE — Telephone Encounter (Signed)
Advise patient and family member: She does not qualify for oxygen per Medicare guidelines.

## 2015-03-14 NOTE — Telephone Encounter (Signed)
Encounter closed by mistake, please see below.

## 2015-03-14 NOTE — Telephone Encounter (Signed)
We rx a O2 sat monitor, please get results to see if she qualifies for oxygen

## 2015-03-14 NOTE — Telephone Encounter (Signed)
Received O2 overnight oximetry from Dr. Birdie Riddle, forwarded to Dr. Larose Kells. Spoke with [facility] was informed Medicare guidelines to qualify for overnight O2 is an "accumulation of 5 minutes of less than 88 % O2."

## 2015-03-14 NOTE — Telephone Encounter (Signed)
Caller name: Daughter Kennyth Lose   Can be reached: [phone]  Reason for call:  Daughter requesting that patient have order for Oxygen at home.

## 2015-03-14 NOTE — Telephone Encounter (Signed)
[place], Pt's daughter, that Pt did not qualify for O2 per Medicare guidelines. Informed that her oximetry results showed she only fell below 88 % for 20 seconds, and would need to fall below 88 % for 5 minutes or more to qualify for O2. Informed her that results can be discussed further at next OV. Instructed Pt's daughter to call if she has any further questions or concerns.

## 2015-03-14 NOTE — Telephone Encounter (Signed)
Please advise 

## 2015-03-14 NOTE — Telephone Encounter (Signed)
Pt's daughter called back in, informed her of the message below.    Pt's daughter expressed understanding.     Thanks.

## 2015-03-19 ENCOUNTER — Telehealth: Payer: Self-pay | Admitting: Internal Medicine

## 2015-03-19 MED ORDER — FLUOXETINE HCL 20 MG PO TABS: ORAL_TABLET | ORAL | Status: DC

## 2015-03-19 NOTE — Telephone Encounter (Signed)
(  Requested a generic ). Call pt, let her know Stop Zoloft Start fluoxetine:1 tablet daily for one week, 1.5 tablets daily for one week, then 2 tablets every day Prescription sent already, arrange a office visit in 4 weeks

## 2015-03-19 NOTE — Telephone Encounter (Signed)
Please advise 

## 2015-03-19 NOTE — Telephone Encounter (Signed)
LMOM informing Pt to d/c Zoloft and start fluoxetine, instructed her how to take medication as prescribed by Dr. Larose Kells. Informed her to call office to scheduled F/U appt for 4 weeks, also informed her to let us know if she has any further questions or concerns.

## 2015-03-19 NOTE — Telephone Encounter (Signed)
Can be reached: [phone] [facility] [place] [place]  Reason for call: Pt said she started taking buPROPion (WELLBUTRIN) 100 MG tablet 03/10/15 but stopped taking 03/17/15. She said she felt dizzy and not well at all, not herself. She said that she would like to know what to do. She is wanting to do something instead of sertraline + another med. She would like 1 med in place of it.

## 2015-03-20 ENCOUNTER — Ambulatory Visit: Payer: Self-pay

## 2015-03-20 ENCOUNTER — Ambulatory Visit: Payer: Medicare Other | Admitting: Physical Therapy

## 2015-03-20 ENCOUNTER — Telehealth: Payer: Self-pay | Admitting: Internal Medicine

## 2015-03-20 ENCOUNTER — Ambulatory Visit: Payer: Self-pay | Admitting: Physical Therapy

## 2015-03-20 DIAGNOSIS — R269 Unspecified abnormalities of gait and mobility: Secondary | ICD-10-CM

## 2015-03-20 DIAGNOSIS — R293 Abnormal posture: Secondary | ICD-10-CM | POA: Diagnosis not present

## 2015-03-20 DIAGNOSIS — R2681 Unsteadiness on feet: Secondary | ICD-10-CM | POA: Diagnosis not present

## 2015-03-20 MED ORDER — FLUOXETINE HCL 20 MG PO TABS
ORAL_TABLET | ORAL | Status: DC
Start: 2015-03-20 — End: 2015-03-20

## 2015-03-20 MED ORDER — FLUOXETINE HCL 20 MG PO CAPS: 40.0000 mg | ORAL_CAPSULE | Freq: Every day | ORAL | Status: DC

## 2015-03-20 NOTE — Therapy (Signed)
[facility] [address] [place], [place], [address] Phone: [phone]   Fax:  [phone]  Physical Therapy Treatment  Patient Details  Name: NORIKO MACARI MRN: [id number] Date of Birth: [birth year] Referring Provider: Alonza Bogus, DO  Encounter Date: 03/20/2015      PT End of Session - 03/20/15 2230    Visit Number 2   Number of Visits 17   Date for PT Re-Evaluation 05/06/15   PT Start Time 0849   PT Stop Time 0930   PT Time Calculation (min) 41 min   Activity Tolerance Patient tolerated treatment well   Behavior During Therapy [facility] for tasks assessed/performed      Past Medical History  Diagnosis Date  . Arthritis   . Hyperlipidemia   . 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 Chronic constipation   . Hemorrhoid   . Varicose veins   . Anxiety   . Depression   . GERD (gastroesophageal reflux disease)   . Atrial fibrillation ([place])   . Recurrent UTI   . Parkinson disease ([place])   . Cardiac arrhythmia due to congenital heart disease   . Mycotic toenails 10/27/2012  . COPD (chronic obstructive pulmonary disease) ([place])   . Parkinsonism [facility])     Past Surgical History  Procedure Laterality Date  . Appendectomy  81 years old  . Colon resection  2008    There were no vitals filed for this visit.  Visit Diagnosis:  Abnormality of gait  Unsteadiness  Postural instability      Subjective Assessment - 03/20/15 0857    Subjective Pt has questions regarding insurance coverage for therapy.  Reports no concerns.  Wishes she could get back to her activity level with exercise.   Currently in Pain? Yes   Pain Score 4    Pain Location Back   Pain Orientation Left;Lower   Pain Descriptors / Indicators Aching   Pain Type Chronic pain   Pain Onset More than a month ago   Aggravating Factors  Lifitng heavy things aggravates   Pain Relieving Factors chiropractor visit once per month; PT will monitor, but  will not address as a goal at this time.                         [facility] Adult PT Treatment/Exercise - 03/20/15 0917    Transfers   Transfers Sit to Stand;Stand to Sit   Sit to Stand 6: Modified independent (Device/Increase time);Without upper extremity assist;From chair/3-in-1   Stand to Sit 6: Modified independent (Device/Increase time);Without upper extremity assist;To chair/3-in-1   Number of Reps 10 reps;Other sets (comment)  each from 22 inch surface, then 18 inch surface no UE   Transfer Cueing Cues for improved forward lean and momentum to stand   Ambulation/Gait   Ambulation/Gait Yes   Ambulation/Gait Assistance 5: Supervision   Ambulation/Gait Assistance Details Pt takes small strides, decreased heelstrike and toe off, decreased arm swing and decr. trunk rotation.  Pt able to briefly improve step length with cues.   Ambulation Distance (Feet) 115 Feet  3 reps in 2 min 30 sec   Assistive device None   Gait Pattern Step-through pattern;Decreased arm swing - right;Decreased arm swing - left;Decreased step length - right;Decreased step length - left;Narrow base of support;Decreased trunk rotation   Ambulation Surface Level;Indoor           [facility]) - 03/20/15 0901    PWR! exercises Moves  in sitting   PWR! Up x 10   PWR! Rock x 10    PWR! Twist x 10  modified axial rotation trunk rotation   PWR! Step x 10   Comments Verbal and visual cues provided for technique and pacing of exercise; initiated as HEP       Self Care:  Answered patient's question regarding POC and skilled need for therapy.  Provided patient with insurance coordinator contact information for pt to follow-up.  Began discussion of walking program at home, with initial 2:30 of walking in clinic.  Pt has slight shortness of breath, relieved with cues for slow, pursed lip breathing.        PT Education - 03/20/15 [phone]    Education provided Yes   Education Details HEP initiated-PWR! Moves in  sitting-see instructions   Person(s) Educated Patient   Methods Explanation;Demonstration;Handout   Comprehension Verbalized understanding;Returned demonstration;Need further instruction;Verbal cues required          PT Short Term Goals - 03/10/15 1029    PT SHORT TERM GOAL #1   Title Pt will perform HEP independently, to address Parkinson's specific deficits, for improved functional mobility.  TARGET 04/06/15   Time 4   Period Weeks   Status New   PT SHORT TERM GOAL #2   Title Pt will perform sit<>stand transfers from 18 inches or below, at least 8 of 10 trials without UE support, for improved transfer efficiency, safety and functional strength.   Time 4   Period Weeks   Status New   PT SHORT TERM GOAL #3   Title Pt will improve TUG score to less than or equal to 13.5 seconds for improved turns with gait and decreased fall risk.   Time 4   Period Weeks   Status New   PT SHORT TERM GOAL #4   Title Pt will improve Functional Gait Assessment to at least 16/30 for decreased fall risk.   Time 4   Period Weeks   Status New   PT SHORT TERM GOAL #5   Title Pt will verbalize understanding of local Parkinson's disease resources.   Time 4   Period Weeks   Status New           PT Long Term Goals - 03/10/15 1032    PT LONG TERM GOAL #1   Title Pt will verbalize understanding of fall prevention within the home environment.  TARGET 05/06/15   Time 8   Period Weeks   Status New   PT LONG TERM GOAL #2   Title Pt will improve gait velocity to at least 3 ft/sec for improved safety and efficiency with gait.   Time 8   Period Weeks   Status New   PT LONG TERM GOAL #3   Title Pt will improve TUG manual score to less than or equal to 14.5 seconds for improved dual tasking with gait and decreased fall risk.   Time 8   Period Weeks   Status New   PT LONG TERM GOAL #4   Title Pt will improve Functional Gait Assessment to at least 20/30 for decreased fall risk.   Time 8   Period  Weeks   Status New   PT LONG TERM GOAL #5   Title Pt will verbalize understanding of optimal community and ongoing fitness activities upon D/C from PT.   Time 8   Period Weeks   Status New  Plan - 03/20/15 2231    Clinical Impression Statement Treatment time today focused on HEP initiating PWR! Moves in sitting for improved posture, trunk flexiibility and seated stepping.  Also focused on sit<>stand and began discussion of walking program for home.  Pt continues to take small, quick steps during gait, with the ability to slow gait pattern with increased stride length for approx. 60 ft with verbal cues.  Pt will continue to benefit from further skilled PT to address balance and gait deficits.   Pt will benefit from skilled therapeutic intervention in order to improve on the following deficits Abnormal gait;Decreased balance;Decreased mobility;Decreased strength;Decreased coordination;Difficulty walking;Postural dysfunction   Rehab Potential Good   PT Frequency 2x / week   PT Duration 8 weeks  plus eval   PT Treatment/Interventions ADLs/Self Care Home Management;Therapeutic exercise;Therapeutic activities;Functional mobility training;Gait training;Balance training;Neuromuscular re-education;Patient/family education   PT Next Visit Plan Review HEP for PWR! moves sitting; work on sit<>stand, PWR! stand and walking program; forward step/back step and weightshift to address improved step length   Consulted and Agree with Plan of Care Patient        Problem List Patient Active Problem List   Diagnosis Date Noted  . PCP NOTES >>>> 03/02/2015  . Atrial fibrillation with RVR ([place]) 01/31/2015  . Elevated troponin 01/31/2015  . Cough with sputum 01/31/2015  . COPD (chronic obstructive pulmonary disease) ([place]) 01/31/2015  . Lower abdominal pain 12/05/2014  . Acute bacterial bronchitis 10/04/2014  . Other fatigue 06/21/2014  . Lightheadedness 06/12/2014  . Concussion  11/21/2013  . Fall from other slipping, tripping, or stumbling 11/17/2013  . Concussion without loss of consciousness 11/17/2013  . COPD exacerbation ([place]) 09/26/2013  . Sinusitis, acute maxillary 07/19/2012  . Musculoskeletal pain 05/21/2012  . IBS (irritable bowel syndrome) 04/02/2012  . Nail fungus 03/01/2012  . Ear bleeding 12/10/2011  . Generalized anxiety disorder 07/10/2011  . Parkinson's disease ([place]) 02/03/2011  . Shoulder pain 02/03/2011  . Recurrent UTI 11/01/2010  . GERD (gastroesophageal reflux disease) 11/01/2010  . Memory loss 11/01/2010  . COPD, mild ([place]) 09/23/2010  . Hip pain, right 09/23/2010  . COMPRESSION FRACTURE, LUMBAR VERTEBRAE 03/28/2010  . Abdominal pain 03/27/2010  . LACERATION, SCALP 03/27/2010  . Atrial fibrillation ([place]) 03/22/2010  . DIZZINESS 03/22/2010  . Mycotic toenails 10/02/2009  . DIARRHEA-PRESUMED INFECTIOUS 04/02/2009  . DYSPNEA ON EXERTION 01/19/2009  . HEMORRHOIDS-INTERNAL 02/14/2008  . CONSTIPATION 02/14/2008  . PERSONAL HX COLONIC POLYPS 02/14/2008    Sorren Vallier W. 03/20/2015, 10:35 PM  Frazier Butt., PT  [facility] [address] [place], [place], [address] Phone: [phone]   Fax:  [phone]  Name: KEASIA DUBOSE MRN: [id number] Date of Birth: 12-19-1927

## 2015-03-20 NOTE — Patient Instructions (Signed)
Pt provided with PWR! Based handouts: -Seated PWR! Moves x 10-20 reps, once per day -PWR! Up, PWR! [place], [place]! Step   Provided axial rotation trunk rotation in sitting x 10-20 reps

## 2015-03-20 NOTE — Telephone Encounter (Signed)
[facility]  Call back number: [phone] [facility] [place] [place]  Reason for call:  As per pharmacy FLUoxetine (PROZAC) 20 MG tablet is expensive requesting 1 week of tablet and the rest of the medication in capsule form will save the patient $30. Please advise pharmacy directly

## 2015-03-20 NOTE — Telephone Encounter (Signed)
Spoke with Vita at [facility]. She states capsules are cheaper for pt but she is just starting medication and will be titrating up to '40mg'$  eventually. She is requesting rx of prozac '20mg'$  tablets, take 1 1/2 daily x 1 week #11 x no refills then prozac '20mg'$  2 capsules daily #60. Rxs re-sent. She states she has spoken with pt and pt understands how to titrate Rxs.

## 2015-03-22 ENCOUNTER — Ambulatory Visit (INDEPENDENT_AMBULATORY_CARE_PROVIDER_SITE_OTHER): Payer: Medicare Other | Admitting: Psychiatry

## 2015-03-22 ENCOUNTER — Ambulatory Visit: Payer: Medicare Other | Attending: Family Medicine | Admitting: Physical Therapy

## 2015-03-22 DIAGNOSIS — R293 Abnormal posture: Secondary | ICD-10-CM | POA: Insufficient documentation

## 2015-03-22 DIAGNOSIS — R269 Unspecified abnormalities of gait and mobility: Secondary | ICD-10-CM | POA: Insufficient documentation

## 2015-03-22 DIAGNOSIS — F419 Anxiety disorder, unspecified: Secondary | ICD-10-CM | POA: Diagnosis not present

## 2015-03-22 DIAGNOSIS — R2681 Unsteadiness on feet: Secondary | ICD-10-CM | POA: Insufficient documentation

## 2015-03-22 NOTE — Patient Instructions (Signed)
Weight Shift: Stepping Forward and Back    Hold counter for support.  Standing on left leg, step backward and transfer weight onto right leg, lifting toes of left foot. Then step forward and transfer weight onto right leg, lifting heel of left foot. Repeat 10 times each way. Then stand on right leg and move left leg.  Copyright  VHI. All rights reserved.     Walking Program:  Begin walking for exercise for 4 minutes, 3 times/day, 5 days/week.   Progress your walking program by adding 1 minute to your routine each week, as tolerated. Be sure to use your cane, wear good walking shoes, walk in a safe environment and only progress to your tolerance.     Exercise for stooped posture    Stand, back to wall with head, shoulders, buttocks and heels all touching wall. Hold position 60 seconds, then take two steps away from wall. Step back to wall and correct position if needed. Repeat 2 times. Do 2 sessions per day.  http://gt2.exer.us/688   Copyright  VHI. All rights reserved.

## 2015-03-22 NOTE — Therapy (Signed)
[facility] [address] [place], [place], [address] Phone: [phone]   Fax:  [phone]  Physical Therapy Treatment  Patient Details  Name: [name] MRN: [id number] Date of Birth: 08-Dec-1927 Referring Provider: Alonza Bogus, DO  Encounter Date: 03/22/2015      PT End of Session - 03/22/15 0857    Visit Number 3   Number of Visits 17   Date for PT Re-Evaluation 05/06/15   Authorization Type Medicare-Gcode every 10th visit; BCBS secondary   PT Start Time 0849   PT Stop Time 0933   PT Time Calculation (min) 44 min   Activity Tolerance Patient tolerated treatment well   Behavior During Therapy [facility] for tasks assessed/performed      Past Medical History  Diagnosis Date  . Arthritis   . Hyperlipidemia   . 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 Chronic constipation   . Hemorrhoid   . Varicose veins   . Anxiety   . Depression   . GERD (gastroesophageal reflux disease)   . Atrial fibrillation ([place])   . Recurrent UTI   . Parkinson disease ([place])   . Cardiac arrhythmia due to congenital heart disease   . Mycotic toenails 10/27/2012  . COPD (chronic obstructive pulmonary disease) ([place])   . Parkinsonism [facility])     Past Surgical History  Procedure Laterality Date  . Appendectomy  81 years old  . Colon resection  2008    There were no vitals filed for this visit.  Visit Diagnosis:  Abnormality of gait      Subjective Assessment - 03/22/15 0851    Subjective Pt reports she is anxious to get back to ACT and increase endurance.     Patient Stated Goals Pt's goal for therapy is to have better balance.   Currently in Pain? No/denies                         [facility] Adult PT Treatment/Exercise - 03/22/15 0001    Transfers   Transfers Sit to Stand;Stand to Sit   Sit to Stand 6: Modified independent (Device/Increase time);Without upper extremity assist;From chair/3-in-1   Stand to Sit  6: Modified independent (Device/Increase time);Without upper extremity assist;To chair/3-in-1   Ambulation/Gait   Ambulation/Gait Yes   Ambulation/Gait Assistance 5: Supervision   Ambulation Distance (Feet) 1000 Feet  plus   Assistive device Straight cane   Gait Pattern Step-through pattern;Decreased arm swing - right;Decreased arm swing - left;Decreased step length - right;Decreased step length - left;Narrow base of support;Decreased trunk rotation   Ambulation Surface Level;Indoor   Gait Comments gait working on sequencing with cane and increasing bil step length and cadence.  Pt tends to take 3-4 small steps for every 1 time that she advances the cane.  Step length improved when cane advanced equally with L LE   Posture/Postural Control   Posture/Postural Control Postural limitations   Postural Limitations Rounded Shoulders;Forward head;Flexed trunk   Posture Comments standing at wall posture exercise x 60 seconds-provided as HEP   High Level Balance   High Level Balance Activities Other (comment)   High Level Balance Comments forward, backward and forward<>backward step weight shifting at counter for UE support with cues for technique and intensity-provided as HEP           PWR [facility]) - 03/22/15 1206    PWR! exercises Moves in sitting   PWR! Up 10   PWR! Rock 10  PWR! Twist 10  modified axial trunk rotation edge of mat   PWR! Step 10   Comments In sitting-verbal and visual cues             PT Education - 03/22/15 1212    Education provided Yes   Education Details weight shifting at counter, proper sequencing with SPC, standing wall stretch for posture, establishing walking program and parameters   Person(s) Educated Patient   Methods Explanation;Demonstration;Verbal cues;Tactile cues;Handout   Comprehension Verbalized understanding;Returned demonstration;Need further instruction          PT Short Term Goals - 03/10/15 1029    PT SHORT TERM GOAL #1   Title Pt  will perform HEP independently, to address Parkinson's specific deficits, for improved functional mobility.  TARGET 04/06/15   Time 4   Period Weeks   Status New   PT SHORT TERM GOAL #2   Title Pt will perform sit<>stand transfers from 18 inches or below, at least 8 of 10 trials without UE support, for improved transfer efficiency, safety and functional strength.   Time 4   Period Weeks   Status New   PT SHORT TERM GOAL #3   Title Pt will improve TUG score to less than or equal to 13.5 seconds for improved turns with gait and decreased fall risk.   Time 4   Period Weeks   Status New   PT SHORT TERM GOAL #4   Title Pt will improve Functional Gait Assessment to at least 16/30 for decreased fall risk.   Time 4   Period Weeks   Status New   PT SHORT TERM GOAL #5   Title Pt will verbalize understanding of local Parkinson's disease resources.   Time 4   Period Weeks   Status New           PT Long Term Goals - 03/10/15 1032    PT LONG TERM GOAL #1   Title Pt will verbalize understanding of fall prevention within the home environment.  TARGET 05/06/15   Time 8   Period Weeks   Status New   PT LONG TERM GOAL #2   Title Pt will improve gait velocity to at least 3 ft/sec for improved safety and efficiency with gait.   Time 8   Period Weeks   Status New   PT LONG TERM GOAL #3   Title Pt will improve TUG manual score to less than or equal to 14.5 seconds for improved dual tasking with gait and decreased fall risk.   Time 8   Period Weeks   Status New   PT LONG TERM GOAL #4   Title Pt will improve Functional Gait Assessment to at least 20/30 for decreased fall risk.   Time 8   Period Weeks   Status New   PT LONG TERM GOAL #5   Title Pt will verbalize understanding of optimal community and ongoing fitness activities upon D/C from PT.   Time 8   Period Weeks   Status New               Plan - 03/22/15 1213    Clinical Impression Statement Pt responded well to cues  with exercises and gait.  Able to tolerated increased ambulation distance today as well.  Step length improved with proper cane sequencing.  Continue PT per POC.   Pt will benefit from skilled therapeutic intervention in order to improve on the following deficits Abnormal gait;Decreased balance;Decreased mobility;Decreased strength;Decreased coordination;Difficulty walking;Postural dysfunction  Rehab Potential Good   PT Frequency 2x / week   PT Duration 8 weeks  plus eval   PT Treatment/Interventions ADLs/Self Care Home Management;Therapeutic exercise;Therapeutic activities;Functional mobility training;Gait training;Balance training;Neuromuscular re-education;Patient/family education   PT Next Visit Plan Review weight shifting HEP and wall stretch for posture;continue gait with cane and proper sequence   Consulted and Agree with Plan of Care Patient        Problem List Patient Active Problem List   Diagnosis Date Noted  . PCP NOTES >>>> 03/02/2015  . Atrial fibrillation with RVR ([place]) 01/31/2015  . Elevated troponin 01/31/2015  . Cough with sputum 01/31/2015  . COPD (chronic obstructive pulmonary disease) ([place]) 01/31/2015  . Lower abdominal pain 12/05/2014  . Acute bacterial bronchitis 10/04/2014  . Other fatigue 06/21/2014  . Lightheadedness 06/12/2014  . Concussion 11/21/2013  . Fall from other slipping, tripping, or stumbling 11/17/2013  . Concussion without loss of consciousness 11/17/2013  . COPD exacerbation ([place]) 09/26/2013  . Sinusitis, acute maxillary 07/19/2012  . Musculoskeletal pain 05/21/2012  . IBS (irritable bowel syndrome) 04/02/2012  . Nail fungus 03/01/2012  . Ear bleeding 12/10/2011  . Generalized anxiety disorder 07/10/2011  . Parkinson's disease ([place]) 02/03/2011  . Shoulder pain 02/03/2011  . Recurrent UTI 11/01/2010  . GERD (gastroesophageal reflux disease) 11/01/2010  . Memory loss 11/01/2010  . COPD, mild ([place]) 09/23/2010  . Hip pain, right  09/23/2010  . COMPRESSION FRACTURE, LUMBAR VERTEBRAE 03/28/2010  . Abdominal pain 03/27/2010  . LACERATION, SCALP 03/27/2010  . Atrial fibrillation ([place]) 03/22/2010  . DIZZINESS 03/22/2010  . Mycotic toenails 10/02/2009  . DIARRHEA-PRESUMED INFECTIOUS 04/02/2009  . DYSPNEA ON EXERTION 01/19/2009  . HEMORRHOIDS-INTERNAL 02/14/2008  . CONSTIPATION 02/14/2008  . PERSONAL HX COLONIC POLYPS 02/14/2008    Narda Bonds 03/22/2015, 12:16 PM  [facility] [address] [place], [place], [address] Phone: [phone]   Fax:  [phone]  Name: [name] MRN: [id number] Date of Birth: 1927-09-12    Narda Bonds, [place] 03/22/2015 12:16 PM Phone: [phone] Fax: [phone]

## 2015-03-28 ENCOUNTER — Ambulatory Visit: Payer: Medicare Other | Admitting: Physical Therapy

## 2015-03-28 DIAGNOSIS — R2681 Unsteadiness on feet: Secondary | ICD-10-CM | POA: Diagnosis not present

## 2015-03-28 DIAGNOSIS — R269 Unspecified abnormalities of gait and mobility: Secondary | ICD-10-CM | POA: Diagnosis not present

## 2015-03-28 DIAGNOSIS — R293 Abnormal posture: Secondary | ICD-10-CM | POA: Diagnosis not present

## 2015-03-28 NOTE — Therapy (Signed)
[facility] [address] [place], [place], [address] Phone: [phone]   Fax:  [phone]  Physical Therapy Treatment  Patient Details  Name: [name] MRN: [id number] Date of Birth: [birth year] Referring Provider: Alonza Bogus, DO  Encounter Date: 03/28/2015      PT End of Session - 03/28/15 0910    Visit Number 4   Number of Visits 17   Date for PT Re-Evaluation 05/06/15   Authorization Type Medicare-Gcode every 10th visit; BCBS secondary   PT Start Time 0846   PT Stop Time 0936   PT Time Calculation (min) 50 min   Activity Tolerance Patient tolerated treatment well   Behavior During Therapy [facility] for tasks assessed/performed      Past Medical History  Diagnosis Date  . Arthritis   . Hyperlipidemia   . 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 Chronic constipation   . Hemorrhoid   . Varicose veins   . Anxiety   . Depression   . GERD (gastroesophageal reflux disease)   . Atrial fibrillation ([place])   . Recurrent UTI   . Parkinson disease ([place])   . Cardiac arrhythmia due to congenital heart disease   . Mycotic toenails 10/27/2012  . COPD (chronic obstructive pulmonary disease) ([place])   . Parkinsonism [facility])     Past Surgical History  Procedure Laterality Date  . Appendectomy  81 years old  . Colon resection  2008    There were no vitals filed for this visit.  Visit Diagnosis:  Abnormality of gait      Subjective Assessment - 03/28/15 0853    Subjective Pt reports she called our insurance person but has not heard back from her.  Is worried that the sessions won't be paid for.  [place] a lot of walking [place] the [place] this [place].   Patient Stated Goals Pt's goal for therapy is to have better balance.   Currently in Pain? Yes   Pain Score 4    Pain Location Back   Pain Orientation Right;Lower   Pain Descriptors / Indicators Aching   Pain Type Chronic pain   Pain Onset More than a month ago   Aggravating Factors  picking something up   Pain Relieving Factors chiropractor visit once a month   Effect of Pain on Daily Activities "I dont like to think about it"      Performed standing wall stretch x 60 seconds x 2 Standing at counter with intermittent UE support for forward step, backward step and forward to backward step weight shifts as well as staggered stance weight shifts Standing PWR! At counter x 20 reps with verbal and visual cues for technique-provided as HEP Gait with cane working on sequencing and posture on level ground x 500' x 1 and 100' x 4 Instructed pt in seated hamstring stretch x 30 seconds x 2 bil LE 's with foot on stool.  Provided as HEP.       PT Education - 03/28/15 1305    Education provided Yes   Education Details HEP   Person(s) Educated Patient   Methods Explanation;Demonstration;Handout;Verbal cues   Comprehension Verbalized understanding;Returned demonstration          PT Short Term Goals - 03/10/15 1029    PT SHORT TERM GOAL #1   Title Pt will perform HEP independently, to address Parkinson's specific deficits, for improved functional mobility.  TARGET 04/06/15   Time 4   Period Weeks   Status New  PT SHORT TERM GOAL #2   Title Pt will perform sit<>stand transfers from 18 inches or below, at least 8 of 10 trials without UE support, for improved transfer efficiency, safety and functional strength.   Time 4   Period Weeks   Status New   PT SHORT TERM GOAL #3   Title Pt will improve TUG score to less than or equal to 13.5 seconds for improved turns with gait and decreased fall risk.   Time 4   Period Weeks   Status New   PT SHORT TERM GOAL #4   Title Pt will improve Functional Gait Assessment to at least 16/30 for decreased fall risk.   Time 4   Period Weeks   Status New   PT SHORT TERM GOAL #5   Title Pt will verbalize understanding of local Parkinson's disease resources.   Time 4   Period Weeks   Status New           PT  Long Term Goals - 03/10/15 1032    PT LONG TERM GOAL #1   Title Pt will verbalize understanding of fall prevention within the home environment.  TARGET 05/06/15   Time 8   Period Weeks   Status New   PT LONG TERM GOAL #2   Title Pt will improve gait velocity to at least 3 ft/sec for improved safety and efficiency with gait.   Time 8   Period Weeks   Status New   PT LONG TERM GOAL #3   Title Pt will improve TUG manual score to less than or equal to 14.5 seconds for improved dual tasking with gait and decreased fall risk.   Time 8   Period Weeks   Status New   PT LONG TERM GOAL #4   Title Pt will improve Functional Gait Assessment to at least 20/30 for decreased fall risk.   Time 8   Period Weeks   Status New   PT LONG TERM GOAL #5   Title Pt will verbalize understanding of optimal community and ongoing fitness activities upon D/C from PT.   Time 8   Period Weeks   Status New               Plan - 03/28/15 1306    Clinical Impression Statement Pt with improved endurance during todays session and only needed one rest break.  Motivated to increase mobility and endurance.  Continue PT per POC.   Pt will benefit from skilled therapeutic intervention in order to improve on the following deficits Abnormal gait;Decreased balance;Decreased mobility;Decreased strength;Decreased coordination;Difficulty walking;Postural dysfunction   Rehab Potential Good   PT Frequency 2x / week   PT Duration 8 weeks  plus eval   PT Treatment/Interventions ADLs/Self Care Home Management;Therapeutic exercise;Therapeutic activities;Functional mobility training;Gait training;Balance training;Neuromuscular re-education;Patient/family education   PT Next Visit Plan treadmill and Scifit for endurance   Consulted and Agree with Plan of Care Patient        Problem List Patient Active Problem List   Diagnosis Date Noted  . PCP NOTES >>>> 03/02/2015  . Atrial fibrillation with RVR ([place]) 01/31/2015  .  Elevated troponin 01/31/2015  . Cough with sputum 01/31/2015  . COPD (chronic obstructive pulmonary disease) ([place]) 01/31/2015  . Lower abdominal pain 12/05/2014  . Acute bacterial bronchitis 10/04/2014  . Other fatigue 06/21/2014  . Lightheadedness 06/12/2014  . Concussion 11/21/2013  . Fall from other slipping, tripping, or stumbling 11/17/2013  . Concussion without loss of consciousness 11/17/2013  .  COPD exacerbation ([place]) 09/26/2013  . Sinusitis, acute maxillary 07/19/2012  . Musculoskeletal pain 05/21/2012  . IBS (irritable bowel syndrome) 04/02/2012  . Nail fungus 03/01/2012  . Ear bleeding 12/10/2011  . Generalized anxiety disorder 07/10/2011  . Parkinson's disease ([place]) 02/03/2011  . Shoulder pain 02/03/2011  . Recurrent UTI 11/01/2010  . GERD (gastroesophageal reflux disease) 11/01/2010  . Memory loss 11/01/2010  . COPD, mild ([place]) 09/23/2010  . Hip pain, right 09/23/2010  . COMPRESSION FRACTURE, LUMBAR VERTEBRAE 03/28/2010  . Abdominal pain 03/27/2010  . LACERATION, SCALP 03/27/2010  . Atrial fibrillation ([place]) 03/22/2010  . DIZZINESS 03/22/2010  . Mycotic toenails 10/02/2009  . DIARRHEA-PRESUMED INFECTIOUS 04/02/2009  . DYSPNEA ON EXERTION 01/19/2009  . HEMORRHOIDS-INTERNAL 02/14/2008  . CONSTIPATION 02/14/2008  . PERSONAL HX COLONIC POLYPS 02/14/2008    Narda Bonds 03/28/2015, 1:08 PM  [facility] [address] [place], [place], [address] Phone: [phone]   Fax:  [phone]  Name: [name] MRN: [id number] Date of Birth: June 24, 1927  Narda Bonds, [place] 03/28/2015 1:08 PM Phone: [phone] Fax: [phone]

## 2015-03-28 NOTE — Patient Instructions (Addendum)
Leg Extension (Hamstring)    Sit toward front edge of chair, with leg out straight, heel on floor, toes pointing toward body. Keeping back straight, bend forward at hip.  Hold this position for 30 seconds.  Make sure to keep knee straight and do a gentle stretch. Repeat 3 times. Repeat with other leg. Do 3 sessions per day. Variation: Perform from standing position, with support.  Copyright  VHI. All rights reserved.   Provided standing PWR! Handout for HEP Basic 4 moves x 20 reps once a day

## 2015-03-30 ENCOUNTER — Ambulatory Visit: Payer: Self-pay | Admitting: Physical Therapy

## 2015-04-04 ENCOUNTER — Ambulatory Visit: Payer: Medicare Other | Admitting: Physical Therapy

## 2015-04-04 DIAGNOSIS — R2681 Unsteadiness on feet: Secondary | ICD-10-CM

## 2015-04-04 DIAGNOSIS — R293 Abnormal posture: Secondary | ICD-10-CM | POA: Diagnosis not present

## 2015-04-04 DIAGNOSIS — R269 Unspecified abnormalities of gait and mobility: Secondary | ICD-10-CM | POA: Diagnosis not present

## 2015-04-04 NOTE — Therapy (Signed)
[facility] [address] [place], [place], [address] Phone: [phone]   Fax:  [phone]  Physical Therapy Treatment  Patient Details  Name: [name] MRN: [id number] Date of Birth: 12-22-1927 Referring Provider: Alonza Bogus, DO  Encounter Date: 04/04/2015      PT End of Session - 04/04/15 2247    Visit Number 5   Number of Visits 17   Date for PT Re-Evaluation 05/06/15   Authorization Type Medicare-Gcode every 10th visit; BCBS secondary   PT Start Time 1020   PT Stop Time 1100   PT Time Calculation (min) 40 min   Activity Tolerance Patient tolerated treatment well   Behavior During Therapy [facility] for tasks assessed/performed      Past Medical History  Diagnosis Date  . Arthritis   . Hyperlipidemia   . 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 Chronic constipation   . Hemorrhoid   . Varicose veins   . Anxiety   . Depression   . GERD (gastroesophageal reflux disease)   . Atrial fibrillation ([place])   . Recurrent UTI   . Parkinson disease ([place])   . Cardiac arrhythmia due to congenital heart disease   . Mycotic toenails 10/27/2012  . COPD (chronic obstructive pulmonary disease) ([place])   . Parkinsonism [facility])     Past Surgical History  Procedure Laterality Date  . Appendectomy  81 years old  . Colon resection  2008    There were no vitals filed for this visit.  Visit Diagnosis:  Unsteadiness  Postural instability  Abnormality of gait      Subjective Assessment - 04/04/15 1022    Subjective Feel a lot better with the exercises that I've  been doing.  Still trying to coordinate cane with L leg during walking.   Currently in Pain? Yes   Pain Score 2    Pain Location Back   Pain Orientation Lower   Pain Descriptors / Indicators Aching   Pain Type Chronic pain   Pain Onset More than a month ago   Pain Frequency Constant   Aggravating Factors  picking up hevay things   Pain Relieving  Factors back exercises alleviate                         [facility] Adult PT Treatment/Exercise - 04/04/15 1051    Ambulation/Gait   Ambulation/Gait Yes   Ambulation/Gait Assistance 5: Supervision   Ambulation/Gait Assistance Details Cues provided to slow gait pace, for even, consistent steps, in coordination with cane.   Ambulation Distance (Feet) 500 Feet  then 120 ft x 2, with cane.   Assistive device Straight cane   Gait Pattern Step-through pattern;Decreased arm swing - left;Decreased step length - right;Decreased step length - left;Narrow base of support  improves with slowed pace, cued gait   Ambulation Surface Level;Indoor   Pre-Gait Activities Reviewd step and weightshift exercise at counter in forward/back direction, 10 reps each side.  Pt reports not performing correctly at home, she improves with cues.  Also practiced step and weigthshift with counter and cane assistance,x 10 reps to assist with cane sequence and pacing.   Gait Comments Pt's gait pattern improves with cues for slowed pacing with gait. After about 100 ft, pt does tend to speed up gait pattern, taking smaller steps.   High Level Balance   High Level Balance Activities Side stepping;Backward walking;Marching forwards;Marching backwards  Forward/back walking in bars; focus on incr. step  length/SLS   High Level Balance Comments Progressed to performing at counter with 1 UE support; standing with LUE counter support and cane in RUE-R step forward/back, with focus on increased R step length, with close supervision.  Short distance gait activities in gym with slowed pace, with improved consistent step length.   Exercises   Exercises --  Reviewed seated hamstring stretch-pt demo understanding    Therapeutic Exercise:In addition to reviewing hamstring stretches, reviewed postural strengthening and awareness activity at wall for improved posture.  Discussed awareness of posture and use of this exercise throughout  the day for improved posture.       [facility]) - 04/04/15 1035    PWR! exercises Moves in standing   PWR! Up x 10   PWR! Rock x 10   PWR! Twist x 10  cues for intensity and technique   PWR Step x 10   Comments Review of HEP- pt perfomrs with minimal cues for proper technique   PWR! Up 10   PWR! Rock 10   PWR! Twist 10  modified axial rotation sitting at edge of mat   PWR! Step x 10   Comments Pt performs seated exercises independently with correct technique.     PWR! Moves above performed in sitting and standing.          PT Short Term Goals - 04/04/15 1045    PT SHORT TERM GOAL #1   Title Pt will perform HEP independently, to address Parkinson's specific deficits, for improved functional mobility.  TARGET 04/06/15   Time 4   Period Weeks   Status Partially Met   PT SHORT TERM GOAL #2   Title Pt will perform sit<>stand transfers from 18 inches or below, at least 8 of 10 trials without UE support, for improved transfer efficiency, safety and functional strength.   Time 4   Period Weeks   Status New   PT SHORT TERM GOAL #3   Title Pt will improve TUG score to less than or equal to 13.5 seconds for improved turns with gait and decreased fall risk.   Time 4   Period Weeks   Status New   PT SHORT TERM GOAL #4   Title Pt will improve Functional Gait Assessment to at least 16/30 for decreased fall risk.   Time 4   Period Weeks   Status New   PT SHORT TERM GOAL #5   Title Pt will verbalize understanding of local Parkinson's disease resources.   Time 4   Period Weeks   Status New           PT Long Term Goals - 04/04/15 1045    PT LONG TERM GOAL #1   Title Pt will verbalize understanding of fall prevention within the home environment.  TARGET 05/06/15   Time 8   Period Weeks   Status New   PT LONG TERM GOAL #2   Title Pt will improve gait velocity to at least 3 ft/sec for improved safety and efficiency with gait.   Time 8   Period Weeks   Status New   PT  LONG TERM GOAL #3   Title Pt will improve TUG manual score to less than or equal to 14.5 seconds for improved dual tasking with gait and decreased fall risk.   Time 8   Period Weeks   Status New   PT LONG TERM GOAL #4   Title Pt will improve Functional Gait Assessment to at least 20/30 for decreased fall  risk.   Time 8   Period Weeks   Status New   PT LONG TERM GOAL #5   Title Pt will verbalize understanding of optimal community and ongoing fitness activities upon D/C from PT.   Time 8   Period Weeks   Status New               Plan - 04/04/15 2248    Clinical Impression Statement Pt has partially met STG #1 for HEP.  Pt is performing most exercises in HEP independently, but does need minimal cueing for proper technique of exercises.  Pt able to improve sequencing gait with cane, by improved attention to step length and foot clearance, but reverts back to smaller step length and faster steps after about 100 ft.  Pt feels she is making steady progress with PT.  Pt will continue to benefit from further skilled PT to address strength, balance and gait.   Pt will benefit from skilled therapeutic intervention in order to improve on the following deficits Abnormal gait;Decreased balance;Decreased mobility;Decreased strength;Decreased coordination;Difficulty walking;Postural dysfunction   Rehab Potential Good   PT Frequency 2x / week   PT Duration 8 weeks  plus eval   PT Treatment/Interventions ADLs/Self Care Home Management;Therapeutic exercise;Therapeutic activities;Functional mobility training;Gait training;Balance training;Neuromuscular re-education;Patient/family education   PT Next Visit Plan Begin checking short term goals; continue gait and balance activities,    Consulted and Agree with Plan of Care Patient        Problem List Patient Active Problem List   Diagnosis Date Noted  . PCP NOTES >>>> 03/02/2015  . Atrial fibrillation with RVR ([place]) 01/31/2015  . Elevated  troponin 01/31/2015  . Cough with sputum 01/31/2015  . COPD (chronic obstructive pulmonary disease) ([place]) 01/31/2015  . Lower abdominal pain 12/05/2014  . Acute bacterial bronchitis 10/04/2014  . Other fatigue 06/21/2014  . Lightheadedness 06/12/2014  . Concussion 11/21/2013  . Fall from other slipping, tripping, or stumbling 11/17/2013  . Concussion without loss of consciousness 11/17/2013  . COPD exacerbation ([place]) 09/26/2013  . Sinusitis, acute maxillary 07/19/2012  . Musculoskeletal pain 05/21/2012  . IBS (irritable bowel syndrome) 04/02/2012  . Nail fungus 03/01/2012  . Ear bleeding 12/10/2011  . Generalized anxiety disorder 07/10/2011  . Parkinson's disease ([place]) 02/03/2011  . Shoulder pain 02/03/2011  . Recurrent UTI 11/01/2010  . GERD (gastroesophageal reflux disease) 11/01/2010  . Memory loss 11/01/2010  . COPD, mild ([place]) 09/23/2010  . Hip pain, right 09/23/2010  . COMPRESSION FRACTURE, LUMBAR VERTEBRAE 03/28/2010  . Abdominal pain 03/27/2010  . LACERATION, SCALP 03/27/2010  . Atrial fibrillation ([place]) 03/22/2010  . DIZZINESS 03/22/2010  . Mycotic toenails 10/02/2009  . DIARRHEA-PRESUMED INFECTIOUS 04/02/2009  . DYSPNEA ON EXERTION 01/19/2009  . HEMORRHOIDS-INTERNAL 02/14/2008  . CONSTIPATION 02/14/2008  . PERSONAL HX COLONIC POLYPS 02/14/2008    Mikaelyn Arthurs W. 04/04/2015, 10:51 PM  Frazier Butt., PT  [facility] [address] [place], [place], [address] Phone: [phone]   Fax:  [phone]  Name: [name] MRN: [id number] Date of Birth: [birth year]

## 2015-04-05 ENCOUNTER — Ambulatory Visit (INDEPENDENT_AMBULATORY_CARE_PROVIDER_SITE_OTHER): Payer: Medicare Other | Admitting: Psychiatry

## 2015-04-05 DIAGNOSIS — F419 Anxiety disorder, unspecified: Secondary | ICD-10-CM

## 2015-04-06 ENCOUNTER — Encounter: Payer: Self-pay | Admitting: Internal Medicine

## 2015-04-06 ENCOUNTER — Ambulatory Visit: Payer: Medicare Other | Admitting: Physical Therapy

## 2015-04-06 ENCOUNTER — Encounter: Payer: Self-pay | Admitting: Family Medicine

## 2015-04-06 DIAGNOSIS — R293 Abnormal posture: Secondary | ICD-10-CM

## 2015-04-06 DIAGNOSIS — R2681 Unsteadiness on feet: Secondary | ICD-10-CM | POA: Diagnosis not present

## 2015-04-06 DIAGNOSIS — R269 Unspecified abnormalities of gait and mobility: Secondary | ICD-10-CM | POA: Diagnosis not present

## 2015-04-06 NOTE — Patient Instructions (Signed)
For improved turns:  1)If you have a good amount of space, walk around like a U-turn for a wide walking turn  2)If you are in a small space, march in place to shift your weight and change directions to turn  3)If you are stopped in a place (like working at a counter), and then need to change directions to start turning, you can do a "clock" turn, or a quarter turn, almost like stepping to the numbers of a clock until you are facing the direction you want to start walking

## 2015-04-06 NOTE — Therapy (Signed)
[facility] [address] [place], [place], [address] Phone: [phone]   Fax:  [phone]  Physical Therapy Treatment  Patient Details  Name: [name] MRN: [id number] Date of Birth: 1928-03-28 Referring Provider: Alonza Bogus, DO  Encounter Date: 04/06/2015      PT End of Session - 04/06/15 1015    Visit Number 6   Number of Visits 17   Date for PT Re-Evaluation 05/06/15   Authorization Type Medicare-Gcode every 10th visit; BCBS secondary   PT Start Time 0848   PT Stop Time 0930   PT Time Calculation (min) 42 min   Activity Tolerance Patient tolerated treatment well   Behavior During Therapy [facility] for tasks assessed/performed      Past Medical History  Diagnosis Date  . Arthritis   . Hyperlipidemia   . 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 Chronic constipation   . Hemorrhoid   . Varicose veins   . Anxiety   . Depression   . GERD (gastroesophageal reflux disease)   . Atrial fibrillation ([place])   . Recurrent UTI   . Parkinson disease ([place])   . Cardiac arrhythmia due to congenital heart disease   . Mycotic toenails 10/27/2012  . COPD (chronic obstructive pulmonary disease) ([place])   . Parkinsonism [facility])     Past Surgical History  Procedure Laterality Date  . Appendectomy  81 years old  . Colon resection  2008    There were no vitals filed for this visit.  Visit Diagnosis:  Postural instability  Abnormality of gait      Subjective Assessment - 04/06/15 0851    Subjective Feel a lot better with doing exercises.   Currently in Pain? Yes   Pain Score 2    Pain Location Back   Pain Orientation Lower   Pain Descriptors / Indicators Aching   Pain Type Chronic pain   Aggravating Factors  picking up heavy things   Pain Relieving Factors back exercises alleviate            [facility] PT Assessment - 04/06/15 0903    Functional Gait  Assessment   Gait Level Surface Walks 20 ft, slow  speed, abnormal gait pattern, evidence for imbalance or deviates 10-15 in outside of the 12 in walkway width. Requires more than 7 sec to ambulate 20 ft.  7.78 sec   Change in Gait Speed Able to change speed, demonstrates mild gait deviations, deviates 6-10 in outside of the 12 in walkway width, or no gait deviations, unable to achieve a major change in velocity, or uses a change in velocity, or uses an assistive device.   Gait with Horizontal Head Turns Performs head turns smoothly with slight change in gait velocity (eg, minor disruption to smooth gait path), deviates 6-10 in outside 12 in walkway width, or uses an assistive device.   Gait with Vertical Head Turns Performs task with moderate change in gait velocity, slows down, deviates 10-15 in outside 12 in walkway width but recovers, can continue to walk.   Gait and Pivot Turn Pivot turns safely in greater than 3 sec and stops with no loss of balance, or pivot turns safely within 3 sec and stops with mild imbalance, requires small steps to catch balance.   Step Over Obstacle Is able to step over one shoe box (4.5 in total height) but must slow down and adjust steps to clear box safely. May require verbal cueing.   Gait with Narrow Base  of Support Ambulates less than 4 steps heel to toe or cannot perform without assistance.   Gait with Eyes Closed Walks 20 ft, slow speed, abnormal gait pattern, evidence for imbalance, deviates 10-15 in outside 12 in walkway width. Requires more than 9 sec to ambulate 20 ft.  14.85 sec   Ambulating Backwards Walks 20 ft, slow speed, abnormal gait pattern, evidence for imbalance, deviates 10-15 in outside 12 in walkway width.  25.58 sec in 20 ft.   Steps Alternating feet, must use rail.   Total Score 13   FGA comment: Scores <15/30 are indicative of increased fall risk.                     [facility] Adult PT Treatment/Exercise - 04/06/15 0903    Transfers   Transfers Sit to Stand;Stand to Sit   Sit to  Stand 6: Modified independent (Device/Increase time);Without upper extremity assist;From chair/3-in-1  from 18" and 16" surfaces   Stand to Sit 6: Modified independent (Device/Increase time);Without upper extremity assist;To chair/3-in-1   Number of Reps 10 reps  each from 18", 16" surfaces   Ambulation/Gait   Ambulation/Gait Yes   Ambulation/Gait Assistance 5: Supervision   Ambulation Distance (Feet) 120 Feet  200, then 120 x 2   Assistive device Straight cane   Gait Pattern Step-through pattern;Decreased arm swing - left;Decreased step length - right;Decreased step length - left;Narrow base of support   Ambulation Surface Level;Indoor   Gait Comments Pt's gait pattern improves with cues for slowed pacing with gait. After about 100 ft, pt does tend to speed up gait pattern, taking smaller steps.  Therapist provides verbal cues and supervision   Standardized Balance Assessment   Standardized Balance Assessment Timed Up and Go Test   Timed Up and Go Test   TUG Normal TUG   Normal TUG (seconds) 14.09   Exercises   Exercises Knee/Hip   Knee/Hip Exercises: Aerobic   Stepper SciFit, Seated Stepper, Level 1.5, 4 extremities x 8 minutes with cues for intensity >60-70 RPM        Pt instructed in gait practice with turning methods-see instructions-practiced varied turning methods with gait in gym area with cane and superivsion.        PT Education - 04/06/15 1013    Education provided Yes   Education Details POC discussion, goal check progress, and turning practice   Person(s) Educated Patient   Methods Explanation;Demonstration;Handout   Comprehension Verbalized understanding;Returned demonstration;Verbal cues required          PT Short Term Goals - 04/06/15 0900    PT SHORT TERM GOAL #1   Title Pt will perform HEP independently, to address Parkinson's specific deficits, for improved functional mobility.  TARGET 04/06/15   Time 4   Period Weeks   Status Partially Met   PT  SHORT TERM GOAL #2   Title Pt will perform sit<>stand transfers from 18 inches or below, at least 8 of 10 trials without UE support, for improved transfer efficiency, safety and functional strength.   Time 4   Period Weeks   Status Achieved   PT SHORT TERM GOAL #3   Title Pt will improve TUG score to less than or equal to 13.5 seconds for improved turns with gait and decreased fall risk.   Baseline 14.07 sec (improved from 14.77 sec)-04/06/15   Time 4   Period Weeks   Status Not Met   PT SHORT TERM GOAL #4   Title Pt will  improve Functional Gait Assessment to at least 16/30 for decreased fall risk.   Time 4   Period Weeks   Status Not Met   PT SHORT TERM GOAL #5   Title Pt will verbalize understanding of local Parkinson's disease resources.   Time 4   Period Weeks   Status On-going           PT Long Term Goals - 04/04/15 1045    PT LONG TERM GOAL #1   Title Pt will verbalize understanding of fall prevention within the home environment.  TARGET 05/06/15   Time 8   Period Weeks   Status New   PT LONG TERM GOAL #2   Title Pt will improve gait velocity to at least 3 ft/sec for improved safety and efficiency with gait.   Time 8   Period Weeks   Status New   PT LONG TERM GOAL #3   Title Pt will improve TUG manual score to less than or equal to 14.5 seconds for improved dual tasking with gait and decreased fall risk.   Time 8   Period Weeks   Status New   PT LONG TERM GOAL #4   Title Pt will improve Functional Gait Assessment to at least 20/30 for decreased fall risk.   Time 8   Period Weeks   Status New   PT LONG TERM GOAL #5   Title Pt will verbalize understanding of optimal community and ongoing fitness activities upon D/C from PT.   Time 8   Period Weeks   Status New               Plan - 04/06/15 1225    Clinical Impression Statement Pt has met STG #2 for transfers.  STG #3 and 4 not met; STG #5 ongoing.  Pt is feeling stronger and is demonstrating improved  efficiency with gait in varied measures; however, she appears safer to use cane with gait.  Discussed and practiced methods for safe turning with gait today.  Pt feels she will be ready for discharge after scheduled visits in January.   Pt will benefit from skilled therapeutic intervention in order to improve on the following deficits Abnormal gait;Decreased balance;Decreased mobility;Decreased strength;Decreased coordination;Difficulty walking;Postural dysfunction   Rehab Potential Good   PT Frequency 2x / week   PT Duration 8 weeks  plus eval   PT Treatment/Interventions ADLs/Self Care Home Management;Therapeutic exercise;Therapeutic activities;Functional mobility training;Gait training;Balance training;Neuromuscular re-education;Patient/family education   PT Next Visit Plan Dynamic Balance activities at counter-try to add to HEP; review turning activities; help pt prepare for transition back to [facility] early-mid January   Consulted and Agree with Plan of Care Patient        Problem List Patient Active Problem List   Diagnosis Date Noted  . PCP NOTES >>>> 03/02/2015  . Atrial fibrillation with RVR ([place]) 01/31/2015  . Elevated troponin 01/31/2015  . Cough with sputum 01/31/2015  . COPD (chronic obstructive pulmonary disease) ([place]) 01/31/2015  . Lower abdominal pain 12/05/2014  . Acute bacterial bronchitis 10/04/2014  . Other fatigue 06/21/2014  . Lightheadedness 06/12/2014  . Concussion 11/21/2013  . Fall from other slipping, tripping, or stumbling 11/17/2013  . Concussion without loss of consciousness 11/17/2013  . COPD exacerbation ([place]) 09/26/2013  . Sinusitis, acute maxillary 07/19/2012  . Musculoskeletal pain 05/21/2012  . IBS (irritable bowel syndrome) 04/02/2012  . Nail fungus 03/01/2012  . Ear bleeding 12/10/2011  . Generalized anxiety disorder 07/10/2011  . Parkinson's disease ([place]) 02/03/2011  .  Shoulder pain 02/03/2011  . Recurrent UTI 11/01/2010  . GERD  (gastroesophageal reflux disease) 11/01/2010  . Memory loss 11/01/2010  . COPD, mild ([place]) 09/23/2010  . Hip pain, right 09/23/2010  . COMPRESSION FRACTURE, LUMBAR VERTEBRAE 03/28/2010  . Abdominal pain 03/27/2010  . LACERATION, SCALP 03/27/2010  . Atrial fibrillation ([place]) 03/22/2010  . DIZZINESS 03/22/2010  . Mycotic toenails 10/02/2009  . DIARRHEA-PRESUMED INFECTIOUS 04/02/2009  . DYSPNEA ON EXERTION 01/19/2009  . HEMORRHOIDS-INTERNAL 02/14/2008  . CONSTIPATION 02/14/2008  . PERSONAL HX COLONIC POLYPS 02/14/2008    Mindee Robledo W. 04/06/2015, 12:34 PM  [facility] [address] [place], [place], [address] Phone: [phone]   Fax:  [phone]  Name: [name] MRN: [id number] Date of Birth: 03-01-1928

## 2015-04-06 NOTE — Therapy (Signed)
[facility] [address] [place], [place], [address] Phone: [phone]   Fax:  [phone]  Physical Therapy Treatment  Patient Details  Name: [name] MRN: [id number] Date of Birth: 1928-03-28 Referring Provider: Alonza Bogus, DO  Encounter Date: 04/06/2015      PT End of Session - 04/06/15 1015    Visit Number 6   Number of Visits 17   Date for PT Re-Evaluation 05/06/15   Authorization Type Medicare-Gcode every 10th visit; BCBS secondary   PT Start Time 0848   PT Stop Time 0930   PT Time Calculation (min) 42 min   Activity Tolerance Patient tolerated treatment well   Behavior During Therapy [facility] for tasks assessed/performed      Past Medical History  Diagnosis Date  . Arthritis   . Hyperlipidemia   . 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 Chronic constipation   . Hemorrhoid   . Varicose veins   . Anxiety   . Depression   . GERD (gastroesophageal reflux disease)   . Atrial fibrillation ([place])   . Recurrent UTI   . Parkinson disease ([place])   . Cardiac arrhythmia due to congenital heart disease   . Mycotic toenails 10/27/2012  . COPD (chronic obstructive pulmonary disease) ([place])   . Parkinsonism [facility])     Past Surgical History  Procedure Laterality Date  . Appendectomy  81 years old  . Colon resection  2008    There were no vitals filed for this visit.  Visit Diagnosis:  Postural instability  Abnormality of gait      Subjective Assessment - 04/06/15 0851    Subjective Feel a lot better with doing exercises.   Currently in Pain? Yes   Pain Score 2    Pain Location Back   Pain Orientation Lower   Pain Descriptors / Indicators Aching   Pain Type Chronic pain   Aggravating Factors  picking up heavy things   Pain Relieving Factors back exercises alleviate            [facility] PT Assessment - 04/06/15 0903    Functional Gait  Assessment   Gait Level Surface Walks 20 ft, slow  speed, abnormal gait pattern, evidence for imbalance or deviates 10-15 in outside of the 12 in walkway width. Requires more than 7 sec to ambulate 20 ft.  7.78 sec   Change in Gait Speed Able to change speed, demonstrates mild gait deviations, deviates 6-10 in outside of the 12 in walkway width, or no gait deviations, unable to achieve a major change in velocity, or uses a change in velocity, or uses an assistive device.   Gait with Horizontal Head Turns Performs head turns smoothly with slight change in gait velocity (eg, minor disruption to smooth gait path), deviates 6-10 in outside 12 in walkway width, or uses an assistive device.   Gait with Vertical Head Turns Performs task with moderate change in gait velocity, slows down, deviates 10-15 in outside 12 in walkway width but recovers, can continue to walk.   Gait and Pivot Turn Pivot turns safely in greater than 3 sec and stops with no loss of balance, or pivot turns safely within 3 sec and stops with mild imbalance, requires small steps to catch balance.   Step Over Obstacle Is able to step over one shoe box (4.5 in total height) but must slow down and adjust steps to clear box safely. May require verbal cueing.   Gait with Narrow Base  of Support Ambulates less than 4 steps heel to toe or cannot perform without assistance.   Gait with Eyes Closed Walks 20 ft, slow speed, abnormal gait pattern, evidence for imbalance, deviates 10-15 in outside 12 in walkway width. Requires more than 9 sec to ambulate 20 ft.  14.85 sec   Ambulating Backwards Walks 20 ft, slow speed, abnormal gait pattern, evidence for imbalance, deviates 10-15 in outside 12 in walkway width.  25.58 sec in 20 ft.   Steps Alternating feet, must use rail.   Total Score 13   FGA comment: Scores <15/30 are indicative of increased fall risk.                     [facility] Adult PT Treatment/Exercise - 04/06/15 0903    Transfers   Transfers Sit to Stand;Stand to Sit   Sit to  Stand 6: Modified independent (Device/Increase time);Without upper extremity assist;From chair/3-in-1  from 18" and 16" surfaces   Stand to Sit 6: Modified independent (Device/Increase time);Without upper extremity assist;To chair/3-in-1   Number of Reps 10 reps  each from 18", 16" surfaces   Ambulation/Gait   Ambulation/Gait Yes   Ambulation/Gait Assistance 5: Supervision   Ambulation Distance (Feet) 120 Feet  200, then 120 x 2   Assistive device Straight cane   Gait Pattern Step-through pattern;Decreased arm swing - left;Decreased step length - right;Decreased step length - left;Narrow base of support   Ambulation Surface Level;Indoor   Gait Comments Pt's gait pattern improves with cues for slowed pacing with gait. After about 100 ft, pt does tend to speed up gait pattern, taking smaller steps.  Therapist provides verbal cues and supervision   Standardized Balance Assessment   Standardized Balance Assessment Timed Up and Go Test   Timed Up and Go Test   TUG Normal TUG   Normal TUG (seconds) 14.09     Gait training activities with turning practice-gait with wide U-turns with cane, turns with marching in place for changing directions, turns with wide based of support/clock-quarter turns with cane, with supervision and cues.  Variable gait activities in gym area with cues for turning practice, with supervision-added turns to HEP.           PT Education - 04/06/15 1013    Education provided Yes   Education Details POC discussion, goal check progress, and turning practice   Person(s) Educated Patient   Methods Explanation;Demonstration;Handout   Comprehension Verbalized understanding;Returned demonstration;Verbal cues required          PT Short Term Goals - 04/06/15 0900    PT SHORT TERM GOAL #1   Title Pt will perform HEP independently, to address Parkinson's specific deficits, for improved functional mobility.  TARGET 04/06/15   Time 4   Period Weeks   Status Partially  Met   PT SHORT TERM GOAL #2   Title Pt will perform sit<>stand transfers from 18 inches or below, at least 8 of 10 trials without UE support, for improved transfer efficiency, safety and functional strength.   Time 4   Period Weeks   Status Achieved   PT SHORT TERM GOAL #3   Title Pt will improve TUG score to less than or equal to 13.5 seconds for improved turns with gait and decreased fall risk.   Baseline 14.07 sec (improved from 14.77 sec)-04/06/15   Time 4   Period Weeks   Status Not Met   PT SHORT TERM GOAL #4   Title Pt will improve Functional Gait  Assessment to at least 16/30 for decreased fall risk.   Time 4   Period Weeks   Status Not Met   PT SHORT TERM GOAL #5   Title Pt will verbalize understanding of local Parkinson's disease resources.   Time 4   Period Weeks   Status On-going           PT Long Term Goals - 04/04/15 1045    PT LONG TERM GOAL #1   Title Pt will verbalize understanding of fall prevention within the home environment.  TARGET 05/06/15   Time 8   Period Weeks   Status New   PT LONG TERM GOAL #2   Title Pt will improve gait velocity to at least 3 ft/sec for improved safety and efficiency with gait.   Time 8   Period Weeks   Status New   PT LONG TERM GOAL #3   Title Pt will improve TUG manual score to less than or equal to 14.5 seconds for improved dual tasking with gait and decreased fall risk.   Time 8   Period Weeks   Status New   PT LONG TERM GOAL #4   Title Pt will improve Functional Gait Assessment to at least 20/30 for decreased fall risk.   Time 8   Period Weeks   Status New   PT LONG TERM GOAL #5   Title Pt will verbalize understanding of optimal community and ongoing fitness activities upon D/C from PT.   Time 8   Period Weeks   Status New               Plan - 04/06/15 1225    Clinical Impression Statement Pt has met STG #2 for transfers.  STG #3 and 4 not met; STG #5 ongoing.  Pt is feeling stronger and is  demonstrating improved efficiency with gait in varied measures; however, she appears safer to use cane with gait.  Discussed and practiced methods for safe turning with gait today.  Pt feels she will be ready for discharge after scheduled visits in January.   Pt will benefit from skilled therapeutic intervention in order to improve on the following deficits Abnormal gait;Decreased balance;Decreased mobility;Decreased strength;Decreased coordination;Difficulty walking;Postural dysfunction   Rehab Potential Good   PT Frequency 2x / week   PT Duration 8 weeks  plus eval   PT Treatment/Interventions ADLs/Self Care Home Management;Therapeutic exercise;Therapeutic activities;Functional mobility training;Gait training;Balance training;Neuromuscular re-education;Patient/family education   PT Next Visit Plan Dynamic Balance activities at counter-try to add to HEP; review turning activities; help pt prepare for transition back to ACT early-mid January   Consulted and Agree with Plan of Care Patient        Problem List Patient Active Problem List   Diagnosis Date Noted  . PCP NOTES >>>> 03/02/2015  . Atrial fibrillation with RVR ([place]) 01/31/2015  . Elevated troponin 01/31/2015  . Cough with sputum 01/31/2015  . COPD (chronic obstructive pulmonary disease) ([place]) 01/31/2015  . Lower abdominal pain 12/05/2014  . Acute bacterial bronchitis 10/04/2014  . Other fatigue 06/21/2014  . Lightheadedness 06/12/2014  . Concussion 11/21/2013  . Fall from other slipping, tripping, or stumbling 11/17/2013  . Concussion without loss of consciousness 11/17/2013  . COPD exacerbation ([place]) 09/26/2013  . Sinusitis, acute maxillary 07/19/2012  . Musculoskeletal pain 05/21/2012  . IBS (irritable bowel syndrome) 04/02/2012  . Nail fungus 03/01/2012  . Ear bleeding 12/10/2011  . Generalized anxiety disorder 07/10/2011  . Parkinson's disease ([place]) 02/03/2011  . Shoulder  pain 02/03/2011  . Recurrent UTI 11/01/2010   . GERD (gastroesophageal reflux disease) 11/01/2010  . Memory loss 11/01/2010  . COPD, mild ([place]) 09/23/2010  . Hip pain, right 09/23/2010  . COMPRESSION FRACTURE, LUMBAR VERTEBRAE 03/28/2010  . Abdominal pain 03/27/2010  . LACERATION, SCALP 03/27/2010  . Atrial fibrillation ([place]) 03/22/2010  . DIZZINESS 03/22/2010  . Mycotic toenails 10/02/2009  . DIARRHEA-PRESUMED INFECTIOUS 04/02/2009  . DYSPNEA ON EXERTION 01/19/2009  . HEMORRHOIDS-INTERNAL 02/14/2008  . CONSTIPATION 02/14/2008  . PERSONAL HX COLONIC POLYPS 02/14/2008    Cynthie Garmon W. 04/06/2015, 12:29 PM Frazier Butt., PT [facility] [address] [place], [place], [address] Phone: [phone]   Fax:  [phone]  Name: [name] MRN: [id number] Date of Birth: 1927/07/23

## 2015-04-10 ENCOUNTER — Ambulatory Visit: Payer: Self-pay | Admitting: Physical Therapy

## 2015-04-11 ENCOUNTER — Ambulatory Visit (INDEPENDENT_AMBULATORY_CARE_PROVIDER_SITE_OTHER): Payer: Medicare Other | Admitting: Internal Medicine

## 2015-04-11 ENCOUNTER — Encounter: Payer: Self-pay | Admitting: Internal Medicine

## 2015-04-11 VITALS — BP 102/62 | HR 89 | Temp 97.9°F | Ht 70.0 in | Wt 156.4 lb

## 2015-04-11 DIAGNOSIS — J441 Chronic obstructive pulmonary disease with (acute) exacerbation: Secondary | ICD-10-CM | POA: Diagnosis not present

## 2015-04-11 DIAGNOSIS — Z09 Encounter for follow-up examination after completed treatment for conditions other than malignant neoplasm: Secondary | ICD-10-CM | POA: Diagnosis not present

## 2015-04-11 MED ORDER — AZITHROMYCIN 250 MG PO TABS: ORAL_TABLET | ORAL | Status: DC

## 2015-04-11 MED ORDER — AZELASTINE HCL 0.1 % NA SOLN: 2.0000 | Freq: Every evening | NASAL | Status: DC | PRN

## 2015-04-11 NOTE — Assessment & Plan Note (Signed)
Mild COPD exacerbation: See instructions. We will try conservative treatment,dc OTC decongestants! if not better to start a Z-Pak. Also needs a letter for an airline. Anxiety depression: 03-07-2015 symptoms not well-controlled, change from sertraline to fluoxetine. She seems to be very well today, has a f/u 04-20-15

## 2015-04-11 NOTE — Patient Instructions (Signed)
Rest, fluids , tylenol  For cough:  Take Robitussin DM twice a day as needed until better  For nasal congestion: Use OTC Nasocort or Flonase : 2 nasal sprays on each side of the nose in the morning until you feel better Use ASTELIN a prescribed spray : 2 nasal sprays on each side of the nose at night until you feel better   Avoid decongestants such as  Pseudoephedrine or phenylephrine      Take the antibiotic as prescribed  (zithromax) ONLY if no better in 4-5 days   Call if not gradually better over the next  10 days  Call anytime if the symptoms are severe, you have high fever, short of breath, chest pain

## 2015-04-11 NOTE — Progress Notes (Signed)
Pre visit review using our clinic review tool, if applicable. No additional management support is needed unless otherwise documented below in the visit note. 

## 2015-04-11 NOTE — Progress Notes (Signed)
Subjective:    Patient ID: [name], female    DOB: 10-Jun-1927, 81 y.o.   MRN: [id number]  DOS:  04/11/2015 Type of visit - description : Acute visit Interval history:  Symptoms started 3 days ago with frontal headache, green nasal discharge, cough. Mild sputum production. She is taking a Mucinex product that contains phenylephrine.   Review of Systems Denies fever chills No chest pain, shortness of breath remains at baseline however she has noted increased wheezing  Past Medical History  Diagnosis Date  . Arthritis   . Hyperlipidemia   . 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 Chronic constipation   . Hemorrhoid   . Varicose veins   . Anxiety   . Depression   . GERD (gastroesophageal reflux disease)   . Atrial fibrillation ([place])   . Recurrent UTI   . Parkinson disease ([place])   . Cardiac arrhythmia due to congenital heart disease   . Mycotic toenails 10/27/2012  . COPD (chronic obstructive pulmonary disease) ([place])   . Parkinsonism [facility])     Past Surgical History  Procedure Laterality Date  . Appendectomy  81 years old  . Colon resection  2008    Social History   Social History  . Marital Status: Widowed    Spouse Name: N/A  . Number of Children: 2  . Years of Education: N/A   Occupational History  . retired    Social History Main Topics  . Smoking status: Former Smoker -- 2.00 packs/day for 30 years    Types: Cigarettes    Quit date: 04/21/1980  . Smokeless tobacco: Never Used     Comment: onset age 39 -59, up to > 1ppd (almost 2 ppd)  . Alcohol Use: No  . Drug Use: No  . Sexual Activity: No   Other Topics Concern  . Not on file   Social History Narrative   Widowed, lives alone. 1 child in [place] and 1 in [place].   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        Medication List       This list is accurate as of: 04/11/15  5:16 PM.  Always use your most recent med list.               albuterol 108 (90 BASE) MCG/ACT inhaler  Commonly  known as:  VENTOLIN HFA  Inhale 2 puffs into the lungs 2 (two) times daily as needed for wheezing or shortness of breath.     apixaban 5 MG Tabs tablet  Commonly known as:  ELIQUIS  Take 5 mg by mouth 2 (two) times daily.     azelastine 0.1 % nasal spray  Commonly known as:  ASTELIN  Place 2 sprays into both nostrils at bedtime as needed for rhinitis. Use in each nostril as directed     azithromycin 250 MG tablet  Commonly known as:  ZITHROMAX Z-PAK  2 tabs a day the first day, then 1 tab a day x 4 days     budesonide-formoterol 160-4.5 MCG/ACT inhaler  Commonly known as:  SYMBICORT  Inhale 2 puffs into the lungs 2 (two) times daily.     CALCIUM 1000 + D PO  Take 1,000 mg by mouth daily.     celecoxib 100 MG capsule  Commonly known as:  CELEBREX  Take 100 mg by mouth daily as needed (back pain).     diltiazem 30 MG tablet  Commonly known as:  CARDIZEM  Take 1 tablet (30  mg total) by mouth 4 (four) times daily. Take at onset on atrial fibrillation     flecainide 100 MG tablet  Commonly known as:  TAMBOCOR  Take 50 mg by mouth daily as needed.     FLUoxetine 20 MG capsule  Commonly known as:  PROZAC  Take 2 capsules (40 mg total) by mouth daily.     trimethoprim 100 MG tablet  Commonly known as:  TRIMPEX  Take 100 mg by mouth daily.     vitamin B-12 1000 MCG tablet  Commonly known as:  CYANOCOBALAMIN  Take 1,000 mcg by mouth daily.     vitamin C 1000 MG tablet  Take 1,000 mg by mouth daily.     zoster vaccine live (PF) 19400 UNT/0.65ML injection  Commonly known as:  ZOSTAVAX  Inject 19,400 Units into the skin once.           Objective:   Physical Exam BP 102/62 mmHg  Pulse 89  Temp(Src) 97.9 F (36.6 C) (Oral)  Ht '5\' 10"'$  (1.778 m)  Wt 156 lb 6 oz (70.931 kg)  BMI 22.44 kg/m2  SpO2 96% General:   Well developed, well nourished . NAD.  HEENT:  Normocephalic . Face symmetric, atraumatic. Nose congested, sinuses slightly TTP throughout Lungs:  slt  decreased BS, minimal rhonchi, no wheezing Normal respiratory effort, no intercostal retractions, no accessory muscle use. Heart: Irregular No pretibial edema bilaterally  Skin: Not pale. Not jaundice Neurologic:  alert & oriented X3.  Speech normal, gait appropriate for age and unassisted Psych--  Cognition and judgment appear intact.  Cooperative with normal attention span and concentration.  Behavior appropriate. No anxious or depressed appearing.      Assessment & Plan:   Assessment > Hyperlipidemia Anxiety depression  Dr Ferdinand Lango (psych), meds rx by pcp COPD   P-Atrial fibrillation Dr Gerrit Friends, flecanaide prn /cardiazem prn GI: --GERD --Abnormal abdominal US mild pancreatic duct enlargement  --IBS used to be constipated, now diarrhea predominant DJD take Celebrex very seldom Recurrent UTIs -- qd abx , has seenn urology Dr Edwena Blow before Parkinson disease  Dr Tat admitted  01-2015: COPD and RVR  PLAN Mild COPD exacerbation: See instructions. We will try conservative treatment,dc OTC decongestants! if not better to start a Z-Pak. Also needs a letter for an airline. Anxiety depression: 03-07-2015 symptoms not well-controlled, change from sertraline to fluoxetine. She seems to be very well today, has a f/u 04-20-15

## 2015-04-17 ENCOUNTER — Telehealth: Payer: Self-pay | Admitting: Internal Medicine

## 2015-04-17 ENCOUNTER — Ambulatory Visit (INDEPENDENT_AMBULATORY_CARE_PROVIDER_SITE_OTHER): Payer: Medicare Other | Admitting: Family Medicine

## 2015-04-17 ENCOUNTER — Encounter: Payer: Self-pay | Admitting: Family Medicine

## 2015-04-17 VITALS — BP 96/60 | HR 70 | Temp 98.0°F | Ht 70.0 in | Wt 156.9 lb

## 2015-04-17 DIAGNOSIS — F418 Other specified anxiety disorders: Secondary | ICD-10-CM | POA: Diagnosis not present

## 2015-04-17 DIAGNOSIS — F329 Major depressive disorder, single episode, unspecified: Secondary | ICD-10-CM

## 2015-04-17 DIAGNOSIS — F419 Anxiety disorder, unspecified: Principal | ICD-10-CM

## 2015-04-17 DIAGNOSIS — F32A Depression, unspecified: Secondary | ICD-10-CM

## 2015-04-17 NOTE — Progress Notes (Signed)
Pre visit review using our clinic review tool, if applicable. No additional management support is needed unless otherwise documented below in the visit note. 

## 2015-04-17 NOTE — Progress Notes (Signed)
HPI:  Acute visit for:  GAD/Depression: -reports sees Dr. Ferdinand Lango for this whom only does therapy and Dr. Larose Kells prescribes antidepressant -reports was having a bad day at one of her sessions with Dr. Ferdinand Lango and was taken of sertraline and started on prozac which she has been titrating for several weeks -since going to the 40 mg dose this last week feels out of it and more tired then usual and feels this medication is the cause -reports had a sinus infection that was treated and is is better -denies: fever, dizziness, cough, dysuria, nvd, SOB, DOE, CP, palpitations -reports her anxiety and depression are mild and she wonders if she even needs to be on a medication for this -she is seeing her doctor in 2 days for follow up - but wanted to decrease the antidepressant and did not want to do this without discussing with a physician  ROS: See pertinent positives and negatives per HPI.  Past Medical History  Diagnosis Date  . Arthritis   . Hyperlipidemia   . 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 Chronic constipation   . Hemorrhoid   . Varicose veins   . Anxiety   . Depression   . GERD (gastroesophageal reflux disease)   . Atrial fibrillation ([place])   . Recurrent UTI   . Parkinson disease ([place])   . Cardiac arrhythmia due to congenital heart disease   . Mycotic toenails 10/27/2012  . COPD (chronic obstructive pulmonary disease) ([place])   . Parkinsonism [facility])     Past Surgical History  Procedure Laterality Date  . Appendectomy  81 years old  . Colon resection  2008    Family History  Problem Relation Age of Onset  . Asthma Brother   . Alcohol abuse Brother   . Throat cancer Father   . Alcohol abuse Father   . Lupus Daughter   . Bipolar disorder Daughter   . Lupus Daughter   . Anxiety disorder Daughter   . Alcohol abuse Sister   . Alcohol abuse Maternal Grandfather   . Alcohol abuse Paternal Grandmother     Social History   Social History  . Marital Status: Widowed   Spouse Name: N/A  . Number of Children: 2  . Years of Education: N/A   Occupational History  . retired    Social History Main Topics  . Smoking status: Former Smoker -- 2.00 packs/day for 30 years    Types: Cigarettes    Quit date: 04/21/1980  . Smokeless tobacco: Never Used     Comment: onset age 42 -49, up to > 1ppd (almost 2 ppd)  . Alcohol Use: No  . Drug Use: No  . Sexual Activity: No   Other Topics Concern  . None   Social History Narrative   Widowed, lives alone. 1 child in [place] and 1 in [place].   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     Current outpatient prescriptions:  .  albuterol (VENTOLIN HFA) 108 (90 BASE) MCG/ACT inhaler, Inhale 2 puffs into the lungs 2 (two) times daily as needed for wheezing or shortness of breath., Disp: 18 g, Rfl: 4 .  apixaban (ELIQUIS) 5 MG TABS tablet, Take 5 mg by mouth 2 (two) times daily., Disp: , Rfl:  .  Ascorbic Acid (VITAMIN C) 1000 MG tablet, Take 1,000 mg by mouth daily., Disp: , Rfl:  .  azelastine (ASTELIN) 0.1 % nasal spray, Place 2 sprays into both nostrils at bedtime as needed for rhinitis. Use in each nostril  as directed, Disp: 30 mL, Rfl: 3 .  azithromycin (ZITHROMAX Z-PAK) 250 MG tablet, 2 tabs a day the first day, then 1 tab a day x 4 days, Disp: 6 tablet, Rfl: 0 .  budesonide-formoterol (SYMBICORT) 160-4.5 MCG/ACT inhaler, Inhale 2 puffs into the lungs 2 (two) times daily., Disp: 3 Inhaler, Rfl: 3 .  Calcium Carb-Cholecalciferol (CALCIUM 1000 + D PO), Take 1,000 mg by mouth daily., Disp: , Rfl:  .  celecoxib (CELEBREX) 100 MG capsule, Take 100 mg by mouth daily as needed (back pain)., Disp: , Rfl:  .  diltiazem (CARDIZEM) 30 MG tablet, Take 1 tablet (30 mg total) by mouth 4 (four) times daily. Take at onset on atrial fibrillation, Disp: 30 tablet, Rfl: 0 .  flecainide (TAMBOCOR) 100 MG tablet, Take 50 mg by mouth daily as needed., Disp: , Rfl:  .  FLUoxetine (PROZAC) 20 MG capsule, Take 2 capsules (40 mg total) by mouth daily.,  Disp: 60 capsule, Rfl: 0 .  trimethoprim (TRIMPEX) 100 MG tablet, Take 100 mg by mouth daily., Disp: , Rfl:  .  vitamin B-12 (CYANOCOBALAMIN) 1000 MCG tablet, Take 1,000 mcg by mouth daily., Disp: , Rfl:  .  zoster vaccine live, PF, (ZOSTAVAX) 24497 UNT/0.65ML injection, Inject 19,400 Units into the skin once., Disp: 1 each, Rfl: 0  EXAM:  Filed Vitals:   04/17/15 1508  BP: 96/60  Pulse: 70  Temp: 98 F (36.7 C)    Body mass index is 22.51 kg/(m^2).  GENERAL: vitals reviewed and listed above, alert, oriented, appears well hydrated and in no acute distress  HEENT: atraumatic, conjunttiva clear, no obvious abnormalities on inspection of external nose and ears  NECK: no obvious masses on inspection  LUNGS: clear to auscultation bilaterally, no wheezes, rales or rhonchi, good air movement  CV: HRRR, no peripheral edema  MS: moves all extremities without noticeable abnormality  PSYCH: pleasant and cooperative, no obvious depression or anxiety  ASSESSMENT AND PLAN:  Discussed the following assessment and plan:  Anxiety and depression  -she appears well today -she is anxious about troubling [place] today - I assured her this is what we are here for and that she is right to discuss any medication changes with doctor prior to making a change -we opted to go back down to '20mg'$  dose of her prozac to see if she feels better  -she has follow up with PCP later this week - does not seem to have any lingering signs or symptoms of resp infection  -Patient advised to return or notify a doctor immediately if symptoms worsen or persist or new concerns arise.  There are no Patient Instructions on file for this visit.   Colin [place] R.

## 2015-04-17 NOTE — Telephone Encounter (Signed)
error 

## 2015-04-19 ENCOUNTER — Ambulatory Visit: Payer: Medicare Other | Admitting: Physical Therapy

## 2015-04-19 DIAGNOSIS — R269 Unspecified abnormalities of gait and mobility: Secondary | ICD-10-CM

## 2015-04-19 DIAGNOSIS — R2681 Unsteadiness on feet: Secondary | ICD-10-CM | POA: Diagnosis not present

## 2015-04-19 DIAGNOSIS — R293 Abnormal posture: Secondary | ICD-10-CM | POA: Diagnosis not present

## 2015-04-19 NOTE — Patient Instructions (Addendum)
Side-Stepping    Stand, facing your counter and hold for support.  Walk to left side with eyes open. Take even steps, leading with same foot, to the end of the counter. Make sure each foot lifts off the floor. Repeat in opposite direction to the end of the counter. Repeat for 3-4 lengths of the counter, 1-2 times per day.   Copyright  VHI. All rights reserved.  Forward/Backward Walking    Walk forward to the end of the counter, then walk backwards. Take even steps, making sure each foot lifts off floor. Repeat for _3___ lengths of the counter, 1-2 times per day.  Make sure to hold to the counter for support.   Copyright  VHI. All rights reserved.  FUNCTIONAL MOBILITY: Marching Forward    March in place by lifting left leg up, then right. Alternate. Then march forward to the end of the counter, then take your time turning to face the opposite way and march to the end of the counter.  Do this 3 lengths of the counter. 1-2 times per day. Hold onto counter for support.  Copyright  VHI. All rights reserved.

## 2015-04-19 NOTE — Therapy (Signed)
[facility] [address] [place], [place], [address] Phone: [phone]   Fax:  [phone]  Physical Therapy Treatment  Patient Details  Name: [name] MRN: [id number] Date of Birth: [birth year] Referring Provider: Alonza Bogus, DO  Encounter Date: 04/19/2015      PT End of Session - 04/19/15 1152    Visit Number 7   Number of Visits 17   Date for PT Re-Evaluation 05/06/15   Authorization Type Medicare-Gcode every 10th visit; BCBS secondary   PT Start Time 1104   PT Stop Time 1144   PT Time Calculation (min) 40 min   Activity Tolerance Patient tolerated treatment well   Behavior During Therapy [facility] for tasks assessed/performed      Past Medical History  Diagnosis Date  . Arthritis   . Hyperlipidemia   . 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 Chronic constipation   . Hemorrhoid   . Varicose veins   . Anxiety   . Depression   . GERD (gastroesophageal reflux disease)   . Atrial fibrillation ([place])   . Recurrent UTI   . Parkinson disease ([place])   . Cardiac arrhythmia due to congenital heart disease   . Mycotic toenails 10/27/2012  . COPD (chronic obstructive pulmonary disease) ([place])   . Parkinsonism [facility])     Past Surgical History  Procedure Laterality Date  . Appendectomy  81 years old  . Colon resection  2008    There were no vitals filed for this visit.  Visit Diagnosis:  Abnormality of gait      Subjective Assessment - 04/19/15 1109    Subjective Didn't travel over the holidays due to having a sinus infection-finished up Z-Pac yesterday.   Currently in Pain? Yes   Pain Score 2    Pain Location Shoulder   Pain Orientation Right   Pain Descriptors / Indicators Aching   Pain Type Acute pain   Pain Onset In the past 7 days   Pain Frequency Occasional   Aggravating Factors  unsure   Pain Relieving Factors sleep                         [facility] Adult PT  Treatment/Exercise - 04/19/15 1111    High Level Balance   High Level Balance Activities Side stepping;Backward walking;Turns;Marching forwards  Forward walking, 3 reps length of counter each direction   High Level Balance Comments Performed at counter; marching in place 10 reps with UE support.  Tandem stance 3 reps 30 seconds each leg with light UE support; single limb stance 2 reps 10 seconds each leg with intermittent UE support.   Self-Care   Self-Care Other Self-Care Comments   Other Self-Care Comments  Self Care provided including fall prevention in the home.  Provided handout and discused fall prevention in the home envrionment.   Knee/Hip Exercises: Aerobic   Other Aerobic Foot pedaler, x 5 minutes forward, 1 minute back at max resistance.  Discussed how pt can obtain for home use for improved lower extremity strength and flexibility as well as aerobic activity.    For standing counter exercises, PT provides cues for posture and for appropriate use of UE support for safety with balance exercises.            PT Education - 04/19/15 1152    Education provided Yes   Education Details Fall prevention, provided HEP for counter balance exercises   Person(s) Educated Patient  Methods Explanation;Demonstration;Handout   Comprehension Verbalized understanding;Returned demonstration          PT Short Term Goals - 04/06/15 0900    PT SHORT TERM GOAL #1   Title Pt will perform HEP independently, to address Parkinson's specific deficits, for improved functional mobility.  TARGET 04/06/15   Time 4   Period Weeks   Status Partially Met   PT SHORT TERM GOAL #2   Title Pt will perform sit<>stand transfers from 18 inches or below, at least 8 of 10 trials without UE support, for improved transfer efficiency, safety and functional strength.   Time 4   Period Weeks   Status Achieved   PT SHORT TERM GOAL #3   Title Pt will improve TUG score to less than or equal to 13.5 seconds for  improved turns with gait and decreased fall risk.   Baseline 14.07 sec (improved from 14.77 sec)-04/06/15   Time 4   Period Weeks   Status Not Met   PT SHORT TERM GOAL #4   Title Pt will improve Functional Gait Assessment to at least 16/30 for decreased fall risk.   Time 4   Period Weeks   Status Not Met   PT SHORT TERM GOAL #5   Title Pt will verbalize understanding of local Parkinson's disease resources.   Time 4   Period Weeks   Status On-going           PT Long Term Goals - 04/04/15 1045    PT LONG TERM GOAL #1   Title Pt will verbalize understanding of fall prevention within the home environment.  TARGET 05/06/15   Time 8   Period Weeks   Status New   PT LONG TERM GOAL #2   Title Pt will improve gait velocity to at least 3 ft/sec for improved safety and efficiency with gait.   Time 8   Period Weeks   Status New   PT LONG TERM GOAL #3   Title Pt will improve TUG manual score to less than or equal to 14.5 seconds for improved dual tasking with gait and decreased fall risk.   Time 8   Period Weeks   Status New   PT LONG TERM GOAL #4   Title Pt will improve Functional Gait Assessment to at least 20/30 for decreased fall risk.   Time 8   Period Weeks   Status New   PT LONG TERM GOAL #5   Title Pt will verbalize understanding of optimal community and ongoing fitness activities upon D/C from PT.   Time 8   Period Weeks   Status New               Plan - 04/19/15 1152    Clinical Impression Statement Fall prevention education provided this session, as pt very much wants to discharge after next week's sessions.  Added standing balance activities at counter for HEP.  Pt feels she is doing well over all, but has had decreased energy the last few weeks due to sinus infection.  Pt continues to benefit from further skilled PT to address balance, functional strength, and gait training.   Pt will benefit from skilled therapeutic intervention in order to improve on the  following deficits Abnormal gait;Decreased balance;Decreased mobility;Decreased strength;Decreased coordination;Difficulty walking;Postural dysfunction   Rehab Potential Good   PT Frequency 2x / week   PT Duration 8 weeks  plus eval   PT Treatment/Interventions ADLs/Self Care Home Management;Therapeutic exercise;Therapeutic activities;Functional mobility training;Gait training;Balance training;Neuromuscular re-education;Patient/family  education   PT Next Visit Plan review HEP, review turning activities; dynamic gait activities, assist with transition to community fitness in next 2 weeks.   Consulted and Agree with Plan of Care Patient        Problem List Patient Active Problem List   Diagnosis Date Noted  . PCP NOTES >>>> 03/02/2015  . Atrial fibrillation with RVR ([place]) 01/31/2015  . Elevated troponin 01/31/2015  . Cough with sputum 01/31/2015  . COPD (chronic obstructive pulmonary disease) ([place]) 01/31/2015  . Lower abdominal pain 12/05/2014  . Acute bacterial bronchitis 10/04/2014  . Other fatigue 06/21/2014  . Lightheadedness 06/12/2014  . Concussion 11/21/2013  . Fall from other slipping, tripping, or stumbling 11/17/2013  . Concussion without loss of consciousness 11/17/2013  . COPD exacerbation ([place]) 09/26/2013  . Sinusitis, acute maxillary 07/19/2012  . Musculoskeletal pain 05/21/2012  . IBS (irritable bowel syndrome) 04/02/2012  . Nail fungus 03/01/2012  . Ear bleeding 12/10/2011  . Generalized anxiety disorder 07/10/2011  . Parkinson's disease ([place]) 02/03/2011  . Shoulder pain 02/03/2011  . Recurrent UTI 11/01/2010  . GERD (gastroesophageal reflux disease) 11/01/2010  . Memory loss 11/01/2010  . COPD, mild ([place]) 09/23/2010  . Hip pain, right 09/23/2010  . COMPRESSION FRACTURE, LUMBAR VERTEBRAE 03/28/2010  . Abdominal pain 03/27/2010  . LACERATION, SCALP 03/27/2010  . Atrial fibrillation ([place]) 03/22/2010  . DIZZINESS 03/22/2010  . Mycotic toenails 10/02/2009  .  DIARRHEA-PRESUMED INFECTIOUS 04/02/2009  . DYSPNEA ON EXERTION 01/19/2009  . HEMORRHOIDS-INTERNAL 02/14/2008  . CONSTIPATION 02/14/2008  . PERSONAL HX COLONIC POLYPS 02/14/2008    Keshan Reha W. 04/19/2015, 11:58 AM  Frazier Butt., PT  [facility] [address] [place], [place], [address] Phone: [phone]   Fax:  [phone]  Name: LUCCA GREGGS MRN: [id number] Date of Birth: 1928-02-11

## 2015-04-20 ENCOUNTER — Encounter: Payer: Self-pay | Admitting: Internal Medicine

## 2015-04-20 ENCOUNTER — Ambulatory Visit (INDEPENDENT_AMBULATORY_CARE_PROVIDER_SITE_OTHER): Payer: Medicare Other | Admitting: Internal Medicine

## 2015-04-20 VITALS — BP 120/70 | HR 64 | Temp 97.4°F | Ht 70.0 in | Wt 159.8 lb

## 2015-04-20 DIAGNOSIS — F32A Depression, unspecified: Secondary | ICD-10-CM

## 2015-04-20 DIAGNOSIS — F329 Major depressive disorder, single episode, unspecified: Secondary | ICD-10-CM

## 2015-04-20 DIAGNOSIS — F419 Anxiety disorder, unspecified: Principal | ICD-10-CM

## 2015-04-20 DIAGNOSIS — F418 Other specified anxiety disorders: Secondary | ICD-10-CM

## 2015-04-20 NOTE — Progress Notes (Signed)
Subjective:    Patient ID: [name], female    DOB: [birth year], 81 y.o.   MRN: [id number]  DOS:  04/20/2015 Type of visit - description : Follow-up Interval history: Here for anxiety, depression management. She was doing well the last time I saw her 04/11/2015, however she was seen by one of my partners 04/17/2015 complaining of Prozac not working for her. Today she states that she is feeling tired, more depressed, very sleepy. Symptoms worsen when she increased the dose to 40 mg. On 04/17/2015, prozac dose  was decreased to 20 mg and she is here for follow-up. She feels about the same.  I wonder if  is the holidays that make her anxious/depressed but she states that sx are usually not exacerbated by Xmas.    Review of Systems  When asked, admits to occasional suicidal thoughts, no plans, last time that happened was 5 days ago, no history of previous attempts.  Past Medical History  Diagnosis Date  . Arthritis   . Hyperlipidemia   . 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 Chronic constipation   . Hemorrhoid   . Varicose veins   . Anxiety   . Depression   . GERD (gastroesophageal reflux disease)   . Atrial fibrillation ([place])   . Recurrent UTI   . Parkinson disease ([place])   . Cardiac arrhythmia due to congenital heart disease   . Mycotic toenails 10/27/2012  . COPD (chronic obstructive pulmonary disease) ([place])   . Parkinsonism [facility])     Past Surgical History  Procedure Laterality Date  . Appendectomy  81 years old  . Colon resection  2008    Social History   Social History  . Marital Status: Widowed    Spouse Name: N/A  . Number of Children: 2  . Years of Education: N/A   Occupational History  . retired    Social History Main Topics  . Smoking status: Former Smoker -- 2.00 packs/day for 30 years    Types: Cigarettes    Quit date: 04/21/1980  . Smokeless tobacco: Never Used     Comment: onset age 32 -62, up to > 1ppd (almost 2 ppd)  . Alcohol Use:  No  . Drug Use: No  . Sexual Activity: No   Other Topics Concern  . Not on file   Social History Narrative   Widowed, lives alone. 1 child in [place] and 1 in [place].   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        Medication List       This list is accurate as of: 04/20/15 11:59 PM.  Always use your most recent med list.               albuterol 108 (90 Base) MCG/ACT inhaler  Commonly known as:  VENTOLIN HFA  Inhale 2 puffs into the lungs 2 (two) times daily as needed for wheezing or shortness of breath.     apixaban 5 MG Tabs tablet  Commonly known as:  ELIQUIS  Take 5 mg by mouth 2 (two) times daily.     azelastine 0.1 % nasal spray  Commonly known as:  ASTELIN  Place 2 sprays into both nostrils at bedtime as needed for rhinitis. Use in each nostril as directed     budesonide-formoterol 160-4.5 MCG/ACT inhaler  Commonly known as:  SYMBICORT  Inhale 2 puffs into the lungs 2 (two) times daily.     CALCIUM 1000 + D PO  Take 1,000 mg  by mouth daily.     celecoxib 100 MG capsule  Commonly known as:  CELEBREX  Take 100 mg by mouth daily as needed (back pain).     diltiazem 30 MG tablet  Commonly known as:  CARDIZEM  Take 1 tablet (30 mg total) by mouth 4 (four) times daily. Take at onset on atrial fibrillation     flecainide 100 MG tablet  Commonly known as:  TAMBOCOR  Take 50 mg by mouth daily as needed.     sertraline 100 MG tablet  Commonly known as:  ZOLOFT  Take 100 mg by mouth daily.     trimethoprim 100 MG tablet  Commonly known as:  TRIMPEX  Take 100 mg by mouth daily.     vitamin B-12 1000 MCG tablet  Commonly known as:  CYANOCOBALAMIN  Take 1,000 mcg by mouth daily.     vitamin C 1000 MG tablet  Take 1,000 mg by mouth daily.     zoster vaccine live (PF) 19400 UNT/0.65ML injection  Commonly known as:  ZOSTAVAX  Inject 19,400 Units into the skin once.           Objective:   Physical Exam BP 120/70 mmHg  Pulse 64  Temp(Src) 97.4 F (36.3 C)  (Oral)  Ht '5\' 10"'$  (1.778 m)  Wt 159 lb 12.8 oz (72.485 kg)  BMI 22.93 kg/m2  SpO2 98% General:   Well developed, well nourished . NAD.  HEENT:  Normocephalic . Face symmetric, atraumatic Neurologic:  alert & oriented X3.  Speech normal, gait appropriate for age and unassisted Psych--  Cognition and judgment appear intact.  Cooperative with normal attention span and concentration.  Behavior appropriate. Slightly anxious but nor depressed appearing     Assessment & Plan:   Assessment > Hyperlipidemia Anxiety depression  Dr Ferdinand Lango (psychology), meds rx by pcp COPD   P-Atrial fibrillation Dr Gerrit Friends, flecanaide prn /cardiazem prn GI: --GERD --Abnormal abdominal US mild pancreatic duct enlargement  --IBS used to be constipated, now diarrhea predominant DJD take Celebrex very seldom Recurrent UTIs -- qd abx , has seenn urology Dr Edwena Blow before Parkinson disease  Dr Tat admitted  01-2015: COPD and RVR  PLAN Anxiety depression: was on sertraline for years, switch to Prozac 03/07/2015 but evidently  the new medication is not working well for her, likes to go back on sertraline.  plan: Switch to sertraline 100 mg qd, strongly advise to seek for help if she has more suicidal thoughts, see instructions, she verbalized understanding. RTC 3 weeks for a checkup.

## 2015-04-20 NOTE — Progress Notes (Signed)
Pre visit review using our clinic review tool, if applicable. No additional management support is needed unless otherwise documented below in the visit note. 

## 2015-04-20 NOTE — Patient Instructions (Addendum)
Stop Prozac  Go back to sertraline 100 mg: 1 tablet daily  .  Please be very careful about suicidal ideas, if you have any concerns, call the 24-Hour [facility] [phone] or [phone]  Schedule a visit to 3 weeks from today

## 2015-04-24 ENCOUNTER — Ambulatory Visit: Payer: Medicare Other | Attending: Family Medicine | Admitting: Physical Therapy

## 2015-04-24 DIAGNOSIS — R293 Abnormal posture: Secondary | ICD-10-CM | POA: Diagnosis not present

## 2015-04-24 DIAGNOSIS — R269 Unspecified abnormalities of gait and mobility: Secondary | ICD-10-CM | POA: Diagnosis not present

## 2015-04-24 NOTE — Therapy (Signed)
[facility] [address] [place], [place], [address] Phone: [phone]   Fax:  [phone]  Physical Therapy Treatment  Patient Details  Name: [name] MRN: [id number] Date of Birth: [birth year] Referring Provider: Alonza Bogus, DO  Encounter Date: 04/24/2015      PT End of Session - 04/24/15 2127    Visit Number 8   Number of Visits 17   Date for PT Re-Evaluation 05/06/15   Authorization Type Medicare-Gcode every 10th visit; BCBS secondary   PT Start Time 1107   PT Stop Time 1147   PT Time Calculation (min) 40 min   Activity Tolerance Patient tolerated treatment well   Behavior During Therapy [facility] for tasks assessed/performed      Past Medical History  Diagnosis Date  . Arthritis   . Hyperlipidemia   . 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 Chronic constipation   . Hemorrhoid   . Varicose veins   . Anxiety   . Depression   . GERD (gastroesophageal reflux disease)   . Atrial fibrillation ([place])   . Recurrent UTI   . Parkinson disease ([place])   . Cardiac arrhythmia due to congenital heart disease   . Mycotic toenails 10/27/2012  . COPD (chronic obstructive pulmonary disease) ([place])   . Parkinsonism [facility])     Past Surgical History  Procedure Laterality Date  . Appendectomy  82 years old  . Colon resection  2008    There were no vitals filed for this visit.  Visit Diagnosis:  Abnormality of gait      Subjective Assessment - 04/24/15 1109    Subjective Looking forward to returning to [facility].   Patient Stated Goals Pt's goal for therapy is to have better balance.   Currently in Pain? No/denies                         [facility] Adult PT Treatment/Exercise - 04/24/15 0001    Transfers   Transfers Sit to Stand;Stand to Sit   Sit to Stand 6: Modified independent (Device/Increase time);Without upper extremity assist;From chair/3-in-1   Stand to Sit 6: Modified independent  (Device/Increase time);Without upper extremity assist;To chair/3-in-1   Ambulation/Gait   Ambulation/Gait Yes   Ambulation/Gait Assistance 5: Supervision   Ambulation/Gait Assistance Details cues provided to sequence step length with cane;tends to speed up gait but decrease step length and only moves cane for every 2-3 steps with gait   Ambulation Distance (Feet) 500 Feet  times 3 plus treadmill x 6 minutes working on step length   Assistive device Straight cane;Other (Comment)  bil walking poles and wth PTA assisting armswing with poles   Gait Pattern Step-through pattern;Decreased arm swing - left;Decreased step length - right;Decreased step length - left;Narrow base of support   Ambulation Surface Level;Indoor   Gait Comments bil walking poles trialed to see if pts armswing would improve along with step length yet pt still continues to increase speed and shorten steps without cues;gait pattern continues to improve with cues but quickly reverts back to increased pace and decreased step length   Self-Care   Self-Care Other Self-Care Comments   Other Self-Care Comments  Discussed optimal community fitness, scheduling fitness and slowly reintroducing previous workout routine at [facility]             Balance Exercises - 04/24/15 2131    Balance Exercises: Standing   Other Standing Exercises sidestepping and forward/backward walking at counter with UE  support.  Reviewed turns in 1/4 direction round robin.  Forward marching along counter with UE support.           PT Education - 04/24/15 2137    Education provided Yes   Education Details Optimal community fitness, reviewed HEP, discussed slowly reintroducing fitness at [facility] (start with 10 minutes of treadmill and 10 minutes of bike)   Person(s) Educated Patient   Methods Explanation;Demonstration;Handout   Comprehension Verbalized understanding;Returned demonstration          PT Short Term Goals - 04/06/15 0900    PT SHORT TERM GOAL  #1   Title Pt will perform HEP independently, to address Parkinson's specific deficits, for improved functional mobility.  TARGET 04/06/15   Time 4   Period Weeks   Status Partially Met   PT SHORT TERM GOAL #2   Title Pt will perform sit<>stand transfers from 18 inches or below, at least 8 of 10 trials without UE support, for improved transfer efficiency, safety and functional strength.   Time 4   Period Weeks   Status Achieved   PT SHORT TERM GOAL #3   Title Pt will improve TUG score to less than or equal to 13.5 seconds for improved turns with gait and decreased fall risk.   Baseline 14.07 sec (improved from 14.77 sec)-04/06/15   Time 4   Period Weeks   Status Not Met   PT SHORT TERM GOAL #4   Title Pt will improve Functional Gait Assessment to at least 16/30 for decreased fall risk.   Time 4   Period Weeks   Status Not Met   PT SHORT TERM GOAL #5   Title Pt will verbalize understanding of local Parkinson's disease resources.   Time 4   Period Weeks   Status On-going           PT Long Term Goals - 04/04/15 1045    PT LONG TERM GOAL #1   Title Pt will verbalize understanding of fall prevention within the home environment.  TARGET 05/06/15   Time 8   Period Weeks   Status New   PT LONG TERM GOAL #2   Title Pt will improve gait velocity to at least 3 ft/sec for improved safety and efficiency with gait.   Time 8   Period Weeks   Status New   PT LONG TERM GOAL #3   Title Pt will improve TUG manual score to less than or equal to 14.5 seconds for improved dual tasking with gait and decreased fall risk.   Time 8   Period Weeks   Status New   PT LONG TERM GOAL #4   Title Pt will improve Functional Gait Assessment to at least 20/30 for decreased fall risk.   Time 8   Period Weeks   Status New   PT LONG TERM GOAL #5   Title Pt will verbalize understanding of optimal community and ongoing fitness activities upon D/C from PT.   Time 8   Period Weeks   Status New                Plan - 04/24/15 2141    Clinical Impression Statement Pt feels comfortable to d/c next session and return to [facility].  Noted decreased endurance on treadmill today as pt was previous doing 30 minutes of cardio and a 40 minute class previously at [facility].  Continues to need cues to improve gait pattern.  Continue PT per POC.   Pt will benefit from  skilled therapeutic intervention in order to improve on the following deficits Abnormal gait;Decreased balance;Decreased mobility;Decreased strength;Decreased coordination;Difficulty walking;Postural dysfunction   Rehab Potential Good   PT Frequency 2x / week   PT Duration 8 weeks  plus eval   PT Treatment/Interventions ADLs/Self Care Home Management;Therapeutic exercise;Therapeutic activities;Functional mobility training;Gait training;Balance training;Neuromuscular re-education;Patient/family education   PT Next Visit Plan Check LTG's and d/c per Mady Haagensen, PT.   Consulted and Agree with Plan of Care Patient        Problem List Patient Active Problem List   Diagnosis Date Noted  . PCP NOTES >>>> 03/02/2015  . Atrial fibrillation with RVR ([place]) 01/31/2015  . Elevated troponin 01/31/2015  . Cough with sputum 01/31/2015  . COPD (chronic obstructive pulmonary disease) ([place]) 01/31/2015  . Lower abdominal pain 12/05/2014  . Acute bacterial bronchitis 10/04/2014  . Other fatigue 06/21/2014  . Lightheadedness 06/12/2014  . Concussion 11/21/2013  . Fall from other slipping, tripping, or stumbling 11/17/2013  . Concussion without loss of consciousness 11/17/2013  . COPD exacerbation ([place]) 09/26/2013  . Sinusitis, acute maxillary 07/19/2012  . Musculoskeletal pain 05/21/2012  . IBS (irritable bowel syndrome) 04/02/2012  . Nail fungus 03/01/2012  . Ear bleeding 12/10/2011  . Generalized anxiety disorder 07/10/2011  . Parkinson's disease ([place]) 02/03/2011  . Shoulder pain 02/03/2011  . Recurrent UTI 11/01/2010  . GERD  (gastroesophageal reflux disease) 11/01/2010  . Memory loss 11/01/2010  . COPD, mild ([place]) 09/23/2010  . Hip pain, right 09/23/2010  . COMPRESSION FRACTURE, LUMBAR VERTEBRAE 03/28/2010  . Abdominal pain 03/27/2010  . LACERATION, SCALP 03/27/2010  . Atrial fibrillation ([place]) 03/22/2010  . DIZZINESS 03/22/2010  . Mycotic toenails 10/02/2009  . DIARRHEA-PRESUMED INFECTIOUS 04/02/2009  . DYSPNEA ON EXERTION 01/19/2009  . HEMORRHOIDS-INTERNAL 02/14/2008  . CONSTIPATION 02/14/2008  . PERSONAL HX COLONIC POLYPS 02/14/2008    Narda Bonds 04/24/2015, 9:44 PM  [facility] [address] [place], [place], [address] Phone: [phone]   Fax:  [phone]  Name: [name] MRN: [id number] Date of Birth: 25-Nov-1927    Narda Bonds, [place] 04/24/2015 9:44 PM Phone: [phone] Fax: [phone]

## 2015-04-24 NOTE — Patient Instructions (Signed)
(  Exercise) Monday Tuesday Wednesday Thursday Friday Saturday Sunday   Exercises from [employer] days a week for 30 minutes          Cardio-3 days a week for 30 minutes (treadmill, bike)                                                                                Optimal Fitness Program after Therapy for People with Parkinson's Disease  1)  Therapy Home Exercise Program  -Do these Exercises DAILY as instructed by your therapist  -Big, deliberate effort with exercises  -These exercises are important to perform consistently, even when therapist has  finished, because these therapy exercises often address your specific Parkinson's difficulties   2)  Walking  -  Work up to walking 3-5 times per week, 20-30 minutes per day  -This can be done at home, driveway, quiet street or an indoor track  -Focus should be on your Best posture, arm swing, step length for your best  walking pattern  3)  Aerobic Exercise  -Work up to 3-5 times per week, 30 minutes per day  -This can be stationary bike, seated stepper machine, elliptical machine  -Work up to 7-8/10 intensity during the exercise, at minimal to moderate     Resistance

## 2015-04-25 ENCOUNTER — Telehealth: Payer: Self-pay

## 2015-04-25 NOTE — Telephone Encounter (Signed)
Pt left Handicap Placard Form at office at [facility] w/ Dr. Larose Kells on 04/20/2015. Copy of form sent for scanning and original placed in mail back to Pt.

## 2015-04-26 ENCOUNTER — Ambulatory Visit (INDEPENDENT_AMBULATORY_CARE_PROVIDER_SITE_OTHER): Payer: Medicare Other | Admitting: Neurology

## 2015-04-26 ENCOUNTER — Encounter: Payer: Self-pay | Admitting: Neurology

## 2015-04-26 ENCOUNTER — Ambulatory Visit: Payer: Medicare Other | Admitting: Physical Therapy

## 2015-04-26 VITALS — BP 132/70 | HR 80 | Ht 70.0 in | Wt 157.0 lb

## 2015-04-26 DIAGNOSIS — G214 Vascular parkinsonism: Secondary | ICD-10-CM | POA: Diagnosis not present

## 2015-04-26 DIAGNOSIS — R293 Abnormal posture: Secondary | ICD-10-CM | POA: Diagnosis not present

## 2015-04-26 DIAGNOSIS — R269 Unspecified abnormalities of gait and mobility: Secondary | ICD-10-CM | POA: Diagnosis not present

## 2015-04-26 DIAGNOSIS — R4189 Other symptoms and signs involving cognitive functions and awareness: Secondary | ICD-10-CM

## 2015-04-26 DIAGNOSIS — R413 Other amnesia: Secondary | ICD-10-CM | POA: Diagnosis not present

## 2015-04-26 DIAGNOSIS — F330 Major depressive disorder, recurrent, mild: Secondary | ICD-10-CM | POA: Diagnosis not present

## 2015-04-26 DIAGNOSIS — F6811 Factitious disorder with predominantly psychological signs and symptoms: Secondary | ICD-10-CM

## 2015-04-26 NOTE — Progress Notes (Signed)
[name] was seen today in the movement disorders clinic for neurologic consultation at the request of [name].  Her PCP is Catherine November, MD.  She is accompanied by her friend who supplements the history.  Prior records made available to me were reviewed.  The patient apparently was diagnosed with Parkinson's disease by [name] in 2011, but thinks she had symptoms long before that.  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  [name] records indicate that the patient was never very accepting of the diagnosis.  He did start her on levodopa but she only worked up to one tablet twice a day and ultimately ended up stopping the medication in February, 2014 after she didn't think that the medication was helpful.  She states "I didn't accept the diagnosis and I don't like to take medication."  She really has not had any neurology follow-up since that time.  She did have an appointment with me in September, 2015 but she canceled that.  02/22/15 update:  The patient is following up today as she will be having a levodopa challenge test.  She is not on any levodopa at home.  Last visit, I did schedule a modified barium swallow test.  This was done earlier today, and it was normal.  She was found to be mildly B12 deficient at our last visit (221) and I asked her to start a oral B12 supplement.  Physical therapy will start on [name] 16.  04/25/14 update:  The patient is following up today.  I have reviewed records from other physicians made available to me.  She has a history of vascular parkinsonism, with no benefit from levodopa.  She has been attending physical therapy for gait and balance since our last visit.  She completed her last session today and is  planning to start [facility] today.  She presents today primarily to talk about memory change.  Reports that sometimes she forgets to take her symbicort twice a day.  She has no problem driving and getting from one place to another.  She has no problems cooking/remembering to turn off stove.  She is having trouble balancing her checkbook and her daughter has been doing that.  She has an appt with the bank on Monday to help her straighten that out.  Her MRI of the brain does demonstrate relatively severe cerebral small vessel disease.  Since our last visit she has been treated for depression by her primary care physician.  She had been on sertraline for years but she did not think it was helping enough and Wellbutrin was initially added.  She felt that caused side effects and only wanted to be on one medication so the sertraline and Wellbutrin were discontinued and she was changed to Prozac.  She did not like that and ultimately went back to the sertraline.  She states that she is feeling much better now that she is back on sertraline and states that she was almost near suicidal when she was on the other medications.    PREVIOUS MEDICATIONS: Sinemet  ALLERGIES:   Allergies  Allergen Reactions  . Adhesive [Tape] Other (See Comments)    Caused blisters  . Amoxicillin Other (See Comments)  Caused thrush  . Ciprofloxacin Other (See Comments)    Caused thrush  . Ultram [Tramadol] Other (See Comments)    hypotension    CURRENT MEDICATIONS:  Outpatient Encounter Prescriptions as of 04/26/2015  Medication Sig  . albuterol (VENTOLIN HFA) 108 (90 BASE) MCG/ACT inhaler Inhale 2 puffs into the lungs 2 (two) times daily as needed for wheezing or shortness of breath.  Marland Kitchen apixaban (ELIQUIS) 5 MG TABS tablet Take 5 mg by mouth 2 (two) times daily.  . Ascorbic Acid (VITAMIN C) 1000 MG tablet Take 1,000 mg by mouth daily.  Marland Kitchen azelastine (ASTELIN) 0.1 % nasal spray Place 2 sprays into both nostrils at bedtime as  needed for rhinitis. Use in each nostril as directed (Patient not taking: Reported on 04/20/2015)  . budesonide-formoterol (SYMBICORT) 160-4.5 MCG/ACT inhaler Inhale 2 puffs into the lungs 2 (two) times daily.  . Calcium Carb-Cholecalciferol (CALCIUM 1000 + D PO) Take 1,000 mg by mouth daily.  . celecoxib (CELEBREX) 100 MG capsule Take 100 mg by mouth daily as needed (back pain).  Marland Kitchen diltiazem (CARDIZEM) 30 MG tablet Take 1 tablet (30 mg total) by mouth 4 (four) times daily. Take at onset on atrial fibrillation  . flecainide (TAMBOCOR) 100 MG tablet Take 50 mg by mouth daily as needed.  . sertraline (ZOLOFT) 100 MG tablet Take 100 mg by mouth daily.  Marland Kitchen trimethoprim (TRIMPEX) 100 MG tablet Take 100 mg by mouth daily.  . vitamin B-12 (CYANOCOBALAMIN) 1000 MCG tablet Take 1,000 mcg by mouth daily.  Marland Kitchen zoster vaccine live, PF, (ZOSTAVAX) 92119 UNT/0.65ML injection Inject 19,400 Units into the skin once.   No facility-administered encounter medications on file as of 04/26/2015.    PAST MEDICAL HISTORY:   Past Medical History  Diagnosis Date  . Arthritis   . Hyperlipidemia   . 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 Chronic constipation   . Hemorrhoid   . Varicose veins   . Anxiety   . Depression   . GERD (gastroesophageal reflux disease)   . Atrial fibrillation ([place])   . Recurrent UTI   . Parkinson disease ([place])   . Cardiac arrhythmia due to congenital heart disease   . Mycotic toenails 10/27/2012  . COPD (chronic obstructive pulmonary disease) ([place])   . Parkinsonism ([place])     PAST SURGICAL HISTORY:   Past Surgical History  Procedure Laterality Date  . Appendectomy  82 years old  . Colon resection  2008    SOCIAL HISTORY:   Social History   Social History  . Marital Status: Widowed    Spouse Name: N/A  . Number of Children: 2  . Years of Education: N/A   Occupational History  . retired    Social History Main Topics  . Smoking status: Former Smoker -- 2.00 packs/day for 30  years    Types: Cigarettes    Quit date: 04/21/1980  . Smokeless tobacco: Never Used     Comment: onset age 74 -26, up to > 1ppd (almost 2 ppd)  . Alcohol Use: No  . Drug Use: No  . Sexual Activity: No   Other Topics Concern  . Not on file   Social History Narrative   Widowed, lives alone. 1 child in [place] and 1 in [place].   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    FAMILY HISTORY:   Family Status  Relation Status Death Age  . Brother Deceased     asthma  . Father Deceased     throat cancer  .  Daughter Alive   . Daughter Alive   . Sister Deceased     breast cancer  . Mother Deceased 19    arthritis     ROS:  A complete 10 system review of systems was obtained and was unremarkable apart from what is mentioned above.  PHYSICAL EXAMINATION:    VITALS:   Filed Vitals:   04/26/15 1253  BP: 132/70  Pulse: 80  Height: '5\' 10"'$  (1.778 m)  Weight: 157 lb (71.215 kg)    GEN:  The patient appears stated age and is in NAD. HEENT:  Normocephalic, atraumatic.  The mucous membranes are moist. The superficial temporal arteries are without ropiness or tenderness. CV:  RRR Lungs:  CTAB Neck/HEME:  There are no carotid bruits bilaterally.  Neurological examination:  Orientation:  Montreal Cognitive Assessment  04/26/2015  Visuospatial/ Executive (0/5) 3  Naming (0/3) 3  Attention: Read list of digits (0/2) 2  Attention: Read list of letters (0/1) 1  Attention: Serial 7 subtraction starting at 100 (0/3) 1  Language: Repeat phrase (0/2) 2  Language : Fluency (0/1) 0  Abstraction (0/2) 2  Delayed Recall (0/5) 0  Orientation (0/6) 5  Total 19  Adjusted Score (based on education) 20    Cranial nerves: There is good facial symmetry.  There is no significant facial hypomimia.   The visual fields are full to confrontational testing. The speech is fluent and clear.  She has not particularly hypophonic.  She has no difficulty with guttural sounds.  Soft palate rises symmetrically and there  is no tongue deviation. Hearing is intact to conversational tone. Sensation: Sensation is intact to light and pinprick throughout (facial, trunk, extremities). Vibration is markedly decreased in a distal fashion. There is no extinction with double simultaneous stimulation. There is no sensory dermatomal level identified. Motor: Strength is 5/5 in the bilateral upper and lower extremities.   Shoulder shrug is equal and symmetric.  There is no pronator drift.   Movement examination: Tone: It is very difficult to assess the patient's tone due to gegenhalten but appears to be normal  Abnormal movements: The patient does have a very minimal tremor at rest on the right that is irregular Coordination:  There is no significant decremation with RAM's, with any form of RAMS, including alternating supination and pronation of the forearm, hand opening and closing, finger taps, heel taps and toe taps. Gait and Station: The patient has no difficulty arising out of a deep-seated chair without the use of the hands. The patient's stride length is normal today with a stooped posture.  The patient has a negative pull test.         Labs:  Lab Results  Component Value Date   VITAMINB12 221 02/12/2015   Lab Results  Component Value Date   HGBA1C 5.7 02/12/2015     ASSESSMENT/PLAN:  1.  Parkinsonism  -While the patient does have some evidence of parkinsonism, it is likely not idiopathic Parkinson's disease, but rather vascular parkinsonism.  An MRI of the brain from August, 2015 was reviewed today and there was a significant amount of cerebral small vessel disease.  UPDRS motor on/off test on 02/22/15 showed only minimal improvement with levodopa and no improvement in walking, which is where she has most of her issues.   -Is going to [facility] at [facility] on Monday and encouraged her to try personal training there and gave her financial resources for this. 2.  Dysphagia  -MBE done on 02/22/15 was  normal 3.   Gait instability  -I think that this is multifactorial.  While this can because by parkinsonism, she has evidence of very significant peripheral neuropathy.  She was mildly deficient in B12 and is now taking a supplement. 4.  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  May have mild degree of vascular dementia as well.  Will send for neuropsych testing.  -discussed assisted living which she is looking into  -discussed alarms for medication reminders 5.  I will see her back after above completed

## 2015-04-26 NOTE — Patient Instructions (Addendum)
1.  Ask them at the [facility] about [employer] funding for training 2.  We will set you up for the neuropsych testing with Dr. Valentina Shaggy at the neurorehab center. They will call you directly to schedule an appointment.  Please call [phone] if you do not hear from them.

## 2015-04-26 NOTE — Therapy (Signed)
[facility] [address] [place], [place], [address] Phone: [phone]   Fax:  [phone]  Physical Therapy Treatment  Patient Details  Name: [name] MRN: [id number] Date of Birth: [birth year] Referring Provider: Alonza Bogus, DO  Encounter Date: 04/26/2015      PT End of Session - 04/26/15 1209    Visit Number 9   Authorization Type Medicare-Gcode every 10th visit; BCBS secondary   PT Start Time 0935   PT Stop Time 1017   PT Time Calculation (min) 42 min   Activity Tolerance Patient tolerated treatment well   Behavior During Therapy [facility] for tasks assessed/performed      Past Medical History  Diagnosis Date  . Arthritis   . Hyperlipidemia   . 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 Chronic constipation   . Hemorrhoid   . Varicose veins   . Anxiety   . Depression   . GERD (gastroesophageal reflux disease)   . Atrial fibrillation ([place])   . Recurrent UTI   . Parkinson disease ([place])   . Cardiac arrhythmia due to congenital heart disease   . Mycotic toenails 10/27/2012  . COPD (chronic obstructive pulmonary disease) ([place])   . Parkinsonism [facility])     Past Surgical History  Procedure Laterality Date  . Appendectomy  82 years old  . Colon resection  2008    There were no vitals filed for this visit.  Visit Diagnosis:  Abnormality of gait  Postural instability      Subjective Assessment - 04/26/15 0937    Subjective Pt questions using rollator for long distances.   Currently in Pain? Yes   Pain Score 3    Pain Location Back   Pain Orientation Lower   Pain Type Chronic pain   Pain Onset More than a month ago   Pain Frequency Intermittent   Aggravating Factors  unsure   Pain Relieving Factors unsure            [facility] PT Assessment - 04/26/15 0943    Timed Up and Go Test   Normal TUG (seconds) 14.11  13.01 sec without cane   Manual TUG (seconds) 13.65   Cognitive TUG (seconds)  12.11  no cane   Functional Gait  Assessment   Gait assessed  Yes   Gait Level Surface Walks 20 ft in less than 7 sec but greater than 5.5 sec, uses assistive device, slower speed, mild gait deviations, or deviates 6-10 in outside of the 12 in walkway width.  6.4   Change in Gait Speed Able to change speed, demonstrates mild gait deviations, deviates 6-10 in outside of the 12 in walkway width, or no gait deviations, unable to achieve a major change in velocity, or uses a change in velocity, or uses an assistive device.   Gait with Horizontal Head Turns Performs head turns smoothly with slight change in gait velocity (eg, minor disruption to smooth gait path), deviates 6-10 in outside 12 in walkway width, or uses an assistive device.   Gait with Vertical Head Turns Performs task with slight change in gait velocity (eg, minor disruption to smooth gait path), deviates 6 - 10 in outside 12 in walkway width or uses assistive device   Gait and Pivot Turn Pivot turns safely within 3 sec and stops quickly with no loss of balance.   Step Over Obstacle Is able to step over one shoe box (4.5 in total height) but must slow down and adjust steps  to clear box safely. May require verbal cueing.   Gait with Narrow Base of Support Ambulates less than 4 steps heel to toe or cannot perform without assistance.   Gait with Eyes Closed Walks 20 ft, slow speed, abnormal gait pattern, evidence for imbalance, deviates 10-15 in outside 12 in walkway width. Requires more than 9 sec to ambulate 20 ft.  10.20 sec veers to R   Ambulating Backwards Walks 20 ft, slow speed, abnormal gait pattern, evidence for imbalance, deviates 10-15 in outside 12 in walkway width.   Steps Alternating feet, must use rail.   Total Score 16   FGA comment: Improved from 13/30; scores <22/30 indicate increased fall risk in community dwelling older adults.                     [facility] PT Treatment/Exercise - 04/26/15 0943     Transfers   Transfers Sit to Stand;Stand to Sit   Sit to Stand 7: Independent   Stand to Sit 7: Independent   Ambulation/Gait   Ambulation/Gait Yes   Ambulation/Gait Assistance 6: Modified independent (Device/Increase time)   Ambulation/Gait Assistance Details Cues provided to slow gait pace   Assistive device Straight cane;None   Gait Pattern Step-through pattern;Decreased arm swing - left;Decreased step length - right;Decreased step length - left;Narrow base of support   Ambulation Surface Level;Indoor   Gait velocity 14.92 sec= 2.2 ft/sec; 9.47 sec no cane (3.46 ft/sec no cane)  improved cane sequence with slowed pace   Gait Comments Discussed gait safety, options for using cane or rollator depending on distance of walking   High Level Balance   High Level Balance Comments Pt requests to review several exercises from HEP:  sidestepping, backwards walking at counter, standing [employer] with cues for 1UE support at [employer], then [employer]! twist with support at counter                PT Education - 04/26/15 1207    Education provided Yes   Education Details Progress towards goals, plans for discharge; review of fall prevention and community fitness; return eval in 6 months-pt is agreeable   Person(s) Educated Patient   Methods Explanation   Comprehension Verbalized understanding          PT Short Term Goals - 04/26/15 1209    PT SHORT TERM GOAL #1   Title Pt will perform HEP independently, to address Parkinson's specific deficits, for improved functional mobility.  TARGET 04/06/15   Time 4   Period Weeks   Status Partially Met   PT SHORT TERM GOAL #2   Title Pt will perform sit<>stand transfers from 18 inches or below, at least 8 of 10 trials without UE support, for improved transfer efficiency, safety and functional strength.   Time 4   Period Weeks   Status Achieved   PT SHORT TERM GOAL #3   Title Pt will improve TUG score to less than or equal to 13.5 seconds for  improved turns with gait and decreased fall risk.   Baseline 14.07 sec (improved from 14.77 sec)-04/06/15   Time 4   Period Weeks   Status Not Met   PT SHORT TERM GOAL #4   Title Pt will improve Functional Gait Assessment to at least 16/30 for decreased fall risk.   Time 4   Period Weeks   Status Not Met   PT SHORT TERM GOAL #5   Title Pt will verbalize understanding of local Parkinson's disease resources.  Baseline Discussed Power over Parkinson's community group May 02, 2015   Time 4   Period Weeks   Status Achieved           PT Long Term Goals - 05/02/15 1210    PT LONG TERM GOAL #1   Title Pt will verbalize understanding of fall prevention within the home environment.  TARGET 05/06/15   Time 8   Period Weeks   Status Achieved   PT LONG TERM GOAL #2   Title Pt will improve gait velocity to at least 3 ft/sec for improved safety and efficiency with gait.   Time 8   Period Weeks   Status Partially Met   PT LONG TERM GOAL #3   Title Pt will improve TUG manual score to less than or equal to 14.5 seconds for improved dual tasking with gait and decreased fall risk.   Time 8   Period Weeks   Status Achieved   PT LONG TERM GOAL #4   Title Pt will improve Functional Gait Assessment to at least 20/30 for decreased fall risk.   Time 8   Period Weeks   Status Not Met   PT LONG TERM GOAL #5   Title Pt will verbalize understanding of optimal community and ongoing fitness activities upon D/C from PT.   Time 8   Period Weeks   Status Achieved               Plan - May 02, 2015 1212    Clinical Impression Statement Pt feels comfortable with HEP and progression to [facility] beginning next week.  Goals checked today, with pt meeting STG #5, LTG #1,3, 5.  LTG #2 partially met.  LTG #4 not met.  Pt has progressed with functional mobility, still needing occasional cues for slowed pacing with gait.  Pt is appropriate for discharge from PT at this time.   Pt will benefit from skilled therapeutic  intervention in order to improve on the following deficits Abnormal gait;Decreased balance;Decreased mobility;Decreased strength;Decreased coordination;Difficulty walking;Postural dysfunction   Rehab Potential Good   PT Frequency 2x / week   PT Duration 8 weeks  plus eval   PT Treatment/Interventions ADLs/Self Care Home Management;Therapeutic exercise;Therapeutic activities;Functional mobility training;Gait training;Balance training;Neuromuscular re-education;Patient/family education   PT Next Visit Plan Discharge this visit.     Recommended Other Services Due to progressive nature of disease, pt would benefit from physical therapy evaluation in 6 months to assess functional mobility.  Pt in agreement.   Consulted and Agree with Plan of Care Patient          G-Codes - 05/02/2015 1215    Functional Assessment Tool Used Functional Gait Assessment 16/30, TUG 13.01 sec, TUG manual 13.65 sec no cane; gait velocity 3.46 ft/sec no cane   Functional Limitation Mobility: Walking and moving around   Mobility: Walking and Moving Around Goal Status [phone]) At least 20 percent but less than 40 percent impaired, limited or restricted   Mobility: Walking and Moving Around Discharge Status [phone]) At least 20 percent but less than 40 percent impaired, limited or restricted      Problem List Patient Active Problem List   Diagnosis Date Noted  . PCP NOTES >>>> 03/02/2015  . Atrial fibrillation with RVR (HCC) 01/31/2015  . Elevated troponin 01/31/2015  . Cough with sputum 01/31/2015  . COPD (chronic obstructive pulmonary disease) (HCC) 01/31/2015  . Lower abdominal pain 12/05/2014  . Acute bacterial bronchitis 10/04/2014  . Other fatigue 06/21/2014  . Lightheadedness 06/12/2014  . Concussion  11/21/2013  . Fall from other slipping, tripping, or stumbling 11/17/2013  . Concussion without loss of consciousness 11/17/2013  . COPD exacerbation ([place]) 09/26/2013  . Sinusitis, acute maxillary 07/19/2012  .  Musculoskeletal pain 05/21/2012  . IBS (irritable bowel syndrome) 04/02/2012  . Nail fungus 03/01/2012  . Ear bleeding 12/10/2011  . Generalized anxiety disorder 07/10/2011  . Parkinson's disease ([place]) 02/03/2011  . Shoulder pain 02/03/2011  . Recurrent UTI 11/01/2010  . GERD (gastroesophageal reflux disease) 11/01/2010  . Memory loss 11/01/2010  . COPD, mild ([place]) 09/23/2010  . Hip pain, right 09/23/2010  . COMPRESSION FRACTURE, LUMBAR VERTEBRAE 03/28/2010  . Abdominal pain 03/27/2010  . LACERATION, SCALP 03/27/2010  . Atrial fibrillation ([place]) 03/22/2010  . DIZZINESS 03/22/2010  . Mycotic toenails 10/02/2009  . DIARRHEA-PRESUMED INFECTIOUS 04/02/2009  . DYSPNEA ON EXERTION 01/19/2009  . HEMORRHOIDS-INTERNAL 02/14/2008  . CONSTIPATION 02/14/2008  . PERSONAL HX COLONIC POLYPS 02/14/2008    Jocelin Schuelke W. 04/26/2015, 12:17 PM  Frazier Butt., PT  [facility] [address] [place], [place], [address] Phone: [phone]   Fax:  [phone]  Name: [name] MRN: [id number] Date of Birth: 1927/11/03    PHYSICAL THERAPY DISCHARGE SUMMARY  Visits from Start of Care: 9  Current functional level related to goals / functional outcomes: See goals checked above   Remaining deficits: Balance, gait, endurance (all improving)   Education / Equipment: Pt educated in HEP, fall prevention, progression to community fitness.  Plan: Patient agrees to discharge.  Patient goals were partially met. Patient is being discharged due to being pleased with the current functional level.  ?????Pt is planning to transition to [facility] next week for continued community fitness.  Pt is in agreement with PT evaluation in 6+ months to assess functional mobility/update to HEP if needed due to progressive nature of disease.           Frazier Butt., PT  Shyonna Carlin Gerrit Friends, PT 04/26/2015 12:20 PM Phone: [phone] Fax:  [phone]

## 2015-04-26 NOTE — Patient Instructions (Addendum)
Comfort Level with your walking:  -For short, household distances or familiar spaces, you can go without your cane in order to take longer steps and improved arm swing (If you are having a day where your balance feels off or if you are particularly tired, PLEASE use your cane)  -For more intermediate distances (going in and out of doctor's offices, etc), use your cane for safety  -For longer distance walking, or walking outdoors for exercise, please use your rollator for safety and for improved endurance

## 2015-04-27 ENCOUNTER — Telehealth: Payer: Self-pay | Admitting: Internal Medicine

## 2015-04-27 NOTE — Telephone Encounter (Signed)
Pt left VM that she was supposed to schedule a 3 week med f/u on sertraline. I called her back and she said she was on it before and doing good. She will keep next scheduled appt 06/03/14. Call her if needed.

## 2015-04-27 NOTE — Telephone Encounter (Signed)
Noted  

## 2015-05-15 ENCOUNTER — Encounter: Payer: Self-pay | Admitting: Podiatry

## 2015-05-15 ENCOUNTER — Ambulatory Visit (INDEPENDENT_AMBULATORY_CARE_PROVIDER_SITE_OTHER): Payer: Medicare Other | Admitting: Podiatry

## 2015-05-15 DIAGNOSIS — M79676 Pain in unspecified toe(s): Secondary | ICD-10-CM

## 2015-05-15 DIAGNOSIS — B351 Tinea unguium: Secondary | ICD-10-CM

## 2015-05-15 NOTE — Progress Notes (Signed)
Patient ID: [name], female   DOB: [birth year], 82 y.o.   MRN: [id number] Complaint:  Visit Type: Patient returns to my office for continued preventative foot care services. Complaint: Patient states" my nails have grown long and thick and become painful to walk and wear shoes" . She presents for preventative foot care services. No changes to ROS.  She says she has been hospitalized with atrial fib and asthma.  Podiatric Exam: Vascular: dorsalis pedis and posterior tibial pulses are palpable bilateral. Capillary return is immediate. Temperature gradient is WNL. Skin turgor WNL  Sensorium: Normal Semmes Weinstein monofilament test. Normal tactile sensation bilaterally. Nail Exam: Pt has thick disfigured discolored nails with subungual debris noted bilateral entire nail hallux through fifth toenails Ulcer Exam: There is no evidence of ulcer or pre-ulcerative changes or infection. Orthopedic Exam: Muscle tone and strength are WNL. No limitations in general ROM. No crepitus or effusions noted. Foot type and digits show no abnormalities. Bony prominences are unremarkable. Skin: No Porokeratosis. No infection or ulcers  Diagnosis:  Tinea unguium, Pain in right toe, pain in left toes  Treatment & Plan Procedures and Treatment: Consent by patient was obtained for treatment procedures. The patient understood the discussion of treatment and procedures well. All questions were answered thoroughly reviewed. Debridement of mycotic and hypertrophic toenails, 1 through 5 bilateral and clearing of subungual debris. No ulceration, no infection noted.  Return Visit-Office Procedure: Patient instructed to return to the office for a follow up visit 3 months for continued evaluation and treatment.   Gardiner Barefoot DPM

## 2015-05-16 ENCOUNTER — Telehealth: Payer: Self-pay | Admitting: Neurology

## 2015-05-16 NOTE — Telephone Encounter (Signed)
Appointment for Dr Valentina Shaggy is in 07/31/2015/Dawn CB# [phone]

## 2015-05-25 ENCOUNTER — Telehealth: Payer: Self-pay | Admitting: Internal Medicine

## 2015-05-25 NOTE — Telephone Encounter (Signed)
Notified pt. 

## 2015-05-25 NOTE — Telephone Encounter (Signed)
Lower back and pubic area hurts. Also frequency with urination. No burning. She believes she has a uti. Pt would like to come up for urine test today. Please advise.

## 2015-05-25 NOTE — Telephone Encounter (Signed)
With it being a Friday, we would not have results back until Monday, if she is having symptoms recommend to go to Urgent Care.

## 2015-05-26 ENCOUNTER — Ambulatory Visit (INDEPENDENT_AMBULATORY_CARE_PROVIDER_SITE_OTHER): Payer: Medicare Other | Admitting: Physician Assistant

## 2015-05-26 VITALS — BP 106/62 | HR 74 | Temp 97.0°F | Resp 18 | Ht 70.5 in | Wt 156.8 lb

## 2015-05-26 DIAGNOSIS — R109 Unspecified abdominal pain: Secondary | ICD-10-CM | POA: Diagnosis not present

## 2015-05-26 DIAGNOSIS — R350 Frequency of micturition: Secondary | ICD-10-CM | POA: Diagnosis not present

## 2015-05-26 DIAGNOSIS — N898 Other specified noninflammatory disorders of vagina: Secondary | ICD-10-CM

## 2015-05-26 DIAGNOSIS — R1030 Lower abdominal pain, unspecified: Secondary | ICD-10-CM

## 2015-05-26 LAB — COMPREHENSIVE METABOLIC PANEL
ALBUMIN: 4.1 g/dL (ref 3.6–5.1)
ALT: 13 U/L (ref 6–29)
AST: 21 U/L (ref 10–35)
Alkaline Phosphatase: 74 U/L (ref 33–130)
BILIRUBIN TOTAL: 0.4 mg/dL (ref 0.2–1.2)
BUN: 12 mg/dL (ref 7–25)
CO2: 27 mmol/L (ref 20–31)
CREATININE: 0.89 mg/dL — AB (ref 0.60–0.88)
Calcium: 8.9 mg/dL (ref 8.6–10.4)
Chloride: 99 mmol/L (ref 98–110)
Glucose, Bld: 76 mg/dL (ref 65–99)
Potassium: 4.3 mmol/L (ref 3.5–5.3)
SODIUM: 133 mmol/L — AB (ref 135–146)
TOTAL PROTEIN: 6.6 g/dL (ref 6.1–8.1)

## 2015-05-26 LAB — POCT CBC
Granulocyte percent: 69.3 %G (ref 37–80)
HEMATOCRIT: 37.3 % — AB (ref 37.7–47.9)
Hemoglobin: 13 g/dL (ref 12.2–16.2)
LYMPH, POC: 1.8 (ref 0.6–3.4)
MCH: 32.4 pg — AB (ref 27–31.2)
MCHC: 34.8 g/dL (ref 31.8–35.4)
MCV: 92.9 fL (ref 80–97)
MID (cbc): 0.3 (ref 0–0.9)
MPV: 7.7 fL (ref 0–99.8)
PLATELET COUNT, POC: 252 10*3/uL (ref 142–424)
POC Granulocyte: 4.7 (ref 2–6.9)
POC LYMPH %: 26.3 % (ref 10–50)
POC MID %: 4.4 %M (ref 0–12)
RBC: 4.01 M/uL — AB (ref 4.04–5.48)
RDW, POC: 13.8 %
WBC: 6.8 10*3/uL (ref 4.6–10.2)

## 2015-05-26 LAB — POCT URINALYSIS DIP (MANUAL ENTRY)
BILIRUBIN UA: NEGATIVE
Bilirubin, UA: NEGATIVE
Glucose, UA: NEGATIVE
Nitrite, UA: NEGATIVE
PROTEIN UA: NEGATIVE
RBC UA: NEGATIVE
SPEC GRAV UA: 1.01
UROBILINOGEN UA: 0.2
pH, UA: 5.5

## 2015-05-26 LAB — POCT WET + KOH PREP
Trich by wet prep: ABSENT
YEAST BY KOH: ABSENT
YEAST BY WET PREP: ABSENT

## 2015-05-26 LAB — POC MICROSCOPIC URINALYSIS (UMFC): MUCUS RE: ABSENT

## 2015-05-26 NOTE — Progress Notes (Signed)
[facility] [address], [place] [address] [phone]  Date:  05/26/2015   Name:  [name]   DOB:  1927/05/16   MRN:  [id number]  PCP:  Kathlene November, MD    Chief Complaint: Dysuria; Back Pain; and Abdominal Pain   History of Present Illness:  This is a 82 y.o. female with PMH COPD, A fib, parkinsons, recurrent UTI, IBS who is presenting with urinary frequency, lower abdominal pain and low back pain x 1 week. No dysuria, states she never gets dysuria. This feels identical to past UTIs. No fever, chills, malaise. + fatigue. Not going to the gym like she usually does this past week. Has a hx recurrent UTI. [facility]. They prescribe bactrim 100 mg QD for prevention. Last UTI -- 6 months ago. Lives by herself. She's a widow. She has noticed some intermittent malodorous vaginal discharge. She is not sure if the bad odor is coming from the discharge or her urine. She states she plans to make appt with GYN soon. She does have hx IBS, diarrhea type. She does not take any medication for this.  Review of Systems:  Review of Systems See HPI  Patient Active Problem List   Diagnosis Date Noted  . Atrial fibrillation with RVR ([place]) 01/31/2015  . Elevated troponin 01/31/2015  . COPD (chronic obstructive pulmonary disease) ([place]) 01/31/2015  . Lower abdominal pain 12/05/2014  . Other fatigue 06/21/2014  . Lightheadedness 06/12/2014  . Fall from other slipping, tripping, or stumbling 11/17/2013  . Concussion without loss of consciousness 11/17/2013  . COPD exacerbation ([place]) 09/26/2013  . Sinusitis, acute maxillary 07/19/2012  . Musculoskeletal pain 05/21/2012  . IBS (irritable bowel syndrome) 04/02/2012  . Ear bleeding 12/10/2011  . Generalized anxiety disorder 07/10/2011  . Parkinson's disease ([place]) 02/03/2011  . Shoulder pain 02/03/2011  . Recurrent UTI 11/01/2010  . GERD (gastroesophageal reflux disease) 11/01/2010  . Memory loss  11/01/2010  . Hip pain, right 09/23/2010  . COMPRESSION FRACTURE, LUMBAR VERTEBRAE 03/28/2010  . Abdominal pain 03/27/2010  . DIZZINESS 03/22/2010  . Mycotic toenails 10/02/2009  . DIARRHEA-PRESUMED INFECTIOUS 04/02/2009  . DYSPNEA ON EXERTION 01/19/2009  . HEMORRHOIDS-INTERNAL 02/14/2008  . CONSTIPATION 02/14/2008  . PERSONAL HX COLONIC POLYPS 02/14/2008    Prior to Admission medications   Medication Sig Start Date End Date Taking? Authorizing Provider  albuterol (VENTOLIN HFA) 108 (90 BASE) MCG/ACT inhaler Inhale 2 puffs into the lungs 2 (two) times daily as needed for wheezing or shortness of breath. 02/05/15  Yes Midge Minium, MD  apixaban (ELIQUIS) 5 MG TABS tablet Take 5 mg by mouth 2 (two) times daily.   Yes Historical Provider, MD  Ascorbic Acid (VITAMIN C) 1000 MG tablet Take 1,000 mg by mouth daily.   Yes Historical Provider, MD  azelastine (ASTELIN) 0.1 % nasal spray Place 2 sprays into both nostrils at bedtime as needed for rhinitis. Use in each nostril as directed 04/11/15  Yes Colon Branch, MD  budesonide-formoterol [facility]) 160-4.5 MCG/ACT inhaler Inhale 2 puffs into the lungs 2 (two) times daily. 10/09/14  Yes Brunetta Jeans, PA-C  Calcium Carb-Cholecalciferol (CALCIUM 1000 + D PO) Take 1,000 mg by mouth daily.   Yes Historical Provider, MD  diltiazem (CARDIZEM) 30 MG tablet Take 1 tablet (30 mg total) by mouth 4 (four) times daily. Take at onset on atrial fibrillation 02/02/15  Yes Orson Eva, MD  sertraline (ZOLOFT) 100 MG tablet Take 100 mg  by mouth daily. Reported on 05/26/2015   Yes Historical Provider, MD  trimethoprim (TRIMPEX) 100 MG tablet Take 100 mg by mouth daily.   Yes Historical Provider, MD  vitamin B-12 (CYANOCOBALAMIN) 1000 MCG tablet Take 1,000 mcg by mouth daily.   Yes Historical Provider, MD       Historical Provider, MD       Historical Provider, MD    Allergies  Allergen Reactions  . Adhesive [Tape] Other (See Comments)    Caused blisters  .  Amoxicillin Other (See Comments)    Caused thrush  . Ciprofloxacin Other (See Comments)    Caused thrush  . Ultram [Tramadol] Other (See Comments)    hypotension    Past Surgical History  Procedure Laterality Date  . Appendectomy  82 years old  . Colon resection  2008    Social History  Substance Use Topics  . Smoking status: Former Smoker -- 2.00 packs/day for 30 years    Types: Cigarettes    Quit date: 04/21/1980  . Smokeless tobacco: Never Used     Comment: onset age 82 -12, up to > 1ppd (almost 2 ppd)  . Alcohol Use: No    Family History  Problem Relation Age of Onset  . Asthma Brother   . Alcohol abuse Brother   . Throat cancer Father   . Alcohol abuse Father   . Lupus Daughter   . Bipolar disorder Daughter   . Lupus Daughter   . Anxiety disorder Daughter   . Alcohol abuse Sister   . Alcohol abuse Maternal Grandfather   . Alcohol abuse Paternal Grandmother     Medication list has been reviewed and updated.  Physical Examination:  Physical Exam  Constitutional: She is oriented to person, place, and time. She appears well-developed and well-nourished. No distress.  HENT:  Head: Normocephalic and atraumatic.  Right Ear: Hearing normal.  Left Ear: Hearing normal.  Nose: Nose normal.  Eyes: Conjunctivae and lids are normal. Right eye exhibits no discharge. Left eye exhibits no discharge. No scleral icterus.  Cardiovascular: Normal rate, regular rhythm, normal heart sounds and normal pulses.   No murmur heard. Pulmonary/Chest: Effort normal and breath sounds normal. No respiratory distress. She has no wheezes. She has no rhonchi. She has no rales.  Abdominal: Soft. Normal appearance. There is tenderness in the suprapubic area. There is no CVA tenderness.  Genitourinary: Uterus normal.    Right adnexum displays no tenderness and no fullness. Left adnexum displays no tenderness and no fullness. No tenderness in the vagina. No vaginal discharge found.  Cervix not  visualized or felt Several firm nodules on vulva, one on clitoris, see depiction. No ttp. Pt states has been there for several year and GYN has never mentioned anything about it  Musculoskeletal: Normal range of motion.  Lymphadenopathy:       Head (right side): No submental, no submandibular and no tonsillar adenopathy present.       Head (left side): No submental, no submandibular and no tonsillar adenopathy present.    She has no cervical adenopathy.  Neurological: She is alert and oriented to person, place, and time.  Skin: Skin is warm, dry and intact. No lesion and no rash noted.  Psychiatric: She has a normal mood and affect. Her speech is normal and behavior is normal. Thought content normal.    BP 106/62 mmHg  Pulse 74  Temp(Src) 97 F (36.1 C) (Axillary)  Resp 18  Ht 5' 10.5" (1.791 m)  Wt  156 lb 12.8 oz (71.124 kg)  BMI 22.17 kg/m2  SpO2 94%  Results for orders placed or performed in visit on 05/26/15  POCT urinalysis dipstick  Result Value Ref Range   Color, UA yellow yellow   Clarity, UA clear clear   Glucose, UA negative negative   Bilirubin, UA negative negative   Ketones, POC UA negative negative   Spec Grav, UA 1.010    Blood, UA negative negative   pH, UA 5.5    Protein Ur, POC negative negative   Urobilinogen, UA 0.2    Nitrite, UA Negative Negative   Leukocytes, UA Trace (A) Negative  POCT Microscopic Urinalysis ([facility])  Result Value Ref Range   WBC,UR,HPF,POC Few (A) None WBC/hpf   RBC,UR,HPF,POC None None RBC/hpf   Bacteria Few (A) None, Too numerous to count   Mucus Absent Absent   Epithelial Cells, UR Per Microscopy Few (A) None, Too numerous to count cells/hpf  POCT CBC  Result Value Ref Range   WBC 6.8 4.6 - 10.2 K/uL   Lymph, poc 1.8 0.6 - 3.4   POC LYMPH PERCENT 26.3 10 - 50 %L   MID (cbc) 0.3 0 - 0.9   POC MID % 4.4 0 - 12 %M   POC Granulocyte 4.7 2 - 6.9   Granulocyte percent 69.3 37 - 80 %G   RBC 4.01 (A) 4.04 - 5.48 M/uL    Hemoglobin 13.0 12.2 - 16.2 g/dL   HCT, POC 37.3 (A) 37.7 - 47.9 %   MCV 92.9 80 - 97 fL   MCH, POC 32.4 (A) 27 - 31.2 pg   MCHC 34.8 31.8 - 35.4 g/dL   RDW, POC 13.8 %   Platelet Count, POC 252 142 - 424 K/uL   MPV 7.7 0 - 99.8 fL  POCT Wet + KOH Prep  Result Value Ref Range   Yeast by KOH Absent Present, Absent   Yeast by wet prep Absent Present, Absent   WBC by wet prep None None, Few, Too numerous to count   Clue Cells Wet Prep HPF POC None None, Too numerous to count   Trich by wet prep Absent Present, Absent   Bacteria Wet Prep HPF POC None None, Few, Too numerous to count   Epithelial Cells By [employer] Pref ([facility]) Few None, Few, Too numerous to count   RBC,UR,HPF,POC None None RBC/hpf   Assessment and Plan:  1. Lower abdominal pain 2. Urinary frequency 3. Vaginal discharge All labs normal - UA, CBC, wet prep. Possible that abdominal pain d/t IBS. Discussed dietary modifications and hydration. She has upcoming appt with PCP. She will make appt with [facility] if symptoms not improving by next week. Continue daily bactrim for UTI prevention. - POCT urinalysis dipstick - POCT Microscopic Urinalysis ([facility]) - POCT CBC - Comprehensive metabolic panel - Urine culture - POCT Wet + KOH Prep   Benjaman Pott. Drenda Freeze, MHS [facility]  05/26/2015

## 2015-05-26 NOTE — Patient Instructions (Addendum)
Drink plenty of water (at least 64 oz per day). Try taking cranberry pills daily. I will call you with results from your urine culture. If you are still having problems on Monday, call [facility] and try to get an appointment.  Diet for Irritable Bowel Syndrome When you have irritable bowel syndrome (IBS), the foods you eat and your eating habits are very important. IBS may cause various symptoms, such as abdominal pain, constipation, or diarrhea. Choosing the right foods can help ease discomfort caused by these symptoms. Work with your health care provider and dietitian to find the best eating plan to help control your symptoms. WHAT GENERAL GUIDELINES DO I NEED TO FOLLOW?  Keep a food diary. This will help you identify foods that cause symptoms. Write down:  What you eat and when.  What symptoms you have.  When symptoms occur in relation to your meals.  Avoid foods that cause symptoms. Talk with your dietitian about other ways to get the same nutrients that are in these foods.  Eat more foods that contain fiber. Take a fiber supplement if directed by your dietitian.  Eat your meals slowly, in a relaxed setting.  Aim to eat 5-6 small meals per day. Do not skip meals.  Drink enough fluids to keep your urine clear or pale yellow.  Ask your health care provider if you should take an over-the-counter probiotic during flare-ups to help restore healthy gut bacteria.  If you have cramping or diarrhea, try making your meals low in fat and high in carbohydrates. Examples of carbohydrates are pasta, rice, whole grain breads and cereals, fruits, and vegetables.  If dairy products cause your symptoms to flare up, try eating less of them. You might be able to handle yogurt better than other dairy products because it contains bacteria that help with digestion. WHAT FOODS ARE NOT RECOMMENDED? The following are some foods and drinks that may worsen your symptoms:  Fatty foods, such as  [place] fries.  Milk products, such as cheese or ice cream.  Chocolate.  Alcohol.  Products with caffeine, such as coffee.  Carbonated drinks, such as soda. The items listed above may not be a complete list of foods and beverages to avoid. Contact your dietitian for more information. WHAT FOODS ARE GOOD SOURCES OF FIBER? Your health care provider or dietitian may recommend that you eat more foods that contain fiber. Fiber can help reduce constipation and other IBS symptoms. Add foods with fiber to your diet a little at a time so that your body can get used to them. Too much fiber at once might cause gas and swelling of your abdomen. The following are some foods that are good sources of fiber:  Apples.  Peaches.  Pears.  Berries.  Figs.  Broccoli (raw).  Cabbage.  Carrots.  Raw peas.  Kidney beans.  Lima beans.  Whole grain bread.  Whole grain cereal. FOR MORE INFORMATION  International Foundation for Functional Gastrointestinal Disorders: www.iffgd.[employer] of Diabetes and Digestive and Kidney Diseases: NetworkAffair.co.za.aspx   This information is not intended to replace advice given to you by your health care provider. Make sure you discuss any questions you have with your health care provider.   Document Released: 06/28/2003 Document Revised: 04/28/2014 Document Reviewed: 07/08/2013 Elsevier Interactive Patient Education [employer].

## 2015-05-27 LAB — URINE CULTURE: Colony Count: 6000

## 2015-06-04 ENCOUNTER — Ambulatory Visit (INDEPENDENT_AMBULATORY_CARE_PROVIDER_SITE_OTHER): Payer: Medicare Other | Admitting: Internal Medicine

## 2015-06-04 ENCOUNTER — Encounter: Payer: Self-pay | Admitting: Internal Medicine

## 2015-06-04 VITALS — BP 116/78 | HR 64 | Temp 97.9°F | Ht 71.0 in | Wt 158.0 lb

## 2015-06-04 DIAGNOSIS — Z09 Encounter for follow-up examination after completed treatment for conditions other than malignant neoplasm: Secondary | ICD-10-CM

## 2015-06-04 DIAGNOSIS — J438 Other emphysema: Secondary | ICD-10-CM | POA: Diagnosis not present

## 2015-06-04 DIAGNOSIS — F329 Major depressive disorder, single episode, unspecified: Secondary | ICD-10-CM

## 2015-06-04 DIAGNOSIS — F419 Anxiety disorder, unspecified: Principal | ICD-10-CM

## 2015-06-04 DIAGNOSIS — F418 Other specified anxiety disorders: Secondary | ICD-10-CM

## 2015-06-04 NOTE — Progress Notes (Signed)
Subjective:    Patient ID: [name], female    DOB: June 22, 1927, 82 y.o.   MRN: [id number]  DOS:  06/04/2015 Type of visit - description : rov  Interval history: Since the last office visit, saw neurology, note reviewed. Also went to urgent care 05/26/2015, had low back pain and a vaginal discharge. Workup was negative. She feels better, symptoms felt to be from IBS.    Review of Systems Currently feeling well. No fever chills No suicidal thoughts  Past Medical History  Diagnosis Date  . Arthritis   . Hyperlipidemia   . 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 Chronic constipation   . Hemorrhoid   . Varicose veins   . Anxiety   . Depression   . GERD (gastroesophageal reflux disease)   . Atrial fibrillation ([place])   . Recurrent UTI   . Parkinson disease ([place])   . Cardiac arrhythmia due to congenital heart disease   . Mycotic toenails 10/27/2012  . COPD (chronic obstructive pulmonary disease) ([place])   . Parkinsonism [facility])     Past Surgical History  Procedure Laterality Date  . Appendectomy  82 years old  . Colon resection  2008    Social History   Social History  . Marital Status: Widowed    Spouse Name: N/A  . Number of Children: 2  . Years of Education: N/A   Occupational History  . retired    Social History Main Topics  . Smoking status: Former Smoker -- 2.00 packs/day for 30 years    Types: Cigarettes    Quit date: 04/21/1980  . Smokeless tobacco: Never Used     Comment: onset age 20 -73, up to > 1ppd (almost 2 ppd)  . Alcohol Use: No  . Drug Use: No  . Sexual Activity: No   Other Topics Concern  . Not on file   Social History Narrative   Widowed, lives alone. 1 child in [place] and 1 in [place].   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        Medication List       This list is accurate as of: 06/04/15  1:04 PM.  Always use your most recent med list.               albuterol 108 (90 Base) MCG/ACT inhaler  Commonly known as:  VENTOLIN HFA    Inhale 2 puffs into the lungs 2 (two) times daily as needed for wheezing or shortness of breath.     apixaban 5 MG Tabs tablet  Commonly known as:  ELIQUIS  Take 5 mg by mouth 2 (two) times daily.     azelastine 0.1 % nasal spray  Commonly known as:  ASTELIN  Place 2 sprays into both nostrils at bedtime as needed for rhinitis. Use in each nostril as directed     budesonide-formoterol 160-4.5 MCG/ACT inhaler  Commonly known as:  SYMBICORT  Inhale 2 puffs into the lungs 2 (two) times daily.     CALCIUM 1000 + D PO  Take 1,000 mg by mouth daily.     celecoxib 100 MG capsule  Commonly known as:  CELEBREX  Take 100 mg by mouth daily as needed (back pain).     diltiazem 30 MG tablet  Commonly known as:  CARDIZEM  Take 1 tablet (30 mg total) by mouth 4 (four) times daily. Take at onset on atrial fibrillation     flecainide 100 MG tablet  Commonly known as:  TAMBOCOR  Take 50 mg by mouth daily as needed. Reported on 05/26/2015     sertraline 100 MG tablet  Commonly known as:  ZOLOFT  Take 100 mg by mouth daily. Reported on 05/26/2015     trimethoprim 100 MG tablet  Commonly known as:  TRIMPEX  Take 100 mg by mouth daily.     vitamin B-12 1000 MCG tablet  Commonly known as:  CYANOCOBALAMIN  Take 1,000 mcg by mouth daily.     vitamin C 1000 MG tablet  Take 1,000 mg by mouth daily.           Objective:   Physical Exam BP 116/78 mmHg  Pulse 64  Temp(Src) 97.9 F (36.6 C) (Oral)  Ht '5\' 11"'$  (1.803 m)  Wt 158 lb (71.668 kg)  BMI 22.05 kg/m2  SpO2 96% General:   Well developed, well nourished . NAD.  HEENT:  Normocephalic . Face symmetric, atraumatic Lungs:  CTA B Normal respiratory effort, no intercostal retractions, no accessory muscle use. Heart: RRR,  no murmur.  No pretibial edema bilaterally  Skin: Not pale. Not jaundice Neurologic:  alert & oriented X3.  Speech normal, gait appropriate for age and unassisted Psych--  Cognition and judgment appear intact.   Cooperative with normal attention span and concentration.  Behavior appropriate. Slightly apprehensive but no depressed  appearing.      Assessment & Plan:   Assessment > Hyperlipidemia Anxiety depression  Dr Ferdinand Lango (psychology), meds rx by pcp Memory impairment (see neuro note 04-2015)   COPD   P-Atrial fibrillation Dr Gerrit Friends, flecanaide prn /cardiazem prn GI: --GERD --Abnormal abdominal US mild pancreatic duct enlargement  --IBS used to be constipated, now diarrhea predominant --dysphagia MBE 02-22-15 wnl DJD--- take Celebrex very seldom Recurrent UTIs -- qd abx , has seenn urology Dr Edwena Blow before Parkinsonism ---  Dr Tat Gait d/o (multifactorial likely) Peripheral neuropathy (pr neuro note 04-2015) admitted  01-2015: COPD and RVR   PLAN   anxiety, depression: Back or sertraline, doing better. has been unable to see Dr. Ferdinand Lango, thinking about switching to one of our counselors. Counseled  COPD: Asymptomatic Labs reviewed, up-to-date on all needed labs. RTC 4 months, CPX  Today, I spent more than  16  min with the patient: >50% of the time counseling regards anxiety management

## 2015-06-04 NOTE — Progress Notes (Signed)
Pre visit review using our clinic review tool, if applicable. No additional management support is needed unless otherwise documented below in the visit note. 

## 2015-06-04 NOTE — Assessment & Plan Note (Addendum)
  anxiety, depression: Back or sertraline, doing better. has been unable to see Dr. Ferdinand Lango, thinking about switching to one of our counselors. Counseled  COPD: Asymptomatic Labs reviewed, up-to-date on all needed labs. RTC 4 months, CPX

## 2015-06-04 NOTE — Patient Instructions (Signed)
  GO TO THE FRONT DESK Schedule a complete physical exam to be done in 4 months, fasting

## 2015-06-13 ENCOUNTER — Telehealth: Payer: Self-pay | Admitting: Internal Medicine

## 2015-06-13 ENCOUNTER — Encounter: Payer: Self-pay | Admitting: Physician Assistant

## 2015-06-13 ENCOUNTER — Ambulatory Visit (INDEPENDENT_AMBULATORY_CARE_PROVIDER_SITE_OTHER): Payer: Medicare Other | Admitting: Physician Assistant

## 2015-06-13 VITALS — BP 90/58 | HR 83 | Temp 98.7°F | Ht 71.0 in | Wt 157.0 lb

## 2015-06-13 DIAGNOSIS — J111 Influenza due to unidentified influenza virus with other respiratory manifestations: Secondary | ICD-10-CM | POA: Diagnosis not present

## 2015-06-13 LAB — POCT INFLUENZA A/B
INFLUENZA A, POC: POSITIVE — AB
Influenza B, POC: NEGATIVE

## 2015-06-13 MED ORDER — PREDNISONE 20 MG PO TABS: 40.0000 mg | ORAL_TABLET | Freq: Every day | ORAL | Status: DC

## 2015-06-13 MED ORDER — OSELTAMIVIR PHOSPHATE 30 MG PO CAPS: 30.0000 mg | ORAL_CAPSULE | Freq: Two times a day (BID) | ORAL | Status: DC

## 2015-06-13 NOTE — Addendum Note (Signed)
Addended by: Harl Bowie on: 06/13/2015 11:14 AM   Modules accepted: Orders

## 2015-06-13 NOTE — Progress Notes (Signed)
Patient presents to clinic today c/o aches, productive cough and fatigue since Sunday. Endorses fever of 100 since onset, worse at night. Denies fever so far today. Patient with history of COPD. Is taking Symbicort and albuterol as directed. Denies recent travel or sick contact.  Past Medical History  Diagnosis Date  . Arthritis   . Hyperlipidemia   . 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 Chronic constipation   . Hemorrhoid   . Varicose veins   . Anxiety   . Depression   . GERD (gastroesophageal reflux disease)   . Atrial fibrillation ([place])   . Recurrent UTI   . Parkinson disease ([place])   . Cardiac arrhythmia due to congenital heart disease   . Mycotic toenails 10/27/2012  . COPD (chronic obstructive pulmonary disease) ([place])   . Parkinsonism [facility])     Current Outpatient Prescriptions on File Prior to Visit  Medication Sig Dispense Refill  . albuterol (VENTOLIN HFA) 108 (90 BASE) MCG/ACT inhaler Inhale 2 puffs into the lungs 2 (two) times daily as needed for wheezing or shortness of breath. 18 g 4  . apixaban (ELIQUIS) 5 MG TABS tablet Take 5 mg by mouth 2 (two) times daily.    . Ascorbic Acid (VITAMIN C) 1000 MG tablet Take 1,000 mg by mouth daily.    Marland Kitchen azelastine (ASTELIN) 0.1 % nasal spray Place 2 sprays into both nostrils at bedtime as needed for rhinitis. Use in each nostril as directed 30 mL 3  . budesonide-formoterol (SYMBICORT) 160-4.5 MCG/ACT inhaler Inhale 2 puffs into the lungs 2 (two) times daily. (Patient taking differently: Inhale 2 puffs into the lungs daily. ) 3 Inhaler 3  . Calcium Carb-Cholecalciferol (CALCIUM 1000 + D PO) Take 1,000 mg by mouth daily.    . celecoxib (CELEBREX) 100 MG capsule Take 100 mg by mouth daily as needed (back pain).    Marland Kitchen diltiazem (CARDIZEM) 30 MG tablet Take 1 tablet (30 mg total) by mouth 4 (four) times daily. Take at onset on atrial fibrillation 30 tablet 0  . flecainide (TAMBOCOR) 100 MG tablet Take 50 mg by mouth daily as needed.  Reported on 05/26/2015    . sertraline (ZOLOFT) 100 MG tablet Take 100 mg by mouth daily. Reported on 05/26/2015    . trimethoprim (TRIMPEX) 100 MG tablet Take 100 mg by mouth daily.    . vitamin B-12 (CYANOCOBALAMIN) 1000 MCG tablet Take 1,000 mcg by mouth daily.     No current facility-administered medications on file prior to visit.    Allergies  Allergen Reactions  . Adhesive [Tape] Other (See Comments)    Caused blisters  . Amoxicillin Other (See Comments)    Caused thrush  . Ciprofloxacin Other (See Comments)    Caused thrush  . Ultram [Tramadol] Other (See Comments)    hypotension    Family History  Problem Relation Age of Onset  . Asthma Brother   . Alcohol abuse Brother   . Throat cancer Father   . Alcohol abuse Father   . Lupus Daughter   . Bipolar disorder Daughter   . Lupus Daughter   . Anxiety disorder Daughter   . Alcohol abuse Sister   . Alcohol abuse Maternal Grandfather   . Alcohol abuse Paternal Grandmother     Social History   Social History  . Marital Status: Widowed    Spouse Name: N/A  . Number of Children: 2  . Years of Education: N/A   Occupational History  . retired  Social History Main Topics  . Smoking status: Former Smoker -- 2.00 packs/day for 30 years    Types: Cigarettes    Quit date: 04/21/1980  . Smokeless tobacco: Never Used     Comment: onset age 87 -76, up to > 1ppd (almost 2 ppd)  . Alcohol Use: No  . Drug Use: No  . Sexual Activity: No   Other Topics Concern  . None   Social History Narrative   Widowed, lives alone. 1 child in [place] and 1 in [place].   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   Review of Systems - See HPI.  All other ROS are negative.  BP 90/58 mmHg  Pulse 83  Temp(Src) 98.7 F (37.1 C) (Oral)  Ht '5\' 11"'$  (1.803 m)  Wt 157 lb (71.215 kg)  BMI 21.91 kg/m2  SpO2 96%  Physical Exam  Constitutional: She is oriented to person, place, and time and well-developed, well-nourished, and in no distress.  HENT:    Head: Normocephalic and atraumatic.  Right Ear: External ear normal.  Left Ear: External ear normal.  Nose: Nose normal.  Mouth/Throat: Oropharynx is clear and moist. No oropharyngeal exudate.  TM within normal limits bilaterally.  Eyes: Conjunctivae are normal.  Neck: Neck supple.  Cardiovascular: Normal rate, regular rhythm, normal heart sounds and intact distal pulses.   Pulmonary/Chest: Effort normal. No respiratory distress. She has wheezes. She has no rales. She exhibits no tenderness.  Neurological: She is alert and oriented to person, place, and time.  Skin: Skin is warm and dry. No rash noted.  Psychiatric: Affect normal.  Vitals reviewed.   Recent Results (from the past 2160 hour(s))  POCT urinalysis dipstick     Status: Abnormal   Collection Time: 05/26/15  9:16 AM  Result Value Ref Range   Color, UA yellow yellow   Clarity, UA clear clear   Glucose, UA negative negative   Bilirubin, UA negative negative   Ketones, POC UA negative negative   Spec Grav, UA 1.010    Blood, UA negative negative   pH, UA 5.5    Protein Ur, POC negative negative   Urobilinogen, UA 0.2    Nitrite, UA Negative Negative   Leukocytes, UA Trace (A) Negative  POCT Microscopic Urinalysis ([facility])     Status: Abnormal   Collection Time: 05/26/15  9:16 AM  Result Value Ref Range   WBC,UR,HPF,POC Few (A) None WBC/hpf   RBC,UR,HPF,POC None None RBC/hpf   Bacteria Few (A) None, Too numerous to count   Mucus Absent Absent   Epithelial Cells, UR Per Microscopy Few (A) None, Too numerous to count cells/hpf  Comprehensive metabolic panel     Status: Abnormal   Collection Time: 05/26/15  9:32 AM  Result Value Ref Range   Sodium 133 (L) 135 - 146 mmol/L   Potassium 4.3 3.5 - 5.3 mmol/L   Chloride 99 98 - 110 mmol/L   CO2 27 20 - 31 mmol/L   Glucose, Bld 76 65 - 99 mg/dL   BUN 12 7 - 25 mg/dL   Creat 0.89 (H) 0.60 - 0.88 mg/dL   Total Bilirubin 0.4 0.2 - 1.2 mg/dL   Alkaline Phosphatase 74 33 -  130 U/L   AST 21 10 - 35 U/L   ALT 13 6 - 29 U/L   Total Protein 6.6 6.1 - 8.1 g/dL   Albumin 4.1 3.6 - 5.1 g/dL   Calcium 8.9 8.6 - 10.4 mg/dL  Urine culture     Status:  None   Collection Time: 05/26/15  9:32 AM  Result Value Ref Range   Colony Count 6,000 COLONIES/ML    Organism ID, Bacteria Insignificant Growth   POCT CBC     Status: Abnormal   Collection Time: 05/26/15  9:34 AM  Result Value Ref Range   WBC 6.8 4.6 - 10.2 K/uL   Lymph, poc 1.8 0.6 - 3.4   POC LYMPH PERCENT 26.3 10 - 50 %L   MID (cbc) 0.3 0 - 0.9   POC MID % 4.4 0 - 12 %M   POC Granulocyte 4.7 2 - 6.9   Granulocyte percent 69.3 37 - 80 %G   RBC 4.01 (A) 4.04 - 5.48 M/uL   Hemoglobin 13.0 12.2 - 16.2 g/dL   HCT, POC 37.3 (A) 37.7 - 47.9 %   MCV 92.9 80 - 97 fL   MCH, POC 32.4 (A) 27 - 31.2 pg   MCHC 34.8 31.8 - 35.4 g/dL   RDW, POC 13.8 %   Platelet Count, POC 252 142 - 424 K/uL   MPV 7.7 0 - 99.8 fL  POCT Wet + KOH Prep     Status: None   Collection Time: 05/26/15  9:46 AM  Result Value Ref Range   Yeast by KOH Absent Present, Absent   Yeast by wet prep Absent Present, Absent   WBC by wet prep None None, Few, Too numerous to count   Clue Cells Wet Prep HPF POC None None, Too numerous to count   Trich by wet prep Absent Present, Absent   Bacteria Wet Prep HPF POC None None, Few, Too numerous to count   Epithelial Cells By [employer] ([facility]) Few None, Few, Too numerous to count   RBC,UR,HPF,POC None None RBC/hpf    Assessment/Plan: Influenza In patient with COPD and mild exacerbation. Flu swab positive. Rx Tamiflu at 30 mg BID due to Crcl. Rx prednisone burst. Continue chronic medications. Supportive measures reviewed. Follow-up Monday

## 2015-06-13 NOTE — Progress Notes (Signed)
Pre visit review using our clinic review tool, if applicable. No additional management support is needed unless otherwise documented below in the visit note. 

## 2015-06-13 NOTE — Patient Instructions (Signed)
Please stay well hydrated and get plenty of rest. Continue chronic medications and cough syrup as directed.  Take the Tamiflu and Prednisone as directed. Follow-up on Monday.  Do not forget to start a daily probiotic!  Influenza, Adult Influenza ("the flu") is a viral infection of the respiratory tract. It occurs more often in winter months because people spend more time in close contact with one another. Influenza can make you feel very sick. Influenza easily spreads from person to person (contagious). CAUSES  Influenza is caused by a virus that infects the respiratory tract. You can catch the virus by breathing in droplets from an infected person's cough or sneeze. You can also catch the virus by touching something that was recently contaminated with the virus and then touching your mouth, nose, or eyes. RISKS AND COMPLICATIONS You may be at risk for a more severe case of influenza if you smoke cigarettes, have diabetes, have chronic heart disease (such as heart failure) or lung disease (such as asthma), or if you have a weakened immune system. Elderly people and pregnant women are also at risk for more serious infections. The most common problem of influenza is a lung infection (pneumonia). Sometimes, this problem can require emergency medical care and may be life threatening. SIGNS AND SYMPTOMS  Symptoms typically last 4 to 10 days and may include:  Fever.  Chills.  Headache, body aches, and muscle aches.  Sore throat.  Chest discomfort and cough.  Poor appetite.  Weakness or feeling tired.  Dizziness.  Nausea or vomiting. DIAGNOSIS  Diagnosis of influenza is often made based on your history and a physical exam. A nose or throat swab test can be done to confirm the diagnosis. TREATMENT  In mild cases, influenza goes away on its own. Treatment is directed at relieving symptoms. For more severe cases, your health care provider may prescribe antiviral medicines to shorten the  sickness. Antibiotic medicines are not effective because the infection is caused by a virus, not by bacteria. HOME CARE INSTRUCTIONS  Take medicines only as directed by your health care provider.  Use a cool mist humidifier to make breathing easier.  Get plenty of rest until your temperature returns to normal. This usually takes 3 to 4 days.  Drink enough fluid to keep your urine clear or pale yellow.  Cover yourmouth and nosewhen coughing or sneezing,and wash your handswellto prevent thevirusfrom spreading.  Stay homefromwork orschool untilthe fever is gonefor at least 52fll day. PREVENTION  An annual influenza vaccination (flu shot) is the best way to avoid getting influenza. An annual flu shot is now routinely recommended for all adults in the [place]:  You experiencechest pain, yourcough worsens,or you producemore mucus.  Youhave nausea,vomiting, ordiarrhea.  Your fever returns or gets worse. SEEK IMMEDIATE MEDICAL CARE IF:  You havetrouble breathing, you become short of breath,or your skin ornails becomebluish.  You have severe painor stiffnessin the neck.  You develop a sudden headache, or pain in the face or ear.  You have nausea or vomiting that you cannot control. MAKE SURE YOU:   Understand these instructions.  Will watch your condition.  Will get help right away if you are not doing well or get worse.   This information is not intended to replace advice given to you by your health care provider. Make sure you discuss any questions you have with your health care provider.   Document Released: 04/04/2000 Document Revised: 04/28/2014 Document Reviewed: 07/07/2011 Elsevier Interactive Patient Education  2016 [employer].

## 2015-06-13 NOTE — Assessment & Plan Note (Signed)
In patient with COPD and mild exacerbation. Flu swab positive. Rx Tamiflu at 30 mg BID due to Crcl. Rx prednisone burst. Continue chronic medications. Supportive measures reviewed. Follow-up Monday

## 2015-06-13 NOTE — Telephone Encounter (Signed)
Caller name:Brian Relationship to patient: Can be reached:[phone] Pharmacy:[facility]  Reason for call:Pharmacy states dosage for Tamiflu is pediatric dose, per Einar Pheasant had to do that due to renal levels.

## 2015-06-18 ENCOUNTER — Emergency Department (HOSPITAL_BASED_OUTPATIENT_CLINIC_OR_DEPARTMENT_OTHER)
Admission: EM | Admit: 2015-06-18 | Discharge: 2015-06-18 | Disposition: A | Discharge status: Home / Self Care | Payer: Medicare Other | Attending: Emergency Medicine | Admitting: Emergency Medicine

## 2015-06-18 ENCOUNTER — Ambulatory Visit (INDEPENDENT_AMBULATORY_CARE_PROVIDER_SITE_OTHER): Payer: Medicare Other | Admitting: Physician Assistant

## 2015-06-18 ENCOUNTER — Encounter (HOSPITAL_BASED_OUTPATIENT_CLINIC_OR_DEPARTMENT_OTHER): Payer: Self-pay | Admitting: *Deleted

## 2015-06-18 ENCOUNTER — Emergency Department (HOSPITAL_BASED_OUTPATIENT_CLINIC_OR_DEPARTMENT_OTHER): Payer: Medicare Other

## 2015-06-18 ENCOUNTER — Encounter: Payer: Self-pay | Admitting: Physician Assistant

## 2015-06-18 VITALS — BP 72/52 | HR 95 | Temp 97.9°F | Ht 71.0 in | Wt 155.2 lb

## 2015-06-18 DIAGNOSIS — Z880 Allergy status to penicillin: Secondary | ICD-10-CM | POA: Insufficient documentation

## 2015-06-18 DIAGNOSIS — Z86010 Personal history of colonic polyps: Secondary | ICD-10-CM | POA: Diagnosis not present

## 2015-06-18 DIAGNOSIS — K219 Gastro-esophageal reflux disease without esophagitis: Secondary | ICD-10-CM | POA: Diagnosis not present

## 2015-06-18 DIAGNOSIS — R05 Cough: Secondary | ICD-10-CM | POA: Diagnosis not present

## 2015-06-18 DIAGNOSIS — M1990 Unspecified osteoarthritis, unspecified site: Secondary | ICD-10-CM | POA: Insufficient documentation

## 2015-06-18 DIAGNOSIS — Q248 Other specified congenital malformations of heart: Secondary | ICD-10-CM | POA: Diagnosis not present

## 2015-06-18 DIAGNOSIS — Z7952 Long term (current) use of systemic steroids: Secondary | ICD-10-CM | POA: Diagnosis not present

## 2015-06-18 DIAGNOSIS — I4891 Unspecified atrial fibrillation: Secondary | ICD-10-CM | POA: Diagnosis not present

## 2015-06-18 DIAGNOSIS — E860 Dehydration: Secondary | ICD-10-CM | POA: Insufficient documentation

## 2015-06-18 DIAGNOSIS — Z87448 Personal history of other diseases of urinary system: Secondary | ICD-10-CM | POA: Diagnosis not present

## 2015-06-18 DIAGNOSIS — J440 Chronic obstructive pulmonary disease with acute lower respiratory infection: Secondary | ICD-10-CM | POA: Diagnosis not present

## 2015-06-18 DIAGNOSIS — Z79899 Other long term (current) drug therapy: Secondary | ICD-10-CM | POA: Insufficient documentation

## 2015-06-18 DIAGNOSIS — F329 Major depressive disorder, single episode, unspecified: Secondary | ICD-10-CM | POA: Insufficient documentation

## 2015-06-18 DIAGNOSIS — Z7951 Long term (current) use of inhaled steroids: Secondary | ICD-10-CM | POA: Diagnosis not present

## 2015-06-18 DIAGNOSIS — I959 Hypotension, unspecified: Secondary | ICD-10-CM | POA: Diagnosis not present

## 2015-06-18 DIAGNOSIS — G20 Parkinson's disease: Secondary | ICD-10-CM | POA: Diagnosis not present

## 2015-06-18 DIAGNOSIS — J40 Bronchitis, not specified as acute or chronic: Secondary | ICD-10-CM

## 2015-06-18 DIAGNOSIS — Z8619 Personal history of other infectious and parasitic diseases: Secondary | ICD-10-CM | POA: Insufficient documentation

## 2015-06-18 DIAGNOSIS — Z87440 Personal history of urinary (tract) infections: Secondary | ICD-10-CM | POA: Insufficient documentation

## 2015-06-18 DIAGNOSIS — F419 Anxiety disorder, unspecified: Secondary | ICD-10-CM | POA: Diagnosis not present

## 2015-06-18 DIAGNOSIS — Z87891 Personal history of nicotine dependence: Secondary | ICD-10-CM | POA: Diagnosis not present

## 2015-06-18 DIAGNOSIS — Z792 Long term (current) use of antibiotics: Secondary | ICD-10-CM | POA: Diagnosis not present

## 2015-06-18 DIAGNOSIS — E785 Hyperlipidemia, unspecified: Secondary | ICD-10-CM | POA: Diagnosis not present

## 2015-06-18 LAB — CBC WITH DIFFERENTIAL/PLATELET
Basophils Absolute: 0 10*3/uL (ref 0.0–0.1)
Basophils Relative: 0 %
Eosinophils Absolute: 0 10*3/uL (ref 0.0–0.7)
Eosinophils Relative: 0 %
HCT: 37.7 % (ref 36.0–46.0)
Hemoglobin: 12.4 g/dL (ref 12.0–15.0)
Lymphocytes Relative: 12 %
Lymphs Abs: 1.4 10*3/uL (ref 0.7–4.0)
MCH: 31.4 pg (ref 26.0–34.0)
MCHC: 32.9 g/dL (ref 30.0–36.0)
MCV: 95.4 fL (ref 78.0–100.0)
Monocytes Absolute: 0.7 10*3/uL (ref 0.1–1.0)
Monocytes Relative: 7 %
Neutro Abs: 9.2 10*3/uL — ABNORMAL HIGH (ref 1.7–7.7)
Neutrophils Relative %: 81 %
Platelets: 215 10*3/uL (ref 150–400)
RBC: 3.95 MIL/uL (ref 3.87–5.11)
RDW: 13.7 % (ref 11.5–15.5)
WBC: 11.3 10*3/uL — ABNORMAL HIGH (ref 4.0–10.5)

## 2015-06-18 LAB — COMPREHENSIVE METABOLIC PANEL
ALT: 30 U/L (ref 14–54)
AST: 30 U/L (ref 15–41)
Albumin: 3.6 g/dL (ref 3.5–5.0)
Alkaline Phosphatase: 59 U/L (ref 38–126)
Anion gap: 8 (ref 5–15)
BUN: 14 mg/dL (ref 6–20)
CO2: 24 mmol/L (ref 22–32)
Calcium: 8.7 mg/dL — ABNORMAL LOW (ref 8.9–10.3)
Chloride: 102 mmol/L (ref 101–111)
Creatinine, Ser: 0.89 mg/dL (ref 0.44–1.00)
GFR calc Af Amer: >60 mL/min (ref >60)
GFR calc non Af Amer: 57 mL/min — ABNORMAL LOW (ref >60)
Glucose, Bld: 102 mg/dL — ABNORMAL HIGH (ref 65–99)
Potassium: 3.7 mmol/L (ref 3.5–5.1)
Sodium: 134 mmol/L — ABNORMAL LOW (ref 135–145)
Total Bilirubin: 0.4 mg/dL (ref 0.3–1.2)
Total Protein: 6.4 g/dL — ABNORMAL LOW (ref 6.5–8.1)

## 2015-06-18 LAB — I-STAT CG4 LACTIC ACID, ED: LACTIC ACID, VENOUS: 2.04 mmol/L — AB (ref 0.5–2.0)

## 2015-06-18 MED ORDER — AZITHROMYCIN 250 MG PO TABS: 250.0000 mg | ORAL_TABLET | Freq: Every day | ORAL | Status: DC

## 2015-06-18 MED ORDER — SODIUM CHLORIDE 0.9 % IV BOLUS (SEPSIS)
2000.0000 mL | Freq: Once | INTRAVENOUS | Status: AC
  Administered 2015-06-18: 2000 mL via INTRAVENOUS

## 2015-06-18 MED ORDER — BENZONATATE 100 MG PO CAPS: 100.0000 mg | ORAL_CAPSULE | Freq: Three times a day (TID) | ORAL | Status: DC

## 2015-06-18 MED FILL — BENZONATATE 100 MG CAPSULE: 100 | 7 days supply | Qty: 21 | Fill #0

## 2015-06-18 MED FILL — AZITHROMYCIN 250 MG TABLET: 250 | 5 days supply | Qty: 6 | Fill #0

## 2015-06-18 NOTE — Progress Notes (Signed)
Patient presents to clinic today c/o for follow-up of influenza. Endorses taking Tamiflu as directed. Was given lower dose due to creatinine clearance.  Has completed medication but denies improvement in symptoms. Endorses SOB, lightheadedness and dizziness. Denies other URI symptoms. Endorses eating well and staying well hydrated.  Past Medical History  Diagnosis Date  . Arthritis   . Hyperlipidemia   . 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 Chronic constipation   . Hemorrhoid   . Varicose veins   . Anxiety   . Depression   . GERD (gastroesophageal reflux disease)   . Atrial fibrillation ([place])   . Recurrent UTI   . Parkinson disease ([place])   . Cardiac arrhythmia due to congenital heart disease   . Mycotic toenails 10/27/2012  . COPD (chronic obstructive pulmonary disease) ([place])   . Parkinsonism [facility])     Current Outpatient Prescriptions on File Prior to Visit  Medication Sig Dispense Refill  . albuterol (VENTOLIN HFA) 108 (90 BASE) MCG/ACT inhaler Inhale 2 puffs into the lungs 2 (two) times daily as needed for wheezing or shortness of breath. 18 g 4  . apixaban (ELIQUIS) 5 MG TABS tablet Take 5 mg by mouth 2 (two) times daily.    . Ascorbic Acid (VITAMIN C) 1000 MG tablet Take 1,000 mg by mouth daily.    Marland Kitchen azelastine (ASTELIN) 0.1 % nasal spray Place 2 sprays into both nostrils at bedtime as needed for rhinitis. Use in each nostril as directed 30 mL 3  . budesonide-formoterol (SYMBICORT) 160-4.5 MCG/ACT inhaler Inhale 2 puffs into the lungs 2 (two) times daily. (Patient taking differently: Inhale 2 puffs into the lungs daily. ) 3 Inhaler 3  . Calcium Carb-Cholecalciferol (CALCIUM 1000 + D PO) Take 1,000 mg by mouth daily.    . celecoxib (CELEBREX) 100 MG capsule Take 100 mg by mouth daily as needed (back pain).    Marland Kitchen diltiazem (CARDIZEM) 30 MG tablet Take 1 tablet (30 mg total) by mouth 4 (four) times daily. Take at onset on atrial fibrillation 30 tablet 0  . flecainide  (TAMBOCOR) 100 MG tablet Take 50 mg by mouth daily as needed. Reported on 05/26/2015    . oseltamivir (TAMIFLU) 30 MG capsule Take 1 capsule (30 mg total) by mouth 2 (two) times daily. 10 capsule 0  . predniSONE (DELTASONE) 20 MG tablet Take 2 tablets (40 mg total) by mouth daily with breakfast. 10 tablet 0  . sertraline (ZOLOFT) 100 MG tablet Take 100 mg by mouth daily. Reported on 05/26/2015    . trimethoprim (TRIMPEX) 100 MG tablet Take 100 mg by mouth daily.    . vitamin B-12 (CYANOCOBALAMIN) 1000 MCG tablet Take 1,000 mcg by mouth daily.     No current facility-administered medications on file prior to visit.    Allergies  Allergen Reactions  . Adhesive [Tape] Other (See Comments)    Caused blisters  . Amoxicillin Other (See Comments)    Caused thrush  . Ciprofloxacin Other (See Comments)    Caused thrush  . Ultram [Tramadol] Other (See Comments)    hypotension    Family History  Problem Relation Age of Onset  . Asthma Brother   . Alcohol abuse Brother   . Throat cancer Father   . Alcohol abuse Father   . Lupus Daughter   . Bipolar disorder Daughter   . Lupus Daughter   . Anxiety disorder Daughter   . Alcohol abuse Sister   . Alcohol abuse Maternal Grandfather   .  Alcohol abuse Paternal Grandmother     Social History   Social History  . Marital Status: Widowed    Spouse Name: N/A  . Number of Children: 2  . Years of Education: N/A   Occupational History  . retired    Social History Main Topics  . Smoking status: Former Smoker -- 2.00 packs/day for 30 years    Types: Cigarettes    Quit date: 04/21/1980  . Smokeless tobacco: Never Used     Comment: onset age 48 -67, up to > 1ppd (almost 2 ppd)  . Alcohol Use: No  . Drug Use: No  . Sexual Activity: No   Other Topics Concern  . None   Social History Narrative   Widowed, lives alone. 1 child in [place] and 1 in [place].   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    Review of Systems - See HPI.  All other ROS are  negative.  BP 72/52 mmHg  Pulse 95  Temp(Src) 97.9 F (36.6 C) (Oral)  Ht '5\' 11"'$  (1.803 m)  Wt 155 lb 3.2 oz (70.398 kg)  BMI 21.66 kg/m2  SpO2 98%  Physical Exam  Constitutional: She is oriented to person, place, and time and well-developed, well-nourished, and in no distress.  HENT:  Head: Normocephalic and atraumatic.  Right Ear: External ear normal.  Left Ear: External ear normal.  Eyes: Conjunctivae are normal.  Neck: Neck supple.  Cardiovascular: Normal rate, regular rhythm, normal heart sounds and intact distal pulses.   Pulmonary/Chest: No respiratory distress. She has wheezes. She has no rales. She exhibits no tenderness.  Neurological: She is alert and oriented to person, place, and time.  Skin: Skin is warm and dry. No rash noted.  Psychiatric: Affect normal.  Vitals reviewed.   Recent Results (from the past 2160 hour(s))  POCT urinalysis dipstick     Status: Abnormal   Collection Time: 05/26/15  9:16 AM  Result Value Ref Range   Color, UA yellow yellow   Clarity, UA clear clear   Glucose, UA negative negative   Bilirubin, UA negative negative   Ketones, POC UA negative negative   Spec Grav, UA 1.010    Blood, UA negative negative   pH, UA 5.5    Protein Ur, POC negative negative   Urobilinogen, UA 0.2    Nitrite, UA Negative Negative   Leukocytes, UA Trace (A) Negative  POCT Microscopic Urinalysis ([facility])     Status: Abnormal   Collection Time: 05/26/15  9:16 AM  Result Value Ref Range   WBC,UR,HPF,POC Few (A) None WBC/hpf   RBC,UR,HPF,POC None None RBC/hpf   Bacteria Few (A) None, Too numerous to count   Mucus Absent Absent   Epithelial Cells, UR Per Microscopy Few (A) None, Too numerous to count cells/hpf  Comprehensive metabolic panel     Status: Abnormal   Collection Time: 05/26/15  9:32 AM  Result Value Ref Range   Sodium 133 (L) 135 - 146 mmol/L   Potassium 4.3 3.5 - 5.3 mmol/L   Chloride 99 98 - 110 mmol/L   CO2 27 20 - 31 mmol/L   Glucose,  Bld 76 65 - 99 mg/dL   BUN 12 7 - 25 mg/dL   Creat 0.89 (H) 0.60 - 0.88 mg/dL   Total Bilirubin 0.4 0.2 - 1.2 mg/dL   Alkaline Phosphatase 74 33 - 130 U/L   AST 21 10 - 35 U/L   ALT 13 6 - 29 U/L   Total Protein 6.6 6.1 -  8.1 g/dL   Albumin 4.1 3.6 - 5.1 g/dL   Calcium 8.9 8.6 - 10.4 mg/dL  Urine culture     Status: None   Collection Time: 05/26/15  9:32 AM  Result Value Ref Range   Colony Count 6,000 COLONIES/ML    Organism ID, Bacteria Insignificant Growth   POCT CBC     Status: Abnormal   Collection Time: 05/26/15  9:34 AM  Result Value Ref Range   WBC 6.8 4.6 - 10.2 K/uL   Lymph, poc 1.8 0.6 - 3.4   POC LYMPH PERCENT 26.3 10 - 50 %L   MID (cbc) 0.3 0 - 0.9   POC MID % 4.4 0 - 12 %M   POC Granulocyte 4.7 2 - 6.9   Granulocyte percent 69.3 37 - 80 %G   RBC 4.01 (A) 4.04 - 5.48 M/uL   Hemoglobin 13.0 12.2 - 16.2 g/dL   HCT, POC 37.3 (A) 37.7 - 47.9 %   MCV 92.9 80 - 97 fL   MCH, POC 32.4 (A) 27 - 31.2 pg   MCHC 34.8 31.8 - 35.4 g/dL   RDW, POC 13.8 %   Platelet Count, POC 252 142 - 424 K/uL   MPV 7.7 0 - 99.8 fL  POCT Wet + KOH Prep     Status: None   Collection Time: 05/26/15  9:46 AM  Result Value Ref Range   Yeast by KOH Absent Present, Absent   Yeast by wet prep Absent Present, Absent   WBC by wet prep None None, Few, Too numerous to count   Clue Cells Wet Prep HPF POC None None, Too numerous to count   Trich by wet prep Absent Present, Absent   Bacteria Wet Prep HPF POC None None, Few, Too numerous to count   Epithelial Cells By [employer] ([facility]) Few None, Few, Too numerous to count   RBC,UR,HPF,POC None None RBC/hpf  POCT Influenza A/B     Status: Abnormal   Collection Time: 06/13/15 11:13 AM  Result Value Ref Range   Influenza A, POC Positive (A) Negative   Influenza B, POC Negative Negative  CBC with Differential/Platelet     Status: Abnormal   Collection Time: 06/18/15 11:05 AM  Result Value Ref Range   WBC 11.3 (H) 4.0 - 10.5 K/uL   RBC 3.95 3.87 - 5.11  MIL/uL   Hemoglobin 12.4 12.0 - 15.0 g/dL   HCT 37.7 36.0 - 46.0 %   MCV 95.4 78.0 - 100.0 fL   MCH 31.4 26.0 - 34.0 pg   MCHC 32.9 30.0 - 36.0 g/dL   RDW 13.7 11.5 - 15.5 %   Platelets 215 150 - 400 K/uL   Neutrophils Relative % 81 %   Neutro Abs 9.2 (H) 1.7 - 7.7 K/uL   Lymphocytes Relative 12 %   Lymphs Abs 1.4 0.7 - 4.0 K/uL   Monocytes Relative 7 %   Monocytes Absolute 0.7 0.1 - 1.0 K/uL   Eosinophils Relative 0 %   Eosinophils Absolute 0.0 0.0 - 0.7 K/uL   Basophils Relative 0 %   Basophils Absolute 0.0 0.0 - 0.1 K/uL    Assessment/Plan: Arterial hypotension With COPD exacerbation in patient with chronic a. Fib and recent influenza. Patient triaged to ER after consulting with ER MD for emergent assessment and IV hydration.

## 2015-06-18 NOTE — Assessment & Plan Note (Signed)
With COPD exacerbation in patient with chronic a. Fib and recent influenza. Patient triaged to ER after consulting with ER MD for emergent assessment and IV hydration.

## 2015-06-18 NOTE — Progress Notes (Signed)
Pre visit review using our clinic review tool, if applicable. No additional management support is needed unless otherwise documented below in the visit note. 

## 2015-06-18 NOTE — ED Notes (Signed)
Pt sts she is being treated for flu by her [facility] PCP x 1 week. She saw the PA this morning for f/u and was found to be hypotensive.

## 2015-06-18 NOTE — Patient Instructions (Signed)
I am sending you downstairs to the ER for an emergent evaluation giving your symptoms and significantly low BP.  The MD on call (Dr Venora Maples) is aware that we are sending you down.

## 2015-06-18 NOTE — ED Provider Notes (Signed)
CSN: [id number]     Arrival date & time 06/18/15  3790 History   First MD Initiated Contact with Patient 06/18/15 1038     Chief Complaint  Patient presents with  . Influenza      HPI Patient presents the emergency department from the clinic today.  She was noted to be hypotensive and had recently been started on treatment for possible influenza last week.  Mild decreased oral intake through the weekend.  No fevers or chills.  Has developed a new productive cough.  No significant shortness of breath.  Denies rash.  Denies nausea vomiting diarrhea.   Past Medical History  Diagnosis Date  . Arthritis   . Hyperlipidemia   . 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 Chronic constipation   . Hemorrhoid   . Varicose veins   . Anxiety   . Depression   . GERD (gastroesophageal reflux disease)   . Atrial fibrillation ([place])   . Recurrent UTI   . Parkinson disease ([place])   . Cardiac arrhythmia due to congenital heart disease   . Mycotic toenails 10/27/2012  . COPD (chronic obstructive pulmonary disease) ([place])   . Parkinsonism [facility])    Past Surgical History  Procedure Laterality Date  . Appendectomy  82 years old  . Colon resection  2008   Family History  Problem Relation Age of Onset  . Asthma Brother   . Alcohol abuse Brother   . Throat cancer Father   . Alcohol abuse Father   . Lupus Daughter   . Bipolar disorder Daughter   . Lupus Daughter   . Anxiety disorder Daughter   . Alcohol abuse Sister   . Alcohol abuse Maternal Grandfather   . Alcohol abuse Paternal Grandmother    Social History  Substance Use Topics  . Smoking status: Former Smoker -- 2.00 packs/day for 30 years    Types: Cigarettes    Quit date: 04/21/1980  . Smokeless tobacco: Never Used     Comment: onset age 89 -89, up to > 1ppd (almost 2 ppd)  . Alcohol Use: No   OB History    No data available     Review of Systems  All other systems reviewed and are negative.     Allergies  Adhesive;  Amoxicillin; Ciprofloxacin; and Ultram  Home Medications   Prior to Admission medications   Medication Sig Start Date End Date Taking? Authorizing Provider  albuterol (VENTOLIN HFA) 108 (90 BASE) MCG/ACT inhaler Inhale 2 puffs into the lungs 2 (two) times daily as needed for wheezing or shortness of breath. 02/05/15   Midge Minium, MD  apixaban (ELIQUIS) 5 MG TABS tablet Take 5 mg by mouth 2 (two) times daily.    Historical Provider, MD  Ascorbic Acid (VITAMIN C) 1000 MG tablet Take 1,000 mg by mouth daily.    Historical Provider, MD  azelastine (ASTELIN) 0.1 % nasal spray Place 2 sprays into both nostrils at bedtime as needed for rhinitis. Use in each nostril as directed 04/11/15   Colon Branch, MD  azithromycin (ZITHROMAX Z-PAK) 250 MG tablet Take 1 tablet (250 mg total) by mouth daily. Take 2 tabs for first dose, then 1 tab for each additional dose 06/18/15   Jola Schmidt, MD  benzonatate (TESSALON) 100 MG capsule Take 1 capsule (100 mg total) by mouth every 8 (eight) hours. 06/18/15   Jola Schmidt, MD  budesonide-formoterol [facility]) 160-4.5 MCG/ACT inhaler Inhale 2 puffs into the lungs 2 (two) times daily. Patient taking  differently: Inhale 2 puffs into the lungs daily.  10/09/14   Brunetta Jeans, PA-C  Calcium Carb-Cholecalciferol (CALCIUM 1000 + D PO) Take 1,000 mg by mouth daily.    Historical Provider, MD  celecoxib (CELEBREX) 100 MG capsule Take 100 mg by mouth daily as needed (back pain).    Historical Provider, MD  diltiazem (CARDIZEM) 30 MG tablet Take 1 tablet (30 mg total) by mouth 4 (four) times daily. Take at onset on atrial fibrillation 02/02/15   Orson Eva, MD  flecainide (TAMBOCOR) 100 MG tablet Take 50 mg by mouth daily as needed. Reported on 05/26/2015    Historical Provider, MD  oseltamivir (TAMIFLU) 30 MG capsule Take 1 capsule (30 mg total) by mouth 2 (two) times daily. 06/13/15   Brunetta Jeans, PA-C  predniSONE (DELTASONE) 20 MG tablet Take 2 tablets (40 mg total) by  mouth daily with breakfast. 06/13/15   Brunetta Jeans, PA-C  sertraline (ZOLOFT) 100 MG tablet Take 100 mg by mouth daily. Reported on 05/26/2015    Historical Provider, MD  trimethoprim (TRIMPEX) 100 MG tablet Take 100 mg by mouth daily.    Historical Provider, MD  vitamin B-12 (CYANOCOBALAMIN) 1000 MCG tablet Take 1,000 mcg by mouth daily.    Historical Provider, MD   BP 125/97 mmHg  Pulse 78  Temp(Src) 98.9 F (37.2 C) (Oral)  Resp 18  Wt 155 lb (70.308 kg)  SpO2 95% Physical Exam  Constitutional: She is oriented to person, place, and time. She appears well-developed and well-nourished. No distress.  HENT:  Head: Normocephalic and atraumatic.  Eyes: EOM are normal.  Neck: Normal range of motion.  Cardiovascular: Normal rate, regular rhythm and normal heart sounds.   Pulmonary/Chest: Effort normal and breath sounds normal.  Abdominal: Soft. She exhibits no distension. There is no tenderness.  Musculoskeletal: Normal range of motion.  Neurological: She is alert and oriented to person, place, and time.  Skin: Skin is warm and dry.  Psychiatric: She has a normal mood and affect. Judgment normal.  Nursing note and vitals reviewed.   ED Course  Procedures (including critical care time) Labs Review Labs Reviewed  CBC WITH DIFFERENTIAL/PLATELET - Abnormal; Notable for the following:    WBC 11.3 (*)    Neutro Abs 9.2 (*)    All other components within normal limits  COMPREHENSIVE METABOLIC PANEL - Abnormal; Notable for the following:    Sodium 134 (*)    Glucose, Bld 102 (*)    Calcium 8.7 (*)    Total Protein 6.4 (*)    GFR calc non Af Amer 57 (*)    All other components within normal limits  I-STAT CG4 LACTIC ACID, ED - Abnormal; Notable for the following:    Lactic Acid, Venous 2.04 (*)    All other components within normal limits  CULTURE, BLOOD (ROUTINE X 2)  CULTURE, BLOOD (ROUTINE X 2)  LACTIC ACID, PLASMA    Imaging Review Dg Chest 2 View  06/18/2015  CLINICAL  DATA:  One week history of cough, congestion and shortness of breath. EXAM: CHEST  2 VIEW COMPARISON:  02/01/2015 FINDINGS: The cardiac silhouette, mediastinal hilar contours are within normal limits and stable. There is tortuosity and calcification of the thoracic aorta there is mild peribronchial thickening and increased interstitial markings along with streaky areas of atelectasis suggesting bronchitis. No focal infiltrate or pleural effusion. The bony thorax is intact. IMPRESSION: Findings suggest bronchitis.  No focal infiltrates or effusion. Electronically Signed   By:  Marijo Sanes M.D.   On: 06/18/2015 12:16   I have personally reviewed and evaluated these images and lab results as part of my medical decision-making.   EKG Interpretation None      MDM   Final diagnoses:  Bronchitis  Dehydration    3:48 PM Patient feels much better after IV fluids.  Suspect volume depletion.  Blood pressure of 220.  Ambulatory in the emergency department.  She's tolerating oral fluid and food at this time.  Patient with chest x-ray consistent with bronchitis.  Given her more productive cough she'll be treated for possible developing pneumonia with azithromycin.  She understands to return to the ER for new or worsening symptoms.    Jola Schmidt, MD 06/18/15 1550

## 2015-06-18 NOTE — ED Notes (Signed)
Critical Lactic Acid results 2.04 reported to Dr. Venora Maples.

## 2015-06-18 NOTE — ED Notes (Signed)
Patient assisted in transferring to the bed, Patient undressed from the waist up and placed in a gown. Patient set up on monitor on O2, and blood pressure every 30 minutes.

## 2015-06-18 NOTE — ED Notes (Signed)
Went to pt's car with [employer] to get pt's cell phone, locked car back and turned phone and keys back to pt.

## 2015-06-23 LAB — CULTURE, BLOOD (ROUTINE X 2)
CULTURE: NO GROWTH
Culture: NO GROWTH

## 2015-06-26 ENCOUNTER — Ambulatory Visit (INDEPENDENT_AMBULATORY_CARE_PROVIDER_SITE_OTHER): Payer: Medicare Other | Admitting: Physician Assistant

## 2015-06-26 ENCOUNTER — Encounter: Payer: Self-pay | Admitting: Physician Assistant

## 2015-06-26 VITALS — BP 104/70 | HR 92 | Temp 98.2°F | Ht 71.0 in | Wt 155.0 lb

## 2015-06-26 DIAGNOSIS — J00 Acute nasopharyngitis [common cold]: Secondary | ICD-10-CM | POA: Diagnosis not present

## 2015-06-26 DIAGNOSIS — B9689 Other specified bacterial agents as the cause of diseases classified elsewhere: Secondary | ICD-10-CM

## 2015-06-26 DIAGNOSIS — J208 Acute bronchitis due to other specified organisms: Secondary | ICD-10-CM

## 2015-06-26 MED ORDER — IPRATROPIUM-ALBUTEROL 0.5-2.5 (3) MG/3ML IN SOLN
3.0000 mL | Freq: Once | RESPIRATORY_TRACT | Status: AC
  Administered 2015-06-26: 3 mL via RESPIRATORY_TRACT

## 2015-06-26 NOTE — Progress Notes (Signed)
Patient presents to clinic today for ER follow-up of bronchitis and dehydration. Patient sent to ER from [place] by this provider on 06/18/15 after patient noted to have worsening URI symptoms s/p flu diagnosis, along with hypotension that was symptomatic. Patient given IV fluids with improvement in symptoms. CXR revealed possible bronchitis. Patient was started on azithromycin and instructed to follow-up with [place] in office.  Patient endorses completing antibiotic as directed. Is feeling better since completion of antibiotic. Endorses some wheezing with exertion. Has not been using her albuterol inhaler as directed. Endorses cough is mostly resolved but mild dry cough from time to time. Denies fever, chills, malaise or fatigue.  Past Medical History  Diagnosis Date  . Arthritis   . Hyperlipidemia   . 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 Chronic constipation   . Hemorrhoid   . Varicose veins   . Anxiety   . Depression   . GERD (gastroesophageal reflux disease)   . Atrial fibrillation ([place])   . Recurrent UTI   . Parkinson disease ([place])   . Cardiac arrhythmia due to congenital heart disease   . Mycotic toenails 10/27/2012  . COPD (chronic obstructive pulmonary disease) ([place])   . Parkinsonism [facility])     Current Outpatient Prescriptions on File Prior to Visit  Medication Sig Dispense Refill  . albuterol (VENTOLIN HFA) 108 (90 BASE) MCG/ACT inhaler Inhale 2 puffs into the lungs 2 (two) times daily as needed for wheezing or shortness of breath. 18 g 4  . apixaban (ELIQUIS) 5 MG TABS tablet Take 5 mg by mouth 2 (two) times daily.    . Ascorbic Acid (VITAMIN C) 1000 MG tablet Take 1,000 mg by mouth daily.    Marland Kitchen azelastine (ASTELIN) 0.1 % nasal spray Place 2 sprays into both nostrils at bedtime as needed for rhinitis. Use in each nostril as directed 30 mL 3  . benzonatate (TESSALON) 100 MG capsule Take 1 capsule (100 mg total) by mouth every 8 (eight) hours. 21 capsule 0  . budesonide-formoterol  (SYMBICORT) 160-4.5 MCG/ACT inhaler Inhale 2 puffs into the lungs 2 (two) times daily. (Patient taking differently: Inhale 2 puffs into the lungs daily. ) 3 Inhaler 3  . Calcium Carb-Cholecalciferol (CALCIUM 1000 + D PO) Take 1,000 mg by mouth daily.    . celecoxib (CELEBREX) 100 MG capsule Take 100 mg by mouth daily as needed (back pain).    Marland Kitchen diltiazem (CARDIZEM) 30 MG tablet Take 1 tablet (30 mg total) by mouth 4 (four) times daily. Take at onset on atrial fibrillation 30 tablet 0  . flecainide (TAMBOCOR) 100 MG tablet Take 50 mg by mouth daily as needed. Reported on 05/26/2015    . sertraline (ZOLOFT) 100 MG tablet Take 100 mg by mouth daily. Reported on 05/26/2015    . trimethoprim (TRIMPEX) 100 MG tablet Take 100 mg by mouth daily.    . vitamin B-12 (CYANOCOBALAMIN) 1000 MCG tablet Take 1,000 mcg by mouth daily.     No current facility-administered medications on file prior to visit.    Allergies  Allergen Reactions  . Adhesive [Tape] Other (See Comments)    Caused blisters  . Amoxicillin Other (See Comments)    Caused thrush  . Ciprofloxacin Other (See Comments)    Caused thrush  . Ultram [Tramadol] Other (See Comments)    hypotension    Family History  Problem Relation Age of Onset  . Asthma Brother   . Alcohol abuse Brother   . Throat cancer Father   .  Alcohol abuse Father   . Lupus Daughter   . Bipolar disorder Daughter   . Lupus Daughter   . Anxiety disorder Daughter   . Alcohol abuse Sister   . Alcohol abuse Maternal Grandfather   . Alcohol abuse Paternal Grandmother     Social History   Social History  . Marital Status: Widowed    Spouse Name: N/A  . Number of Children: 2  . Years of Education: N/A   Occupational History  . retired    Social History Main Topics  . Smoking status: Former Smoker -- 2.00 packs/day for 30 years    Types: Cigarettes    Quit date: 04/21/1980  . Smokeless tobacco: Never Used     Comment: onset age 38 -73, up to > 1ppd (almost  2 ppd)  . Alcohol Use: No  . Drug Use: No  . Sexual Activity: No   Other Topics Concern  . None   Social History Narrative   Widowed, lives alone. 1 child in [place] and 1 in [place].   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   Review of Systems - See HPI.  All other ROS are negative.  BP 104/70 mmHg  Pulse 92  Temp(Src) 98.2 F (36.8 C) (Oral)  Ht '5\' 11"'$  (1.803 m)  Wt 155 lb (70.308 kg)  BMI 21.63 kg/m2  SpO2 98%  Physical Exam  Constitutional: She is oriented to person, place, and time and well-developed, well-nourished, and in no distress.  HENT:  Head: Normocephalic and atraumatic.  Right Ear: External ear normal.  Left Ear: External ear normal.  Nose: Nose normal.  Mouth/Throat: Oropharynx is clear and moist. No oropharyngeal exudate.  Eyes: Conjunctivae are normal.  Neck: Neck supple.  Cardiovascular: Normal rate, regular rhythm, normal heart sounds and intact distal pulses.   Pulmonary/Chest: Effort normal and breath sounds normal.  Very faint wheeze in bilateral lung bases  Lymphadenopathy:    She has no cervical adenopathy.  Neurological: She is alert and oriented to person, place, and time.  Skin: Skin is warm and dry. No rash noted.  Psychiatric: Affect normal.  Vitals reviewed.   Recent Results (from the past 2160 hour(s))  POCT urinalysis dipstick     Status: Abnormal   Collection Time: 05/26/15  9:16 AM  Result Value Ref Range   Color, UA yellow yellow   Clarity, UA clear clear   Glucose, UA negative negative   Bilirubin, UA negative negative   Ketones, POC UA negative negative   Spec Grav, UA 1.010    Blood, UA negative negative   pH, UA 5.5    Protein Ur, POC negative negative   Urobilinogen, UA 0.2    Nitrite, UA Negative Negative   Leukocytes, UA Trace (A) Negative  POCT Microscopic Urinalysis ([facility])     Status: Abnormal   Collection Time: 05/26/15  9:16 AM  Result Value Ref Range   WBC,UR,HPF,POC Few (A) None WBC/hpf   RBC,UR,HPF,POC None None  RBC/hpf   Bacteria Few (A) None, Too numerous to count   Mucus Absent Absent   Epithelial Cells, UR Per Microscopy Few (A) None, Too numerous to count cells/hpf  Comprehensive metabolic panel     Status: Abnormal   Collection Time: 05/26/15  9:32 AM  Result Value Ref Range   Sodium 133 (L) 135 - 146 mmol/L   Potassium 4.3 3.5 - 5.3 mmol/L   Chloride 99 98 - 110 mmol/L   CO2 27 20 - 31 mmol/L   Glucose, Bld  76 65 - 99 mg/dL   BUN 12 7 - 25 mg/dL   Creat 0.89 (H) 0.60 - 0.88 mg/dL   Total Bilirubin 0.4 0.2 - 1.2 mg/dL   Alkaline Phosphatase 74 33 - 130 U/L   AST 21 10 - 35 U/L   ALT 13 6 - 29 U/L   Total Protein 6.6 6.1 - 8.1 g/dL   Albumin 4.1 3.6 - 5.1 g/dL   Calcium 8.9 8.6 - 10.4 mg/dL  Urine culture     Status: None   Collection Time: 05/26/15  9:32 AM  Result Value Ref Range   Colony Count 6,000 COLONIES/ML    Organism ID, Bacteria Insignificant Growth   POCT CBC     Status: Abnormal   Collection Time: 05/26/15  9:34 AM  Result Value Ref Range   WBC 6.8 4.6 - 10.2 K/uL   Lymph, poc 1.8 0.6 - 3.4   POC LYMPH PERCENT 26.3 10 - 50 %L   MID (cbc) 0.3 0 - 0.9   POC MID % 4.4 0 - 12 %M   POC Granulocyte 4.7 2 - 6.9   Granulocyte percent 69.3 37 - 80 %G   RBC 4.01 (A) 4.04 - 5.48 M/uL   Hemoglobin 13.0 12.2 - 16.2 g/dL   HCT, POC 37.3 (A) 37.7 - 47.9 %   MCV 92.9 80 - 97 fL   MCH, POC 32.4 (A) 27 - 31.2 pg   MCHC 34.8 31.8 - 35.4 g/dL   RDW, POC 13.8 %   Platelet Count, POC 252 142 - 424 K/uL   MPV 7.7 0 - 99.8 fL  POCT Wet + KOH Prep     Status: None   Collection Time: 05/26/15  9:46 AM  Result Value Ref Range   Yeast by KOH Absent Present, Absent   Yeast by wet prep Absent Present, Absent   WBC by wet prep None None, Few, Too numerous to count   Clue Cells Wet Prep HPF POC None None, Too numerous to count   Trich by wet prep Absent Present, Absent   Bacteria Wet Prep HPF POC None None, Few, Too numerous to count   Epithelial Cells By [employer] ([facility]) Few None, Few,  Too numerous to count   RBC,UR,HPF,POC None None RBC/hpf  POCT Influenza A/B     Status: Abnormal   Collection Time: 06/13/15 11:13 AM  Result Value Ref Range   Influenza A, POC Positive (A) Negative   Influenza B, POC Negative Negative  CBC with Differential/Platelet     Status: Abnormal   Collection Time: 06/18/15 11:05 AM  Result Value Ref Range   WBC 11.3 (H) 4.0 - 10.5 K/uL   RBC 3.95 3.87 - 5.11 MIL/uL   Hemoglobin 12.4 12.0 - 15.0 g/dL   HCT 37.7 36.0 - 46.0 %   MCV 95.4 78.0 - 100.0 fL   MCH 31.4 26.0 - 34.0 pg   MCHC 32.9 30.0 - 36.0 g/dL   RDW 13.7 11.5 - 15.5 %   Platelets 215 150 - 400 K/uL   Neutrophils Relative % 81 %   Neutro Abs 9.2 (H) 1.7 - 7.7 K/uL   Lymphocytes Relative 12 %   Lymphs Abs 1.4 0.7 - 4.0 K/uL   Monocytes Relative 7 %   Monocytes Absolute 0.7 0.1 - 1.0 K/uL   Eosinophils Relative 0 %   Eosinophils Absolute 0.0 0.0 - 0.7 K/uL   Basophils Relative 0 %   Basophils Absolute 0.0 0.0 - 0.1  K/uL  Comprehensive metabolic panel     Status: Abnormal   Collection Time: 06/18/15 11:05 AM  Result Value Ref Range   Sodium 134 (L) 135 - 145 mmol/L   Potassium 3.7 3.5 - 5.1 mmol/L   Chloride 102 101 - 111 mmol/L   CO2 24 22 - 32 mmol/L   Glucose, Bld 102 (H) 65 - 99 mg/dL   BUN 14 6 - 20 mg/dL   Creatinine, Ser 0.89 0.44 - 1.00 mg/dL   Calcium 8.7 (L) 8.9 - 10.3 mg/dL   Total Protein 6.4 (L) 6.5 - 8.1 g/dL   Albumin 3.6 3.5 - 5.0 g/dL   AST 30 15 - 41 U/L   ALT 30 14 - 54 U/L   Alkaline Phosphatase 59 38 - 126 U/L   Total Bilirubin 0.4 0.3 - 1.2 mg/dL   GFR calc non Af Amer 57 (L) >60 mL/min   GFR calc Af Amer >60 >60 mL/min    Comment: (NOTE) The eGFR has been calculated using the CKD EPI equation. This calculation has not been validated in all clinical situations. eGFR's persistently <60 mL/min signify possible Chronic Kidney Disease.    Anion gap 8 5 - 15  Blood culture (routine x 2)     Status: None   Collection Time: 06/18/15 11:05 AM    Result Value Ref Range   Specimen Description BLOOD LEFT FOREARM    Special Requests BOTTLES DRAWN AEROBIC AND ANAEROBIC 5CC    Culture      NO GROWTH 5 DAYS Performed at [facility]    Report Status 06/23/2015 FINAL   Blood culture (routine x 2)     Status: None   Collection Time: 06/18/15 11:08 AM  Result Value Ref Range   Specimen Description BLOOD RIGHT FOREARM    Special Requests BOTTLES DRAWN AEROBIC AND ANAEROBIC 5CC    Culture      NO GROWTH 5 DAYS Performed at [facility]    Report Status 06/23/2015 FINAL   I-Stat CG4 Lactic Acid, ED     Status: Abnormal   Collection Time: 06/18/15 11:16 AM  Result Value Ref Range   Lactic Acid, Venous 2.04 (HH) 0.5 - 2.0 mmol/L   Comment NOTIFIED PHYSICIAN     Assessment/Plan: Acute bacterial bronchitis Resolving. Mild wheeze noted today. Neb treatment given in office x 1. Supportive measures reviewed. No need for further antibiotic. She is to follow-up if anything recurs.

## 2015-06-26 NOTE — Patient Instructions (Addendum)
Please continue staying hydrated. Use saline nasal spray daily for nasal congestion.  Continue your maintenance inhalers but use the albuterol as directed when needed, every 4-6 hours.   Continue Tessalon as directed.  Follow-up if any symptoms recur. Follow-up with Pulmonary as directed.

## 2015-06-26 NOTE — Progress Notes (Signed)
Pre visit review using our clinic review tool, if applicable. No additional management support is needed unless otherwise documented below in the visit note. 

## 2015-07-01 DIAGNOSIS — B9689 Other specified bacterial agents as the cause of diseases classified elsewhere: Secondary | ICD-10-CM | POA: Insufficient documentation

## 2015-07-01 DIAGNOSIS — J208 Acute bronchitis due to other specified organisms: Principal | ICD-10-CM

## 2015-07-01 NOTE — Assessment & Plan Note (Signed)
Resolving. Mild wheeze noted today. Neb treatment given in office x 1. Supportive measures reviewed. No need for further antibiotic. She is to follow-up if anything recurs.

## 2015-07-10 ENCOUNTER — Other Ambulatory Visit: Payer: Self-pay | Admitting: Family Medicine

## 2015-07-10 NOTE — Telephone Encounter (Signed)
Will defer further refills of patient's medications to PCP  

## 2015-07-12 ENCOUNTER — Ambulatory Visit (INDEPENDENT_AMBULATORY_CARE_PROVIDER_SITE_OTHER): Payer: Medicare Other | Admitting: Psychiatry

## 2015-07-12 DIAGNOSIS — F419 Anxiety disorder, unspecified: Secondary | ICD-10-CM | POA: Diagnosis not present

## 2015-07-13 DIAGNOSIS — H20012 Primary iridocyclitis, left eye: Secondary | ICD-10-CM | POA: Diagnosis not present

## 2015-07-13 DIAGNOSIS — H04123 Dry eye syndrome of bilateral lacrimal glands: Secondary | ICD-10-CM | POA: Diagnosis not present

## 2015-07-13 DIAGNOSIS — H35372 Puckering of macula, left eye: Secondary | ICD-10-CM | POA: Diagnosis not present

## 2015-07-13 DIAGNOSIS — H35352 Cystoid macular degeneration, left eye: Secondary | ICD-10-CM | POA: Diagnosis not present

## 2015-07-16 DIAGNOSIS — H35372 Puckering of macula, left eye: Secondary | ICD-10-CM | POA: Diagnosis not present

## 2015-07-16 DIAGNOSIS — H20012 Primary iridocyclitis, left eye: Secondary | ICD-10-CM | POA: Diagnosis not present

## 2015-07-16 DIAGNOSIS — H35352 Cystoid macular degeneration, left eye: Secondary | ICD-10-CM | POA: Diagnosis not present

## 2015-07-19 ENCOUNTER — Ambulatory Visit
Admission: RE | Admit: 2015-07-19 | Discharge: 2015-07-19 | Disposition: A | Discharge status: Home / Self Care | Payer: Medicare Other | Source: Ambulatory Visit

## 2015-07-19 DIAGNOSIS — Z1231 Encounter for screening mammogram for malignant neoplasm of breast: Secondary | ICD-10-CM | POA: Diagnosis not present

## 2015-07-20 ENCOUNTER — Ambulatory Visit (INDEPENDENT_AMBULATORY_CARE_PROVIDER_SITE_OTHER): Payer: Medicare Other | Admitting: Pulmonary Disease

## 2015-07-20 ENCOUNTER — Encounter: Payer: Self-pay | Admitting: Pulmonary Disease

## 2015-07-20 VITALS — BP 96/56 | HR 99 | Ht 70.0 in | Wt 156.0 lb

## 2015-07-20 DIAGNOSIS — J449 Chronic obstructive pulmonary disease, unspecified: Secondary | ICD-10-CM | POA: Diagnosis not present

## 2015-07-20 NOTE — Progress Notes (Signed)
Subjective:    Patient ID: [name], female    DOB: November 11, 1927, 82 y.o.   MRN: [id number]  HPI Follow-up for COPD.  [name] is a 82 year old with mild Gold stage I COPD (FEV1 76% on 2013). She is a former patient of Dr. Gwenette Greet. She is just maintained on Symbicort and is doing well. She uses the inhaler only as needed. She's had an episode of flu about 4 months back and was seen in the emergency room and her primary care office last month for bronchitis. She was treated with azithromycin.  She has some chronic cough with sputum production, dyspnea on exertion. No fevers, chills, hemoptysis.  DATA: PFTs 05/14/11 FVC 2.79 [79%) FEV1 1.91 [76%) F/F 69. Mild obstructive defect.  CXR 06/18/15 Bronchitis. Images reviewed.  Social History: 60-pack-year smoking history. Quit in 1982. No alcohol, drug use. She is widowed and lives alone.  Family history: Alcohol abuse-brother, father, Fatima Sanger parents, sister. Lupus-Dr. Asthma-brother.  Past Medical History  Diagnosis Date  . Arthritis   . Hyperlipidemia   . 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 Chronic constipation   . Hemorrhoid   . Varicose veins   . Anxiety   . Depression   . GERD (gastroesophageal reflux disease)   . Atrial fibrillation ([place])   . Recurrent UTI   . Parkinson disease ([place])   . Cardiac arrhythmia due to congenital heart disease   . Mycotic toenails 10/27/2012  . COPD (chronic obstructive pulmonary disease) ([place])   . Parkinsonism ([place])     Current outpatient prescriptions:  .  albuterol (VENTOLIN HFA) 108 (90 BASE) MCG/ACT inhaler, Inhale 2 puffs into the lungs 2 (two) times daily as needed for wheezing or shortness of breath., Disp: 18 g, Rfl: 4 .  apixaban (ELIQUIS) 5 MG TABS tablet, Take 5 mg by mouth 2 (two) times daily., Disp: , Rfl:  .  Ascorbic Acid (VITAMIN C) 1000 MG tablet, Take 1,000 mg by mouth daily., Disp: , Rfl:  .  budesonide-formoterol (SYMBICORT) 160-4.5 MCG/ACT inhaler,  Inhale 2 puffs into the lungs 2 (two) times daily. (Patient taking differently: Inhale 2 puffs into the lungs daily. ), Disp: 3 Inhaler, Rfl: 3 .  Calcium Carb-Cholecalciferol (CALCIUM 1000 + D PO), Take 1,000 mg by mouth daily., Disp: , Rfl:  .  celecoxib (CELEBREX) 100 MG capsule, Take 100 mg by mouth daily as needed (back pain)., Disp: , Rfl:  .  diltiazem (CARDIZEM) 30 MG tablet, Take 1 tablet (30 mg total) by mouth 4 (four) times daily. Take at onset on atrial fibrillation, Disp: 30 tablet, Rfl: 0 .  flecainide (TAMBOCOR) 100 MG tablet, Take 50 mg by mouth daily as needed. Reported on 05/26/2015, Disp: , Rfl:  .  sertraline (ZOLOFT) 100 MG tablet, Take 100 mg by mouth daily. Reported on 05/26/2015, Disp: , Rfl:  .  trimethoprim (TRIMPEX) 100 MG tablet, Take 100 mg by mouth daily., Disp: , Rfl:  .  vitamin B-12 (CYANOCOBALAMIN) 1000 MCG tablet, Take 1,000 mcg by mouth daily., Disp: , Rfl:  .  azelastine (ASTELIN) 0.1 % nasal spray, Place 2 sprays into both nostrils at bedtime as needed for rhinitis. Use in each nostril as directed (Patient not taking: Reported on 07/20/2015), Disp: 30 mL, Rfl: 3   Review of Systems Cough, sputum production, dyspnea on exertion. Denies any wheezing, hemoptysis. Denies any nausea, vomiting, diarrhea, constipation. Denies any chest pain, palpitations. Denies any fevers, chills, loss of weight, loss of appetite, malaise, fatigue. All  other review of systems negative.    Objective:   Physical Exam Blood pressure 96/56, pulse 99, height '5\' 10"'$  (1.778 m), weight 156 lb (70.761 kg), SpO2 96 %. Gen: No apparent distress Neuro: No gross focal deficits. HEENT: No JVD, lymphadenopathy, thyromegaly. RS: Clear, No wheeze or crackles CVS: S1-S2 heard, no murmurs rubs gallops. Abdomen: Soft, positive bowel sounds. Musculoskeletal: No edema.    Assessment & Plan:  Mild COPD  She is stable on current inhalers. She is using Symbicort only occasionally on an as-needed  basis. I have encouraged her to start using this more regularly. She had a lot of questions regarding her disease and the recent chest x-ray. All questions were answered.  Plan: - Continue symbicort.  Return to clinic in 1 year.

## 2015-07-20 NOTE — Patient Instructions (Signed)
Please continue using the Symbicort as prescribed.  Return to clinic in 1 year.

## 2015-07-24 DIAGNOSIS — H20012 Primary iridocyclitis, left eye: Secondary | ICD-10-CM | POA: Diagnosis not present

## 2015-07-24 DIAGNOSIS — H35372 Puckering of macula, left eye: Secondary | ICD-10-CM | POA: Diagnosis not present

## 2015-07-24 DIAGNOSIS — H35352 Cystoid macular degeneration, left eye: Secondary | ICD-10-CM | POA: Diagnosis not present

## 2015-07-25 DIAGNOSIS — D239 Other benign neoplasm of skin, unspecified: Secondary | ICD-10-CM | POA: Diagnosis not present

## 2015-07-25 DIAGNOSIS — L570 Actinic keratosis: Secondary | ICD-10-CM | POA: Diagnosis not present

## 2015-07-25 DIAGNOSIS — L820 Inflamed seborrheic keratosis: Secondary | ICD-10-CM | POA: Diagnosis not present

## 2015-07-26 ENCOUNTER — Ambulatory Visit (INDEPENDENT_AMBULATORY_CARE_PROVIDER_SITE_OTHER): Payer: Medicare Other | Admitting: Psychiatry

## 2015-07-26 DIAGNOSIS — F419 Anxiety disorder, unspecified: Secondary | ICD-10-CM | POA: Diagnosis not present

## 2015-07-31 ENCOUNTER — Ambulatory Visit: Payer: Medicare Other | Attending: Psychology | Admitting: Psychology

## 2015-07-31 DIAGNOSIS — G3184 Mild cognitive impairment, so stated: Secondary | ICD-10-CM | POA: Insufficient documentation

## 2015-07-31 DIAGNOSIS — F411 Generalized anxiety disorder: Secondary | ICD-10-CM | POA: Insufficient documentation

## 2015-08-01 ENCOUNTER — Encounter: Payer: Self-pay | Admitting: Psychology

## 2015-08-01 NOTE — Progress Notes (Signed)
[facility]  [address]   Telephone [phone] [address] Fax [phone] [place], [place] [address]  Initial Contact Note  Name:  [name] Date of Birth; [birth year] MRN:  [id number] Date:  08/01/2015  [name] is an 82 y.o. female who was referred for neuropsychological evaluation by Alonza Bogus, DO of [facility].   A total of 5 hours was spent today reviewing medical records, interviewing (CPT [phone]) [name] and administering and scoring neurocognitive tests (CPT [phone] & [phone]).  Preliminary Diagnostic Impressions: Mild cognitive impairment, nonamnestic type [G31.84] Generalized anxiety disorder [F41.1]  There were no concerns expressed or behaviors displayed by [name] that would require immediate attention.   A full report will follow once the planned testing has been completed. Her next appointment is scheduled for 08/08/15.Jamey Ripa, Ph.D Licensed Psychologist 08/01/2015

## 2015-08-06 ENCOUNTER — Telehealth: Payer: Self-pay | Admitting: Pulmonary Disease

## 2015-08-06 NOTE — Telephone Encounter (Signed)
Called and spoke to pt. Pt requesting Symbicort 160 samples. Advised pt that we do not have any symbicort samples at this time and that we can send out a coupon and pt assistance for pt; both coupon and pt assistance placed in outgoing mail. Pt verbalized understanding and denied any further questions or concerns at this time.

## 2015-08-07 DIAGNOSIS — H43812 Vitreous degeneration, left eye: Secondary | ICD-10-CM | POA: Diagnosis not present

## 2015-08-07 DIAGNOSIS — H35373 Puckering of macula, bilateral: Secondary | ICD-10-CM | POA: Diagnosis not present

## 2015-08-07 DIAGNOSIS — H3581 Retinal edema: Secondary | ICD-10-CM | POA: Diagnosis not present

## 2015-08-08 ENCOUNTER — Encounter: Payer: Self-pay | Admitting: Psychology

## 2015-08-08 ENCOUNTER — Ambulatory Visit (INDEPENDENT_AMBULATORY_CARE_PROVIDER_SITE_OTHER): Payer: Medicare Other | Admitting: Psychology

## 2015-08-08 DIAGNOSIS — G3184 Mild cognitive impairment, so stated: Secondary | ICD-10-CM | POA: Diagnosis not present

## 2015-08-08 DIAGNOSIS — F411 Generalized anxiety disorder: Secondary | ICD-10-CM

## 2015-08-08 NOTE — Progress Notes (Signed)
[facility]  [address]   Telephone [phone] [address] Fax [phone] [place], [place] [address]   [place]* This report should not be released without the consent of the client  Name:   [name]   Date of Birth:  [birth year] [facility] MR#:  [id number] Date of Evaluation: 07/31/15 & 08/08/15  Reason for Referral [name] is an 82 year-old right-handed woman who was referred for neuropsychological evaluation by Alonza Bogus, DO of [facility] as prompted by Ms. Sutherland's report of memory decline over the past two years. She was initially diagnosed with Parkinson's disease in 2011 though Dr. 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 An MRI of the brain on 11/23/13 showed signs of moderate chronic small vessel ischemic disease and moderate cerebral atrophy.   Sources of Information Electronic medical records from the [facility] were reviewed. Ms. Appelhans was interviewed.   Chief Complaints Ms. Clagg reported having noticed a gradual decline in her concentration and memory over the past two years. She gave recent examples of having forgotten to take the second daily dose of a medication, not recalling the name of a familiar person until later, misplacing objects and having trouble balancing her checkbook. Most of her bills have been set up for automatic payment. She did not report having missed any appointments as she has been recording them on a calendar. She did not report problems performing her personal care activities or with using household appliances, cooking or driving.   Her other complaints included chronic lower back pain (since age 75), "spots" in her left eye vision,  parkinsonism symptoms and reduced energy level  (has been taking daytime naps for the past year). She did not report any problems with vision, sleep (she awakens most nights one or  two times to go to the bathroom), appetite, swallowing, speech, smell or taste.   She was not aware of any recent changes in her mood, personality or habits. She reported a lifelong history of feeling anxious that has intensified within the past twenty years. She stated that she has constantly worried about her health and the wellbeing of her children. She did not cite any new life stressors within the past five years. She described herself as highly self-critical, especially when aware of making an error or being unable to remember something. She did not report a history of panic attacks, specific phobias or exposure to trauma. She reported feeling periodically depressed, primarily about being so anxious. She denied experiencing persisting sad mood, apathy, suicidal thoughts or loss of interest. She reported no history of mood instability or psychotic symptoms.  She has been receiving both psychological and psychiatric services for the past few years.    Background  She has been living alone since she was widowed in 2007 after 50 three years of marriage. She has two adult children who live out of state.   She retired at the age of 72. 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   She reported that she was graduated from high school as an Film/video editor who was never good at retaining new information. She reported that she was never retained in grade or placed in special education classes.    In addition to Vascular Parkinsonism, her past medical history was notable for arthritis, atrial fibrillation,  chronic obstructive pulmonary disease, gastroesophageal reflux disease, hyperlipidemia and peripheral neuropathy.   Her current medications include albuterol inhaler as needed, apixaban, celecoxib as needed, diltiazem as needed, flecainide as needed, sertraline,  Symbicort, and vitamins B-12 and C.  She did not report history of any unusual childhood illnesses or developmental delays. She reported that she might have suffered a concussion in a fall in 2012 or 2013 though did not report having experienced any subsequent symptoms or problems. She reported no history of other head injury, loss of consciousness, seizure activity, stroke-like symptoms or exposure to toxic chemicals.   She reported rare alcohol use without past history of abuse. She reported that she has never used illicit drugs. She is a former cigarette smoker who quit in 1982.  With regards to her mental health history, she was diagnosed with Anxiety disorder not otherwise specified in 2012 by a psychiatrist with [facility]. She has received outpatient psychiatric care since then and psychological treatment since 2014. She has been on sertraline for several years. Trials on bupropion and fluoxetine were not successful. She reported no history of mood instability, suicidal behavior or psychotic symptoms.  She reported no family history of memory disorder or dementia.   Observations She appeared as an appropriately dressed and groomed woman in no apparent distress. She appeared younger than her stated age. She walked steadily with a small step gait while using a cane. No postural abnormalities were evident. She interacted in a pleasant, animated and cooperative manner. Her affect appeared within a wide range and without lability. Her mood seemed mildly anxious. Her speech was slightly pressured in rate with occasional hesitations, likely secondary to anxiety. Her voice sounded slightly hoarse at times, usually after extended speaking. No problems were evident for speech articulation, prosody, word finding, word selection, message coherence or language comprehension. Her thought processes were coherent and organized without loose associations, verbal perseverations or flight of ideas. Her thought  content was marked by a tendency to make self-deprecatory comments but was devoid of unusual or bizarre ideas.  Evaluation Procedures Animal Naming Test  [employer] Test Clock Drawing Test Controlled Oral Word Association Test Finger Tapping Test Geriatric Anxiety Scale  Geriatric Depression Scale Judgment of Line Orientation Modified Wisconsin Card Sorting Test Rey Complex Figure: Copy Trail Making A & B Visual Form Discrimination Wechsler Adult Intelligence Scale-IV: Music therapist, Coding, Digit Span & Similarities  Wechsler Memory Scale-IV: Older Adult Battery Wide Range Achievement Test-4: Word Reading  Assessment Results Test Validity & Interpretive Considerations It was concluded that the test results represented a valid measure of her current cognitive functioning. She did not display signs of physical or emotional distress. She did not report or display problems with vision (she wore her eyeglasses) or hearing. She displayed a mild hand tremor that did not appear to substantially interfere with her ability to draw or manipulate test materials. She was able to comprehend task instructions without difficulty. She appeared to sustain attention and persist to task. She appeared to expend maximal effort.   Her pre-morbid intellectual potential was estimated to fall within the Average range based on her educational background coupled with a measure of word reading (Wide Range Achievement Test-4), which is usually resistant to the effects of neurological and psychiatric disorder and thus offers a good estimate of pre-morbid verbal intellectual ability.   Her test scores were corrected to reflect norms for her age and, whenever possible, her gender and educational level (i.e., 12 years). A listing of test  results can be found at the end of this report.  Speed of Processing Her speed to discriminate similarities and differences amongst sets of geometric symbols (Wechsler Adult  Intelligence Scale-IV (WAIS-IV) Symbol Search) or to draw lines to connect randomly arrayed numbers in sequence (Trails A) fell within the Average range.   Attention & Executive Functions She scored within the Average range on untimed tests of working memory that required immediate recognition of symbols in left to right order (Wechsler Memory Scale-IV (WMS-IV) Symbol Span) or mental rearranging of randomized digits in ascending order (WAIS-IV Digit Span Sequencing). In contrast, her ability to mentally rearrange digits in reverse order (WAIS-IV Digit Span Backward) fell at the lower boundary of the Low Average range.   She struggled on timed tests that required cognitive flexibility. She was unable to complete a complex visual sequencing test that required mental tracking and set shifting (Trails B) as this test was discontinued after multiple errors and an excessive amount of time. Measures of fluent verbal production were lower than expected as her ability to generate words to designated letters (Controlled Oral Word Association Test) was within the Low Average range while her ability to generate members of a category (Animal Naming Test) fell within the Borderline range.   Her ability to form and express abstract verbal concepts (WAIS-IV Similarities) was lower than expected as she scored at the lower boundary of the Low Average range.   Her performance of a nonverbal problem-solving test that demanded inferential reasoning and conceptual flexibility (Modified Wisconsin Card Sorting Test) fell within the Average range.   Learning & Memory A composite measure of her ability to immediately recall verbal and visual information (WMS-IV Immediate Memory Index) fell within the Average range. All of her immediate memory subtest scores were at least average with a relative strength for learning word pairs via association. A measure of her delayed recall of verbal and visual information (WMS-IV Delayed Memory  Index) likewise fell within the Average range. Her Delayed Memory Index was as expected given her Immediate Memory Index, which indicated normal retention of information initially learned.  At a finer level of analysis, however, her spontaneous delayed recall was uneven as her recall of stories read to her far exceeded expectations (i.e., savings= >100%) while her delayed recall of word pairs and figural designs were lower than expected relative to her initial level of encoding. In both cases, her delayed recall was significantly improved using a recognition memory format, which suggested that she was able to store information but could not readily retrieve it. With regards to the modality of the information, her ability to learn and retain orally-presented information (WMS-IV Auditory Memory Index) fell within the High Average range, which was significantly better than her ability to learn and retain visual information (WMS-IV Visual Memory Index), which fell within the Low Average range.  Language A measure of confrontational naming [facility] Naming Test) fell within the Average range. Her phonemic fluency, based on her ability to generate words to designated letters (Controlled Oral Word Association Test) was within the Low Average range. Her ability to read words (Wide Range Achievement Test-4: Word Reading) fell at the upper boundary of the Average range.  Visual-Spatial Perception & Organization There were no signs of spatial inattention or scanning defect. Her ability to recognize visual forms (Visual Form Discrimination Test) was normal. In contrast, her ability to judge angular relationships between line segments (Judgment of Line Orientation Test) was well within the impaired range. She also scored within  the impaired range on a test of visual-spatial organization that required assembly of two-dimensional block designs from models Product manager). Constructional difficulties were evident as her  drawing of a clock face (Clock Drawing Test) was mildly impaired due to inaccurate spatial arrangement of the numbers and a distorted representation of the two hands. Her copy of a spatially complex geometric figure (Rey Complex Figure) was recognizable but indicative of the effects of tremor.   Motor She demonstrated consistent right hand preference. She showed signs of mild tremor without gross disscoordination. Her fine motor speed (Finger Tapping) was within the Average range for her right hand and the Very Superior range for her left hand. She showed an atypical pattern of faster non-dominant hand fine motor speed.   Emotional Status On the Geriatric Depression Scale, her score of 9/30 was just below the cut-off to indicate probable depression. She primarily endorsed items indicative of lowered energy level, worry or cognitive difficulties. On the Geriatric Anxiety Scale, her score of 28 fell within the moderate range. She endorsed somatic, affective and cognitive aspects of anxiety.    Summary & Conclusions [name] is an 82 year-old woman with a diagnosis of Vascular Parkinsonism who reported an approximate two year history of memory difficulties with well-preserved activities of daily living.   Observations of this pleasant and anxious woman were notable for small step gait, mild hand tremor, slightly pressured speech with occasional hesitations, subtle decline in voice quality after extended speaking (mild dysphonia?) and tendency to make self-deprecatory comments.    Her neuropsychological profile was notable for mild impairment of visuospatial perception and construction as well as of cognitive flexibility (i.e., complex mental tracking, set shifting, fluent verbal generation). Her speed of mental processing as well as her fine motor speed were within normal expectations though her non-dominant (left) hand was atypically faster than her dominant (right) hand. Her memory functioning  was intact for her age for acquisition and storage though she had some problems retrieving information from memory storage after a delay. Her auditory memory was a relative strength within the High Average range while her visual memory was relatively weaker within the Low Average range.  With regards to her emotional functioning, she reported a longtime problem with anxiety characterized by worrisome thoughts and self-critical attitude. She did not report any current difficulties with psychosocial adjustment. On standardized symptom questionnaires she endorsed a moderate degree of anxiety but scored within the normal range on a screening for depression.    In conclusion, her neuropsychological profile indicative of impairments for visuospatial-constructional functioning and aspects of executive function is often observed in vascular and movement disorders as well as other types of neurological disorders. Her relatively intact memory functioning would argue against an incipient dementia of the Alzheimer's type. Her chronic anxiety, which predated the emergence of her neurological symptoms, could well be disrupting her every day cognitive functioning but is not considered to be a primary factor with regards to her cognitive complaints.      Diagnostic Impressions Mild cognitive impairment-non-amnestic type [G31.84] Generalized anxiety disorder [F41.1]  Recommendations 1. While she did not report any problems with driving, her weakness for visuospatial judgment suggests that it would be prudent to formally assess her driving competency.  2. At this time, she seems capable of continuing to live independently. She has put her name on a waiting list for an assisted living facility, which she might need in the future.    3. She was advised to avoid multi-tasking whenever  possible.She should continue to use strategies to aid her memory, such as writing down important information on a calendar and setting a  phone alarm to remind her to take medications.    4. She stated her intent to continue individual psychotherapy to help her manage anxiety, which is a good idea.   5. A repeat neuropsychological evaluation in one to two years (or sooner if clinically indicated) using the current test results as a baseline is recommended to track for interval changes, if any, in her cognitive functioning.   I have appreciated the opportunity to evaluate Ms. Waitman. The results and recommendations from this evaluation were discussed with her on 08/08/15. Please feel free to contact me with any comments or questions.     ______________________ Jamey Ripa, Ph.D Licensed Psychologist     Copies to: Alonza Bogus, DO   [facility]    Me. Governor Specking (per her request)        ADDENDUM-NEUROPSYCHOLOGICAL TEST RESULTS Animal Naming Test Score= 10 3rd (adjusted for age, gender and educational level)   [place] Score= 50/60 47th (adjusted for age, gender and educational level)   Clock Drawing Test Score=6/10 Mild impairment. Error in spatial arrangement of numbers, distorted representation of the 2 hands.   Controlled Oral Word Association Test Score= 23 words/2 repetitions  9th (adjusted for age, gender and educational level)   Finger Tapping R: 2 47th (adjusted for age, gender and educational level)  L:  45 98th  (adjusted for age, gender and educational level)   Judgment of Line Orientation Test Score= 13/30 Severe impairment    Modified [employer] Test  Categories correct       5 42nd (adjusted for age and education)  Perseverative errors      5 38th             Total errors       22 66th        Executive function composite    96 39th        Trails A Score=  36s  0e 68th (adjusted for age, gender and educational level)  Trails B Score= discontinued after 300s and 5e abnormal   Visual Form Discrimination Test Score= 16/16  Normal    Wechsler  Adult Intelligence Scale-IV   Subtest Scaled Score Percentile  Block Design   4     2nd     Similarities   6     9th     Digit Span   Forward   Backward   Sequencing   '8    6   6 12   '$ 25th        9th      9th     75th     Symbol Search   12   75th       Wechsler Memory Scale-IV Older Adult Battery Index Index Score Percentile  Immediate Memory 104 61st        Auditory Memory 112 79th      Visual Memory   82 12th       Delayed Memory   98 45th       Symbol Span subtest Scaled score= 10 50th        Wide Range Achievement Test-4 Subtest  Raw score Standard score Percentile  Word Reading 60/70 110 75th

## 2015-08-09 ENCOUNTER — Ambulatory Visit (INDEPENDENT_AMBULATORY_CARE_PROVIDER_SITE_OTHER): Payer: Medicare Other | Admitting: Psychiatry

## 2015-08-09 DIAGNOSIS — F064 Anxiety disorder due to known physiological condition: Secondary | ICD-10-CM

## 2015-08-15 ENCOUNTER — Telehealth: Payer: Self-pay | Admitting: Pulmonary Disease

## 2015-08-15 DIAGNOSIS — M1611 Unilateral primary osteoarthritis, right hip: Secondary | ICD-10-CM | POA: Diagnosis not present

## 2015-08-15 DIAGNOSIS — M545 Low back pain: Secondary | ICD-10-CM | POA: Diagnosis not present

## 2015-08-15 DIAGNOSIS — M1612 Unilateral primary osteoarthritis, left hip: Secondary | ICD-10-CM | POA: Diagnosis not present

## 2015-08-15 NOTE — Telephone Encounter (Signed)
Called spoke with pt. She states she needs a rx for symbicort. I explained to her that Bennetta Laos is the last one to fill the rx and he is not with our practice. She states that she will contact his office and if she has any issues she will call back. She voiced understanding and had no further questions.

## 2015-08-16 ENCOUNTER — Ambulatory Visit (INDEPENDENT_AMBULATORY_CARE_PROVIDER_SITE_OTHER): Payer: Medicare Other | Admitting: Podiatry

## 2015-08-16 ENCOUNTER — Encounter: Payer: Self-pay | Admitting: Podiatry

## 2015-08-16 DIAGNOSIS — M79676 Pain in unspecified toe(s): Secondary | ICD-10-CM | POA: Diagnosis not present

## 2015-08-16 DIAGNOSIS — B351 Tinea unguium: Secondary | ICD-10-CM | POA: Diagnosis not present

## 2015-08-16 NOTE — Progress Notes (Signed)
Patient ID: JAILENE CUPIT, female   DOB: 19-Oct-1927, 82 y.o.   MRN: [id number] Complaint:  Visit Type: Patient returns to my office for continued preventative foot care services. Complaint: Patient states" my nails have grown long and thick and become painful to walk and wear shoes" . She presents for preventative foot care services. No changes to ROS.  She says she has been hospitalized with atrial fib and asthma.  Podiatric Exam: Vascular: dorsalis pedis and posterior tibial pulses are palpable bilateral. Capillary return is immediate. Temperature gradient is WNL. Skin turgor WNL  Sensorium: Normal Semmes Weinstein monofilament test. Normal tactile sensation bilaterally. Nail Exam: Pt has thick disfigured discolored nails with subungual debris noted bilateral entire nail hallux through fifth toenails Ulcer Exam: There is no evidence of ulcer or pre-ulcerative changes or infection. Orthopedic Exam: Muscle tone and strength are WNL. No limitations in general ROM. No crepitus or effusions noted. Foot type and digits show no abnormalities. Bony prominences are unremarkable. Skin: No Porokeratosis. No infection or ulcers  Diagnosis:  Tinea unguium, Pain in right toe, pain in left toes  Treatment & Plan Procedures and Treatment: Consent by patient was obtained for treatment procedures. The patient understood the discussion of treatment and procedures well. All questions were answered thoroughly reviewed. Debridement of mycotic and hypertrophic toenails, 1 through 5 bilateral and clearing of subungual debris. No ulceration, no infection noted.  Return Visit-Office Procedure: Patient instructed to return to the office for a follow up visit 3 months for continued evaluation and treatment.   Gardiner Barefoot DPM

## 2015-08-20 HISTORY — PX: OTHER SURGICAL HISTORY: SHX169

## 2015-08-22 DIAGNOSIS — I480 Paroxysmal atrial fibrillation: Secondary | ICD-10-CM | POA: Diagnosis not present

## 2015-08-22 DIAGNOSIS — R0602 Shortness of breath: Secondary | ICD-10-CM | POA: Diagnosis not present

## 2015-08-25 ENCOUNTER — Other Ambulatory Visit: Payer: Self-pay | Admitting: Family Medicine

## 2015-09-04 ENCOUNTER — Ambulatory Visit (INDEPENDENT_AMBULATORY_CARE_PROVIDER_SITE_OTHER): Payer: Medicare Other | Admitting: Psychiatry

## 2015-09-04 DIAGNOSIS — F411 Generalized anxiety disorder: Secondary | ICD-10-CM | POA: Diagnosis not present

## 2015-09-13 DIAGNOSIS — H34832 Tributary (branch) retinal vein occlusion, left eye, with macular edema: Secondary | ICD-10-CM | POA: Diagnosis not present

## 2015-09-13 DIAGNOSIS — H35373 Puckering of macula, bilateral: Secondary | ICD-10-CM | POA: Diagnosis not present

## 2015-09-13 DIAGNOSIS — H3581 Retinal edema: Secondary | ICD-10-CM | POA: Diagnosis not present

## 2015-09-13 DIAGNOSIS — H35372 Puckering of macula, left eye: Secondary | ICD-10-CM | POA: Diagnosis not present

## 2015-09-14 DIAGNOSIS — H35373 Puckering of macula, bilateral: Secondary | ICD-10-CM | POA: Diagnosis not present

## 2015-09-21 DIAGNOSIS — H35373 Puckering of macula, bilateral: Secondary | ICD-10-CM | POA: Diagnosis not present

## 2015-10-03 ENCOUNTER — Encounter: Payer: Self-pay | Admitting: Internal Medicine

## 2015-10-03 ENCOUNTER — Ambulatory Visit (INDEPENDENT_AMBULATORY_CARE_PROVIDER_SITE_OTHER): Payer: Medicare Other | Admitting: Internal Medicine

## 2015-10-03 VITALS — BP 124/76 | HR 81 | Temp 98.0°F | Ht 70.0 in | Wt 154.0 lb

## 2015-10-03 DIAGNOSIS — I4891 Unspecified atrial fibrillation: Secondary | ICD-10-CM

## 2015-10-03 DIAGNOSIS — E785 Hyperlipidemia, unspecified: Secondary | ICD-10-CM | POA: Diagnosis not present

## 2015-10-03 DIAGNOSIS — F329 Major depressive disorder, single episode, unspecified: Secondary | ICD-10-CM

## 2015-10-03 DIAGNOSIS — F418 Other specified anxiety disorders: Secondary | ICD-10-CM | POA: Diagnosis not present

## 2015-10-03 DIAGNOSIS — Z09 Encounter for follow-up examination after completed treatment for conditions other than malignant neoplasm: Secondary | ICD-10-CM

## 2015-10-03 DIAGNOSIS — F32A Depression, unspecified: Secondary | ICD-10-CM

## 2015-10-03 DIAGNOSIS — F419 Anxiety disorder, unspecified: Secondary | ICD-10-CM

## 2015-10-03 DIAGNOSIS — Z0000 Encounter for general adult medical examination without abnormal findings: Secondary | ICD-10-CM

## 2015-10-03 NOTE — Progress Notes (Signed)
Pre visit review using our clinic review tool, if applicable. No additional management support is needed unless otherwise documented below in the visit note. 

## 2015-10-03 NOTE — Patient Instructions (Signed)
GO TO THE LAB : Get the blood work     GO TO THE FRONT DESK Schedule your next appointment for a  routine checkup in 6 months     Take calcium and vitamin D daily    Fall Prevention and Home Safety Falls cause injuries and can affect all age groups. It is possible to use preventive measures to significantly decrease the likelihood of falls. There are many simple measures which can make your home safer and prevent falls. OUTDOORS  Repair cracks and edges of walkways and driveways.  Remove high doorway thresholds.  Trim shrubbery on the main path into your home.  Have good outside lighting.  Clear walkways of tools, rocks, debris, and clutter.  Check that handrails are not broken and are securely fastened. Both sides of steps should have handrails.  Have leaves, snow, and ice cleared regularly.  Use sand or salt on walkways during winter months.  In the garage, clean up grease or oil spills. BATHROOM  Install night lights.  Install grab bars by the toilet and in the tub and shower.  Use non-skid mats or decals in the tub or shower.  Place a plastic non-slip stool in the shower to sit on, if needed.  Keep floors dry and clean up all water on the floor immediately.  Remove soap buildup in the tub or shower on a regular basis.  Secure bath mats with non-slip, double-sided rug tape.  Remove throw rugs and tripping hazards from the floors. BEDROOMS  Install night lights.  Make sure a bedside light is easy to reach.  Do not use oversized bedding.  Keep a telephone by your bedside.  Have a firm chair with side arms to use for getting dressed.  Remove throw rugs and tripping hazards from the floor. KITCHEN  Keep handles on pots and pans turned toward the center of the stove. Use back burners when possible.  Clean up spills quickly and allow time for drying.  Avoid walking on wet floors.  Avoid hot utensils and knives.  Position shelves so they are not  too high or low.  Place commonly used objects within easy reach.  If necessary, use a sturdy step stool with a grab bar when reaching.  Keep electrical cables out of the way.  Do not use floor polish or wax that makes floors slippery. If you must use wax, use non-skid floor wax.  Remove throw rugs and tripping hazards from the floor. STAIRWAYS  Never leave objects on stairs.  Place handrails on both sides of stairways and use them. Fix any loose handrails. Make sure handrails on both sides of the stairways are as long as the stairs.  Check carpeting to make sure it is firmly attached along stairs. Make repairs to worn or loose carpet promptly.  Avoid placing throw rugs at the top or bottom of stairways, or properly secure the rug with carpet tape to prevent slippage. Get rid of throw rugs, if possible.  Have an electrician put in a light switch at the top and bottom of the stairs. OTHER FALL PREVENTION TIPS  Wear low-heel or rubber-soled shoes that are supportive and fit well. Wear closed toe shoes.  When using a stepladder, make sure it is fully opened and both spreaders are firmly locked. Do not climb a closed stepladder.  Add color or contrast paint or tape to grab bars and handrails in your home. Place contrasting color strips on first and last steps.  Learn and use  mobility aids as needed. Install an electrical emergency response system.  Turn on lights to avoid dark areas. Replace light bulbs that burn out immediately. Get light switches that glow.  Arrange furniture to create clear pathways. Keep furniture in the same place.  Firmly attach carpet with non-skid or double-sided tape.  Eliminate uneven floor surfaces.  Select a carpet pattern that does not visually hide the edge of steps.  Be aware of all pets. OTHER HOME SAFETY TIPS  Set the water temperature for 120 F (48.8 C).  Keep emergency numbers on or near the telephone.  Keep smoke detectors on every  level of the home and near sleeping areas. Document Released: 03/28/2002 Document Revised: 10/07/2011 Document Reviewed: 06/27/2011 [facility] Patient Information 2015 [place], [place]. This information is not intended to replace advice given to you by your health care provider. Make sure you discuss any questions you have with your health care provider.   Preventive Care for Adults Ages 18 and over  Blood pressure check.** / Every 1 to 2 years.  Lipid and cholesterol check.**/ Every 5 years beginning at age 28.  Lung cancer screening. / Every year if you are aged 65-80 years and have a 30-pack-year history of smoking and currently smoke or have quit within the past 15 years. Yearly screening is stopped once you have quit smoking for at least 15 years or develop a health problem that would prevent you from having lung cancer treatment.  Fecal occult blood test (FOBT) of stool. / Every year beginning at age 51 and continuing until age 30. You may not have to do this test if you get a colonoscopy every 10 years.  Flexible sigmoidoscopy** or colonoscopy.** / Every 5 years for a flexible sigmoidoscopy or every 10 years for a colonoscopy beginning at age 44 and continuing until age 20.  Hepatitis C blood test.** / For all people born from [birth year] through 1963 and any individual with known risks for hepatitis C.  Abdominal aortic aneurysm (AAA) screening.** / A one-time screening for ages 14 to 78 years who are current or former smokers.  Skin self-exam. / Monthly.  Influenza vaccine. / Every year.  Tetanus, diphtheria, and acellular pertussis (Tdap/Td) vaccine.** / 1 dose of Td every 10 years.  Varicella vaccine.** / Consult your health care provider.  Zoster vaccine.** / 1 dose for adults aged 53 years or older.  Pneumococcal 13-valent conjugate (PCV13) vaccine.** / Consult your health care provider.  Pneumococcal polysaccharide (PPSV23) vaccine.** / 1 dose for all adults aged 26 years and  older.  Meningococcal vaccine.** / Consult your health care provider.  Hepatitis A vaccine.** / Consult your health care provider.  Hepatitis B vaccine.** / Consult your health care provider.  Haemophilus influenzae type b (Hib) vaccine.** / Consult your health care provider. **Family history and personal history of risk and conditions may change your health care provider's recommendations. Document Released: 06/03/2001 Document Revised: 04/12/2013 Document Reviewed: 09/02/2010 [facility] Patient Information 2015 [place], [place]. This information is not intended to replace advice given to you by your health care provider. Make sure you discuss any questions you have with your health care provider.  '

## 2015-10-03 NOTE — Progress Notes (Signed)
Subjective:    Patient ID: [name], female    DOB: 02-Aug-1927, 82 y.o.   MRN: [id number]  DOS:  10/03/2015 Type of visit - description : yearly Here for Medicare AWV:  1. Risk factors based on Past M, S, F history: reviewed 2. Physical Activities:  Sedentary d/t medical issues 3. Depression/mood: on meds, sx controlled 4. Hearing:  No problems noted or reported  5. ADL's: independent, drives  6. Fall Risk: no recent falls, prevention discussed , see AVS 7. home Safety: does feel safe at home  8. Height, weight, & visual acuity: see VS, sees eye doctor regulalrly, vision better after eye surgery 08-2015 9. Counseling: provided 10. Labs ordered based on risk factors: if needed  11. Referral Coordination: if needed 12. Care Plan, see assessment and plan , written personalized plan provided , see AVS 13. Cognitive Assessment: motor skills and cognition appropriate for age 25. Care team updated  15. End-of-life care: has a HC-POA  In addition, today we discussed the following: Atrial fibrillation: Currently asymptomatic COPD: Excellent control, uses Symbicort once daily. Anxiety: Good med compliance , symptoms controlled Mild cognitive impairment: Status post neuropsychiatry evaluation   Review of Systems  Constitutional: No fever. No chills. No unexplained wt changes. No unusual sweats  HEENT: No dental problems, no ear discharge, no facial swelling, no voice changes. No eye discharge, no eye  redness , no  intolerance to light   Respiratory: No wheezing , no  difficulty breathing. Rarely has cough  , no mucus production  Cardiovascular: No CP, no leg swelling , no  Palpitations  GI: no nausea, no vomiting,   no  abdominal pain. Diarrhea most days, a chronic issue for years No blood in the stools. No dysphagia, no odynophagia    Endocrine: No polyphagia, no polyuria , no polydipsia  GU: No dysuria, gross hematuria, difficulty urinating. No urinary urgency, no  frequency.  Musculoskeletal: No joint swellings or unusual aches or pains  Skin: No change in the color of the skin, palor , no  Rash  Allergic, immunologic:   environmental allergies controlled with OTC antihistaminics, no  food allergies  Neurological: No dizziness no  syncope. No headaches. No diplopia, no slurred, no slurred speech, no motor deficits, no facial  Numbness  Hematological: No enlarged lymph nodes, no easy bruising , no unusual bleedings  Psychiatry: No suicidal ideas, no hallucinations, no beavior problems, no confusion.   Past Medical History  Diagnosis Date  . Arthritis   . Hyperlipidemia   . 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 Chronic constipation   . Hemorrhoid   . Varicose veins   . Anxiety   . Depression   . GERD (gastroesophageal reflux disease)   . Atrial fibrillation ([place])   . Recurrent UTI   . Parkinson disease ([place])   . Cardiac arrhythmia due to congenital heart disease   . Mycotic toenails 10/27/2012  . COPD (chronic obstructive pulmonary disease) ([place])   . Parkinsonism [facility])     Past Surgical History  Procedure Laterality Date  . Appendectomy  82 years old  . Colon resection  2008  . Vitriectomy  08-2015    Social History   Social History  . Marital Status: Widowed    Spouse Name: N/A  . Number of Children: 2  . Years of Education: N/A   Occupational History  . retired- Ambulance person , elementary school [employer]  . Smoking status:  Former Smoker -- 2.00 packs/day for 30 years    Types: Cigarettes    Quit date: 04/21/1980  . Smokeless tobacco: Never Used     Comment: onset age 89 -33, up to > 1ppd (almost 2 ppd)  . Alcohol Use: No  . Drug Use: No  . Sexual Activity: No   Other Topics Concern  . Not on file   Social History Narrative   Widowed, lives alone. 1 child in [place] and 1 in [place].   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     Family History  Problem Relation Age of Onset  . Asthma Brother     . Alcohol abuse Brother   . Throat cancer Father   . Alcohol abuse Father   . Lupus Daughter   . Bipolar disorder Daughter   . Lupus Daughter   . Anxiety disorder Daughter   . Alcohol abuse Sister   . Alcohol abuse Maternal Grandfather   . Alcohol abuse Paternal Grandmother   . Colon cancer Neg Hx   . Breast cancer Sister        Medication List       This list is accurate as of: 10/03/15 11:59 PM.  Always use your most recent med list.               albuterol 108 (90 Base) MCG/ACT inhaler  Commonly known as:  VENTOLIN HFA  Inhale 2 puffs into the lungs 2 (two) times daily as needed for wheezing or shortness of breath.     apixaban 5 MG Tabs tablet  Commonly known as:  ELIQUIS  Take 5 mg by mouth 2 (two) times daily.     azelastine 0.1 % nasal spray  Commonly known as:  ASTELIN  Place 2 sprays into both nostrils at bedtime as needed for rhinitis. Use in each nostril as directed     budesonide-formoterol 160-4.5 MCG/ACT inhaler  Commonly known as:  SYMBICORT  Inhale 2 puffs into the lungs 2 (two) times daily.     CALCIUM 1000 + D PO  Take 1,000 mg by mouth daily.     celecoxib 100 MG capsule  Commonly known as:  CELEBREX  Take 100 mg by mouth daily as needed (back pain).     diltiazem 30 MG tablet  Commonly known as:  CARDIZEM  Take 1 tablet (30 mg total) by mouth 4 (four) times daily. Take at onset on atrial fibrillation     flecainide 100 MG tablet  Commonly known as:  TAMBOCOR  Take 50 mg by mouth daily as needed. Reported on 05/26/2015     sertraline 100 MG tablet  Commonly known as:  ZOLOFT  Take 1.5 tablets (150 mg total) by mouth daily.     trimethoprim 100 MG tablet  Commonly known as:  TRIMPEX  Take 100 mg by mouth daily. Reported on 10/03/2015     vitamin B-12 1000 MCG tablet  Commonly known as:  CYANOCOBALAMIN  Take 1,000 mcg by mouth daily.     vitamin C 1000 MG tablet  Take 1,000 mg by mouth daily.           Objective:   Physical  Exam BP 124/76 mmHg  Pulse 81  Temp(Src) 98 F (36.7 C) (Oral)  Ht '5\' 10"'$  (1.778 m)  Wt 154 lb (69.854 kg)  BMI 22.10 kg/m2  SpO2 99%  General:   Well developed, well nourished . NAD.  Neck: No  thyromegaly  HEENT:  Normocephalic . Face symmetric,  atraumatic Lungs:  CTA B Normal respiratory effort, no intercostal retractions, no accessory muscle use. Heart: Irregularly irregular, rate around 80. no murmur.  No pretibial edema bilaterally  Abdomen:  Not distended, soft, non-tender. No rebound or rigidity.   Skin: Exposed areas without rash. Not pale. Not jaundice Neurologic:  alert & oriented X3.  Speech normal, gait appropriate for age and unassisted Strength symmetric and appropriate for age.  Psych: Cognition and judgment appear intact.  Cooperative with normal attention span and concentration.  Behavior appropriate. slightly apprehensive, at baseline, no depression     Assessment & Plan:   Assessment   A1c 5.7 01-2015 Hyperlipidemia Anxiety depression  Dr Ferdinand Lango (psychology), meds rx by pcp COPD   P-Atrial fibrillation Dr Gerrit Friends, flecanaide prn /cardiazem prn GI: --GERD --Abnormal abdominal US mild pancreatic duct enlargement  --IBS used to be constipated, now diarrhea predominant --dysphagia MBE 02-22-15 wnl DJD--- take Celebrex very seldom Recurrent UTIs -- qd abx , has seenn urology Dr Edwena Blow before NEURO: Parkinsonism ---  Dr Tat Gait d/o (multifactorial likely) Peripheral neuropathy (pr neuro note 04-2015) mild cognitive impairmentairment (see neuro note 04-2015), dx confirmed by neuropsychiatry evaluation 07-2015 admitted  01-2015: COPD and RVR   PLAN   Hyperlipidemia: diet control, check labs Anxiety depression: Controlled on meds Atrial fibrillation, paroxysmal: Seems to be in A. fib today, asx, continue anticoagulation and flecainide/Cardizem as needed DJD: on Celebrex, take it very seldom (every few months) Mild cognitive impairment: Seems to  be doing very well. RTC 6 months

## 2015-10-03 NOTE — Assessment & Plan Note (Addendum)
Td 2014, PNM 2015; prevnar 2015; zostavax 2016  Female care:  Pap-- 09/26/14 w/ Dr. Evette Cristal at [facility]; unsatisfactory for evaluation due to obscuring inflammation and extremely scant cellularity; repeat study recommended>>> offered referral  MMG 06-2015 Bone Density-- 05/25/12 w/ [facility]; Left Forearm (Radius) T-score -1.0 CCS: 05/07/09 w/ Dr. Norberto Sorenson T. Stark   normal colon; internal hemorrhoids; GI follow-up as needed. Diet- discussed

## 2015-10-04 ENCOUNTER — Ambulatory Visit (INDEPENDENT_AMBULATORY_CARE_PROVIDER_SITE_OTHER): Payer: Medicare Other | Admitting: Psychiatry

## 2015-10-04 DIAGNOSIS — F411 Generalized anxiety disorder: Secondary | ICD-10-CM | POA: Diagnosis not present

## 2015-10-04 LAB — BASIC METABOLIC PANEL
BUN: 19 mg/dL (ref 6–23)
CO2: 31 mEq/L (ref 19–32)
CREATININE: 0.98 mg/dL (ref 0.40–1.20)
Calcium: 9.7 mg/dL (ref 8.4–10.5)
Chloride: 102 mEq/L (ref 96–112)
GFR: 57.23 mL/min — AB (ref >60.00)
GLUCOSE: 142 mg/dL — AB (ref 70–99)
Potassium: 4.1 mEq/L (ref 3.5–5.1)
Sodium: 139 mEq/L (ref 135–145)

## 2015-10-04 LAB — CBC WITH DIFFERENTIAL/PLATELET
BASOS PCT: 0.6 % (ref 0.0–3.0)
Basophils Absolute: 0 10*3/uL (ref 0.0–0.1)
EOS PCT: 2 % (ref 0.0–5.0)
Eosinophils Absolute: 0.1 10*3/uL (ref 0.0–0.7)
HEMATOCRIT: 37.9 % (ref 36.0–46.0)
HEMOGLOBIN: 12.5 g/dL (ref 12.0–15.0)
Lymphocytes Relative: 28.6 % (ref 12.0–46.0)
Lymphs Abs: 1.9 10*3/uL (ref 0.7–4.0)
MCHC: 33 g/dL (ref 30.0–36.0)
MCV: 91.8 fl (ref 78.0–100.0)
MONO ABS: 0.5 10*3/uL (ref 0.1–1.0)
Monocytes Relative: 7.1 % (ref 3.0–12.0)
NEUTROS ABS: 4.1 10*3/uL (ref 1.4–7.7)
Neutrophils Relative %: 61.7 % (ref 43.0–77.0)
PLATELETS: 198 10*3/uL (ref 150.0–400.0)
RBC: 4.13 Mil/uL (ref 3.87–5.11)
RDW: 14.4 % (ref 11.5–15.5)
WBC: 6.7 10*3/uL (ref 4.0–10.5)

## 2015-10-04 LAB — LIPID PANEL
CHOL/HDL RATIO: 2
Cholesterol: 144 mg/dL (ref 0–200)
HDL: 62 mg/dL (ref >39.00)
LDL CALC: 59 mg/dL (ref 0–99)
NonHDL: 81.5
Triglycerides: 111 mg/dL (ref 0.0–149.0)
VLDL: 22.2 mg/dL (ref 0.0–40.0)

## 2015-10-04 NOTE — Assessment & Plan Note (Signed)
Hyperlipidemia: diet control, check labs Anxiety depression: Controlled on meds Atrial fibrillation, paroxysmal: Seems to be in A. fib today, asx, continue anticoagulation and flecainide/Cardizem as needed DJD: on Celebrex, take it very seldom (every few months) Mild cognitive impairment: Seems to be doing very well. RTC 6 months

## 2015-10-12 DIAGNOSIS — H35373 Puckering of macula, bilateral: Secondary | ICD-10-CM | POA: Diagnosis not present

## 2015-10-12 DIAGNOSIS — H3581 Retinal edema: Secondary | ICD-10-CM | POA: Diagnosis not present

## 2015-10-25 ENCOUNTER — Telehealth: Payer: Self-pay | Admitting: Neurology

## 2015-10-25 ENCOUNTER — Ambulatory Visit: Payer: Medicare Other | Attending: Neurology | Admitting: Physical Therapy

## 2015-10-25 DIAGNOSIS — R2681 Unsteadiness on feet: Secondary | ICD-10-CM | POA: Diagnosis not present

## 2015-10-25 DIAGNOSIS — R293 Abnormal posture: Secondary | ICD-10-CM | POA: Diagnosis not present

## 2015-10-25 DIAGNOSIS — G214 Vascular parkinsonism: Secondary | ICD-10-CM

## 2015-10-25 DIAGNOSIS — R2689 Other abnormalities of gait and mobility: Secondary | ICD-10-CM | POA: Diagnosis not present

## 2015-10-25 NOTE — Telephone Encounter (Signed)
Did she do neuropsych?  Was to f/u after that and not sure if she did it??

## 2015-10-25 NOTE — Telephone Encounter (Signed)
She didn't have a neurodegen d/o, so she doesn't have to but if I am going to monitor/order PT in future, then we should periodically f/u with her

## 2015-10-25 NOTE — Therapy (Signed)
[facility] [address] [place], [place], [address] Phone: [phone]   Fax:  [phone]  Physical Therapy Evaluation  Patient Details  Name: [name] MRN: [id number] Date of Birth: January 17, 1928 Referring Provider: Kerin Salen, DO  Encounter Date: 10/25/2015      PT End of Session - 10/25/15 1750    Visit Number 1  New episode of care 10/25/15   Number of Visits 9   Date for PT Re-Evaluation 12/25/15   Authorization Type Medicare-Gcode every 10th visit; BCBS secondary   PT Start Time 0935   PT Stop Time 1013   PT Time Calculation (min) 38 min   Activity Tolerance Patient tolerated treatment well   Behavior During Therapy [facility] for tasks assessed/performed      Past Medical History  Diagnosis Date  . Arthritis   . Hyperlipidemia   . 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 Chronic constipation   . Hemorrhoid   . Varicose veins   . Anxiety   . Depression   . GERD (gastroesophageal reflux disease)   . Atrial fibrillation (HCC)   . Recurrent UTI   . Parkinson disease (HCC)   . Cardiac arrhythmia due to congenital heart disease   . Mycotic toenails 10/27/2012  . COPD (chronic obstructive pulmonary disease) (HCC)   . Parkinsonism [facility])     Past Surgical History  Procedure Laterality Date  . Appendectomy  82 years old  . Colon resection  2008  . Vitriectomy  08-2015    There were no vitals filed for this visit.       Subjective Assessment - 10/25/15 0937    Subjective Pt returns for PT evaluation today.  Have not been able to exercise due to surgery on L eye for "macular pucker"-MD had told me not to exercise for a month, but I'm okay to get back to exercise now.  No falls in the past 6 months.   Patient Stated Goals Pt's goal for therapy would be balance and walking.   Currently in Pain? Yes   Pain Score --  Pt under MD care-pt to call   Pain Location Eye   Pain Orientation Left             [facility] PT Assessment - 10/25/15 0941    Assessment   Medical Diagnosis Parkinson's disease   Referring Provider Lurena Joiner Tat, DO   Precautions   Precautions Fall   Precaution Comments L eye blurred vision due to macular pucker-surgery 09/12/15-was not to exercise for 1 month; cleared for any activity/exercise at this time.   Balance Screen   Has the patient fallen in the past 6 months No   Has the patient had a decrease in activity level because of a fear of falling?  Yes   Is the patient reluctant to leave their home because of a fear of falling?  No   Home Environment   Living Environment Private residence   Living Arrangements Alone   Available Help at Discharge Friend(s)   Type of Home House  Townhouse with 2 floors   Home Access Stairs to enter   Entrance Stairs-Number of Steps 2   Entrance Stairs-Rails Left   Home Layout Two level;Bed/bath upstairs   Home Equipment [place] - single point   Prior Function   Vocation Retired   Leisure Enjoys exercising at [employer]; has not in >1 month; would like to plan for [employer] of Balance and Tai Chi   Posture/Postural  Control   Posture/Postural Control Postural limitations   Postural Limitations Rounded Shoulders;Forward head;Flexed trunk   ROM / Strength   AROM / PROM / Strength Strength   Strength   Overall Strength Comments grossly tested at least 4/5 bilateral lower extremities   Transfers   Transfers Sit to Stand;Stand to Sit   Sit to Stand 7: Independent;Without upper extremity assist;From chair/3-in-1   Five time sit to stand comments  13.86  not standing with knees fully extended   Stand to Sit 7: Independent;To chair/3-in-1;Without upper extremity assist   Comments Reports increased difficulty with getting up from sofa at home   Ambulation/Gait   Ambulation/Gait Yes   Ambulation/Gait Assistance 6: Modified independent (Device/Increase time)   Ambulation Distance (Feet) 300 Feet   Assistive device Straight cane;None    Gait Pattern Step-through pattern;Decreased step length - right;Decreased step length - left;Narrow base of support   Ambulation Surface Level;Indoor   Gait velocity 11.91 sec = 2.75 ft/sec   Timed Up and Go Test   Normal TUG (seconds) 15.1  cane   Manual TUG (seconds) 15.44  cane   Cognitive TUG (seconds) 14.21  cane   Functional Gait  Assessment   Gait assessed  Yes   Gait Level Surface Walks 20 ft, slow speed, abnormal gait pattern, evidence for imbalance or deviates 10-15 in outside of the 12 in walkway width. Requires more than 7 sec to ambulate 20 ft.  7.62 sec with cane   Change in Gait Speed Able to change speed, demonstrates mild gait deviations, deviates 6-10 in outside of the 12 in walkway width, or no gait deviations, unable to achieve a major change in velocity, or uses a change in velocity, or uses an assistive device.   Gait with Horizontal Head Turns Performs head turns smoothly with slight change in gait velocity (eg, minor disruption to smooth gait path), deviates 6-10 in outside 12 in walkway width, or uses an assistive device.   Gait with Vertical Head Turns Performs task with slight change in gait velocity (eg, minor disruption to smooth gait path), deviates 6 - 10 in outside 12 in walkway width or uses assistive device   Gait and Pivot Turn Pivot turns safely within 3 sec and stops quickly with no loss of balance.   Step Over Obstacle Is able to step over one shoe box (4.5 in total height) but must slow down and adjust steps to clear box safely. May require verbal cueing.   Gait with Narrow Base of Support Ambulates less than 4 steps heel to toe or cannot perform without assistance.   Gait with Eyes Closed Cannot walk 20 ft without assistance, severe gait deviations or imbalance, deviates greater than 15 in outside 12 in walkway width or will not attempt task.   Ambulating Backwards Walks 20 ft, slow speed, abnormal gait pattern, evidence for imbalance, deviates 10-15 in  outside 12 in walkway width.  30.28   Steps Alternating feet, must use rail.   Total Score 14   FGA comment: Score has decreased from 16/30 at last PT bout of therapy.  Scores <15/30 indicate increased fall risk.                                PT Long Term Goals - 10/25/15 1756    PT LONG TERM GOAL #1   Title Pt will be independent with progressive HEP to address balance, strength, posture and  gait.  TARGET 11/24/15   Time 4   Period Weeks   Status New   PT LONG TERM GOAL #2   Title Pt will improve TUG score to less than or equal to 13.5 seconds for decreased fall risk.   Time 4   Period Weeks   Status New   PT LONG TERM GOAL #3   Title Pt will improve Functional Gait Assessment score to at least 16/30 for decreased fall risk.   Time 4   Period Weeks   Status New   PT LONG TERM GOAL #4   Title Pt will verbalize plans for continued community fitness upon D/C from PT.   Time 4   Period Weeks   Status New   PT LONG TERM GOAL #5   Title Pt will verbalize understanding of fall prevention in the home environment.   Time 4   Period Weeks   Status New               Plan - 11-17-2015 1751    Clinical Impression Statement Pt is an 82 year old female with Parkinson's disease who presents to OP PT.  She was previously seen by this therapist, with discharge in January 2017; however, since that time, pt has had eye surgery and has been unable to exercise.  Pt feels this inability to exercise has led to decreased activitiy tolerance, increased fatigue, and desires to safely return to community fitness.  She presents with decreased balance, decreased lower extremity functional strength, decreased timing and coordination with gait.  Overall, her TUG scoresa nd Functional Gait Assessment scores have declined since previous bout of therapy, and pt is at fall risk per those scores.  Pt will benefit from skilled physical therapy to address updates to current HEP for  balance, strength and gait, with education on safe progression to community fitness options.   Rehab Potential Good   PT Frequency 2x / week   PT Duration 4 weeks  plus eval   PT Treatment/Interventions ADLs/Self Care Home Management;Therapeutic exercise;Therapeutic activities;Functional mobility training;Gait training;Balance training;Neuromuscular re-education;Patient/family education   PT Next Visit Plan Review pt's previous HEP and update as needed for functional strength, posture, and balance; discuss options for community fitness   Consulted and Agree with Plan of Care Patient      Patient will benefit from skilled therapeutic intervention in order to improve the following deficits and impairments:  Abnormal gait, Decreased activity tolerance, Difficulty walking, Decreased mobility, Decreased strength, Decreased coordination, Postural dysfunction  Visit Diagnosis: Other abnormalities of gait and mobility  Unsteadiness on feet  Abnormal posture      G-Codes - 17-Nov-2015 1759    Functional Assessment Tool Used Functional Gait Assessment 14/30, TUG 15.1 sec, TUG manual 15.44 sec, 5x sit<>stand 13.81 sec   Functional Limitation Mobility: Walking and moving around   Mobility: Walking and Moving Around Current Status [phone]) At least 20 percent but less than 40 percent impaired, limited or restricted   Mobility: Walking and Moving Around Goal Status [phone]) At least 1 percent but less than 20 percent impaired, limited or restricted       Problem List Patient Active Problem List   Diagnosis Date Noted  . Acute bacterial bronchitis 07/01/2015  . Arterial hypotension 06/18/2015  . Influenza 06/13/2015  . PCP NOTES >>>>>>>>>>>>>>>>>>>>>>> 03/02/2015  . Atrial fibrillation with RVR (HCC) 01/31/2015  . Elevated troponin 01/31/2015  . COPD (chronic obstructive pulmonary disease) (HCC) 01/31/2015  . Lower abdominal pain 12/05/2014  .  Other fatigue 06/21/2014  . Lightheadedness  06/12/2014  . Fall from other slipping, tripping, or stumbling 11/17/2013  . Concussion without loss of consciousness 11/17/2013  . COPD exacerbation ([place]) 09/26/2013  . Sinusitis, acute maxillary 07/19/2012  . Musculoskeletal pain 05/21/2012  . IBS (irritable bowel syndrome) 04/02/2012  . Ear bleeding 12/10/2011  . Physical exam, annual 11/25/2011  . Generalized anxiety disorder 07/10/2011  . Parkinson's disease ([place]) 02/03/2011  . Shoulder pain 02/03/2011  . Recurrent UTI 11/01/2010  . GERD (gastroesophageal reflux disease) 11/01/2010  . Memory loss 11/01/2010  . Hip pain, right 09/23/2010  . COMPRESSION FRACTURE, LUMBAR VERTEBRAE 03/28/2010  . Abdominal pain 03/27/2010  . DIZZINESS 03/22/2010  . Mycotic toenails 10/02/2009  . DIARRHEA-PRESUMED INFECTIOUS 04/02/2009  . DYSPNEA ON EXERTION 01/19/2009  . HEMORRHOIDS-INTERNAL 02/14/2008  . CONSTIPATION 02/14/2008  . PERSONAL HX COLONIC POLYPS 02/14/2008    [place] Pacella W. 10/25/2015, 6:01 PM  Frazier Butt., PT  [facility] [address] [place], [place], [address] Phone: [phone]   Fax:  [phone]  Name: [name] MRN: [id number] Date of Birth: 13-Apr-1928

## 2015-10-25 NOTE — Telephone Encounter (Signed)
Neuro psych results in EPIC. Left message for patient to call back and schedule follow up appt.

## 2015-10-25 NOTE — Telephone Encounter (Signed)
-----   Message from Frazier Butt, PT sent at 10/25/2015  9:12 AM EDT ----- Regarding: Referral for PT Dr. Daphine Deutscher is scheduled for a return PT evaluation, which was agreed upon at her discharge from therapy in January.  Could you please send a referral for PT evaluate and treat via EPIC (ASAP) to cover this evaluation?  Thank you so much, and I apologize for the last minute request.  Thank you.  Mady Haagensen, PT

## 2015-10-25 NOTE — Telephone Encounter (Signed)
Order entered

## 2015-10-31 ENCOUNTER — Ambulatory Visit: Payer: Medicare Other | Admitting: Physical Therapy

## 2015-10-31 DIAGNOSIS — R293 Abnormal posture: Secondary | ICD-10-CM | POA: Diagnosis not present

## 2015-10-31 DIAGNOSIS — R2681 Unsteadiness on feet: Secondary | ICD-10-CM | POA: Diagnosis not present

## 2015-10-31 DIAGNOSIS — R2689 Other abnormalities of gait and mobility: Secondary | ICD-10-CM

## 2015-10-31 NOTE — Therapy (Signed)
[facility] [address] [place], [place], [address] Phone: [phone]   Fax:  [phone]  Physical Therapy Treatment  Patient Details  Name: [name] MRN: [id number] Date of Birth: 18-Sep-1927 Referring Provider: Alonza Bogus, DO  Encounter Date: 10/31/2015      PT End of Session - 10/31/15 0934    Visit Number 2  New episode of care 10/25/15   Number of Visits 9   Date for PT Re-Evaluation 12/25/15   Authorization Type Medicare-Gcode every 10th visit; BCBS secondary   PT Start Time 0845   PT Stop Time 0932   PT Time Calculation (min) 47 min   Activity Tolerance Patient tolerated treatment well   Behavior During Therapy [facility] for tasks assessed/performed      Past Medical History  Diagnosis Date  . Arthritis   . Hyperlipidemia   . 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 Chronic constipation   . Hemorrhoid   . Varicose veins   . Anxiety   . Depression   . GERD (gastroesophageal reflux disease)   . Atrial fibrillation ([place])   . Recurrent UTI   . Parkinson disease ([place])   . Cardiac arrhythmia due to congenital heart disease   . Mycotic toenails 10/27/2012  . COPD (chronic obstructive pulmonary disease) ([place])   . Parkinsonism [facility])     Past Surgical History  Procedure Laterality Date  . Appendectomy  82 years old  . Colon resection  2008  . Vitriectomy  08-2015    There were no vitals filed for this visit.      Subjective Assessment - 10/31/15 0849    Subjective Reports having trouble getting back into doing HEP consistently. " I feel tired all the time."   Patient Stated Goals Pt's goal for therapy would be balance and walking.   Currently in Pain? No/denies                                 PT Education - 10/31/15 1151    Education provided Yes   Education Details Pt brought in her previous HEPs- assited pt in organizing HEPs, understand how to have a routine  that incorporates walking, posture and balance aspects of HEPs that is reasonable so pt can perform consisitently.  Reviewed SPC for correct cane height.   Person(s) Educated Patient   Methods Explanation;Verbal cues;Handout   Comprehension Verbalized understanding;Verbal cues required          PT Short Term Goals - 04/26/15 1209    PT SHORT TERM GOAL #1   Title Pt will perform HEP independently, to address Parkinson's specific deficits, for improved functional mobility.  TARGET 04/06/15   Time 4   Period Weeks   Status Partially Met   PT SHORT TERM GOAL #2   Title Pt will perform sit<>stand transfers from 18 inches or below, at least 8 of 10 trials without UE support, for improved transfer efficiency, safety and functional strength.   Time 4   Period Weeks   Status Achieved   PT SHORT TERM GOAL #3   Title Pt will improve TUG score to less than or equal to 13.5 seconds for improved turns with gait and decreased fall risk.   Baseline 14.07 sec (improved from 14.77 sec)-04/06/15   Time 4   Period Weeks   Status Not Met   PT SHORT TERM GOAL #4   Title Pt will  improve Functional Gait Assessment to at least 16/30 for decreased fall risk.   Time 4   Period Weeks   Status Not Met   PT SHORT TERM GOAL #5   Title Pt will verbalize understanding of local Parkinson's disease resources.   Baseline Discussed Power over Parkinson's community group 04/26/15   Time 4   Period Weeks   Status Achieved           PT Long Term Goals - 10/25/15 1756    PT LONG TERM GOAL #1   Title Pt will be independent with progressive HEP to address balance, strength, posture and gait.  TARGET 11/24/15   Time 4   Period Weeks   Status New   PT LONG TERM GOAL #2   Title Pt will improve TUG score to less than or equal to 13.5 seconds for decreased fall risk.   Time 4   Period Weeks   Status New   PT LONG TERM GOAL #3   Title Pt will improve Functional Gait Assessment score to at least 16/30 for decreased  fall risk.   Time 4   Period Weeks   Status New   PT LONG TERM GOAL #4   Title Pt will verbalize plans for continued community fitness upon D/C from PT.   Time 4   Period Weeks   Status New   PT LONG TERM GOAL #5   Title Pt will verbalize understanding of fall prevention in the home environment.   Time 4   Period Weeks   Status New               Plan - 10/31/15 1155    Clinical Impression Statement Pt felt that the instruction on how to organize her HEPs and how to come up with a reasonable plan to perform consistently was helpful and verbalized understanding.   Rehab Potential Good   PT Frequency 2x / week   PT Duration 4 weeks  plus eval   PT Treatment/Interventions ADLs/Self Care Home Management;Therapeutic exercise;Therapeutic activities;Functional mobility training;Gait training;Balance training;Neuromuscular re-education;Patient/family education   PT Next Visit Plan Review pt's previous HEP and update as needed for functional strength, posture, and balance; discuss options for community fitness   Consulted and Agree with Plan of Care Patient      Patient will benefit from skilled therapeutic intervention in order to improve the following deficits and impairments:  Abnormal gait, Decreased activity tolerance, Difficulty walking, Decreased mobility, Decreased strength, Decreased coordination, Postural dysfunction  Visit Diagnosis: Other abnormalities of gait and mobility  Unsteadiness on feet  Abnormal posture     Problem List Patient Active Problem List   Diagnosis Date Noted  . Acute bacterial bronchitis 07/01/2015  . Arterial hypotension 06/18/2015  . Influenza 06/13/2015  . PCP NOTES >>>>>>>>>>>>>>>>>>>>>>> 03/02/2015  . Atrial fibrillation with RVR ([employer]) 01/31/2015  . Elevated troponin 01/31/2015  . COPD (chronic obstructive pulmonary disease) ([place]) 01/31/2015  . Lower abdominal pain 12/05/2014  . Other fatigue 06/21/2014  . Lightheadedness  06/12/2014  . Fall from other slipping, tripping, or stumbling 11/17/2013  . Concussion without loss of consciousness 11/17/2013  . COPD exacerbation ([place]) 09/26/2013  . Sinusitis, acute maxillary 07/19/2012  . Musculoskeletal pain 05/21/2012  . IBS (irritable bowel syndrome) 04/02/2012  . Ear bleeding 12/10/2011  . Physical exam, annual 11/25/2011  . Generalized anxiety disorder 07/10/2011  . Parkinson's disease ([place]) 02/03/2011  . Shoulder pain 02/03/2011  . Recurrent UTI 11/01/2010  . GERD (gastroesophageal reflux disease) 11/01/2010  .  Memory loss 11/01/2010  . Hip pain, right 09/23/2010  . COMPRESSION FRACTURE, LUMBAR VERTEBRAE 03/28/2010  . Abdominal pain 03/27/2010  . DIZZINESS 03/22/2010  . Mycotic toenails 10/02/2009  . DIARRHEA-PRESUMED INFECTIOUS 04/02/2009  . DYSPNEA ON EXERTION 01/19/2009  . HEMORRHOIDS-INTERNAL 02/14/2008  . CONSTIPATION 02/14/2008  . PERSONAL HX COLONIC POLYPS 02/14/2008    Bjorn Loser, PTA  10/31/2015, 11:57 AM [facility] [address] [place], [place], [address] Phone: [phone]   Fax:  [phone]  Name: [name] MRN: [id number] Date of Birth: 1927-09-29    s

## 2015-11-01 ENCOUNTER — Ambulatory Visit: Payer: Medicare Other | Admitting: Physical Therapy

## 2015-11-01 DIAGNOSIS — R350 Frequency of micturition: Secondary | ICD-10-CM | POA: Diagnosis not present

## 2015-11-01 DIAGNOSIS — N3945 Continuous leakage: Secondary | ICD-10-CM | POA: Diagnosis not present

## 2015-11-01 DIAGNOSIS — R2681 Unsteadiness on feet: Secondary | ICD-10-CM | POA: Diagnosis not present

## 2015-11-01 DIAGNOSIS — R351 Nocturia: Secondary | ICD-10-CM | POA: Diagnosis not present

## 2015-11-01 DIAGNOSIS — R2689 Other abnormalities of gait and mobility: Secondary | ICD-10-CM | POA: Diagnosis not present

## 2015-11-01 DIAGNOSIS — Z01419 Encounter for gynecological examination (general) (routine) without abnormal findings: Secondary | ICD-10-CM | POA: Diagnosis not present

## 2015-11-01 DIAGNOSIS — R293 Abnormal posture: Secondary | ICD-10-CM | POA: Diagnosis not present

## 2015-11-01 DIAGNOSIS — Z6821 Body mass index (BMI) 21.0-21.9, adult: Secondary | ICD-10-CM | POA: Diagnosis not present

## 2015-11-01 NOTE — Therapy (Signed)
[facility] [address] [place], [place], [address] Phone: [phone]   Fax:  [phone]  Physical Therapy Treatment  Patient Details  Name: [name] MRN: [id number] Date of Birth: Dec 28, 1927 Referring Provider: Alonza Bogus, DO  Encounter Date: 11/01/2015      PT End of Session - 11/01/15 0930    Visit Number 3  New episode of care 10/25/15   Number of Visits 9   Date for PT Re-Evaluation 12/25/15   Authorization Type Medicare-Gcode every 10th visit; BCBS secondary   PT Start Time 0845   PT Stop Time 0928   PT Time Calculation (min) 43 min   Activity Tolerance Patient tolerated treatment well   Behavior During Therapy [facility] for tasks assessed/performed      Past Medical History  Diagnosis Date  . Arthritis   . Hyperlipidemia   . 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 Chronic constipation   . Hemorrhoid   . Varicose veins   . Anxiety   . Depression   . GERD (gastroesophageal reflux disease)   . Atrial fibrillation ([place])   . Recurrent UTI   . Parkinson disease ([place])   . Cardiac arrhythmia due to congenital heart disease   . Mycotic toenails 10/27/2012  . COPD (chronic obstructive pulmonary disease) ([place])   . Parkinsonism [facility])     Past Surgical History  Procedure Laterality Date  . Appendectomy  82 years old  . Colon resection  2008  . Vitriectomy  08-2015    There were no vitals filed for this visit.      Subjective Assessment - 11/01/15 0851    Subjective Pt worked out at the pool: walking, swimming, posture exercises.   Patient Stated Goals Pt's goal for therapy would be balance and walking.   Currently in Pain? No/denies                         [facility] Adult PT Treatment/Exercise - 11/01/15 0001    Ambulation/Gait   Ambulation/Gait Yes   Ambulation/Gait Assistance 6: Modified independent (Device/Increase time)   Ambulation/Gait Assistance Details working on arm  swing, step length, poosture and heel toe gait pattern  Min cues for above,and  for [facility] placement transitioning on t   Ambulation Distance (Feet) 1000 Feet   Assistive device Straight cane   Gait Pattern Step-through pattern;Decreased step length - right;Decreased step length - left;Narrow base of support   Ambulation Surface Level;Unlevel;Indoor;Outdoor;Paved;Gravel           P[facility]) - 11/01/15 0915 Standing   PWR! Up x10   PWR! Rock x10   PWR! Twist x10   PWR Step x10  Min Cues for posture and sequence             PT Education - 11/01/15 0929    Education provided Yes   Education Details Explained the balance, coordination, and postural components on PT session.   Person(s) Educated Patient   Methods Explanation;Verbal cues;Handout;Demonstration   Comprehension Verbalized understanding;Need further instruction          PT Short Term Goals - 04/26/15 1209    PT SHORT TERM GOAL #1   Title Pt will perform HEP independently, to address Parkinson's specific deficits, for improved functional mobility.  TARGET 04/06/15   Time 4   Period Weeks   Status Partially Met   PT SHORT TERM GOAL #2   Title Pt will perform sit<>stand transfers from 18  inches or below, at least 8 of 10 trials without UE support, for improved transfer efficiency, safety and functional strength.   Time 4   Period Weeks   Status Achieved   PT SHORT TERM GOAL #3   Title Pt will improve TUG score to less than or equal to 13.5 seconds for improved turns with gait and decreased fall risk.   Baseline 14.07 sec (improved from 14.77 sec)-04/06/15   Time 4   Period Weeks   Status Not Met   PT SHORT TERM GOAL #4   Title Pt will improve Functional Gait Assessment to at least 16/30 for decreased fall risk.   Time 4   Period Weeks   Status Not Met   PT SHORT TERM GOAL #5   Title Pt will verbalize understanding of local Parkinson's disease resources.   Baseline Discussed Power over Parkinson's  community group 04/26/15   Time 4   Period Weeks   Status Achieved           PT Long Term Goals - 10/25/15 1756    PT LONG TERM GOAL #1   Title Pt will be independent with progressive HEP to address balance, strength, posture and gait.  TARGET 11/24/15   Time 4   Period Weeks   Status New   PT LONG TERM GOAL #2   Title Pt will improve TUG score to less than or equal to 13.5 seconds for decreased fall risk.   Time 4   Period Weeks   Status New   PT LONG TERM GOAL #3   Title Pt will improve Functional Gait Assessment score to at least 16/30 for decreased fall risk.   Time 4   Period Weeks   Status New   PT LONG TERM GOAL #4   Title Pt will verbalize plans for continued community fitness upon D/C from PT.   Time 4   Period Weeks   Status New   PT LONG TERM GOAL #5   Title Pt will verbalize understanding of fall prevention in the home environment.   Time 4   Period Weeks   Status New               Plan - 11/01/15 0931    Clinical Impression Statement Practiced and instructed on  balance, coordination, and postural components of walking program and PWR! Moves in standing; pt demonstrated understanding   Rehab Potential Good   PT Frequency 2x / week   PT Duration 4 weeks  plus eval   PT Treatment/Interventions ADLs/Self Care Home Management;Therapeutic exercise;Therapeutic activities;Functional mobility training;Gait training;Balance training;Neuromuscular re-education;Patient/family education   PT Next Visit Plan Review pt's previous HEP and update as needed for functional strength, posture, and balance; discuss options for community fitness   Consulted and Agree with Plan of Care Patient      Patient will benefit from skilled therapeutic intervention in order to improve the following deficits and impairments:  Abnormal gait, Decreased activity tolerance, Difficulty walking, Decreased mobility, Decreased strength, Decreased coordination, Postural dysfunction  Visit  Diagnosis: Other abnormalities of gait and mobility  Unsteadiness on feet  Abnormal posture     Problem List Patient Active Problem List   Diagnosis Date Noted  . Acute bacterial bronchitis 07/01/2015  . Arterial hypotension 06/18/2015  . Influenza 06/13/2015  . PCP NOTES >>>>>>>>>>>>>>>>>>>>>>> 03/02/2015  . Atrial fibrillation with RVR ([place]) 01/31/2015  . Elevated troponin 01/31/2015  . COPD (chronic obstructive pulmonary disease) ([place]) 01/31/2015  . Lower abdominal pain 12/05/2014  .  Other fatigue 06/21/2014  . Lightheadedness 06/12/2014  . Fall from other slipping, tripping, or stumbling 11/17/2013  . Concussion without loss of consciousness 11/17/2013  . COPD exacerbation ([place]) 09/26/2013  . Sinusitis, acute maxillary 07/19/2012  . Musculoskeletal pain 05/21/2012  . IBS (irritable bowel syndrome) 04/02/2012  . Ear bleeding 12/10/2011  . Physical exam, annual 11/25/2011  . Generalized anxiety disorder 07/10/2011  . Parkinson's disease ([place]) 02/03/2011  . Shoulder pain 02/03/2011  . Recurrent UTI 11/01/2010  . GERD (gastroesophageal reflux disease) 11/01/2010  . Memory loss 11/01/2010  . Hip pain, right 09/23/2010  . COMPRESSION FRACTURE, LUMBAR VERTEBRAE 03/28/2010  . Abdominal pain 03/27/2010  . DIZZINESS 03/22/2010  . Mycotic toenails 10/02/2009  . DIARRHEA-PRESUMED INFECTIOUS 04/02/2009  . DYSPNEA ON EXERTION 01/19/2009  . HEMORRHOIDS-INTERNAL 02/14/2008  . CONSTIPATION 02/14/2008  . PERSONAL HX COLONIC POLYPS 02/14/2008    Bjorn Loser, PTA  11/01/2015, 12:57 PM [facility] [address] [place], [place], [address] Phone: [phone]   Fax:  [phone]  Name: [name] MRN: [id number] Date of Birth: 09/11/1927

## 2015-11-05 ENCOUNTER — Ambulatory Visit: Payer: Medicare Other | Admitting: Physical Therapy

## 2015-11-05 DIAGNOSIS — R2689 Other abnormalities of gait and mobility: Secondary | ICD-10-CM

## 2015-11-05 DIAGNOSIS — R2681 Unsteadiness on feet: Secondary | ICD-10-CM

## 2015-11-05 DIAGNOSIS — R293 Abnormal posture: Secondary | ICD-10-CM

## 2015-11-05 NOTE — Therapy (Signed)
[facility] [address] [place], [place], [address] Phone: [phone]   Fax:  [phone]  Physical Therapy Treatment  Patient Details  Name: [name] MRN: [id number] Date of Birth: 27-Dec-1927 Referring Provider: Alonza Bogus, DO  Encounter Date: 11/05/2015      PT End of Session - 11/05/15 1317    Visit Number 4  New episode of care 10/25/15   Number of Visits 9   Date for PT Re-Evaluation 12/25/15   Authorization Type Medicare-Gcode every 10th visit; BCBS secondary   PT Start Time 1237   PT Stop Time 1315   PT Time Calculation (min) 38 min   Equipment Utilized During Treatment Gait belt   Activity Tolerance Patient tolerated treatment well   Behavior During Therapy [facility] for tasks assessed/performed      Past Medical History  Diagnosis Date  . Arthritis   . Hyperlipidemia   . 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 Chronic constipation   . Hemorrhoid   . Varicose veins   . Anxiety   . Depression   . GERD (gastroesophageal reflux disease)   . Atrial fibrillation ([place])   . Recurrent UTI   . Parkinson disease ([place])   . Cardiac arrhythmia due to congenital heart disease   . Mycotic toenails 10/27/2012  . COPD (chronic obstructive pulmonary disease) ([place])   . Parkinsonism [facility])     Past Surgical History  Procedure Laterality Date  . Appendectomy  82 years old  . Colon resection  2008  . Vitriectomy  08-2015    There were no vitals filed for this visit.      Subjective Assessment - 11/05/15 1243    Subjective Pt worked ON BALANCE AND walking this morning.   Patient Stated Goals Pt's goal for therapy would be balance and walking.   Currently in Pain? No/denies                            PWR [facility]) - 11/05/15 1247    PWR! Up x20   PWR! Rock [employer]! Twist x20   PWR! Step x20   Comments seated Min cues for technique and posture.          Balance Exercises -  11/05/15 1305    Balance Exercises: Standing   Retro Gait 5 reps  progressing from 2 to 1 UE support   Sidestepping 5 reps  no UE support   Marching Limitations high knees progressing from 2 to no UE support.           PT Education - 11/05/15 1425    Education provided Yes   Education Details Reviewed technique for PWR! Moves seated   Person(s) Educated Patient   Methods Explanation;Demonstration;Handout;Verbal cues   Comprehension Verbalized understanding;Returned demonstration          PT Short Term Goals - 04/26/15 1209    PT SHORT TERM GOAL #1   Title Pt will perform HEP independently, to address Parkinson's specific deficits, for improved functional mobility.  TARGET 04/06/15   Time 4   Period Weeks   Status Partially Met   PT SHORT TERM GOAL #2   Title Pt will perform sit<>stand transfers from 18 inches or below, at least 8 of 10 trials without UE support, for improved transfer efficiency, safety and functional strength.   Time 4   Period Weeks   Status Achieved   PT SHORT TERM  GOAL #3   Title Pt will improve TUG score to less than or equal to 13.5 seconds for improved turns with gait and decreased fall risk.   Baseline 14.07 sec (improved from 14.77 sec)-04/06/15   Time 4   Period Weeks   Status Not Met   PT SHORT TERM GOAL #4   Title Pt will improve Functional Gait Assessment to at least 16/30 for decreased fall risk.   Time 4   Period Weeks   Status Not Met   PT SHORT TERM GOAL #5   Title Pt will verbalize understanding of local Parkinson's disease resources.   Baseline Discussed Power over Parkinson's community group 04/26/15   Time 4   Period Weeks   Status Achieved           PT Long Term Goals - 10/25/15 1756    PT LONG TERM GOAL #1   Title Pt will be independent with progressive HEP to address balance, strength, posture and gait.  TARGET 11/24/15   Time 4   Period Weeks   Status New   PT LONG TERM GOAL #2   Title Pt will improve TUG score to  less than or equal to 13.5 seconds for decreased fall risk.   Time 4   Period Weeks   Status New   PT LONG TERM GOAL #3   Title Pt will improve Functional Gait Assessment score to at least 16/30 for decreased fall risk.   Time 4   Period Weeks   Status New   PT LONG TERM GOAL #4   Title Pt will verbalize plans for continued community fitness upon D/C from PT.   Time 4   Period Weeks   Status New   PT LONG TERM GOAL #5   Title Pt will verbalize understanding of fall prevention in the home environment.   Time 4   Period Weeks   Status New               Plan - 11/05/15 1426    Clinical Impression Statement Pt is progressing with confidencen in performing HEP and going to community type exercise classes and in progressing to the helpful habit of writting what exercise programs she has done that day.   Rehab Potential Good   PT Frequency 2x / week   PT Duration 4 weeks  plus eval   PT Treatment/Interventions ADLs/Self Care Home Management;Therapeutic exercise;Therapeutic activities;Functional mobility training;Gait training;Balance training;Neuromuscular re-education;Patient/family education   PT Next Visit Plan compliant surface training (corner balance HEP)   Consulted and Agree with Plan of Care Patient      Patient will benefit from skilled therapeutic intervention in order to improve the following deficits and impairments:  Abnormal gait, Decreased activity tolerance, Difficulty walking, Decreased mobility, Decreased strength, Decreased coordination, Postural dysfunction  Visit Diagnosis: Other abnormalities of gait and mobility  Unsteadiness on feet  Abnormal posture     Problem List Patient Active Problem List   Diagnosis Date Noted  . Acute bacterial bronchitis 07/01/2015  . Arterial hypotension 06/18/2015  . Influenza 06/13/2015  . PCP NOTES >>>>>>>>>>>>>>>>>>>>>>> 03/02/2015  . Atrial fibrillation with RVR ([place]) 01/31/2015  . Elevated troponin  01/31/2015  . COPD (chronic obstructive pulmonary disease) ([place]) 01/31/2015  . Lower abdominal pain 12/05/2014  . Other fatigue 06/21/2014  . Lightheadedness 06/12/2014  . Fall from other slipping, tripping, or stumbling 11/17/2013  . Concussion without loss of consciousness 11/17/2013  . COPD exacerbation ([place]) 09/26/2013  . Sinusitis, acute maxillary 07/19/2012  .  Musculoskeletal pain 05/21/2012  . IBS (irritable bowel syndrome) 04/02/2012  . Ear bleeding 12/10/2011  . Physical exam, annual 11/25/2011  . Generalized anxiety disorder 07/10/2011  . Parkinson's disease ([place]) 02/03/2011  . Shoulder pain 02/03/2011  . Recurrent UTI 11/01/2010  . GERD (gastroesophageal reflux disease) 11/01/2010  . Memory loss 11/01/2010  . Hip pain, right 09/23/2010  . COMPRESSION FRACTURE, LUMBAR VERTEBRAE 03/28/2010  . Abdominal pain 03/27/2010  . DIZZINESS 03/22/2010  . Mycotic toenails 10/02/2009  . DIARRHEA-PRESUMED INFECTIOUS 04/02/2009  . DYSPNEA ON EXERTION 01/19/2009  . HEMORRHOIDS-INTERNAL 02/14/2008  . CONSTIPATION 02/14/2008  . PERSONAL HX COLONIC POLYPS 02/14/2008   Bjorn Loser, PTA  11/05/2015, 2:30 PM  [facility] [address] [place], [place], [address] Phone: [phone]   Fax:  [phone]  Name: [name] MRN: [id number] Date of Birth: 05/29/1927

## 2015-11-07 ENCOUNTER — Ambulatory Visit: Payer: Medicare Other | Admitting: Podiatry

## 2015-11-07 ENCOUNTER — Ambulatory Visit: Payer: Medicare Other | Admitting: Physical Therapy

## 2015-11-07 DIAGNOSIS — R293 Abnormal posture: Secondary | ICD-10-CM

## 2015-11-07 DIAGNOSIS — R2681 Unsteadiness on feet: Secondary | ICD-10-CM | POA: Diagnosis not present

## 2015-11-07 DIAGNOSIS — R2689 Other abnormalities of gait and mobility: Secondary | ICD-10-CM | POA: Diagnosis not present

## 2015-11-07 NOTE — Therapy (Signed)
[facility] [address] [place], [place], [address] Phone: [phone]   Fax:  [phone]  Physical Therapy Treatment  Patient Details  Name: [name] MRN: [id number] Date of Birth: [birth year] Referring Provider: Alonza Bogus, DO  Encounter Date: 11/07/2015      PT End of Session - 11/07/15 1426    Visit Number 5  New episode of care 10/25/15   Number of Visits 9   Date for PT Re-Evaluation 12/25/15   Authorization Type Medicare-Gcode every 10th visit; BCBS secondary   PT Start Time 1105   PT Stop Time 1145   PT Time Calculation (min) 40 min   Equipment Utilized During Treatment Gait belt   Activity Tolerance Patient tolerated treatment well   Behavior During Therapy [facility] for tasks assessed/performed      Past Medical History  Diagnosis Date  . Arthritis   . Hyperlipidemia   . 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 Chronic constipation   . Hemorrhoid   . Varicose veins   . Anxiety   . Depression   . GERD (gastroesophageal reflux disease)   . Atrial fibrillation ([place])   . Recurrent UTI   . Parkinson disease ([place])   . Cardiac arrhythmia due to congenital heart disease   . Mycotic toenails 10/27/2012  . COPD (chronic obstructive pulmonary disease) ([place])   . Parkinsonism [facility])     Past Surgical History  Procedure Laterality Date  . Appendectomy  82 years old  . Colon resection  2008  . Vitriectomy  08-2015    There were no vitals filed for this visit.      Subjective Assessment - 11/07/15 1105    Subjective Pt worked walking yesterday 61mn 2x  and went to the pool.   Patient Stated Goals Pt's goal for therapy would be balance and walking.   Pain Score 3    Pain Location Abdomen   Pain Orientation Left   Pain Descriptors / Indicators Burning   Pain Type Chronic pain                              Balance Exercises - 11/07/15 1423    Balance Exercises: Standing    Other Standing Exercises Worked on ankle/hip strategies (progressing onto compliant surface) and postural awareness on compliant surface. Intermittent UE support required and cues for posture and balance reactions.  See HEP handout below for details.           PT Education - 11/07/15 1123    Education provided Yes   Education Details ankle/hip strategies and standing balance on compliant surface   Person(s) Educated Patient   Methods Explanation;Demonstration;Tactile cues;Verbal cues;Handout   Comprehension Verbalized understanding;Returned demonstration;Need further instruction;Verbal cues required;Tactile cues required          PT Short Term Goals - 11/07/15 1426    PT SHORT TERM GOAL #1   Title Pt will perform HEP independently, to address Parkinson's specific deficits, for improved functional mobility.  TARGET 04/06/15   Time 4   Period Weeks   Status Partially Met   PT SHORT TERM GOAL #2   Title Pt will perform sit<>stand transfers from 18 inches or below, at least 8 of 10 trials without UE support, for improved transfer efficiency, safety and functional strength.   Time 4   Period Weeks   Status Achieved   PT SHORT TERM GOAL #3   Title Pt  will improve TUG score to less than or equal to 13.5 seconds for improved turns with gait and decreased fall risk.   Baseline 14.07 sec (improved from 14.77 sec)-04/06/15   Time 4   Period Weeks   Status Not Met   PT SHORT TERM GOAL #4   Title Pt will improve Functional Gait Assessment to at least 16/30 for decreased fall risk.   Time 4   Period Weeks   Status Not Met   PT SHORT TERM GOAL #5   Title Pt will verbalize understanding of local Parkinson's disease resources.   Baseline Discussed Power over Parkinson's community group 04/26/15   Time 4   Period Weeks   Status Achieved           PT Long Term Goals - 10/25/15 1756    PT LONG TERM GOAL #1   Title Pt will be independent with progressive HEP to address balance,  strength, posture and gait.  TARGET 11/24/15   Time 4   Period Weeks   Status New   PT LONG TERM GOAL #2   Title Pt will improve TUG score to less than or equal to 13.5 seconds for decreased fall risk.   Time 4   Period Weeks   Status New   PT LONG TERM GOAL #3   Title Pt will improve Functional Gait Assessment score to at least 16/30 for decreased fall risk.   Time 4   Period Weeks   Status New   PT LONG TERM GOAL #4   Title Pt will verbalize plans for continued community fitness upon D/C from PT.   Time 4   Period Weeks   Status New   PT LONG TERM GOAL #5   Title Pt will verbalize understanding of fall prevention in the home environment.   Time 4   Period Weeks   Status New               Plan - 11/07/15 1427    Clinical Impression Statement Progressed HEP with ankle/hip stategies and standing balance on compliant surface.  Pt required cues for postural awareness and correction while standing on compliant surface ( tending to lean right).   Rehab Potential Good   PT Frequency 2x / week   PT Duration 4 weeks  plus eval   PT Treatment/Interventions ADLs/Self Care Home Management;Therapeutic exercise;Therapeutic activities;Functional mobility training;Gait training;Balance training;Neuromuscular re-education;Patient/family education   PT Next Visit Plan compliant surface training (corner balance HEP)   Consulted and Agree with Plan of Care Patient      Patient will benefit from skilled therapeutic intervention in order to improve the following deficits and impairments:  Abnormal gait, Decreased activity tolerance, Difficulty walking, Decreased mobility, Decreased strength, Decreased coordination, Postural dysfunction  Visit Diagnosis: Other abnormalities of gait and mobility  Unsteadiness on feet  Abnormal posture     Problem List Patient Active Problem List   Diagnosis Date Noted  . Acute bacterial bronchitis 07/01/2015  . Arterial hypotension 06/18/2015  .  Influenza 06/13/2015  . PCP NOTES >>>>>>>>>>>>>>>>>>>>>>> 03/02/2015  . Atrial fibrillation with RVR ([place]) 01/31/2015  . Elevated troponin 01/31/2015  . COPD (chronic obstructive pulmonary disease) ([place]) 01/31/2015  . Lower abdominal pain 12/05/2014  . Other fatigue 06/21/2014  . Lightheadedness 06/12/2014  . Fall from other slipping, tripping, or stumbling 11/17/2013  . Concussion without loss of consciousness 11/17/2013  . COPD exacerbation ([place]) 09/26/2013  . Sinusitis, acute maxillary 07/19/2012  . Musculoskeletal pain 05/21/2012  . IBS (  irritable bowel syndrome) 04/02/2012  . Ear bleeding 12/10/2011  . Physical exam, annual 11/25/2011  . Generalized anxiety disorder 07/10/2011  . Parkinson's disease ([place]) 02/03/2011  . Shoulder pain 02/03/2011  . Recurrent UTI 11/01/2010  . GERD (gastroesophageal reflux disease) 11/01/2010  . Memory loss 11/01/2010  . Hip pain, right 09/23/2010  . COMPRESSION FRACTURE, LUMBAR VERTEBRAE 03/28/2010  . Abdominal pain 03/27/2010  . DIZZINESS 03/22/2010  . Mycotic toenails 10/02/2009  . DIARRHEA-PRESUMED INFECTIOUS 04/02/2009  . DYSPNEA ON EXERTION 01/19/2009  . HEMORRHOIDS-INTERNAL 02/14/2008  . CONSTIPATION 02/14/2008  . PERSONAL HX COLONIC POLYPS 02/14/2008    Bjorn Loser 11/07/2015, 2:36 PM  [facility] [address] [place], [place], [address] Phone: [phone]   Fax:  [phone]  Name: [name] MRN: [id number] Date of Birth: 01-01-1928

## 2015-11-07 NOTE — Patient Instructions (Addendum)
Weight Shift: Anterior / Posterior (Righting / Equilibrium)    Slowly shift weight forward, arms back and hips forward over toes, until heels rise off floor. Return to starting position. Shift weight backward, arms forward and hips back over heels, until toes rise off floor. Hold each position _3___ seconds. Repeat __15__ times per session. Do __1__ sessions per day.     Copyright  VHI. All rights reserved.  Feet Together (Compliant Surface) Head Motion - Eyes Open    With eyes open, standing on compliant surface: __pillow______, feet together, move head slowly: up and down, side to side, diagonal Repeat _10___ times per session. Do __1-2__ sessions per day.  Try above with stagger stance.  Copyright  VHI. All rights reserved.

## 2015-11-08 ENCOUNTER — Ambulatory Visit: Payer: Medicare Other | Admitting: Podiatry

## 2015-11-09 ENCOUNTER — Other Ambulatory Visit: Payer: Self-pay | Admitting: Physician Assistant

## 2015-11-13 ENCOUNTER — Ambulatory Visit: Payer: Medicare Other | Admitting: Physical Therapy

## 2015-11-13 ENCOUNTER — Ambulatory Visit (INDEPENDENT_AMBULATORY_CARE_PROVIDER_SITE_OTHER): Payer: Medicare Other | Admitting: Psychiatry

## 2015-11-13 DIAGNOSIS — R2681 Unsteadiness on feet: Secondary | ICD-10-CM | POA: Diagnosis not present

## 2015-11-13 DIAGNOSIS — F411 Generalized anxiety disorder: Secondary | ICD-10-CM

## 2015-11-13 DIAGNOSIS — R2689 Other abnormalities of gait and mobility: Secondary | ICD-10-CM | POA: Diagnosis not present

## 2015-11-13 DIAGNOSIS — R293 Abnormal posture: Secondary | ICD-10-CM | POA: Diagnosis not present

## 2015-11-13 NOTE — Patient Instructions (Signed)
Optimal Fitness Program after Therapy for People with Parkinson's Disease  1)  Therapy Home Exercise Program  -Do these Exercises DAILY as instructed by your therapist  -Big, deliberate effort with exercises  -These exercises are important to perform consistently, even when therapist has  finished, because these therapy exercises often address your specific Parkinson's difficulties  2)  Walking  -  Work up to walking 3-5 times per week, 20-30 minutes per day  -This can be done at home, driveway, quiet street or an indoor track  -Focus should be on your Best posture, arm swing, step length for your best walking pattern  3)  Aerobic Exercise  -Work up to 3-5 times per week, 30 minutes per day  -This can be stationary bike, seated stepper machine, elliptical machine  -Work up to 7-8/10 intensity during the exercise, at minimal to moderate     Resistance     (Exercise) Monday Tuesday Wednesday Thursday Friday Saturday Sunday

## 2015-11-13 NOTE — Therapy (Signed)
[facility] [address] [place], [place], [address] Phone: [phone]   Fax:  [phone]  Physical Therapy Treatment  Patient Details  Name: [name] MRN: [id number] Date of Birth: 1927/07/08 Referring Provider: Alonza Bogus, DO  Encounter Date: 11/13/2015      PT End of Session - 11/13/15 1320    Visit Number 6  New episode of care 10/25/15   Number of Visits 9   Date for PT Re-Evaluation 12/25/15   Authorization Type Medicare-Gcode every 10th visit; BCBS secondary   PT Start Time 0848   PT Stop Time 0932   PT Time Calculation (min) 44 min   Equipment Utilized During Treatment Gait belt   Activity Tolerance Patient tolerated treatment well   Behavior During Therapy [facility] for tasks assessed/performed      Past Medical History:  Diagnosis Date  . Anxiety   . Arthritis   . Atrial fibrillation ([place])   . Cardiac arrhythmia due to congenital heart disease   . Chronic constipation   . COPD (chronic obstructive pulmonary disease) ([place])   . Depression   . GERD (gastroesophageal reflux disease)   . Hemorrhoid   . Hyperlipidemia   . Mycotic toenails 10/27/2012  . Parkinson disease ([place])   . Parkinsonism ([place])   . Recurrent UTI   . 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 Varicose veins     Past Surgical History:  Procedure Laterality Date  . APPENDECTOMY  82 years old  . COLON RESECTION  2008  . vitriectomy  08-2015    There were no vitals filed for this visit.      Subjective Assessment - 11/13/15 0851    Subjective "I'm taking a balance class."   Patient Stated Goals Pt's goal for therapy would be balance and walking.   Currently in Pain? No/denies      In corner for hip/ankle strategy exercises with chair in front of pt for support. Performed on corner balance exercises on compliant and noncompliant surface with eyes open for head turns and nods with feet together and staggered  stance. Sit<>stand x 10 from mat without UE assist and x 10 on foam mat.  Standing wall posture x 1 minute x 2 with pillow for target behind head Seated hamstring stretch x 60 sec x bil LE's Scifit level 1.5 all 4 extremities x 10 minutes with rpm >80.  SaO2 98% after Scifit.  Discussed optimal community fitness upon d/c and adding back cardio to routine.  Pt was using recumbent bike at [facility] but has not been doing that recently.   Discussed Fall Prevention.  Pt has fall prevention information from prior sessions and is currently taking A Matter of Balance class.          PT Education - 11/13/15 1318    Education provided Yes   Education Details [employer] upon d/c, Fall Prevention   Person(s) Educated Patient   Methods Explanation;Demonstration;Handout   Comprehension Verbalized understanding          PT Short Term Goals - 11/07/15 1426      PT SHORT TERM GOAL #1   Title Pt will perform HEP independently, to address Parkinson's specific deficits, for improved functional mobility.  TARGET 04/06/15   Time 4   Period Weeks   Status Partially Met     PT SHORT TERM GOAL #2   Title Pt will perform sit<>stand transfers from 18 inches or below, at least 8 of 10  trials without UE support, for improved transfer efficiency, safety and functional strength.   Time 4   Period Weeks   Status Achieved     PT SHORT TERM GOAL #3   Title Pt will improve TUG score to less than or equal to 13.5 seconds for improved turns with gait and decreased fall risk.   Baseline 14.07 sec (improved from 14.77 sec)-04/06/15   Time 4   Period Weeks   Status Not Met     PT SHORT TERM GOAL #4   Title Pt will improve Functional Gait Assessment to at least 16/30 for decreased fall risk.   Time 4   Period Weeks   Status Not Met     PT SHORT TERM GOAL #5   Title Pt will verbalize understanding of local Parkinson's disease resources.   Baseline Discussed Power over Parkinson's community  group 04/26/15   Time 4   Period Weeks   Status Achieved           PT Long Term Goals - 11/13/15 1319      PT LONG TERM GOAL #1   Title Pt will be independent with progressive HEP to address balance, strength, posture and gait.  TARGET 11/24/15   Time 4   Period Weeks   Status New     PT LONG TERM GOAL #2   Title Pt will improve TUG score to less than or equal to 13.5 seconds for decreased fall risk.   Time 4   Period Weeks   Status New     PT LONG TERM GOAL #3   Title Pt will improve Functional Gait Assessment score to at least 16/30 for decreased fall risk.   Time 4   Period Weeks   Status New     PT LONG TERM GOAL #4   Title Pt will verbalize plans for continued community fitness upon D/C from PT.   Time 4   Period Weeks   Status Achieved     PT LONG TERM GOAL #5   Title Pt will verbalize understanding of fall prevention in the home environment.   Time 4   Period Weeks   Status Achieved               Plan - 11/13/15 1320    Clinical Impression Statement Pt met LTG 4 & 5.  Finish checking goals next session per POC.   Rehab Potential Good   PT Frequency 2x / week   PT Duration 4 weeks  plus eval   PT Treatment/Interventions ADLs/Self Care Home Management;Therapeutic exercise;Therapeutic activities;Functional mobility training;Gait training;Balance training;Neuromuscular re-education;Patient/family education   PT Next Visit Plan Finish checking goals.   Consulted and Agree with Plan of Care Patient      Patient will benefit from skilled therapeutic intervention in order to improve the following deficits and impairments:  Abnormal gait, Decreased activity tolerance, Difficulty walking, Decreased mobility, Decreased strength, Decreased coordination, Postural dysfunction  Visit Diagnosis: Other abnormalities of gait and mobility  Unsteadiness on feet  Abnormal posture     Problem List Patient Active Problem List   Diagnosis Date Noted  . Acute  bacterial bronchitis 07/01/2015  . Arterial hypotension 06/18/2015  . Influenza 06/13/2015  . PCP NOTES >>>>>>>>>>>>>>>>>>>>>>> 03/02/2015  . Atrial fibrillation with RVR (HCC) 01/31/2015  . Elevated troponin 01/31/2015  . COPD (chronic obstructive pulmonary disease) (HCC) 01/31/2015  . Lower abdominal pain 12/05/2014  . Other fatigue 06/21/2014  . Lightheadedness 06/12/2014  . Fall from other slipping,  tripping, or stumbling 11/17/2013  . Concussion without loss of consciousness 11/17/2013  . COPD exacerbation ([place]) 09/26/2013  . Sinusitis, acute maxillary 07/19/2012  . Musculoskeletal pain 05/21/2012  . IBS (irritable bowel syndrome) 04/02/2012  . Ear bleeding 12/10/2011  . Physical exam, annual 11/25/2011  . Generalized anxiety disorder 07/10/2011  . Parkinson's disease ([place]) 02/03/2011  . Shoulder pain 02/03/2011  . Recurrent UTI 11/01/2010  . GERD (gastroesophageal reflux disease) 11/01/2010  . Memory loss 11/01/2010  . Hip pain, right 09/23/2010  . COMPRESSION FRACTURE, LUMBAR VERTEBRAE 03/28/2010  . Abdominal pain 03/27/2010  . DIZZINESS 03/22/2010  . Mycotic toenails 10/02/2009  . DIARRHEA-PRESUMED INFECTIOUS 04/02/2009  . DYSPNEA ON EXERTION 01/19/2009  . HEMORRHOIDS-INTERNAL 02/14/2008  . CONSTIPATION 02/14/2008  . PERSONAL HX COLONIC POLYPS 02/14/2008    Narda Bonds 11/13/2015, 1:24 PM  [facility] [address] [place], [place], [address] Phone: [phone]   Fax:  [phone]  Name: TARIKA MCKETHAN MRN: [id number] Date of Birth: Sep 20, 1927   Narda Bonds, PTA [facility] 11/13/15 1:24 PM Phone: [phone] Fax: [phone]

## 2015-11-15 ENCOUNTER — Ambulatory Visit: Payer: Medicare Other | Admitting: Physical Therapy

## 2015-11-15 DIAGNOSIS — R293 Abnormal posture: Secondary | ICD-10-CM

## 2015-11-15 DIAGNOSIS — R2689 Other abnormalities of gait and mobility: Secondary | ICD-10-CM | POA: Diagnosis not present

## 2015-11-15 DIAGNOSIS — R2681 Unsteadiness on feet: Secondary | ICD-10-CM | POA: Diagnosis not present

## 2015-11-15 NOTE — Therapy (Signed)
[facility] [address] [place], [place], [address] Phone: [phone]   Fax:  [phone]  Physical Therapy Treatment  Patient Details  Name: [name] MRN: [id number] Date of Birth: 19-Mar-1928 Referring Provider: Alonza Bogus, DO  Encounter Date: 11/15/2015      PT End of Session - 11/15/15 2131    Visit Number 7  New episode of care 10/25/15   Number of Visits 9   Date for PT Re-Evaluation 12/25/15   Authorization Type Medicare-Gcode every 10th visit; BCBS secondary   PT Start Time 0849   PT Stop Time 0928   PT Time Calculation (min) 39 min   Activity Tolerance Patient tolerated treatment well   Behavior During Therapy [facility] for tasks assessed/performed      Past Medical History:  Diagnosis Date  . Anxiety   . Arthritis   . Atrial fibrillation ([place])   . Cardiac arrhythmia due to congenital heart disease   . Chronic constipation   . COPD (chronic obstructive pulmonary disease) ([place])   . Depression   . GERD (gastroesophageal reflux disease)   . Hemorrhoid   . Hyperlipidemia   . Mycotic toenails 10/27/2012  . Parkinson disease ([place])   . Parkinsonism ([place])   . Recurrent UTI   . 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 Varicose veins     Past Surgical History:  Procedure Laterality Date  . APPENDECTOMY  82 years old  . COLON RESECTION  2008  . vitriectomy  08-2015    There were no vitals filed for this visit.      Subjective Assessment - 11/15/15 0851    Subjective I'm doing the "A Matter of Balance class."  Trying to decide if I'll get back started at [facility].   Patient Stated Goals Pt's goal for therapy would be balance and walking.   Currently in Pain? No/denies            [facility] PT Assessment - 11/15/15 0853      Functional Gait  Assessment   Gait assessed  Yes   Gait Level Surface Walks 20 ft in less than 7 sec but greater than 5.5 sec, uses assistive device, slower speed, mild gait  deviations, or deviates 6-10 in outside of the 12 in walkway width.  6.9   Change in Gait Speed Able to change speed, demonstrates mild gait deviations, deviates 6-10 in outside of the 12 in walkway width, or no gait deviations, unable to achieve a major change in velocity, or uses a change in velocity, or uses an assistive device.   Gait with Horizontal Head Turns Performs head turns smoothly with slight change in gait velocity (eg, minor disruption to smooth gait path), deviates 6-10 in outside 12 in walkway width, or uses an assistive device.   Gait with Vertical Head Turns Performs task with slight change in gait velocity (eg, minor disruption to smooth gait path), deviates 6 - 10 in outside 12 in walkway width or uses assistive device   Gait and Pivot Turn Pivot turns safely within 3 sec and stops quickly with no loss of balance.   Step Over Obstacle Is able to step over one shoe box (4.5 in total height) but must slow down and adjust steps to clear box safely. May require verbal cueing.   Gait with Narrow Base of Support Ambulates less than 4 steps heel to toe or cannot perform without assistance.   Gait with Eyes Closed Cannot walk 20 ft  without assistance, severe gait deviations or imbalance, deviates greater than 15 in outside 12 in walkway width or will not attempt task.   Ambulating Backwards Walks 20 ft, slow speed, abnormal gait pattern, evidence for imbalance, deviates 10-15 in outside 12 in walkway width.  25.76   Steps Alternating feet, must use rail.   Total Score 15   FGA comment: improved from 14/30 at eval.  Scores <15/30 indciate increased fall risk.                     [facility] Adult PT Treatment/Exercise - 11/15/15 0853      Transfers   Transfers Sit to Stand;Stand to Sit   Sit to Stand 7: Independent;Without upper extremity assist;From chair/3-in-1   Five time sit to stand comments  12.56   Stand to Sit 7: Independent;To chair/3-in-1;Without upper extremity  assist     Ambulation/Gait   Ambulation/Gait Yes   Ambulation/Gait Assistance 6: Modified independent (Device/Increase time)   Ambulation/Gait Assistance Details During gait, PT provides cues for upright posture, including use of visual cues for improved posture.   Ambulation Distance (Feet) 230 Feet  x 2   Assistive device Straight cane   Gait Pattern Step-through pattern;Decreased step length - right;Decreased step length - left;Narrow base of support   Ambulation Surface Level;Indoor   Gait velocity 11.97 sec= 2.74 ft/sec     Standardized Balance Assessment   Standardized Balance Assessment Timed Up and Go Test     Timed Up and Go Test   TUG Normal TUG   Normal TUG (seconds) 12.94          Self Care:  Provided information on [employer] Cycling class, and encouraged patient to contact [facility] to reconnect about the possibility of exercise scholarship.  Verbally reviewed HEP, with pt verbalizing understanding of how to prioritize exercises to perform.  Discussed progress towards goals, plans for discharge this visit.  Discussed return screen versus return PT eval in 6 + months due to progressive nature of disease.  Pt opts to schedule PT evaluation in 6+ months.  Discussed continued need for cane for safety with gait, despite improved confidence.      PT Education - 11/15/15 2130    Education provided Yes   Education Details Community fitness options, including return to [facility] and PD cycling class at [facility]) Educated Patient   Methods Explanation;Handout   Comprehension Verbalized understanding             PT Long Term Goals - 11/15/15 1937      PT LONG TERM GOAL #1   Title Pt will be independent with progressive HEP to address balance, strength, posture and gait.  TARGET 11/24/15   Time 4   Period Weeks   Status Achieved     PT LONG TERM GOAL #2   Title Pt will improve TUG score to less than or equal to 13.5 seconds for decreased fall risk.   Time 4   Period Weeks    Status Achieved     PT LONG TERM GOAL #3   Title Pt will improve Functional Gait Assessment score to at least 16/30 for decreased fall risk.   Time 4   Period Weeks   Status Not Met     PT LONG TERM GOAL #4   Title Pt will verbalize plans for continued community fitness upon D/C from PT.   Time 4   Period Weeks   Status Achieved  PT LONG TERM GOAL #5   Title Pt will verbalize understanding of fall prevention in the home environment.   Time 4   Period Weeks   Status Achieved               Plan - 2015-11-30 Jul 12, 2131    Clinical Impression Statement Pt has met LTG 1 and 2, LTG #3 not met.    Pt reports improved balance confidence since start of therapy and is committing to return to community fitness.  Pt is appropriate for discharge at this time.   Rehab Potential Good   PT Frequency 2x / week   PT Duration 4 weeks  plus eval   PT Treatment/Interventions ADLs/Self Care Home Management;Therapeutic exercise;Therapeutic activities;Functional mobility training;Gait training;Balance training;Neuromuscular re-education;Patient/family education   PT Next Visit Plan DC this visit.   Recommended Other Services Due to progressive nature of disease, pt would benefit from physical therapy evaluation in 6 months to assess functional mobility.  Pt in agreement.   Consulted and Agree with Plan of Care Patient      Patient will benefit from skilled therapeutic intervention in order to improve the following deficits and impairments:  Abnormal gait, Decreased activity tolerance, Difficulty walking, Decreased mobility, Decreased strength, Decreased coordination, Postural dysfunction  Visit Diagnosis: Other abnormalities of gait and mobility  Abnormal posture       G-Codes - 11/30/2015 07-12-2135    Functional Assessment Tool Used Functional Gait Assessment 15/30, gait velocity 2.74 ft/sec, 5xsit<>stand 12.74 sec   Functional Limitation Mobility: Walking and moving around   Mobility: Walking  and Moving Around Goal Status [phone]) At least 1 percent but less than 20 percent impaired, limited or restricted   Mobility: Walking and Moving Around Discharge Status [phone]) At least 1 percent but less than 20 percent impaired, limited or restricted      Problem List Patient Active Problem List   Diagnosis Date Noted  . Acute bacterial bronchitis 07/01/2015  . Arterial hypotension 06/18/2015  . Influenza 06/13/2015  . PCP NOTES >>>>>>>>>>>>>>>>>>>>>>> 03/02/2015  . Atrial fibrillation with RVR ([place]) 01/31/2015  . Elevated troponin 01/31/2015  . COPD (chronic obstructive pulmonary disease) ([place]) 01/31/2015  . Lower abdominal pain 12/05/2014  . Other fatigue 06/21/2014  . Lightheadedness 06/12/2014  . Fall from other slipping, tripping, or stumbling 11/17/2013  . Concussion without loss of consciousness 11/17/2013  . COPD exacerbation ([place]) 09/26/2013  . Sinusitis, acute maxillary 07/19/2012  . Musculoskeletal pain 05/21/2012  . IBS (irritable bowel syndrome) 04/02/2012  . Ear bleeding 12/10/2011  . Physical exam, annual 11/25/2011  . Generalized anxiety disorder 07/10/2011  . Parkinson's disease ([place]) 02/03/2011  . Shoulder pain 02/03/2011  . Recurrent UTI 11/01/2010  . GERD (gastroesophageal reflux disease) 11/01/2010  . Memory loss 11/01/2010  . Hip pain, right 09/23/2010  . COMPRESSION FRACTURE, LUMBAR VERTEBRAE 03/28/2010  . Abdominal pain 03/27/2010  . DIZZINESS 03/22/2010  . Mycotic toenails 10/02/2009  . DIARRHEA-PRESUMED INFECTIOUS 04/02/2009  . DYSPNEA ON EXERTION 01/19/2009  . HEMORRHOIDS-INTERNAL 02/14/2008  . CONSTIPATION 02/14/2008  . PERSONAL HX COLONIC POLYPS 02/14/2008    Rickayla Wieland W. 11-30-15, 9:42 PM  Frazier Butt., PT  [facility] [address] [place], [place], [address] Phone: [phone]   Fax:  [phone]  Name: FAWNA CRANMER MRN: [id number] Date of Birth: 1927-12-03    PHYSICAL THERAPY DISCHARGE SUMMARY  Visits from Start of Care: 7  Current functional level related to goals / functional outcomes:  PT Long Term Goals - 11/15/15 9728      PT LONG TERM GOAL #1   Title Pt will be independent with progressive HEP to address balance, strength, posture and gait.  TARGET 11/24/15   Time 4   Period Weeks   Status Achieved     PT LONG TERM GOAL #2   Title Pt will improve TUG score to less than or equal to 13.5 seconds for decreased fall risk.   Time 4   Period Weeks   Status Achieved     PT LONG TERM GOAL #3   Title Pt will improve Functional Gait Assessment score to at least 16/30 for decreased fall risk.   Time 4   Period Weeks   Status Not Met     PT LONG TERM GOAL #4   Title Pt will verbalize plans for continued community fitness upon D/C from PT.   Time 4   Period Weeks   Status Achieved     PT LONG TERM GOAL #5   Title Pt will verbalize understanding of fall prevention in the home environment.   Time 4   Period Weeks   Status Achieved     Pt has met 4 of 5 long term goals and reports improved balance confidence.   Remaining deficits: Posture, balance/gait, endurance   Education / Equipment: Pt educated in updated HEP, fall prevention and community fitness, with pt verbalizing understanding.  Plan: Patient agrees to discharge.  Patient goals were partially met. Patient is being discharged due to being pleased with the current functional level.  ?????        Mady Haagensen, PT 11/15/15 9:48 PM Phone: [phone] Fax: [phone]

## 2015-11-20 DIAGNOSIS — R42 Dizziness and giddiness: Secondary | ICD-10-CM

## 2015-11-20 HISTORY — DX: Dizziness and giddiness: R42

## 2015-11-22 ENCOUNTER — Encounter (HOSPITAL_BASED_OUTPATIENT_CLINIC_OR_DEPARTMENT_OTHER): Payer: Self-pay | Admitting: Emergency Medicine

## 2015-11-22 ENCOUNTER — Observation Stay (HOSPITAL_BASED_OUTPATIENT_CLINIC_OR_DEPARTMENT_OTHER)
Admission: EM | Admit: 2015-11-22 | Discharge: 2015-11-22 | Disposition: A | Discharge status: Home / Self Care | Payer: Medicare Other | Attending: Family Medicine | Admitting: Family Medicine

## 2015-11-22 ENCOUNTER — Emergency Department (HOSPITAL_BASED_OUTPATIENT_CLINIC_OR_DEPARTMENT_OTHER): Payer: Medicare Other

## 2015-11-22 ENCOUNTER — Ambulatory Visit: Payer: Self-pay | Admitting: Family Medicine

## 2015-11-22 DIAGNOSIS — R7989 Other specified abnormal findings of blood chemistry: Secondary | ICD-10-CM | POA: Diagnosis present

## 2015-11-22 DIAGNOSIS — I4891 Unspecified atrial fibrillation: Secondary | ICD-10-CM | POA: Insufficient documentation

## 2015-11-22 DIAGNOSIS — J449 Chronic obstructive pulmonary disease, unspecified: Secondary | ICD-10-CM | POA: Diagnosis not present

## 2015-11-22 DIAGNOSIS — W1839XA Other fall on same level, initial encounter: Secondary | ICD-10-CM | POA: Insufficient documentation

## 2015-11-22 DIAGNOSIS — H538 Other visual disturbances: Secondary | ICD-10-CM | POA: Diagnosis not present

## 2015-11-22 DIAGNOSIS — Z5181 Encounter for therapeutic drug level monitoring: Secondary | ICD-10-CM | POA: Diagnosis not present

## 2015-11-22 DIAGNOSIS — R42 Dizziness and giddiness: Secondary | ICD-10-CM | POA: Diagnosis not present

## 2015-11-22 DIAGNOSIS — R072 Precordial pain: Secondary | ICD-10-CM

## 2015-11-22 DIAGNOSIS — J42 Unspecified chronic bronchitis: Secondary | ICD-10-CM

## 2015-11-22 DIAGNOSIS — R0789 Other chest pain: Secondary | ICD-10-CM

## 2015-11-22 DIAGNOSIS — Y929 Unspecified place or not applicable: Secondary | ICD-10-CM | POA: Diagnosis not present

## 2015-11-22 DIAGNOSIS — Z87891 Personal history of nicotine dependence: Secondary | ICD-10-CM | POA: Insufficient documentation

## 2015-11-22 DIAGNOSIS — Y939 Activity, unspecified: Secondary | ICD-10-CM | POA: Diagnosis not present

## 2015-11-22 DIAGNOSIS — K219 Gastro-esophageal reflux disease without esophagitis: Secondary | ICD-10-CM | POA: Diagnosis present

## 2015-11-22 DIAGNOSIS — S0990XA Unspecified injury of head, initial encounter: Principal | ICD-10-CM | POA: Insufficient documentation

## 2015-11-22 DIAGNOSIS — Y998 Other external cause status: Secondary | ICD-10-CM | POA: Insufficient documentation

## 2015-11-22 DIAGNOSIS — R778 Other specified abnormalities of plasma proteins: Secondary | ICD-10-CM

## 2015-11-22 DIAGNOSIS — Z7901 Long term (current) use of anticoagulants: Secondary | ICD-10-CM | POA: Insufficient documentation

## 2015-11-22 DIAGNOSIS — R079 Chest pain, unspecified: Secondary | ICD-10-CM | POA: Diagnosis present

## 2015-11-22 HISTORY — DX: Dizziness and giddiness: R42

## 2015-11-22 LAB — CBC WITH DIFFERENTIAL/PLATELET
BAND NEUTROPHILS: 4 %
BASOS ABS: 0.1 10*3/uL (ref 0.0–0.1)
BLASTS: 0 %
Basophils Relative: 1 %
EOS ABS: 0.1 10*3/uL (ref 0.0–0.7)
Eosinophils Relative: 1 %
HEMATOCRIT: 37.2 % (ref 36.0–46.0)
HEMOGLOBIN: 12.4 g/dL (ref 12.0–15.0)
LYMPHS PCT: 26 %
Lymphs Abs: 1.6 10*3/uL (ref 0.7–4.0)
MCH: 30.8 pg (ref 26.0–34.0)
MCHC: 33.3 g/dL (ref 30.0–36.0)
MCV: 92.3 fL (ref 78.0–100.0)
MYELOCYTES: 0 %
Metamyelocytes Relative: 0 %
Monocytes Absolute: 0.4 10*3/uL (ref 0.1–1.0)
Monocytes Relative: 7 %
NEUTROS ABS: 3.9 10*3/uL (ref 1.7–7.7)
NEUTROS PCT: 61 %
NRBC: 1 /100{WBCs} — AB
PROMYELOCYTES ABS: 0 %
Platelets: 186 10*3/uL (ref 150–400)
RBC: 4.03 MIL/uL (ref 3.87–5.11)
RDW: 15.3 % (ref 11.5–15.5)
WBC: 6.1 10*3/uL (ref 4.0–10.5)

## 2015-11-22 LAB — COMPREHENSIVE METABOLIC PANEL
ALK PHOS: 78 U/L (ref 38–126)
ALT: 15 U/L (ref 14–54)
ANION GAP: 7 (ref 5–15)
AST: 21 U/L (ref 15–41)
Albumin: 4 g/dL (ref 3.5–5.0)
BILIRUBIN TOTAL: 0.6 mg/dL (ref 0.3–1.2)
BUN: 13 mg/dL (ref 6–20)
CALCIUM: 9.1 mg/dL (ref 8.9–10.3)
CO2: 29 mmol/L (ref 22–32)
CREATININE: 0.88 mg/dL (ref 0.44–1.00)
Chloride: 97 mmol/L — ABNORMAL LOW (ref 101–111)
GFR calc non Af Amer: 58 mL/min — ABNORMAL LOW (ref >60)
Glucose, Bld: 102 mg/dL — ABNORMAL HIGH (ref 65–99)
Potassium: 4.5 mmol/L (ref 3.5–5.1)
Sodium: 133 mmol/L — ABNORMAL LOW (ref 135–145)
TOTAL PROTEIN: 6.5 g/dL (ref 6.5–8.1)

## 2015-11-22 LAB — URINALYSIS, ROUTINE W REFLEX MICROSCOPIC
BILIRUBIN URINE: NEGATIVE
Glucose, UA: NEGATIVE mg/dL
Hgb urine dipstick: NEGATIVE
Ketones, ur: NEGATIVE mg/dL
NITRITE: NEGATIVE
PH: 6 (ref 5.0–8.0)
Protein, ur: NEGATIVE mg/dL
SPECIFIC GRAVITY, URINE: 1.012 (ref 1.005–1.030)

## 2015-11-22 LAB — URINE MICROSCOPIC-ADD ON

## 2015-11-22 LAB — TROPONIN I: Troponin I: 0.04 ng/mL (ref <0.03)

## 2015-11-22 LAB — PROTIME-INR
INR: 1.31
PROTHROMBIN TIME: 16.4 s — AB (ref 11.4–15.2)

## 2015-11-22 MED ORDER — ONDANSETRON HCL 4 MG/2ML IJ SOLN: 4.0000 mg | Freq: Four times a day (QID) | INTRAMUSCULAR | Status: DC | PRN

## 2015-11-22 MED ORDER — ENOXAPARIN SODIUM 40 MG/0.4ML ~~LOC~~ SOLN: 40.0000 mg | SUBCUTANEOUS | Status: DC

## 2015-11-22 MED ORDER — MECLIZINE HCL 25 MG PO TABS: 50.0000 mg | ORAL_TABLET | Freq: Three times a day (TID) | ORAL | Status: DC | PRN

## 2015-11-22 MED ORDER — ONDANSETRON 4 MG PO TBDP
4.0000 mg | ORAL_TABLET | Freq: Four times a day (QID) | ORAL | Status: DC | PRN
  Filled 2015-11-22: qty 1

## 2015-11-22 MED ORDER — ALBUTEROL SULFATE (2.5 MG/3ML) 0.083% IN NEBU: 2.5000 mg | INHALATION_SOLUTION | Freq: Two times a day (BID) | RESPIRATORY_TRACT | Status: DC | PRN

## 2015-11-22 MED ORDER — SERTRALINE HCL 50 MG PO TABS: 150.0000 mg | ORAL_TABLET | Freq: Every day | ORAL | Status: DC

## 2015-11-22 MED ORDER — AZELASTINE HCL 0.1 % NA SOLN: 2.0000 | Freq: Every evening | NASAL | Status: DC | PRN

## 2015-11-22 MED ORDER — ACETAMINOPHEN 325 MG PO TABS: 650.0000 mg | ORAL_TABLET | Freq: Four times a day (QID) | ORAL | Status: DC | PRN

## 2015-11-22 MED ORDER — MOMETASONE FURO-FORMOTEROL FUM 200-5 MCG/ACT IN AERO
2.0000 | INHALATION_SPRAY | Freq: Two times a day (BID) | RESPIRATORY_TRACT | Status: DC
  Filled 2015-11-22: qty 8.8

## 2015-11-22 MED ORDER — SODIUM CHLORIDE 0.9 % IV SOLN: INTRAVENOUS | Status: DC

## 2015-11-22 MED ORDER — APIXABAN 5 MG PO TABS: 5.0000 mg | ORAL_TABLET | Freq: Two times a day (BID) | ORAL | Status: DC

## 2015-11-22 MED ORDER — DILTIAZEM HCL 30 MG PO TABS: 30.0000 mg | ORAL_TABLET | Freq: Four times a day (QID) | ORAL | Status: DC

## 2015-11-22 NOTE — Plan of Care (Signed)
Problem: Skin Integrity: Goal: Risk for impaired skin integrity will decrease Outcome: Not Met (add Reason) Patient left AMA

## 2015-11-22 NOTE — Plan of Care (Signed)
Problem: Pain Managment: Goal: General experience of comfort will improve Outcome: Not Met (add Reason) Patient left AMA

## 2015-11-22 NOTE — Plan of Care (Signed)
Problem: Bowel/Gastric: Goal: Will not experience complications related to bowel motility Outcome: Not Met (add Reason) Patient left AMA

## 2015-11-22 NOTE — Plan of Care (Signed)
Problem: Physical Regulation: Goal: Will remain free from infection Outcome: Not Met (add Reason) Patient left AMA

## 2015-11-22 NOTE — Plan of Care (Signed)
Problem: Education: Goal: Knowledge of [facility] Education information/materials will improve Outcome: Not Met (add Reason) Patient left AMA

## 2015-11-22 NOTE — ED Notes (Signed)
Pt ambulated to bathroom with RN supervision, no assistance needed aside from pt use of her cane, ambulating with no difficulty.  Pt denies any dizziness upon standing.

## 2015-11-22 NOTE — Plan of Care (Signed)
Problem: Health Behavior/Discharge Planning: Goal: Ability to manage health-related needs will improve Outcome: Not Met (add Reason) Patient left AMA   

## 2015-11-22 NOTE — Plan of Care (Signed)
Problem: Nutrition: Goal: Adequate nutrition will be maintained Outcome: Not Met (add Reason) Patient left AMA

## 2015-11-22 NOTE — Plan of Care (Signed)
Problem: Activity: Goal: Risk for activity intolerance will decrease Outcome: Not Met (add Reason) Patient left AMA

## 2015-11-22 NOTE — ED Notes (Signed)
Patient transported to CT 

## 2015-11-22 NOTE — Progress Notes (Signed)
Patient taken down to the ED in a wheelchair with her belongings in stable condition.

## 2015-11-22 NOTE — H&P (Signed)
[facility] History and Physical  [name] UXL:[id number] DOB: 02-24-1928 DOA: 11/22/2015  Referring physician: *   PCP: Kathlene November, MD   Chief Complaint: Dizziness  HPI:    82 year old female with history of anxiety, A. fib on elaquist, COPD, Parkinson's, HLD, presenting today with dizziness and fall Pt hit her head on door jamb last night.. Patient reports that at 4 PM yesterday she did start to experience dizziness. She says that she tried to eat a hamburger at [employer] as well as drink plenty of fluids and salt which usually makes it better. Nothing however made it better. Patient got up in the middle the night and felt incredibly dizzy and pitched forward and fell down and hit her head on the left-hand side. She reports that since then she has had blurry vision to the left eye (on exam its on the right side). Patient recently had surgery for macular puckering on the left eye.  Pt continues to have headache, blurry vision.  Pt concerned about head bleed  as she is on Eliquis   She said that she took 2 meclizine today for the dizziness ( indicating that she has a history of dizziness)  . Patient was ambulated in the ER without difficulty , The blurriness of vision is actually in her right eye which is not the eye that her surgery was done on.   Patient transferred to [facility] for evaluation of possible CVA, need for an MRI, abnormal troponin,     Review of Systems: negative for the following  Constitutional: Denies fever, chills, diaphoresis, appetite change and fatigue.  HEENT: Denies photophobia, eye pain, redness, hearing loss, ear pain, congestion, sore throat, rhinorrhea, sneezing, mouth sores, trouble swallowing, neck pain, neck stiffness and tinnitus.  Respiratory: Denies SOB, DOE, cough, chest tightness, and wheezing.  Cardiovascular: Denies chest pain, palpitations and leg swelling.  Gastrointestinal: Denies nausea, vomiting, abdominal pain, diarrhea,  constipation, blood in stool and abdominal distention.  Genitourinary: Denies dysuria, urgency, frequency, hematuria, flank pain and difficulty urinating.  Musculoskeletal: Positive for gait problem.  Neurological: Positive for dizziness and headaches.  Hematological: Denies adenopathy. Easy bruising, personal or family bleeding history  Psychiatric/Behavioral: Denies suicidal ideation, mood changes, confusion, nervousness, sleep disturbance and agitation       Past Medical History:  Diagnosis Date  . Anxiety   . Arthritis   . Atrial fibrillation ([place])   . Cardiac arrhythmia due to congenital heart disease   . Chronic constipation   . COPD (chronic obstructive pulmonary disease) ([place])   . Depression   . GERD (gastroesophageal reflux disease)   . Hemorrhoid   . Hyperlipidemia   . Lightheadedness 11/2015  . Mycotic toenails 10/27/2012  . Parkinson disease ([place])   . Parkinsonism ([place])   . Recurrent UTI   . 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 Varicose veins      Past Surgical History:  Procedure Laterality Date  . APPENDECTOMY  82 years old  . COLON RESECTION  2008  . vitriectomy  08-2015      Social History:  reports that she quit smoking about 35 years ago. Her smoking use included Cigarettes. She has a 60.00 pack-year smoking history. She has never used smokeless tobacco. She reports that she does not drink alcohol or use drugs.    Allergies  Allergen Reactions  . Adhesive [Tape] Other (See Comments)    Caused blisters  . Amoxicillin Other (See Comments)    Caused thrush  .  Ciprofloxacin Other (See Comments)    Caused thrush  . Ultram [Tramadol] Other (See Comments)    hypotension    Family History  Problem Relation Age of Onset  . Asthma Brother   . Alcohol abuse Brother   . Throat cancer Father   . Alcohol abuse Father   . Lupus Daughter   . Bipolar disorder Daughter   . Lupus Daughter   . Anxiety disorder Daughter   . Alcohol abuse Sister   .  Alcohol abuse Maternal Grandfather   . Alcohol abuse Paternal Grandmother   . Breast cancer Sister   . Colon cancer Neg Hx         Prior to Admission medications   Medication Sig Start Date End Date Taking? Authorizing Provider  albuterol (VENTOLIN HFA) 108 (90 BASE) MCG/ACT inhaler Inhale 2 puffs into the lungs 2 (two) times daily as needed for wheezing or shortness of breath. 02/05/15  Yes Midge Minium, MD  apixaban (ELIQUIS) 5 MG TABS tablet Take 5 mg by mouth 2 (two) times daily.   Yes Historical Provider, MD  Ascorbic Acid (VITAMIN C) 1000 MG tablet Take 1,000 mg by mouth daily.   Yes Historical Provider, MD  flecainide (TAMBOCOR) 100 MG tablet Take 50 mg by mouth daily as needed. Reported on 05/26/2015   Yes Historical Provider, MD  meclizine (ANTIVERT) 25 MG tablet Take 50 mg by mouth 3 (three) times daily as needed for dizziness.   Yes Historical Provider, MD  sertraline (ZOLOFT) 100 MG tablet Take 1.5 tablets (150 mg total) by mouth daily. 08/27/15  Yes Colon Branch, MD  SYMBICORT 160-4.5 MCG/ACT inhaler INHALE 2 PUFFS BY MOUTH INTO THE LUNGS TWICE DAILY. 11/09/15  Yes Brunetta Jeans, PA-C  azelastine (ASTELIN) 0.1 % nasal spray Place 2 sprays into both nostrils at bedtime as needed for rhinitis. Use in each nostril as directed 04/11/15   Colon Branch, MD  Calcium Carb-Cholecalciferol (CALCIUM 1000 + D PO) Take 1,000 mg by mouth daily.    Historical Provider, MD  diltiazem (CARDIZEM) 30 MG tablet Take 1 tablet (30 mg total) by mouth 4 (four) times daily. Take at onset on atrial fibrillation Patient taking differently: Take 30 mg by mouth as needed. Take at onset on atrial fibrillation 02/02/15   Orson Eva, MD  trimethoprim (TRIMPEX) 100 MG tablet Take 100 mg by mouth daily. Reported on 10/03/2015    Historical Provider, MD  vitamin B-12 (CYANOCOBALAMIN) 1000 MCG tablet Take 1,000 mcg by mouth daily.    Historical Provider, MD     Physical Exam: Vitals:   11/22/15 1500 11/22/15 1600  11/22/15 1655 11/22/15 1806  BP: (!) 117/106 113/84 106/90   Pulse: 76 85 86 92  Resp: '22 20 21 20  '$ Temp:   98.3 F (36.8 C) 98 F (36.7 C)  TempSrc:   Oral Oral  SpO2: 97% 100% 98% 97%  Weight:    69.2 kg (152 lb 8 oz)  Height:    '5\' 11"'$  (1.803 m)      Constitutional: NAD, calm, comfortable Vitals:   11/22/15 1500 11/22/15 1600 11/22/15 1655 11/22/15 1806  BP: (!) 117/106 113/84 106/90   Pulse: 76 85 86 92  Resp: '22 20 21 20  '$ Temp:   98.3 F (36.8 C) 98 F (36.7 C)  TempSrc:   Oral Oral  SpO2: 97% 100% 98% 97%  Weight:    69.2 kg (152 lb 8 oz)  Height:    '5\' 11"'$  (  1.803 m)   Eyes: Small bump to left temple.   Eyes: Conjunctivae and EOM are normal. Pupils are equal, round, and reactive to light.  Post cataract in L  ENMT: Mucous membranes are moist. Posterior pharynx clear of any exudate or lesions.Normal dentition.  Neck: normal, supple, no masses, no thyromegaly Respiratory: clear to auscultation bilaterally, no wheezing, no crackles. Normal respiratory effort. No accessory muscle use.  Cardiovascular: Regular rate and rhythm, no murmurs / rubs / gallops. No extremity edema. 2+ pedal pulses. No carotid bruits.  Abdomen: no tenderness, no masses palpated. No hepatosplenomegaly. Bowel sounds positive.  Musculoskeletal: no clubbing / cyanosis. No joint deformity upper and lower extremities. Good ROM, no contractures. Normal muscle tone.  Skin: no rashes, lesions, ulcers. No induration Neurologic: CN 2-12 grossly intact. Pronator neg, CN 2-12 normal except blurry vision to L eye, finger to nose dysmetria L > R Psychiatric: Normal judgment and insight. Alert and oriented x 3. Normal mood.     Labs on Admission: I have personally reviewed following labs and imaging studies  CBC:  Recent Labs Lab 11/22/15 1320  WBC 6.1  NEUTROABS 3.9  HGB 12.4  HCT 37.2  MCV 92.3  PLT 165    Basic Metabolic Panel:  Recent Labs Lab 11/22/15 1320  NA 133*  K 4.5  CL 97*  CO2  29  GLUCOSE 102*  BUN 13  CREATININE 0.88  CALCIUM 9.1    GFR: Estimated Creatinine Clearance: 51.1 mL/min (by C-G formula based on SCr of 0.88 mg/dL).  Liver Function Tests:  Recent Labs Lab 11/22/15 1320  AST 21  ALT 15  ALKPHOS 78  BILITOT 0.6  PROT 6.5  ALBUMIN 4.0   No results for input(s): LIPASE, AMYLASE in the last 168 hours. No results for input(s): AMMONIA in the last 168 hours.  Coagulation Profile:  Recent Labs Lab 11/22/15 1320  INR 1.31   No results for input(s): DDIMER in the last 72 hours.  Cardiac Enzymes:  Recent Labs Lab 11/22/15 1320  TROPONINI 0.04*    BNP (last 3 results) No results for input(s): PROBNP in the last 8760 hours.  HbA1C: No results for input(s): HGBA1C in the last 72 hours. Lab Results  Component Value Date   HGBA1C 5.7 02/12/2015     CBG: No results for input(s): GLUCAP in the last 168 hours.  Lipid Profile: No results for input(s): CHOL, HDL, LDLCALC, TRIG, CHOLHDL, LDLDIRECT in the last 72 hours.  Thyroid Function Tests: No results for input(s): TSH, T4TOTAL, FREET4, T3FREE, THYROIDAB in the last 72 hours.  Anemia Panel: No results for input(s): VITAMINB12, FOLATE, FERRITIN, TIBC, IRON, RETICCTPCT in the last 72 hours.  Urine analysis:    Component Value Date/Time   COLORURINE YELLOW 11/22/2015 [place] 11/22/2015 1335   LABSPEC 1.012 11/22/2015 1335   PHURINE 6.0 11/22/2015 1335   GLUCOSEU NEGATIVE 11/22/2015 1335   HGBUR NEGATIVE 11/22/2015 1335   BILIRUBINUR NEGATIVE 11/22/2015 1335   BILIRUBINUR negative 05/26/2015 0916   BILIRUBINUR negative 12/05/2014 1042   KETONESUR NEGATIVE 11/22/2015 1335   PROTEINUR NEGATIVE 11/22/2015 1335   UROBILINOGEN 0.2 05/26/2015 0916   UROBILINOGEN 0.2 02/01/2015 0231   NITRITE NEGATIVE 11/22/2015 1335   LEUKOCYTESUR TRACE (A) 11/22/2015 1335    Sepsis Labs: '@LABRCNTIP'$ (procalcitonin:4,lacticidven:4) )No results found for this or any  previous visit (from the past 240 hour(s)).       Radiological Exams on Admission: Ct Head Wo Contrast  Result Date: 11/22/2015 CLINICAL DATA:  Head trauma last night, striking a door. Dizziness and blurred vision. EXAM: CT HEAD WITHOUT CONTRAST TECHNIQUE: Contiguous axial images were obtained from the base of the skull through the vertex without intravenous contrast. COMPARISON:  MRI 11/23/2013.  CT 11/17/2013. FINDINGS: The brain shows generalized atrophy. There are chronic small-vessel ischemic changes throughout the cerebral hemispheric white matter. No sign of acute infarction, mass lesion, hemorrhage, hydrocephalus or extra-axial collection. No fluid in the sinuses, middle ears or mastoids. No skull fracture. IMPRESSION: No acute or traumatic finding. Atrophy and chronic small vessel ischemic changes, similar to the previous studies. Electronically Signed   By: Nelson Chimes M.D.   On: 11/22/2015 13:35   Ct Head Wo Contrast  Result Date: 11/22/2015 CLINICAL DATA:  Head trauma last night, striking a door. Dizziness and blurred vision. EXAM: CT HEAD WITHOUT CONTRAST TECHNIQUE: Contiguous axial images were obtained from the base of the skull through the vertex without intravenous contrast. COMPARISON:  MRI 11/23/2013.  CT 11/17/2013. FINDINGS: The brain shows generalized atrophy. There are chronic small-vessel ischemic changes throughout the cerebral hemispheric white matter. No sign of acute infarction, mass lesion, hemorrhage, hydrocephalus or extra-axial collection. No fluid in the sinuses, middle ears or mastoids. No skull fracture. IMPRESSION: No acute or traumatic finding. Atrophy and chronic small vessel ischemic changes, similar to the previous studies. Electronically Signed   By: Nelson Chimes M.D.   On: 11/22/2015 13:35      EKG Interpretation  Date/Time:                                      Thursday November 22 2015 12:57:00 EDT Ventricular Rate:             99 PR Interval:                                             QRS Duration:                  79 QT Interval:                                          359 QTC Calculation:              461 R Axis:                                                       58 Text Interpretation:            Atrial fibrillation Borderline low voltage,  Assessment/Plan Active Problems:   DIZZINESS patient has a history of atrial fibrillation, this is not new Patient states that she does not take any medications for her A. fib and this is rate control We'll admit to telemetry for observation, continue to cycle cardiac enzymes 2-D echo to rule out wall motion abnormalities, TSH Patient sees Dr. Einar Gip, and she would like him to be notified    GERD (gastroesophageal reflux disease)-this appears to be stable, patient is not on a PPI at home  Elevated troponin-doubt ACS, will start patient on aspirin, 2-D echo to rule out wall motion abnormalities, if abnormal, formal cardiology consultation    COPD (chronic obstructive pulmonary disease) (HCC)-this appears to be stable without exacerbation    Blurry vision/near syncope-patient has been scheduled to have an MRI of the brain, carotid Doppler, chest x-ray and she has had a slight cough, will check orthostatics and hydrate, 2-D echo  Atrial fibrillation-rate controlled, continue eliquis   DVT prophylaxis: eliquis   Code Status History full code    Date Active Date Inactive Code Status Order ID Comments User Context   01/31/2015  6:05 PM 02/02/2015  5:52 PM Full Code [id number]  Willia Craze, NP Inpatient      Family Communication: discussed with patient  By the bedside   Disposition Plan:  Anticipate discharge in 2-3 days pending further workup and clinical progress  Consults called: none   Admission status:  Observation  Total time spent 55 minutes.Greater than 50% of this time was spent in counseling, explanation of diagnosis, planning of further management, and coordination of  care  [facility] MD [facility] Pager [phone]  If 7PM-7AM, please contact night-coverage [email or web address] Password [email or web address]  11/22/2015, 6:26 PM

## 2015-11-22 NOTE — Plan of Care (Signed)
Problem: Tissue Perfusion: Goal: Risk factors for ineffective tissue perfusion will decrease Outcome: Not Met (add Reason) Patient left AMA

## 2015-11-22 NOTE — Care Management Note (Signed)
Case Management Note  Patient Details  Name: [name] MRN: [id number] Date of Birth: Mar 02, 1928  Subjective/Objective:                  Chief Complaint: Dizziness  Action/Plan: Cm spoke to patient at the bedside regarding her discharge plan. Per patient report, Patient fell last night and hit her head and then noted some dizziness today. Patient had questions about her admission status. CM explained that at this time, patient does not meet criteria for inpatient admission therefore is in observation status. Patient said that the last time she was in observation status she ended up having to pay the entire bill. Patient has medicare along with a [employer] supplement. She shared that despite that she still got a bill and can't afford to take a chance this time and get a bill. Cm encouraged member to re file that bill and appeal. Daughter told the same thing. CM explained in depth the implications for signing out AMA including the risk of head bleed due to fall/head injury being increased by patient being on eliquis blood thinner. Patient verbalized understanding and said that even with the risk of bleeding and additional falls, she feels that she wants to go home this evening. Patient is appropriate and A&Ox4.  Patient lives alone and said that there is no one to stay with her but despite that, she wants to go home. Cm spoke to patient about safety and she said that she has just finished with outpatient PT last week and has removed all throw rugs and cords from around the house. Patient has a cane at the bedside. Cm offered to set patient up with [facility] RN/PT/OT and patient said that she did not feel that was necessary. Cm encouraged patient to call 911 at home in the event of changes or an emergency. CM, Lucia Bitter, called daughter also to update per patient request on the status of obs and patient wanting to leave AMA. Patient was planning to call a friend for a ride home, Cm remains available for  further discharge planning needs or assistance.   Expected Discharge Date:  11/22/15               Expected Discharge Plan:     In-House Referral:     Discharge [place] with self care  Post Acute Care Choice:    Choice offered to:     DME Arranged:    DME Agency:     HH Arranged:    HH Agency:     Status of Service:     If discussed at [employer], dates discussed:    Additional Comments:  Guido Sander, RN 11/22/2015, 9:06 PM

## 2015-11-22 NOTE — ED Provider Notes (Signed)
[facility] Provider Note   CSN: [id number] Arrival date & time: 11/22/15  1221  First Provider Contact:  None       History   Chief Complaint Chief Complaint  Patient presents with  . Head Injury    HPI SHIFRA SWARTZENTRUBER is a 82 y.o. female.  HPI   Patient is an 82 year old female with history of anxiety, A. fib on elaquist, COPD, Parkinson's, HLD, presenting today with dizziness and fall. Patient reports that at 4 PM yesterday she did start to experience dizziness. She says that she tried to eat a hamburger at [employer] as well as drink plenty of fluids and salt which usually makes it better. Nothing however made it better. Patient got up in the middle the night and felt incredibly dizzy and pitched forward and fell down and hit her head on the left-hand side. She reports that since then she has had blurry vision to the left eye (on exam its on the right side). Patient recently had surgery for macular puckering on the left eye.   Patinet is an hard historian  Patiently initially said she had no history of dizziness however she said that she took 2 meclizine today for the dizziness ( indicating that she has a history of dizziness)  Patient perseverating on the fact that she wants to go to [employer]. Difficult to get good history from patient.  Patient has a neurologist for her Parkinson's. Patient says she "has been denying the fact that I have Parkinson's, but all the doctor tell me I do."  Past Medical History:  Diagnosis Date  . Anxiety   . Arthritis   . Atrial fibrillation ([place])   . Cardiac arrhythmia due to congenital heart disease   . Chronic constipation   . COPD (chronic obstructive pulmonary disease) ([place])   . Depression   . GERD (gastroesophageal reflux disease)   . Hemorrhoid   . Hyperlipidemia   . Mycotic toenails 10/27/2012  . Parkinson disease ([place])   . Parkinsonism ([place])   . Recurrent UTI   . Tubulovillous adenoma of colon  02/1992  . Varicose veins     Patient Active Problem List   Diagnosis Date Noted  . Acute bacterial bronchitis 07/01/2015  . Arterial hypotension 06/18/2015  . Influenza 06/13/2015  . PCP NOTES >>>>>>>>>>>>>>>>>>>>>>> 03/02/2015  . Atrial fibrillation with RVR ([place]) 01/31/2015  . Elevated troponin 01/31/2015  . COPD (chronic obstructive pulmonary disease) ([place]) 01/31/2015  . Lower abdominal pain 12/05/2014  . Other fatigue 06/21/2014  . Lightheadedness 06/12/2014  . Fall from other slipping, tripping, or stumbling 11/17/2013  . Concussion without loss of consciousness 11/17/2013  . COPD exacerbation ([place]) 09/26/2013  . Sinusitis, acute maxillary 07/19/2012  . Musculoskeletal pain 05/21/2012  . IBS (irritable bowel syndrome) 04/02/2012  . Ear bleeding 12/10/2011  . Physical exam, annual 11/25/2011  . Generalized anxiety disorder 07/10/2011  . Parkinson's disease ([place]) 02/03/2011  . Shoulder pain 02/03/2011  . Recurrent UTI 11/01/2010  . GERD (gastroesophageal reflux disease) 11/01/2010  . Memory loss 11/01/2010  . Hip pain, right 09/23/2010  . COMPRESSION FRACTURE, LUMBAR VERTEBRAE 03/28/2010  . Abdominal pain 03/27/2010  . DIZZINESS 03/22/2010  . Mycotic toenails 10/02/2009  . DIARRHEA-PRESUMED INFECTIOUS 04/02/2009  . DYSPNEA ON EXERTION 01/19/2009  . HEMORRHOIDS-INTERNAL 02/14/2008  . CONSTIPATION 02/14/2008  . PERSONAL HX COLONIC POLYPS 02/14/2008    Past Surgical History:  Procedure Laterality Date  . APPENDECTOMY  82 years old  . COLON  RESECTION  2008  . vitriectomy  08-2015    OB History    No data available       Home Medications    Prior to Admission medications   Medication Sig Start Date End Date Taking? Authorizing Provider  albuterol (VENTOLIN HFA) 108 (90 BASE) MCG/ACT inhaler Inhale 2 puffs into the lungs 2 (two) times daily as needed for wheezing or shortness of breath. 02/05/15  Yes Midge Minium, MD  apixaban (ELIQUIS) 5 MG TABS tablet  Take 5 mg by mouth 2 (two) times daily.   Yes Historical Provider, MD  Ascorbic Acid (VITAMIN C) 1000 MG tablet Take 1,000 mg by mouth daily.   Yes Historical Provider, MD  flecainide (TAMBOCOR) 100 MG tablet Take 50 mg by mouth daily as needed. Reported on 05/26/2015   Yes Historical Provider, MD  meclizine (ANTIVERT) 25 MG tablet Take 50 mg by mouth 3 (three) times daily as needed for dizziness.   Yes Historical Provider, MD  sertraline (ZOLOFT) 100 MG tablet Take 1.5 tablets (150 mg total) by mouth daily. 08/27/15  Yes Colon Branch, MD  SYMBICORT 160-4.5 MCG/ACT inhaler INHALE 2 PUFFS BY MOUTH INTO THE LUNGS TWICE DAILY. 11/09/15  Yes Brunetta Jeans, PA-C  azelastine (ASTELIN) 0.1 % nasal spray Place 2 sprays into both nostrils at bedtime as needed for rhinitis. Use in each nostril as directed 04/11/15   Colon Branch, MD  Calcium Carb-Cholecalciferol (CALCIUM 1000 + D PO) Take 1,000 mg by mouth daily.    Historical Provider, MD  diltiazem (CARDIZEM) 30 MG tablet Take 1 tablet (30 mg total) by mouth 4 (four) times daily. Take at onset on atrial fibrillation Patient taking differently: Take 30 mg by mouth as needed. Take at onset on atrial fibrillation 02/02/15   Orson Eva, MD  trimethoprim (TRIMPEX) 100 MG tablet Take 100 mg by mouth daily. Reported on 10/03/2015    Historical Provider, MD  vitamin B-12 (CYANOCOBALAMIN) 1000 MCG tablet Take 1,000 mcg by mouth daily.    Historical Provider, MD    Family History Family History  Problem Relation Age of Onset  . Asthma Brother   . Alcohol abuse Brother   . Throat cancer Father   . Alcohol abuse Father   . Lupus Daughter   . Bipolar disorder Daughter   . Lupus Daughter   . Anxiety disorder Daughter   . Alcohol abuse Sister   . Alcohol abuse Maternal Grandfather   . Alcohol abuse Paternal Grandmother   . Breast cancer Sister   . Colon cancer Neg Hx     Social History Social History  Substance Use Topics  . Smoking status: Former Smoker     Packs/day: 2.00    Years: 30.00    Types: Cigarettes    Quit date: 04/21/1980  . Smokeless tobacco: Never Used     Comment: onset age 73 -75, up to > 1ppd (almost 2 ppd)  . Alcohol use No     Allergies   Adhesive [tape]; Amoxicillin; Ciprofloxacin; and Ultram [tramadol]   Review of Systems Review of Systems  Constitutional: Negative for activity change.  Respiratory: Negative for shortness of breath.   Cardiovascular: Negative for chest pain.  Gastrointestinal: Negative for abdominal pain.  Musculoskeletal: Positive for gait problem.  Neurological: Positive for dizziness and headaches.  All other systems reviewed and are negative.    Physical Exam Updated Vital Signs BP 102/74 (BP Location: Right Arm)   Pulse 110   Temp 98.1  F (36.7 C) (Oral)   Resp 18   SpO2 98%   Physical Exam  Constitutional: She appears well-developed and well-nourished. No distress.  HENT:  Head: Normocephalic.  Small bump to left temple.   Eyes: Conjunctivae and EOM are normal. Pupils are equal, round, and reactive to light.  Post cataract in L  Neck: Neck supple.  Cardiovascular: Normal rate and regular rhythm.   No murmur heard. Pulmonary/Chest: Effort normal and breath sounds normal. No respiratory distress.  Abdominal: Soft. There is no tenderness.  Musculoskeletal: She exhibits no edema.  Neurological: She is alert.  Pronator neg, CN 2-12 normal except blurry vision to L eye, finger to nose dysmetria L > R  Patient with steady, quick gait  Skin: Skin is warm and dry.  Psychiatric: She has a normal mood and affect.  Nursing note and vitals reviewed.    ED Treatments / Results  Labs (all labs ordered are listed, but only abnormal results are displayed) Labs Reviewed  COMPREHENSIVE METABOLIC PANEL - Abnormal; Notable for the following:       Result Value   Sodium 133 (*)    Chloride 97 (*)    Glucose, Bld 102 (*)    GFR calc non Af Amer 58 (*)    All other components within  normal limits  URINALYSIS, ROUTINE W REFLEX MICROSCOPIC (NOT AT [facility]) - Abnormal; Notable for the following:    Leukocytes, UA TRACE (*)    All other components within normal limits  PROTIME-INR - Abnormal; Notable for the following:    Prothrombin Time 16.4 (*)    All other components within normal limits  URINE MICROSCOPIC-ADD ON - Abnormal; Notable for the following:    Squamous Epithelial / LPF 0-5 (*)    Bacteria, UA RARE (*)    All other components within normal limits  URINE CULTURE  CBC WITH DIFFERENTIAL/PLATELET  TROPONIN I    EKG  EKG Interpretation  Date/Time:  Thursday November 22 2015 12:57:00 EDT Ventricular Rate:  99 PR Interval:    QRS Duration: 79 QT Interval:  359 QTC Calculation: 461 R Axis:   58 Text Interpretation:  Atrial fibrillation Borderline low voltage, extremity leads RSR' in V1 or V2, right VCD or RVH Atrial fibrillation Confirmed by Gerald Leitz ([id number]) on 11/22/2015 2:16:19 PM       Radiology Ct Head Wo Contrast  Result Date: 11/22/2015 CLINICAL DATA:  Head trauma last night, striking a door. Dizziness and blurred vision. EXAM: CT HEAD WITHOUT CONTRAST TECHNIQUE: Contiguous axial images were obtained from the base of the skull through the vertex without intravenous contrast. COMPARISON:  MRI 11/23/2013.  CT 11/17/2013. FINDINGS: The brain shows generalized atrophy. There are chronic small-vessel ischemic changes throughout the cerebral hemispheric white matter. No sign of acute infarction, mass lesion, hemorrhage, hydrocephalus or extra-axial collection. No fluid in the sinuses, middle ears or mastoids. No skull fracture. IMPRESSION: No acute or traumatic finding. Atrophy and chronic small vessel ischemic changes, similar to the previous studies. Electronically Signed   By: Nelson Chimes M.D.   On: 11/22/2015 13:35    Procedures Procedures (including critical care time)  Medications Ordered in ED Medications - No data to display   Initial  Impression / Assessment and Plan / ED Course  I have reviewed the triage vital signs and the nursing notes.  Pertinent labs & imaging results that were available during my care of the patient were reviewed by me and considered in my medical decision making (see  chart for details).  Clinical Course    Patient is an 82 year old female presenting today after fall. Patient fell yesterday evening and struck her head. She is here because she is on Ehlquist was concerned about a head bleed. However patient has been feeling mildly dizzy at home. She is a very difficult historian and it is unclear how symptoms have been progressing. It appears that shes had dizziness that has  been waxing waning over possibly months to years. The blurriness of vision is actually in her right eye which is not the eye that her surgery was done on. THis is completely knew since yesterday.  She reports it "kind of fuzzy around the edges".   Her dizziness has resolved and she has steady gait now.  Given the fact the patient is a difficult historian , this MD wanted to involve patient's daughter. I talked to the Bethann Berkshire her daughter about the risks and benefits of being transferred to [facility] for an MRI to rule out stroke. Patient is already on Eloquisat. .I thought at her age it may be the benefit of going on this trip may outweigh the risk of potentially having had a stroke vs TIA.   However, then her elevated trop came back.  I rediscussed with family and patient.  We will admit to medicine to trend trops and get MRI.     Final Clinical Impressions(s) / ED Diagnoses   Final diagnoses:  None    New Prescriptions New Prescriptions   No medications on file     Nayla Dias Julio Alm, MD 11/22/15 1431

## 2015-11-22 NOTE — Plan of Care (Signed)
Problem: Physical Regulation: Goal: Ability to maintain clinical measurements within normal limits will improve Outcome: Not Met (add Reason) Patient left AMA     

## 2015-11-22 NOTE — Progress Notes (Signed)
Report was received in patient's room via [employer] using [employer], reviewed VS, meds, PMH, and reason for patient coming to the hospital. Patient is very concerned about staying D/T the fact that she is still paying a bill for her last Observation stay in October 2016 because Medicare and her secondary insurance denied payment. Case Management called and will be up to see her shortly and [facility] was text paged regarding the possibility of patient signing out AMA, will continue to monitor and will let [name] the charge nurse know also.

## 2015-11-22 NOTE — ED Notes (Signed)
Called to give report to RN at [facility]. Left number with Janett Billow, RN and she is to call back.

## 2015-11-22 NOTE — Progress Notes (Signed)
After 3 attempts Dr Theda Sers called back and was informed of the situation.  She said to explain what her risks were if she left AMA and if she still wanted to go to have her sign the paperwork. [name] (Case Management) came up to see patient and reviewed her options and the patient still wants to go home if they can't change her status to an admission so as not to acrue another bill. He IV was removed with catheter intact and a reinforced dressing placed to keep from bleeding. Patient was taken off the monitor and Central Monitor was called. Patient will get dressed and call when ready and we will take her down to meet her friend in the ED.

## 2015-11-22 NOTE — Progress Notes (Signed)
   Pt coming from [facility] for Tele obs admission due to syncope w/ new onset L eye blurry vision and dysmetria on L.  and elevated troponin. CT head w/o acute process. Troponin 0.04.  Pt needs to have trop cycled and MRI/syncope workup.  Linna Darner, MD [facility] 11/22/2015, 2:46 PM

## 2015-11-22 NOTE — ED Notes (Signed)
Ambulated patient with MD at bedside. Pt using cane. Ambulated w/o difficulty and great speed.

## 2015-11-22 NOTE — Plan of Care (Signed)
Problem: Safety: Goal: Ability to remain free from injury will improve Outcome: Not Met (add Reason) Patient left AMA

## 2015-11-22 NOTE — Plan of Care (Signed)
Problem: Fluid Volume: Goal: Ability to maintain a balanced intake and output will improve Outcome: Not Met (add Reason) Patient left AMA

## 2015-11-22 NOTE — ED Triage Notes (Signed)
Pt states she has history of dizziness.  Pt hit her head on door jamb last night.  No LOC.  Pt continues to have headache, blurry vision.  Pt concerned as she is on Eliquis.

## 2015-11-22 NOTE — ED Notes (Signed)
MD at bedside. 

## 2015-11-23 LAB — URINE CULTURE

## 2015-11-24 ENCOUNTER — Encounter (HOSPITAL_COMMUNITY): Payer: Self-pay

## 2015-11-24 ENCOUNTER — Emergency Department (HOSPITAL_COMMUNITY): Payer: Medicare Other

## 2015-11-24 ENCOUNTER — Emergency Department (HOSPITAL_COMMUNITY)
Admission: EM | Admit: 2015-11-24 | Discharge: 2015-11-24 | Disposition: A | Discharge status: Home / Self Care | Payer: Medicare Other | Attending: Emergency Medicine | Admitting: Emergency Medicine

## 2015-11-24 DIAGNOSIS — W228XXA Striking against or struck by other objects, initial encounter: Secondary | ICD-10-CM | POA: Insufficient documentation

## 2015-11-24 DIAGNOSIS — R2689 Other abnormalities of gait and mobility: Secondary | ICD-10-CM | POA: Diagnosis not present

## 2015-11-24 DIAGNOSIS — J449 Chronic obstructive pulmonary disease, unspecified: Secondary | ICD-10-CM | POA: Insufficient documentation

## 2015-11-24 DIAGNOSIS — Z87891 Personal history of nicotine dependence: Secondary | ICD-10-CM | POA: Diagnosis not present

## 2015-11-24 DIAGNOSIS — G20 Parkinson's disease: Secondary | ICD-10-CM | POA: Insufficient documentation

## 2015-11-24 DIAGNOSIS — Y999 Unspecified external cause status: Secondary | ICD-10-CM | POA: Insufficient documentation

## 2015-11-24 DIAGNOSIS — Y939 Activity, unspecified: Secondary | ICD-10-CM | POA: Diagnosis not present

## 2015-11-24 DIAGNOSIS — Y929 Unspecified place or not applicable: Secondary | ICD-10-CM | POA: Diagnosis not present

## 2015-11-24 DIAGNOSIS — Z7901 Long term (current) use of anticoagulants: Secondary | ICD-10-CM | POA: Insufficient documentation

## 2015-11-24 DIAGNOSIS — R404 Transient alteration of awareness: Secondary | ICD-10-CM | POA: Diagnosis not present

## 2015-11-24 DIAGNOSIS — R42 Dizziness and giddiness: Secondary | ICD-10-CM | POA: Insufficient documentation

## 2015-11-24 LAB — TROPONIN I: Troponin I: <0.03 ng/mL (ref <0.03)

## 2015-11-24 MED ORDER — MECLIZINE HCL 25 MG PO TABS
25.0000 mg | ORAL_TABLET | Freq: Once | ORAL | Status: AC
  Administered 2015-11-24: 25 mg via ORAL
  Filled 2015-11-24: qty 1

## 2015-11-24 MED ORDER — MECLIZINE HCL 25 MG PO TABS: ORAL_TABLET | ORAL | 0 refills | Status: DC

## 2015-11-24 NOTE — ED Provider Notes (Signed)
[facility] DEPT Provider Note   CSN: [id number] Arrival date & time: 11/24/15  1142  First Provider Contact:  11:55 AM      History   Chief Complaint Chief Complaint  Patient presents with  . Dizziness    HPI [name] is a 82 y.o. female.  HPI patient reports she started having feeling of being dizzy on August 3. It started about 4 PM in the afternoon and she felt like she was falling. She states she feels like she's off balance. She denies a spinning sensation. She states about 2 AM that night she got up to use the bathroom and when she turned she fell and hit the left side of her head on the door jam. She reports she had a goose ache there. She was still feeling dizzy yesterday, August 4 and she went to the [facility]. She is concerned she is on Eliquis because of chronic atrial fib. She had a head CT done that was reported normal. She also had blood work done and was told "enzyme was high. She was transferred over to [facility] and they have discussed being admitted to observation. However patient did not want to be admitted to observation because her insurance will pay for it. Therefore she left around 10 PM. She denies any numbness or tingling in her extremities. She only has a headache where she actually hit it. She states she feels like she falls to the left. She denies nausea or vomiting. She states she did have some blurred vision. She had some old meclizine that she took yesterday and took 2 this morning, she states there is minimal improvement with what she took this morning. Patient states she is right-handed.  Patient also states for the past month she has been weak. She went to physical therapy about 2 weeks ago and also did some type of balance training. So it sounds like she's had some balance problems anyway. Patient is also being treated for Parkinson's.   PCP Dr Larose K[facility] Dr Gwendel Hanson   Past Medical History:  Diagnosis Date  . Anxiety     . Arthritis   . Atrial fibrillation ([place])   . Cardiac arrhythmia due to congenital heart disease   . Chronic constipation   . COPD (chronic obstructive pulmonary disease) ([place])   . Depression   . GERD (gastroesophageal reflux disease)   . Hemorrhoid   . Hyperlipidemia   . Lightheadedness 11/2015  . Mycotic toenails 10/27/2012  . Parkinson disease ([place])   . Parkinsonism ([place])   . Recurrent UTI   . 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 Varicose veins     Patient Active Problem List   Diagnosis Date Noted  . Chest pain 11/22/2015  . Blurry vision, right eye   . Acute bacterial bronchitis 07/01/2015  . Arterial hypotension 06/18/2015  . Influenza 06/13/2015  . PCP NOTES >>>>>>>>>>>>>>>>>>>>>>> 03/02/2015  . Atrial fibrillation with RVR ([place]) 01/31/2015  . Elevated troponin 01/31/2015  . COPD (chronic obstructive pulmonary disease) ([place]) 01/31/2015  . Lower abdominal pain 12/05/2014  . Other fatigue 06/21/2014  . Lightheadedness 06/12/2014  . Fall from other slipping, tripping, or stumbling 11/17/2013  . Concussion without loss of consciousness 11/17/2013  . COPD exacerbation ([place]) 09/26/2013  . Sinusitis, acute maxillary 07/19/2012  . Musculoskeletal pain 05/21/2012  . IBS (irritable bowel syndrome) 04/02/2012  . Ear bleeding 12/10/2011  . Physical exam, annual 11/25/2011  . Generalized anxiety disorder 07/10/2011  . Parkinson's  disease ([place]) 02/03/2011  . Shoulder pain 02/03/2011  . Recurrent UTI 11/01/2010  . GERD (gastroesophageal reflux disease) 11/01/2010  . Memory loss 11/01/2010  . Hip pain, right 09/23/2010  . COMPRESSION FRACTURE, LUMBAR VERTEBRAE 03/28/2010  . Abdominal pain 03/27/2010  . DIZZINESS 03/22/2010  . Mycotic toenails 10/02/2009  . DIARRHEA-PRESUMED INFECTIOUS 04/02/2009  . DYSPNEA ON EXERTION 01/19/2009  . HEMORRHOIDS-INTERNAL 02/14/2008  . CONSTIPATION 02/14/2008  . PERSONAL HX COLONIC POLYPS 02/14/2008    Past Surgical History:   Procedure Laterality Date  . APPENDECTOMY  82 years old  . COLON RESECTION  2008  . vitriectomy  08-2015    OB History    No data available       Home Medications    Prior to Admission medications   Medication Sig Start Date End Date Taking? Authorizing Provider  albuterol (VENTOLIN HFA) 108 (90 BASE) MCG/ACT inhaler Inhale 2 puffs into the lungs 2 (two) times daily as needed for wheezing or shortness of breath. 02/05/15   Midge Minium, MD  apixaban (ELIQUIS) 5 MG TABS tablet Take 5 mg by mouth 2 (two) times daily.    Historical Provider, MD  Ascorbic Acid (VITAMIN C) 1000 MG tablet Take 1,000 mg by mouth daily.    Historical Provider, MD  azelastine (ASTELIN) 0.1 % nasal spray Place 2 sprays into both nostrils at bedtime as needed for rhinitis. Use in each nostril as directed 04/11/15   Colon Branch, MD  Calcium Carb-Cholecalciferol (CALCIUM 1000 + D PO) Take 1,000 mg by mouth daily.    Historical Provider, MD  diltiazem (CARDIZEM) 30 MG tablet Take 1 tablet (30 mg total) by mouth 4 (four) times daily. Take at onset on atrial fibrillation Patient taking differently: Take 30 mg by mouth as needed. Take at onset on atrial fibrillation 02/02/15   Orson Eva, MD  meclizine (ANTIVERT) 25 MG tablet Take 1 or 2 po Q 6hrs for dizziness 11/24/15   Rolland Porter, MD  Propylene Glycol (SYSTANE BALANCE OP) Apply 2 drops to eye 3 (three) times daily as needed.    Historical Provider, MD  sertraline (ZOLOFT) 100 MG tablet Take 1.5 tablets (150 mg total) by mouth daily. Patient taking differently: Take 100 mg by mouth daily.  08/27/15   Colon Branch, MD  SYMBICORT 160-4.5 MCG/ACT inhaler INHALE 2 PUFFS BY MOUTH INTO THE LUNGS TWICE DAILY. 11/09/15   Brunetta Jeans, PA-C  vitamin B-12 (CYANOCOBALAMIN) 1000 MCG tablet Take 1,000 mcg by mouth daily.    Historical Provider, MD    Family History Family History  Problem Relation Age of Onset  . Asthma Brother   . Alcohol abuse Brother   . Throat cancer  Father   . Alcohol abuse Father   . Lupus Daughter   . Bipolar disorder Daughter   . Lupus Daughter   . Anxiety disorder Daughter   . Alcohol abuse Sister   . Alcohol abuse Maternal Grandfather   . Alcohol abuse Paternal Grandmother   . Breast cancer Sister   . Colon cancer Neg Hx     Social History Social History  Substance Use Topics  . Smoking status: Former Smoker    Packs/day: 2.00    Years: 30.00    Types: Cigarettes    Quit date: 04/21/1980  . Smokeless tobacco: Never Used     Comment: onset age 58 -70, up to > 1ppd (almost 2 ppd)  . Alcohol use No  lives at home Lives alone  Allergies   Adhesive [tape]; Amoxicillin; Ciprofloxacin; and Ultram [tramadol]   Review of Systems Review of Systems  All other systems reviewed and are negative.    Physical Exam Updated Vital Signs BP 101/76 (BP Location: Right Arm)   Pulse 96   Temp 98 F (36.7 C) (Oral)   Resp 24   SpO2 97%   Vital signs normal    Physical Exam  Constitutional: She is oriented to person, place, and time. She appears well-developed and well-nourished.  Non-toxic appearance. She does not appear ill. No distress.  HENT:  Head: Normocephalic and atraumatic.  Right Ear: External ear normal.  Left Ear: External ear normal.  Nose: Nose normal. No mucosal edema or rhinorrhea.  Mouth/Throat: Oropharynx is clear and moist and mucous membranes are normal. No dental abscesses or uvula swelling.  Eyes: Conjunctivae and EOM are normal. Pupils are equal, round, and reactive to light.  Neck: Normal range of motion and full passive range of motion without pain. Neck supple.  Cardiovascular: Normal rate, regular rhythm and normal heart sounds.  Exam reveals no gallop and no friction rub.   No murmur heard. Pulmonary/Chest: Effort normal and breath sounds normal. No respiratory distress. She has no wheezes. She has no rhonchi. She has no rales. She exhibits no tenderness and no crepitus.    Abdominal:  Soft. Normal appearance and bowel sounds are normal. She exhibits no distension. There is no tenderness. There is no rebound and no guarding.  Musculoskeletal: Normal range of motion. She exhibits no edema or tenderness.  Moves all extremities well.   Neurological: She is alert and oriented to person, place, and time. She has normal strength. No cranial nerve deficit.  Finger to nose is coordinated with mild tremor because of her Parkinson's. She has no focal weakness.  Skin: Skin is warm, dry and intact. No rash noted. No erythema. No pallor.  Psychiatric: She has a normal mood and affect. Her speech is normal and behavior is normal. Her mood appears not anxious.  Nursing note and vitals reviewed.    ED Treatments / Results  Labs   Results for orders placed or performed during the hospital encounter of 11/24/15  Troponin I  Result Value Ref Range   Troponin I <0.03 <0.03 ng/mL    Laboratory interpretation all normal    (all labs ordered are listed, but only abnormal results are displayed) Results for orders placed or performed during the hospital encounter of 11/22/15  Urine culture  Result Value Ref Range   Specimen Description URINE, RANDOM    Special Requests NONE    Culture (A)     <10,000 COLONIES/mL INSIGNIFICANT GROWTH Performed at [facility]    Report Status 11/23/2015 FINAL   Comprehensive metabolic panel  Result Value Ref Range   Sodium 133 (L) 135 - 145 mmol/L   Potassium 4.5 3.5 - 5.1 mmol/L   Chloride 97 (L) 101 - 111 mmol/L   CO2 29 22 - 32 mmol/L   Glucose, Bld 102 (H) 65 - 99 mg/dL   BUN 13 6 - 20 mg/dL   Creatinine, Ser 0.88 0.44 - 1.00 mg/dL   Calcium 9.1 8.9 - 10.3 mg/dL   Total Protein 6.5 6.5 - 8.1 g/dL   Albumin 4.0 3.5 - 5.0 g/dL   AST 21 15 - 41 U/L   ALT 15 14 - 54 U/L   Alkaline Phosphatase 78 38 - 126 U/L   Total Bilirubin 0.6 0.3 - 1.2 mg/dL  GFR calc non Af Amer 58 (L) >60 mL/min   GFR calc Af Amer >60 >60 mL/min   Anion gap  7 5 - 15  CBC with Differential/Platelet  Result Value Ref Range   WBC 6.1 4.0 - 10.5 K/uL   RBC 4.03 3.87 - 5.11 MIL/uL   Hemoglobin 12.4 12.0 - 15.0 g/dL   HCT 37.2 36.0 - 46.0 %   MCV 92.3 78.0 - 100.0 fL   MCH 30.8 26.0 - 34.0 pg   MCHC 33.3 30.0 - 36.0 g/dL   RDW 15.3 11.5 - 15.5 %   Platelets 186 150 - 400 K/uL   Neutrophils Relative % 61 %   Lymphocytes Relative 26 %   Monocytes Relative 7 %   Eosinophils Relative 1 %   Basophils Relative 1 %   Band Neutrophils 4 %   Metamyelocytes Relative 0 %   Myelocytes 0 %   Promyelocytes Absolute 0 %   Blasts 0 %   nRBC 1 (H) 0 /100 WBC   Neutro Abs 3.9 1.7 - 7.7 K/uL   Lymphs Abs 1.6 0.7 - 4.0 K/uL   Monocytes Absolute 0.4 0.1 - 1.0 K/uL   Eosinophils Absolute 0.1 0.0 - 0.7 K/uL   Basophils Absolute 0.1 0.0 - 0.1 K/uL   RBC Morphology RARE NRBCs   Troponin I  Result Value Ref Range   Troponin I 0.04 (HH) <0.03 ng/mL  Urinalysis, Routine w reflex microscopic (not at [facility])  Result Value Ref Range   Color, Urine YELLOW YELLOW   APPearance CLEAR CLEAR   Specific Gravity, Urine 1.012 1.005 - 1.030   pH 6.0 5.0 - 8.0   Glucose, UA NEGATIVE NEGATIVE mg/dL   Hgb urine dipstick NEGATIVE NEGATIVE   Bilirubin Urine NEGATIVE NEGATIVE   Ketones, ur NEGATIVE NEGATIVE mg/dL   Protein, ur NEGATIVE NEGATIVE mg/dL   Nitrite NEGATIVE NEGATIVE   Leukocytes, UA TRACE (A) NEGATIVE  Protime-INR  Result Value Ref Range   Prothrombin Time 16.4 (H) 11.4 - 15.2 seconds   INR 1.31   Urine microscopic-add on  Result Value Ref Range   Squamous Epithelial / LPF 0-5 (A) NONE SEEN   WBC, UA 0-5 0 - 5 WBC/hpf   RBC / HPF 0-5 0 - 5 RBC/hpf   Bacteria, UA RARE (A) NONE SEEN   Urine-Other MUCOUS PRESENT        EKG  EKG Interpretation None       Radiology  Mr Brain Wo Contrast  Result Date: 11/24/2015 CLINICAL DATA:  Worsening dizziness. Off balance for 3 days. Fell. Hit head on door jam. On Eliquis. EXAM: MRI HEAD WITHOUT CONTRAST  TECHNIQUE: Multiplanar, multiecho pulse sequences of the brain and surrounding structures were obtained without intravenous contrast. COMPARISON:  Head CT 11/22/2015 and MRI 11/23/2013 FINDINGS: There is no evidence of acute infarct, intracranial hemorrhage, mass, midline shift, or extra-axial fluid collection. There is moderate cerebral atrophy. Cerebral white matter T2 hyperintensities are confluent in the periventricular regions and compatible with moderate chronic small vessel ischemic disease, unchanged from the prior MRI. Mild chronic small vessel ischemic changes are noted in the pons. Prior bilateral cataract extraction is noted. There is a small, chronic left mastoid effusion. The paranasal sinuses are clear. Major intracranial vascular flow voids are preserved. Diffusely heterogeneous bone marrow signal without suspicious focal lesion. IMPRESSION: 1. No acute intracranial abnormality. 2. Moderate chronic small vessel ischemic disease. Electronically Signed   By: Logan Bores M.D.   On: 11/24/2015 15:27  Ct Head Wo Contrast  Result Date: 11/22/2015 CLINICAL DATA:  Head trauma last night, striking a door. Dizziness and blurred vision. EXAM: CT HEAD WITHOUT CONTRAST TECHNIQUE: Contiguous axial images were obtained from the base of the skull through the vertex without intravenous contrast. COMPARISON:  MRI 11/23/2013.  CT 11/17/2013. FINDINGS: The brain shows generalized atrophy. There are chronic small-vessel ischemic changes throughout the cerebral hemispheric white matter. No sign of acute infarction, mass lesion, hemorrhage, hydrocephalus or extra-axial collection. No fluid in the sinuses, middle ears or mastoids. No skull fracture. IMPRESSION: No acute or traumatic finding. Atrophy and chronic small vessel ischemic changes, similar to the previous studies. Electronically Signed   By: Nelson Chimes M.D.   On: 11/22/2015 13:35    Procedures Procedures (including critical care time)  Medications  Ordered in ED Medications  meclizine (ANTIVERT) tablet 25 mg (25 mg Oral Given 11/24/15 1313)     Initial Impression / Assessment and Plan / ED Course  I have reviewed the triage vital signs and the nursing notes.  Pertinent labs & imaging results that were available during my care of the patient were reviewed by me and considered in my medical decision making (see chart for details).  Clinical Course   We discussed getting a MRI since patient has a feeling of being off balance and falling I am concerned of a cerebellar problem such as a stroke. She is agreeable. We also will try a meclizine that's not cold to see if that helps with her symptoms. Review of her labs shows she did have a mildly positive troponin yesterday, that was repeated today. I did not repeat her other laboratory testing from yesterday.  At time of discharge we discussed her MRI results. Patient is happy about the results. She has some old meclizine that is a couple years old at home. She was given a new prescription to use. She is advised to return to the ED if she is unable to walk, she has a cane or walker to use at home, or she seems worse.  Final Clinical Impressions(s) / ED Diagnoses   Final diagnoses:  Balance problem  Dizziness    New Prescriptions New Prescriptions   MECLIZINE (ANTIVERT) 25 MG TABLET    Take 1 or 2 po Q 6hrs for dizziness    Plan discharge  Rolland Porter, MD, Barbette Or, MD 11/24/15 [id number]

## 2015-11-24 NOTE — ED Triage Notes (Signed)
Pt presents to the ed with complaints of feeling dizzy. She was seen on the 3 after hitting her head on a door jam, they did a head ct because she is on blood thinners and wanted to admit her for observation, however her insurance does not cover observation so she went home. Her dizziness is getting progressively worse so she came here today

## 2015-11-24 NOTE — Discharge Instructions (Signed)
Take the meclizine for dizziness. Use your cane and walker to help with the dizziness.  Recheck if you get a headache, new numbness or weakness in your arms or legs, or the dizziness is so bad you are unable to walk or take care of your self.

## 2015-11-24 NOTE — ED Notes (Signed)
Pt returned from mri, placed back on monitor

## 2015-11-26 ENCOUNTER — Telehealth: Payer: Self-pay | Admitting: Neurology

## 2015-11-26 NOTE — Telephone Encounter (Signed)
PT said she was in the hospital and she had an MRI done and wants to know if Dr Tat can go over that with her on her next appointment/Dawn  CB#[phone]

## 2015-11-26 NOTE — Telephone Encounter (Signed)
Patient made aware that Dr. Carles Collet could discuss MR - but results are okay. She will keep follow up appt.

## 2015-11-27 DIAGNOSIS — R42 Dizziness and giddiness: Secondary | ICD-10-CM | POA: Diagnosis not present

## 2015-11-27 DIAGNOSIS — I959 Hypotension, unspecified: Secondary | ICD-10-CM | POA: Diagnosis not present

## 2015-11-27 DIAGNOSIS — I480 Paroxysmal atrial fibrillation: Secondary | ICD-10-CM | POA: Diagnosis not present

## 2015-11-27 DIAGNOSIS — I950 Idiopathic hypotension: Secondary | ICD-10-CM | POA: Diagnosis not present

## 2015-11-27 DIAGNOSIS — G20 Parkinson's disease: Secondary | ICD-10-CM | POA: Diagnosis not present

## 2015-11-29 DIAGNOSIS — I950 Idiopathic hypotension: Secondary | ICD-10-CM | POA: Diagnosis not present

## 2015-11-29 DIAGNOSIS — R42 Dizziness and giddiness: Secondary | ICD-10-CM | POA: Diagnosis not present

## 2015-12-04 NOTE — Progress Notes (Signed)
[name] was seen today in the movement disorders clinic for neurologic consultation at the request of [name].  Her PCP is Catherine November, MD.  She is accompanied by her friend who supplements the history.  Prior records made available to me were reviewed.  The patient apparently was diagnosed with Parkinson's disease by Dr. Leta [name] in 2011, but thinks she had symptoms long before that.  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  Dr. Gladstone Lighter records indicate that the patient was never very accepting of the diagnosis.  He did start her on levodopa but she only worked up to one tablet twice a day and ultimately ended up stopping the medication in February, 2014 after she didn't think that the medication was helpful.  She states "I didn't accept the diagnosis and I don't like to take medication."  She really has not had any neurology follow-up since that time.  She did have an appointment with me in September, 2015 but she canceled that.  02/22/15 update:  The patient is following up today as she will be having a levodopa challenge test.  She is not on any levodopa at home.  Last visit, I did schedule a modified barium swallow test.  This was done earlier today, and it was normal.  She was found to be mildly B12 deficient at our last visit (221) and I asked her to start a oral B12 supplement.  Physical therapy will start on [name] 16.  04/25/14 update:  The patient is following up today.  I have reviewed records from other physicians made available to me.  She has a history of vascular parkinsonism, with no benefit from levodopa.  She has been attending physical therapy for gait and balance since our last visit.  She completed her last session today and is  planning to start [facility] today.  She presents today primarily to talk about memory change.  Reports that sometimes she forgets to take her symbicort twice a day.  She has no problem driving and getting from one place to another.  She has no problems cooking/remembering to turn off stove.  She is having trouble balancing her checkbook and her daughter has been doing that.  She has an appt with the bank on Monday to help her straighten that out.  Her MRI of the brain does demonstrate relatively severe cerebral small vessel disease.  Since our last visit she has been treated for depression by her primary care physician.  She had been on sertraline for years but she did not think it was helping enough and Wellbutrin was initially added.  She felt that caused side effects and only wanted to be on one medication so the sertraline and Wellbutrin were discontinued and she was changed to Prozac.  She did not like that and ultimately went back to the sertraline.  She states that she is feeling much better now that she is back on sertraline and states that she was almost near suicidal when she was on the other medications.    12/05/15 update:  I have reviewed an extensive number of records since her last visit.  The patient saw Dr. Valentina Shaggy for testing in follow-up on April 12 and 08/08/2015.  There was no evidence of dementia.  Evidence only of mild cognitive impairment and generalized anxiety disorder.  Psychotherapy was recommended along with a possible driving assessment and repeat of the testing in 1-2 years.  The patient has been going to counseling at [facility].  She is not sure it is helping but has been to one session a month.   She do go to the emergency room on 11/22/2015 after being dizzy and taking meclizine the prior day.  That evening, she went to bed and woke up and still had some dizziness and ended up falling when she got up in the middle of the night.  She fell forward and hit her head on the  door (did not actually fall to the floor).  She presented to the emergency room because of this.  She had an MRI of the brain within the emergency room on 11/24/2015.  I reviewed this.  There was moderate to severe small vessel disease.  This had not changed from her MRI of the brain 2 years prior on 11/23/2013.  She wonders if this is the cause of dizziness and blood pressure.  She has pressure over the L head since her fall.  She finished PT about a month ago.  PREVIOUS MEDICATIONS: Sinemet  ALLERGIES:   Allergies  Allergen Reactions  . Adhesive [Tape] Other (See Comments)    Caused blisters  . Amoxicillin Other (See Comments)    Caused thrush  . Ciprofloxacin Other (See Comments)    Caused thrush  . Ultram [Tramadol] Other (See Comments)    hypotension    CURRENT MEDICATIONS:  Outpatient Encounter Prescriptions as of 12/05/2015  Medication Sig  . albuterol (VENTOLIN HFA) 108 (90 BASE) MCG/ACT inhaler Inhale 2 puffs into the lungs 2 (two) times daily as needed for wheezing or shortness of breath.  Marland Kitchen apixaban (ELIQUIS) 5 MG TABS tablet Take 5 mg by mouth 2 (two) times daily.  . Ascorbic Acid (VITAMIN C) 1000 MG tablet Take 1,000 mg by mouth daily.  Marland Kitchen azelastine (ASTELIN) 0.1 % nasal spray Place 2 sprays into both nostrils at bedtime as needed for rhinitis. Use in each nostril as directed  . Calcium Carb-Cholecalciferol (CALCIUM 1000 + D PO) Take 1,000 mg by mouth daily.  . meclizine (ANTIVERT) 25 MG tablet Take 1 or 2 po Q 6hrs for dizziness  . Propylene Glycol (SYSTANE BALANCE OP) Apply 2 drops to eye 3 (three) times daily as needed.  . sertraline (ZOLOFT) 100 MG tablet Take 1.5 tablets (150 mg total) by mouth daily. (Patient taking differently: Take 100 mg by mouth daily. )  . SYMBICORT 160-4.5 MCG/ACT inhaler INHALE 2 PUFFS BY MOUTH INTO THE LUNGS TWICE DAILY.  . vitamin B-12 (CYANOCOBALAMIN) 1000 MCG tablet Take 1,000 mcg by mouth daily.  Marland Kitchen diltiazem (CARDIZEM) 30 MG tablet Take 1  tablet (30 mg total) by mouth 4 (four) times daily. Take at onset on atrial fibrillation (Patient not taking: Reported on 12/05/2015)   No facility-administered encounter medications on file as of 12/05/2015.     PAST MEDICAL HISTORY:   Past Medical History:  Diagnosis Date  . Anxiety   . Arthritis   . Atrial fibrillation ([place])   . Cardiac arrhythmia due to congenital heart disease   . Chronic constipation   . COPD (chronic obstructive pulmonary disease) ([place])   . Depression   . GERD (gastroesophageal reflux disease)   . Hemorrhoid   . Hyperlipidemia   . Idiopathic hypotension   .  Lightheadedness 11/2015  . Mycotic toenails 10/27/2012  . Parkinson disease ([facility])   . Recurrent UTI   . 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 Varicose veins     PAST SURGICAL HISTORY:   Past Surgical History:  Procedure Laterality Date  . APPENDECTOMY  82 years old  . COLON RESECTION  2008  . vitriectomy  08-2015    SOCIAL HISTORY:   Social History   Social History  . Marital status: Widowed    Spouse name: N/A  . Number of children: 2  . Years of education: N/A   Occupational History  . retired- Environmental education officer , Water engineer    Social History Main Topics  . Smoking status: Former Smoker    Packs/day: 2.00    Years: 30.00    Types: Cigarettes    Quit date: 04/21/1980  . Smokeless tobacco: Never Used     Comment: onset age 42 -89, up to > 1ppd (almost 2 ppd)  . Alcohol use No  . Drug use: No  . Sexual activity: No   Other Topics Concern  . Not on file   Social History Narrative   Widowed, lives alone. 1 child in [place] and 1 in [place].   Previously worked as Product/process development scientist.    FAMILY HISTORY:   Family Status  Relation Status  . Brother Deceased   asthma  . Father Deceased   throat cancer  . Daughter Alive  . Daughter Alive  . Sister Deceased   breast cancer  . Mother Deceased at age 43   arthritis   . Maternal Grandfather   . Paternal Grandmother   . Sister   .  Neg Hx     ROS:  A complete 10 system review of systems was obtained and was unremarkable apart from what is mentioned above.  PHYSICAL EXAMINATION:    VITALS:   Vitals:   12/05/15 0826  BP: 100/60  Pulse: 72  Weight: 152 lb (68.9 kg)  Height: 5\' 10"  (1.778 m)    GEN:  The patient appears stated age and is in NAD. HEENT:  Normocephalic, atraumatic.  The mucous membranes are moist. The superficial temporal arteries are without ropiness or tenderness. CV:  RRR Lungs:  CTAB Neck/HEME:  There are no carotid bruits bilaterally.  Neurological examination:  Orientation: A&O x 3.   Montreal Cognitive Assessment  04/26/2015  Visuospatial/ Executive (0/5) 3  Naming (0/3) 3  Attention: Read list of digits (0/2) 2  Attention: Read list of letters (0/1) 1  Attention: Serial 7 subtraction starting at 100 (0/3) 1  Language: Repeat phrase (0/2) 2  Language : Fluency (0/1) 0  Abstraction (0/2) 2  Delayed Recall (0/5) 0  Orientation (0/6) 5  Total 19  Adjusted Score (based on education) 20    Cranial nerves: There is good facial symmetry.  There is no significant facial hypomimia.   The visual fields are full to confrontational testing. The speech is fluent and clear.  She has not particularly hypophonic.  She has no difficulty with guttural sounds.  Soft palate rises symmetrically and there is no tongue deviation. Hearing is intact to conversational tone. Sensation: Sensation is intact to light and pinprick throughout (facial, trunk, extremities). Vibration is markedly decreased in a distal fashion. There is no extinction with double simultaneous stimulation. There is no sensory dermatomal level identified. Motor: Strength is 5/5 in the bilateral upper and lower extremities.   Shoulder shrug is equal and symmetric.  There is  no pronator drift.   Movement examination: Tone: normal Abnormal movements: The patient does have a very minimal tremor at rest, L more than R Coordination:  There  is no significant decremation with RAM's, with any form of RAMS, including alternating supination and pronation of the forearm, hand opening and closing, finger taps, heel taps and toe taps. Gait and Station: The patient has no difficulty arising out of a deep-seated chair without the use of the hands. The patient's stride length is decreased but she doesn't shuffle.  The patient has a negative pull test.         Labs:  Lab Results  Component Value Date   VITAMINB12 221 02/12/2015   Lab Results  Component Value Date   HGBA1C 5.7 02/12/2015     ASSESSMENT/PLAN:  1.  Parkinsonism  -While the patient does have some evidence of parkinsonism, it is likely not idiopathic Parkinson's disease, but rather vascular parkinsonism.  An MRI of the brain from August, 2017 was reviewed today and there was a significant amount of cerebral small vessel disease.  I explained to her that this had not changed from her MRI exactly 2 years ago in August, 2015.  UPDRS motor on/off test on 02/22/15 showed only minimal improvement with levodopa and no improvement in walking, which is where she has most of her issues. Reassured her that MRI findings unrelated to dizziness and low blood pressure.  -We discussed safety in detail.  We discussed the importance of using an ambulatory assistive device, particularly if she is feeling dizzy/unstable.  Had walker today. Dizziness has resolved now.  She has meclizine for prn use but told not to drive when has it.  Has not even filled RX but told her to do so in case needs it.  -talked to her about drinking plenty of water and that could help dizziness/blood pressure.    -talked to her about the [employer] cycling programs and she states that [employer] recommended it as well and she asked me to fill out the form.  Did so today but told her to make sure okay with cardiologist.  -try to get back involved in [facility].  Info given and given on scholarship as well.  Met our Education officer, museum  today. 2.  Dysphagia  -MBE done on 02/22/15 was normal 3.  Gait instability  -I think that this is multifactorial.  While this can because by parkinsonism, she has evidence of very significant peripheral neuropathy.  She was mildly deficient in B12 and is now taking a supplement. 4.  Memory change  -Had neuropsych testing done by Dr. Valentina Shaggy in April, 2017 with evidence of generalized anxiety disorder and mild cognitive impairment.  Is going to [facility] for counseling and I would encourage her to continue this. 5.  She will follow-up as needed.  Much greater than 50% of this visit was spent in counseling and coordinating care.  Total face to face time:  35 min

## 2015-12-05 ENCOUNTER — Ambulatory Visit (INDEPENDENT_AMBULATORY_CARE_PROVIDER_SITE_OTHER): Payer: Medicare Other | Admitting: Neurology

## 2015-12-05 ENCOUNTER — Encounter: Payer: Self-pay | Admitting: Neurology

## 2015-12-05 VITALS — BP 100/60 | HR 72 | Ht 70.0 in | Wt 152.0 lb

## 2015-12-05 DIAGNOSIS — F411 Generalized anxiety disorder: Secondary | ICD-10-CM

## 2015-12-05 DIAGNOSIS — R42 Dizziness and giddiness: Secondary | ICD-10-CM

## 2015-12-05 DIAGNOSIS — G214 Vascular parkinsonism: Secondary | ICD-10-CM | POA: Diagnosis not present

## 2015-12-05 NOTE — Progress Notes (Signed)
Psychosocial/Social Work Assessment     Movement Disorders Clinic, [facility]  Patient: [name] MRN: [id number] Date of Assessment: 12-11-1927  PHYSICAL/MEDICAL SITUATION- As Verbally Reported by Patient and/or Carepartner   Current Movement Disorder Diagnosis?: Parkinsonism  Goal(s) for Attending Clinic:  Follow Up  Most Bothersome and/or Prominent Symptoms: Lack of Motivation     DEMOGRAPHIC INFORMATION  Race/Ethnicity: Caucasian  Age: 82 y.o.   Relationship Status    Widowed- husband passed 10 years ago      Living Situation     Alone   Pet(s) (if mentioned) - 2 cats       Employment Status     Retired      Designer, jewellery: Scientist, clinical (histocompatibility and immunogenetics), Elementary school [place] Alert&Oriented x 3  Comments:      AMBULATION   Answer the following as Yes (Y) or No (N)   balance issues with recent fall     AMBULATION ASSISTANCE   Answer the following as Yes (Y) or No (N)   Uses a cane   EXERCISE AND ACTIVITY ENGAGEMENT  Information about Current Exercise    Types of exercise: Walking      Past Rehabilitation or Therapy     Physical Therapy- past finished 1 month ago   Level of Activity and Engagement     Reported loss of engagement/interest     Current and Past Hobbies:  Enjoyed going to [employer] in the past for classes, has utilized [facility] in the past    MOOD, COPING, AND SUPPORT        Mood and Affect      Up and Down     Anxious  Apathetic (no interest)   Other: pt reports loss of interest in activities      Use of Mental Health Services: Yes, [place] [facility]  Keep stress to him/herself  Talk to friends  Attends support group?  (Yes (Y) or No (N)) No  Interested in information on local support groups? Yes-information provided  Family live nearby? No-2 children, 1 in [place] and 1 in [place]  Use in-home care? No  Support System    Fair  Other/Comments: Pt reports friends are supportive,  but feels she is a burden to friends.          SOCIAL WORK ASSISTANCE    Overall Impression:   CSW received referral from Dr. Carles Collet to meet with pt to provide psychosocial support during today's follow up visit. CSW met with pt in CSW office. CSW introduced self and explained role. Pt shared that she lives alone with her two cats. Pt discussed that her husband passed away ten years ago and she has two children that live out of state, one in [place] and one in [place]. Pt identifies her friends as her local support system.  CSW provided supportive counseling as pt discussed that in the past that she has been very actively involved in things and more recently she has lost interest in activities that she was passionate about in the past. Pt shared that she was involved at [facility] for 20 years attending their classes, but has not been recently. Pt shared that [facility] is starting a new program in October and CSW encouraged pt to consider re attending and pt expressed that she will attempt to. Pt shared that one thing that has changed is the amount of exercise she is participating in. Pt discussed that  she is hopeful to get back involved with [facility] and received the information for the [employer] cycling class today. Information given on scholarship to pt for [facility] gym.   Pt shared that she has been attending counseling once a month at [facility], but did not have a follow up appointment as she feels that she has too many doctor's appointments. CSW encouraged pt to continue counseling to be able to continue to process current feelings. CSW discussed local support group and pt feels support group may be beneficial. Pt reports that she goes to lunch or dinner with a group of friends frequently CSW discussed with pt about celebrating small victories each day.   Pt had questions surrounding her insurance plan coverage and CSW encouraged pt to contact insurance company regarding these questions.   Pt  shared that she is on the list to enter [facility] independent living. Pt reports that she cannot take her cats there and that is why she has not yet moved. Pt reports that her cats are older and in poor health and anticipates moving to [place] within the next year.   Pt inquired about transportation resources and CSW provided pt information on [employer] and [employer] transportation.   Pt provided with CSW contact information and encouraged to contact with any questions, concerns, or needs assistance with resources.   Education Materials Provided: transportation resources, PD exercise resources, support group resources provided.   Social Work Treatment Plan:  1. CSW to remain available to provide support and assistance to pt as needed. 2. CSW to connect pt with scholarship program that assists with [facility] gym. 3. [name] has been informed of social work services (including connection to community resources or Scientist, clinical (histocompatibility and immunogenetics) and short-term coping/problem-solving counseling) and has agreed to contact me if I can be of further assistance with any other social work services.  5. The information contained in this note has been reported to, and discussed with, Dr. Carles Collet.     Alison Murray, MSW, LCSW Clinical Social Worker Movement [facility] [phone]

## 2015-12-05 NOTE — Patient Instructions (Addendum)
1.  I encourage you to drink plenty of water, up to 60 oz of water a day 2.  I filled out your [employer] cycling forms 3.  Try to get back in contact with [facility] 4.  If you feel in a year or two that memory has changed or worsened, we can repeat that memory testing that Dr. Valentina Shaggy did in March.  Dr. Valentina Shaggy is retiring and we will do that in our office with Dr. Si Raider

## 2015-12-06 DIAGNOSIS — I951 Orthostatic hypotension: Secondary | ICD-10-CM | POA: Diagnosis not present

## 2015-12-06 DIAGNOSIS — I480 Paroxysmal atrial fibrillation: Secondary | ICD-10-CM | POA: Diagnosis not present

## 2015-12-06 DIAGNOSIS — G20 Parkinson's disease: Secondary | ICD-10-CM | POA: Diagnosis not present

## 2015-12-07 ENCOUNTER — Telehealth: Payer: Self-pay | Admitting: Neurology

## 2015-12-07 NOTE — Telephone Encounter (Signed)
Patient made aware we do not need to see her back since we are not prescribing any medications, if she needs [place] she can make a follow up appt in the future. If cardiologist needs advise from Dr. Carles Collet she will be happy to correspond with them. She will call with any other questions.

## 2015-12-07 NOTE — Telephone Encounter (Signed)
Left message on machine for patient to call back.

## 2015-12-07 NOTE — Telephone Encounter (Signed)
[name] 11/27/1927. She was seen by Dr. Carles Collet on 12/05/15. She was told she did not need another appointment scheduled for her parkinson's. She was seen by her Cardiologist the next day 12/06/15 who was wanting to work with Dr. Carles Collet about her BP issues and her parkinson's. The patient seems very confused. Her # is [phone]. Thank you

## 2015-12-07 NOTE — Telephone Encounter (Signed)
Please let pt know that I did talk with her cardiologist.  He did not realize I had tried levodopa previously in her and it didn't help (he thought that perhaps it would be useful).

## 2015-12-12 ENCOUNTER — Ambulatory Visit (INDEPENDENT_AMBULATORY_CARE_PROVIDER_SITE_OTHER): Payer: Medicare Other | Admitting: Internal Medicine

## 2015-12-12 ENCOUNTER — Encounter: Payer: Self-pay | Admitting: Internal Medicine

## 2015-12-12 VITALS — BP 82/58 | HR 91 | Temp 97.6°F | Resp 16 | Wt 153.6 lb

## 2015-12-12 DIAGNOSIS — I959 Hypotension, unspecified: Secondary | ICD-10-CM

## 2015-12-12 DIAGNOSIS — F418 Other specified anxiety disorders: Secondary | ICD-10-CM | POA: Diagnosis not present

## 2015-12-12 DIAGNOSIS — R42 Dizziness and giddiness: Secondary | ICD-10-CM | POA: Diagnosis not present

## 2015-12-12 DIAGNOSIS — F32A Depression, unspecified: Secondary | ICD-10-CM

## 2015-12-12 DIAGNOSIS — N390 Urinary tract infection, site not specified: Secondary | ICD-10-CM | POA: Diagnosis not present

## 2015-12-12 DIAGNOSIS — F329 Major depressive disorder, single episode, unspecified: Secondary | ICD-10-CM

## 2015-12-12 DIAGNOSIS — F419 Anxiety disorder, unspecified: Secondary | ICD-10-CM

## 2015-12-12 DIAGNOSIS — R399 Unspecified symptoms and signs involving the genitourinary system: Secondary | ICD-10-CM

## 2015-12-12 LAB — CBC WITH DIFFERENTIAL/PLATELET
HCT: 35.8 % — ABNORMAL LOW (ref 36.0–46.0)
Hemoglobin: 12 g/dL (ref 12.0–15.0)
MCHC: 33.5 g/dL (ref 30.0–36.0)
MCV: 91.5 fl (ref 78.0–100.0)
PLATELETS: 194 10*3/uL (ref 150.0–400.0)
RBC: 3.92 Mil/uL (ref 3.87–5.11)
RDW: 16 % — AB (ref 11.5–15.5)
WBC: 10.3 10*3/uL (ref 4.0–10.5)

## 2015-12-12 LAB — POCT URINALYSIS DIPSTICK
BILIRUBIN UA: NEGATIVE
GLUCOSE UA: NEGATIVE
NITRITE UA: NEGATIVE
Spec Grav, UA: 1.025
Urobilinogen, UA: 0.2
pH, UA: 5.5

## 2015-12-12 LAB — URINALYSIS, ROUTINE W REFLEX MICROSCOPIC
Bilirubin Urine: NEGATIVE
NITRITE: NEGATIVE
Specific Gravity, Urine: 1.02 (ref 1.000–1.030)
Total Protein, Urine: 100 — AB
URINE GLUCOSE: NEGATIVE
UROBILINOGEN UA: 0.2 (ref 0.0–1.0)
pH: 5.5 (ref 5.0–8.0)

## 2015-12-12 LAB — TSH: TSH: 4.44 u[IU]/mL (ref 0.35–4.50)

## 2015-12-12 MED ORDER — SULFAMETHOXAZOLE-TRIMETHOPRIM 800-160 MG PO TABS: 1.0000 | ORAL_TABLET | Freq: Two times a day (BID) | ORAL | 0 refills | Status: DC

## 2015-12-12 NOTE — Patient Instructions (Addendum)
GO TO THE LAB : Get the blood work     GO TO THE FRONT DESK Schedule a nurse visit 2 weeks from today for BP check  Schedule your next appointment for a  routine checkup in 4 months  Take the antibiotic as prescribed   Check the  blood pressure 2 or 3 times a week  Be sure your blood pressure is between 110/65 and  145/85. If it is consistently higher or lower, let me know

## 2015-12-12 NOTE — Progress Notes (Signed)
Pre visit review using our clinic review tool, if applicable. No additional management support is needed unless otherwise documented below in the visit note. 

## 2015-12-12 NOTE — Progress Notes (Signed)
Subjective:    Patient ID: [name], female    DOB: May 27, 1927, 82 y.o.   MRN: [id number]  DOS:  12/12/2015 Type of visit - description : Acute visit Interval history: here today is UTI, symptoms started yesterday.  Also, was seen at the hospital 11-22-2015 with dizziness, apparently she hit her head as well, had blurred vision, near syncope. CT head  negative, BMP, LFTs, CBC normal. Initial troponin slightly elevated but  they doubt acute coronary syndrome. Patient did not desire to be admitted  and left around 10 PM.  Subsequently was seen at the ER 11/24/2015, MRI of the brain showed no acute changes, repeated troponin negative and she was released home.  Was seen by  neurology 12-05-2015  For  Parkinson, safety issues were discussed in detail. Also was seen by her cardiologist 12/06/2015, echo was done. At this point, dizziness has improved significantly, no falls, no further head injuries.     Review of Systems + Dysuria, hematuria? Described the urine is dark yellow but not bloody per se. + Urinary frequency last night. No fever chills No nausea vomiting Right flank pain?. Appetite is good, reports she is eating and drinking plenty  Past Medical History:  Diagnosis Date  . Anxiety   . Arthritis   . Atrial fibrillation ([place])   . Cardiac arrhythmia due to congenital heart disease   . Chronic constipation   . COPD (chronic obstructive pulmonary disease) ([place])   . Depression   . GERD (gastroesophageal reflux disease)   . Hemorrhoid   . Hyperlipidemia   . Idiopathic hypotension   . Lightheadedness 11/2015  . Mycotic toenails 10/27/2012  . Parkinson disease ([name])   . Recurrent UTI   . 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 Varicose veins     Past Surgical History:  Procedure Laterality Date  . APPENDECTOMY  82 years old  . COLON RESECTION  2008  . vitriectomy  08-2015    Social History   Social History  . Marital status: Widowed    Spouse name: N/A    . Number of children: 2  . Years of education: N/A   Occupational History  . retired- Ambulance person , elementary school [employer]  . Smoking status: Former Smoker    Packs/day: 2.00    Years: 30.00    Types: Cigarettes    Quit date: 04/21/1980  . Smokeless tobacco: Never Used     Comment: onset age 35 -4, up to > 1ppd (almost 2 ppd)  . Alcohol use No  . Drug use: No  . Sexual activity: No   Other Topics Concern  . Not on file   Social History Narrative   Widowed, lives alone. 1 child in [place] and 1 in [place].   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        Medication List       Accurate as of 12/12/15  5:30 PM. Always use your most recent med list.          albuterol 108 (90 Base) MCG/ACT inhaler Commonly known as:  VENTOLIN HFA Inhale 2 puffs into the lungs 2 (two) times daily as needed for wheezing or shortness of breath.   apixaban 5 MG Tabs tablet Commonly known as:  ELIQUIS Take 5 mg by mouth 2 (two) times daily.   azelastine 0.1 % nasal spray Commonly known as:  ASTELIN Place 2 sprays into both nostrils at bedtime as needed for  rhinitis. Use in each nostril as directed   CALCIUM 1000 + D PO Take 1,000 mg by mouth daily.   diltiazem 30 MG tablet Commonly known as:  CARDIZEM Take 1 tablet (30 mg total) by mouth 4 (four) times daily. Take at onset on atrial fibrillation   meclizine 25 MG tablet Commonly known as:  ANTIVERT Take 1 or 2 po Q 6hrs for dizziness   sertraline 100 MG tablet Commonly known as:  ZOLOFT Take 100 mg by mouth daily.   sulfamethoxazole-trimethoprim 800-160 MG tablet Commonly known as:  BACTRIM DS Take 1 tablet by mouth 2 (two) times daily.   SYMBICORT 160-4.5 MCG/ACT inhaler Generic drug:  budesonide-formoterol INHALE 2 PUFFS BY MOUTH INTO THE LUNGS TWICE DAILY.   SYSTANE BALANCE OP Apply 2 drops to eye 3 (three) times daily as needed.   vitamin B-12 1000 MCG tablet Commonly known as:   CYANOCOBALAMIN Take 1,000 mcg by mouth daily.   vitamin C 1000 MG tablet Take 1,000 mg by mouth daily.          Objective:   Physical Exam BP (!) 82/58 (BP Location: Right Arm, Patient Position: Sitting, Cuff Size: Normal)   Pulse 91   Temp 97.6 F (36.4 C) (Oral)   Resp 16   Wt 153 lb 9.6 oz (69.7 kg)   SpO2 97%   BMI 22.04 kg/m  General:   Well developed, well nourished . NAD.  HEENT:  Normocephalic . Face symmetric, atraumatic Lungs:  CTA B Normal respiratory effort, no intercostal retractions, no accessory muscle use. Heart: Irregularly irregular.  no pretibial edema bilaterally . Extremities: + Varicose veins without phlebitis Abdomen:  Not distended, soft, non-tender. No rebound or rigidity. No CVA tenderness Skin: Not pale. Not jaundice Neurologic:  alert & oriented X3.  Speech normal, gait appropriate for age and unassisted Psych--  Cognition and judgment appear intact.  Cooperative with normal attention span and concentration.  Behavior appropriate. No anxious or depressed appearing.    Assessment & Plan:   Assessment   A1c 5.7 01-2015 Hyperlipidemia Anxiety depression  Dr Ferdinand Lango (psychology), meds rx by pcp COPD   P-Atrial fibrillation Dr Gerrit Friends, flecanaide prn /cardiazem prn GI: --GERD --Abnormal abdominal US mild pancreatic duct enlargement  --IBS used to be constipated, now diarrhea predominant --dysphagia MBE 02-22-15 wnl DJD--- take Celebrex very seldom Recurrent UTIs -- qd abx , has seenn urology Dr Edwena Blow before NEURO: ---Parkinsonism ---  Dr Tat ---Gait d/o (multifactorial likely) ---Peripheral neuropathy (pr neuro note 04-2015) ---mild cognitive impairmentairment (see neuro note 04-2015), dx confirmed by neuropsychiatry evaluation 07-2015 ---MBE 02/22/2015 normal admitted  01-2015: COPD and RVR   PLAN   UTI: udip + leukocytes and blood, send a UA, urine culture, start Bactrim for 5 days. Dizziness: Recently seen at the ER  twice, MRI of the brain was not acute, sx resolved. Low blood pressure: Pt reports low blood pressure lately, review of  the chart is confirmatory, does not take CCB daily. I checked the BPs myself today: Left arm 90/60, right arm 95/70. At this moment she feels well and denies any dizziness. Her low BP is asx, to be sure will check a CBC to r/o anemia  and a TSH. Continue drinking plenty of fluids. Nurse visit in 2 weeks for BP check. Anxiety depression: Currently taking sertraline 100 mg only.med list updated  RTC: 2 weeks nurse visit, 4 months with me.

## 2015-12-12 NOTE — Assessment & Plan Note (Signed)
UTI: udip + leukocytes and blood, send a UA, urine culture, start Bactrim for 5 days. Dizziness: Recently seen at the ER twice, MRI of the brain was not acute, sx resolved. Low blood pressure: Pt reports low blood pressure lately, review of  the chart is confirmatory, does not take CCB daily. I checked the BPs myself today: Left arm 90/60, right arm 95/70. At this moment she feels well and denies any dizziness. Her low BP is asx, to be sure will check a CBC to r/o anemia  and a TSH. Continue drinking plenty of fluids. Nurse visit in 2 weeks for BP check. Anxiety depression: Currently taking sertraline 100 mg only.med list updated  RTC: 2 weeks nurse visit, 4 months with me.

## 2015-12-14 LAB — URINE CULTURE

## 2015-12-18 ENCOUNTER — Ambulatory Visit (INDEPENDENT_AMBULATORY_CARE_PROVIDER_SITE_OTHER): Payer: Medicare Other | Admitting: Psychiatry

## 2015-12-18 DIAGNOSIS — F411 Generalized anxiety disorder: Secondary | ICD-10-CM

## 2015-12-19 DIAGNOSIS — N6459 Other signs and symptoms in breast: Secondary | ICD-10-CM | POA: Diagnosis not present

## 2016-01-02 ENCOUNTER — Ambulatory Visit (INDEPENDENT_AMBULATORY_CARE_PROVIDER_SITE_OTHER): Payer: Medicare Other | Admitting: Podiatry

## 2016-01-02 ENCOUNTER — Encounter: Payer: Self-pay | Admitting: Podiatry

## 2016-01-02 VITALS — BP 109/66 | HR 99 | Resp 14 | Ht 70.0 in | Wt 153.0 lb

## 2016-01-02 DIAGNOSIS — M79676 Pain in unspecified toe(s): Secondary | ICD-10-CM

## 2016-01-02 DIAGNOSIS — B351 Tinea unguium: Secondary | ICD-10-CM

## 2016-01-02 NOTE — Progress Notes (Signed)
Patient ID: [name], female   DOB: [birth year], 82 y.o.   MRN: [id number] Complaint:  Visit Type: Patient returns to my office for continued preventative foot care services. Complaint: Patient states" my nails have grown long and thick and become painful to walk and wear shoes" . She presents for preventative foot care services. No changes to ROS.  She says she has been hospitalized with atrial fib and asthma.  Podiatric Exam: Vascular: dorsalis pedis and posterior tibial pulses are palpable bilateral. Capillary return is immediate. Temperature gradient is WNL. Skin turgor WNL  Sensorium: Normal Semmes Weinstein monofilament test. Normal tactile sensation bilaterally. Nail Exam: Pt has thick disfigured discolored nails with subungual debris noted bilateral entire nail hallux through fifth toenails Ulcer Exam: There is no evidence of ulcer or pre-ulcerative changes or infection. Orthopedic Exam: Muscle tone and strength are WNL. No limitations in general ROM. No crepitus or effusions noted. Foot type and digits show no abnormalities. Bony prominences are unremarkable. Skin: No Porokeratosis. No infection or ulcers  Diagnosis:  Tinea unguium, Pain in right toe, pain in left toes  Treatment & Plan Procedures and Treatment: Consent by patient was obtained for treatment procedures. The patient understood the discussion of treatment and procedures well. All questions were answered thoroughly reviewed. Debridement of mycotic and hypertrophic toenails, 1 through 5 bilateral and clearing of subungual debris. No ulceration, no infection noted.  Return Visit-Office Procedure: Patient instructed to return to the office for a follow up visit 3 months for continued evaluation and treatment.   Gardiner Barefoot DPM

## 2016-01-05 NOTE — Discharge Summary (Signed)
82 year old female with history of anxiety, A. fib on elaquist, COPD, Parkinson's, HLD, presenting today with dizziness and fall Pt hit her head on door jamb last night.. Patient reports that at 4 PM yesterday she did start to experience dizziness. She says that she tried to eat a hamburger at [employer] as well as drink plenty of fluids and salt which usually makes it better. Nothing however made it better. Patient got up in the middle the night and felt incredibly dizzy and pitched forward and fell down and hit her head on the left-hand side. She reports that since then she has had blurry vision to the left eye (on exam its on the right side). Patient recently had surgery for macular puckering on the left eye. Pt continues to have headache, blurry vision. Pt concerned about head bleed  as she is on Eliquis   She said that she took 2 meclizine today for the dizziness (indicating that she has a history of dizziness). Patient was ambulated in the ER without difficulty ,The blurriness of vision is actually in her right eye which is not the eye that her surgery was done on.   Patient transferred to [facility] for evaluation of possible CVA, need for an MRI, abnormal troponin  Patient is very concerned about staying D/T the fact that she is still paying a bill for her last Observation stay in October 2016 because Medicare and her secondary insurance denied payment. Case Management called and will be up to see her shortly and later in the evening she signed out [facility]

## 2016-01-09 DIAGNOSIS — H348322 Tributary (branch) retinal vein occlusion, left eye, stable: Secondary | ICD-10-CM | POA: Diagnosis not present

## 2016-01-09 DIAGNOSIS — H35371 Puckering of macula, right eye: Secondary | ICD-10-CM | POA: Diagnosis not present

## 2016-01-24 DIAGNOSIS — Z23 Encounter for immunization: Secondary | ICD-10-CM | POA: Diagnosis not present

## 2016-01-31 ENCOUNTER — Encounter: Payer: Self-pay | Admitting: Internal Medicine

## 2016-01-31 ENCOUNTER — Ambulatory Visit (INDEPENDENT_AMBULATORY_CARE_PROVIDER_SITE_OTHER): Payer: Medicare Other | Admitting: Internal Medicine

## 2016-01-31 VITALS — BP 100/68 | HR 66 | Temp 97.6°F | Wt 152.6 lb

## 2016-01-31 DIAGNOSIS — R399 Unspecified symptoms and signs involving the genitourinary system: Secondary | ICD-10-CM

## 2016-01-31 DIAGNOSIS — R946 Abnormal results of thyroid function studies: Secondary | ICD-10-CM | POA: Diagnosis not present

## 2016-01-31 DIAGNOSIS — R7989 Other specified abnormal findings of blood chemistry: Secondary | ICD-10-CM

## 2016-01-31 DIAGNOSIS — R110 Nausea: Secondary | ICD-10-CM | POA: Diagnosis not present

## 2016-01-31 DIAGNOSIS — N390 Urinary tract infection, site not specified: Secondary | ICD-10-CM

## 2016-01-31 LAB — URINALYSIS, ROUTINE W REFLEX MICROSCOPIC
Bilirubin Urine: NEGATIVE
Hgb urine dipstick: NEGATIVE
KETONES UR: NEGATIVE
Nitrite: NEGATIVE
PH: 5.5 (ref 5.0–8.0)
TOTAL PROTEIN, URINE-UPE24: NEGATIVE
Urine Glucose: NEGATIVE
Urobilinogen, UA: 0.2 (ref 0.0–1.0)

## 2016-01-31 LAB — CBC WITH DIFFERENTIAL/PLATELET
BASOS PCT: 0.5 % (ref 0.0–3.0)
Basophils Absolute: 0 10*3/uL (ref 0.0–0.1)
EOS ABS: 0.1 10*3/uL (ref 0.0–0.7)
EOS PCT: 1.4 % (ref 0.0–5.0)
HCT: 36.6 % (ref 36.0–46.0)
HEMOGLOBIN: 12.2 g/dL (ref 12.0–15.0)
LYMPHS ABS: 1.7 10*3/uL (ref 0.7–4.0)
Lymphocytes Relative: 23.5 % (ref 12.0–46.0)
MCHC: 33.3 g/dL (ref 30.0–36.0)
MCV: 92.6 fl (ref 78.0–100.0)
MONO ABS: 0.6 10*3/uL (ref 0.1–1.0)
Monocytes Relative: 8.8 % (ref 3.0–12.0)
NEUTROS ABS: 4.7 10*3/uL (ref 1.4–7.7)
Neutrophils Relative %: 65.8 % (ref 43.0–77.0)
PLATELETS: 219 10*3/uL (ref 150.0–400.0)
RBC: 3.95 Mil/uL (ref 3.87–5.11)
RDW: 14.7 % (ref 11.5–15.5)
WBC: 7.1 10*3/uL (ref 4.0–10.5)

## 2016-01-31 LAB — POC URINALSYSI DIPSTICK (AUTOMATED)
BILIRUBIN UA: NEGATIVE
GLUCOSE UA: NEGATIVE
KETONES UA: NEGATIVE
Nitrite, UA: NEGATIVE
Protein, UA: NEGATIVE
RBC UA: NEGATIVE
SPEC GRAV UA: 1.015
Urobilinogen, UA: 2
pH, UA: 6

## 2016-01-31 LAB — TSH: TSH: 3.5 u[IU]/mL (ref 0.35–4.50)

## 2016-01-31 MED ORDER — TRIMETHOPRIM 100 MG PO TABS: 100.0000 mg | ORAL_TABLET | Freq: Every day | ORAL | 6 refills | Status: DC

## 2016-01-31 NOTE — Assessment & Plan Note (Signed)
Nausea, fatigue: Not feeling well for 4 days, no alarming symptoms. BP is low but at baseline.Exam she seems at baseline, no distress. Will get a CBC to rule out anemia, UA and urine culture (symptomatic UTI?) Encourage rest, push fluids, call if not gradually improving. Recurrent UTIs: Udipe trace leukocytes, will put her back on daily trimethoprim. Slightly elevated TSH: Recheck today. See  instructions

## 2016-01-31 NOTE — Progress Notes (Signed)
Pre visit review using our clinic review tool, if applicable. No additional management support is needed unless otherwise documented below in the visit note. 

## 2016-01-31 NOTE — Progress Notes (Signed)
Subjective:    Patient ID: [name], female    DOB: 1928-03-11, 82 y.o.   MRN: [id number]  DOS:  01/31/2016 Type of visit - description : acute Interval history: Not feeling well for the last 4 days, reports nausea and feeling more sleepy and tired than usual. Denies any vomiting, appetite is decreased but she is forcing herself to take fluids. Ambulatory BPs range from 117-97/70-80. Not far from baseline. Med list reviewed, she's not taking daily antibiotics to prevent UTIs.   BP Readings from Last 3 Encounters:  01/31/16 100/68  01/02/16 109/66  12/12/15 (!) 82/58    Review of Systems No fever, chills?Marland Kitchen No abdominal pain, "just a little gassy as usual". No blood in the stools. Bowel movements are regular, used to have a lot of diarrhea but here lately stools are better. Denies dysuria or gross hematuria She has headaches sometimes, not unusual for her.   Past Medical History:  Diagnosis Date  . Anxiety   . Arthritis   . Atrial fibrillation ([place])   . Cardiac arrhythmia due to congenital heart disease   . Chronic constipation   . COPD (chronic obstructive pulmonary disease) ([place])   . Depression   . GERD (gastroesophageal reflux disease)   . Hemorrhoid   . Hyperlipidemia   . Idiopathic hypotension   . Lightheadedness 11/2015  . Mycotic toenails 10/27/2012  . Parkinson disease ([place])   . Recurrent UTI   . 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 Varicose veins     Past Surgical History:  Procedure Laterality Date  . APPENDECTOMY  82 years old  . COLON RESECTION  2008  . vitriectomy  08-2015    Social History   Social History  . Marital status: Widowed    Spouse name: N/A  . Number of children: 2  . Years of education: N/A   Occupational History  . retired- Ambulance person , elementary school [employer]  . Smoking status: Former Smoker    Packs/day: 2.00    Years: 30.00    Types: Cigarettes    Quit date: 04/21/1980  .  Smokeless tobacco: Never Used     Comment: onset age 44 -4, up to > 1ppd (almost 2 ppd)  . Alcohol use No  . Drug use: No  . Sexual activity: No   Other Topics Concern  . Not on file   Social History Narrative   Widowed, lives alone. 1 child in [place] and 1 in [place].   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        Medication List       Accurate as of 01/31/16  4:48 PM. Always use your most recent med list.          albuterol 108 (90 Base) MCG/ACT inhaler Commonly known as:  VENTOLIN HFA Inhale 2 puffs into the lungs 2 (two) times daily as needed for wheezing or shortness of breath.   apixaban 5 MG Tabs tablet Commonly known as:  ELIQUIS Take 5 mg by mouth 2 (two) times daily.   azelastine 0.1 % nasal spray Commonly known as:  ASTELIN Place 2 sprays into both nostrils at bedtime as needed for rhinitis. Use in each nostril as directed   CALCIUM 1000 + D PO Take 1,000 mg by mouth daily.   diltiazem 30 MG tablet Commonly known as:  CARDIZEM Take 1 tablet (30 mg total) by mouth 4 (four) times daily. Take at onset on atrial  fibrillation   fludrocortisone 0.1 MG tablet Commonly known as:  FLORINEF Take 0.1 mg by mouth daily.   meclizine 25 MG tablet Commonly known as:  ANTIVERT Take 1 or 2 po Q 6hrs for dizziness   sertraline 100 MG tablet Commonly known as:  ZOLOFT Take 100 mg by mouth daily.   SYMBICORT 160-4.5 MCG/ACT inhaler Generic drug:  budesonide-formoterol INHALE 2 PUFFS BY MOUTH INTO THE LUNGS TWICE DAILY.   SYSTANE BALANCE OP Apply 2 drops to eye 3 (three) times daily as needed.   trimethoprim 100 MG tablet Commonly known as:  TRIMPEX Take 1 tablet (100 mg total) by mouth daily. Reported on 10/03/2015   vitamin B-12 1000 MCG tablet Commonly known as:  CYANOCOBALAMIN Take 1,000 mcg by mouth daily.   vitamin C 1000 MG tablet Take 1,000 mg by mouth daily.          Objective:   Physical Exam BP 100/68 (BP Location: Left Arm, Patient Position:  Sitting, Cuff Size: Normal)   Pulse 66   Temp 97.6 F (36.4 C) (Oral)   Wt 152 lb 9.6 oz (69.2 kg)   SpO2 97%   BMI 21.90 kg/m  General:   Well developed, well nourished . NAD.  HEENT:  Normocephalic . Face symmetric, atraumatic Lungs:  CTA B Normal respiratory effort, no intercostal retractions, no accessory muscle use. Heart: Irregularly irregular,  no murmur.  no pretibial edema bilaterally  Abdomen:  Not distended, soft, non-tender. No rebound or rigidity.  Skin: Not pale. Not jaundice Neurologic:  alert & oriented X3.  Speech normal, gait appropriate for age and unassisted Psych--  Cognition and judgment appear intact.  Cooperative with normal attention span and concentration.  Behavior appropriate. No anxious or depressed appearing.    Assessment & Plan:   Assessment   A1c 5.7 01-2015 Hyperlipidemia Anxiety depression  Dr Ferdinand Lango (psychology), meds rx by pcp COPD   CV: P-Atrial fibrillation Dr Gerrit Friends, flecanaide prn /cardiazem prn. Echo 11-2015 @ cards Low BP- likely d/t parkinson, rx fludrocortisone ~ 11-2015  GI: --GERD --Abnormal abdominal US mild pancreatic duct enlargement  --IBS used to be constipated, now diarrhea predominant --dysphagia MBE 02-22-15 wnl DJD--- take Celebrex very seldom Recurrent UTIs -- qd abx , has seen urology Dr Edwena Blow before NEURO: ---Parkinsonism ---  Dr Tat ---Gait d/o (multifactorial likely) ---Peripheral neuropathy (pr neuro note 04-2015) ---mild cognitive impairmentairment (see neuro note 04-2015), dx confirmed by neuropsychiatry evaluation 07-2015 ---MBE 02/22/2015 normal admitted  01-2015: COPD and RVR   PLAN   Nausea, fatigue: Not feeling well for 4 days, no alarming symptoms. BP is low but at baseline.Exam she seems at baseline, no distress. Will get a CBC to rule out anemia, UA and urine culture (symptomatic UTI?) Encourage rest, push fluids, call if not gradually improving. Recurrent UTIs: Udipe trace leukocytes,  will put her back on daily trimethoprim. Slightly elevated TSH: Recheck today. See  instructions

## 2016-01-31 NOTE — Patient Instructions (Addendum)
GO TO THE LAB : Get the blood work , we also need a larger urine sample.  Rest, drink plenty of fluids, go back on your daily antibiotic trimethoprim. Prescription was sent  Call if you are not going back to normal within few days  Call anytime if you have fever, chills, severe nausea, stomach pain, blood in the stools .

## 2016-02-01 LAB — URINE CULTURE: Organism ID, Bacteria: NO GROWTH

## 2016-02-07 ENCOUNTER — Ambulatory Visit (INDEPENDENT_AMBULATORY_CARE_PROVIDER_SITE_OTHER): Payer: Medicare Other | Admitting: Psychiatry

## 2016-02-07 DIAGNOSIS — F411 Generalized anxiety disorder: Secondary | ICD-10-CM | POA: Diagnosis not present

## 2016-02-11 ENCOUNTER — Telehealth: Payer: Self-pay | Admitting: Internal Medicine

## 2016-02-11 NOTE — Telephone Encounter (Signed)
Per PCP, he recommends an appt to discuss (30 mins). Please schedule at Pt's convenience. Thank you.

## 2016-02-11 NOTE — Telephone Encounter (Signed)
Please advise 

## 2016-02-11 NOTE — Telephone Encounter (Signed)
Has a long history of anxiety, chart is reviewed, I don't think she has ever been completely well-controlled. Used to see psychiatry. Advise patient: We could refer her to psychiatry ; otherwise schedule a visit to discuss options. We could add Wellbutrin or try a different SSRI. ER if suicidal thoughts.

## 2016-02-11 NOTE — Telephone Encounter (Signed)
°  Relation to [id number]:QLRJ Call back [place]: [facility], [address]  Reason for call:  Patient states clinical psycologist advised  sertraline (ZOLOFT) 100 MG tablet is not working and to speak with PCP regarding  alpha genomics, please advise

## 2016-02-11 NOTE — Telephone Encounter (Signed)
Patient scheduled for 02/19/2016 at 2pm.

## 2016-02-19 ENCOUNTER — Encounter: Payer: Self-pay | Admitting: Internal Medicine

## 2016-02-19 ENCOUNTER — Ambulatory Visit: Payer: Medicare Other | Admitting: Psychiatry

## 2016-02-19 ENCOUNTER — Ambulatory Visit (INDEPENDENT_AMBULATORY_CARE_PROVIDER_SITE_OTHER): Payer: Medicare Other | Admitting: Internal Medicine

## 2016-02-19 VITALS — BP 108/74 | HR 104 | Temp 97.4°F | Resp 14 | Ht 70.0 in | Wt 149.5 lb

## 2016-02-19 DIAGNOSIS — F419 Anxiety disorder, unspecified: Principal | ICD-10-CM

## 2016-02-19 DIAGNOSIS — F418 Other specified anxiety disorders: Secondary | ICD-10-CM

## 2016-02-19 DIAGNOSIS — F329 Major depressive disorder, single episode, unspecified: Secondary | ICD-10-CM

## 2016-02-19 MED ORDER — DULOXETINE HCL 30 MG PO CPEP: 30.0000 mg | ORAL_CAPSULE | Freq: Every day | ORAL | 1 refills | Status: DC

## 2016-02-19 MED FILL — DULoxetine HCL 30 MG CPEP: 30 | 30 days supply | Qty: 30 | Fill #0

## 2016-02-19 NOTE — Progress Notes (Signed)
Subjective:    Patient ID: [name], female    DOB: 06/27/1927, 82 y.o.   MRN: [id number]  DOS:  02/19/2016 Type of visit - description : To discuss depression Interval history: Long  history of depression, not satisfied with the current treatment. Symptoms are ongoing, not severe but not better. Wonders about alpha genomix  Review of Systems  denies any suicidal ideas. She is more active, doing yoga, that has help in general.  Past Medical History:  Diagnosis Date  . Anxiety   . Arthritis   . Atrial fibrillation ([place])   . Cardiac arrhythmia due to congenital heart disease   . Chronic constipation   . COPD (chronic obstructive pulmonary disease) ([place])   . Depression   . GERD (gastroesophageal reflux disease)   . Hemorrhoid   . Hyperlipidemia   . Idiopathic hypotension   . Lightheadedness 11/2015  . Mycotic toenails 10/27/2012  . Parkinson disease ([place])   . Recurrent UTI   . 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 Varicose veins     Past Surgical History:  Procedure Laterality Date  . APPENDECTOMY  82 years old  . COLON RESECTION  2008  . vitriectomy  08-2015    Social History   Social History  . Marital status: Widowed    Spouse name: N/A  . Number of children: 2  . Years of education: N/A   Occupational History  . retired- Ambulance person , elementary school [employer]  . Smoking status: Former Smoker    Packs/day: 2.00    Years: 30.00    Types: Cigarettes    Quit date: 04/21/1980  . Smokeless tobacco: Never Used     Comment: onset age 67 -43, up to > 1ppd (almost 2 ppd)  . Alcohol use No  . Drug use: No  . Sexual activity: No   Other Topics Concern  . Not on file   Social History Narrative   Widowed, lives alone. 1 child in [place] and 1 in [place].   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        Medication List       Accurate as of 02/19/16 11:59 PM. Always use your most recent med list.          albuterol 108  (90 Base) MCG/ACT inhaler Commonly known as:  VENTOLIN HFA Inhale 2 puffs into the lungs 2 (two) times daily as needed for wheezing or shortness of breath.   apixaban 5 MG Tabs tablet Commonly known as:  ELIQUIS Take 5 mg by mouth 2 (two) times daily.   azelastine 0.1 % nasal spray Commonly known as:  ASTELIN Place 2 sprays into both nostrils at bedtime as needed for rhinitis. Use in each nostril as directed   CALCIUM 1000 + D PO Take 1,000 mg by mouth daily.   diltiazem 30 MG tablet Commonly known as:  CARDIZEM Take 1 tablet (30 mg total) by mouth 4 (four) times daily. Take at onset on atrial fibrillation   DULoxetine 30 MG capsule Commonly known as:  CYMBALTA Take 1 capsule (30 mg total) by mouth daily.   fludrocortisone 0.1 MG tablet Commonly known as:  FLORINEF Take 0.1 mg by mouth daily.   meclizine 25 MG tablet Commonly known as:  ANTIVERT Take 1 or 2 po Q 6hrs for dizziness   sertraline 100 MG tablet Commonly known as:  ZOLOFT Take 100 mg by mouth daily.   SYMBICORT 160-4.5 MCG/ACT  inhaler Generic drug:  budesonide-formoterol INHALE 2 PUFFS BY MOUTH INTO THE LUNGS TWICE DAILY.   SYSTANE BALANCE OP Apply 2 drops to eye 3 (three) times daily as needed.   trimethoprim 100 MG tablet Commonly known as:  TRIMPEX Take 1 tablet (100 mg total) by mouth daily. Reported on 10/03/2015   vitamin B-12 1000 MCG tablet Commonly known as:  CYANOCOBALAMIN Take 1,000 mcg by mouth daily.   vitamin C 1000 MG tablet Take 1,000 mg by mouth daily.          Objective:   Physical Exam BP 108/74 (BP Location: Left Arm, Patient Position: Sitting, Cuff Size: Small)   Pulse (!) 104   Temp 97.4 F (36.3 C) (Oral)   Resp 14   Ht '5\' 10"'$  (1.778 m)   Wt 149 lb 8 oz (67.8 kg)   SpO2 95%   BMI 21.45 kg/m  General:   Well developed, well nourished . NAD.  HEENT:  Normocephalic . Face symmetric, atraumatic Skin: Not pale. Not jaundice Neurologic:  alert & oriented X3.    Speech normal, gait appropriate for age and unassisted Psych--  Cognition and judgment appear intact.  Cooperative with normal attention span and concentration.  Behavior appropriate. No anxious or depressed appearing.      Assessment & Plan:    Assessment   A1c 5.7 01-2015 Hyperlipidemia Anxiety depression  Dr Ferdinand Lango (psychology), meds per pcp (see OV note from 02-19-16) Sx onset ~ 2007:  2012, 2013 was on citalopram. Then sertraline. Unable to tolerate Prozac on Wellbutrin 02-2015 (tired, dizzy). Trial with Cymbalta 01-2016. COPD   CV: P-Atrial fibrillation Dr Gerrit Friends, flecanaide prn /cardiazem prn. Echo 11-2015 @ [facility] Low BP- likely d/t parkinson, rx fludrocortisone ~ 11-2015  GI: --GERD --Abnormal abdominal US mild pancreatic duct enlargement  --IBS used to be constipated, now diarrhea predominant --dysphagia MBE 02-22-15 wnl DJD--- take Celebrex very seldom Recurrent UTIs -- qd abx , has seen urology Dr Edwena Blow before NEURO: ---Parkinsonism ---  Dr Tat ---Gait d/o (multifactorial likely) ---Peripheral neuropathy (pr neuro note 04-2015) ---mild cognitive impairmentairment (see neuro note 04-2015), dx confirmed by neuropsychiatry evaluation 07-2015 ---MBE 02/22/2015 normal admitted  01-2015: COPD and RVR   PLAN   Anxiety and depression: Sx started approximately 2007, was on citalopram 2012, 2013, subsequently changed to sertraline. Was never completely satisfied with the treatment. In 2016 we tried Prozac and Wellbutrin but they cause fatigue and dizziness so she went back on sertraline. She is wonder about alpha genomix, she did some research and find out that is extremely expensive. Her daughter takes Lexapro with good results. We discussed different options such as Lexapro but in her particular case, she already tried citalopram which is very similar. We agreed on try Cymbalta 30 mg daily. Will taper down sertraline. See instruction. Will RTC in 5-6 weeks, call if  problems. Marland Kitchen

## 2016-02-19 NOTE — Progress Notes (Signed)
Pre visit review using our clinic review tool, if applicable. No additional management support is needed unless otherwise documented below in the visit note. 

## 2016-02-19 NOTE — Patient Instructions (Signed)
Start Cymbalta 30 mg: 1 tablet every day  Decrease sertraline to 1/2 tablet daily  After 2 weeks, you stop sertraline and continue with Cymbalta.  Next visit with me  5-6 weeks, call if problems.

## 2016-02-21 NOTE — Assessment & Plan Note (Signed)
Anxiety and depression: Sx started approximately 2007, was on citalopram 2012, 2013, subsequently changed to sertraline. Was never completely satisfied with the treatment. In 2016 we tried Prozac and Wellbutrin but they cause fatigue and dizziness so she went back on sertraline. She is wonder about alpha genomix, she did some research and find out that is extremely expensive. Her daughter takes Lexapro with good results. We discussed different options such as Lexapro but in her particular case, she already tried citalopram which is very similar. We agreed on try Cymbalta 30 mg daily. Will taper down sertraline. See instruction. Will RTC in 5-6 weeks, call if problems.

## 2016-03-05 DIAGNOSIS — I480 Paroxysmal atrial fibrillation: Secondary | ICD-10-CM | POA: Diagnosis not present

## 2016-03-05 DIAGNOSIS — I951 Orthostatic hypotension: Secondary | ICD-10-CM | POA: Diagnosis not present

## 2016-03-05 DIAGNOSIS — G20 Parkinson's disease: Secondary | ICD-10-CM | POA: Diagnosis not present

## 2016-03-06 ENCOUNTER — Ambulatory Visit (INDEPENDENT_AMBULATORY_CARE_PROVIDER_SITE_OTHER): Payer: Medicare Other | Admitting: Psychiatry

## 2016-03-06 DIAGNOSIS — F411 Generalized anxiety disorder: Secondary | ICD-10-CM | POA: Diagnosis not present

## 2016-03-14 MED FILL — DULoxetine HCL 30 MG CPEP: 30 | 30 days supply | Qty: 30 | Fill #1

## 2016-03-31 ENCOUNTER — Ambulatory Visit (INDEPENDENT_AMBULATORY_CARE_PROVIDER_SITE_OTHER): Payer: Medicare Other | Admitting: Internal Medicine

## 2016-03-31 ENCOUNTER — Encounter: Payer: Self-pay | Admitting: Internal Medicine

## 2016-03-31 ENCOUNTER — Ambulatory Visit (HOSPITAL_BASED_OUTPATIENT_CLINIC_OR_DEPARTMENT_OTHER)
Admission: RE | Admit: 2016-03-31 | Discharge: 2016-03-31 | Disposition: A | Discharge status: Home / Self Care | Payer: Medicare Other | Source: Ambulatory Visit | Attending: Internal Medicine | Admitting: Internal Medicine

## 2016-03-31 VITALS — BP 124/68 | HR 100 | Temp 98.1°F | Resp 14 | Ht 70.0 in | Wt 150.0 lb

## 2016-03-31 DIAGNOSIS — F418 Other specified anxiety disorders: Secondary | ICD-10-CM | POA: Diagnosis not present

## 2016-03-31 DIAGNOSIS — R399 Unspecified symptoms and signs involving the genitourinary system: Secondary | ICD-10-CM

## 2016-03-31 DIAGNOSIS — R634 Abnormal weight loss: Secondary | ICD-10-CM | POA: Insufficient documentation

## 2016-03-31 DIAGNOSIS — F419 Anxiety disorder, unspecified: Secondary | ICD-10-CM

## 2016-03-31 DIAGNOSIS — N390 Urinary tract infection, site not specified: Secondary | ICD-10-CM | POA: Diagnosis not present

## 2016-03-31 DIAGNOSIS — F329 Major depressive disorder, single episode, unspecified: Secondary | ICD-10-CM

## 2016-03-31 DIAGNOSIS — R627 Adult failure to thrive: Secondary | ICD-10-CM

## 2016-03-31 DIAGNOSIS — F32A Depression, unspecified: Secondary | ICD-10-CM

## 2016-03-31 LAB — URINALYSIS, ROUTINE W REFLEX MICROSCOPIC
Bilirubin Urine: NEGATIVE
LEUKOCYTES UA: NEGATIVE
Nitrite: NEGATIVE
SPECIFIC GRAVITY, URINE: 1.015 (ref 1.000–1.030)
TOTAL PROTEIN, URINE-UPE24: NEGATIVE
URINE GLUCOSE: NEGATIVE
UROBILINOGEN UA: 0.2 (ref 0.0–1.0)
pH: 6 (ref 5.0–8.0)

## 2016-03-31 NOTE — Progress Notes (Signed)
Subjective:    Patient ID: [name], female    DOB: 1927/04/22, 82 y.o.   MRN: [id number]  DOS:  03/31/2016 Type of visit - description : Follow up. Multiple concerns Interval history: Anxiety depression: On Cymbalta, feeling very well, better than previous years. Denies side effects or suicidal ideas.  Patient report weight loss, she has documented 9 pounds in about a year. Unintentional. She is having some difficulty with mobility but states that she is able to get food without problems.  Failure to thrive: Less able to do her ADLs, home health agency?.  Concern about the left varicose veins. No pain, swelling or ulcers.  History chronic UTIs, few days ago noted a strong smell in the urine, fever?Marland Kitchen Also some back pain. Started antibiotics yesterday. Feels   better today.   Wt Readings from Last 3 Encounters:  03/31/16 150 lb (68 kg)  02/19/16 149 lb 8 oz (67.8 kg)  01/31/16 152 lb 9.6 oz (69.2 kg)  09-2015 ---------154 lb 03-2015--------159  Review of Systems Occasional nausea, no abdominal pain or blood in the stools. Occasional cough, no hemoptysis No gross hematuria or dysuria.   Past Medical History:  Diagnosis Date  . Anxiety   . Arthritis   . Atrial fibrillation ([place])   . Cardiac arrhythmia due to congenital heart disease   . Chronic constipation   . COPD (chronic obstructive pulmonary disease) ([place])   . Depression   . GERD (gastroesophageal reflux disease)   . Hemorrhoid   . Hyperlipidemia   . Idiopathic hypotension   . Lightheadedness 11/2015  . Mycotic toenails 10/27/2012  . Parkinson disease ([place])   . Recurrent UTI   . 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 Varicose veins     Past Surgical History:  Procedure Laterality Date  . APPENDECTOMY  82 years old  . COLON RESECTION  2008  . vitriectomy  08-2015    Social History   Social History  . Marital status: Widowed    Spouse name: N/A  . Number of children: 2  . Years of  education: N/A   Occupational History  . retired- Ambulance person , elementary school [employer]  . Smoking status: Former Smoker    Packs/day: 2.00    Years: 30.00    Types: Cigarettes    Quit date: 04/21/1980  . Smokeless tobacco: Never Used     Comment: onset age 59 -72, up to > 1ppd (almost 2 ppd)  . Alcohol use No  . Drug use: No  . Sexual activity: No   Other Topics Concern  . Not on file   Social History Narrative   Widowed, lives alone. 1 child in [place] and 1 in [place]. 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        Medication List       Accurate as of 03/31/16 11:59 PM. Always use your most recent med list.          albuterol 108 (90 Base) MCG/ACT inhaler Commonly known as:  VENTOLIN HFA Inhale 2 puffs into the lungs 2 (two) times daily as needed for wheezing or shortness of breath.   apixaban 5 MG Tabs tablet Commonly known as:  ELIQUIS Take 5 mg by mouth 2 (two) times daily.   azelastine 0.1 % nasal spray Commonly known as:  ASTELIN Place 2 sprays into both nostrils at bedtime as needed for rhinitis. Use in each nostril  as directed   CALCIUM 1000 + D PO Take 1,000 mg by mouth daily.   diltiazem 30 MG tablet Commonly known as:  CARDIZEM Take 1 tablet (30 mg total) by mouth 4 (four) times daily. Take at onset on atrial fibrillation   DULoxetine 30 MG capsule Commonly known as:  CYMBALTA Take 1 capsule (30 mg total) by mouth daily.   fludrocortisone 0.1 MG tablet Commonly known as:  FLORINEF Take 0.1 mg by mouth daily.   meclizine 25 MG tablet Commonly known as:  ANTIVERT Take 1 or 2 po Q 6hrs for dizziness   SYMBICORT 160-4.5 MCG/ACT inhaler Generic drug:  budesonide-formoterol INHALE 2 PUFFS BY MOUTH INTO THE LUNGS TWICE DAILY.   SYSTANE BALANCE OP Apply 2 drops to eye 3 (three) times daily as needed.   trimethoprim 100 MG tablet Commonly known as:  TRIMPEX Take 1 tablet (100 mg total) by mouth daily.  Reported on 10/03/2015   vitamin B-12 1000 MCG tablet Commonly known as:  CYANOCOBALAMIN Take 1,000 mcg by mouth daily.   vitamin C 1000 MG tablet Take 1,000 mg by mouth daily.          Objective:   Physical Exam BP 124/68 (BP Location: Left Arm, Patient Position: Sitting, Cuff Size: Small)   Pulse 100   Temp 98.1 F (36.7 C) (Oral)   Resp 14   Ht '5\' 10"'$  (1.778 m)   Wt 150 lb (68 kg)   SpO2 97%   BMI 21.52 kg/m  General:   Well developed, well nourished . NAD.  HEENT:  Normocephalic . Face symmetric, atraumatic Lungs:  CTA B Normal respiratory effort, no intercostal retractions, no accessory muscle use. Heart: Irregular..  No pretibial edema bilaterally  Abdomen: No organomegaly, no bruit. Soft nontender Skin:  Significant left lower extremity varicose veins without redness, ulcers. Good pedal pulses, no edema, some hyperpigmentation distally Neurologic:  alert & oriented X3.  Speech normal, gait at baseline , assisted Psych--  Cognition and judgment appear intact.  Cooperative with normal attention span and concentration.  Behavior appropriate. No anxious or depressed appearing.      Assessment & Plan:   Assessment   A1c 5.7 01-2015 Hyperlipidemia Anxiety depression  Dr Ferdinand Lango (psychology), meds per pcp (see OV note from 02-19-16) Sx onset ~ 2007:  2012, 2013 was on citalopram. Then sertraline. Unable to tolerate Prozac on Wellbutrin 02-2015 (tired, dizzy). Trial with Cymbalta 01-2016. COPD   CV: P-Atrial fibrillation Dr Gerrit Friends, flecanaide prn /cardiazem prn. Echo 11-2015 @ [facility] Low BP- likely d/t parkinson, rx fludrocortisone ~ 11-2015  GI: --GERD --Abnormal abdominal US mild pancreatic duct enlargement  --IBS used to be constipated, now diarrhea predominant --dysphagia MBE 02-22-15 wnl DJD--- take Celebrex very seldom Recurrent UTIs -- qd abx , has seen urology Dr Edwena Blow before NEURO: ---Parkinsonism ---  Dr Tat ---Gait d/o (multifactorial  likely) ---Peripheral neuropathy (pr neuro note 04-2015) ---mild cognitive impairmentairment (see neuro note 04-2015), dx confirmed by neuropsychiatry evaluation 07-2015 ---MBE 02/22/2015 normal admitted  01-2015: COPD and RVR   PLAN   Anxiety depression: Doing very well on Cymbalta. No s/e. Satisfied with current dose. Weight loss: Documented 9 pounds weight loss over the last year, unintentional. Recent TSH and CBC satisfactory. Will check a chest x-ray. UTIs: self started abx yesterday, sx were a " funny" urine smell, back pain and fever (?). Subjectively better today, continue antibiotics for 3 days then stop. Will check a UA and urine culture. Failure to thrive: More limited on  her activities of daily living, no family in the area, will put the home health agency referral. I strongly advised her to discuss placement with her family and see what resources are available. RTC 4 months Face-to-face more than 25

## 2016-03-31 NOTE — Patient Instructions (Signed)
GO TO THE LAB : Provide a urine sample   GO TO THE FRONT DESK Schedule your next appointment for a  checkup in 4 months   STOP BY THE FIRST FLOOR:  get the XR    Take antibiotics for 2 additional days, then sStop.

## 2016-03-31 NOTE — Progress Notes (Signed)
Pre visit review using our clinic review tool, if applicable. No additional management support is needed unless otherwise documented below in the visit note. 

## 2016-04-01 DIAGNOSIS — F418 Other specified anxiety disorders: Secondary | ICD-10-CM | POA: Diagnosis not present

## 2016-04-01 DIAGNOSIS — R627 Adult failure to thrive: Secondary | ICD-10-CM | POA: Diagnosis not present

## 2016-04-01 DIAGNOSIS — R413 Other amnesia: Secondary | ICD-10-CM | POA: Diagnosis not present

## 2016-04-01 DIAGNOSIS — M6281 Muscle weakness (generalized): Secondary | ICD-10-CM | POA: Diagnosis not present

## 2016-04-01 DIAGNOSIS — R2689 Other abnormalities of gait and mobility: Secondary | ICD-10-CM | POA: Diagnosis not present

## 2016-04-01 DIAGNOSIS — G20 Parkinson's disease: Secondary | ICD-10-CM | POA: Diagnosis not present

## 2016-04-01 LAB — URINE CULTURE: Organism ID, Bacteria: NO GROWTH

## 2016-04-01 NOTE — Assessment & Plan Note (Signed)
Anxiety depression: Doing very well on Cymbalta. No s/e. Satisfied with current dose. Weight loss: Documented 9 pounds weight loss over the last year, unintentional. Recent TSH and CBC satisfactory. Will check a chest x-ray. UTIs: self started abx yesterday, sx were a " funny" urine smell, back pain and fever (?). Subjectively better today, continue antibiotics for 3 days then stop. Will check a UA and urine culture. Failure to thrive: More limited on her activities of daily living, no family in the area, will put the home health agency referral. I strongly advised her to discuss placement with her family and see what resources are available. RTC 4 months

## 2016-04-02 ENCOUNTER — Encounter: Payer: Self-pay | Admitting: Podiatry

## 2016-04-02 ENCOUNTER — Telehealth: Payer: Self-pay | Admitting: Internal Medicine

## 2016-04-02 ENCOUNTER — Ambulatory Visit (INDEPENDENT_AMBULATORY_CARE_PROVIDER_SITE_OTHER): Payer: Medicare Other | Admitting: Podiatry

## 2016-04-02 VITALS — Ht 70.0 in | Wt 150.0 lb

## 2016-04-02 DIAGNOSIS — R413 Other amnesia: Secondary | ICD-10-CM | POA: Diagnosis not present

## 2016-04-02 DIAGNOSIS — R627 Adult failure to thrive: Secondary | ICD-10-CM | POA: Diagnosis not present

## 2016-04-02 DIAGNOSIS — R2689 Other abnormalities of gait and mobility: Secondary | ICD-10-CM | POA: Diagnosis not present

## 2016-04-02 DIAGNOSIS — M79676 Pain in unspecified toe(s): Secondary | ICD-10-CM

## 2016-04-02 DIAGNOSIS — B351 Tinea unguium: Secondary | ICD-10-CM | POA: Diagnosis not present

## 2016-04-02 DIAGNOSIS — G20 Parkinson's disease: Secondary | ICD-10-CM | POA: Diagnosis not present

## 2016-04-02 DIAGNOSIS — F418 Other specified anxiety disorders: Secondary | ICD-10-CM | POA: Diagnosis not present

## 2016-04-02 DIAGNOSIS — M6281 Muscle weakness (generalized): Secondary | ICD-10-CM | POA: Diagnosis not present

## 2016-04-02 NOTE — Telephone Encounter (Signed)
°  Amber with [facility] would like a return call regarding patient's plan of care. Please advise.   Amber phone: [phone]

## 2016-04-02 NOTE — Progress Notes (Signed)
Patient ID: [name], female   DOB: 08/08/1927, 82 y.o.   MRN: [id number] Complaint:  Visit Type: Patient returns to my office for continued preventative foot care services. Complaint: Patient states" my nails have grown long and thick and become painful to walk and wear shoes" . She presents for preventative foot care services. No changes to ROS.  She says she has been hospitalized with atrial fib and asthma.  Podiatric Exam: Vascular: dorsalis pedis and posterior tibial pulses are palpable bilateral. Capillary return is immediate. Temperature gradient is WNL. Skin turgor WNL  Sensorium: Normal Semmes Weinstein monofilament test. Normal tactile sensation bilaterally. Nail Exam: Pt has thick disfigured discolored nails with subungual debris noted bilateral entire nail hallux through fifth toenails Ulcer Exam: There is no evidence of ulcer or pre-ulcerative changes or infection. Orthopedic Exam: Muscle tone and strength are WNL. No limitations in general ROM. No crepitus or effusions noted. Foot type and digits show no abnormalities. Bony prominences are unremarkable. Skin: No Porokeratosis. No infection or ulcers  Diagnosis:  Tinea unguium, Pain in right toe, pain in left toes  Treatment & Plan Procedures and Treatment: Consent by patient was obtained for treatment procedures. The patient understood the discussion of treatment and procedures well. All questions were answered thoroughly reviewed. Debridement of mycotic and hypertrophic toenails, 1 through 5 bilateral and clearing of subungual debris. No ulceration, no infection noted.  Return Visit-Office Procedure: Patient instructed to return to the office for a follow up visit 3 months for continued evaluation and treatment.   Yahshua Thibault DPM 

## 2016-04-02 NOTE — Telephone Encounter (Signed)
LMOM for [name], verbal orders given.

## 2016-04-02 NOTE — Telephone Encounter (Signed)
Spoke w/ [employer], she informed me that [facility] had been out to evaluate Pt already this week and recommended ST for Pt. Verbal orders given.

## 2016-04-02 NOTE — Telephone Encounter (Signed)
Almyra, physical therapist at [facility],  called to get verbal orders for patient for Pt 2x per week for 3 weeks.   [place] Phone: [phone]

## 2016-04-04 DIAGNOSIS — G20 Parkinson's disease: Secondary | ICD-10-CM | POA: Diagnosis not present

## 2016-04-04 DIAGNOSIS — M6281 Muscle weakness (generalized): Secondary | ICD-10-CM | POA: Diagnosis not present

## 2016-04-04 DIAGNOSIS — R413 Other amnesia: Secondary | ICD-10-CM | POA: Diagnosis not present

## 2016-04-04 DIAGNOSIS — R627 Adult failure to thrive: Secondary | ICD-10-CM | POA: Diagnosis not present

## 2016-04-04 DIAGNOSIS — R2689 Other abnormalities of gait and mobility: Secondary | ICD-10-CM | POA: Diagnosis not present

## 2016-04-04 DIAGNOSIS — F418 Other specified anxiety disorders: Secondary | ICD-10-CM | POA: Diagnosis not present

## 2016-04-07 DIAGNOSIS — R627 Adult failure to thrive: Secondary | ICD-10-CM | POA: Diagnosis not present

## 2016-04-07 DIAGNOSIS — G20 Parkinson's disease: Secondary | ICD-10-CM | POA: Diagnosis not present

## 2016-04-08 ENCOUNTER — Telehealth: Payer: Self-pay

## 2016-04-08 NOTE — Telephone Encounter (Signed)
Received Home Health Plan of Care from [facility]. Plan of care reviewed, signed and faxed to [phone]. Form sent for scanning.

## 2016-04-10 ENCOUNTER — Ambulatory Visit (INDEPENDENT_AMBULATORY_CARE_PROVIDER_SITE_OTHER): Payer: Medicare Other | Admitting: Psychiatry

## 2016-04-10 DIAGNOSIS — R413 Other amnesia: Secondary | ICD-10-CM | POA: Diagnosis not present

## 2016-04-10 DIAGNOSIS — R627 Adult failure to thrive: Secondary | ICD-10-CM | POA: Diagnosis not present

## 2016-04-10 DIAGNOSIS — F411 Generalized anxiety disorder: Secondary | ICD-10-CM

## 2016-04-10 DIAGNOSIS — F418 Other specified anxiety disorders: Secondary | ICD-10-CM | POA: Diagnosis not present

## 2016-04-10 DIAGNOSIS — G20 Parkinson's disease: Secondary | ICD-10-CM | POA: Diagnosis not present

## 2016-04-10 DIAGNOSIS — R2689 Other abnormalities of gait and mobility: Secondary | ICD-10-CM | POA: Diagnosis not present

## 2016-04-10 DIAGNOSIS — M6281 Muscle weakness (generalized): Secondary | ICD-10-CM | POA: Diagnosis not present

## 2016-04-11 ENCOUNTER — Telehealth: Payer: Self-pay | Admitting: Internal Medicine

## 2016-04-11 NOTE — Telephone Encounter (Signed)
Caller name: Linus Orn Relationship to patient: [facility] Can be reached: [phone] Pharmacy:  Reason for call: Called to inform provider that patient cancelled visit for today. STates they will see her once next week.

## 2016-04-11 NOTE — Telephone Encounter (Signed)
Noted  

## 2016-04-15 DIAGNOSIS — R2689 Other abnormalities of gait and mobility: Secondary | ICD-10-CM | POA: Diagnosis not present

## 2016-04-15 DIAGNOSIS — F418 Other specified anxiety disorders: Secondary | ICD-10-CM | POA: Diagnosis not present

## 2016-04-15 DIAGNOSIS — R413 Other amnesia: Secondary | ICD-10-CM | POA: Diagnosis not present

## 2016-04-15 DIAGNOSIS — G20 Parkinson's disease: Secondary | ICD-10-CM | POA: Diagnosis not present

## 2016-04-15 DIAGNOSIS — R627 Adult failure to thrive: Secondary | ICD-10-CM | POA: Diagnosis not present

## 2016-04-15 DIAGNOSIS — M6281 Muscle weakness (generalized): Secondary | ICD-10-CM | POA: Diagnosis not present

## 2016-04-16 ENCOUNTER — Telehealth: Payer: Self-pay | Admitting: Internal Medicine

## 2016-04-16 MED ORDER — DULOXETINE HCL 30 MG PO CPEP: 30.0000 mg | ORAL_CAPSULE | Freq: Every day | ORAL | 3 refills | Status: DC

## 2016-04-16 NOTE — Telephone Encounter (Signed)
Rx sent 

## 2016-04-16 NOTE — Telephone Encounter (Signed)
Relation to LN:LGXQ Call back [place]: [facility], [address] [facility] [phone] (Phone) [phone] (Fax)     Reason for call:  Patient requesting a refill DULoxetine (CYMBALTA) 30 MG capsule

## 2016-04-17 ENCOUNTER — Telehealth: Payer: Self-pay | Admitting: Internal Medicine

## 2016-04-17 DIAGNOSIS — R2689 Other abnormalities of gait and mobility: Secondary | ICD-10-CM | POA: Diagnosis not present

## 2016-04-17 DIAGNOSIS — R627 Adult failure to thrive: Secondary | ICD-10-CM | POA: Diagnosis not present

## 2016-04-17 DIAGNOSIS — R413 Other amnesia: Secondary | ICD-10-CM | POA: Diagnosis not present

## 2016-04-17 DIAGNOSIS — G20 Parkinson's disease: Secondary | ICD-10-CM | POA: Diagnosis not present

## 2016-04-17 DIAGNOSIS — M6281 Muscle weakness (generalized): Secondary | ICD-10-CM | POA: Diagnosis not present

## 2016-04-17 DIAGNOSIS — F418 Other specified anxiety disorders: Secondary | ICD-10-CM | POA: Diagnosis not present

## 2016-04-17 NOTE — Telephone Encounter (Signed)
Spoke w/ Constance Haw, verbal orders given.

## 2016-04-17 NOTE — Telephone Encounter (Signed)
[facility] is calling to get a verbal order for 2 weeks 2 times for home physical therapy. Please advise  Phone: [phone]

## 2016-04-22 DIAGNOSIS — R627 Adult failure to thrive: Secondary | ICD-10-CM | POA: Diagnosis not present

## 2016-04-22 DIAGNOSIS — R2689 Other abnormalities of gait and mobility: Secondary | ICD-10-CM | POA: Diagnosis not present

## 2016-04-22 DIAGNOSIS — R413 Other amnesia: Secondary | ICD-10-CM | POA: Diagnosis not present

## 2016-04-22 DIAGNOSIS — M6281 Muscle weakness (generalized): Secondary | ICD-10-CM | POA: Diagnosis not present

## 2016-04-22 DIAGNOSIS — F418 Other specified anxiety disorders: Secondary | ICD-10-CM | POA: Diagnosis not present

## 2016-04-22 DIAGNOSIS — G20 Parkinson's disease: Secondary | ICD-10-CM | POA: Diagnosis not present

## 2016-04-23 DIAGNOSIS — R2689 Other abnormalities of gait and mobility: Secondary | ICD-10-CM | POA: Diagnosis not present

## 2016-04-23 DIAGNOSIS — R413 Other amnesia: Secondary | ICD-10-CM | POA: Diagnosis not present

## 2016-04-23 DIAGNOSIS — F418 Other specified anxiety disorders: Secondary | ICD-10-CM | POA: Diagnosis not present

## 2016-04-23 DIAGNOSIS — G20 Parkinson's disease: Secondary | ICD-10-CM | POA: Diagnosis not present

## 2016-04-23 DIAGNOSIS — R627 Adult failure to thrive: Secondary | ICD-10-CM | POA: Diagnosis not present

## 2016-04-23 DIAGNOSIS — M6281 Muscle weakness (generalized): Secondary | ICD-10-CM | POA: Diagnosis not present

## 2016-04-24 DIAGNOSIS — F418 Other specified anxiety disorders: Secondary | ICD-10-CM | POA: Diagnosis not present

## 2016-04-24 DIAGNOSIS — R413 Other amnesia: Secondary | ICD-10-CM | POA: Diagnosis not present

## 2016-04-24 DIAGNOSIS — M6281 Muscle weakness (generalized): Secondary | ICD-10-CM | POA: Diagnosis not present

## 2016-04-24 DIAGNOSIS — R627 Adult failure to thrive: Secondary | ICD-10-CM | POA: Diagnosis not present

## 2016-04-24 DIAGNOSIS — R2689 Other abnormalities of gait and mobility: Secondary | ICD-10-CM | POA: Diagnosis not present

## 2016-04-24 DIAGNOSIS — G20 Parkinson's disease: Secondary | ICD-10-CM | POA: Diagnosis not present

## 2016-04-25 ENCOUNTER — Telehealth: Payer: Self-pay | Admitting: Internal Medicine

## 2016-04-25 NOTE — Telephone Encounter (Signed)
Pt dropped off document to be filled out by PCP ([facility] Res[facility] Information) 4 pages, Pt states if possible to have document filled out by Monday and fax to [phone], since she has an appt with them this day 04-28-2016. Document put at front office tray.

## 2016-04-28 NOTE — Telephone Encounter (Signed)
Form completed by PCP and faxed to ATTN: Leroy Sea at [facility]- [phone]. Forms sent for scanning. Pt informed that forms have been faxed.

## 2016-04-28 NOTE — Telephone Encounter (Signed)
Received fax confirmation

## 2016-04-28 NOTE — Telephone Encounter (Signed)
Have not seen form. Forwarding to [employer].

## 2016-04-28 NOTE — Telephone Encounter (Signed)
°  Relation to TD:HRCB Call back number: [phone]   Reason for call:  Patient checking on the status of form, please advise

## 2016-04-29 ENCOUNTER — Ambulatory Visit (INDEPENDENT_AMBULATORY_CARE_PROVIDER_SITE_OTHER): Payer: Medicare Other | Admitting: Psychiatry

## 2016-04-29 DIAGNOSIS — H17823 Peripheral opacity of cornea, bilateral: Secondary | ICD-10-CM | POA: Diagnosis not present

## 2016-04-29 DIAGNOSIS — F411 Generalized anxiety disorder: Secondary | ICD-10-CM

## 2016-04-29 DIAGNOSIS — H04123 Dry eye syndrome of bilateral lacrimal glands: Secondary | ICD-10-CM | POA: Diagnosis not present

## 2016-04-29 DIAGNOSIS — H35373 Puckering of macula, bilateral: Secondary | ICD-10-CM | POA: Diagnosis not present

## 2016-04-30 DIAGNOSIS — R627 Adult failure to thrive: Secondary | ICD-10-CM | POA: Diagnosis not present

## 2016-04-30 DIAGNOSIS — G20 Parkinson's disease: Secondary | ICD-10-CM | POA: Diagnosis not present

## 2016-04-30 DIAGNOSIS — R413 Other amnesia: Secondary | ICD-10-CM | POA: Diagnosis not present

## 2016-04-30 DIAGNOSIS — M6281 Muscle weakness (generalized): Secondary | ICD-10-CM | POA: Diagnosis not present

## 2016-04-30 DIAGNOSIS — F418 Other specified anxiety disorders: Secondary | ICD-10-CM | POA: Diagnosis not present

## 2016-04-30 DIAGNOSIS — R2689 Other abnormalities of gait and mobility: Secondary | ICD-10-CM | POA: Diagnosis not present

## 2016-04-30 NOTE — Telephone Encounter (Signed)
Forms missing page 3, received page 3 from Leroy Sea at [facility] for completion. Forms completed and faxed to [phone]. Additional form sent for scanning.

## 2016-05-01 DIAGNOSIS — G20 Parkinson's disease: Secondary | ICD-10-CM | POA: Diagnosis not present

## 2016-05-01 DIAGNOSIS — M6281 Muscle weakness (generalized): Secondary | ICD-10-CM | POA: Diagnosis not present

## 2016-05-01 DIAGNOSIS — F418 Other specified anxiety disorders: Secondary | ICD-10-CM | POA: Diagnosis not present

## 2016-05-01 DIAGNOSIS — R2689 Other abnormalities of gait and mobility: Secondary | ICD-10-CM | POA: Diagnosis not present

## 2016-05-01 DIAGNOSIS — R627 Adult failure to thrive: Secondary | ICD-10-CM | POA: Diagnosis not present

## 2016-05-01 DIAGNOSIS — R413 Other amnesia: Secondary | ICD-10-CM | POA: Diagnosis not present

## 2016-05-02 DIAGNOSIS — R413 Other amnesia: Secondary | ICD-10-CM | POA: Diagnosis not present

## 2016-05-02 DIAGNOSIS — R2689 Other abnormalities of gait and mobility: Secondary | ICD-10-CM | POA: Diagnosis not present

## 2016-05-02 DIAGNOSIS — G20 Parkinson's disease: Secondary | ICD-10-CM | POA: Diagnosis not present

## 2016-05-02 DIAGNOSIS — R627 Adult failure to thrive: Secondary | ICD-10-CM | POA: Diagnosis not present

## 2016-05-02 DIAGNOSIS — M6281 Muscle weakness (generalized): Secondary | ICD-10-CM | POA: Diagnosis not present

## 2016-05-02 DIAGNOSIS — F418 Other specified anxiety disorders: Secondary | ICD-10-CM | POA: Diagnosis not present

## 2016-05-09 ENCOUNTER — Telehealth: Payer: Self-pay | Admitting: Internal Medicine

## 2016-05-09 NOTE — Telephone Encounter (Signed)
Sandy with [facility] is calling to request verbal orders for missed therapy this week due to other scheduled appts and weather. Also, another verbal order for a continuation of Speech Therapy for 2 weeks. Please advise  Lovey Newcomer phone: [phone]

## 2016-05-09 NOTE — Telephone Encounter (Signed)
Spoke w/ Lovey Newcomer, verbal orders given.

## 2016-05-13 ENCOUNTER — Ambulatory Visit: Payer: Medicare Other | Admitting: Physical Therapy

## 2016-05-15 ENCOUNTER — Ambulatory Visit (INDEPENDENT_AMBULATORY_CARE_PROVIDER_SITE_OTHER): Payer: Medicare Other | Admitting: Internal Medicine

## 2016-05-15 ENCOUNTER — Encounter: Payer: Self-pay | Admitting: Internal Medicine

## 2016-05-15 VITALS — BP 124/70 | HR 114 | Temp 97.3°F | Resp 14 | Ht 70.0 in | Wt 151.1 lb

## 2016-05-15 DIAGNOSIS — F419 Anxiety disorder, unspecified: Principal | ICD-10-CM

## 2016-05-15 DIAGNOSIS — R2689 Other abnormalities of gait and mobility: Secondary | ICD-10-CM | POA: Diagnosis not present

## 2016-05-15 DIAGNOSIS — F418 Other specified anxiety disorders: Secondary | ICD-10-CM | POA: Diagnosis not present

## 2016-05-15 DIAGNOSIS — G20 Parkinson's disease: Secondary | ICD-10-CM | POA: Diagnosis not present

## 2016-05-15 DIAGNOSIS — R413 Other amnesia: Secondary | ICD-10-CM | POA: Diagnosis not present

## 2016-05-15 DIAGNOSIS — R627 Adult failure to thrive: Secondary | ICD-10-CM | POA: Diagnosis not present

## 2016-05-15 DIAGNOSIS — M6281 Muscle weakness (generalized): Secondary | ICD-10-CM | POA: Diagnosis not present

## 2016-05-15 DIAGNOSIS — F329 Major depressive disorder, single episode, unspecified: Secondary | ICD-10-CM

## 2016-05-15 NOTE — Progress Notes (Signed)
Pre visit review using our clinic review tool, if applicable. No additional management support is needed unless otherwise documented below in the visit note. 

## 2016-05-15 NOTE — Assessment & Plan Note (Signed)
Anxiety depression: See HPI. After living independently, she will move to a facility, she understand this is somewhat stressful but does not believe she needs an adjustment on her medication. I advised her to go ahead and see a counselor sooner than planned, call anytime if she feels her medication needs to be adjusted. She is somewhat concerned about some financial arrangements she has been proposed, told pt I cannot advise her about that, recommend to discuss with her family first and take it from there .

## 2016-05-15 NOTE — Progress Notes (Signed)
Subjective:    Patient ID: [name], female    DOB: 1928/02/21, 83 y.o.   MRN: [id number]  DOS:  05/15/2016 Type of visit - description : acute Interval history:  Pt is moving to a facility [facility] The staff over there advise her to see Dr. Ferdinand Lango, her counselor sooner than planned and to come here for eval and possible  increase in Cymbalta. The patient reports they think she is very anxious. She admits to be somewhat stressed about the move to a new place but overall she feels she is doing well emotionally, states she is an anxious person to begin with and she understands that a moving may exacerbate her symptoms a little bit. She does not believe a medication change is in order.  She is somewhat concerned about the financial and moving arrangements they are proposing.  Review of Systems Denies suicidal ideas No problems with insomnia.   Past Medical History:  Diagnosis Date  . Anxiety   . Arthritis   . Atrial fibrillation ([place])   . Cardiac arrhythmia due to congenital heart disease   . Chronic constipation   . COPD (chronic obstructive pulmonary disease) ([place])   . Depression   . GERD (gastroesophageal reflux disease)   . Hemorrhoid   . Hyperlipidemia   . Idiopathic hypotension   . Lightheadedness 11/2015  . Mycotic toenails 10/27/2012  . Parkinson disease ([place])   . Recurrent UTI   . 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 Varicose veins     Past Surgical History:  Procedure Laterality Date  . APPENDECTOMY  83 years old  . COLON RESECTION  2008  . vitriectomy  08-2015    Social History   Social History  . Marital status: Widowed    Spouse name: N/A  . Number of children: 2  . Years of education: N/A   Occupational History  . retired- Ambulance person , elementary school [employer]  . Smoking status: Former Smoker    Packs/day: 2.00    Years: 30.00    Types: Cigarettes    Quit date: 04/21/1980  . Smokeless tobacco:  Never Used     Comment: onset age 61 -57, up to > 1ppd (almost 2 ppd)  . Alcohol use No  . Drug use: No  . Sexual activity: No   Other Topics Concern  . Not on file   Social History Narrative   Widowed, lives alone. 1 child in [place] and 1 in [place]. 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      Allergies as of 05/15/2016      Reactions   Adhesive [tape] Other (See Comments)   Caused blisters   Amoxicillin Other (See Comments)   Caused thrush   Ciprofloxacin Other (See Comments)   Caused thrush   Ultram [tramadol] Other (See Comments)   hypotension      Medication List       Accurate as of 05/15/16  3:27 PM. Always use your most recent med list.          albuterol 108 (90 Base) MCG/ACT inhaler Commonly known as:  VENTOLIN HFA Inhale 2 puffs into the lungs 2 (two) times daily as needed for wheezing or shortness of breath.   apixaban 5 MG Tabs tablet Commonly known as:  ELIQUIS Take 5 mg by mouth 2 (two) times daily.   azelastine 0.1 % nasal spray Commonly known as:  ASTELIN  Place 2 sprays into both nostrils at bedtime as needed for rhinitis. Use in each nostril as directed   CALCIUM 1000 + D PO Take 1,000 mg by mouth daily.   diltiazem 30 MG tablet Commonly known as:  CARDIZEM Take 1 tablet (30 mg total) by mouth 4 (four) times daily. Take at onset on atrial fibrillation   DULoxetine 30 MG capsule Commonly known as:  CYMBALTA Take 1 capsule (30 mg total) by mouth daily.   fludrocortisone 0.1 MG tablet Commonly known as:  FLORINEF Take 0.1 mg by mouth daily.   meclizine 25 MG tablet Commonly known as:  ANTIVERT Take 1 or 2 po Q 6hrs for dizziness   SYMBICORT 160-4.5 MCG/ACT inhaler Generic drug:  budesonide-formoterol INHALE 2 PUFFS BY MOUTH INTO THE LUNGS TWICE DAILY.   SYSTANE BALANCE OP Apply 2 drops to eye 3 (three) times daily as needed.   trimethoprim 100 MG tablet Commonly known as:  TRIMPEX Take 1 tablet (100 mg total) by mouth  daily. Reported on 10/03/2015   vitamin B-12 1000 MCG tablet Commonly known as:  CYANOCOBALAMIN Take 1,000 mcg by mouth daily.   vitamin C 1000 MG tablet Take 1,000 mg by mouth daily.          Objective:   Physical Exam BP 124/70 (BP Location: Left Arm, Patient Position: Sitting, Cuff Size: Small)   Pulse (!) 114   Temp 97.3 F (36.3 C) (Oral)   Resp 14   Ht '5\' 10"'$  (1.778 m)   Wt 151 lb 2 oz (68.5 kg)   SpO2 97%   BMI 21.68 kg/m  General:   Well developed, well nourished . NAD.  HEENT:  Normocephalic . Face symmetric, atraumatic Skin: Not pale. Not jaundice Neurologic:  alert & oriented X3.  Speech normal, walks with some difficulty, assisted by a cane Psych--  Cognition and judgment appear intact.  Cooperative with normal attention span and concentration.  Behavior appropriate. No depressed appearing, slightly anxious appearing, at baseline     Assessment & Plan:   Assessment   A1c 5.7 01-2015 Hyperlipidemia Anxiety depression  Dr Ferdinand Lango (psychology), meds per pcp (see OV note from 02-19-16) Sx onset ~ 2007:  2012, 2013 was on citalopram. Then sertraline. Unable to tolerate Prozac on Wellbutrin 02-2015 (tired, dizzy). Trial with Cymbalta 01-2016. COPD   CV: P-Atrial fibrillation Dr Gerrit Friends, flecanaide prn /cardiazem prn. Echo 11-2015 @ [facility] Low BP- likely d/t parkinson, rx fludrocortisone ~ 11-2015  GI: --GERD --Abnormal abdominal US mild pancreatic duct enlargement  --IBS used to be constipated, now diarrhea predominant --dysphagia MBE 02-22-15 wnl DJD--- take Celebrex very seldom Recurrent UTIs -- qd abx , has seen urology Dr Edwena Blow before NEURO: ---Parkinsonism ---  Dr Tat ---Gait d/o (multifactorial likely) ---Peripheral neuropathy (pr neuro note 04-2015) ---mild cognitive impairmentairment (see neuro note 04-2015), dx confirmed by neuropsychiatry evaluation 07-2015 ---MBE 02/22/2015 normal admitted  01-2015: COPD and RVR   PLAN   Anxiety  depression: See HPI. After living independently, she will move to a facility, she understand this is somewhat stressful but does not believe she needs an adjustment on her medication. I advised her to go ahead and see a counselor sooner than planned, call anytime if she feels her medication needs to be adjusted. She is somewhat concerned about some financial arrangements she has been proposed, told pt I cannot advise her about that, recommend to discuss with her family first and take it from there .

## 2016-05-15 NOTE — Patient Instructions (Addendum)
Continue same medicines  See Dr Ferdinand Lango next week as you are planning  Call anytime or come back if you need to adjust your medicines  Next visit by 07-2016, please make an appointment

## 2016-05-20 DIAGNOSIS — F418 Other specified anxiety disorders: Secondary | ICD-10-CM | POA: Diagnosis not present

## 2016-05-20 DIAGNOSIS — R627 Adult failure to thrive: Secondary | ICD-10-CM | POA: Diagnosis not present

## 2016-05-20 DIAGNOSIS — G20 Parkinson's disease: Secondary | ICD-10-CM | POA: Diagnosis not present

## 2016-05-20 DIAGNOSIS — R2689 Other abnormalities of gait and mobility: Secondary | ICD-10-CM | POA: Diagnosis not present

## 2016-05-20 DIAGNOSIS — R413 Other amnesia: Secondary | ICD-10-CM | POA: Diagnosis not present

## 2016-05-20 DIAGNOSIS — M6281 Muscle weakness (generalized): Secondary | ICD-10-CM | POA: Diagnosis not present

## 2016-05-22 ENCOUNTER — Ambulatory Visit (INDEPENDENT_AMBULATORY_CARE_PROVIDER_SITE_OTHER): Payer: Medicare Other | Admitting: Psychiatry

## 2016-05-22 DIAGNOSIS — F411 Generalized anxiety disorder: Secondary | ICD-10-CM | POA: Diagnosis not present

## 2016-05-28 ENCOUNTER — Emergency Department (HOSPITAL_COMMUNITY): Payer: Medicare Other

## 2016-05-28 ENCOUNTER — Ambulatory Visit (INDEPENDENT_AMBULATORY_CARE_PROVIDER_SITE_OTHER): Payer: Medicare Other | Admitting: Psychiatry

## 2016-05-28 ENCOUNTER — Encounter (HOSPITAL_COMMUNITY): Payer: Self-pay | Admitting: *Deleted

## 2016-05-28 ENCOUNTER — Emergency Department (HOSPITAL_COMMUNITY)
Admission: EM | Admit: 2016-05-28 | Discharge: 2016-05-28 | Disposition: A | Discharge status: Home / Self Care | Payer: Medicare Other | Attending: Emergency Medicine | Admitting: Emergency Medicine

## 2016-05-28 DIAGNOSIS — R1032 Left lower quadrant pain: Secondary | ICD-10-CM | POA: Diagnosis not present

## 2016-05-28 DIAGNOSIS — G20 Parkinson's disease: Secondary | ICD-10-CM | POA: Diagnosis not present

## 2016-05-28 DIAGNOSIS — F411 Generalized anxiety disorder: Secondary | ICD-10-CM

## 2016-05-28 DIAGNOSIS — K5641 Fecal impaction: Secondary | ICD-10-CM | POA: Insufficient documentation

## 2016-05-28 DIAGNOSIS — Z7901 Long term (current) use of anticoagulants: Secondary | ICD-10-CM | POA: Diagnosis not present

## 2016-05-28 DIAGNOSIS — Z79899 Other long term (current) drug therapy: Secondary | ICD-10-CM | POA: Diagnosis not present

## 2016-05-28 DIAGNOSIS — J449 Chronic obstructive pulmonary disease, unspecified: Secondary | ICD-10-CM | POA: Insufficient documentation

## 2016-05-28 DIAGNOSIS — K5900 Constipation, unspecified: Secondary | ICD-10-CM

## 2016-05-28 DIAGNOSIS — Z87891 Personal history of nicotine dependence: Secondary | ICD-10-CM | POA: Diagnosis not present

## 2016-05-28 DIAGNOSIS — R140 Abdominal distension (gaseous): Secondary | ICD-10-CM | POA: Diagnosis not present

## 2016-05-28 DIAGNOSIS — K2970 Gastritis, unspecified, without bleeding: Secondary | ICD-10-CM | POA: Diagnosis not present

## 2016-05-28 LAB — COMPREHENSIVE METABOLIC PANEL
ALK PHOS: 72 U/L (ref 38–126)
ALT: 14 U/L (ref 14–54)
ANION GAP: 6 (ref 5–15)
AST: 21 U/L (ref 15–41)
Albumin: 3.7 g/dL (ref 3.5–5.0)
BUN: 12 mg/dL (ref 6–20)
CALCIUM: 8.8 mg/dL — AB (ref 8.9–10.3)
CO2: 30 mmol/L (ref 22–32)
Chloride: 101 mmol/L (ref 101–111)
Creatinine, Ser: 0.74 mg/dL (ref 0.44–1.00)
GFR calc Af Amer: >60 mL/min (ref >60)
GFR calc non Af Amer: >60 mL/min (ref >60)
Glucose, Bld: 133 mg/dL — ABNORMAL HIGH (ref 65–99)
Potassium: 3.4 mmol/L — ABNORMAL LOW (ref 3.5–5.1)
Sodium: 137 mmol/L (ref 135–145)
Total Bilirubin: 0.5 mg/dL (ref 0.3–1.2)
Total Protein: 6.3 g/dL — ABNORMAL LOW (ref 6.5–8.1)

## 2016-05-28 LAB — URINALYSIS, ROUTINE W REFLEX MICROSCOPIC
BILIRUBIN URINE: NEGATIVE
GLUCOSE, UA: NEGATIVE mg/dL
Hgb urine dipstick: NEGATIVE
KETONES UR: NEGATIVE mg/dL
Leukocytes, UA: NEGATIVE
Nitrite: NEGATIVE
PH: 7 (ref 5.0–8.0)
Protein, ur: NEGATIVE mg/dL
Specific Gravity, Urine: 1.026 (ref 1.005–1.030)

## 2016-05-28 LAB — CBC WITH DIFFERENTIAL/PLATELET
BASOS PCT: 0 %
Basophils Absolute: 0 10*3/uL (ref 0.0–0.1)
Eosinophils Absolute: 0.1 10*3/uL (ref 0.0–0.7)
Eosinophils Relative: 2 %
HEMATOCRIT: 36.3 % (ref 36.0–46.0)
Hemoglobin: 11.7 g/dL — ABNORMAL LOW (ref 12.0–15.0)
LYMPHS PCT: 25 %
Lymphs Abs: 1.4 10*3/uL (ref 0.7–4.0)
MCH: 29.2 pg (ref 26.0–34.0)
MCHC: 32.2 g/dL (ref 30.0–36.0)
MCV: 90.5 fL (ref 78.0–100.0)
MONO ABS: 0.5 10*3/uL (ref 0.1–1.0)
MONOS PCT: 9 %
NEUTROS ABS: 3.7 10*3/uL (ref 1.7–7.7)
Neutrophils Relative %: 64 %
Platelets: 167 10*3/uL (ref 150–400)
RBC: 4.01 MIL/uL (ref 3.87–5.11)
RDW: 14.9 % (ref 11.5–15.5)
WBC: 5.7 10*3/uL (ref 4.0–10.5)

## 2016-05-28 MED ORDER — POLYETHYLENE GLYCOL 3350 17 GM/SCOOP PO POWD: 17.0000 g | Freq: Two times a day (BID) | ORAL | 0 refills | Status: AC

## 2016-05-28 MED ORDER — DICYCLOMINE HCL 10 MG PO CAPS
20.0000 mg | ORAL_CAPSULE | Freq: Once | ORAL | Status: AC
  Administered 2016-05-28: 20 mg via ORAL
  Filled 2016-05-28: qty 2

## 2016-05-28 MED ORDER — DICYCLOMINE HCL 20 MG PO TABS: 20.0000 mg | ORAL_TABLET | Freq: Two times a day (BID) | ORAL | 0 refills | Status: DC | PRN

## 2016-05-28 MED ORDER — SORBITOL 70 % SOLN
960.0000 mL | TOPICAL_OIL | Freq: Once | ORAL | Status: AC
  Administered 2016-05-28: 960 mL via RECTAL
  Filled 2016-05-28: qty 240

## 2016-05-28 MED ORDER — IOPAMIDOL (ISOVUE-300) INJECTION 61%
100.0000 mL | Freq: Once | INTRAVENOUS | Status: AC | PRN
  Administered 2016-05-28: 100 mL via INTRAVENOUS

## 2016-05-28 MED ORDER — SODIUM CHLORIDE 0.9 % IV BOLUS (SEPSIS)
500.0000 mL | Freq: Once | INTRAVENOUS | Status: AC
  Administered 2016-05-28: 500 mL via INTRAVENOUS

## 2016-05-28 NOTE — ED Triage Notes (Signed)
Per EMS pt complains of LLQ abdominal pain and constipation for 1 week. Pt states pain has gotten progressively worse. Pt comes from home. Pt denies n/v/d.

## 2016-05-28 NOTE — ED Provider Notes (Signed)
[facility] DEPT Provider Note   CSN: [id number] Arrival date & time: 05/28/16  1517     History   Chief Complaint Chief Complaint  Patient presents with  . Abdominal Pain  . Constipation    HPI [name] is a 83 y.o. female.  [name] is a 83 y.o. Female with a history of a-fib on Eliquis and previous SBO who presents to the ED complaining of abdominal pain and constipation for the past week. Patient reports intermittent sharp generalized abdominal pain for the past 3 days. She reports no BM for the past week and reports feeling bloated and needing to have a BM. She has used milk of magnesia, Colace and today a fleets enema without success or bowel movement. She reports she has been passing gas. She does report a history of a small bowel obstruction 9 years ago. She denies other previous abdominal surgeries. She denies nausea, or vomiting. Patient denies urinary symptoms, fevers, nausea, vomiting, diarrhea, chest pain, shortness of breath, fevers or rashes.   The history is provided by the patient and medical records. No language interpreter was used.  Abdominal Pain   Associated symptoms include constipation. Pertinent negatives include fever, diarrhea, nausea, vomiting, dysuria, frequency and headaches.  Constipation   Associated symptoms include abdominal pain. Pertinent negatives include no dysuria.    Past Medical History:  Diagnosis Date  . Anxiety   . Arthritis   . Atrial fibrillation ([place])   . Cardiac arrhythmia due to congenital heart disease   . Chronic constipation   . COPD (chronic obstructive pulmonary disease) ([place])   . Depression   . GERD (gastroesophageal reflux disease)   . Hemorrhoid   . Hyperlipidemia   . Idiopathic hypotension   . Lightheadedness 11/2015  . Mycotic toenails 10/27/2012  . Parkinson disease ([place])   . Recurrent UTI   . 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 Varicose veins     Patient Active Problem List   Diagnosis Date Noted  . Failure to thrive in adult 04/01/2016  . Blurry vision, right eye   . PCP NOTES >>>>>>>>>>>>>>>>>>>>>>> 03/02/2015  . Atrial fibrillation with RVR ([place]) 01/31/2015  . COPD (chronic obstructive pulmonary disease) ([place]) 01/31/2015  . Lower abdominal pain 12/05/2014  . Other fatigue 06/21/2014  . Lightheadedness 06/12/2014  . Fall from other slipping, tripping, or stumbling 11/17/2013  . COPD exacerbation ([place]) 09/26/2013  . Musculoskeletal pain 05/21/2012  . IBS (irritable bowel syndrome) 04/02/2012  . Physical exam, annual 11/25/2011  . Anxiety and depression 07/10/2011  . Parkinson's disease ([place]) 02/03/2011  . Shoulder pain 02/03/2011  . Recurrent UTI 11/01/2010  . GERD (gastroesophageal reflux disease) 11/01/2010  . Memory loss 11/01/2010  . Hip pain, right 09/23/2010  . COMPRESSION FRACTURE, LUMBAR VERTEBRAE 03/28/2010  . Abdominal pain 03/27/2010  . DIZZINESS 03/22/2010  . Mycotic toenails 10/02/2009  . DYSPNEA ON EXERTION 01/19/2009  . HEMORRHOIDS-INTERNAL 02/14/2008  . CONSTIPATION 02/14/2008  . PERSONAL HX COLONIC POLYPS 02/14/2008    Past Surgical History:  Procedure Laterality Date  . APPENDECTOMY  83 years old  . COLON RESECTION  2008  . vitriectomy  08-2015    OB History    No data available       Home Medications    Prior to Admission medications   Medication Sig Start Date End Date Taking? Authorizing Provider  acetaminophen (TYLENOL) 500 MG tablet Take 500 mg by mouth every 6 (six) hours as needed.   Yes Historical Provider, MD  albuterol (VENTOLIN HFA) 108 (90 BASE) MCG/ACT inhaler Inhale 2 puffs into the lungs 2 (two) times daily as needed for wheezing or shortness of breath. 02/05/15  Yes Midge Minium, MD  apixaban (ELIQUIS) 5 MG TABS tablet Take 5 mg by mouth 2 (two) times daily.   Yes Historical Provider, MD  Ascorbic Acid (VITAMIN C) 1000 MG tablet Take 1,000 mg by mouth daily.   Yes Historical Provider, MD    azelastine (ASTELIN) 0.1 % nasal spray Place 2 sprays into both nostrils at bedtime as needed for rhinitis. Use in each nostril as directed 04/11/15  Yes Colon Branch, MD  Calcium Carb-Cholecalciferol (CALCIUM 1000 + D PO) Take 1,000 mg by mouth daily.   Yes Historical Provider, MD  diltiazem (CARDIZEM) 30 MG tablet Take 1 tablet (30 mg total) by mouth 4 (four) times daily. Take at onset on atrial fibrillation 02/02/15  Yes Orson Eva, MD  docusate sodium (COLACE) 100 MG capsule Take 300 mg by mouth 2 (two) times daily.   Yes Historical Provider, MD  DULoxetine (CYMBALTA) 30 MG capsule Take 1 capsule (30 mg total) by mouth daily. 04/16/16  Yes Colon Branch, MD  fludrocortisone (FLORINEF) 0.1 MG tablet Take 0.1 mg by mouth daily.   Yes Historical Provider, MD  meclizine (ANTIVERT) 25 MG tablet Take 1 or 2 po Q 6hrs for dizziness 11/24/15  Yes Rolland Porter, MD  Propylene Glycol (SYSTANE BALANCE OP) Apply 2 drops to eye 3 (three) times daily as needed.   Yes Historical Provider, MD  sodium phosphate (FLEET) 7-19 GM/118ML ENEM Place 1 enema rectally daily as needed for severe constipation.   Yes Historical Provider, MD  sodium phosphate Pediatric (FLEET) 3.5-9.5 GM/59ML enema Place 1 enema rectally once.   Yes Historical Provider, MD  SYMBICORT 160-4.5 MCG/ACT inhaler INHALE 2 PUFFS BY MOUTH INTO THE LUNGS TWICE DAILY. 11/09/15  Yes Brunetta Jeans, PA-C  vitamin B-12 (CYANOCOBALAMIN) 1000 MCG tablet Take 1,000 mcg by mouth daily.   Yes Historical Provider, MD  dicyclomine (BENTYL) 20 MG tablet Take 1 tablet (20 mg total) by mouth 2 (two) times daily as needed (for abdominal cramping). 05/28/16   Waynetta Pean, PA-C  polyethylene glycol powder (GLYCOLAX/MIRALAX) powder Take 17 g by mouth 2 (two) times daily. 05/28/16   Waynetta Pean, PA-C  trimethoprim (TRIMPEX) 100 MG tablet Take 1 tablet (100 mg total) by mouth daily. Reported on 10/03/2015 Patient not taking: Reported on 05/28/2016 01/31/16   Colon Branch, MD     Family History Family History  Problem Relation Age of Onset  . Asthma Brother   . Alcohol abuse Brother   . Throat cancer Father   . Alcohol abuse Father   . Lupus Daughter   . Bipolar disorder Daughter   . Lupus Daughter   . Anxiety disorder Daughter   . Alcohol abuse Sister   . Alcohol abuse Maternal Grandfather   . Alcohol abuse Paternal Grandmother   . Breast cancer Sister   . Colon cancer Neg Hx     Social History Social History  Substance Use Topics  . Smoking status: Former Smoker    Packs/day: 2.00    Years: 30.00    Types: Cigarettes    Quit date: 04/21/1980  . Smokeless tobacco: Never Used     Comment: onset age 56 -62, up to > 1ppd (almost 2 ppd)  . Alcohol use No     Allergies   Adhesive [tape]; Amoxicillin; Ciprofloxacin; and Ultram [tramadol]  Review of Systems Review of Systems  Constitutional: Negative for chills and fever.  HENT: Negative for congestion and sore throat.   Eyes: Negative for visual disturbance.  Respiratory: Negative for cough and shortness of breath.   Cardiovascular: Negative for chest pain.  Gastrointestinal: Positive for abdominal distention, abdominal pain and constipation. Negative for anal bleeding, blood in stool, diarrhea, nausea, rectal pain and vomiting.  Genitourinary: Negative for difficulty urinating, dysuria, frequency and urgency.  Musculoskeletal: Negative for back pain and neck pain.  Skin: Negative for rash.  Neurological: Negative for headaches.     Physical Exam Updated Vital Signs BP 137/88 (BP Location: Right Arm)   Pulse 78   Temp 98 F (36.7 C) (Oral)   Resp 18   Ht '5\' 10"'$  (1.778 m)   Wt 69.4 kg   SpO2 96%   BMI 21.95 kg/m   Physical Exam  Constitutional: She is oriented to person, place, and time. She appears well-developed and well-nourished. No distress.  Nontoxic appearing.  HENT:  Head: Normocephalic and atraumatic.  Mouth/Throat: Oropharynx is clear and moist.  Eyes:  Conjunctivae are normal. Pupils are equal, round, and reactive to light. Right eye exhibits no discharge. Left eye exhibits no discharge.  Neck: Neck supple.  Cardiovascular: Normal rate, regular rhythm, normal heart sounds and intact distal pulses.  Exam reveals no gallop and no friction rub.   No murmur heard. Pulmonary/Chest: Effort normal and breath sounds normal. No respiratory distress. She has no wheezes. She has no rales.  Abdominal: Soft. She exhibits no mass. There is no tenderness. There is no rebound and no guarding.  Bowel sounds are hypoactive. Abdomen is soft and non-tender to palpation. No CVA or flank TTP.   Genitourinary:  Genitourinary Comments: Digital rectal exam with female nurse tech chaperone. Patient has large amount of soft stool deep in her rectum. No hard fecal impaction that is able to be digitally disimpacted.   Musculoskeletal: She exhibits no edema.  Lymphadenopathy:    She has no cervical adenopathy.  Neurological: She is alert and oriented to person, place, and time. Coordination normal.  Skin: Skin is warm and dry. Capillary refill takes less than 2 seconds. No rash noted. She is not diaphoretic. No erythema. No pallor.  Psychiatric: She has a normal mood and affect. Her behavior is normal.  Nursing note and vitals reviewed.    ED Treatments / Results  Labs (all labs ordered are listed, but only abnormal results are displayed) Labs Reviewed  COMPREHENSIVE METABOLIC PANEL - Abnormal; Notable for the following:       Result Value   Potassium 3.4 (*)    Glucose, Bld 133 (*)    Calcium 8.8 (*)    Total Protein 6.3 (*)    All other components within normal limits  CBC WITH DIFFERENTIAL/PLATELET - Abnormal; Notable for the following:    Hemoglobin 11.7 (*)    All other components within normal limits  URINALYSIS, ROUTINE W REFLEX MICROSCOPIC - Abnormal; Notable for the following:    Color, Urine STRAW (*)    APPearance CLOUDY (*)    All other  components within normal limits    EKG  EKG Interpretation None       Radiology Ct Abdomen Pelvis W Contrast  Result Date: 05/28/2016 CLINICAL DATA:  Left lower quadrant abdominal pain and constipation. EXAM: CT ABDOMEN AND PELVIS WITH CONTRAST TECHNIQUE: Multidetector CT imaging of the abdomen and pelvis was performed using the standard protocol following bolus administration of intravenous  contrast. CONTRAST:  186m ISOVUE-300 IOPAMIDOL (ISOVUE-300) INJECTION 61% COMPARISON:  CT abdomen pelvis 01/19/2015 FINDINGS: Lower chest: No pulmonary nodules. No visible pleural or pericardial effusion. Hepatobiliary: Normal hepatic size and contours without focal liver lesion. No perihepatic ascites. There is persistent extrahepatic biliary dilatation which has not changed compared to studies as far back as 03/27/2010. The appearance of the gallbladder is unchanged with a small Phrygian cap. Pancreas: Mild pancreatic ductal dilatation is unchanged. No other focal pancreatic abnormality. No peripancreatic fluid collection. Spleen: Normal. Adrenals/Urinary Tract: Normal adrenal glands. No hydronephrosis or solid renal mass. Pelvic left kidney. Stomach/Bowel: There is a large amount of stool within the colon, including a 10 cm stool ball in the rectum. There is no small bowel dilatation. A ventral abdominal hernia containing a small amount of colon is unchanged, without evidence of obstruction or ischemia. There is no abdominal fluid collection. There is a bowel anastomosis in the lower mid abdomen. The colon is redundant. No acute inflammation identified. Trace fluid in the presacral space. Vascular/Lymphatic: There is aortic atherosclerosis of the tortuous abdominal aorta of without aneurysm. No abdominal or pelvic lymphadenopathy. Reproductive: No focal uterine or adnexal abnormality. Musculoskeletal: Multilevel lumbar osteophyte formation and facet arthrosis. Normal visualized extrathoracic and extraperitoneal  soft tissues. Other: No contributory non-categorized findings. IMPRESSION: 1. Large amount of stool in the colon, with a large stool ball within the rectum with surrounding edema extending into the presacral space. This may be secondary to venous congestion, though inflammation, including the possibility of stercoral colitis, would be difficult to exclude. 2. Unchanged extrahepatic biliary dilatation. 3. Aortic atherosclerosis. Electronically Signed   By: KUlyses JarredM.D.   On: 05/28/2016 17:31    Procedures Procedures (including critical care time)  Medications Ordered in ED Medications  sodium chloride 0.9 % bolus 500 mL (0 mLs Intravenous Stopped 05/28/16 1826)  iopamidol (ISOVUE-300) 61 % injection 100 mL (100 mLs Intravenous Contrast Given 05/28/16 1654)  sorbitol, milk of mag, mineral oil, glycerin (SMOG) enema (960 mLs Rectal Given 05/28/16 1857)  dicyclomine (BENTYL) capsule 20 mg (20 mg Oral Given 05/28/16 2045)     Initial Impression / Assessment and Plan / ED Course  I have reviewed the triage vital signs and the nursing notes.  Pertinent labs & imaging results that were available during my care of the patient were reviewed by me and considered in my medical decision making (see chart for details).    This is a 83y.o. Female with a history of a-fib on Eliquis and previous SBO who presents to the ED complaining of abdominal pain and constipation for the past week. Patient reports intermittent sharp generalized abdominal pain for the past 3 days. She reports no BM for the past week and reports feeling bloated and needing to have a BM. She has used milk of magnesia, Colace and today a fleets enema without success or bowel movement.  On examination is afebrile nontoxic appearing. Her abdomen is soft nontender to palpation. On rectal exam she has a large soft fecal ball that is deep in her rectum. No hard fecal impaction that is able to be digitally disimpacted.  CBC and CMP are  unremarkable. CT abdomen and pelvis with contrast shows large amount of stool in the colon with a large stool ball within the rectum. No SBO. Patient tolerated SMOG enema well. She had several large bowel movements. She reports having some abdominal cramping and did well with some Bentyl. At reevaluation she is feeling ready for discharge.  I discussed using MiraLAX twice a day. I discussed using high-fiber diet. I encouraged her to follow-up closely with her primary care doctor. I advised the patient to follow-up with their primary care provider this week. I advised the patient to return to the emergency department with new or worsening symptoms or new concerns. The patient verbalized understanding and agreement with plan.    This patient was discussed with and evaluated by Dr. Vanita Panda who agrees with assessment and plan.   Final Clinical Impressions(s) / ED Diagnoses   Final diagnoses:  Constipation, unspecified constipation type  Fecal impaction in rectum [facility])    New Prescriptions New Prescriptions   DICYCLOMINE (BENTYL) 20 MG TABLET    Take 1 tablet (20 mg total) by mouth 2 (two) times daily as needed (for abdominal cramping).   POLYETHYLENE GLYCOL POWDER (GLYCOLAX/MIRALAX) POWDER    Take 17 g by mouth 2 (two) times daily.     Waynetta Pean, PA-C 05/28/16 2227    Carmin Muskrat, MD 05/29/16 [phone]

## 2016-05-28 NOTE — ED Notes (Signed)
Pt's. daughter, Kennyth Lose called  from [place] to check on patient's  condition. She also concerns about anxiety. Wanted to know if there was anything that can be prescribed.

## 2016-05-29 DIAGNOSIS — G20 Parkinson's disease: Secondary | ICD-10-CM | POA: Diagnosis not present

## 2016-05-29 DIAGNOSIS — F418 Other specified anxiety disorders: Secondary | ICD-10-CM | POA: Diagnosis not present

## 2016-05-29 DIAGNOSIS — R413 Other amnesia: Secondary | ICD-10-CM | POA: Diagnosis not present

## 2016-05-29 DIAGNOSIS — R627 Adult failure to thrive: Secondary | ICD-10-CM | POA: Diagnosis not present

## 2016-05-29 DIAGNOSIS — R2689 Other abnormalities of gait and mobility: Secondary | ICD-10-CM | POA: Diagnosis not present

## 2016-05-29 DIAGNOSIS — M6281 Muscle weakness (generalized): Secondary | ICD-10-CM | POA: Diagnosis not present

## 2016-05-31 DIAGNOSIS — G20 Parkinson's disease: Secondary | ICD-10-CM | POA: Diagnosis not present

## 2016-05-31 DIAGNOSIS — R2689 Other abnormalities of gait and mobility: Secondary | ICD-10-CM | POA: Diagnosis not present

## 2016-05-31 DIAGNOSIS — R627 Adult failure to thrive: Secondary | ICD-10-CM | POA: Diagnosis not present

## 2016-05-31 DIAGNOSIS — R41841 Cognitive communication deficit: Secondary | ICD-10-CM | POA: Diagnosis not present

## 2016-05-31 DIAGNOSIS — M6281 Muscle weakness (generalized): Secondary | ICD-10-CM | POA: Diagnosis not present

## 2016-05-31 DIAGNOSIS — F418 Other specified anxiety disorders: Secondary | ICD-10-CM | POA: Diagnosis not present

## 2016-06-03 DIAGNOSIS — R627 Adult failure to thrive: Secondary | ICD-10-CM | POA: Diagnosis not present

## 2016-06-03 DIAGNOSIS — G20 Parkinson's disease: Secondary | ICD-10-CM | POA: Diagnosis not present

## 2016-06-03 DIAGNOSIS — M6281 Muscle weakness (generalized): Secondary | ICD-10-CM | POA: Diagnosis not present

## 2016-06-03 DIAGNOSIS — R2689 Other abnormalities of gait and mobility: Secondary | ICD-10-CM | POA: Diagnosis not present

## 2016-06-03 DIAGNOSIS — R41841 Cognitive communication deficit: Secondary | ICD-10-CM | POA: Diagnosis not present

## 2016-06-03 DIAGNOSIS — F418 Other specified anxiety disorders: Secondary | ICD-10-CM | POA: Diagnosis not present

## 2016-06-04 ENCOUNTER — Encounter: Payer: Self-pay | Admitting: Internal Medicine

## 2016-06-04 ENCOUNTER — Ambulatory Visit (INDEPENDENT_AMBULATORY_CARE_PROVIDER_SITE_OTHER): Payer: Medicare Other | Admitting: Internal Medicine

## 2016-06-04 VITALS — BP 122/70 | HR 86 | Temp 97.6°F | Resp 12 | Ht 70.0 in | Wt 150.4 lb

## 2016-06-04 DIAGNOSIS — K5900 Constipation, unspecified: Secondary | ICD-10-CM | POA: Diagnosis not present

## 2016-06-04 DIAGNOSIS — F329 Major depressive disorder, single episode, unspecified: Secondary | ICD-10-CM

## 2016-06-04 DIAGNOSIS — F32A Depression, unspecified: Secondary | ICD-10-CM

## 2016-06-04 DIAGNOSIS — F418 Other specified anxiety disorders: Secondary | ICD-10-CM | POA: Diagnosis not present

## 2016-06-04 DIAGNOSIS — F419 Anxiety disorder, unspecified: Secondary | ICD-10-CM

## 2016-06-04 NOTE — Patient Instructions (Signed)
Stop calcium, continue vitamin D 1000 units daily  Stay active   Continue MiraLAX every day  If you are getting constipated, use glycerin suppositories or as needed  Fleet enema.  If you have abdominal pain, fever, chills, nausea, vomiting: Go to the ER again or call the office.

## 2016-06-04 NOTE — Progress Notes (Signed)
Pre visit review using our clinic review tool, if applicable. No additional management support is needed unless otherwise documented below in the visit note. 

## 2016-06-04 NOTE — Progress Notes (Signed)
Subjective:    Patient ID: [name], female    DOB: 31-Dec-1927, 83 y.o.   MRN: [id number]  DOS:  06/04/2016 Type of visit - description : ER follow-up Interval history: Martin Majestic to the ER 05/28/2016, one-week history of abdominal pain and constipation. No relief with MOM, Colace and Fleet enemas. DRE showed abundant stools, CMP-CBC ok,CT abdomen benign. She was de-impacted digitally and got a  SMOG enema. Several large bowel movements, felt better and was discharged home.  Since she left the ER, she is doing well, having BMs daily, on MiraLAX.  Likes to talk about anxiety.  Review of Systems Denies fever, chills. No nausea vomiting    Past Medical History:  Diagnosis Date  . Anxiety   . Arthritis   . Atrial fibrillation ([place])   . Cardiac arrhythmia due to congenital heart disease   . Chronic constipation   . COPD (chronic obstructive pulmonary disease) ([place])   . Depression   . GERD (gastroesophageal reflux disease)   . Hemorrhoid   . Hyperlipidemia   . Idiopathic hypotension   . Lightheadedness 11/2015  . Mycotic toenails 10/27/2012  . Parkinson disease ([place])   . Recurrent UTI   . 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 Varicose veins     Past Surgical History:  Procedure Laterality Date  . APPENDECTOMY  83 years old  . COLON RESECTION  2008  . vitriectomy  08-2015    Social History   Social History  . Marital status: Widowed    Spouse name: N/A  . Number of children: 2  . Years of education: N/A   Occupational History  . retired- Ambulance person , elementary school [employer]  . Smoking status: Former Smoker    Packs/day: 2.00    Years: 30.00    Types: Cigarettes    Quit date: 04/21/1980  . Smokeless tobacco: Never Used     Comment: onset age 56 -48, up to > 1ppd (almost 2 ppd)  . Alcohol use No  . Drug use: No  . Sexual activity: No   Other Topics Concern  . Not on file   Social History Narrative   Widowed, lives  alone. 1 child in [place] and 1 in [place]. 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      Allergies as of 06/04/2016      Reactions   Adhesive [tape] Other (See Comments)   Caused blisters   Amoxicillin Other (See Comments)   Caused thrush   Ciprofloxacin Other (See Comments)   Caused thrush   Ultram [tramadol] Other (See Comments)   hypotension      Medication List       Accurate as of 06/04/16 11:59 PM. Always use your most recent med list.          acetaminophen 500 MG tablet Commonly known as:  TYLENOL Take 500 mg by mouth every 6 (six) hours as needed.   albuterol 108 (90 Base) MCG/ACT inhaler Commonly known as:  VENTOLIN HFA Inhale 2 puffs into the lungs 2 (two) times daily as needed for wheezing or shortness of breath.   apixaban 5 MG Tabs tablet Commonly known as:  ELIQUIS Take 5 mg by mouth 2 (two) times daily.   azelastine 0.1 % nasal spray Commonly known as:  ASTELIN Place 2 sprays into both nostrils at bedtime as needed for rhinitis. Use in each nostril as directed   cholecalciferol 1000  units tablet Commonly known as:  VITAMIN D Take 1,000 Units by mouth daily.   dicyclomine 20 MG tablet Commonly known as:  BENTYL Take 1 tablet (20 mg total) by mouth 2 (two) times daily as needed (for abdominal cramping).   diltiazem 30 MG tablet Commonly known as:  CARDIZEM Take 1 tablet (30 mg total) by mouth 4 (four) times daily. Take at onset on atrial fibrillation   DULoxetine 30 MG capsule Commonly known as:  CYMBALTA Take 1 capsule (30 mg total) by mouth daily.   fludrocortisone 0.1 MG tablet Commonly known as:  FLORINEF Take 0.1 mg by mouth daily.   meclizine 25 MG tablet Commonly known as:  ANTIVERT Take 1 or 2 po Q 6hrs for dizziness   polyethylene glycol powder powder Commonly known as:  GLYCOLAX/MIRALAX Take 17 g by mouth 2 (two) times daily.   SYMBICORT 160-4.5 MCG/ACT inhaler Generic drug:  budesonide-formoterol INHALE 2 PUFFS BY  MOUTH INTO THE LUNGS TWICE DAILY.   SYSTANE BALANCE OP Apply 2 drops to eye 3 (three) times daily as needed.   trimethoprim 100 MG tablet Commonly known as:  TRIMPEX Take 1 tablet (100 mg total) by mouth daily. Reported on 10/03/2015   vitamin B-12 1000 MCG tablet Commonly known as:  CYANOCOBALAMIN Take 1,000 mcg by mouth daily.   vitamin C 1000 MG tablet Take 1,000 mg by mouth daily.          Objective:   Physical Exam BP 122/70 (BP Location: Right Arm, Patient Position: Sitting, Cuff Size: Small)   Pulse 86   Temp 97.6 F (36.4 C) (Oral)   Resp 12   Ht '5\' 10"'$  (1.778 m)   Wt 150 lb 6 oz (68.2 kg)   SpO2 95%   BMI 21.58 kg/m  General:   Well developed, well nourished . NAD.  HEENT:  Normocephalic . Face symmetric, atraumatic Lungs:  CTA B Normal respiratory effort, no intercostal retractions, no accessory muscle use. Heart: irreg,  no murmur.  Abdomen: Soft, good bowel sounds, no TTP. No pretibial edema bilaterally  Skin: Not pale. Not jaundice Neurologic:  alert & oriented X3.  Speech normal, gait appropriate for age and unassisted Psych--  Cognition and judgment appear intact.  Cooperative with normal attention span and concentration.  Behavior appropriate. No anxious or depressed appearing.      Assessment & Plan:    Assessment   A1c 5.7 01-2015 Hyperlipidemia Anxiety depression  Dr Ferdinand Lango (psychology), meds per pcp (see OV note from 02-19-16) Sx onset ~ 2007:  2012, 2013 was on citalopram. Then sertraline. Unable to tolerate Prozac on Wellbutrin 02-2015 (tired, dizzy). Trial with Cymbalta 01-2016. COPD   CV: P-Atrial fibrillation Dr Gerrit Friends, flecanaide prn /cardiazem prn. Echo 11-2015 @ [facility] Low BP- likely d/t parkinson, rx fludrocortisone ~ 11-2015  GI: --GERD --Abnormal abdominal US mild pancreatic duct enlargement  --IBS used to be constipated, now diarrhea predominant --dysphagia MBE 02-22-15 wnl DJD--- take Celebrex very  seldom Recurrent UTIs -- qd abx prn  , has seen urology Dr Edwena Blow before NEURO: ---Parkinsonism ---  Dr Tat ---Gait d/o (multifactorial likely) ---Peripheral neuropathy (pr neuro note 04-2015) ---mild cognitive impairmentairment (see neuro note 04-2015), dx confirmed by neuropsychiatry evaluation 07-2015 ---MBE 02/22/2015 normal admitted  01-2015: COPD and RVR   PLAN   Constipation, fecal impaction: Doing well since the ER visit, CT abdomen negative, CMP with minor abnormalities. Recommend to continue MiraLAX daily, use Fleet Enema or glycerin suppository if needed. ER is alarming symptoms.  See AVS. Also stop calcium suplements, continue vitamin D. Anxiety : Since the last time she was here, was told by the facility she was moving in that she was not a candidate to live there, pt was very upset about it. Wonders if she needs to change duloxetine to one other medication. We had a long conversation about it, she was doing well onduloxetine, she is more anxious likely due to recent issue with the facility. Denies any suicidal ideas. For now we agreed to continue same meds, will see her counselor tomorrow. States her family would like to talk to me, I'll be happy to. Wonders about seeing a psychiatrist, I'm not opposed, she will let me know if/when ready to see one. RTC 4- 2018 F2F> 29

## 2016-06-05 ENCOUNTER — Encounter: Payer: Self-pay | Admitting: Medical

## 2016-06-05 ENCOUNTER — Ambulatory Visit (INDEPENDENT_AMBULATORY_CARE_PROVIDER_SITE_OTHER): Payer: Medicare Other | Admitting: Medical

## 2016-06-05 ENCOUNTER — Ambulatory Visit (INDEPENDENT_AMBULATORY_CARE_PROVIDER_SITE_OTHER): Payer: Medicare Other | Admitting: Psychiatry

## 2016-06-05 ENCOUNTER — Telehealth: Payer: Self-pay | Admitting: Internal Medicine

## 2016-06-05 VITALS — BP 102/66 | HR 99 | Temp 98.4°F | Ht 70.0 in | Wt 151.4 lb

## 2016-06-05 DIAGNOSIS — F411 Generalized anxiety disorder: Secondary | ICD-10-CM

## 2016-06-05 DIAGNOSIS — R319 Hematuria, unspecified: Secondary | ICD-10-CM | POA: Diagnosis not present

## 2016-06-05 DIAGNOSIS — R350 Frequency of micturition: Secondary | ICD-10-CM

## 2016-06-05 DIAGNOSIS — R300 Dysuria: Secondary | ICD-10-CM

## 2016-06-05 LAB — POC URINALSYSI DIPSTICK (AUTOMATED)
BILIRUBIN UA: NEGATIVE
GLUCOSE UA: NEGATIVE
Ketones, UA: NEGATIVE
Leukocytes, UA: NEGATIVE
Nitrite, UA: NEGATIVE
Urobilinogen, UA: NEGATIVE
pH, UA: 6

## 2016-06-05 MED ORDER — SULFAMETHOXAZOLE-TRIMETHOPRIM 800-160 MG PO TABS: 1.0000 | ORAL_TABLET | Freq: Two times a day (BID) | ORAL | 0 refills | Status: DC

## 2016-06-05 NOTE — Progress Notes (Signed)
Subjective:    Patient ID: [name], female    DOB: [birth year], 83 y.o.   MRN: [id number]  HPI  Pt states this am work up and notes burning on urination and some blood in urine. Today she is urinating very frequently. Pt states she felt some chills today. Some faint back pain. Some faint nausea. No vomiting.   Pt in states she has some history of uti in the past. Pt states that in past she was on trimethropin  preventatively in the past. She had last rx of preventative trimethropinn 100 mg tab q night. But months since used in preventative manner.  Pt states that she sat in bed pan and urinated when she was in hospital. She would wait a very long time until staff came to help. She thinks this may have contributed to possible infection.       Review of Systems  Constitutional: Positive for chills. Negative for fatigue.  Respiratory: Negative for cough, chest tightness, shortness of breath and wheezing.   Cardiovascular: Negative for palpitations.  Gastrointestinal: Negative for abdominal pain, constipation, nausea and vomiting.  Genitourinary: Positive for dysuria, frequency and urgency. Negative for difficulty urinating, hematuria, vaginal discharge and vaginal pain.  Musculoskeletal: Positive for back pain.       See hpi. Faint pain.  Skin: Negative for rash.  Neurological: Negative for dizziness and headaches.  Hematological: Negative for adenopathy. Does not bruise/bleed easily.  Psychiatric/Behavioral: Negative for agitation and confusion.    Past Medical History:  Diagnosis Date  . Anxiety   . Arthritis   . Atrial fibrillation ([place])   . Cardiac arrhythmia due to congenital heart disease   . Chronic constipation   . COPD (chronic obstructive pulmonary disease) ([place])   . Depression   . GERD (gastroesophageal reflux disease)   . Hemorrhoid   . Hyperlipidemia   . Idiopathic hypotension   . Lightheadedness 11/2015  . Mycotic toenails 10/27/2012  . Parkinson  disease ([place])   . Recurrent UTI   . 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 Varicose veins      Social History   Social History  . Marital status: Widowed    Spouse name: N/A  . Number of children: 2  . Years of education: N/A   Occupational History  . retired- Ambulance person , elementary school [employer]  . Smoking status: Former Smoker    Packs/day: 2.00    Years: 30.00    Types: Cigarettes    Quit date: 04/21/1980  . Smokeless tobacco: Never Used     Comment: onset age 19 -5, up to > 1ppd (almost 2 ppd)  . Alcohol use No  . Drug use: No  . Sexual activity: No   Other Topics Concern  . Not on file   Social History Narrative   Widowed, lives alone. 1 child in [place] and 1 in [place]. 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    Past Surgical History:  Procedure Laterality Date  . APPENDECTOMY  83 years old  . COLON RESECTION  2008  . vitriectomy  08-2015    Family History  Problem Relation Age of Onset  . Asthma Brother   . Alcohol abuse Brother   . Throat cancer Father   . Alcohol abuse Father   . Lupus Daughter   . Bipolar disorder Daughter   . Lupus Daughter   . Anxiety disorder Daughter   .  Alcohol abuse Sister   . Alcohol abuse Maternal Grandfather   . Alcohol abuse Paternal Grandmother   . Breast cancer Sister   . Colon cancer Neg Hx     Allergies  Allergen Reactions  . Adhesive [Tape] Other (See Comments)    Caused blisters  . Amoxicillin Other (See Comments)    Caused thrush  . Ciprofloxacin Other (See Comments)    Caused thrush  . Ultram [Tramadol] Other (See Comments)    hypotension    Current Outpatient Prescriptions on File Prior to Visit  Medication Sig Dispense Refill  . acetaminophen (TYLENOL) 500 MG tablet Take 500 mg by mouth every 6 (six) hours as needed.    Marland Kitchen albuterol (VENTOLIN HFA) 108 (90 BASE) MCG/ACT inhaler Inhale 2 puffs into the lungs 2 (two) times daily as needed for wheezing or  shortness of breath. 18 g 4  . apixaban (ELIQUIS) 5 MG TABS tablet Take 5 mg by mouth 2 (two) times daily.    . Ascorbic Acid (VITAMIN C) 1000 MG tablet Take 1,000 mg by mouth daily.    Marland Kitchen azelastine (ASTELIN) 0.1 % nasal spray Place 2 sprays into both nostrils at bedtime as needed for rhinitis. Use in each nostril as directed 30 mL 3  . cholecalciferol (VITAMIN D) 1000 units tablet Take 1,000 Units by mouth daily.    Marland Kitchen dicyclomine (BENTYL) 20 MG tablet Take 1 tablet (20 mg total) by mouth 2 (two) times daily as needed (for abdominal cramping). 6 tablet 0  . diltiazem (CARDIZEM) 30 MG tablet Take 1 tablet (30 mg total) by mouth 4 (four) times daily. Take at onset on atrial fibrillation 30 tablet 0  . DULoxetine (CYMBALTA) 30 MG capsule Take 1 capsule (30 mg total) by mouth daily. 30 capsule 3  . fludrocortisone (FLORINEF) 0.1 MG tablet Take 0.1 mg by mouth daily.    . meclizine (ANTIVERT) 25 MG tablet Take 1 or 2 po Q 6hrs for dizziness 40 tablet 0  . polyethylene glycol powder (GLYCOLAX/MIRALAX) powder Take 17 g by mouth 2 (two) times daily. 255 g 0  . Propylene Glycol (SYSTANE BALANCE OP) Apply 2 drops to eye 3 (three) times daily as needed.    . SYMBICORT 160-4.5 MCG/ACT inhaler INHALE 2 PUFFS BY MOUTH INTO THE LUNGS TWICE DAILY. 3 Inhaler 3  . trimethoprim (TRIMPEX) 100 MG tablet Take 1 tablet (100 mg total) by mouth daily. Reported on 10/03/2015 30 tablet 6  . vitamin B-12 (CYANOCOBALAMIN) 1000 MCG tablet Take 1,000 mcg by mouth daily.     No current facility-administered medications on file prior to visit.     BP 102/66   Pulse 99   Temp 98.4 F (36.9 C) (Oral)   Ht '5\' 10"'$  (1.778 m)   Wt 151 lb 6.4 oz (68.7 kg)   SpO2 98%   BMI 21.72 kg/m       Objective:   Physical Exam  General Appearance- Not in acute distress.  HEENT Eyes- Scleraeral/Conjuntiva-bilat- Not Yellow. Mouth & Throat- Normal.  Chest and Lung Exam Auscultation: Breath sounds:-Normal. Adventitious sounds:-  No Adventitious sounds.  Cardiovascular Auscultation:Rythm - Regular. Heart Sounds -Normal heart sounds.  Abdomen Inspection:-Inspection Normal.  Palpation/Perucssion: Palpation and Percussion of the abdomen reveal- Non Tender/no suprapubic tenderness, No Rebound tenderness, No rigidity(Guarding) and No Palpable abdominal masses.  Liver:-Normal.  Spleen:- Normal.   Back- faint  Bilateral cva tenderness(very minimal)      Assessment & Plan:  You appear to have a urinary tract infection.  I am prescribing bactrim  antibiotic for the probable infection. Hydrate well. I am sending out a urine culture. During the interim if your signs and symptoms worsen rather than improving please notify us. We will notify your when the culture results are back. If symptoms worsen severely  over weekend then ED evaluation. Choice of antibiotic  limited  due to your age and side effect history. But bactrim can treat uti effectively.  Follow up Monday or as needed

## 2016-06-05 NOTE — Telephone Encounter (Signed)
Patient Name: IYSHA MISHKIN DOB: 1928/02/26 Initial Comment CBWN: Caller statesit has been over an hour and no call back Caller says, saw the Dr yesterday for a follow up call, today she has diarrhea and bleeding w/ urination Nurse Assessment Nurse: Ronnald Ramp, RN, Miranda Date/Time (Eastern Time): 06/05/2016 2:18:01 PM Confirm and document reason for call. If symptomatic, describe symptoms. ---Caller states today she has blood in her urine today. Also with fatigue. Does the patient have any new or worsening symptoms? ---Yes Will a triage be completed? ---Yes Related visit to physician within the last 2 weeks? ---No Does the PT have any chronic conditions? (i.e. diabetes, asthma, etc.) ---Yes List chronic conditions. ---A-fib, anxiety Is this a behavioral health or substance abuse call? ---No Guidelines Guideline Title Affirmed Question Affirmed Notes Urine - Blood In Taking Coumadin (warfarin) or other strong blood thinner, or known bleeding disorder (e.g., thrombocytopenia) Final Disposition User See Physician within 4 Hours (or PCP triage) Ronnald Ramp, RN, Miranda Comments Appt scheduled for today at 5:15 with Mackie Pai PA Referrals REFERRED TO PCP OFFICE Disagree/Comply: Leta Baptist

## 2016-06-05 NOTE — Patient Instructions (Addendum)
You appear to have a urinary tract infection. I am prescribing bactrim  antibiotic for the probable infection. Hydrate well. I am sending out a urine culture. During the interim if your signs and symptoms worsen rather than improving please notify us. We will notify your when the culture results are back. If symptoms worsen severely over weekend then ED evaluation. Choice of antibiotic limited due to your age and side effect history. But bactrim can treat uti effectively.  Follow up Monday or as needed

## 2016-06-05 NOTE — Assessment & Plan Note (Signed)
Constipation, fecal impaction: Doing well since the ER visit, CT abdomen negative, CMP with minor abnormalities. Recommend to continue MiraLAX daily, use Fleet Enema or glycerin suppository if needed. ER is alarming symptoms. See AVS. Also stop calcium suplements, continue vitamin D. Anxiety : Since the last time she was here, was told by the facility she was moving in that she was not a candidate to live there, pt was very upset about it. Wonders if she needs to change duloxetine to one other medication. We had a long conversation about it, she was doing well onduloxetine, she is more anxious likely due to recent issue with the facility. Denies any suicidal ideas. For now we agreed to continue same meds, will see her counselor tomorrow. States her family would like to talk to me, I'll be happy to. Wonders about seeing a psychiatrist, I'm not opposed, she will let me know if/when ready to see one. RTC 4- 2018

## 2016-06-05 NOTE — Progress Notes (Signed)
Pre visit review using our clinic review tool, if applicable. No additional management support is needed unless otherwise documented below in the visit note. 

## 2016-06-08 LAB — URINE CULTURE

## 2016-06-09 ENCOUNTER — Ambulatory Visit (INDEPENDENT_AMBULATORY_CARE_PROVIDER_SITE_OTHER): Payer: Medicare Other | Admitting: Medical

## 2016-06-09 VITALS — BP 138/82 | HR 88 | Temp 97.7°F | Ht 70.0 in | Wt 153.4 lb

## 2016-06-09 DIAGNOSIS — R319 Hematuria, unspecified: Secondary | ICD-10-CM | POA: Diagnosis not present

## 2016-06-09 DIAGNOSIS — R739 Hyperglycemia, unspecified: Secondary | ICD-10-CM

## 2016-06-09 NOTE — Patient Instructions (Addendum)
You appear to be getting over uti. Culture showed sensitivity to bactrim and symptoms have resolved.  Hydrate well.  Follow up in 7 days but just for a UA to see if blood has resolved post treatment. Follow up otherwise as needed

## 2016-06-09 NOTE — Progress Notes (Signed)
Pre visit review using our clinic tool,if applicable. No additional management support is needed unless otherwise documented below in the visit note.  

## 2016-06-09 NOTE — Progress Notes (Signed)
Subjective:    Patient ID: [name], female    DOB: May 21, 1927, 83 y.o.   MRN: [id number]  HPI  Pt in for follow up for likely infection(saw her last wee). I did culture on her urine and want her to come back in since she had some rt side cva area pain on last visit.  Pt no longer having pain on urination. No fever, no chills or sweats. No gross blood in her urine. No back pain.  Cuture showed klebsiella and senstive to bactrim.   Review of Systems  Constitutional: Negative for chills, fatigue and fever.  Respiratory: Negative for cough, chest tightness, shortness of breath and wheezing.   Cardiovascular: Negative for chest pain and palpitations.  Gastrointestinal: Negative for abdominal pain, diarrhea, nausea, rectal pain and vomiting.  Genitourinary: Negative for decreased urine volume, dyspareunia, enuresis, flank pain, frequency, hematuria, pelvic pain and urgency.  Musculoskeletal: Negative for back pain and gait problem.  Neurological: Negative for dizziness, weakness and headaches.  Hematological: Negative for adenopathy. Does not bruise/bleed easily.  Psychiatric/Behavioral: Negative for confusion and decreased concentration.   Past Medical History:  Diagnosis Date  . Anxiety   . Arthritis   . Atrial fibrillation ([place])   . Cardiac arrhythmia due to congenital heart disease   . Chronic constipation   . COPD (chronic obstructive pulmonary disease) ([place])   . Depression   . GERD (gastroesophageal reflux disease)   . Hemorrhoid   . Hyperlipidemia   . Idiopathic hypotension   . Lightheadedness 11/2015  . Mycotic toenails 10/27/2012  . Parkinson disease ([place])   . Recurrent UTI   . 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 Varicose veins      Social History   Social History  . Marital status: Widowed    Spouse name: N/A  . Number of children: 2  . Years of education: N/A   Occupational History  . retired- Ambulance person , elementary school [employer]  . Smoking status: Former Smoker    Packs/day: 2.00    Years: 30.00    Types: Cigarettes    Quit date: 04/21/1980  . Smokeless tobacco: Never Used     Comment: onset age 57 -58, up to > 1ppd (almost 2 ppd)  . Alcohol use No  . Drug use: No  . Sexual activity: No   Other Topics Concern  . Not on file   Social History Narrative   Widowed, lives alone. 1 child in [place] and 1 in [place]. 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    Past Surgical History:  Procedure Laterality Date  . APPENDECTOMY  83 years old  . COLON RESECTION  2008  . vitriectomy  08-2015    Family History  Problem Relation Age of Onset  . Asthma Brother   . Alcohol abuse Brother   . Throat cancer Father   . Alcohol abuse Father   . Lupus Daughter   . Bipolar disorder Daughter   . Lupus Daughter   . Anxiety disorder Daughter   . Alcohol abuse Sister   . Alcohol abuse Maternal Grandfather   . Alcohol abuse Paternal Grandmother   . Breast cancer Sister   . Colon cancer Neg Hx     Allergies  Allergen Reactions  . Adhesive [Tape] Other (See Comments)    Caused blisters  . Amoxicillin Other (See Comments)    Caused thrush  . Ciprofloxacin Other (See  Comments)    Caused thrush  . Ultram [Tramadol] Other (See Comments)    hypotension    Current Outpatient Prescriptions on File Prior to Visit  Medication Sig Dispense Refill  . acetaminophen (TYLENOL) 500 MG tablet Take 500 mg by mouth every 6 (six) hours as needed.    Marland Kitchen albuterol (VENTOLIN HFA) 108 (90 BASE) MCG/ACT inhaler Inhale 2 puffs into the lungs 2 (two) times daily as needed for wheezing or shortness of breath. 18 g 4  . apixaban (ELIQUIS) 5 MG TABS tablet Take 5 mg by mouth 2 (two) times daily.    . Ascorbic Acid (VITAMIN C) 1000 MG tablet Take 1,000 mg by mouth daily.    Marland Kitchen azelastine (ASTELIN) 0.1 % nasal spray Place 2 sprays into both nostrils at bedtime as needed for rhinitis. Use in each nostril as  directed 30 mL 3  . cholecalciferol (VITAMIN D) 1000 units tablet Take 1,000 Units by mouth daily.    Marland Kitchen dicyclomine (BENTYL) 20 MG tablet Take 1 tablet (20 mg total) by mouth 2 (two) times daily as needed (for abdominal cramping). 6 tablet 0  . diltiazem (CARDIZEM) 30 MG tablet Take 1 tablet (30 mg total) by mouth 4 (four) times daily. Take at onset on atrial fibrillation 30 tablet 0  . DULoxetine (CYMBALTA) 30 MG capsule Take 1 capsule (30 mg total) by mouth daily. 30 capsule 3  . fludrocortisone (FLORINEF) 0.1 MG tablet Take 0.1 mg by mouth daily.    . meclizine (ANTIVERT) 25 MG tablet Take 1 or 2 po Q 6hrs for dizziness 40 tablet 0  . polyethylene glycol powder (GLYCOLAX/MIRALAX) powder Take 17 g by mouth 2 (two) times daily. 255 g 0  . Propylene Glycol (SYSTANE BALANCE OP) Apply 2 drops to eye 3 (three) times daily as needed.    . sulfamethoxazole-trimethoprim (BACTRIM DS) 800-160 MG tablet Take 1 tablet by mouth 2 (two) times daily. 14 tablet 0  . sulfamethoxazole-trimethoprim (BACTRIM DS) 800-160 MG tablet Take 1 tablet by mouth 2 (two) times daily. 14 tablet 0  . SYMBICORT 160-4.5 MCG/ACT inhaler INHALE 2 PUFFS BY MOUTH INTO THE LUNGS TWICE DAILY. 3 Inhaler 3  . trimethoprim (TRIMPEX) 100 MG tablet Take 1 tablet (100 mg total) by mouth daily. Reported on 10/03/2015 30 tablet 6  . vitamin B-12 (CYANOCOBALAMIN) 1000 MCG tablet Take 1,000 mcg by mouth daily.     No current facility-administered medications on file prior to visit.     BP 138/82   Pulse 88   Temp 97.7 F (36.5 C) (Oral)   Ht '5\' 10"'$  (1.778 m)   Wt 153 lb 6.4 oz (69.6 kg)   SpO2 100%   BMI 22.01 kg/m       Objective:   Physical Exam   General Appearance- Not in acute distress.  HEENT Eyes- Scleraeral/Conjuntiva-bilat- Not Yellow. Mouth & Throat- Normal.  Chest and Lung Exam Auscultation: Breath sounds:-Normal. Adventitious sounds:- No Adventitious sounds.  Cardiovascular Auscultation:Rythm - Regular.  Heart Sounds -Normal heart sounds.  Abdomen Inspection:-Inspection Normal.  Palpation/Perucssion: Palpation and Percussion of the abdomen reveal- Non Tender, No Rebound tenderness, No rigidity(Guarding) and No Palpable abdominal masses.  Liver:-Normal.  Spleen:- Normal.   Back- no cva tenderness.     Assessment & Plan:  You appear to be getting over uti. Culture showed sensitivity to bactrim and symptoms have resolved.  Hydrate well.  Follow up in 7 days but just for a UA to see if blood has resolved post treatment. Follow  up otherwise as needed  At end of exam ordered future  a1c. I saw at end of exam mild sugar elevation.    Austina Constantin, Percell Miller, PA-C

## 2016-06-12 ENCOUNTER — Telehealth: Payer: Self-pay | Admitting: Medical

## 2016-06-12 DIAGNOSIS — R2689 Other abnormalities of gait and mobility: Secondary | ICD-10-CM | POA: Diagnosis not present

## 2016-06-12 DIAGNOSIS — M6281 Muscle weakness (generalized): Secondary | ICD-10-CM | POA: Diagnosis not present

## 2016-06-12 DIAGNOSIS — F418 Other specified anxiety disorders: Secondary | ICD-10-CM | POA: Diagnosis not present

## 2016-06-12 DIAGNOSIS — R41841 Cognitive communication deficit: Secondary | ICD-10-CM | POA: Diagnosis not present

## 2016-06-12 DIAGNOSIS — G20 Parkinson's disease: Secondary | ICD-10-CM | POA: Diagnosis not present

## 2016-06-12 DIAGNOSIS — R627 Adult failure to thrive: Secondary | ICD-10-CM | POA: Diagnosis not present

## 2016-06-12 NOTE — Telephone Encounter (Signed)
That extra bactrim ds rx was by accident. I believe I asked MA working with me to cancel the mail order rx. Sorry for the extra rx. One 7 day course should be adequate. Rx she got mail order might be good for a year. Hopefully she won't have to use but might want to keep on hand for future use.

## 2016-06-12 NOTE — Telephone Encounter (Signed)
Patient informed of Provider instructions. She appreciated the prompt call back and states provider did a wonderful job caring for her.

## 2016-06-12 NOTE — Telephone Encounter (Signed)
Caller name: [name]  Relation to pt: self  Call back number: [phone] Pharmacy:  Reason for call: Pt states was seen with provider [place] for a urine infection on 06-05-2016 and she was given a new rx of sulfamethoxazole-trimethoprim (BACTRIM DS) 800-160 MG tablet, pt states got rx at [facility] on that same day, but today pt states received a new rx from [facility] by mail for the same rx sulfamethoxazole-trimethoprim, pt wanted to know if this was by mistake or does she have to take this med also. Please advise.

## 2016-06-18 ENCOUNTER — Telehealth: Payer: Self-pay | Admitting: Internal Medicine

## 2016-06-18 NOTE — Telephone Encounter (Signed)
Just FYI.

## 2016-06-18 NOTE — Telephone Encounter (Signed)
Noted  

## 2016-06-18 NOTE — Telephone Encounter (Signed)
Caller name: Lovey Newcomer  Relation to pt: speech Land from [facility]  Call back number: [phone]  Reason for call:  Patient denied this week appointment stating she's "not up to it", as per speech language pathologist 3 more visit are left and wanted to make PCP aware.

## 2016-06-19 ENCOUNTER — Other Ambulatory Visit (INDEPENDENT_AMBULATORY_CARE_PROVIDER_SITE_OTHER): Payer: Medicare Other

## 2016-06-19 ENCOUNTER — Ambulatory Visit (INDEPENDENT_AMBULATORY_CARE_PROVIDER_SITE_OTHER): Payer: Medicare Other | Admitting: Psychiatry

## 2016-06-19 DIAGNOSIS — F411 Generalized anxiety disorder: Secondary | ICD-10-CM | POA: Diagnosis not present

## 2016-06-19 DIAGNOSIS — R739 Hyperglycemia, unspecified: Secondary | ICD-10-CM | POA: Diagnosis not present

## 2016-06-19 DIAGNOSIS — R319 Hematuria, unspecified: Secondary | ICD-10-CM | POA: Diagnosis not present

## 2016-06-19 LAB — POC URINALSYSI DIPSTICK (AUTOMATED)
BILIRUBIN UA: NEGATIVE
Blood, UA: NEGATIVE
GLUCOSE UA: NEGATIVE
Ketones, UA: NEGATIVE
LEUKOCYTES UA: NEGATIVE
NITRITE UA: NEGATIVE
PH UA: 6
Protein, UA: NEGATIVE
Spec Grav, UA: 1.02
Urobilinogen, UA: NEGATIVE

## 2016-06-19 LAB — HEMOGLOBIN A1C: HEMOGLOBIN A1C: 5.8 % (ref 4.6–6.5)

## 2016-06-23 DIAGNOSIS — G20 Parkinson's disease: Secondary | ICD-10-CM | POA: Diagnosis not present

## 2016-06-23 DIAGNOSIS — R627 Adult failure to thrive: Secondary | ICD-10-CM | POA: Diagnosis not present

## 2016-06-23 DIAGNOSIS — F418 Other specified anxiety disorders: Secondary | ICD-10-CM | POA: Diagnosis not present

## 2016-06-23 DIAGNOSIS — R2689 Other abnormalities of gait and mobility: Secondary | ICD-10-CM | POA: Diagnosis not present

## 2016-06-23 DIAGNOSIS — M6281 Muscle weakness (generalized): Secondary | ICD-10-CM | POA: Diagnosis not present

## 2016-06-23 DIAGNOSIS — R41841 Cognitive communication deficit: Secondary | ICD-10-CM | POA: Diagnosis not present

## 2016-06-25 ENCOUNTER — Encounter: Payer: Self-pay | Admitting: Podiatry

## 2016-06-25 ENCOUNTER — Telehealth: Payer: Self-pay

## 2016-06-25 ENCOUNTER — Ambulatory Visit (INDEPENDENT_AMBULATORY_CARE_PROVIDER_SITE_OTHER): Payer: Medicare Other | Admitting: Podiatry

## 2016-06-25 VITALS — Ht 70.0 in | Wt 153.0 lb

## 2016-06-25 DIAGNOSIS — B351 Tinea unguium: Secondary | ICD-10-CM

## 2016-06-25 DIAGNOSIS — M79676 Pain in unspecified toe(s): Secondary | ICD-10-CM

## 2016-06-25 NOTE — Progress Notes (Signed)
Patient ID: [name], female   DOB: 09/14/1927, 83 y.o.   MRN: [id number] Complaint:  Visit Type: Patient returns to my office for continued preventative foot care services. Complaint: Patient states" my nails have grown long and thick and become painful to walk and wear shoes" . She presents for preventative foot care services. No changes to ROS.  She says she has been hospitalized with atrial fib and asthma.  Podiatric Exam: Vascular: dorsalis pedis and posterior tibial pulses are palpable bilateral. Capillary return is immediate. Temperature gradient is WNL. Skin turgor WNL  Sensorium: Normal Semmes Weinstein monofilament test. Normal tactile sensation bilaterally. Nail Exam: Pt has thick disfigured discolored nails with subungual debris noted bilateral entire nail hallux through fifth toenails Ulcer Exam: There is no evidence of ulcer or pre-ulcerative changes or infection. Orthopedic Exam: Muscle tone and strength are WNL. No limitations in general ROM. No crepitus or effusions noted. Foot type and digits show no abnormalities. Bony prominences are unremarkable. Skin: No Porokeratosis. No infection or ulcers  Diagnosis:  Tinea unguium, Pain in right toe, pain in left toes  Treatment & Plan Procedures and Treatment: Consent by patient was obtained for treatment procedures. The patient understood the discussion of treatment and procedures well. All questions were answered thoroughly reviewed. Debridement of mycotic and hypertrophic toenails, 1 through 5 bilateral and clearing of subungual debris. No ulceration, no infection noted.  Return Visit-Office Procedure: Patient instructed to return to the office for a follow up visit 3 months for continued evaluation and treatment.   Irl Bodie DPM 

## 2016-06-25 NOTE — Telephone Encounter (Signed)
Home Health Plan of Care received, reviewed, signed and faxed back to [facility] at [phone]. Form sent for scanning.

## 2016-07-10 DIAGNOSIS — G20 Parkinson's disease: Secondary | ICD-10-CM | POA: Diagnosis not present

## 2016-07-10 DIAGNOSIS — R627 Adult failure to thrive: Secondary | ICD-10-CM | POA: Diagnosis not present

## 2016-07-10 DIAGNOSIS — R2689 Other abnormalities of gait and mobility: Secondary | ICD-10-CM | POA: Diagnosis not present

## 2016-07-10 DIAGNOSIS — R41841 Cognitive communication deficit: Secondary | ICD-10-CM | POA: Diagnosis not present

## 2016-07-10 DIAGNOSIS — M6281 Muscle weakness (generalized): Secondary | ICD-10-CM | POA: Diagnosis not present

## 2016-07-10 DIAGNOSIS — F418 Other specified anxiety disorders: Secondary | ICD-10-CM | POA: Diagnosis not present

## 2016-07-14 ENCOUNTER — Encounter: Payer: Self-pay | Admitting: Medical

## 2016-07-14 ENCOUNTER — Ambulatory Visit (INDEPENDENT_AMBULATORY_CARE_PROVIDER_SITE_OTHER): Payer: Medicare Other | Admitting: Medical

## 2016-07-14 VITALS — BP 131/90 | HR 86 | Temp 98.4°F | Ht 70.0 in | Wt 149.6 lb

## 2016-07-14 DIAGNOSIS — R05 Cough: Secondary | ICD-10-CM | POA: Diagnosis not present

## 2016-07-14 DIAGNOSIS — J0100 Acute maxillary sinusitis, unspecified: Secondary | ICD-10-CM | POA: Diagnosis not present

## 2016-07-14 DIAGNOSIS — J301 Allergic rhinitis due to pollen: Secondary | ICD-10-CM | POA: Diagnosis not present

## 2016-07-14 DIAGNOSIS — R062 Wheezing: Secondary | ICD-10-CM

## 2016-07-14 DIAGNOSIS — R059 Cough, unspecified: Secondary | ICD-10-CM

## 2016-07-14 DIAGNOSIS — J40 Bronchitis, not specified as acute or chronic: Secondary | ICD-10-CM

## 2016-07-14 MED ORDER — AZITHROMYCIN 250 MG PO TABS: ORAL_TABLET | ORAL | 0 refills | Status: DC

## 2016-07-14 MED ORDER — FLUTICASONE PROPIONATE 50 MCG/ACT NA SUSP: 2.0000 | Freq: Every day | NASAL | 1 refills | Status: DC

## 2016-07-14 MED ORDER — BENZONATATE 100 MG PO CAPS: 100.0000 mg | ORAL_CAPSULE | Freq: Three times a day (TID) | ORAL | 0 refills | Status: DC | PRN

## 2016-07-14 MED ORDER — AZELASTINE HCL 0.1 % NA SOLN: 2.0000 | Freq: Every evening | NASAL | 3 refills | Status: AC | PRN

## 2016-07-14 NOTE — Progress Notes (Signed)
Pre visit review using our clinic tool,if applicable. No additional management support is needed unless otherwise documented below in the visit note.  

## 2016-07-14 NOTE — Progress Notes (Signed)
Subjective:    Patient ID: [name], female    DOB: 01-Apr-1928, 83 y.o.   MRN: [id number]  HPI  Pt in with nasal congestion for 10 days. Some sinus pressure and chest congestion. Pt has maxillary sinus pressure. Occasional wheeze. No fever, no chills or sweats. No body aches.   Pt admits she has not had great of appetite. Sometimes does not eat much. She is aware should keep hydrated.    Review of Systems  Constitutional: Negative for chills and fatigue.  HENT: Positive for congestion, postnasal drip, sinus pain and sinus pressure. Negative for ear pain.   Respiratory: Positive for cough and wheezing. Negative for chest tightness and shortness of breath.        Occasional rare wheeze.  Cardiovascular: Negative for chest pain and palpitations.  Gastrointestinal: Negative for abdominal pain.  Musculoskeletal: Negative for back pain.  Neurological: Negative for dizziness and headaches.  Hematological: Negative for adenopathy. Does not bruise/bleed easily.  Psychiatric/Behavioral: Negative for behavioral problems and confusion.   Past Medical History:  Diagnosis Date  . Anxiety   . Arthritis   . Atrial fibrillation ([place])   . Cardiac arrhythmia due to congenital heart disease   . Chronic constipation   . COPD (chronic obstructive pulmonary disease) ([place])   . Depression   . GERD (gastroesophageal reflux disease)   . Hemorrhoid   . Hyperlipidemia   . Idiopathic hypotension   . Lightheadedness 11/2015  . Mycotic toenails 10/27/2012  . Parkinson disease ([place])   . Recurrent UTI   . 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 Varicose veins      Social History   Social History  . Marital status: Widowed    Spouse name: N/A  . Number of children: 2  . Years of education: N/A   Occupational History  . retired- Ambulance person , elementary school [employer]  . Smoking status: Former Smoker    Packs/day: 2.00    Years: 30.00    Types:  Cigarettes    Quit date: 04/21/1980  . Smokeless tobacco: Never Used     Comment: onset age 55 -18, up to > 1ppd (almost 2 ppd)  . Alcohol use No  . Drug use: No  . Sexual activity: No   Other Topics Concern  . Not on file   Social History Narrative   Widowed, lives alone. 1 child in [place] and 1 in [place]. 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    Past Surgical History:  Procedure Laterality Date  . APPENDECTOMY  83 years old  . COLON RESECTION  2008  . vitriectomy  08-2015    Family History  Problem Relation Age of Onset  . Asthma Brother   . Alcohol abuse Brother   . Throat cancer Father   . Alcohol abuse Father   . Lupus Daughter   . Bipolar disorder Daughter   . Lupus Daughter   . Anxiety disorder Daughter   . Alcohol abuse Sister   . Alcohol abuse Maternal Grandfather   . Alcohol abuse Paternal Grandmother   . Breast cancer Sister   . Colon cancer Neg Hx     Allergies  Allergen Reactions  . Adhesive [Tape] Other (See Comments)    Caused blisters  . Amoxicillin Other (See Comments)    Caused thrush  . Ciprofloxacin Other (See Comments)    Caused thrush  . Ultram [Tramadol] Other (See Comments)  hypotension    Current Outpatient Prescriptions on File Prior to Visit  Medication Sig Dispense Refill  . acetaminophen (TYLENOL) 500 MG tablet Take 500 mg by mouth every 6 (six) hours as needed.    Marland Kitchen albuterol (VENTOLIN HFA) 108 (90 BASE) MCG/ACT inhaler Inhale 2 puffs into the lungs 2 (two) times daily as needed for wheezing or shortness of breath. 18 g 4  . apixaban (ELIQUIS) 5 MG TABS tablet Take 5 mg by mouth 2 (two) times daily.    . Ascorbic Acid (VITAMIN C) 1000 MG tablet Take 1,000 mg by mouth daily.    Marland Kitchen azelastine (ASTELIN) 0.1 % nasal spray Place 2 sprays into both nostrils at bedtime as needed for rhinitis. Use in each nostril as directed 30 mL 3  . cholecalciferol (VITAMIN D) 1000 units tablet Take 1,000 Units by mouth daily.    Marland Kitchen diltiazem  (CARDIZEM) 30 MG tablet Take 1 tablet (30 mg total) by mouth 4 (four) times daily. Take at onset on atrial fibrillation 30 tablet 0  . DULoxetine (CYMBALTA) 30 MG capsule Take 1 capsule (30 mg total) by mouth daily. 30 capsule 3  . fludrocortisone (FLORINEF) 0.1 MG tablet Take 0.1 mg by mouth daily.    . meclizine (ANTIVERT) 25 MG tablet Take 1 or 2 po Q 6hrs for dizziness 40 tablet 0  . polyethylene glycol powder (GLYCOLAX/MIRALAX) powder Take 17 g by mouth 2 (two) times daily. 255 g 0  . Propylene Glycol (SYSTANE BALANCE OP) Apply 2 drops to eye 3 (three) times daily as needed.    . sulfamethoxazole-trimethoprim (BACTRIM DS) 800-160 MG tablet Take 1 tablet by mouth 2 (two) times daily. 14 tablet 0  . sulfamethoxazole-trimethoprim (BACTRIM DS) 800-160 MG tablet Take 1 tablet by mouth 2 (two) times daily. 14 tablet 0  . SYMBICORT 160-4.5 MCG/ACT inhaler INHALE 2 PUFFS BY MOUTH INTO THE LUNGS TWICE DAILY. 3 Inhaler 3  . trimethoprim (TRIMPEX) 100 MG tablet Take 1 tablet (100 mg total) by mouth daily. Reported on 10/03/2015 30 tablet 6  . vitamin B-12 (CYANOCOBALAMIN) 1000 MCG tablet Take 1,000 mcg by mouth daily.     No current facility-administered medications on file prior to visit.     BP 131/90   Pulse 86   Temp 98.4 F (36.9 C) (Oral)   Ht '5\' 10"'$  (1.778 m)   Wt 149 lb 9.6 oz (67.9 kg)   SpO2 97%   BMI 21.47 kg/m       Objective:   Physical Exam  General  Mental Status - Alert. General Appearance - Well groomed. Not in acute distress.  Skin Rashes- No Rashes.  HEENT Head- Normal. Ear Auditory Canal - Left- Normal. Right - Normal.Tympanic Membrane- Left- Normal. Right- Normal. Eye Sclera/Conjunctiva- Left- Normal. Right- Normal. Nose & Sinuses Nasal Mucosa- Left-  Boggy and Congested. Right-  Boggy and  Congested.Bilateral maxillary and frontal sinus pressure. Mouth & Throat Lips: Upper Lip- Normal: no dryness, cracking, pallor, cyanosis, or vesicular eruption. Lower  Lip-Normal: no dryness, cracking, pallor, cyanosis or vesicular eruption. Buccal Mucosa- Bilateral- No Aphthous ulcers. Oropharynx- No Discharge or Erythema. Tonsils: Characteristics- Bilateral- No Erythema or Congestion. Size/Enlargement- Bilateral- No enlargement. Discharge- bilateral-None.  Neck Neck- Supple. No Masses.   Chest and Lung Exam Auscultation: Breath Sounds:-Clear even and unlabored.  Cardiovascular Auscultation:Rythm- Regular, rate and rhythm. Murmurs & Other Heart Sounds:Ausculatation of the heart reveal- No Murmurs.  Lymphatic Head & Neck General Head & Neck Lymphatics: Bilateral: Description- No Localized lymphadenopathy.  Assessment & Plan:   You appear to have bronchitis and sinusitis. Rest hydrate and tylenol for fever. I am prescribing cough medicine benzonatate, and azithromcyin antibiotic. For your nasal congestion flonase rx and in one week add astelin.  You should gradually get better. If not then notify us and would recommend a chest xray.  For wheezing advise using your symbicort daily and albuterol as needed basis.  Follow up in 7-10 days or as needed  Emoree Sasaki, Percell Miller, [employer]

## 2016-07-14 NOTE — Patient Instructions (Addendum)
You appear to have bronchitis and sinusitis. Rest hydrate and tylenol for fever. I am prescribing cough medicine benzonatate, and azithromcyin antibiotic. For your nasal congestion flonase rx and in one week add astelin.  You should gradually get better. If not then notify us and would recommend a chest xray.  For wheezing advise using your symbicort daily and  Albuterol as needed basis.   Follow up in 7-10 days or as needed

## 2016-07-15 ENCOUNTER — Ambulatory Visit: Payer: Medicare Other | Admitting: Psychiatry

## 2016-07-23 ENCOUNTER — Ambulatory Visit (HOSPITAL_BASED_OUTPATIENT_CLINIC_OR_DEPARTMENT_OTHER)
Admission: RE | Admit: 2016-07-23 | Discharge: 2016-07-23 | Disposition: A | Discharge status: Home / Self Care | Payer: Medicare Other | Source: Ambulatory Visit | Attending: Internal Medicine | Admitting: Internal Medicine

## 2016-07-23 ENCOUNTER — Encounter: Payer: Self-pay | Admitting: Internal Medicine

## 2016-07-23 ENCOUNTER — Ambulatory Visit (INDEPENDENT_AMBULATORY_CARE_PROVIDER_SITE_OTHER): Payer: Medicare Other | Admitting: Internal Medicine

## 2016-07-23 VITALS — BP 124/80 | HR 76 | Temp 97.7°F | Resp 14 | Ht 70.0 in | Wt 152.4 lb

## 2016-07-23 DIAGNOSIS — L989 Disorder of the skin and subcutaneous tissue, unspecified: Secondary | ICD-10-CM

## 2016-07-23 DIAGNOSIS — R079 Chest pain, unspecified: Secondary | ICD-10-CM | POA: Insufficient documentation

## 2016-07-23 NOTE — Progress Notes (Signed)
Subjective:    Patient ID: [name], female    DOB: Sep 04, 1927, 83 y.o.   MRN: [id number]  DOS:  07/23/2016 Type of visit - description : f/u, CC: CP Interval history: Patient was seen 07/14/2016 with 10 days history of respiratory illness, DX bronchitis, prescribed a Z-Pak and a cough suppressant. She also has inhalers at home. She improved relatively quickly and is feeling better. He is here today because the last 3 or 4 days has woke up with chest pain, described as a heaviness in the middle of the chest, feels that radiates to both shoulders, not associated diaphoresis or nausea. She can't  tell if the pain is associated with moving her torso but she thinks that deep breaths make it worse. Symptoms usually last 20 minutes and then the rest of the day she is asymptomatic. No chest pain with ADLs.  Request a dermatology referral  Wt Readings from Last 3 Encounters:  07/23/16 152 lb 6 oz (69.1 kg)  07/14/16 149 lb 9.6 oz (67.9 kg)  06/25/16 153 lb (69.4 kg)     Review of Systems Denies fever chills No difficulty breathing No GERD type of symptoms. At this point cough is minimal and very little sputum. No hemoptysis. Not using her inhalers consistently.  Past Medical History:  Diagnosis Date  . Anxiety   . Arthritis   . Atrial fibrillation ([place])   . Cardiac arrhythmia due to congenital heart disease   . Chronic constipation   . COPD (chronic obstructive pulmonary disease) ([place])   . Depression   . GERD (gastroesophageal reflux disease)   . Hemorrhoid   . Hyperlipidemia   . Idiopathic hypotension   . Lightheadedness 11/2015  . Mycotic toenails 10/27/2012  . Parkinson disease ([place])   . Recurrent UTI   . 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 Varicose veins     Past Surgical History:  Procedure Laterality Date  . APPENDECTOMY  83 years old  . COLON RESECTION  2008  . vitriectomy  08-2015    Social History   Social History  . Marital status: Widowed   Spouse name: N/A  . Number of children: 2  . Years of education: N/A   Occupational History  . retired- Ambulance person , elementary school [employer]  . Smoking status: Former Smoker    Packs/day: 2.00    Years: 30.00    Types: Cigarettes    Quit date: 04/21/1980  . Smokeless tobacco: Never Used     Comment: onset age 42 -65, up to > 1ppd (almost 2 ppd)  . Alcohol use No  . Drug use: No  . Sexual activity: No   Other Topics Concern  . Not on file   Social History Narrative   Widowed, lives alone. 1 child in [place] and 1 in [place]. 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      Allergies as of 07/23/2016      Reactions   Adhesive [tape] Other (See Comments)   Caused blisters   Amoxicillin Other (See Comments)   Caused thrush   Ciprofloxacin Other (See Comments)   Caused thrush   Ultram [tramadol] Other (See Comments)   hypotension      Medication List       Accurate as of 07/23/16 11:59 PM. Always use your most recent med list.          acetaminophen 500 MG tablet Commonly known  as:  TYLENOL Take 500 mg by mouth every 6 (six) hours as needed.   albuterol 108 (90 Base) MCG/ACT inhaler Commonly known as:  VENTOLIN HFA Inhale 2 puffs into the lungs 2 (two) times daily as needed for wheezing or shortness of breath.   apixaban 5 MG Tabs tablet Commonly known as:  ELIQUIS Take 5 mg by mouth 2 (two) times daily.   azelastine 0.1 % nasal spray Commonly known as:  ASTELIN Place 2 sprays into both nostrils at bedtime as needed for rhinitis. Use in each nostril as directed   cholecalciferol 1000 units tablet Commonly known as:  VITAMIN D Take 1,000 Units by mouth daily.   diltiazem 30 MG tablet Commonly known as:  CARDIZEM Take 1 tablet (30 mg total) by mouth 4 (four) times daily. Take at onset on atrial fibrillation   DULoxetine 30 MG capsule Commonly known as:  CYMBALTA Take 1 capsule (30 mg total) by mouth daily.     fludrocortisone 0.1 MG tablet Commonly known as:  FLORINEF Take 0.1 mg by mouth daily.   fluticasone 50 MCG/ACT nasal spray Commonly known as:  FLONASE Place 2 sprays into both nostrils daily.   meclizine 25 MG tablet Commonly known as:  ANTIVERT Take 1 or 2 po Q 6hrs for dizziness   polyethylene glycol powder powder Commonly known as:  GLYCOLAX/MIRALAX Take 17 g by mouth 2 (two) times daily.   SYMBICORT 160-4.5 MCG/ACT inhaler Generic drug:  budesonide-formoterol INHALE 2 PUFFS BY MOUTH INTO THE LUNGS TWICE DAILY.   SYSTANE BALANCE OP Apply 2 drops to eye 3 (three) times daily as needed.   trimethoprim 100 MG tablet Commonly known as:  TRIMPEX Take 1 tablet (100 mg total) by mouth daily. Reported on 10/03/2015   vitamin B-12 1000 MCG tablet Commonly known as:  CYANOCOBALAMIN Take 1,000 mcg by mouth daily.   vitamin C 1000 MG tablet Take 1,000 mg by mouth daily.          Objective:   Physical Exam BP 124/80 (BP Location: Right Arm, Patient Position: Sitting, Cuff Size: Normal)   Pulse 76   Temp 97.7 F (36.5 C) (Oral)   Resp 14   Ht '5\' 10"'$  (1.778 m)   Wt 152 lb 6 oz (69.1 kg)   SpO2 95%   BMI 21.86 kg/m  General:   Well developed, well nourished . NAD.  HEENT:  Normocephalic . Face symmetric, atraumatic. Nose not congested Lungs:  Decreased breath sounds. Chest wall: Slightly TTP at the left  side of the sternum but the pain is not similar to that pressure she is describing. Normal respiratory effort, no intercostal retractions, no accessory muscle use. Heart: RRR,  no murmur.  No pretibial edema bilaterally  Abdomen: Soft, not tender, not distended Skin: Not pale. Not jaundice Neurologic:  alert & oriented X3.  Speech normal, gait appropriate for age and unassisted Psych--  Cognition and judgment appear intact.  Cooperative with normal attention span and concentration.  Behavior appropriate. No anxious or depressed appearing.      Assessment &  Plan:   Assessment   A1c 5.7 01-2015 Hyperlipidemia Anxiety depression  Dr Ferdinand Lango (psychology), meds per pcp (see OV note from 02-19-16) Sx onset ~ 2007:  2012, 2013 was on citalopram. Then sertraline. Unable to tolerate Prozac on Wellbutrin 02-2015 (tired, dizzy). Trial with Cymbalta 01-2016. COPD   CV: P-Atrial fibrillation Dr Gerrit Friends, flecanaide prn /cardiazem prn. Echo 11-2015 @ [facility] Low BP- likely d/t parkinson, rx fludrocortisone ~  11-2015  GI: --GERD --Abnormal abdominal US mild pancreatic duct enlargement  --IBS used to be constipated, now diarrhea predominant --dysphagia MBE 02-22-15 wnl DJD--- take Celebrex very seldom Recurrent UTIs -- qd abx prn  , has seen urology Dr Edwena Blow before NEURO: ---Parkinsonism ---  Dr Tat ---Gait d/o (multifactorial likely) ---Peripheral neuropathy (pr neuro note 04-2015) ---mild cognitive impairmentairment (see neuro note 04-2015), dx confirmed by neuropsychiatry evaluation 07-2015 ---MBE 02/22/2015 normal admitted  01-2015: COPD and RVR   PLAN   Chest pain: 83 year old lady with a somewhat atypical chest pain, at rest, not exertional in the context of a recent bronchitis. EKG showed atrial fibrillation, rate 81, no acute changes. Plan: Get a chest x-ray, ER if symptoms severe, will ask cardiology to see within few days.  Skin lesions: Refer to dermatology per patient request [facility] about switching PCP, she likes Dr Maudie Mercury,  office is closer to home and she likes her. I encouraged her to call her office and see about changing. RTC 3-4 months

## 2016-07-23 NOTE — Progress Notes (Signed)
Pre visit review using our clinic review tool, if applicable. No additional management support is needed unless otherwise documented below in the visit note. 

## 2016-07-23 NOTE — Patient Instructions (Addendum)
GO TO THE LAB : Get the blood work     GO TO THE FRONT DESK Schedule your next appointment for a  checkup in 3 or 4 months.    STOP BY THE FIRST FLOOR:  get the XR     Use your inhaler as needed  If you have severe chest pain go to the ER, we are referring you to see the  heart doctor

## 2016-07-24 NOTE — Assessment & Plan Note (Signed)
Chest pain: 83 year old lady with a somewhat atypical chest pain, at rest, not exertional in the context of a recent bronchitis. EKG showed atrial fibrillation, rate 81, no acute changes. Plan: Get a chest x-ray, ER if symptoms severe, will ask cardiology to see within few days.  Skin lesions: Refer to dermatology per patient request [facility] about switching PCP, she likes Dr Maudie Mercury,  office is closer to home and she likes her. I encouraged her to call her office and see about changing. RTC 3-4 months

## 2016-08-05 ENCOUNTER — Ambulatory Visit (INDEPENDENT_AMBULATORY_CARE_PROVIDER_SITE_OTHER): Payer: Medicare Other | Admitting: Psychiatry

## 2016-08-05 DIAGNOSIS — F411 Generalized anxiety disorder: Secondary | ICD-10-CM

## 2016-08-18 ENCOUNTER — Other Ambulatory Visit: Payer: Self-pay | Admitting: Internal Medicine

## 2016-08-25 ENCOUNTER — Emergency Department (HOSPITAL_COMMUNITY): Payer: Medicare Other

## 2016-08-25 ENCOUNTER — Encounter (HOSPITAL_COMMUNITY): Payer: Self-pay | Admitting: *Deleted

## 2016-08-25 ENCOUNTER — Inpatient Hospital Stay (HOSPITAL_COMMUNITY)
Admission: EM | Admit: 2016-08-25 | Discharge: 2016-08-29 | DRG: 308 | Disposition: A | Discharge status: Home / Self Care | Payer: Medicare Other | Attending: Family Medicine | Admitting: Family Medicine

## 2016-08-25 ENCOUNTER — Other Ambulatory Visit: Payer: Self-pay

## 2016-08-25 DIAGNOSIS — K5900 Constipation, unspecified: Secondary | ICD-10-CM | POA: Diagnosis present

## 2016-08-25 DIAGNOSIS — R509 Fever, unspecified: Secondary | ICD-10-CM

## 2016-08-25 DIAGNOSIS — J440 Chronic obstructive pulmonary disease with acute lower respiratory infection: Secondary | ICD-10-CM | POA: Diagnosis not present

## 2016-08-25 DIAGNOSIS — M25552 Pain in left hip: Secondary | ICD-10-CM

## 2016-08-25 DIAGNOSIS — J189 Pneumonia, unspecified organism: Secondary | ICD-10-CM | POA: Diagnosis present

## 2016-08-25 DIAGNOSIS — G20 Parkinson's disease: Secondary | ICD-10-CM | POA: Diagnosis not present

## 2016-08-25 DIAGNOSIS — J42 Unspecified chronic bronchitis: Secondary | ICD-10-CM | POA: Diagnosis not present

## 2016-08-25 DIAGNOSIS — Z87891 Personal history of nicotine dependence: Secondary | ICD-10-CM

## 2016-08-25 DIAGNOSIS — R918 Other nonspecific abnormal finding of lung field: Secondary | ICD-10-CM | POA: Diagnosis not present

## 2016-08-25 DIAGNOSIS — E876 Hypokalemia: Secondary | ICD-10-CM | POA: Diagnosis not present

## 2016-08-25 DIAGNOSIS — I4891 Unspecified atrial fibrillation: Secondary | ICD-10-CM | POA: Diagnosis not present

## 2016-08-25 DIAGNOSIS — M1990 Unspecified osteoarthritis, unspecified site: Secondary | ICD-10-CM | POA: Diagnosis not present

## 2016-08-25 DIAGNOSIS — I951 Orthostatic hypotension: Secondary | ICD-10-CM | POA: Diagnosis present

## 2016-08-25 DIAGNOSIS — M25559 Pain in unspecified hip: Secondary | ICD-10-CM

## 2016-08-25 DIAGNOSIS — J449 Chronic obstructive pulmonary disease, unspecified: Secondary | ICD-10-CM | POA: Diagnosis present

## 2016-08-25 DIAGNOSIS — Z7951 Long term (current) use of inhaled steroids: Secondary | ICD-10-CM

## 2016-08-25 DIAGNOSIS — Z880 Allergy status to penicillin: Secondary | ICD-10-CM

## 2016-08-25 DIAGNOSIS — Z79899 Other long term (current) drug therapy: Secondary | ICD-10-CM

## 2016-08-25 DIAGNOSIS — M25452 Effusion, left hip: Secondary | ICD-10-CM | POA: Diagnosis present

## 2016-08-25 DIAGNOSIS — R208 Other disturbances of skin sensation: Secondary | ICD-10-CM | POA: Diagnosis not present

## 2016-08-25 DIAGNOSIS — Z881 Allergy status to other antibiotic agents status: Secondary | ICD-10-CM

## 2016-08-25 DIAGNOSIS — I6789 Other cerebrovascular disease: Secondary | ICD-10-CM | POA: Diagnosis not present

## 2016-08-25 DIAGNOSIS — S76312A Strain of muscle, fascia and tendon of the posterior muscle group at thigh level, left thigh, initial encounter: Secondary | ICD-10-CM | POA: Diagnosis not present

## 2016-08-25 DIAGNOSIS — Z885 Allergy status to narcotic agent status: Secondary | ICD-10-CM

## 2016-08-25 DIAGNOSIS — J9811 Atelectasis: Secondary | ICD-10-CM | POA: Diagnosis not present

## 2016-08-25 DIAGNOSIS — I480 Paroxysmal atrial fibrillation: Secondary | ICD-10-CM | POA: Diagnosis not present

## 2016-08-25 DIAGNOSIS — F329 Major depressive disorder, single episode, unspecified: Secondary | ICD-10-CM | POA: Diagnosis present

## 2016-08-25 DIAGNOSIS — Z91048 Other nonmedicinal substance allergy status: Secondary | ICD-10-CM

## 2016-08-25 DIAGNOSIS — Q248 Other specified congenital malformations of heart: Secondary | ICD-10-CM

## 2016-08-25 DIAGNOSIS — G629 Polyneuropathy, unspecified: Secondary | ICD-10-CM | POA: Diagnosis present

## 2016-08-25 DIAGNOSIS — Z7901 Long term (current) use of anticoagulants: Secondary | ICD-10-CM

## 2016-08-25 DIAGNOSIS — D649 Anemia, unspecified: Secondary | ICD-10-CM | POA: Diagnosis present

## 2016-08-25 DIAGNOSIS — R001 Bradycardia, unspecified: Secondary | ICD-10-CM | POA: Diagnosis present

## 2016-08-25 DIAGNOSIS — F419 Anxiety disorder, unspecified: Secondary | ICD-10-CM | POA: Diagnosis present

## 2016-08-25 LAB — BASIC METABOLIC PANEL
Anion gap: 10 (ref 5–15)
BUN: 10 mg/dL (ref 6–20)
CO2: 30 mmol/L (ref 22–32)
Calcium: 9.1 mg/dL (ref 8.9–10.3)
Chloride: 100 mmol/L — ABNORMAL LOW (ref 101–111)
Creatinine, Ser: 0.7 mg/dL (ref 0.44–1.00)
GFR calc Af Amer: >60 mL/min (ref >60)
GFR calc non Af Amer: >60 mL/min (ref >60)
Glucose, Bld: 101 mg/dL — ABNORMAL HIGH (ref 65–99)
Potassium: 3.3 mmol/L — ABNORMAL LOW (ref 3.5–5.1)
Sodium: 140 mmol/L (ref 135–145)

## 2016-08-25 LAB — URINALYSIS, MICROSCOPIC (REFLEX)

## 2016-08-25 LAB — CBC WITH DIFFERENTIAL/PLATELET
Basophils Absolute: 0 10*3/uL (ref 0.0–0.1)
Basophils Relative: 0 %
Eosinophils Absolute: 0 10*3/uL (ref 0.0–0.7)
Eosinophils Relative: 0 %
HCT: 38.5 % (ref 36.0–46.0)
Hemoglobin: 12.4 g/dL (ref 12.0–15.0)
Lymphocytes Relative: 7 %
Lymphs Abs: 0.7 10*3/uL (ref 0.7–4.0)
MCH: 29.8 pg (ref 26.0–34.0)
MCHC: 32.2 g/dL (ref 30.0–36.0)
MCV: 92.5 fL (ref 78.0–100.0)
Monocytes Absolute: 1 10*3/uL (ref 0.1–1.0)
Monocytes Relative: 10 %
Neutro Abs: 8 10*3/uL — ABNORMAL HIGH (ref 1.7–7.7)
Neutrophils Relative %: 83 %
Platelets: 176 10*3/uL (ref 150–400)
RBC: 4.16 MIL/uL (ref 3.87–5.11)
RDW: 15.4 % (ref 11.5–15.5)
WBC: 9.7 10*3/uL (ref 4.0–10.5)

## 2016-08-25 LAB — URINALYSIS, ROUTINE W REFLEX MICROSCOPIC
Bilirubin Urine: NEGATIVE
Glucose, UA: NEGATIVE mg/dL
Hgb urine dipstick: NEGATIVE
Ketones, ur: 15 mg/dL — AB
Nitrite: NEGATIVE
Protein, ur: NEGATIVE mg/dL
Specific Gravity, Urine: 1.015 (ref 1.005–1.030)
pH: 7.5 (ref 5.0–8.0)

## 2016-08-25 LAB — LACTIC ACID, PLASMA: Lactic Acid, Venous: 1.4 mmol/L (ref 0.5–1.9)

## 2016-08-25 LAB — APTT: APTT: 37 s — AB (ref 24–36)

## 2016-08-25 LAB — HEPARIN LEVEL (UNFRACTIONATED): Heparin Unfractionated: 1.26 IU/mL — ABNORMAL HIGH (ref 0.30–0.70)

## 2016-08-25 LAB — C-REACTIVE PROTEIN: CRP: <0.8 mg/dL (ref <1.0)

## 2016-08-25 LAB — SEDIMENTATION RATE: Sed Rate: 12 mm/hr (ref 0–22)

## 2016-08-25 MED ORDER — POTASSIUM CHLORIDE CRYS ER 20 MEQ PO TBCR
40.0000 meq | EXTENDED_RELEASE_TABLET | Freq: Once | ORAL | Status: AC
  Administered 2016-08-25: 40 meq via ORAL
  Filled 2016-08-25: qty 2

## 2016-08-25 MED ORDER — ALBUTEROL SULFATE (2.5 MG/3ML) 0.083% IN NEBU: 2.5000 mg | INHALATION_SOLUTION | RESPIRATORY_TRACT | Status: DC | PRN

## 2016-08-25 MED ORDER — LORAZEPAM 2 MG/ML IJ SOLN
1.0000 mg | Freq: Once | INTRAMUSCULAR | Status: AC
  Administered 2016-08-25: 1 mg via INTRAVENOUS
  Filled 2016-08-25: qty 1

## 2016-08-25 MED ORDER — GUAIFENESIN ER 600 MG PO TB12
600.0000 mg | ORAL_TABLET | Freq: Two times a day (BID) | ORAL | Status: DC
  Administered 2016-08-26 – 2016-08-29 (×8): 600 mg via ORAL
  Filled 2016-08-25 (×8): qty 1

## 2016-08-25 MED ORDER — ACETAMINOPHEN 500 MG PO TABS
1000.0000 mg | ORAL_TABLET | Freq: Once | ORAL | Status: AC
  Administered 2016-08-25: 1000 mg via ORAL
  Filled 2016-08-25: qty 2

## 2016-08-25 MED ORDER — SODIUM CHLORIDE 0.9 % IV SOLN
INTRAVENOUS | Status: AC
  Administered 2016-08-26: via INTRAVENOUS

## 2016-08-25 MED ORDER — HEPARIN (PORCINE) IN NACL 100-0.45 UNIT/ML-% IJ SOLN
1150.0000 [IU]/h | INTRAMUSCULAR | Status: DC
  Administered 2016-08-25: 1050 [IU]/h via INTRAVENOUS
  Filled 2016-08-25: qty 250

## 2016-08-25 MED ORDER — METHOCARBAMOL 500 MG PO TABS: 500.0000 mg | ORAL_TABLET | Freq: Three times a day (TID) | ORAL | Status: DC | PRN

## 2016-08-25 MED ORDER — SODIUM CHLORIDE 0.9 % IV BOLUS (SEPSIS)
1000.0000 mL | Freq: Once | INTRAVENOUS | Status: AC
  Administered 2016-08-25: 1000 mL via INTRAVENOUS

## 2016-08-25 MED ORDER — MORPHINE SULFATE (PF) 4 MG/ML IV SOLN
4.0000 mg | Freq: Once | INTRAVENOUS | Status: AC
  Administered 2016-08-25: 4 mg via INTRAVENOUS
  Filled 2016-08-25: qty 1

## 2016-08-25 MED ORDER — DULOXETINE HCL 30 MG PO CPEP
30.0000 mg | ORAL_CAPSULE | Freq: Every day | ORAL | Status: DC
  Administered 2016-08-26 – 2016-08-29 (×4): 30 mg via ORAL
  Filled 2016-08-25 (×4): qty 1

## 2016-08-25 MED ORDER — MOMETASONE FURO-FORMOTEROL FUM 200-5 MCG/ACT IN AERO
2.0000 | INHALATION_SPRAY | Freq: Two times a day (BID) | RESPIRATORY_TRACT | Status: DC
Start: 2016-08-26 — End: 2016-08-29
  Administered 2016-08-26 – 2016-08-29 (×7): 2 via RESPIRATORY_TRACT
  Filled 2016-08-25: qty 8.8

## 2016-08-25 MED ORDER — GADOBENATE DIMEGLUMINE 529 MG/ML IV SOLN
15.0000 mL | Freq: Once | INTRAVENOUS | Status: AC
  Administered 2016-08-25: 15 mL via INTRAVENOUS

## 2016-08-25 MED ORDER — HYDROCODONE-ACETAMINOPHEN 5-325 MG PO TABS
1.0000 | ORAL_TABLET | ORAL | Status: DC | PRN
  Administered 2016-08-28: 1 via ORAL
  Filled 2016-08-25: qty 1

## 2016-08-25 MED ORDER — POLYETHYLENE GLYCOL 3350 17 G PO PACK
17.0000 g | PACK | Freq: Every day | ORAL | Status: DC | PRN
  Administered 2016-08-28: 17 g via ORAL
  Filled 2016-08-25: qty 1

## 2016-08-25 MED ORDER — ONDANSETRON HCL 4 MG/2ML IJ SOLN: 4.0000 mg | Freq: Four times a day (QID) | INTRAMUSCULAR | Status: DC | PRN

## 2016-08-25 MED ORDER — FLUDROCORTISONE ACETATE 0.1 MG PO TABS
0.1000 mg | ORAL_TABLET | Freq: Every day | ORAL | Status: DC
  Administered 2016-08-26 – 2016-08-29 (×4): 0.1 mg via ORAL
  Filled 2016-08-25 (×4): qty 1

## 2016-08-25 MED ORDER — ACETAMINOPHEN 650 MG RE SUPP: 650.0000 mg | Freq: Four times a day (QID) | RECTAL | Status: DC | PRN

## 2016-08-25 MED ORDER — IPRATROPIUM BROMIDE 0.02 % IN SOLN: 0.5000 mg | Freq: Four times a day (QID) | RESPIRATORY_TRACT | Status: DC

## 2016-08-25 MED ORDER — SENNA 8.6 MG PO TABS
1.0000 | ORAL_TABLET | Freq: Two times a day (BID) | ORAL | Status: DC
  Administered 2016-08-26 – 2016-08-29 (×7): 8.6 mg via ORAL
  Filled 2016-08-25 (×8): qty 1

## 2016-08-25 MED ORDER — DILTIAZEM HCL 30 MG PO TABS
30.0000 mg | ORAL_TABLET | Freq: Four times a day (QID) | ORAL | Status: DC
  Administered 2016-08-26 – 2016-08-29 (×13): 30 mg via ORAL
  Filled 2016-08-25 (×14): qty 1

## 2016-08-25 MED ORDER — HYDROCODONE-ACETAMINOPHEN 5-325 MG PO TABS: 1.0000 | ORAL_TABLET | ORAL | Status: DC | PRN

## 2016-08-25 MED ORDER — ONDANSETRON HCL 4 MG PO TABS: 4.0000 mg | ORAL_TABLET | Freq: Four times a day (QID) | ORAL | Status: DC | PRN

## 2016-08-25 MED ORDER — ACETAMINOPHEN 325 MG PO TABS: 650.0000 mg | ORAL_TABLET | Freq: Four times a day (QID) | ORAL | Status: DC | PRN

## 2016-08-25 NOTE — ED Provider Notes (Signed)
[facility] DEPT Provider Note   CSN: [id number] Arrival date & time: 08/25/16  0859     History   Chief Complaint Chief Complaint  Patient presents with  . Hip Pain  . Leg Pain    HPI [name] is a 83 y.o. female.  HPI  88yF with LLE pain. Onset while sleeping in bed last night. Severe pain which is worse with movement. Hurts from hip down into foot, but worse in hip/groin. No fever or chills. No numbness or tingling. Lower back pain but reports hx of back pain and no acute change in this. Denies hx of back or L hip surgery. Denies recent trauma. Has been urinating more frequently recently.no dysuria.   Past Medical History:  Diagnosis Date  . Anxiety   . Arthritis   . Atrial fibrillation ([place])   . Cardiac arrhythmia due to congenital heart disease   . Chronic constipation   . COPD (chronic obstructive pulmonary disease) ([place])   . Depression   . GERD (gastroesophageal reflux disease)   . Hemorrhoid   . Hyperlipidemia   . Idiopathic hypotension   . Lightheadedness 11/2015  . Mycotic toenails 10/27/2012  . Parkinson disease ([place])   . Recurrent UTI   . 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 Varicose veins     Patient Active Problem List   Diagnosis Date Noted  . Failure to thrive in adult 04/01/2016  . Blurry vision, right eye   . PCP NOTES >>>>>>>>>>>>>>>>>>>>>>> 03/02/2015  . Atrial fibrillation with RVR ([place]) 01/31/2015  . COPD (chronic obstructive pulmonary disease) ([place]) 01/31/2015  . Lower abdominal pain 12/05/2014  . Other fatigue 06/21/2014  . Lightheadedness 06/12/2014  . Fall from other slipping, tripping, or stumbling 11/17/2013  . COPD exacerbation ([place]) 09/26/2013  . Musculoskeletal pain 05/21/2012  . IBS (irritable bowel syndrome) 04/02/2012  . Physical exam, annual 11/25/2011  . Anxiety and depression 07/10/2011  . Parkinson's disease ([place]) 02/03/2011  . Shoulder pain 02/03/2011  . Recurrent UTI 11/01/2010  . GERD  (gastroesophageal reflux disease) 11/01/2010  . Memory loss 11/01/2010  . Hip pain, right 09/23/2010  . COMPRESSION FRACTURE, LUMBAR VERTEBRAE 03/28/2010  . Abdominal pain 03/27/2010  . DIZZINESS 03/22/2010  . Mycotic toenails 10/02/2009  . DYSPNEA ON EXERTION 01/19/2009  . HEMORRHOIDS-INTERNAL 02/14/2008  . Constipation 02/14/2008  . PERSONAL HX COLONIC POLYPS 02/14/2008    Past Surgical History:  Procedure Laterality Date  . APPENDECTOMY  83 years old  . COLON RESECTION  2008  . vitriectomy  08-2015    OB History    No data available       Home Medications    Prior to Admission medications   Medication Sig Start Date End Date Taking? Authorizing Provider  acetaminophen (TYLENOL) 500 MG tablet Take 500 mg by mouth every 6 (six) hours as needed.    [provider]  albuterol (VENTOLIN HFA) 108 (90 BASE) MCG/ACT inhaler Inhale 2 puffs into the lungs 2 (two) times daily as needed for wheezing or shortness of breath. Patient not taking: Reported on 07/23/2016 02/05/15   Midge Minium, MD  apixaban (ELIQUIS) 5 MG TABS tablet Take 5 mg by mouth 2 (two) times daily.    [provider]  Ascorbic Acid (VITAMIN C) 1000 MG tablet Take 1,000 mg by mouth daily.    [provider]  azelastine (ASTELIN) 0.1 % nasal spray Place 2 sprays into both nostrils at bedtime as needed for rhinitis. Use in each nostril as  directed Patient not taking: Reported on 07/23/2016 07/14/16   Saguier, Percell Miller, PA-C  cholecalciferol (VITAMIN D) 1000 units tablet Take 1,000 Units by mouth daily.    [provider]  diltiazem (CARDIZEM) 30 MG tablet Take 1 tablet (30 mg total) by mouth 4 (four) times daily. Take at onset on atrial fibrillation 02/02/15   Tat, David, MD  DULoxetine (CYMBALTA) 30 MG capsule Take 1 capsule (30 mg total) by mouth daily. 08/18/16   Colon Branch, MD  fludrocortisone (FLORINEF) 0.1 MG tablet Take 0.1 mg by mouth daily.    [provider]    fluticasone (FLONASE) 50 MCG/ACT nasal spray Place 2 sprays into both nostrils daily. 07/14/16   Saguier, Percell Miller, PA-C  meclizine (ANTIVERT) 25 MG tablet Take 1 or 2 po Q 6hrs for dizziness 11/24/15   Rolland Porter, MD  polyethylene glycol powder (GLYCOLAX/MIRALAX) powder Take 17 g by mouth 2 (two) times daily. 05/28/16   Waynetta Pean, PA-C  Propylene Glycol (SYSTANE BALANCE OP) Apply 2 drops to eye 3 (three) times daily as needed.    [provider]  SYMBICORT 160-4.5 MCG/ACT inhaler INHALE 2 PUFFS BY MOUTH INTO THE LUNGS TWICE DAILY. Patient not taking: Reported on 07/23/2016 11/09/15   Brunetta Jeans, PA-C  trimethoprim (TRIMPEX) 100 MG tablet Take 1 tablet (100 mg total) by mouth daily. Reported on 10/03/2015 Patient not taking: Reported on 07/23/2016 01/31/16   Colon Branch, MD  vitamin B-12 (CYANOCOBALAMIN) 1000 MCG tablet Take 1,000 mcg by mouth daily.    [provider]    Family History Family History  Problem Relation Age of Onset  . Asthma Brother   . Alcohol abuse Brother   . Throat cancer Father   . Alcohol abuse Father   . Lupus Daughter   . Bipolar disorder Daughter   . Lupus Daughter   . Anxiety disorder Daughter   . Alcohol abuse Sister   . Alcohol abuse Maternal Grandfather   . Alcohol abuse Paternal Grandmother   . Breast cancer Sister   . Colon cancer Neg Hx     Social History Social History  Substance Use Topics  . Smoking status: Former Smoker    Packs/day: 2.00    Years: 30.00    Types: Cigarettes    Quit date: 04/21/1980  . Smokeless tobacco: Never Used     Comment: onset age 33 -40, up to > 1ppd (almost 2 ppd)  . Alcohol use No     Allergies   Adhesive [tape]; Amoxicillin; Ciprofloxacin; and Ultram [tramadol]   Review of Systems Review of Systems  All systems reviewed and negative, other than as noted in HPI.  Physical Exam Updated Vital Signs BP (!) 144/89   Pulse 90   Temp (!) 101.7 F (38.7 C) (Rectal)   Resp (!) 21   Ht  '5\' 10"'$  (1.778 m)   Wt 155 lb (70.3 kg)   SpO2 93%   BMI 22.24 kg/m   Physical Exam  Constitutional: She appears well-developed and well-nourished. No distress.  HENT:  Head: Normocephalic and atraumatic.  Eyes: Conjunctivae are normal. Right eye exhibits no discharge. Left eye exhibits no discharge.  Neck: Neck supple.  Cardiovascular: Normal rate and normal heart sounds.  Exam reveals no gallop and no friction rub.   No murmur heard. irrreg irreg  Pulmonary/Chest: Effort normal and breath sounds normal. No respiratory distress.  Abdominal: Soft. She exhibits no distension. There is tenderness.  Mild suprapubic and LLQ tenderness  Musculoskeletal:  She exhibits no edema or tenderness.  LLE grossly normal in appearance aside from some varicose veins. No concerning skin lesions. Significant pain with passive and active ROM of L hip. No discrete bony tenderness. Easily palpable DP pulse. Sensation intact to light touch. No midline spinal tenderness.   Neurological: She is alert.  Skin: Skin is warm and dry.  Psychiatric: She has a normal mood and affect. Her behavior is normal. Thought content normal.  Nursing note and vitals reviewed.    ED Treatments / Results  Labs (all labs ordered are listed, but only abnormal results are displayed) Labs Reviewed  URINALYSIS, ROUTINE W REFLEX MICROSCOPIC - Abnormal; Notable for the following:       Result Value   Ketones, ur 15 (*)    Leukocytes, UA TRACE (*)    All other components within normal limits  CBC WITH DIFFERENTIAL/PLATELET - Abnormal; Notable for the following:    Neutro Abs 8.0 (*)    All other components within normal limits  BASIC METABOLIC PANEL - Abnormal; Notable for the following:    Potassium 3.3 (*)    Chloride 100 (*)    Glucose, Bld 101 (*)    All other components within normal limits  URINALYSIS, MICROSCOPIC (REFLEX) - Abnormal; Notable for the following:    Bacteria, UA RARE (*)    Squamous Epithelial / LPF 0-5  (*)    All other components within normal limits  CULTURE, BLOOD (ROUTINE X 2)  CULTURE, BLOOD (ROUTINE X 2)  SEDIMENTATION RATE  C-REACTIVE PROTEIN  LACTIC ACID, PLASMA    EKG  EKG Interpretation None       Radiology Mr Hip Left W Wo Contrast  Result Date: 08/25/2016 CLINICAL DATA:  Left leg pain, unable to walk EXAM: MRI OF THE LEFT HIP WITHOUT AND WITH CONTRAST TECHNIQUE: Multiplanar, multisequence MR imaging was performed both before and after administration of intravenous contrast. CONTRAST:  26m MULTIHANCE GADOBENATE DIMEGLUMINE 529 MG/ML IV SOLN COMPARISON:  None. FINDINGS: Bones: No hip fracture, dislocation or avascular necrosis. No periosteal reaction or bone destruction. No aggressive osseous lesion. Normal sacrum and sacroiliac joints. No SI joint widening or erosive changes. Degenerative disc disease with disc height loss at L3-4, L4-5 and L5-S1. Articular cartilage and labrum Articular cartilage: High-grade partial-thickness cartilage loss of the left femoral head and acetabulum. Partial-thickness cartilage loss of the right femoral head and acetabulum. Bilateral inferior femoral head marginal osteophytes. Labrum: Grossly intact, but evaluation is limited by lack of intraarticular fluid. Joint or bursal effusion Joint effusion: Small left hip joint effusion. No right hip joint effusion. No SI joint effusion. Bursae:  No bursa formation. Muscles and tendons Flexors: Normal. Extensors: Normal. Abductors: Normal. Adductors: Normal. Gluteals: Normal. Hamstrings: Mild tendinosis of the left hamstring origin. Small partial tear of the right hamstring origin. Other findings Miscellaneous: No pelvic free fluid. No fluid collection or hematoma. No inguinal lymphadenopathy. No inguinal hernia. Incidental note made of a left pelvic kidney. IMPRESSION: 1. No hip fracture, dislocation or avascular necrosis. 2. Moderate osteoarthritis of bilateral hips, left greater than right. Small left hip  joint effusion. 3. Mild tendinosis of the left hamstring origin. 4. Small partial tear of the right hamstring origin. 5. Degenerative disc disease with disc height loss at L3-4, L4-5 and L5-S1. Electronically Signed   By: HKathreen Devoid  On: 08/25/2016 15:13   Dg Hip Unilat W Or Wo Pelvis 2-3 Views Left  Result Date: 08/25/2016 CLINICAL DATA:  Chronic left hip pain  EXAM: DG HIP (WITH OR WITHOUT PELVIS) 2-3V LEFT COMPARISON:  None. FINDINGS: Moderate osteoarthritic changes in the hips bilaterally with joint space narrowing and spurring. SI joints are symmetric and unremarkable. No acute bony abnormality. Specifically, no fracture, subluxation, or dislocation. Soft tissues are intact. IMPRESSION: Moderate symmetric degenerative changes in the hips bilaterally. No acute findings. Electronically Signed   By: Rolm Baptise M.D.   On: 08/25/2016 09:56    Procedures Procedures (including critical care time)  Medications Ordered in ED Medications  LORazepam (ATIVAN) injection 1 mg (not administered)  potassium chloride SA (K-DUR,KLOR-CON) CR tablet 40 mEq (not administered)  acetaminophen (TYLENOL) tablet 1,000 mg (1,000 mg Oral Given 08/25/16 0958)  morphine 4 MG/ML injection 4 mg (4 mg Intravenous Given 08/25/16 0959)  sodium chloride 0.9 % bolus 1,000 mL (1,000 mLs Intravenous New Bag/Given 08/25/16 0958)     Initial Impression / Assessment and Plan / ED Course  I have reviewed the triage vital signs and the nursing notes.  Pertinent labs & imaging results that were available during my care of the patient were reviewed by me and considered in my medical decision making (see chart for details).     88yF with L hip pain. No trauma. Significant pain with ROM. Not clearly lumbar radiculopathy. Felt warm to touch. Rectal temp elevated. Does have some mild lower abdominal tenderness and increased urinary frequency. Fever could be from UTI. Some concern for septic arthritis. Plain films of hip not overly  concerning. Will check inflammatory markers. MRI hip.   Urinalysis looks fine. Small L hip effusion on MR. Inflammatory markers normal though which I wouldn't expect with septic joint. L hip pain is presenting complaint though and I do not have a clear alternative source of her fever. Will check a CXR but she doesn't have any acute respiratory complaints. Will discuss with orthopedics.   Final Clinical Impressions(s) / ED Diagnoses   Final diagnoses:  Hip pain    New Prescriptions New Prescriptions   No medications on file     Virgel Manifold, MD 08/31/16 1527

## 2016-08-25 NOTE — Progress Notes (Signed)
ANTICOAGULATION CONSULT NOTE - Initial Consult  Pharmacy Consult for Heparin Indication: atrial fibrillation  Allergies  Allergen Reactions  . Adhesive [Tape] Other (See Comments)    Caused blisters  . Amoxicillin Other (See Comments)    Has patient had a PCN reaction causing immediate rash, facial/tongue/throat swelling, SOB or lightheadedness with hypotension: Yes Has patient had a PCN reaction causing severe rash involving mucus membranes or skin necrosis: No Has patient had a PCN reaction that required hospitalization No Has patient had a PCN reaction occurring within the last 10 years: Yes If all of the above answers are "NO", then may proceed with Cephalosporin use.   Caused thrush  . Ciprofloxacin Other (See Comments)    Caused thrush  . Ultram [Tramadol] Other (See Comments)    hypotension    Patient Measurements: Height: '5\' 10"'$  (177.8 cm) Weight: 155 lb (70.3 kg) IBW/kg (Calculated) : 68.5 Heparin Dosing Weight: 70.3 kg  Vital Signs: Temp: 98.8 F (37.1 C) (05/07 1343) Temp Source: Oral (05/07 1343) BP: 121/83 (05/07 1815) Pulse Rate: 79 (05/07 1815)  Labs:  Recent Labs  08/25/16 0942  HGB 12.4  HCT 38.5  PLT 176  CREATININE 0.70    Estimated Creatinine Clearance: 54.6 mL/min (by C-G formula based on SCr of 0.7 mg/dL).   Medical History: Past Medical History:  Diagnosis Date  . Anxiety   . Arthritis   . Atrial fibrillation ([place])   . Cardiac arrhythmia due to congenital heart disease   . Chronic constipation   . COPD (chronic obstructive pulmonary disease) ([place])   . Depression   . GERD (gastroesophageal reflux disease)   . Hemorrhoid   . Hyperlipidemia   . Idiopathic hypotension   . Lightheadedness 11/2015  . Mycotic toenails 10/27/2012  . Parkinson disease ([place])   . Recurrent UTI   . 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 Varicose veins     Medications:   (Not in a hospital admission) Scheduled:  Infusions:   Assessment: 83yo  female with history of Afib on apixaban PTA. Last dose was taken 5/6 at 1800. Pharmacy is consulted to dose heparin for atrial fibrillation while awaiting procedure.  Goal of Therapy:  Heparin level 0.3-0.7 units/ml aPTT 66-102 seconds Monitor platelets by anticoagulation protocol: Yes   Plan:  Start heparin infusion at 1050 units/hr Check anti-Xa level in 8 hours and daily while on heparin Continue to monitor H&H and platelets  Andrey Cota. Diona Foley, PharmD, BCPS Clinical Pharmacist [phone] 08/25/2016,7:15 PM

## 2016-08-25 NOTE — ED Notes (Signed)
Patient transported to X-ray 

## 2016-08-25 NOTE — ED Notes (Signed)
Patient transported to MRI 

## 2016-08-25 NOTE — ED Provider Notes (Signed)
4:57 PM Assumed care from Dr. Wilson Singer, please see their note for full history, physical and decision making until this point. In brief this is a 83 y.o. year old female who presented to the ED tonight with Hip Pain and Leg Pain     Pending hip arthrocentesis for disposition.   While waiting for her arthrocentesis x-ray tech called to the do not doesn't do it secondary to her be on a liquids. She last had L Oquist last night and the need to wait 48 hours since her last dose. I discussed with her cardiology practice who wants [place] to start on heparin pending the arthrocentesis. We'll admit to medicine.  Labs, studies and imaging reviewed by myself and considered in medical decision making if ordered. Imaging interpreted by radiology.  Labs Reviewed  URINALYSIS, ROUTINE W REFLEX MICROSCOPIC - Abnormal; Notable for the following:       Result Value   Ketones, ur 15 (*)    Leukocytes, UA TRACE (*)    All other components within normal limits  CBC WITH DIFFERENTIAL/PLATELET - Abnormal; Notable for the following:    Neutro Abs 8.0 (*)    All other components within normal limits  BASIC METABOLIC PANEL - Abnormal; Notable for the following:    Potassium 3.3 (*)    Chloride 100 (*)    Glucose, Bld 101 (*)    All other components within normal limits  URINALYSIS, MICROSCOPIC (REFLEX) - Abnormal; Notable for the following:    Bacteria, UA RARE (*)    Squamous Epithelial / LPF 0-5 (*)    All other components within normal limits  CULTURE, BLOOD (ROUTINE X 2)  CULTURE, BLOOD (ROUTINE X 2)  BODY FLUID CULTURE  GRAM STAIN  SEDIMENTATION RATE  C-REACTIVE PROTEIN  LACTIC ACID, PLASMA  GLUCOSE, SYNOVIAL FLUID  PROTEIN, SYNOVIAL FLUID  SYNOVIAL CELL COUNT + DIFF, W/ CRYSTALS    MR HIP LEFT W WO CONTRAST  Final Result    DG Hip Unilat W or Wo Pelvis 2-3 Views Left  Final Result    DG Chest 2 View    (Results Pending)  IR Radiologist Eval & Mgmt    (Results Pending)    No Follow-up on  file.    Merrily Pew, MD 08/25/16 [phone]

## 2016-08-25 NOTE — ED Triage Notes (Signed)
PT reports she has had on going LT hip and Lt leg pain . Pt has seen her PCP for this Hip /Leg pain. Pt does not take any pain meds for this pain. Pt denies any falls.

## 2016-08-25 NOTE — H&P (Signed)
[name] AYT:[id number] DOB: 03/23/1928 DOA: 08/25/2016     PCP: Colon Branch, MD   Outpatient Specialists: Einar Gip Patient coming from:  home Lives alone,        Chief Complaint: Hip Pain  HPI: [name] is a 83 y.o. female with medical history significant of a.fib on Eliquis, Parkinson disease, arthritis recurrent UTI    Presented with left hip and left leg pain since this AM.  She has been seen by her PCP for similar prior episodes in the past but states no one can figure it out.  This there was no trauma involved started while she was sleeping pain is severe and worse with movement pain radiates down to the foot worsened hip and groin region. She have had some frequent urination but no dysuria has history of recurrent UTIs.  No chest pain no shortness of breath.    Regarding pertinent Chronic problems: Atrial fibrillation fold cardiology on chronic anticoagulation   IN ER:  Temp (24hrs), Avg:99.9 F (37.7 C), Min:98.8 F (37.1 C), Max:101.7 F (38.7 C)     RR 23 7099% at bedtime 79 BP 121/83 WBC 9.7 Hg 12.4 Plt 176 Na 140 K 3.3 BUN 10 Cr 0.7 Sed rate 12 Lactic acid 1.4 CXR mild bibasilar opacities likely atelectasis MRI of the head no fracture or osteoarthritis small left hip effusion tendinosis left  Following Medications were ordered in ER: Medications  acetaminophen (TYLENOL) tablet 1,000 mg (1,000 mg Oral Given 08/25/16 0958)  morphine 4 MG/ML injection 4 mg (4 mg Intravenous Given 08/25/16 0959)  sodium chloride 0.9 % bolus 1,000 mL (0 mLs Intravenous Stopped 08/25/16 1043)  LORazepam (ATIVAN) injection 1 mg (1 mg Intravenous Given 08/25/16 1352)  potassium chloride SA (K-DUR,KLOR-CON) CR tablet 40 mEq (40 mEq Oral Given 08/25/16 1040)  gadobenate dimeglumine (MULTIHANCE) injection 15 mL (15 mLs Intravenous Contrast Given 08/25/16 1500)     ER provider discussed case with:  Orthopedics who recommended hip aspiration interventional radiology recommended  holding adequate for 48 hours prior to attempting hip aspiration, cardiology who recommended heparin drip while bridging  Hospitalist was called for admission for Hip Pain and fever  Review of Systems:    Pertinent positives include:  non-productive cough,  Constitutional:  No weight loss, night sweats, Fevers, chills, fatigue, weight loss  HEENT:  No headaches, Difficulty swallowing,Tooth/dental problems,Sore throat,  No sneezing, itching, ear ache, nasal congestion, post nasal drip,  Cardio-vascular:  No chest pain, Orthopnea, PND, anasarca, dizziness, palpitations.no Bilateral lower extremity swelling  GI:  No heartburn, indigestion, abdominal pain, nausea, vomiting, diarrhea, change in bowel habits, loss of appetite, melena, blood in stool, hematemesis Resp:  no shortness of breath at rest. No dyspnea on exertion, No excess mucus, no productive cough, No No coughing up of blood.No change in color of mucus.No wheezing. Skin:  no rash or lesions. No jaundice GU:  no dysuria, change in color of urine, no urgency or frequency. No straining to urinate.  No flank pain.  Musculoskeletal:  No joint pain or no joint swelling. No decreased range of motion. No back pain.  Psych:  No change in mood or affect. No depression or anxiety. No memory loss.  Neuro: no localizing neurological complaints, no tingling, no weakness, no double vision, no gait abnormality, no slurred speech, no confusion  As per HPI otherwise 10 point review of systems negative.   Past Medical History: Past Medical History:  Diagnosis Date  . Anxiety   .  Arthritis   . Atrial fibrillation ([place])   . Cardiac arrhythmia due to congenital heart disease   . Chronic constipation   . COPD (chronic obstructive pulmonary disease) ([place])   . Depression   . GERD (gastroesophageal reflux disease)   . Hemorrhoid   . Hyperlipidemia   . Idiopathic hypotension   . Lightheadedness 11/2015  . Mycotic toenails 10/27/2012  .  Parkinson disease ([place])   . Recurrent UTI   . 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 Varicose veins    Past Surgical History:  Procedure Laterality Date  . APPENDECTOMY  83 years old  . COLON RESECTION  2008  . vitriectomy  08-2015     Social History:  Ambulatory     cane, and stair lift    reports that she quit smoking about 36 years ago. Her smoking use included Cigarettes. She has a 60.00 pack-year smoking history. She has never used smokeless tobacco. She reports that she does not drink alcohol or use drugs.  Allergies:   Allergies  Allergen Reactions  . Adhesive [Tape] Other (See Comments)    Caused blisters  . Amoxicillin Other (See Comments)    Has patient had a PCN reaction causing immediate rash, facial/tongue/throat swelling, SOB or lightheadedness with hypotension: Yes Has patient had a PCN reaction causing severe rash involving mucus membranes or skin necrosis: No Has patient had a PCN reaction that required hospitalization No Has patient had a PCN reaction occurring within the last 10 years: Yes If all of the above answers are "NO", then may proceed with Cephalosporin use.   Caused thrush  . Ciprofloxacin Other (See Comments)    Caused thrush  . Ultram [Tramadol] Other (See Comments)    hypotension       Family History:   Family History  Problem Relation Age of Onset  . Asthma Brother   . Alcohol abuse Brother   . Throat cancer Father   . Alcohol abuse Father   . Lupus Daughter   . Bipolar disorder Daughter   . Lupus Daughter   . Anxiety disorder Daughter   . Alcohol abuse Sister   . Alcohol abuse Maternal Grandfather   . Alcohol abuse Paternal Grandmother   . Breast cancer Sister   . Colon cancer Neg Hx     Medications: Prior to Admission medications   Medication Sig Start Date End Date Taking? Authorizing Provider  apixaban (ELIQUIS) 5 MG TABS tablet Take 5 mg by mouth 2 (two) times daily.   Yes [provider]  Ascorbic Acid  (VITAMIN C) 1000 MG tablet Take 1,000 mg by mouth daily.   Yes [provider]  azelastine (ASTELIN) 0.1 % nasal spray Place 2 sprays into both nostrils at bedtime as needed for rhinitis. Use in each nostril as directed 07/14/16  Yes Saguier, Percell Miller, PA-C  cholecalciferol (VITAMIN D) 1000 units tablet Take 1,000 Units by mouth daily.   Yes [provider]  DULoxetine (CYMBALTA) 30 MG capsule Take 1 capsule (30 mg total) by mouth daily. 08/18/16  Yes Paz, Alda Berthold, MD  fludrocortisone (FLORINEF) 0.1 MG tablet Take 0.1 mg by mouth daily.   Yes [provider]  polyethylene glycol powder (GLYCOLAX/MIRALAX) powder Take 17 g by mouth 2 (two) times daily. Patient taking differently: Take 17 g by mouth daily.  05/28/16  Yes Waynetta Pean, PA-C  Propylene Glycol (SYSTANE BALANCE OP) Apply 2 drops to eye 3 (three) times daily as needed.   Yes [provider]  SYMBICORT 160-4.5 MCG/ACT inhaler INHALE 2 PUFFS BY MOUTH INTO THE LUNGS TWICE DAILY. 11/09/15  Yes Brunetta Jeans, PA-C  albuterol (VENTOLIN HFA) 108 (90 BASE) MCG/ACT inhaler Inhale 2 puffs into the lungs 2 (two) times daily as needed for wheezing or shortness of breath. 02/05/15   Midge Minium, MD  diltiazem (CARDIZEM) 30 MG tablet Take 1 tablet (30 mg total) by mouth 4 (four) times daily. Take at onset on atrial fibrillation 02/02/15   Tat, David, MD  fluticasone [facility]) 50 MCG/ACT nasal spray Place 2 sprays into both nostrils daily. Patient not taking: Reported on 08/25/2016 07/14/16   Saguier, Percell Miller, PA-C  meclizine (ANTIVERT) 25 MG tablet Take 1 or 2 po Q 6hrs for dizziness Patient not taking: Reported on 08/25/2016 11/24/15   Rolland Porter, MD    Physical Exam: Patient Vitals for the past 24 hrs:  BP Temp Temp src Pulse Resp SpO2 Height Weight  08/25/16 1815 121/83 - - 79 (!) 23 99 % - -  08/25/16 1630 110/60 - - 92 (!) 22 96 % - -  08/25/16 1343 - 98.8 F (37.1 C) Oral - - - - -  08/25/16 1315 123/76 - -  80 (!) 23 95 % - -  08/25/16 1215 124/83 - - 90 20 92 % - -  08/25/16 1134 - 99.4 F (37.4 C) Oral - - - - -  08/25/16 1115 135/86 - - 89 (!) 22 92 % - -  08/25/16 1015 (!) 144/89 - - 90 (!) 21 93 % - -  08/25/16 0933 - (!) 101.7 F (38.7 C) Rectal - - - - -  08/25/16 0933 137/90 - - 82 (!) 22 96 % - -  08/25/16 0906 126/80 99.7 F (37.6 C) Oral (!) 104 18 95 % - -  08/25/16 0903 - - - - - - '5\' 10"'$  (1.778 m) 70.3 kg (155 lb)    1. General:  in No Acute distress 2. Psychological: Alert and   Oriented 3. Head/ENT:    Dry Mucous Membranes                          Head Non traumatic, neck supple                          Normal   Dentition 4. SKIN:   decreased Skin turgor,  Skin clean Dry and intact no rash 5. Heart: Regular rate and rhythm no Murmur, Rub or gallop 6. Lungs: Clear to auscultation bilaterally, no wheezes or crackles   7. Abdomen: Soft, non-tender, Non distended 8. Lower extremities: no clubbing, cyanosis, or edema 9. Neurologically Grossly intact, moving all 4 extremities equally  But limited due to pain on the left hip 10. MSK: Normal range of motion   body mass index is 22.24 kg/m.  Labs on Admission:   Labs on Admission: I have personally reviewed following labs and imaging studies  CBC:  Recent Labs Lab 08/25/16 0942  WBC 9.7  NEUTROABS 8.0*  HGB 12.4  HCT 38.5  MCV 92.5  PLT 295   Basic Metabolic Panel:  Recent Labs Lab 08/25/16 0942  NA 140  K 3.3*  CL 100*  CO2 30  GLUCOSE 101*  BUN 10  CREATININE 0.70  CALCIUM 9.1   GFR: Estimated Creatinine Clearance: 54.6 mL/min (by C-G formula based on SCr of 0.7 mg/dL). Liver Function Tests: No results for input(s):  AST, ALT, ALKPHOS, BILITOT, PROT, ALBUMIN in the last 168 hours. No results for input(s): LIPASE, AMYLASE in the last 168 hours. No results for input(s): AMMONIA in the last 168 hours. Coagulation Profile: No results for input(s): INR, PROTIME in the last 168 hours. Cardiac  Enzymes: No results for input(s): CKTOTAL, CKMB, CKMBINDEX, TROPONINI in the last 168 hours. BNP (last 3 results) No results for input(s): PROBNP in the last 8760 hours. HbA1C: No results for input(s): HGBA1C in the last 72 hours. CBG: No results for input(s): GLUCAP in the last 168 hours. Lipid Profile: No results for input(s): CHOL, HDL, LDLCALC, TRIG, CHOLHDL, LDLDIRECT in the last 72 hours. Thyroid Function Tests: No results for input(s): TSH, T4TOTAL, FREET4, T3FREE, THYROIDAB in the last 72 hours. Anemia Panel: No results for input(s): VITAMINB12, FOLATE, FERRITIN, TIBC, IRON, RETICCTPCT in the last 72 hours. Urine analysis:    Component Value Date/Time   COLORURINE YELLOW 08/25/2016 1136   APPEARANCEUR CLEAR 08/25/2016 1136   LABSPEC 1.015 08/25/2016 1136   PHURINE 7.5 08/25/2016 1136   GLUCOSEU NEGATIVE 08/25/2016 1136   GLUCOSEU NEGATIVE 03/31/2016 1137   HGBUR NEGATIVE 08/25/2016 1136   BILIRUBINUR NEGATIVE 08/25/2016 1136   BILIRUBINUR neg 06/19/2016 0950   KETONESUR 15 (A) 08/25/2016 1136   PROTEINUR NEGATIVE 08/25/2016 1136   UROBILINOGEN negative 06/19/2016 0950   UROBILINOGEN 0.2 03/31/2016 1137   NITRITE NEGATIVE 08/25/2016 1136   LEUKOCYTESUR TRACE (A) 08/25/2016 1136   Sepsis Labs: '@LABRCNTIP'$ (procalcitonin:4,lacticidven:4) )No results found for this or any previous visit (from the past 240 hour(s)).    UA   no evidence of UTI     Lab Results  Component Value Date   HGBA1C 5.8 06/19/2016    Estimated Creatinine Clearance: 54.6 mL/min (by C-G formula based on SCr of 0.7 mg/dL).  BNP (last 3 results) No results for input(s): PROBNP in the last 8760 hours.   ECG REPORT  Independently reviewed Rate: 88  Rhythm: A. FIB ST&T Change: No acute ischemic changes  QTC  469  Filed Weights   08/25/16 0903  Weight: 70.3 kg (155 lb)     Cultures:    Component Value Date/Time   SDES URINE, RANDOM 11/22/2015 1335   SPECREQUEST NONE 11/22/2015 1335    CULT (A) 11/22/2015 1335    <10,000 COLONIES/mL INSIGNIFICANT GROWTH Performed at [place] 11/23/2015 FINAL 11/22/2015 1335     Radiological Exams on Admission: Dg Chest 2 View  Result Date: 08/25/2016 CLINICAL DATA:  Fever.  Abdominal pain. EXAM: CHEST  2 VIEW COMPARISON:  07/23/2016 FINDINGS: The cardiac silhouette remains mildly enlarged. Aortic atherosclerosis is noted. There is mild chronic interstitial coarsening. The lungs are hyperinflated on the lateral radiograph, although the patient has taken a shallower inspiration on the frontal projection. There is focal mild opacity in the lateral right lung base on the frontal projection, and there is mild curvilinear opacity in the left lung base. No sizable pleural effusion or pneumothorax is identified. No acute osseous abnormality is seen. IMPRESSION: Mild bibasilar opacities, likely atelectasis though early infection is not excluded. Electronically Signed   By: Logan Bores M.D.   On: 08/25/2016 16:58   Mr Hip Left W Wo Contrast  Result Date: 08/25/2016 CLINICAL DATA:  Left leg pain, unable to walk EXAM: MRI OF THE LEFT HIP WITHOUT AND WITH CONTRAST TECHNIQUE: Multiplanar, multisequence MR imaging was performed both before and after administration of intravenous contrast. CONTRAST:  75m MULTIHANCE GADOBENATE DIMEGLUMINE 529 MG/ML IV  SOLN COMPARISON:  None. FINDINGS: Bones: No hip fracture, dislocation or avascular necrosis. No periosteal reaction or bone destruction. No aggressive osseous lesion. Normal sacrum and sacroiliac joints. No SI joint widening or erosive changes. Degenerative disc disease with disc height loss at L3-4, L4-5 and L5-S1. Articular cartilage and labrum Articular cartilage: High-grade partial-thickness cartilage loss of the left femoral head and acetabulum. Partial-thickness cartilage loss of the right femoral head and acetabulum. Bilateral inferior femoral head marginal osteophytes. Labrum: Grossly  intact, but evaluation is limited by lack of intraarticular fluid. Joint or bursal effusion Joint effusion: Small left hip joint effusion. No right hip joint effusion. No SI joint effusion. Bursae:  No bursa formation. Muscles and tendons Flexors: Normal. Extensors: Normal. Abductors: Normal. Adductors: Normal. Gluteals: Normal. Hamstrings: Mild tendinosis of the left hamstring origin. Small partial tear of the right hamstring origin. Other findings Miscellaneous: No pelvic free fluid. No fluid collection or hematoma. No inguinal lymphadenopathy. No inguinal hernia. Incidental note made of a left pelvic kidney. IMPRESSION: 1. No hip fracture, dislocation or avascular necrosis. 2. Moderate osteoarthritis of bilateral hips, left greater than right. Small left hip joint effusion. 3. Mild tendinosis of the left hamstring origin. 4. Small partial tear of the right hamstring origin. 5. Degenerative disc disease with disc height loss at L3-4, L4-5 and L5-S1. Electronically Signed   By: Kathreen Devoid   On: 08/25/2016 15:13   Dg Hip Unilat W Or Wo Pelvis 2-3 Views Left  Result Date: 08/25/2016 CLINICAL DATA:  Chronic left hip pain EXAM: DG HIP (WITH OR WITHOUT PELVIS) 2-3V LEFT COMPARISON:  None. FINDINGS: Moderate osteoarthritic changes in the hips bilaterally with joint space narrowing and spurring. SI joints are symmetric and unremarkable. No acute bony abnormality. Specifically, no fracture, subluxation, or dislocation. Soft tissues are intact. IMPRESSION: Moderate symmetric degenerative changes in the hips bilaterally. No acute findings. Electronically Signed   By: Rolm Baptise M.D.   On: 08/25/2016 09:56    Chart has been reviewed    Assessment/Plan  84 y.o. female with medical history significant of a.fib on Eliquis, Parkinson disease, arthritis recurrent UTI admitted for hip pain and associated fever unclear etiology     Present on Admission: . Atrial fibrillation with RVR (HCC) -           -  CHA2DS2 vas score 4 : hold Eliquis for procedure and bridge with heparin as per cardiology           -  Rate control:  Currently controlled with   Diltiazem will continue         . COPD (chronic obstructive pulmonary disease) ([facility]) chronic stable continue home medications given abnormal chest x-ray will repeat in 24 hours in case patient had early pneumonia she have had intermittent coughing and fever . Parkinson's disease ([place]) stable minimally symptomatic . Hypokalemia replace check magnesium level . Fever unclear etiology given slightly abnormal chest x-ray will repeat in 24 hours no evidence of sepsis will hold off on IV antibiotics obtain viral panel   Other plan as per orders.  DVT prophylaxis:  heparin   Code Status:  FULL CODE   as per patient    Family Communication:   Family not  at  Bedside    Disposition Plan:      To home once workup is complete and patient is stable  Would benefit from PT/OT eval prior to [place] [facility] ID COMPLETE                                               Consults called: IR aware, discussed with cardiology  Admission status:   obs   Level of care     medical floor       I have spent a total of 66 min on this admission  extra time was spent to discuss case with cardiology  [place] 08/25/2016, 10:04 PM    [facility]  Pager [phone]   after 2 AM please page floor coverage PA If 7AM-7PM, please contact the day team taking care of the patient  [email or web address]  Password [email or web address]

## 2016-08-26 ENCOUNTER — Observation Stay (HOSPITAL_COMMUNITY): Payer: Medicare Other

## 2016-08-26 DIAGNOSIS — Q248 Other specified congenital malformations of heart: Secondary | ICD-10-CM | POA: Diagnosis not present

## 2016-08-26 DIAGNOSIS — Z881 Allergy status to other antibiotic agents status: Secondary | ICD-10-CM | POA: Diagnosis not present

## 2016-08-26 DIAGNOSIS — R41841 Cognitive communication deficit: Secondary | ICD-10-CM | POA: Diagnosis not present

## 2016-08-26 DIAGNOSIS — K5900 Constipation, unspecified: Secondary | ICD-10-CM | POA: Diagnosis present

## 2016-08-26 DIAGNOSIS — F329 Major depressive disorder, single episode, unspecified: Secondary | ICD-10-CM | POA: Diagnosis present

## 2016-08-26 DIAGNOSIS — R2681 Unsteadiness on feet: Secondary | ICD-10-CM | POA: Diagnosis not present

## 2016-08-26 DIAGNOSIS — F419 Anxiety disorder, unspecified: Secondary | ICD-10-CM | POA: Diagnosis present

## 2016-08-26 DIAGNOSIS — R509 Fever, unspecified: Secondary | ICD-10-CM | POA: Diagnosis not present

## 2016-08-26 DIAGNOSIS — J9811 Atelectasis: Secondary | ICD-10-CM | POA: Diagnosis present

## 2016-08-26 DIAGNOSIS — M6281 Muscle weakness (generalized): Secondary | ICD-10-CM | POA: Diagnosis not present

## 2016-08-26 DIAGNOSIS — Z885 Allergy status to narcotic agent status: Secondary | ICD-10-CM | POA: Diagnosis not present

## 2016-08-26 DIAGNOSIS — M1990 Unspecified osteoarthritis, unspecified site: Secondary | ICD-10-CM | POA: Diagnosis present

## 2016-08-26 DIAGNOSIS — E876 Hypokalemia: Secondary | ICD-10-CM | POA: Diagnosis not present

## 2016-08-26 DIAGNOSIS — R278 Other lack of coordination: Secondary | ICD-10-CM | POA: Diagnosis not present

## 2016-08-26 DIAGNOSIS — M25552 Pain in left hip: Secondary | ICD-10-CM | POA: Diagnosis not present

## 2016-08-26 DIAGNOSIS — J440 Chronic obstructive pulmonary disease with acute lower respiratory infection: Secondary | ICD-10-CM | POA: Diagnosis present

## 2016-08-26 DIAGNOSIS — J42 Unspecified chronic bronchitis: Secondary | ICD-10-CM | POA: Diagnosis not present

## 2016-08-26 DIAGNOSIS — J449 Chronic obstructive pulmonary disease, unspecified: Secondary | ICD-10-CM | POA: Diagnosis present

## 2016-08-26 DIAGNOSIS — J189 Pneumonia, unspecified organism: Secondary | ICD-10-CM | POA: Diagnosis not present

## 2016-08-26 DIAGNOSIS — G20 Parkinson's disease: Secondary | ICD-10-CM | POA: Diagnosis not present

## 2016-08-26 DIAGNOSIS — I480 Paroxysmal atrial fibrillation: Secondary | ICD-10-CM | POA: Diagnosis not present

## 2016-08-26 DIAGNOSIS — R0602 Shortness of breath: Secondary | ICD-10-CM | POA: Diagnosis not present

## 2016-08-26 DIAGNOSIS — I4891 Unspecified atrial fibrillation: Secondary | ICD-10-CM | POA: Diagnosis not present

## 2016-08-26 DIAGNOSIS — D649 Anemia, unspecified: Secondary | ICD-10-CM | POA: Diagnosis present

## 2016-08-26 DIAGNOSIS — Z880 Allergy status to penicillin: Secondary | ICD-10-CM | POA: Diagnosis not present

## 2016-08-26 DIAGNOSIS — M25559 Pain in unspecified hip: Secondary | ICD-10-CM | POA: Diagnosis not present

## 2016-08-26 DIAGNOSIS — G629 Polyneuropathy, unspecified: Secondary | ICD-10-CM | POA: Diagnosis present

## 2016-08-26 DIAGNOSIS — Z91048 Other nonmedicinal substance allergy status: Secondary | ICD-10-CM | POA: Diagnosis not present

## 2016-08-26 DIAGNOSIS — R001 Bradycardia, unspecified: Secondary | ICD-10-CM | POA: Diagnosis present

## 2016-08-26 DIAGNOSIS — I951 Orthostatic hypotension: Secondary | ICD-10-CM | POA: Diagnosis present

## 2016-08-26 DIAGNOSIS — Z87891 Personal history of nicotine dependence: Secondary | ICD-10-CM | POA: Diagnosis not present

## 2016-08-26 DIAGNOSIS — Z7901 Long term (current) use of anticoagulants: Secondary | ICD-10-CM | POA: Diagnosis not present

## 2016-08-26 LAB — MAGNESIUM: Magnesium: 1.8 mg/dL (ref 1.7–2.4)

## 2016-08-26 LAB — CBC
HEMATOCRIT: 34.7 % — AB (ref 36.0–46.0)
Hemoglobin: 11.5 g/dL — ABNORMAL LOW (ref 12.0–15.0)
MCH: 30.1 pg (ref 26.0–34.0)
MCHC: 33.1 g/dL (ref 30.0–36.0)
MCV: 90.8 fL (ref 78.0–100.0)
Platelets: 152 10*3/uL (ref 150–400)
RBC: 3.82 MIL/uL — ABNORMAL LOW (ref 3.87–5.11)
RDW: 15.4 % (ref 11.5–15.5)
WBC: 7.4 10*3/uL (ref 4.0–10.5)

## 2016-08-26 LAB — COMPREHENSIVE METABOLIC PANEL
ALBUMIN: 3.2 g/dL — AB (ref 3.5–5.0)
ALT: 16 U/L (ref 14–54)
AST: 21 U/L (ref 15–41)
Alkaline Phosphatase: 68 U/L (ref 38–126)
Anion gap: 8 (ref 5–15)
BUN: 9 mg/dL (ref 6–20)
CHLORIDE: 103 mmol/L (ref 101–111)
CO2: 27 mmol/L (ref 22–32)
Calcium: 8.7 mg/dL — ABNORMAL LOW (ref 8.9–10.3)
Creatinine, Ser: 0.68 mg/dL (ref 0.44–1.00)
GFR calc Af Amer: >60 mL/min (ref >60)
GFR calc non Af Amer: >60 mL/min (ref >60)
GLUCOSE: 91 mg/dL (ref 65–99)
POTASSIUM: 3.3 mmol/L — AB (ref 3.5–5.1)
Sodium: 138 mmol/L (ref 135–145)
Total Bilirubin: 1.4 mg/dL — ABNORMAL HIGH (ref 0.3–1.2)
Total Protein: 5.8 g/dL — ABNORMAL LOW (ref 6.5–8.1)

## 2016-08-26 LAB — RESPIRATORY PANEL BY PCR
ADENOVIRUS-RVPPCR: NOT DETECTED
Bordetella pertussis: NOT DETECTED
CORONAVIRUS 229E-RVPPCR: NOT DETECTED
CORONAVIRUS HKU1-RVPPCR: NOT DETECTED
CORONAVIRUS NL63-RVPPCR: NOT DETECTED
CORONAVIRUS OC43-RVPPCR: NOT DETECTED
Chlamydophila pneumoniae: NOT DETECTED
INFLUENZA A-RVPPCR: NOT DETECTED
Influenza B: NOT DETECTED
MYCOPLASMA PNEUMONIAE-RVPPCR: NOT DETECTED
Metapneumovirus: NOT DETECTED
PARAINFLUENZA VIRUS 1-RVPPCR: NOT DETECTED
PARAINFLUENZA VIRUS 4-RVPPCR: NOT DETECTED
Parainfluenza Virus 2: NOT DETECTED
Parainfluenza Virus 3: NOT DETECTED
Respiratory Syncytial Virus: NOT DETECTED
Rhinovirus / Enterovirus: NOT DETECTED

## 2016-08-26 LAB — APTT
APTT: 39 s — AB (ref 24–36)
aPTT: 62 seconds — ABNORMAL HIGH (ref 24–36)
aPTT: 72 seconds — ABNORMAL HIGH (ref 24–36)

## 2016-08-26 LAB — TSH: TSH: 2.061 u[IU]/mL (ref 0.350–4.500)

## 2016-08-26 LAB — PHOSPHORUS: Phosphorus: 2.3 mg/dL — ABNORMAL LOW (ref 2.5–4.6)

## 2016-08-26 LAB — HEPARIN LEVEL (UNFRACTIONATED): Heparin Unfractionated: 0.93 IU/mL — ABNORMAL HIGH (ref 0.30–0.70)

## 2016-08-26 MED ORDER — IPRATROPIUM BROMIDE 0.02 % IN SOLN: 0.5000 mg | Freq: Four times a day (QID) | RESPIRATORY_TRACT | Status: DC | PRN

## 2016-08-26 MED ORDER — ENSURE ENLIVE PO LIQD
237.0000 mL | Freq: Two times a day (BID) | ORAL | Status: DC
  Administered 2016-08-27 – 2016-08-29 (×4): 237 mL via ORAL

## 2016-08-26 MED ORDER — SODIUM PHOSPHATES 45 MMOLE/15ML IV SOLN
30.0000 mmol | Freq: Once | INTRAVENOUS | Status: DC
  Filled 2016-08-26: qty 10

## 2016-08-26 MED ORDER — POTASSIUM CHLORIDE CRYS ER 20 MEQ PO TBCR
40.0000 meq | EXTENDED_RELEASE_TABLET | Freq: Two times a day (BID) | ORAL | Status: AC
  Administered 2016-08-26 (×2): 40 meq via ORAL
  Filled 2016-08-26 (×2): qty 2

## 2016-08-26 MED ORDER — DEXTROSE 5 % IV SOLN
1.0000 g | INTRAVENOUS | Status: DC
  Administered 2016-08-26 – 2016-08-28 (×3): 1 g via INTRAVENOUS
  Filled 2016-08-26 (×5): qty 10

## 2016-08-26 MED ORDER — DEXTROSE 5 % IV SOLN
500.0000 mg | INTRAVENOUS | Status: DC
  Administered 2016-08-26 – 2016-08-28 (×3): 500 mg via INTRAVENOUS
  Filled 2016-08-26 (×5): qty 500

## 2016-08-26 MED ORDER — HEPARIN (PORCINE) IN NACL 100-0.45 UNIT/ML-% IJ SOLN
1150.0000 [IU]/h | INTRAMUSCULAR | Status: DC
  Administered 2016-08-26: 1150 [IU]/h via INTRAVENOUS
  Filled 2016-08-26: qty 250

## 2016-08-26 NOTE — Consult Note (Signed)
CARDIOLOGY CONSULT NOTE  Patient ID: [name] MRN: [id number] DOB/AGE: [birth year] 83 y.o.  Admit date: 08/25/2016 Referring Physician  Hosie Poisson, MD Primary Physician:  Colon Branch, MD Reason for Consultation  A. Fib  HPI: [name]  is a 83 y.o. female  With His history of paroxysmal atrial fibrillation and mild orthostatic hypotension, but otherwise.  No other cardiac history.  She has recently also been diagnosed with mild Parkinsonian disease and has GERD.  She is admitted to the hospital with left hip pain.  She was also found to be mildly febrile, and it was questioned whether there was any infection in the joint due to fluid collection.  Hence hard Eliquis was held and she is presently on IV heparin for possible aspiration of the joint.  Patient had multiple questions and patient had atrial fibrillation with RVR, I was asked to come and see the patient.  Patient is presently doing well.  Previously she did not tolerate atrial fibrillation well and would complain of marked dyspnea and fatigue.  Presently except for hip pain, mild chronic dyspnea, she denies any other new symptoms.  Past Medical History:  Diagnosis Date  . Anxiety   . Arthritis   . Atrial fibrillation ([place])   . Cardiac arrhythmia due to congenital heart disease   . Chronic constipation   . COPD (chronic obstructive pulmonary disease) ([place])   . Depression   . GERD (gastroesophageal reflux disease)   . Hemorrhoid   . Hyperlipidemia   . Idiopathic hypotension   . Lightheadedness 11/2015  . Mycotic toenails 10/27/2012  . Parkinson disease ([place])   . Recurrent UTI   . 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 Varicose veins      Past Surgical History:  Procedure Laterality Date  . APPENDECTOMY  83 years old  . COLON RESECTION  2008  . vitriectomy  08-2015     Family History  Problem Relation Age of Onset  . Asthma Brother   . Alcohol abuse Brother   . Throat cancer Father   . Alcohol  abuse Father   . Lupus Daughter   . Bipolar disorder Daughter   . Lupus Daughter   . Anxiety disorder Daughter   . Alcohol abuse Sister   . Alcohol abuse Maternal Grandfather   . Alcohol abuse Paternal Grandmother   . Breast cancer Sister   . Colon cancer Neg Hx      Social History: Social History   Social History  . Marital status: Widowed    Spouse name: N/A  . Number of children: 2  . Years of education: N/A   Occupational History  . retired- Ambulance person , elementary school [employer]  . Smoking status: Former Smoker    Packs/day: 2.00    Years: 30.00    Types: Cigarettes    Quit date: 04/21/1980  . Smokeless tobacco: Never Used     Comment: onset age 80 -55, up to > 1ppd (almost 2 ppd)  . Alcohol use No  . Drug use: No  . Sexual activity: No   Other Topics Concern  . Not on file   Social History Narrative   Widowed, lives alone. 1 child in [place] and 1 in [place]. 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     Prescriptions Prior to Admission  Medication Sig Dispense Refill Last Dose  . apixaban (ELIQUIS) 5 MG TABS tablet Take 5  mg by mouth 2 (two) times daily.   08/24/2016 at 1900  . Ascorbic Acid (VITAMIN C) 1000 MG tablet Take 1,000 mg by mouth daily.   08/24/2016 at Unknown time  . azelastine (ASTELIN) 0.1 % nasal spray Place 2 sprays into both nostrils at bedtime as needed for rhinitis. Use in each nostril as directed 30 mL 3 Past Month at Unknown time  . cholecalciferol (VITAMIN D) 1000 units tablet Take 1,000 Units by mouth daily.   08/24/2016 at Unknown time  . DULoxetine (CYMBALTA) 30 MG capsule Take 1 capsule (30 mg total) by mouth daily. 30 capsule 3 Past Week at Unknown time  . fludrocortisone (FLORINEF) 0.1 MG tablet Take 0.1 mg by mouth daily.   08/24/2016 at Unknown time  . polyethylene glycol powder (GLYCOLAX/MIRALAX) powder Take 17 g by mouth 2 (two) times daily. (Patient taking differently: Take 17 g by mouth daily. ) 255 g  0 08/24/2016 at Unknown time  . Propylene Glycol (SYSTANE BALANCE OP) Apply 2 drops to eye 3 (three) times daily as needed.   Past Week at Unknown time  . SYMBICORT 160-4.5 MCG/ACT inhaler INHALE 2 PUFFS BY MOUTH INTO THE LUNGS TWICE DAILY. 3 Inhaler 3 Past Week at Unknown time  . albuterol (VENTOLIN HFA) 108 (90 BASE) MCG/ACT inhaler Inhale 2 puffs into the lungs 2 (two) times daily as needed for wheezing or shortness of breath. 18 g 4 rescue  . diltiazem (CARDIZEM) 30 MG tablet Take 1 tablet (30 mg total) by mouth 4 (four) times daily. Take at onset on atrial fibrillation 30 tablet 0 rescue  . fluticasone (FLONASE) 50 MCG/ACT nasal spray Place 2 sprays into both nostrils daily. (Patient not taking: Reported on 08/25/2016) 16 g 1 Not Taking at Unknown time  . meclizine (ANTIVERT) 25 MG tablet Take 1 or 2 po Q 6hrs for dizziness (Patient not taking: Reported on 08/25/2016) 40 tablet 0 Not Taking at Unknown time     Review of Systems - Negative except Chronic mild dyspnea, hip pain, occasional dizziness and chronic fatigue.  No chest pain, no recent weight changes, no leg edema.  No syncope.  No neurologic deficit.  Denies any fever, chills.    Physical Exam: Blood pressure 114/73, pulse 78, temperature 97.9 F (36.6 C), temperature source Oral, resp. rate 18, height '5\' 10"'$  (1.778 m), weight 70.3 kg (155 lb), SpO2 95 %.   General appearance: alert, cooperative, appears stated age and no distress Lungs: clear to auscultation bilaterally and barrel shaped chest Chest wall: no tenderness Heart: irregularly irregular rhythm and Distant heart sounds.  S1 is  variable, S2 is normal.  No murmur appreciated. Abdomen: soft, non-tender; bowel sounds normal; no masses,  no organomegaly Extremities: extremities normal, atraumatic, no cyanosis or edema and Complete examination of the musculoskeletal system not performed. Pulses: 2+ and symmetric Neurologic: Grossly normal  Labs:  Lab Results  Component Value  Date   WBC 7.4 08/26/2016   HGB 11.5 (L) 08/26/2016   HCT 34.7 (L) 08/26/2016   MCV 90.8 08/26/2016   PLT 152 08/26/2016    Recent Labs Lab 08/26/16 0345  NA 138  K 3.3*  CL 103  CO2 27  BUN 9  CREATININE 0.68  CALCIUM 8.7*  PROT 5.8*  BILITOT 1.4*  ALKPHOS 68  ALT 16  AST 21  GLUCOSE 91    Lipid Panel     Component Value Date/Time   CHOL 144 10/03/2015 1617   TRIG 111.0 10/03/2015 1617  HDL 62.00 10/03/2015 1617   CHOLHDL 2 10/03/2015 1617   VLDL 22.2 10/03/2015 1617   LDLCALC 59 10/03/2015 1617   Cardiac Panel (last 3 results)  Recent Labs  11/22/15 1320 11/24/15 1221  TROPONINI 0.04* <0.03    Lab Results  Component Value Date   CKTOTAL 61 03/26/2011   CKMB 2.8 03/26/2011   TROPONINI <0.03 11/24/2015     TSH  Recent Labs  12/12/15 1215 01/31/16 1143 08/26/16 0345  TSH 4.44 3.50 2.061   Radiology: Dg Chest 2 View  Result Date: 08/26/2016 CLINICAL DATA:  Low grade fever. EXAM: CHEST  2 VIEW COMPARISON:  Yesterday FINDINGS: Chronic cardiomegaly. Stable aortic and hilar contours. Hyperinflation with mild streaky density at the bases, subtly increased based on the lateral view. No edema, effusion, or pneumothorax. IMPRESSION: Mild atelectasis at the bases with superimposed infection not excluded. Borderline increased from yesterday. Cardiomegaly. COPD. Electronically Signed   By: Monte Fantasia M.D.   On: 08/26/2016 08:12   Dg Chest 2 View  Result Date: 08/25/2016 CLINICAL DATA:  Fever.  Abdominal pain. EXAM: CHEST  2 VIEW COMPARISON:  07/23/2016 FINDINGS: The cardiac silhouette remains mildly enlarged. Aortic atherosclerosis is noted. There is mild chronic interstitial coarsening. The lungs are hyperinflated on the lateral radiograph, although the patient has taken a shallower inspiration on the frontal projection. There is focal mild opacity in the lateral right lung base on the frontal projection, and there is mild curvilinear opacity in the left  lung base. No sizable pleural effusion or pneumothorax is identified. No acute osseous abnormality is seen. IMPRESSION: Mild bibasilar opacities, likely atelectasis though early infection is not excluded. Electronically Signed   By: Logan Bores M.D.   On: 08/25/2016 16:58   Mr Hip Left W Wo Contrast  Result Date: 08/25/2016 CLINICAL DATA:  Left leg pain, unable to walk EXAM: MRI OF THE LEFT HIP WITHOUT AND WITH CONTRAST TECHNIQUE: Multiplanar, multisequence MR imaging was performed both before and after administration of intravenous contrast. CONTRAST:  73m MULTIHANCE GADOBENATE DIMEGLUMINE 529 MG/ML IV SOLN COMPARISON:  None. FINDINGS: Bones: No hip fracture, dislocation or avascular necrosis. No periosteal reaction or bone destruction. No aggressive osseous lesion. Normal sacrum and sacroiliac joints. No SI joint widening or erosive changes. Degenerative disc disease with disc height loss at L3-4, L4-5 and L5-S1. Articular cartilage and labrum Articular cartilage: High-grade partial-thickness cartilage loss of the left femoral head and acetabulum. Partial-thickness cartilage loss of the right femoral head and acetabulum. Bilateral inferior femoral head marginal osteophytes. Labrum: Grossly intact, but evaluation is limited by lack of intraarticular fluid. Joint or bursal effusion Joint effusion: Small left hip joint effusion. No right hip joint effusion. No SI joint effusion. Bursae:  No bursa formation. Muscles and tendons Flexors: Normal. Extensors: Normal. Abductors: Normal. Adductors: Normal. Gluteals: Normal. Hamstrings: Mild tendinosis of the left hamstring origin. Small partial tear of the right hamstring origin. Other findings Miscellaneous: No pelvic free fluid. No fluid collection or hematoma. No inguinal lymphadenopathy. No inguinal hernia. Incidental note made of a left pelvic kidney. IMPRESSION: 1. No hip fracture, dislocation or avascular necrosis. 2. Moderate osteoarthritis of bilateral hips,  left greater than right. Small left hip joint effusion. 3. Mild tendinosis of the left hamstring origin. 4. Small partial tear of the right hamstring origin. 5. Degenerative disc disease with disc height loss at L3-4, L4-5 and L5-S1. Electronically Signed   By: HKathreen Devoid  On: 08/25/2016 15:13   Dg Hip Unilat W Or  Wo Pelvis 2-3 Views Left  Result Date: 08/25/2016 CLINICAL DATA:  Chronic left hip pain EXAM: DG HIP (WITH OR WITHOUT PELVIS) 2-3V LEFT COMPARISON:  None. FINDINGS: Moderate osteoarthritic changes in the hips bilaterally with joint space narrowing and spurring. SI joints are symmetric and unremarkable. No acute bony abnormality. Specifically, no fracture, subluxation, or dislocation. Soft tissues are intact. IMPRESSION: Moderate symmetric degenerative changes in the hips bilaterally. No acute findings. Electronically Signed   By: Rolm Baptise M.D.   On: 08/25/2016 09:56    Scheduled Meds: . diltiazem  30 mg Oral Q6H  . DULoxetine  30 mg Oral Daily  . feeding supplement (ENSURE ENLIVE)  237 mL Oral BID BM  . fludrocortisone  0.1 mg Oral Daily  . guaiFENesin  600 mg Oral BID  . mometasone-formoterol  2 puff Inhalation BID  . potassium chloride  40 mEq Oral BID  . senna  1 tablet Oral BID   Continuous Infusions: . azithromycin Stopped (08/26/16 1441)  . cefTRIAXone (ROCEPHIN)  IV Stopped (08/26/16 1230)  . sodium phosphate  Dextrose 5% IVPB     PRN Meds:.acetaminophen **OR** acetaminophen, albuterol, HYDROcodone-acetaminophen, ipratropium, methocarbamol, ondansetron **OR** ondansetron (ZOFRAN) IV, polyethylene glycol  CARDIAC STUDIES:  Echocardiogram 02/02/2015: Normal LV systolic function, grade 2 diastolic dysfunction, minimal prolapse of the mitral valve, mild to moderate mitral stenosis, moderate pulmonary hypertension, PA pressure 48 mmHg.  EKG 08/25/2016: Atrial fibrillation with controlled ventricular response at the rate of 80 bpm, nonspecific T  abnormality.   ASSESSMENT AND PLAN:  1. Paroxysmal atrial fibrillation  CHA2DS2-VASc Score is 3.0 with yearly risk of stroke of 3.2 . 2.  Bradycardia with mild emphysematous changes and chronic mild dyspnea 3.  Chronic mild orthostatic hypotension 4.  History of Parkinsonian disease 5.  Hip pain probably may suggest degenerative joint disease.  There is also minor tear noted in the ligaments/muscle around the hip joint which may suggest the pain that she's been having chronically.  Recommendation: From cardiac standpoint I agree with holding off on Eliquis until final decision is made regarding aspiration of the hip joint.  Otherwise she can be restarted back on Eliquis, heart rate is well controlled, continue present medical therapy.  She is tolerating atrial fibrillation well. I have reassured her. If BP tolerates, she can be discharged on Dilt 120 mg daily and I will see her in 2 weeks in the office.  Adrian Prows, MD 08/26/2016, 9:22 PM [facility] [place] Pager: [phone] Office: [phone] If no answer Cell [phone]

## 2016-08-26 NOTE — Progress Notes (Signed)
PROGRESS NOTE    [name]  TKZ:[id number] DOB: [birth year] DOA: 08/25/2016 PCP: Colon Branch, MD    Brief Narrative: [name] is a 83 y.o. female with medical history significant of a.fib on Eliquis, Parkinson disease, arthritis recurrent UTI, Presented with left hip and left leg pain since one day admission.   Assessment & Plan:   Active Problems:   Parkinson's disease ([place])   Atrial fibrillation with RVR ([facility])   COPD (chronic obstructive pulmonary disease) ([facility])   Hypokalemia   Fever    Left Hip Pain:  Possibly neuropathy.  DG fluro guided needle aspiration.  Pain control  PT evaluation.    Hypokalemia: replaced.   Hypophosphatemia:  Replace.  Atrial fibrillation: with RVR on admission.  Eliquis was held for possible aspiration.   Fever: cough, ? CAP. CXR Mild atelectasis at the bases with superimposed infection not excluded. Started on rocephin and zithromax. Not requiring oxygen.   COPD: Stable. No wheezing .      DVT prophylaxis: Lovenox.  Code Status: full code) Family Communication: none at bedside Disposition Plan: pending further eval.    Consultants:   Cardiology.   Procedures: none.   Antimicrobials: None.  Subjective: Wants to talk to ganji.   Objective: Vitals:   08/25/16 2356 08/26/16 0445 08/26/16 0900 08/26/16 1257  BP: (!) 140/91 132/84  114/73  Pulse: 86 85  78  Resp: '18 18  18  '$ Temp: 99.5 F (37.5 C) 99.4 F (37.4 C)  97.9 F (36.6 C)  TempSrc: Oral Oral  Oral  SpO2: 94% 96% 93% 95%  Weight:      Height:        Intake/Output Summary (Last 24 hours) at 08/26/16 1715 Last data filed at 08/26/16 0300  Gross per 24 hour  Intake            512.4 ml  Output                0 ml  Net            512.4 ml   Filed Weights   08/25/16 0903  Weight: 70.3 kg (155 lb)    Examination:  General exam: Appears calm and comfortable  Respiratory system: Clear to auscultation. Respiratory effort  normal. Cardiovascular system: S1 & S2 heard, RRR. No JVD, murmurs, rubs, gallops or clicks. No pedal edema. Gastrointestinal system: Abdomen is nondistended, soft and nontender. No organomegaly or masses felt. Normal bowel sounds heard. Central nervous system: Alert and oriented. No focal neurological deficits. Extremities: Symmetric 5 x 5 power. Skin: No rashes, lesions or ulcers     Data Reviewed: I have personally reviewed following labs and imaging studies  CBC:  Recent Labs Lab 08/25/16 0942 08/26/16 0345  WBC 9.7 7.4  NEUTROABS 8.0*  --   HGB 12.4 11.5*  HCT 38.5 34.7*  MCV 92.5 90.8  PLT 176 573   Basic Metabolic Panel:  Recent Labs Lab 08/25/16 0942 08/26/16 0345  NA 140 138  K 3.3* 3.3*  CL 100* 103  CO2 30 27  GLUCOSE 101* 91  BUN 10 9  CREATININE 0.70 0.68  CALCIUM 9.1 8.7*  MG  --  1.8  PHOS  --  2.3*   GFR: Estimated Creatinine Clearance: 54.6 mL/min (by C-G formula based on SCr of 0.68 mg/dL). Liver Function Tests:  Recent Labs Lab 08/26/16 0345  AST 21  ALT 16  ALKPHOS 68  BILITOT 1.4*  PROT 5.8*  ALBUMIN 3.2*  No results for input(s): LIPASE, AMYLASE in the last 168 hours. No results for input(s): AMMONIA in the last 168 hours. Coagulation Profile: No results for input(s): INR, PROTIME in the last 168 hours. Cardiac Enzymes: No results for input(s): CKTOTAL, CKMB, CKMBINDEX, TROPONINI in the last 168 hours. BNP (last 3 results) No results for input(s): PROBNP in the last 8760 hours. HbA1C: No results for input(s): HGBA1C in the last 72 hours. CBG: No results for input(s): GLUCAP in the last 168 hours. Lipid Profile: No results for input(s): CHOL, HDL, LDLCALC, TRIG, CHOLHDL, LDLDIRECT in the last 72 hours. Thyroid Function Tests:  Recent Labs  08/26/16 0345  TSH 2.061   Anemia Panel: No results for input(s): VITAMINB12, FOLATE, FERRITIN, TIBC, IRON, RETICCTPCT in the last 72 hours. Sepsis Labs:  Recent Labs Lab  08/25/16 6948  LATICACIDVEN 1.4    Recent Results (from the past 240 hour(s))  Blood culture (routine x 2)     Status: None (Preliminary result)   Collection Time: 08/25/16  9:30 AM  Result Value Ref Range Status   Specimen Description BLOOD LEFT FOREARM  Final   Special Requests   Final    BOTTLES DRAWN AEROBIC AND ANAEROBIC Blood Culture adequate volume   Culture NO GROWTH 1 DAY  Final   Report Status PENDING  Incomplete  Blood culture (routine x 2)     Status: None (Preliminary result)   Collection Time: 08/25/16  9:42 AM  Result Value Ref Range Status   Specimen Description BLOOD LEFT FOREARM  Final   Special Requests   Final    BOTTLES DRAWN AEROBIC ONLY Blood Culture adequate volume   Culture NO GROWTH 1 DAY  Final   Report Status PENDING  Incomplete  Respiratory Panel by PCR     Status: None   Collection Time: 08/25/16  7:24 PM  Result Value Ref Range Status   Adenovirus NOT DETECTED NOT DETECTED Final   Coronavirus 229E NOT DETECTED NOT DETECTED Final   Coronavirus HKU1 NOT DETECTED NOT DETECTED Final   Coronavirus NL63 NOT DETECTED NOT DETECTED Final   Coronavirus OC43 NOT DETECTED NOT DETECTED Final   Metapneumovirus NOT DETECTED NOT DETECTED Final   Rhinovirus / Enterovirus NOT DETECTED NOT DETECTED Final   Influenza A NOT DETECTED NOT DETECTED Final   Influenza B NOT DETECTED NOT DETECTED Final   Parainfluenza Virus 1 NOT DETECTED NOT DETECTED Final   Parainfluenza Virus 2 NOT DETECTED NOT DETECTED Final   Parainfluenza Virus 3 NOT DETECTED NOT DETECTED Final   Parainfluenza Virus 4 NOT DETECTED NOT DETECTED Final   Respiratory Syncytial Virus NOT DETECTED NOT DETECTED Final   Bordetella pertussis NOT DETECTED NOT DETECTED Final   Chlamydophila pneumoniae NOT DETECTED NOT DETECTED Final   Mycoplasma pneumoniae NOT DETECTED NOT DETECTED Final         Radiology Studies: Dg Chest 2 View  Result Date: 08/26/2016 CLINICAL DATA:  Low grade fever. EXAM: CHEST  2  VIEW COMPARISON:  Yesterday FINDINGS: Chronic cardiomegaly. Stable aortic and hilar contours. Hyperinflation with mild streaky density at the bases, subtly increased based on the lateral view. No edema, effusion, or pneumothorax. IMPRESSION: Mild atelectasis at the bases with superimposed infection not excluded. Borderline increased from yesterday. Cardiomegaly. COPD. Electronically Signed   By: Monte Fantasia M.D.   On: 08/26/2016 08:12   Dg Chest 2 View  Result Date: 08/25/2016 CLINICAL DATA:  Fever.  Abdominal pain. EXAM: CHEST  2 VIEW COMPARISON:  07/23/2016 FINDINGS: The cardiac  silhouette remains mildly enlarged. Aortic atherosclerosis is noted. There is mild chronic interstitial coarsening. The lungs are hyperinflated on the lateral radiograph, although the patient has taken a shallower inspiration on the frontal projection. There is focal mild opacity in the lateral right lung base on the frontal projection, and there is mild curvilinear opacity in the left lung base. No sizable pleural effusion or pneumothorax is identified. No acute osseous abnormality is seen. IMPRESSION: Mild bibasilar opacities, likely atelectasis though early infection is not excluded. Electronically Signed   By: Logan Bores M.D.   On: 08/25/2016 16:58   Mr Hip Left W Wo Contrast  Result Date: 08/25/2016 CLINICAL DATA:  Left leg pain, unable to walk EXAM: MRI OF THE LEFT HIP WITHOUT AND WITH CONTRAST TECHNIQUE: Multiplanar, multisequence MR imaging was performed both before and after administration of intravenous contrast. CONTRAST:  35m MULTIHANCE GADOBENATE DIMEGLUMINE 529 MG/ML IV SOLN COMPARISON:  None. FINDINGS: Bones: No hip fracture, dislocation or avascular necrosis. No periosteal reaction or bone destruction. No aggressive osseous lesion. Normal sacrum and sacroiliac joints. No SI joint widening or erosive changes. Degenerative disc disease with disc height loss at L3-4, L4-5 and L5-S1. Articular cartilage and labrum  Articular cartilage: High-grade partial-thickness cartilage loss of the left femoral head and acetabulum. Partial-thickness cartilage loss of the right femoral head and acetabulum. Bilateral inferior femoral head marginal osteophytes. Labrum: Grossly intact, but evaluation is limited by lack of intraarticular fluid. Joint or bursal effusion Joint effusion: Small left hip joint effusion. No right hip joint effusion. No SI joint effusion. Bursae:  No bursa formation. Muscles and tendons Flexors: Normal. Extensors: Normal. Abductors: Normal. Adductors: Normal. Gluteals: Normal. Hamstrings: Mild tendinosis of the left hamstring origin. Small partial tear of the right hamstring origin. Other findings Miscellaneous: No pelvic free fluid. No fluid collection or hematoma. No inguinal lymphadenopathy. No inguinal hernia. Incidental note made of a left pelvic kidney. IMPRESSION: 1. No hip fracture, dislocation or avascular necrosis. 2. Moderate osteoarthritis of bilateral hips, left greater than right. Small left hip joint effusion. 3. Mild tendinosis of the left hamstring origin. 4. Small partial tear of the right hamstring origin. 5. Degenerative disc disease with disc height loss at L3-4, L4-5 and L5-S1. Electronically Signed   By: HKathreen Devoid  On: 08/25/2016 15:13   Dg Hip Unilat W Or Wo Pelvis 2-3 Views Left  Result Date: 08/25/2016 CLINICAL DATA:  Chronic left hip pain EXAM: DG HIP (WITH OR WITHOUT PELVIS) 2-3V LEFT COMPARISON:  None. FINDINGS: Moderate osteoarthritic changes in the hips bilaterally with joint space narrowing and spurring. SI joints are symmetric and unremarkable. No acute bony abnormality. Specifically, no fracture, subluxation, or dislocation. Soft tissues are intact. IMPRESSION: Moderate symmetric degenerative changes in the hips bilaterally. No acute findings. Electronically Signed   By: KRolm BaptiseM.D.   On: 08/25/2016 09:56        Scheduled Meds: . diltiazem  30 mg Oral Q6H  .  DULoxetine  30 mg Oral Daily  . feeding supplement (ENSURE ENLIVE)  237 mL Oral BID BM  . fludrocortisone  0.1 mg Oral Daily  . guaiFENesin  600 mg Oral BID  . mometasone-formoterol  2 puff Inhalation BID  . potassium chloride  40 mEq Oral BID  . senna  1 tablet Oral BID   Continuous Infusions: . azithromycin Stopped (08/26/16 1441)  . cefTRIAXone (ROCEPHIN)  IV Stopped (08/26/16 1230)     LOS: 0 days    Time spent: 30 minutes.  Hosie Poisson, MD [facility] Pager [phone]  If 7PM-7AM, please contact night-coverage [email or web address] Password [email or web address] 08/26/2016, 5:15 PM

## 2016-08-26 NOTE — Progress Notes (Signed)
ANTICOAGULATION CONSULT NOTE - Initial Consult  Pharmacy Consult for Heparin Indication: atrial fibrillation  Allergies  Allergen Reactions  . Adhesive [Tape] Other (See Comments)    Caused blisters  . Amoxicillin Other (See Comments)    Has patient had a PCN reaction causing immediate rash, facial/tongue/throat swelling, SOB or lightheadedness with hypotension: Yes Has patient had a PCN reaction causing severe rash involving mucus membranes or skin necrosis: No Has patient had a PCN reaction that required hospitalization No Has patient had a PCN reaction occurring within the last 10 years: Yes If all of the above answers are "NO", then may proceed with Cephalosporin use.   Caused thrush  . Ciprofloxacin Other (See Comments)    Caused thrush  . Ultram [Tramadol] Other (See Comments)    hypotension    Patient Measurements: Height: '5\' 10"'$  (177.8 cm) Weight: 155 lb (70.3 kg) IBW/kg (Calculated) : 68.5 Heparin Dosing Weight: 70.3 kg  Vital Signs: Temp: 99.4 F (37.4 C) (05/08 0445) Temp Source: Oral (05/08 0445) BP: 132/84 (05/08 0445) Pulse Rate: 85 (05/08 0445)  Labs:  Recent Labs  08/25/16 0942 08/25/16 1924 08/26/16 0345  HGB 12.4  --  11.5*  HCT 38.5  --  34.7*  PLT 176  --  152  APTT  --  37* 62*  HEPARINUNFRC  --  1.26*  --   CREATININE 0.70  --   --     Estimated Creatinine Clearance: 54.6 mL/min (by C-G formula based on SCr of 0.7 mg/dL).   Medical History: Past Medical History:  Diagnosis Date  . Anxiety   . Arthritis   . Atrial fibrillation ([place])   . Cardiac arrhythmia due to congenital heart disease   . Chronic constipation   . COPD (chronic obstructive pulmonary disease) ([place])   . Depression   . GERD (gastroesophageal reflux disease)   . Hemorrhoid   . Hyperlipidemia   . Idiopathic hypotension   . Lightheadedness 11/2015  . Mycotic toenails 10/27/2012  . Parkinson disease ([place])   . Recurrent UTI   . Tubulovillous adenoma of colon 02/1992   . Varicose veins     Medications:  Prescriptions Prior to Admission  Medication Sig Dispense Refill Last Dose  . apixaban (ELIQUIS) 5 MG TABS tablet Take 5 mg by mouth 2 (two) times daily.   08/24/2016 at 1900  . Ascorbic Acid (VITAMIN C) 1000 MG tablet Take 1,000 mg by mouth daily.   08/24/2016 at Unknown time  . azelastine (ASTELIN) 0.1 % nasal spray Place 2 sprays into both nostrils at bedtime as needed for rhinitis. Use in each nostril as directed 30 mL 3 Past Month at Unknown time  . cholecalciferol (VITAMIN D) 1000 units tablet Take 1,000 Units by mouth daily.   08/24/2016 at Unknown time  . DULoxetine (CYMBALTA) 30 MG capsule Take 1 capsule (30 mg total) by mouth daily. 30 capsule 3 Past Week at Unknown time  . fludrocortisone (FLORINEF) 0.1 MG tablet Take 0.1 mg by mouth daily.   08/24/2016 at Unknown time  . polyethylene glycol powder (GLYCOLAX/MIRALAX) powder Take 17 g by mouth 2 (two) times daily. (Patient taking differently: Take 17 g by mouth daily. ) 255 g 0 08/24/2016 at Unknown time  . Propylene Glycol (SYSTANE BALANCE OP) Apply 2 drops to eye 3 (three) times daily as needed.   Past Week at Unknown time  . SYMBICORT 160-4.5 MCG/ACT inhaler INHALE 2 PUFFS BY MOUTH INTO THE LUNGS TWICE DAILY. 3 Inhaler 3 Past Week at  Unknown time  . albuterol (VENTOLIN HFA) 108 (90 BASE) MCG/ACT inhaler Inhale 2 puffs into the lungs 2 (two) times daily as needed for wheezing or shortness of breath. 18 g 4 rescue  . diltiazem (CARDIZEM) 30 MG tablet Take 1 tablet (30 mg total) by mouth 4 (four) times daily. Take at onset on atrial fibrillation 30 tablet 0 rescue  . fluticasone (FLONASE) 50 MCG/ACT nasal spray Place 2 sprays into both nostrils daily. (Patient not taking: Reported on 08/25/2016) 16 g 1 Not Taking at Unknown time  . meclizine (ANTIVERT) 25 MG tablet Take 1 or 2 po Q 6hrs for dizziness (Patient not taking: Reported on 08/25/2016) 40 tablet 0 Not Taking at Unknown time   Scheduled:  Infusions:    Assessment: 83yo female with history of Afib on apixaban PTA. Last dose was taken 5/6 at 1800. Pharmacy is consulted to dose heparin for atrial fibrillation while awaiting procedure. No bleeding or infusion related issues reported per RN.  aPTT subtherapeutic: 62 seconds  Goal of Therapy:  Heparin level 0.3-0.7 units/ml aPTT 66-102 seconds Monitor platelets by anticoagulation protocol: Yes   Plan:  Increase heparin gtt to 1150 units/hr Check aPTT/anti-Xa level in 8 hours and daily while on heparin Continue to monitor H&H and platelets   Georga Bora, PharmD Clinical Pharmacist 08/26/2016 5:33 AM

## 2016-08-26 NOTE — Care Management Obs Status (Signed)
[place] NOTIFICATION   Patient Details  Name: [name] MRN: [id number] Date of Birth: [birth year]   Medicare Observation Status Notification Given:  Yes    Carles Collet, RN 08/26/2016, 2:23 PM

## 2016-08-26 NOTE — Progress Notes (Signed)
[facility] for Heparin Indication: atrial fibrillation  Allergies  Allergen Reactions  . Adhesive [Tape] Other (See Comments)    Caused blisters  . Amoxicillin Other (See Comments)    Has patient had a PCN reaction causing immediate rash, facial/tongue/throat swelling, SOB or lightheadedness with hypotension: Yes Has patient had a PCN reaction causing severe rash involving mucus membranes or skin necrosis: No Has patient had a PCN reaction that required hospitalization No Has patient had a PCN reaction occurring within the last 10 years: Yes If all of the above answers are "NO", then may proceed with Cephalosporin use.   Caused thrush  . Ciprofloxacin Other (See Comments)    Caused thrush  . Ultram [Tramadol] Other (See Comments)    hypotension    Patient Measurements: Height: '5\' 10"'$  (177.8 cm) Weight: 155 lb (70.3 kg) IBW/kg (Calculated) : 68.5 Heparin Dosing Weight: 70.3 kg  Vital Signs: Temp: 97.9 F (36.6 C) (05/08 1257) Temp Source: Oral (05/08 1257) BP: 114/73 (05/08 1257) Pulse Rate: 78 (05/08 1257)  Labs:  Recent Labs  08/25/16 0942  08/25/16 1924 08/26/16 0345 08/26/16 1336 08/26/16 2054  HGB 12.4  --   --  11.5*  --   --   HCT 38.5  --   --  34.7*  --   --   PLT 176  --   --  152  --   --   APTT  --   < > 37* 62* 72* 39*  HEPARINUNFRC  --   --  1.26*  --  0.93*  --   CREATININE 0.70  --   --  0.68  --   --   < > = values in this interval not displayed.  Estimated Creatinine Clearance: 54.6 mL/min (by C-G formula based on SCr of 0.68 mg/dL).    Assessment: 83yo female with history of Afib on apixaban PTA. Last dose was taken 5/6 at 1800. Pharmacy is consulted to dose heparin for atrial fibrillation while awaiting procedure.  Baseline heparin level elevated as expected, aPTT normal.  Heparin started at 1150 units/hr- first heparin level on infusion elevated (due to effects of Eliquis) at 0.92 units/mL, aPTT in  range at 72 seconds.  PTT this evening is back at baseline.  Upon investigation, heparin was turned off this afternoon.  Spoke to Dr. Einar Gip, he wants heparin to continue until final decision is made on whether she will have hip aspiration.  He says she needs either Eliquis or heparin.  Goal of Therapy:  Heparin level 0.3-0.7 units/ml aPTT 66-102 seconds Monitor platelets by anticoagulation protocol: Yes   Plan:  Resume heparin gtt at 1150 units/hr Confirmatory aPTT at 0600 tomorrow. Daily heparin level and aPTT until correlating Daily CBC  Uvaldo Rising, BCPS  Clinical Pharmacist Pager [phone]  08/26/2016 10:02 PM

## 2016-08-26 NOTE — Progress Notes (Signed)
[place] [facility] for Heparin Indication: atrial fibrillation  Allergies  Allergen Reactions  . Adhesive [Tape] Other (See Comments)    Caused blisters  . Amoxicillin Other (See Comments)    Has patient had a PCN reaction causing immediate rash, facial/tongue/throat swelling, SOB or lightheadedness with hypotension: Yes Has patient had a PCN reaction causing severe rash involving mucus membranes or skin necrosis: No Has patient had a PCN reaction that required hospitalization No Has patient had a PCN reaction occurring within the last 10 years: Yes If all of the above answers are "NO", then may proceed with Cephalosporin use.   Caused thrush  . Ciprofloxacin Other (See Comments)    Caused thrush  . Ultram [Tramadol] Other (See Comments)    hypotension    Patient Measurements: Height: '5\' 10"'$  (177.8 cm) Weight: 155 lb (70.3 kg) IBW/kg (Calculated) : 68.5 Heparin Dosing Weight: 70.3 kg  Vital Signs: Temp: 97.9 F (36.6 C) (05/08 1257) Temp Source: Oral (05/08 1257) BP: 114/73 (05/08 1257) Pulse Rate: 78 (05/08 1257)  Labs:  Recent Labs  08/25/16 0942 08/25/16 1924 08/26/16 0345 08/26/16 1336  HGB 12.4  --  11.5*  --   HCT 38.5  --  34.7*  --   PLT 176  --  152  --   APTT  --  37* 62* 72*  HEPARINUNFRC  --  1.26*  --  0.93*  CREATININE 0.70  --  0.68  --     Estimated Creatinine Clearance: 54.6 mL/min (by C-G formula based on SCr of 0.68 mg/dL).    Assessment: 83yo female with history of Afib on apixaban PTA. Last dose was taken 5/6 at 1800. Pharmacy is consulted to dose heparin for atrial fibrillation while awaiting procedure.  Baseline heparin level elevated as expected, aPTT normal.  Heparin started at 1150 units/hr- first heparin level on infusion elevated (due to effects of Eliquis) at 0.92 units/mL, aPTT in range at 72 seconds.  Hgb 11.5, plts 152- no bleeding noted.  Goal of Therapy:  Heparin level 0.3-0.7  units/ml aPTT 66-102 seconds Monitor platelets by anticoagulation protocol: Yes   Plan:  Continue heparin gtt at 1150 units/hr Confirmatory aPTT at 2100 tonight  Daily heparin level and aPTT until correlating Daily CBC  Anik Wesch D. Lurine Imel, PharmD, BCPS Clinical Pharmacist Pager: [phone] 08/26/2016 2:33 PM

## 2016-08-27 ENCOUNTER — Inpatient Hospital Stay (HOSPITAL_COMMUNITY): Payer: Medicare Other

## 2016-08-27 ENCOUNTER — Telehealth: Payer: Self-pay | Admitting: Internal Medicine

## 2016-08-27 LAB — CBC
HCT: 34.9 % — ABNORMAL LOW (ref 36.0–46.0)
HEMOGLOBIN: 11.3 g/dL — AB (ref 12.0–15.0)
MCH: 29.4 pg (ref 26.0–34.0)
MCHC: 32.4 g/dL (ref 30.0–36.0)
MCV: 90.9 fL (ref 78.0–100.0)
Platelets: 142 10*3/uL — ABNORMAL LOW (ref 150–400)
RBC: 3.84 MIL/uL — AB (ref 3.87–5.11)
RDW: 15.5 % (ref 11.5–15.5)
WBC: 7.2 10*3/uL (ref 4.0–10.5)

## 2016-08-27 LAB — APTT: aPTT: 94 seconds — ABNORMAL HIGH (ref 24–36)

## 2016-08-27 LAB — HEPARIN LEVEL (UNFRACTIONATED): Heparin Unfractionated: 0.75 IU/mL — ABNORMAL HIGH (ref 0.30–0.70)

## 2016-08-27 MED ORDER — APIXABAN 5 MG PO TABS
5.0000 mg | ORAL_TABLET | Freq: Two times a day (BID) | ORAL | Status: DC
  Administered 2016-08-27 – 2016-08-29 (×4): 5 mg via ORAL
  Filled 2016-08-27 (×5): qty 1

## 2016-08-27 MED ORDER — IOPAMIDOL (ISOVUE-M 200) INJECTION 41%
INTRAMUSCULAR | Status: AC
  Administered 2016-08-27: 2 mL via INTRA_ARTICULAR
  Filled 2016-08-27: qty 10

## 2016-08-27 MED ORDER — LIDOCAINE HCL (PF) 1 % IJ SOLN
5.0000 mL | Freq: Once | INTRAMUSCULAR | Status: AC
  Administered 2016-08-27: 5 mL via INTRADERMAL

## 2016-08-27 MED ORDER — SODIUM CHLORIDE 0.9 % IJ SOLN
INTRAMUSCULAR | Status: AC
  Filled 2016-08-27: qty 20

## 2016-08-27 MED ORDER — LIDOCAINE HCL 1 % IJ SOLN
INTRAMUSCULAR | Status: AC
  Filled 2016-08-27: qty 10

## 2016-08-27 MED ORDER — IOPAMIDOL (ISOVUE-M 200) INJECTION 41%
10.0000 mL | Freq: Once | INTRAMUSCULAR | Status: AC
  Administered 2016-08-27: 2 mL via INTRA_ARTICULAR

## 2016-08-27 NOTE — Evaluation (Signed)
Physical Therapy Evaluation Patient Details Name: [name] MRN: [id number] DOB: 1927-06-19 Today's Date: 08/27/2016   History of Present Illness  83 y.o. female with medical history significant of a.fib on Eliquis, Parkinson disease, arthritis recurrent UTI, Presented with left hip and left leg pain since one day admission  Clinical Impression  Orders received for PT evaluation. Patient demonstrates deficits in functional mobility as indicated below. Will benefit from continued skilled PT to address deficits and maximize function. Will see as indicated and progress as tolerated.  At this time, feel patient would benefit from ST SNF to maximize recovery of function and acquire strategies to ensure safety with mobility. Patient resides alone and has no available assist. Patient is high fall risk given LLE pain, PD, and now with onset of deconditioning. Patient is motivated to return to PLOF.    Follow Up Recommendations SNF;Supervision/Assistance - 24 hour    Equipment Recommendations  None recommended by PT    Recommendations for Other Services       Precautions / Restrictions Precautions Precautions: Fall Restrictions Weight Bearing Restrictions: No      Mobility  Bed Mobility               General bed mobility comments: received in chair  Transfers Overall transfer level: Needs assistance Equipment used: Straight cane Transfers: Sit to/from Stand Sit to Stand: Min assist         General transfer comment: Min assist to power up to standing and for stability, patient with flexed posture and noted increased sway with retropulsion requiring posterior support initially  Ambulation/Gait Ambulation/Gait assistance: Min assist Ambulation Distance (Feet): 70 Feet Assistive device: Straight cane Gait Pattern/deviations: Decreased stride length;Festinating;Antalgic;Narrow base of support;Trunk flexed Gait velocity: decreased Gait velocity interpretation: <1.8  ft/sec, indicative of risk for recurrent falls General Gait Details: patient with noted instability, multiple LOB requring min assist to correct. Limited function with gait and noted pain in LLE during ambulation  Stairs            Wheelchair Mobility    Modified Rankin (Stroke Patients Only)       Balance Overall balance assessment: Needs assistance   Sitting balance-Leahy Scale: Good       Standing balance-Leahy Scale: Poor Standing balance comment: reliance on UE support                             Pertinent Vitals/Pain      Home Living Family/patient expects to be discharged to:: Private residence Living Arrangements: Alone Available Help at Discharge: Neighbor Type of Home: House Home Access: Stairs to enter Entrance Stairs-Rails: Left Entrance Stairs-Number of Steps: 2 Home Layout: Two level Home Equipment: Walker - 2 wheels;Walker - 4 wheels;Cane - single point;Bedside commode;Shower seat      Prior Function Level of Independence: Independent               Hand Dominance   Dominant Hand: Right    Extremity/Trunk Assessment   Upper Extremity Assessment Upper Extremity Assessment: Generalized weakness    Lower Extremity Assessment Lower Extremity Assessment: Generalized weakness;LLE deficits/detail LLE: Unable to fully assess due to pain LLE Coordination: decreased fine motor;decreased gross motor       Communication   Communication: No difficulties  Cognition Arousal/Alertness: Awake/alert Behavior During Therapy: WFL for tasks assessed/performed Overall Cognitive Status: History of cognitive impairments - at baseline  General Comments: patient with declining memory function, and difficulty with word finding at times      General Comments      Exercises     Assessment/Plan    PT Assessment Patient needs continued PT services  PT Problem List Decreased  strength;Decreased activity tolerance;Decreased balance;Decreased mobility;Decreased coordination;Decreased cognition;Pain       PT Treatment Interventions DME instruction;Gait training;Stair training;Functional mobility training;Therapeutic activities;Therapeutic exercise;Balance training;Neuromuscular re-education;Patient/family education    PT Goals (Current goals can be found in the Care Plan section)  Acute Rehab PT Goals Patient Stated Goal: to get back to being independent PT Goal Formulation: With patient Time For Goal Achievement: 09/10/16 Potential to Achieve Goals: Good    Frequency Min 3X/week   Barriers to discharge        Co-evaluation               AM-PAC PT "6 Clicks" Daily Activity  Outcome Measure Difficulty turning over in bed (including adjusting bedclothes, sheets and blankets)?: Total Difficulty moving from lying on back to sitting on the side of the bed? : Total Difficulty sitting down on and standing up from a chair with arms (e.g., wheelchair, bedside commode, etc,.)?: Total Help needed moving to and from a bed to chair (including a wheelchair)?: A Little Help needed walking in hospital room?: A Little Help needed climbing 3-5 steps with a railing? : A Lot 6 Click Score: 11    End of Session Equipment Utilized During Treatment: Gait belt Activity Tolerance: Patient limited by fatigue;Patient limited by pain Patient left: in chair;with call bell/phone within reach Nurse Communication: Mobility status PT Visit Diagnosis: Unsteadiness on feet (R26.81);Pain Pain - Right/Left: Left Pain - part of body: Hip;Leg    Time: 1140-1158 PT Time Calculation (min) (ACUTE ONLY): 18 min   Charges:   PT Evaluation $PT Eval Moderate Complexity: 1 Procedure     PT G Codes:        Alben Deeds, PT DPT  [phone]   Duncan Dull 08/27/2016, 12:35 PM

## 2016-08-27 NOTE — Progress Notes (Signed)
ANTICOAGULATION CONSULT NOTE - Follow Up Consult  Pharmacy Consult for Heparin (apixaban on hold) Indication: atrial fibrillation  Patient Measurements: Height: '5\' 10"'$  (177.8 cm) Weight: 155 lb (70.3 kg) IBW/kg (Calculated) : 68.5  Vital Signs: Temp: 98.2 F (36.8 C) (05/09 0405) Temp Source: Oral (05/09 0405) BP: 148/87 (05/09 0405) Pulse Rate: 81 (05/09 0405)  Labs:  Recent Labs  08/25/16 0942  08/25/16 1924 08/26/16 0345 08/26/16 1336 08/26/16 2054 08/27/16 0522  HGB 12.4  --   --  11.5*  --   --  11.3*  HCT 38.5  --   --  34.7*  --   --  34.9*  PLT 176  --   --  152  --   --  142*  APTT  --   < > 37* 62* 72* 39* 94*  HEPARINUNFRC  --   --  1.26*  --  0.93*  --  0.75*  CREATININE 0.70  --   --  0.68  --   --   --   < > = values in this interval not displayed.  Estimated Creatinine Clearance: 54.6 mL/min (by C-G formula based on SCr of 0.68 mg/dL).   Assessment: Heparin while apixaban on hold in anticipation of procedures, aPTT is therapeutic at 94 after re-start, using aPTT to dose for now given apixaban influence on anti-Xa levels.   Goal of Therapy:  Heparin level 0.3-0.7 units/ml aPTT 66-102 seconds Monitor platelets by anticoagulation protocol: Yes   Plan:  -Cont heparin 1150 units/hr -1200 aPTT  Narda Bonds 08/27/2016,6:04 AM

## 2016-08-27 NOTE — Consult Note (Signed)
                                                                                                                                                                                                                    [facility] CM Primary Care Navigator  08/27/2016  [name] 1927-08-19 [id number]   Went to see patientat the bedside to identify possible discharge needs.  Patient reports having extreme pain to left lower extremity with difficulty walking that had led to this admission. Patient endorses Dr. Ronette Deter [facility] as the primary care provider.   Patient shared using [facility] to obtain medications without difficulty.   Patient manages her own medications at home using "pill box" system weekly.  Patient states that she usually drives to her doctors'appointments prior to admission. She has 2 daughters but are in [place] and [place]. Patient voiced concern about transportation at this time. [facility] list of transportation resources provided for her to use for transportation needs.  Patient reports that she lives alone, independent with her care at home and no available help to assist her.       [facility] list of personal care services was provided as aresource when she needs it, with the understanding that she will pay out of the pocket for it.Patient wasthankful for the resource lists provided.    Anticipated discharge plan is skilled nursing facility (SNF) per PT recommendation to maximize recovery of function and acquire strategies to ensure safety with mobility before returning back home.  Patient expressedunderstanding to call primary care provider's officewhen she returns back home,for a post discharge  follow-up appointment within a week or sooner if needed.Patient letter (with PCP's contact number) was provided as her reminder.  Explained to patient about [facility] CM services available for healthmanagement and  she reports being able to manage health issues at home (COPD) with no problem so far.   [facility] care management contact information provided for future needs that may arise.  For questions, please contact:  Dannielle Huh, BSN, RN- [facility] Primary Care Navigator  Telephone: [phone] [place]

## 2016-08-27 NOTE — Progress Notes (Addendum)
Initial Nutrition Assessment  DOCUMENTATION CODES:   Not applicable  INTERVENTION:    Continue Ensure Enlive po BID, each supplement provides 350 kcal and 20 grams of protein  NUTRITION DIAGNOSIS:   Increased nutrient needs related to chronic illness as evidenced by estimated needs  GOAL:   Patient will meet greater than or equal to 90% of their needs  MONITOR:   PO intake, Supplement acceptance, Labs, Weight trends, I & O's  REASON FOR ASSESSMENT:   Malnutrition Screening Tool  ASSESSMENT:   83 y.o. Female with medical history significant of a.fib on Eliquis, Parkinson disease, COPD, arthritis and recurrent UTI; presented with L hip and L leg pain.   Pt currently in [facility]. Per Malnutrition Screening Tool Report, pt with recent unintentional weight loss (14-23 lbs).  Time frame known. PO intake good at 75% per flowsheet records; receiving Ensure Enlive BID. Medications reviewed and include ABX and Miralax. Labs reviewed.  Phos 2.3 (L).  Potassium 3.3 (L).  Unable to complete Nutrition-Focused physical exam at this time.   Diet Order:  Diet Heart Room service appropriate? Yes; Fluid consistency: Thin  Skin:  Reviewed, no issues  Last BM:  5/8  Height:   Ht Readings from Last 1 Encounters:  08/25/16 '5\' 10"'$  (1.778 m)    Weight:   Wt Readings from Last 1 Encounters:  08/25/16 155 lb (70.3 kg)    Ideal Body Weight:  68.1 kg  BMI:  Body mass index is 22.24 kg/m.  Estimated Nutritional Needs:   Kcal:  1500-1700  Protein:  70-80 gm  Fluid:  1.5-1.7 L  EDUCATION NEEDS:   No education needs identified at this time  Arthur Holms, RD, LDN Pager #: [phone] After-Hours Pager #: [phone]

## 2016-08-27 NOTE — Telephone Encounter (Signed)
Spoke with the patient's daughter, she is mostly concerned about the patient's disposition, prior to the admission she lives by herself and she does not feel Lashya is ready to go back to her home alone. She already talk with the nurse taking care of the patient and was told they are planning a PT consult and the case manager/social worker will look into resources. She knows to call me back if needed

## 2016-08-27 NOTE — Telephone Encounter (Signed)
Pt's daughter Bethann [facility] [phone], called in with a few concerns about pt. She says that pt is currently in the hospital [facility]) . She would like to discuss with provider her care plan, meaning she feels that pt may need PT she would like to be advised on what she should do or what she could do to assist pt being so far away.   Please call back to address concerns she's nervous and not sure of the direction to take for the better of her mom.

## 2016-08-27 NOTE — Progress Notes (Signed)
PROGRESS NOTE    [name]  ZOX:[id number] DOB: 1927-04-24 DOA: 08/25/2016 PCP: Colon Branch, MD    Brief Narrative: [name] is a 83 y.o. female with medical history significant of a.fib on Eliquis, Parkinson disease, arthritis recurrent UTI, Presented with left hip and left leg pain since one day admission.   Assessment & Plan:   Active Problems:   Parkinson's disease ([place])   Atrial fibrillation with RVR (HCC)   COPD (chronic obstructive pulmonary disease) (HCC)   Hypokalemia   Fever    Left Hip Pain:  Possibly neuropathy, ? Chronic. DG fluro guided needle aspiration of the hip joint, will be done this afternoon , after which eliquis can be restarted.  Pain control  PT evaluation recommended SNF placement.  Social worker consulted .    Hypokalemia: replaced.   Hypophosphatemia:  Replace.  Atrial fibrillation: with RVR on admission.  Eliquis was held for possible aspiration.  Currently rate controlled and on diltiazem.   Fever: cough, ? CAP. CXR Mild atelectasis at the bases with superimposed infection not excluded. Started on rocephin and zithromax. Not requiring oxygen.  Cough is better, would continue with antibiotics and get repeat CXR in 48 hours .   COPD: Stable. No wheezing .   Mild normocytic anemia. Stable hemoglobin at 11.      DVT prophylaxis: eliquis Code Status: full code) Family Communication: none at bedside Disposition Plan: pending further eval.    Consultants:   Cardiology.   Procedures: none.   Antimicrobials: None.  Subjective: Reports feeling better than yesterday,but still weak.   Objective: Vitals:   08/26/16 2207 08/27/16 0048 08/27/16 0405 08/27/16 1203  BP: 129/90 (!) 146/100 (!) 148/87 106/68  Pulse: 81  81   Resp: 18  17   Temp: 97.7 F (36.5 C)  98.2 F (36.8 C)   TempSrc: Oral  Oral   SpO2: 94%  94%   Weight:      Height:        Intake/Output Summary (Last 24 hours) at 08/27/16  1307 Last data filed at 08/27/16 0602  Gross per 24 hour  Intake           746.25 ml  Output              551 ml  Net           195.25 ml   Filed Weights   08/25/16 0903  Weight: 70.3 kg (155 lb)    Examination:  General exam: Appears calm and comfortable  Respiratory system: Clear to auscultation. Respiratory effort normal. Cardiovascular system: S1 & S2 heard, RRR. No JVD, murmurs, rubs, gallops or clicks. No pedal edema. Gastrointestinal system: Abdomen is nondistended, soft and nontender. No organomegaly or masses felt. Normal bowel sounds heard. Central nervous system: Alert and oriented. No focal neurological deficits. Extremities: no pedal edema.  Skin: No rashes, lesions or ulcers     Data Reviewed: I have personally reviewed following labs and imaging studies  CBC:  Recent Labs Lab 08/25/16 0942 08/26/16 0345 08/27/16 0522  WBC 9.7 7.4 7.2  NEUTROABS 8.0*  --   --   HGB 12.4 11.5* 11.3*  HCT 38.5 34.7* 34.9*  MCV 92.5 90.8 90.9  PLT 176 152 811*   Basic Metabolic Panel:  Recent Labs Lab 08/25/16 0942 08/26/16 0345  NA 140 138  K 3.3* 3.3*  CL 100* 103  CO2 30 27  GLUCOSE 101* 91  BUN 10 9  CREATININE 0.70  0.68  CALCIUM 9.1 8.7*  MG  --  1.8  PHOS  --  2.3*   GFR: Estimated Creatinine Clearance: 54.6 mL/min (by C-G formula based on SCr of 0.68 mg/dL). Liver Function Tests:  Recent Labs Lab 08/26/16 0345  AST 21  ALT 16  ALKPHOS 68  BILITOT 1.4*  PROT 5.8*  ALBUMIN 3.2*   No results for input(s): LIPASE, AMYLASE in the last 168 hours. No results for input(s): AMMONIA in the last 168 hours. Coagulation Profile: No results for input(s): INR, PROTIME in the last 168 hours. Cardiac Enzymes: No results for input(s): CKTOTAL, CKMB, CKMBINDEX, TROPONINI in the last 168 hours. BNP (last 3 results) No results for input(s): PROBNP in the last 8760 hours. HbA1C: No results for input(s): HGBA1C in the last 72 hours. CBG: No results for  input(s): GLUCAP in the last 168 hours. Lipid Profile: No results for input(s): CHOL, HDL, LDLCALC, TRIG, CHOLHDL, LDLDIRECT in the last 72 hours. Thyroid Function Tests:  Recent Labs  08/26/16 0345  TSH 2.061   Anemia Panel: No results for input(s): VITAMINB12, FOLATE, FERRITIN, TIBC, IRON, RETICCTPCT in the last 72 hours. Sepsis Labs:  Recent Labs Lab 08/25/16 5462  LATICACIDVEN 1.4    Recent Results (from the past 240 hour(s))  Blood culture (routine x 2)     Status: None (Preliminary result)   Collection Time: 08/25/16  9:30 AM  Result Value Ref Range Status   Specimen Description BLOOD LEFT FOREARM  Final   Special Requests   Final    BOTTLES DRAWN AEROBIC AND ANAEROBIC Blood Culture adequate volume   Culture NO GROWTH 2 DAYS  Final   Report Status PENDING  Incomplete  Blood culture (routine x 2)     Status: None (Preliminary result)   Collection Time: 08/25/16  9:42 AM  Result Value Ref Range Status   Specimen Description BLOOD LEFT FOREARM  Final   Special Requests   Final    BOTTLES DRAWN AEROBIC ONLY Blood Culture adequate volume   Culture NO GROWTH 2 DAYS  Final   Report Status PENDING  Incomplete  Respiratory Panel by PCR     Status: None   Collection Time: 08/25/16  7:24 PM  Result Value Ref Range Status   Adenovirus NOT DETECTED NOT DETECTED Final   Coronavirus 229E NOT DETECTED NOT DETECTED Final   Coronavirus HKU1 NOT DETECTED NOT DETECTED Final   Coronavirus NL63 NOT DETECTED NOT DETECTED Final   Coronavirus OC43 NOT DETECTED NOT DETECTED Final   Metapneumovirus NOT DETECTED NOT DETECTED Final   Rhinovirus / Enterovirus NOT DETECTED NOT DETECTED Final   Influenza A NOT DETECTED NOT DETECTED Final   Influenza B NOT DETECTED NOT DETECTED Final   Parainfluenza Virus 1 NOT DETECTED NOT DETECTED Final   Parainfluenza Virus 2 NOT DETECTED NOT DETECTED Final   Parainfluenza Virus 3 NOT DETECTED NOT DETECTED Final   Parainfluenza Virus 4 NOT DETECTED NOT  DETECTED Final   Respiratory Syncytial Virus NOT DETECTED NOT DETECTED Final   Bordetella pertussis NOT DETECTED NOT DETECTED Final   Chlamydophila pneumoniae NOT DETECTED NOT DETECTED Final   Mycoplasma pneumoniae NOT DETECTED NOT DETECTED Final         Radiology Studies: Dg Chest 2 View  Result Date: 08/26/2016 CLINICAL DATA:  Low grade fever. EXAM: CHEST  2 VIEW COMPARISON:  Yesterday FINDINGS: Chronic cardiomegaly. Stable aortic and hilar contours. Hyperinflation with mild streaky density at the bases, subtly increased based on the lateral view. No  edema, effusion, or pneumothorax. IMPRESSION: Mild atelectasis at the bases with superimposed infection not excluded. Borderline increased from yesterday. Cardiomegaly. COPD. Electronically Signed   By: Monte Fantasia M.D.   On: 08/26/2016 08:12   Dg Chest 2 View  Result Date: 08/25/2016 CLINICAL DATA:  Fever.  Abdominal pain. EXAM: CHEST  2 VIEW COMPARISON:  07/23/2016 FINDINGS: The cardiac silhouette remains mildly enlarged. Aortic atherosclerosis is noted. There is mild chronic interstitial coarsening. The lungs are hyperinflated on the lateral radiograph, although the patient has taken a shallower inspiration on the frontal projection. There is focal mild opacity in the lateral right lung base on the frontal projection, and there is mild curvilinear opacity in the left lung base. No sizable pleural effusion or pneumothorax is identified. No acute osseous abnormality is seen. IMPRESSION: Mild bibasilar opacities, likely atelectasis though early infection is not excluded. Electronically Signed   By: Logan Bores M.D.   On: 08/25/2016 16:58   Mr Hip Left W Wo Contrast  Result Date: 08/25/2016 CLINICAL DATA:  Left leg pain, unable to walk EXAM: MRI OF THE LEFT HIP WITHOUT AND WITH CONTRAST TECHNIQUE: Multiplanar, multisequence MR imaging was performed both before and after administration of intravenous contrast. CONTRAST:  57m MULTIHANCE  GADOBENATE DIMEGLUMINE 529 MG/ML IV SOLN COMPARISON:  None. FINDINGS: Bones: No hip fracture, dislocation or avascular necrosis. No periosteal reaction or bone destruction. No aggressive osseous lesion. Normal sacrum and sacroiliac joints. No SI joint widening or erosive changes. Degenerative disc disease with disc height loss at L3-4, L4-5 and L5-S1. Articular cartilage and labrum Articular cartilage: High-grade partial-thickness cartilage loss of the left femoral head and acetabulum. Partial-thickness cartilage loss of the right femoral head and acetabulum. Bilateral inferior femoral head marginal osteophytes. Labrum: Grossly intact, but evaluation is limited by lack of intraarticular fluid. Joint or bursal effusion Joint effusion: Small left hip joint effusion. No right hip joint effusion. No SI joint effusion. Bursae:  No bursa formation. Muscles and tendons Flexors: Normal. Extensors: Normal. Abductors: Normal. Adductors: Normal. Gluteals: Normal. Hamstrings: Mild tendinosis of the left hamstring origin. Small partial tear of the right hamstring origin. Other findings Miscellaneous: No pelvic free fluid. No fluid collection or hematoma. No inguinal lymphadenopathy. No inguinal hernia. Incidental note made of a left pelvic kidney. IMPRESSION: 1. No hip fracture, dislocation or avascular necrosis. 2. Moderate osteoarthritis of bilateral hips, left greater than right. Small left hip joint effusion. 3. Mild tendinosis of the left hamstring origin. 4. Small partial tear of the right hamstring origin. 5. Degenerative disc disease with disc height loss at L3-4, L4-5 and L5-S1. Electronically Signed   By: HKathreen Devoid  On: 08/25/2016 15:13        Scheduled Meds: . diltiazem  30 mg Oral Q6H  . DULoxetine  30 mg Oral Daily  . feeding supplement (ENSURE ENLIVE)  237 mL Oral BID BM  . fludrocortisone  0.1 mg Oral Daily  . guaiFENesin  600 mg Oral BID  . iopamidol  10 mL Intra-articular Once  . iopamidol        . lidocaine (PF)  5 mL Intradermal Once  . lidocaine      . mometasone-formoterol  2 puff Inhalation BID  . senna  1 tablet Oral BID   Continuous Infusions: . azithromycin Stopped (08/27/16 1126)  . cefTRIAXone (ROCEPHIN)  IV 1 g (08/27/16 1152)  . heparin Stopped (08/27/16 0900)  . sodium phosphate  Dextrose 5% IVPB Stopped (08/27/16 0053)     LOS:  1 day    Time spent: 30 minutes.     Hosie Poisson, MD [facility] Pager [phone]  If 7PM-7AM, please contact night-coverage [email or web address] Password [email or web address] 08/27/2016, 1:07 PM

## 2016-08-28 LAB — CBC
HCT: 33.2 % — ABNORMAL LOW (ref 36.0–46.0)
Hemoglobin: 10.8 g/dL — ABNORMAL LOW (ref 12.0–15.0)
MCH: 29.7 pg (ref 26.0–34.0)
MCHC: 32.5 g/dL (ref 30.0–36.0)
MCV: 91.2 fL (ref 78.0–100.0)
PLATELETS: 157 10*3/uL (ref 150–400)
RBC: 3.64 MIL/uL — AB (ref 3.87–5.11)
RDW: 15.3 % (ref 11.5–15.5)
WBC: 6.7 10*3/uL (ref 4.0–10.5)

## 2016-08-28 LAB — APTT: aPTT: 37 seconds — ABNORMAL HIGH (ref 24–36)

## 2016-08-28 NOTE — Progress Notes (Signed)
ANTICOAGULATION CONSULT NOTE - Follow Up Consult  Pharmacy Consult for Eliquis Indication: atrial fibrillation  Allergies  Allergen Reactions  . Adhesive [Tape] Other (See Comments)    Caused blisters  . Amoxicillin Other (See Comments)    Has patient had a PCN reaction causing immediate rash, facial/tongue/throat swelling, SOB or lightheadedness with hypotension: Yes Has patient had a PCN reaction causing severe rash involving mucus membranes or skin necrosis: No Has patient had a PCN reaction that required hospitalization No Has patient had a PCN reaction occurring within the last 10 years: Yes If all of the above answers are "NO", then may proceed with Cephalosporin use.   Caused thrush  . Ciprofloxacin Other (See Comments)    Caused thrush  . Ultram [Tramadol] Other (See Comments)    hypotension    Patient Measurements: Height: '5\' 10"'$  (177.8 cm) Weight: 155 lb (70.3 kg) IBW/kg (Calculated) : 68.5  Vital Signs: BP: 126/74 (05/10 1150) Pulse Rate: 74 (05/10 1150)  Labs:  Recent Labs  08/25/16 1924  08/26/16 0345 08/26/16 1336 08/26/16 2054 08/27/16 0522 08/28/16 0313  HGB  --   < > 11.5*  --   --  11.3* 10.8*  HCT  --   --  34.7*  --   --  34.9* 33.2*  PLT  --   --  152  --   --  142* 157  APTT 37*  --  62* 72* 39* 94* 37*  HEPARINUNFRC 1.26*  --   --  0.93*  --  0.75*  --   CREATININE  --   --  0.68  --   --   --   --   < > = values in this interval not displayed.  Estimated Creatinine Clearance: 54.6 mL/min (by C-G formula based on SCr of 0.68 mg/dL).   Medications:    Assessment: 19 yOF on Eliquis PTA for afib, which was on hold for arthrocentesis. She was on IV heparin prior to procedure. Eliquis has already been resumed yesterday after procedure. '5mg'$  BID as home dose is appropriate. CBC stable.  Goal of Therapy:   Monitor platelets by anticoagulation protocol: Yes   Plan:  - Continue Eliquis '5mg'$  po BID as ordered.   Maryanna Shape, PharmD, BCPS   Clinical Pharmacist  Pager: [phone]   08/28/2016,6:26 PM

## 2016-08-28 NOTE — Clinical Social Work Note (Signed)
Clinical Social Work Assessment  Patient Details  Name: [name] MRN: [id number] Date of Birth: 10/30/1927  Date of referral:  08/28/16               Reason for consult:  Discharge Planning                Permission sought to share information with:  Family Supports Permission granted to share information::  Yes, Verbal Permission Granted  Name::     Jackie Leonard  Agency::     Relationship::  daughter  Contact Information:  [phone]  Housing/Transportation Living arrangements for the past 2 months:  Apartment Source of Information:  Patient Patient Interpreter Needed:  None Criminal Activity/Legal Involvement Pertinent to Current Situation/Hospitalization:  No - Comment as needed Significant Relationships:  Adult Children Lives with:  Self Do you feel safe going back to the place where you live?  Yes Need for family participation in patient care:  No (Coment)  Care giving concerns:  Patient lives alone in her two story town house. Patient adult children are supportive but unfortunately they do not live in town. Patient stated that her friends are helpful but she can not rely on them to do everything for her.  Social Worker assessment / plan: Clinical Social Worker met patient at bedside to offer support and discuss discharge needs. Patient stated she lives alone and has support from friends but they are unable to do everything for her. Patient is agreeable to discharge to SNF but would like daughter to make decision on where she will go to SNF. CSW to complete necessary paperwork and initiate SNF search on patient behalf. CSW to follow up once bed offers are available. CSW remains available for support and to facilitate patients discharge needs once medically ready.   Employment status:  Retired Insurance information:  Medicare PT Recommendations:  Skilled Nursing Facility Information / Referral to community resources:  Skilled Nursing Facility  Patient/Family's  Response to care:  Patient verbalized appreciation and understanding for CSW role and involvement in care. Patients daughter very happy that patient has agreed to go to SNF  Patient/Family's Understanding of and Emotional Response to Diagnosis, Current Treatment, and Prognosis:  Patient with good understanding of current medical state and limitations around most recent hospitalization.  Emotional Assessment Appearance:  Appears stated age Attitude/Demeanor/Rapport:  Other Affect (typically observed):  Pleasant Orientation:  Oriented to Self, Oriented to Situation, Oriented to Place, Oriented to  Time Alcohol / Substance use:  Not Applicable Psych involvement (Current and /or in the community):  No (Comment)  Discharge Needs  Concerns to be addressed:  No discharge needs identified Readmission within the last 30 days:  No Current discharge risk:  None, Lives alone Barriers to Discharge:  No Barriers Identified    C , LCSW 08/28/2016, 4:25 PM  

## 2016-08-28 NOTE — Clinical Social Work Placement (Signed)
   CLINICAL SOCIAL WORK PLACEMENT  NOTE  Date:  08/28/2016  Patient Details  Name: CHEALSEY MIYAMOTO MRN: [id number] Date of Birth: Jan 02, 1928  Clinical Social Work is seeking post-discharge placement for this patient at the [place] level of care (*CSW will initial, date and re-position this form in  chart as items are completed):      Patient/family provided with [facility] Work Department's list of facilities offering this level of care within the geographic area requested by the patient (or if unable, by the patient's family).  Yes   Patient/family informed of their freedom to choose among providers that offer the needed level of care, that participate in Medicare, Medicaid or managed care program needed by the patient, have an available bed and are willing to accept the patient.  Yes   Patient/family informed of [facility]'s ownership interest in [facility] and [facility], as well as of the fact that they are under no obligation to receive care at these facilities.  PASRR submitted to EDS on       PASRR number received on       Existing PASRR number confirmed on       FL2 transmitted to all facilities in geographic area requested by pt/family on       FL2 transmitted to all facilities within larger geographic area on       Patient informed that his/her managed care company has contracts with or will negotiate with certain facilities, including the following:        Yes   Patient/family informed of bed offers received.  Patient chooses bed at       Physician recommends and patient chooses bed at      Patient to be transferred to   on  .  Patient to be transferred to facility by       Patient family notified on   of transfer.  Name of family member notified:        PHYSICIAN Please prepare prescriptions, Please sign FL2, Please prepare priority discharge summary, including medications     Additional Comment:     _______________________________________________ Wende Neighbors, LCSW 08/28/2016, 4:58 PM

## 2016-08-28 NOTE — NC FL2 (Signed)
[place] LEVEL OF CARE SCREENING TOOL     IDENTIFICATION  Patient Name: [name] Birthdate: 17-Dec-1927 Sex: female Admission Date (Current Location): 08/25/2016  [facility] and [place] Number:  Herbalist and Address:  [facility] [place], [place] [address], [place], [place] [address]      Provider Number: [id number]  Attending Physician Name and Address:  Hosie Poisson, MD  Relative Name and Phone Number:       Current Level of Care: Hospital Recommended Level of Care: [place] Prior Approval Number:    Date Approved/Denied:   PASRR Number: [id number]  Discharge Plan: SNF    Current Diagnoses: Patient Active Problem List   Diagnosis Date Noted  . Hypokalemia 08/25/2016  . Fever 08/25/2016  . Failure to thrive in adult 04/01/2016  . Blurry vision, right eye   . PCP NOTES >>>>>>>>>>>>>>>>>>>>>>> 03/02/2015  . Atrial fibrillation with RVR ([place]) 01/31/2015  . COPD (chronic obstructive pulmonary disease) ([place]) 01/31/2015  . Lower abdominal pain 12/05/2014  . Other fatigue 06/21/2014  . Lightheadedness 06/12/2014  . Fall from other slipping, tripping, or stumbling 11/17/2013  . COPD exacerbation ([place]) 09/26/2013  . Musculoskeletal pain 05/21/2012  . IBS (irritable bowel syndrome) 04/02/2012  . Physical exam, annual 11/25/2011  . Anxiety and depression 07/10/2011  . Parkinson's disease ([place]) 02/03/2011  . Shoulder pain 02/03/2011  . Recurrent UTI 11/01/2010  . GERD (gastroesophageal reflux disease) 11/01/2010  . Memory loss 11/01/2010  . Hip pain 09/23/2010  . COMPRESSION FRACTURE, LUMBAR VERTEBRAE 03/28/2010  . Abdominal pain 03/27/2010  . DIZZINESS 03/22/2010  . Mycotic toenails 10/02/2009  . DYSPNEA ON EXERTION 01/19/2009  . HEMORRHOIDS-INTERNAL 02/14/2008  . Constipation 02/14/2008  . PERSONAL HX COLONIC POLYPS 02/14/2008    Orientation RESPIRATION BLADDER Height & Weight     Self, Time,  Situation, Place  Normal Incontinent Weight: 155 lb (70.3 kg) Height:  '5\' 10"'$  (177.8 cm)  BEHAVIORAL SYMPTOMS/MOOD NEUROLOGICAL BOWEL NUTRITION STATUS      Continent Diet (heart healthy)  AMBULATORY STATUS COMMUNICATION OF NEEDS Skin   Limited Assist Verbally Normal                       Personal Care Assistance Level of Assistance  Bathing, Feeding, Dressing Bathing Assistance: Limited assistance Feeding assistance: Independent Dressing Assistance: Limited assistance     Functional Limitations Info  Sight, Hearing, Speech Sight Info: Adequate Hearing Info: Impaired Speech Info: Adequate    SPECIAL CARE FACTORS FREQUENCY  PT (By licensed PT), OT (By licensed OT)     PT Frequency: 5x wk OT Frequency: 5x wk            Contractures Contractures Info: Not present    Additional Factors Info  Code Status Code Status Info: Full Code             Current Medications (08/28/2016):  This is the current hospital active medication list Current Facility-Administered Medications  Medication Dose Route Frequency Provider Last Rate Last Dose  . acetaminophen (TYLENOL) tablet 650 mg  650 mg Oral Q6H PRN Toy Baker, MD       Or  . acetaminophen (TYLENOL) suppository 650 mg  650 mg Rectal Q6H PRN Doutova, Anastassia, MD      . albuterol (PROVENTIL) (2.5 MG/3ML) 0.083% nebulizer solution 2.5 mg  2.5 mg Nebulization Q2H PRN Doutova, Anastassia, MD      . apixaban (ELIQUIS) tablet 5 mg  5 mg  Oral BID Hosie Poisson, MD   5 mg at 08/28/16 1015  . azithromycin (ZITHROMAX) 500 mg in dextrose 5 % 250 mL IVPB  500 mg Intravenous Q24H Hosie Poisson, MD   Stopped at 08/28/16 1308  . cefTRIAXone (ROCEPHIN) 1 g in dextrose 5 % 50 mL IVPB  1 g Intravenous Q24H Hosie Poisson, MD 100 mL/hr at 08/28/16 1315 1 g at 08/28/16 1315  . diltiazem (CARDIZEM) tablet 30 mg  30 mg Oral Q6H Doutova, Anastassia, MD   30 mg at 08/28/16 1314  . DULoxetine (CYMBALTA) DR capsule 30 mg  30 mg Oral  Daily Doutova, Anastassia, MD   30 mg at 08/28/16 1015  . feeding supplement (ENSURE ENLIVE) (ENSURE ENLIVE) liquid 237 mL  237 mL Oral BID BM Hosie Poisson, MD   237 mL at 08/28/16 1500  . fludrocortisone (FLORINEF) tablet 0.1 mg  0.1 mg Oral Daily Doutova, Anastassia, MD   0.1 mg at 08/28/16 1015  . guaiFENesin (MUCINEX) 12 hr tablet 600 mg  600 mg Oral BID Toy Baker, MD   600 mg at 08/28/16 1015  . HYDROcodone-acetaminophen (NORCO/VICODIN) 5-325 MG per tablet 1-2 tablet  1-2 tablet Oral Q4H PRN Toy Baker, MD   1 tablet at 08/28/16 1208  . ipratropium (ATROVENT) nebulizer solution 0.5 mg  0.5 mg Nebulization Q6H PRN Doutova, Anastassia, MD      . methocarbamol (ROBAXIN) tablet 500 mg  500 mg Oral Q8H PRN Doutova, Anastassia, MD      . mometasone-formoterol (DULERA) 200-5 MCG/ACT inhaler 2 puff  2 puff Inhalation BID Toy Baker, MD   2 puff at 08/28/16 1001  . ondansetron (ZOFRAN) tablet 4 mg  4 mg Oral Q6H PRN Toy Baker, MD       Or  . ondansetron (ZOFRAN) injection 4 mg  4 mg Intravenous Q6H PRN Doutova, Anastassia, MD      . polyethylene glycol (MIRALAX / GLYCOLAX) packet 17 g  17 g Oral Daily PRN Doutova, Anastassia, MD      . senna (SENOKOT) tablet 8.6 mg  1 tablet Oral BID Toy Baker, MD   8.6 mg at 08/28/16 1015     Discharge Medications: Please see discharge summary for a list of discharge medications.  Relevant Imaging Results:  Relevant Lab Results:   Additional Information SS#[id number]  Wende Neighbors, LCSW

## 2016-08-28 NOTE — Progress Notes (Signed)
PROGRESS NOTE    [name]  VOZ:[id number] DOB: May 26, 1927 DOA: 08/25/2016 PCP: Colon Branch, MD    Brief Narrative: [name] is a 83 y.o. female with medical history significant of a.fib on Eliquis, Parkinson disease, arthritis recurrent UTI, Presented with left hip and left leg pain since one day admission.   Assessment & Plan:   Active Problems:   Parkinson's disease ([place])   Atrial fibrillation with RVR (HCC)   COPD (chronic obstructive pulmonary disease) (HCC)   Hypokalemia   Fever    Left Hip Pain:  Possibly neuropathy, ? Chronic. DG fluro guided needle aspiration of the hip joint, done ,. Start eliquis tonight. Pain control. Cultures negative  so far , rare wbc , pending cultures. Recommend watching cultures for another 24 hours and if negative plan for d/c in am .  PT evaluation recommended SNF placement.  Social worker consulted .    Hypokalemia: replaced.   Hypophosphatemia:  Replace.  Atrial fibrillation: with RVR on admission.  Currently rate controlled and on diltiazem.  Resume Eliquis.   Fever: cough, ? CAP. CXR Mild atelectasis at the bases with superimposed infection not excluded. Started on rocephin and zithromax. Not requiring oxygen.  Cough is better, breathing is better.  would continue with antibiotics and get repeat CXR in 48 hours .   COPD: Stable. No wheezing .   Mild normocytic anemia. Stable hemoglobin at 11.      DVT prophylaxis: eliquis Code Status: full code) Family Communication: none at bedside Disposition Plan: SNF in am if cultures are negative.    Consultants:   Cardiology.   Procedures: none.   Antimicrobials: None.  Subjective: Hip pain is better, but referred pain down the leg. .   Objective: Vitals:   08/28/16 0340 08/28/16 0600 08/28/16 1001 08/28/16 1150  BP: 137/84 125/88  126/74  Pulse: 81 74  74  Resp: '18 19  18  '$ Temp: 98.5 F (36.9 C) 98.7 F (37.1 C)    TempSrc: Oral Oral      SpO2: 92% 97% 95% 99%  Weight:      Height:        Intake/Output Summary (Last 24 hours) at 08/28/16 1822 Last data filed at 08/28/16 0313  Gross per 24 hour  Intake              300 ml  Output                0 ml  Net              300 ml   Filed Weights   08/25/16 0903  Weight: 70.3 kg (155 lb)    Examination:  General exam: Appears calm and comfortable  Respiratory system: Clear to auscultation. Respiratory effort normal. Cardiovascular system: S1 & S2 heard, RRR. No JVD, murmurs, rubs, gallops or clicks. No pedal edema. Gastrointestinal system: Abdomen is nondistended, soft and nontender. No organomegaly or masses felt. Normal bowel sounds heard. Central nervous system: Alert and oriented. No focal neurological deficits. Extremities: no pedal edema. Hip tenderness present.  Skin: No rashes, lesions or ulcers     Data Reviewed: I have personally reviewed following labs and imaging studies  CBC:  Recent Labs Lab 08/25/16 0942 08/26/16 0345 08/27/16 0522 08/28/16 0313  WBC 9.7 7.4 7.2 6.7  NEUTROABS 8.0*  --   --   --   HGB 12.4 11.5* 11.3* 10.8*  HCT 38.5 34.7* 34.9* 33.2*  MCV 92.5 90.8 90.9  91.2  PLT 176 152 142* 937   Basic Metabolic Panel:  Recent Labs Lab 08/25/16 0942 08/26/16 0345  NA 140 138  K 3.3* 3.3*  CL 100* 103  CO2 30 27  GLUCOSE 101* 91  BUN 10 9  CREATININE 0.70 0.68  CALCIUM 9.1 8.7*  MG  --  1.8  PHOS  --  2.3*   GFR: Estimated Creatinine Clearance: 54.6 mL/min (by C-G formula based on SCr of 0.68 mg/dL). Liver Function Tests:  Recent Labs Lab 08/26/16 0345  AST 21  ALT 16  ALKPHOS 68  BILITOT 1.4*  PROT 5.8*  ALBUMIN 3.2*   No results for input(s): LIPASE, AMYLASE in the last 168 hours. No results for input(s): AMMONIA in the last 168 hours. Coagulation Profile: No results for input(s): INR, PROTIME in the last 168 hours. Cardiac Enzymes: No results for input(s): CKTOTAL, CKMB, CKMBINDEX, TROPONINI in the last  168 hours. BNP (last 3 results) No results for input(s): PROBNP in the last 8760 hours. HbA1C: No results for input(s): HGBA1C in the last 72 hours. CBG: No results for input(s): GLUCAP in the last 168 hours. Lipid Profile: No results for input(s): CHOL, HDL, LDLCALC, TRIG, CHOLHDL, LDLDIRECT in the last 72 hours. Thyroid Function Tests:  Recent Labs  08/26/16 0345  TSH 2.061   Anemia Panel: No results for input(s): VITAMINB12, FOLATE, FERRITIN, TIBC, IRON, RETICCTPCT in the last 72 hours. Sepsis Labs:  Recent Labs Lab 08/25/16 1696  LATICACIDVEN 1.4    Recent Results (from the past 240 hour(s))  Blood culture (routine x 2)     Status: None (Preliminary result)   Collection Time: 08/25/16  9:30 AM  Result Value Ref Range Status   Specimen Description BLOOD LEFT FOREARM  Final   Special Requests   Final    BOTTLES DRAWN AEROBIC AND ANAEROBIC Blood Culture adequate volume   Culture NO GROWTH 3 DAYS  Final   Report Status PENDING  Incomplete  Blood culture (routine x 2)     Status: None (Preliminary result)   Collection Time: 08/25/16  9:42 AM  Result Value Ref Range Status   Specimen Description BLOOD LEFT FOREARM  Final   Special Requests   Final    BOTTLES DRAWN AEROBIC ONLY Blood Culture adequate volume   Culture NO GROWTH 3 DAYS  Final   Report Status PENDING  Incomplete  Respiratory Panel by PCR     Status: None   Collection Time: 08/25/16  7:24 PM  Result Value Ref Range Status   Adenovirus NOT DETECTED NOT DETECTED Final   Coronavirus 229E NOT DETECTED NOT DETECTED Final   Coronavirus HKU1 NOT DETECTED NOT DETECTED Final   Coronavirus NL63 NOT DETECTED NOT DETECTED Final   Coronavirus OC43 NOT DETECTED NOT DETECTED Final   Metapneumovirus NOT DETECTED NOT DETECTED Final   Rhinovirus / Enterovirus NOT DETECTED NOT DETECTED Final   Influenza A NOT DETECTED NOT DETECTED Final   Influenza B NOT DETECTED NOT DETECTED Final   Parainfluenza Virus 1 NOT DETECTED  NOT DETECTED Final   Parainfluenza Virus 2 NOT DETECTED NOT DETECTED Final   Parainfluenza Virus 3 NOT DETECTED NOT DETECTED Final   Parainfluenza Virus 4 NOT DETECTED NOT DETECTED Final   Respiratory Syncytial Virus NOT DETECTED NOT DETECTED Final   Bordetella pertussis NOT DETECTED NOT DETECTED Final   Chlamydophila pneumoniae NOT DETECTED NOT DETECTED Final   Mycoplasma pneumoniae NOT DETECTED NOT DETECTED Final  Body fluid culture     Status:  None (Preliminary result)   Collection Time: 08/27/16  1:42 PM  Result Value Ref Range Status   Specimen Description SYNOVIAL LEFT HIP  Final   Special Requests NONE  Final   Gram Stain   Final    RARE WBC PRESENT, PREDOMINANTLY PMN NO ORGANISMS SEEN    Culture NO GROWTH < 24 HOURS  Final   Report Status PENDING  Incomplete         Radiology Studies: Dg Fluoro Guided Needle Plc Aspiration/injection Loc  Result Date: 08/27/2016 CLINICAL DATA:  Left hip pain.  Rule out infection. EXAM: Left HIP ASPIRATION UNDER FLUOROSCOPY FLUOROSCOPY TIME:  Fluoroscopy Time:  0 minutes 24 seconds CT Radiation Exposure Index (if provided by the fluoroscopic device): Number of Acquired Spot Images: 0 PROCEDURE: Overlying skin prepped with Betadine, draped in the usual sterile fashion, and infiltrated locally with buffered Lidocaine. Curved 20 gauge spinal needle advanced to the superolateral margin of the left femoral head. 1 ml of Lidocaine injected easily. Diagnostic injection of iodinated contrast demonstrates intra-articular spread without intravascular component. No significant joint fluid was aspirated. The patient was small joint effusion on left hip MRI 2 days ago. Contrast injected confirming intra-articular location of the needle. 2 mL of fluid were then aspirated from the joint. IMPRESSION: Technically successful left hip aspiration under fluoroscopy. Electronically Signed   By: Franchot Gallo M.D.   On: 08/27/2016 13:59        Scheduled Meds: .  apixaban  5 mg Oral BID  . diltiazem  30 mg Oral Q6H  . DULoxetine  30 mg Oral Daily  . feeding supplement (ENSURE ENLIVE)  237 mL Oral BID BM  . fludrocortisone  0.1 mg Oral Daily  . guaiFENesin  600 mg Oral BID  . mometasone-formoterol  2 puff Inhalation BID  . senna  1 tablet Oral BID   Continuous Infusions: . azithromycin Stopped (08/28/16 1308)  . cefTRIAXone (ROCEPHIN)  IV Stopped (08/28/16 1345)     LOS: 2 days    Time spent: 30 minutes.     Hosie Poisson, MD [facility] Pager [phone]  If 7PM-7AM, please contact night-coverage [email or web address] Password [email or web address] 08/28/2016, 6:22 PM

## 2016-08-29 ENCOUNTER — Telehealth: Payer: Self-pay | Admitting: Internal Medicine

## 2016-08-29 DIAGNOSIS — M25552 Pain in left hip: Secondary | ICD-10-CM | POA: Diagnosis not present

## 2016-08-29 DIAGNOSIS — I482 Chronic atrial fibrillation: Secondary | ICD-10-CM | POA: Diagnosis not present

## 2016-08-29 DIAGNOSIS — I4891 Unspecified atrial fibrillation: Secondary | ICD-10-CM

## 2016-08-29 DIAGNOSIS — J189 Pneumonia, unspecified organism: Secondary | ICD-10-CM | POA: Diagnosis not present

## 2016-08-29 DIAGNOSIS — R2681 Unsteadiness on feet: Secondary | ICD-10-CM | POA: Diagnosis not present

## 2016-08-29 DIAGNOSIS — G20 Parkinson's disease: Secondary | ICD-10-CM | POA: Diagnosis not present

## 2016-08-29 DIAGNOSIS — I951 Orthostatic hypotension: Secondary | ICD-10-CM | POA: Diagnosis not present

## 2016-08-29 DIAGNOSIS — R41841 Cognitive communication deficit: Secondary | ICD-10-CM | POA: Diagnosis not present

## 2016-08-29 DIAGNOSIS — F329 Major depressive disorder, single episode, unspecified: Secondary | ICD-10-CM | POA: Diagnosis not present

## 2016-08-29 DIAGNOSIS — M6281 Muscle weakness (generalized): Secondary | ICD-10-CM | POA: Diagnosis not present

## 2016-08-29 DIAGNOSIS — J42 Unspecified chronic bronchitis: Secondary | ICD-10-CM | POA: Diagnosis not present

## 2016-08-29 DIAGNOSIS — R509 Fever, unspecified: Secondary | ICD-10-CM | POA: Diagnosis not present

## 2016-08-29 DIAGNOSIS — E876 Hypokalemia: Secondary | ICD-10-CM | POA: Diagnosis not present

## 2016-08-29 DIAGNOSIS — J449 Chronic obstructive pulmonary disease, unspecified: Secondary | ICD-10-CM | POA: Diagnosis not present

## 2016-08-29 DIAGNOSIS — R278 Other lack of coordination: Secondary | ICD-10-CM | POA: Diagnosis not present

## 2016-08-29 DIAGNOSIS — I480 Paroxysmal atrial fibrillation: Secondary | ICD-10-CM | POA: Diagnosis not present

## 2016-08-29 DIAGNOSIS — M79605 Pain in left leg: Secondary | ICD-10-CM | POA: Diagnosis not present

## 2016-08-29 DIAGNOSIS — K5900 Constipation, unspecified: Secondary | ICD-10-CM | POA: Diagnosis not present

## 2016-08-29 LAB — CBC
HEMATOCRIT: 34.5 % — AB (ref 36.0–46.0)
HEMOGLOBIN: 11 g/dL — AB (ref 12.0–15.0)
MCH: 29.5 pg (ref 26.0–34.0)
MCHC: 31.9 g/dL (ref 30.0–36.0)
MCV: 92.5 fL (ref 78.0–100.0)
Platelets: 167 10*3/uL (ref 150–400)
RBC: 3.73 MIL/uL — AB (ref 3.87–5.11)
RDW: 15.3 % (ref 11.5–15.5)
WBC: 7 10*3/uL (ref 4.0–10.5)

## 2016-08-29 LAB — APTT: APTT: 39 s — AB (ref 24–36)

## 2016-08-29 MED ORDER — CEFPODOXIME PROXETIL 200 MG PO TABS: 200.0000 mg | ORAL_TABLET | Freq: Two times a day (BID) | ORAL | Status: DC

## 2016-08-29 MED ORDER — CEFPODOXIME PROXETIL 200 MG PO TABS: 200.0000 mg | ORAL_TABLET | Freq: Two times a day (BID) | ORAL | 0 refills | Status: DC

## 2016-08-29 MED ORDER — ACETAMINOPHEN 325 MG PO TABS: 650.0000 mg | ORAL_TABLET | Freq: Four times a day (QID) | ORAL | Status: AC | PRN

## 2016-08-29 NOTE — Progress Notes (Signed)
Physical Therapy Treatment Patient Details Name: [name] MRN: [id number] DOB: Oct 25, 1927 Today's Date: 08/29/2016    History of Present Illness 83 y.o. female with medical history significant of a.fib on Eliquis, Parkinson disease, arthritis recurrent UTI, Presented with left hip and left leg pain since one day admission    PT Comments    Pt very pleasant and eager to move and return to PLOF. Pt with greatly increased ambulation tolerance and balance today. Pt needs constant cues for safety with RW and gait. Pt will continue to benefit from therapy to maximize independence and gait. Declined further mobility currently as pt desiring bathing prior to D/C   Follow Up Recommendations  SNF;Supervision/Assistance - 24 hour     Equipment Recommendations       Recommendations for Other Services       Precautions / Restrictions Precautions Precautions: Fall    Mobility  Bed Mobility               General bed mobility comments: received in chair  Transfers Overall transfer level: Modified independent                  Ambulation/Gait Ambulation/Gait assistance: Min guard Ambulation Distance (Feet): 300 Feet Assistive device: Rolling walker (2 wheeled) Gait Pattern/deviations: Step-through pattern;Decreased stride length;Trunk flexed   Gait velocity interpretation: Below normal speed for age/gender General Gait Details: max cues to step into RW, extend trunk and cues for direction to return to room   Stairs            Wheelchair Mobility    Modified Rankin (Stroke Patients Only)       Balance     Sitting balance-Leahy Scale: Good       Standing balance-Leahy Scale: Fair Standing balance comment: 3 min standing with single UE support at sink                            Cognition Arousal/Alertness: Awake/alert Behavior During Therapy: WFL for tasks assessed/performed Overall Cognitive Status: History of cognitive  impairments - at baseline                                 General Comments: patient with declining memory function, and difficulty with word finding at times      Exercises      General Comments        Pertinent Vitals/Pain Pain Assessment: No/denies pain    Home Living                      Prior Function            PT Goals (current goals can now be found in the care plan section) Progress towards PT goals: Progressing toward goals    Frequency           PT Plan Current plan remains appropriate    Co-evaluation              AM-PAC PT "6 Clicks" Daily Activity  Outcome Measure  Difficulty turning over in bed (including adjusting bedclothes, sheets and blankets)?: A Little Difficulty moving from lying on back to sitting on the side of the bed? : A Little Difficulty sitting down on and standing up from a chair with arms (e.g., wheelchair, bedside commode, etc,.)?: None Help needed moving to and from a bed to chair (including  a wheelchair)?: A Little Help needed walking in hospital room?: A Little Help needed climbing 3-5 steps with a railing? : A Little 6 Click Score: 19    End of Session Equipment Utilized During Treatment: Gait belt Activity Tolerance: Patient tolerated treatment well Patient left: in chair;with call bell/phone within reach Nurse Communication: Mobility status PT Visit Diagnosis: Unsteadiness on feet (R26.81)     Time: 0488-8916 PT Time Calculation (min) (ACUTE ONLY): 15 min  Charges:  $Gait Training: 8-22 mins                    G Codes:       Elwyn Reach, PT [phone]    [place] 08/29/2016, 12:57 PM

## 2016-08-29 NOTE — Progress Notes (Signed)
Report called to nurse Kenney Houseman at [facility] place.

## 2016-08-29 NOTE — Care Management Note (Signed)
Case Management Note Marvetta Gibbons RN, BSN Unit 2W-Case Manager [phone]  Patient Details  Name: COOPER STAMP MRN: [id number] Date of Birth: 1927-12-16  Subjective/Objective:   Pt admitted with afib/pna                 Action/Plan: PTA pt lived at home- per PT eval recommendation for SNF- CSW consulted for placement needs.   Expected Discharge Date:  08/29/16               Expected Discharge Plan:  Skilled Nursing Facility  In-House Referral:  Clinical Social Work  Discharge planning Services  CM Consult  Post Acute Care Choice:    Choice offered to:     DME Arranged:    DME Agency:     HH Arranged:    [place] Agency:     Status of Service:  Completed, signed off  If discussed at [employer] of Stay Meetings, dates discussed:    Discharge Disposition: skilled facility   Additional Comments:  Dawayne Sydne, RN 08/29/2016, 1:52 PM

## 2016-08-29 NOTE — Discharge Summary (Signed)
Physician Discharge Summary  [name] UXN:[id number] DOB: 10-21-1927 DOA: 08/25/2016  PCP: Colon Branch, MD  Admit date: 08/25/2016 Discharge date: 08/29/2016  Admitted From: Home Disposition: [facility]   Recommendations for Outpatient Follow-up:  1. Follow up with PCP in 1-2 weeks 2. Follow up in 2 weeks with Dr. Einar Gip. 3. Please obtain BMP/CBC in one week 4. Please follow up on the following pending results: 1. Final read on synovial fluid culture  Home Health: N/A Equipment/Devices: N/A Discharge Condition: Stable CODE STATUS: Full Diet recommendation: Heart healthy  Brief/Interim Summary: [name] a 83 y.o.femalewith medical history significant of PAF on Eliquis, Parkinson disease, arthritis, and recurrent UTI who presented withleft hip and left leg pain since one day PTA. She was found to have low grade fever and started on antibiotics for CAP with infiltrate not ruled out by CXR. UA was negative. Left hip was tapped and gram stain negative as well as culture negative x48 hours. She has worked with PT who recommends SNF at discharge. Pt lives alone.   Discharge Diagnoses:  Active Problems:   Parkinson's disease ([place])   Atrial fibrillation with RVR (HCC)   COPD (chronic obstructive pulmonary disease) (HCC)   Hypokalemia   Fever  Left Hip Pain: Acute on chronic. Possibly neuropathy. DG fluro guided needle aspiration of the hip joint, done with no organisms on gram stain and cultures negative to date (48hrs) - Recommend tylenol as needed for pain, NSAIDs if still uncontrolled pain, though prefer to minimize this given eliquis. - Otherwise functional status is good, but will require discharge to SNF per PT recommendations. Plan to DC to [facility]   Hypokalemia: Replaced. - Recheck in 1 week   Hypophosphatemia: Replaced. - Recheck in 1 week  Paroxysmal atrial fibrillation: with RVR on admission: Currently rate controlled. Has significant  symptoms in the past. - Continue diltiazem.  - Resumed Eliquis. CHA2DS2-VASc Scoreis 3.   - Follow up in 2 weeks with Dr. Einar Gip.  Community-acquired pneumonia. CXR Mild atelectasis at the bases with superimposed infection not excluded + fever + cough. Started on rocephin and zithromax. Not requiring oxygen. Cough is better, breathing is better.   - s/p azithromycin '500mg'$  x3 days - Continue vantin (tolerated ceftriaxone) to complete 14 day course (started 5/8) - Consider recheck of CXR if symptoms recur.  Orthostatic hypotension:  - Continue florinef  COPD: Stable. No wheezing. - Home medications: symbicort and prn albuterol  Mild normocytic anemia. Stable hemoglobin at 11.  - Consider recheck in 1 week  Parkinson's disease:  - Continue PT  Discharge Instructions  Allergies as of 08/29/2016      Reactions   Adhesive [tape] Other (See Comments)   Caused blisters   Amoxicillin Other (See Comments)   Has patient had a PCN reaction causing immediate rash, facial/tongue/throat swelling, SOB or lightheadedness with hypotension: Yes Has patient had a PCN reaction causing severe rash involving mucus membranes or skin necrosis: No Has patient had a PCN reaction that required hospitalization No Has patient had a PCN reaction occurring within the last 10 years: Yes If all of the above answers are "NO", then may proceed with Cephalosporin use. Caused thrush   Ciprofloxacin Other (See Comments)   Caused thrush   Ultram [tramadol] Other (See Comments)   hypotension      Medication List    STOP taking these medications   fluticasone 50 MCG/ACT nasal spray Commonly known as:  FLONASE   meclizine 25 MG  tablet Commonly known as:  ANTIVERT     TAKE these medications   acetaminophen 325 MG tablet Commonly known as:  TYLENOL Take 2 tablets (650 mg total) by mouth every 6 (six) hours as needed for mild pain (or Fever >/= 101).   albuterol 108 (90 Base) MCG/ACT inhaler Commonly  known as:  VENTOLIN HFA Inhale 2 puffs into the lungs 2 (two) times daily as needed for wheezing or shortness of breath.   apixaban 5 MG Tabs tablet Commonly known as:  ELIQUIS Take 5 mg by mouth 2 (two) times daily.   azelastine 0.1 % nasal spray Commonly known as:  ASTELIN Place 2 sprays into both nostrils at bedtime as needed for rhinitis. Use in each nostril as directed   cefpodoxime 200 MG tablet Commonly known as:  VANTIN Take 1 tablet (200 mg total) by mouth every 12 (twelve) hours. Start taking on:  08/30/2016   cholecalciferol 1000 units tablet Commonly known as:  VITAMIN D Take 1,000 Units by mouth daily.   diltiazem 30 MG tablet Commonly known as:  CARDIZEM Take 1 tablet (30 mg total) by mouth 4 (four) times daily. Take at onset on atrial fibrillation   DULoxetine 30 MG capsule Commonly known as:  CYMBALTA Take 1 capsule (30 mg total) by mouth daily.   fludrocortisone 0.1 MG tablet Commonly known as:  FLORINEF Take 0.1 mg by mouth daily.   polyethylene glycol powder powder Commonly known as:  GLYCOLAX/MIRALAX Take 17 g by mouth 2 (two) times daily. What changed:  when to take this   SYMBICORT 160-4.5 MCG/ACT inhaler Generic drug:  budesonide-formoterol INHALE 2 PUFFS BY MOUTH INTO THE LUNGS TWICE DAILY.   SYSTANE BALANCE OP Apply 2 drops to eye 3 (three) times daily as needed.   vitamin C 1000 MG tablet Take 1,000 mg by mouth daily.       Contact information for follow-up providers    Colon Branch, MD Follow up.   Specialty:  Internal Medicine Contact information: [address] [place] [address] [phone]        Adrian Prows, MD. Schedule an appointment as soon as possible for a visit in 2 week(s).   Specialty:  Cardiology Contact information: [place] [address] [phone]            Contact information for after-discharge care    Destination    [facility] Follow up.    Specialty:  Skilled Nursing Facility Contact information: [place] [address] [phone]                 Allergies  Allergen Reactions  . Adhesive [Tape] Other (See Comments)    Caused blisters  . Amoxicillin Other (See Comments)    Has patient had a PCN reaction causing immediate rash, facial/tongue/throat swelling, SOB or lightheadedness with hypotension: Yes Has patient had a PCN reaction causing severe rash involving mucus membranes or skin necrosis: No Has patient had a PCN reaction that required hospitalization No Has patient had a PCN reaction occurring within the last 10 years: Yes If all of the above answers are "NO", then may proceed with Cephalosporin use.   Caused thrush  . Ciprofloxacin Other (See Comments)    Caused thrush  . Ultram [Tramadol] Other (See Comments)    hypotension    Consultations:  Cardiology  Procedures/Studies: Dg Chest 2 View  Result Date: 08/26/2016 CLINICAL DATA:  Low grade  fever. EXAM: CHEST  2 VIEW COMPARISON:  Yesterday FINDINGS: Chronic cardiomegaly. Stable aortic and hilar contours. Hyperinflation with mild streaky density at the bases, subtly increased based on the lateral view. No edema, effusion, or pneumothorax. IMPRESSION: Mild atelectasis at the bases with superimposed infection not excluded. Borderline increased from yesterday. Cardiomegaly. COPD. Electronically Signed   By: Monte Fantasia M.D.   On: 08/26/2016 08:12   Dg Chest 2 View  Result Date: 08/25/2016 CLINICAL DATA:  Fever.  Abdominal pain. EXAM: CHEST  2 VIEW COMPARISON:  07/23/2016 FINDINGS: The cardiac silhouette remains mildly enlarged. Aortic atherosclerosis is noted. There is mild chronic interstitial coarsening. The lungs are hyperinflated on the lateral radiograph, although the patient has taken a shallower inspiration on the frontal projection. There is focal mild opacity in the lateral right lung base on the frontal projection,  and there is mild curvilinear opacity in the left lung base. No sizable pleural effusion or pneumothorax is identified. No acute osseous abnormality is seen. IMPRESSION: Mild bibasilar opacities, likely atelectasis though early infection is not excluded. Electronically Signed   By: Logan Bores M.D.   On: 08/25/2016 16:58   Mr Hip Left W Wo Contrast  Result Date: 08/25/2016 CLINICAL DATA:  Left leg pain, unable to walk EXAM: MRI OF THE LEFT HIP WITHOUT AND WITH CONTRAST TECHNIQUE: Multiplanar, multisequence MR imaging was performed both before and after administration of intravenous contrast. CONTRAST:  56m MULTIHANCE GADOBENATE DIMEGLUMINE 529 MG/ML IV SOLN COMPARISON:  None. FINDINGS: Bones: No hip fracture, dislocation or avascular necrosis. No periosteal reaction or bone destruction. No aggressive osseous lesion. Normal sacrum and sacroiliac joints. No SI joint widening or erosive changes. Degenerative disc disease with disc height loss at L3-4, L4-5 and L5-S1. Articular cartilage and labrum Articular cartilage: High-grade partial-thickness cartilage loss of the left femoral head and acetabulum. Partial-thickness cartilage loss of the right femoral head and acetabulum. Bilateral inferior femoral head marginal osteophytes. Labrum: Grossly intact, but evaluation is limited by lack of intraarticular fluid. Joint or bursal effusion Joint effusion: Small left hip joint effusion. No right hip joint effusion. No SI joint effusion. Bursae:  No bursa formation. Muscles and tendons Flexors: Normal. Extensors: Normal. Abductors: Normal. Adductors: Normal. Gluteals: Normal. Hamstrings: Mild tendinosis of the left hamstring origin. Small partial tear of the right hamstring origin. Other findings Miscellaneous: No pelvic free fluid. No fluid collection or hematoma. No inguinal lymphadenopathy. No inguinal hernia. Incidental note made of a left pelvic kidney. IMPRESSION: 1. No hip fracture, dislocation or avascular  necrosis. 2. Moderate osteoarthritis of bilateral hips, left greater than right. Small left hip joint effusion. 3. Mild tendinosis of the left hamstring origin. 4. Small partial tear of the right hamstring origin. 5. Degenerative disc disease with disc height loss at L3-4, L4-5 and L5-S1. Electronically Signed   By: HKathreen Devoid  On: 08/25/2016 15:13   Dg Fluoro Guided Needle Plc Aspiration/injection Loc  Result Date: 08/27/2016 CLINICAL DATA:  Left hip pain.  Rule out infection. EXAM: Left HIP ASPIRATION UNDER FLUOROSCOPY FLUOROSCOPY TIME:  Fluoroscopy Time:  0 minutes 24 seconds CT Radiation Exposure Index (if provided by the fluoroscopic device): Number of Acquired Spot Images: 0 PROCEDURE: Overlying skin prepped with Betadine, draped in the usual sterile fashion, and infiltrated locally with buffered Lidocaine. Curved 20 gauge spinal needle advanced to the superolateral margin of the left femoral head. 1 ml of Lidocaine injected easily. Diagnostic injection of iodinated contrast demonstrates intra-articular spread without intravascular component. No significant joint  fluid was aspirated. The patient was small joint effusion on left hip MRI 2 days ago. Contrast injected confirming intra-articular location of the needle. 2 mL of fluid were then aspirated from the joint. IMPRESSION: Technically successful left hip aspiration under fluoroscopy. Electronically Signed   By: Franchot Gallo M.D.   On: 08/27/2016 13:59   Dg Hip Unilat W Or Wo Pelvis 2-3 Views Left  Result Date: 08/25/2016 CLINICAL DATA:  Chronic left hip pain EXAM: DG HIP (WITH OR WITHOUT PELVIS) 2-3V LEFT COMPARISON:  None. FINDINGS: Moderate osteoarthritic changes in the hips bilaterally with joint space narrowing and spurring. SI joints are symmetric and unremarkable. No acute bony abnormality. Specifically, no fracture, subluxation, or dislocation. Soft tissues are intact. IMPRESSION: Moderate symmetric degenerative changes in the hips  bilaterally. No acute findings. Electronically Signed   By: Rolm Baptise M.D.   On: 08/25/2016 09:56   Subjective: Hip pain is much better, working with PT regularly still requiring walker when used to walk with cane. No fevers, chills,. Mild cough nonproductive.  Discharge Exam: BP 113/74 (BP Location: Right Arm)   Pulse (!) 120   Temp 98.5 F (36.9 C) (Oral)   Resp 17   Ht '5\' 10"'$  (1.778 m)   Wt 70.3 kg (155 lb)   SpO2 96%   BMI 22.24 kg/m   General: Pt is alert, awake, not in acute distress Cardiovascular: RRR, S1/S2 +, no rubs, no gallops Respiratory: CTA bilaterally, no wheezing, no rhonchi Abdominal: Soft, NT, ND, bowel sounds + Extremities: No deformity, no edema, no cyanosis Neuro: Bradykinetic, mild tremor. No focal deficits. Voice is hoarse.  Labs: Basic Metabolic Panel:  Recent Labs Lab 08/25/16 0942 08/26/16 0345  NA 140 138  K 3.3* 3.3*  CL 100* 103  CO2 30 27  GLUCOSE 101* 91  BUN 10 9  CREATININE 0.70 0.68  CALCIUM 9.1 8.7*  MG  --  1.8  PHOS  --  2.3*   Liver Function Tests:  Recent Labs Lab 08/26/16 0345  AST 21  ALT 16  ALKPHOS 68  BILITOT 1.4*  PROT 5.8*  ALBUMIN 3.2*   CBC:  Recent Labs Lab 08/25/16 0942 08/26/16 0345 08/27/16 0522 08/28/16 0313 08/29/16 0316  WBC 9.7 7.4 7.2 6.7 7.0  NEUTROABS 8.0*  --   --   --   --   HGB 12.4 11.5* 11.3* 10.8* 11.0*  HCT 38.5 34.7* 34.9* 33.2* 34.5*  MCV 92.5 90.8 90.9 91.2 92.5  PLT 176 152 142* 157 167   Urinalysis    Component Value Date/Time   COLORURINE YELLOW 08/25/2016 1136   APPEARANCEUR CLEAR 08/25/2016 1136   LABSPEC 1.015 08/25/2016 1136   PHURINE 7.5 08/25/2016 1136   GLUCOSEU NEGATIVE 08/25/2016 1136   GLUCOSEU NEGATIVE 03/31/2016 1137   HGBUR NEGATIVE 08/25/2016 1136   [place] 08/25/2016 1136   BILIRUBINUR neg 06/19/2016 0950   KETONESUR 15 (A) 08/25/2016 1136   PROTEINUR NEGATIVE 08/25/2016 1136   UROBILINOGEN negative 06/19/2016 0950   UROBILINOGEN  0.2 03/31/2016 1137   NITRITE NEGATIVE 08/25/2016 1136   LEUKOCYTESUR TRACE (A) 08/25/2016 1136   Microbiology Recent Results (from the past 240 hour(s))  Blood culture (routine x 2)     Status: None (Preliminary result)   Collection Time: 08/25/16  9:30 AM  Result Value Ref Range Status   Specimen Description BLOOD LEFT FOREARM  Final   Special Requests   Final    BOTTLES DRAWN AEROBIC AND ANAEROBIC Blood Culture adequate volume  Culture NO GROWTH 4 DAYS  Final   Report Status PENDING  Incomplete  Blood culture (routine x 2)     Status: None (Preliminary result)   Collection Time: 08/25/16  9:42 AM  Result Value Ref Range Status   Specimen Description BLOOD LEFT FOREARM  Final   Special Requests   Final    BOTTLES DRAWN AEROBIC ONLY Blood Culture adequate volume   Culture NO GROWTH 4 DAYS  Final   Report Status PENDING  Incomplete  Respiratory Panel by PCR     Status: None   Collection Time: 08/25/16  7:24 PM  Result Value Ref Range Status   Adenovirus NOT DETECTED NOT DETECTED Final   Coronavirus 229E NOT DETECTED NOT DETECTED Final   Coronavirus HKU1 NOT DETECTED NOT DETECTED Final   Coronavirus NL63 NOT DETECTED NOT DETECTED Final   Coronavirus OC43 NOT DETECTED NOT DETECTED Final   Metapneumovirus NOT DETECTED NOT DETECTED Final   Rhinovirus / Enterovirus NOT DETECTED NOT DETECTED Final   Influenza A NOT DETECTED NOT DETECTED Final   Influenza B NOT DETECTED NOT DETECTED Final   Parainfluenza Virus 1 NOT DETECTED NOT DETECTED Final   Parainfluenza Virus 2 NOT DETECTED NOT DETECTED Final   Parainfluenza Virus 3 NOT DETECTED NOT DETECTED Final   Parainfluenza Virus 4 NOT DETECTED NOT DETECTED Final   Respiratory Syncytial Virus NOT DETECTED NOT DETECTED Final   Bordetella pertussis NOT DETECTED NOT DETECTED Final   Chlamydophila pneumoniae NOT DETECTED NOT DETECTED Final   Mycoplasma pneumoniae NOT DETECTED NOT DETECTED Final  Body fluid culture     Status: None  (Preliminary result)   Collection Time: 08/27/16  1:42 PM  Result Value Ref Range Status   Specimen Description SYNOVIAL LEFT HIP  Final   Special Requests NONE  Final   Gram Stain   Final    RARE WBC PRESENT, PREDOMINANTLY PMN NO ORGANISMS SEEN    Culture NO GROWTH 2 DAYS  Final   Report Status PENDING  Incomplete    Time coordinating discharge: Approximately 40 minutes  Vance Gather, MD  [facility] 08/29/2016, 12:59 PM Pager [phone]

## 2016-08-29 NOTE — Progress Notes (Signed)
Patient in a stable condition, discharge education reviewed with patient, she verbalized understanding, iv removed, tele dc ccmd notified, patient belongings at bedside, awaiting transport by EMS to [facility]

## 2016-08-29 NOTE — Progress Notes (Signed)
Clinical Social Worker facilitated patient discharge including contacting patient family and facility to confirm patient discharge plans.  Clinical information faxed to facility and family agreeable with plan.  CSW arranged ambulance transport via [facility] to [name] .  RN to call [phone] (pt will go in rm#[id number], ask for RN who has [place])  report prior to discharge.  Clinical Social Worker will sign off for now as social work intervention is no longer needed. Please consult [place] again if new need arises.  Rhea Pink, MSW, [place]

## 2016-08-29 NOTE — Care Management Important Message (Signed)
Important Message  Patient Details  Name: [name] MRN: [id number] Date of Birth: Jan 27, 1928   Medicare Important Message Given:  Yes    [name] 08/29/2016, 1:11 PM

## 2016-08-29 NOTE — Telephone Encounter (Signed)
thx

## 2016-08-29 NOTE — Telephone Encounter (Signed)
Just an FYI

## 2016-08-29 NOTE — Telephone Encounter (Signed)
Pt's daughter Kennyth Lose called in to make PCP aware that pt is being discharged from the hospital today and will be going to the rehab facility [facility] on [address] in [place].

## 2016-08-30 LAB — CULTURE, BLOOD (ROUTINE X 2)
Culture: NO GROWTH
Culture: NO GROWTH
Special Requests: ADEQUATE
Special Requests: ADEQUATE

## 2016-08-30 LAB — BODY FLUID CULTURE: Culture: NO GROWTH

## 2016-09-01 NOTE — Telephone Encounter (Signed)
Pt's daughter would like a call back to discuss mom's (pt's) care further.    CB: [phone]

## 2016-09-01 NOTE — Telephone Encounter (Signed)
I believe she will have to M.D. Assigned to her while in rehabilitation, encourage the patient's daughter  to talk to them first

## 2016-09-02 ENCOUNTER — Encounter: Payer: Self-pay | Admitting: Internal Medicine

## 2016-09-02 ENCOUNTER — Non-Acute Institutional Stay (SKILLED_NURSING_FACILITY): Payer: Medicare Other | Admitting: Internal Medicine

## 2016-09-02 DIAGNOSIS — J189 Pneumonia, unspecified organism: Secondary | ICD-10-CM | POA: Diagnosis not present

## 2016-09-02 DIAGNOSIS — F329 Major depressive disorder, single episode, unspecified: Secondary | ICD-10-CM

## 2016-09-02 DIAGNOSIS — R2681 Unsteadiness on feet: Secondary | ICD-10-CM | POA: Diagnosis not present

## 2016-09-02 DIAGNOSIS — M25552 Pain in left hip: Secondary | ICD-10-CM

## 2016-09-02 DIAGNOSIS — F32A Depression, unspecified: Secondary | ICD-10-CM

## 2016-09-02 DIAGNOSIS — J42 Unspecified chronic bronchitis: Secondary | ICD-10-CM | POA: Diagnosis not present

## 2016-09-02 DIAGNOSIS — I951 Orthostatic hypotension: Secondary | ICD-10-CM | POA: Diagnosis not present

## 2016-09-02 DIAGNOSIS — I482 Chronic atrial fibrillation, unspecified: Secondary | ICD-10-CM

## 2016-09-02 DIAGNOSIS — K5900 Constipation, unspecified: Secondary | ICD-10-CM | POA: Diagnosis not present

## 2016-09-02 DIAGNOSIS — E876 Hypokalemia: Secondary | ICD-10-CM

## 2016-09-02 NOTE — Progress Notes (Signed)
LOCATION: [facility]  PCP: Colon Branch, MD   Code Status: Full Code  Goals of care: Advanced Directive information Advanced Directives 08/25/2016  Does Patient Have a Medical Advance Directive? Yes  Type of Advance Directive [place]  Does patient want to make changes to medical advance directive? No - Patient declined  Copy of [place] in Chart? No - copy requested  Would patient like information on creating a medical advance directive? -       Extended Emergency Contact Information Primary Emergency Contact: Leonard,Jackie Address: [place], [place] Phone: [phone] Relation: Daughter   Allergies  Allergen Reactions  . Adhesive [Tape] Other (See Comments)    Caused blisters  . Amoxicillin Other (See Comments)    Has patient had a PCN reaction causing immediate rash, facial/tongue/throat swelling, SOB or lightheadedness with hypotension: Yes Has patient had a PCN reaction causing severe rash involving mucus membranes or skin necrosis: No Has patient had a PCN reaction that required hospitalization No Has patient had a PCN reaction occurring within the last 10 years: Yes If all of the above answers are "NO", then may proceed with Cephalosporin use.   Caused thrush  . Ciprofloxacin Other (See Comments)    Caused thrush  . Ultram [Tramadol] Other (See Comments)    hypotension    Chief Complaint  Patient presents with  . New Admit To SNF    New Admission Visit      HPI:  Patient is a 83 y.o. female seen today for short term rehabilitation post hospital admission from 08/25/16-08/29/16 with left hip and leg pain along with fever. She was treated with antibiotic for CAP and underwent arthrocentesis of left hip that was negative for gram stain and culture. She was placed on pain medication. She has PMH of afib, parkinson's disease, recurrent UTI, arthritis. She is seen in her room  today.   Review of Systems:  Constitutional: Negative for fever, chills, diaphoresis. Energy level is slowly coming back. HENT: Negative for headache, congestion,sore throat, difficulty swallowing. Positive for occasional clear nasal discharge.  Eyes: Negative for eye pain, blurred vision, double vision and discharge. Wears glasses. Respiratory: Negative for shortness of breath and wheezing. Positive for occasional dry cough.  Cardiovascular: Negative for chest pain, palpitations, leg swelling.  Gastrointestinal: Negative for heartburn, nausea, vomiting, abdominal pain, loss of appetite, melena. Last bowel movement was this morning.  Genitourinary: Negative for dysuria and flank pain.  Musculoskeletal: Negative for back pain, fall in the facility. her left hip and leg pain is 2/10 and has improved compared to 8-9/10 before.  Skin: Negative for itching, rash.  Neurological: Positive for occasional dizziness. Psychiatric/Behavioral: Negative for depression   Past Medical History:  Diagnosis Date  . Anxiety   . Arthritis   . Atrial fibrillation ([place])   . Cardiac arrhythmia due to congenital heart disease   . Chronic constipation   . COPD (chronic obstructive pulmonary disease) ([place])   . Depression   . GERD (gastroesophageal reflux disease)   . Hemorrhoid   . Hyperlipidemia   . Idiopathic hypotension   . Lightheadedness 11/2015  . Mycotic toenails 10/27/2012  . Parkinson disease ([place])   . Recurrent UTI   . 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 Varicose veins    Past Surgical History:  Procedure Laterality Date  . APPENDECTOMY  83 years old  .  COLON RESECTION  2008  . vitriectomy  08-2015   Social History:   reports that she quit smoking about 36 years ago. Her smoking use included Cigarettes. She has a 60.00 pack-year smoking history. She has never used smokeless tobacco. She reports that she does not drink alcohol or use drugs.  Family History  Problem Relation Age of Onset   . Asthma Brother   . Alcohol abuse Brother   . Throat cancer Father   . Alcohol abuse Father   . Lupus Daughter   . Bipolar disorder Daughter   . Lupus Daughter   . Anxiety disorder Daughter   . Alcohol abuse Sister   . Alcohol abuse Maternal Grandfather   . Alcohol abuse Paternal Grandmother   . Breast cancer Sister   . Colon cancer Neg Hx     Medications: Allergies as of 09/02/2016      Reactions   Adhesive [tape] Other (See Comments)   Caused blisters   Amoxicillin Other (See Comments)   Has patient had a PCN reaction causing immediate rash, facial/tongue/throat swelling, SOB or lightheadedness with hypotension: Yes Has patient had a PCN reaction causing severe rash involving mucus membranes or skin necrosis: No Has patient had a PCN reaction that required hospitalization No Has patient had a PCN reaction occurring within the last 10 years: Yes If all of the above answers are "NO", then may proceed with Cephalosporin use. Caused thrush   Ciprofloxacin Other (See Comments)   Caused thrush   Ultram [tramadol] Other (See Comments)   hypotension      Medication List       Accurate as of 09/02/16  2:38 PM. Always use your most recent med list.          acetaminophen 325 MG tablet Commonly known as:  TYLENOL Take 2 tablets (650 mg total) by mouth every 6 (six) hours as needed for mild pain (or Fever >/= 101).   albuterol 108 (90 Base) MCG/ACT inhaler Commonly known as:  VENTOLIN HFA Inhale 2 puffs into the lungs 2 (two) times daily as needed for wheezing or shortness of breath.   apixaban 5 MG Tabs tablet Commonly known as:  ELIQUIS Take 5 mg by mouth 2 (two) times daily.   azelastine 0.1 % nasal spray Commonly known as:  ASTELIN Place 2 sprays into both nostrils at bedtime as needed for rhinitis. Use in each nostril as directed   cefpodoxime 200 MG tablet Commonly known as:  VANTIN Take 1 tablet (200 mg total) by mouth every 12 (twelve) hours.     cholecalciferol 1000 units tablet Commonly known as:  VITAMIN D Take 1,000 Units by mouth daily.   diltiazem 30 MG tablet Commonly known as:  CARDIZEM Take 1 tablet (30 mg total) by mouth 4 (four) times daily. Take at onset on atrial fibrillation   DULoxetine 30 MG capsule Commonly known as:  CYMBALTA Take 1 capsule (30 mg total) by mouth daily.   fludrocortisone 0.1 MG tablet Commonly known as:  FLORINEF Take 0.1 mg by mouth daily.   polyethylene glycol powder powder Commonly known as:  GLYCOLAX/MIRALAX Take 17 g by mouth 2 (two) times daily.   SYMBICORT 160-4.5 MCG/ACT inhaler Generic drug:  budesonide-formoterol INHALE 2 PUFFS BY MOUTH INTO THE LUNGS TWICE DAILY.   SYSTANE BALANCE OP Apply 2 drops to eye 3 (three) times daily as needed.   vitamin C 1000 MG tablet Take 1,000 mg by mouth daily.       Immunizations:  Immunization History  Administered Date(s) Administered  . Influenza Split 01/20/2011  . Influenza Whole 02/19/2012  . Influenza,inj,Quad PF,36+ Mos 06/03/2013, 02/02/2015  . Influenza-Unspecified 01/13/2014, 01/24/2016  . Pneumococcal Conjugate-13 03/07/2014  . Pneumococcal Polysaccharide-23 12/01/2012  . Td 12/01/2012  . Zoster 03/10/2015     Physical Exam: Vitals:   09/02/16 1433  BP: 127/77  Pulse: 83  Resp: 20  Temp: 97.6 F (36.4 C)  TempSrc: Oral  SpO2: 99%  Weight: 155 lb (70.3 kg)  Height: '5\' 10"'$  (1.778 m)   Body mass index is 22.24 kg/m.  General- elderly female, thin built, in no acute distress Head- normocephalic, atraumatic Nose- no maxillary or frontal sinus tenderness, no nasal discharge Throat- moist mucus membrane, normal oropharynx  Eyes- PERRLA, EOMI, no pallor, no icterus, no discharge, normal conjunctiva, normal sclera Neck- no cervical lymphadenopathy Cardiovascular- normal s1,s2, no murmur Respiratory- bilateral decreased air movement, audible wheezing, no rhonchi, no crackles, no use of accessory  muscles Abdomen- bowel sounds present, soft, non tender, no guarding or rigidity Musculoskeletal- able to move all 4 extremities, pain with raising of left leg and limited left hip range of motion, no leg edema, varicose veins present Neurological- alert and oriented to person, place and time Skin- warm and dry Psychiatry- normal mood and affect    Labs reviewed: Basic Metabolic Panel:  Recent Labs  05/28/16 1602 08/25/16 0942 08/26/16 0345  NA 137 140 138  K 3.4* 3.3* 3.3*  CL 101 100* 103  CO2 '30 30 27  '$ GLUCOSE 133* 101* 91  BUN '12 10 9  '$ CREATININE 0.74 0.70 0.68  CALCIUM 8.8* 9.1 8.7*  MG  --   --  1.8  PHOS  --   --  2.3*   Liver Function Tests:  Recent Labs  11/22/15 1320 05/28/16 1602 08/26/16 0345  AST '21 21 21  '$ ALT '15 14 16  '$ ALKPHOS 78 72 68  BILITOT 0.6 0.5 1.4*  PROT 6.5 6.3* 5.8*  ALBUMIN 4.0 3.7 3.2*   No results for input(s): LIPASE, AMYLASE in the last 8760 hours. No results for input(s): AMMONIA in the last 8760 hours. CBC:  Recent Labs  01/31/16 1143 05/28/16 1602 08/25/16 0942  08/27/16 0522 08/28/16 0313 08/29/16 0316  WBC 7.1 5.7 9.7  < > 7.2 6.7 7.0  NEUTROABS 4.7 3.7 8.0*  --   --   --   --   HGB 12.2 11.7* 12.4  < > 11.3* 10.8* 11.0*  HCT 36.6 36.3 38.5  < > 34.9* 33.2* 34.5*  MCV 92.6 90.5 92.5  < > 90.9 91.2 92.5  PLT 219.0 167 176  < > 142* 157 167  < > = values in this interval not displayed. Cardiac Enzymes:  Recent Labs  11/22/15 1320 11/24/15 1221  TROPONINI 0.04* <0.03   BNP: Invalid input(s): POCBNP CBG: No results for input(s): GLUCAP in the last 8760 hours.  Radiological Exams: Dg Chest 2 View  Result Date: 08/26/2016 CLINICAL DATA:  Low grade fever. EXAM: CHEST  2 VIEW COMPARISON:  Yesterday FINDINGS: Chronic cardiomegaly. Stable aortic and hilar contours. Hyperinflation with mild streaky density at the bases, subtly increased based on the lateral view. No edema, effusion, or pneumothorax. IMPRESSION: Mild  atelectasis at the bases with superimposed infection not excluded. Borderline increased from yesterday. Cardiomegaly. COPD. Electronically Signed   By: Monte Fantasia M.D.   On: 08/26/2016 08:12   Dg Chest 2 View  Result Date: 08/25/2016 CLINICAL DATA:  Fever.  Abdominal pain. EXAM:  CHEST  2 VIEW COMPARISON:  07/23/2016 FINDINGS: The cardiac silhouette remains mildly enlarged. Aortic atherosclerosis is noted. There is mild chronic interstitial coarsening. The lungs are hyperinflated on the lateral radiograph, although the patient has taken a shallower inspiration on the frontal projection. There is focal mild opacity in the lateral right lung base on the frontal projection, and there is mild curvilinear opacity in the left lung base. No sizable pleural effusion or pneumothorax is identified. No acute osseous abnormality is seen. IMPRESSION: Mild bibasilar opacities, likely atelectasis though early infection is not excluded. Electronically Signed   By: Logan Bores M.D.   On: 08/25/2016 16:58   Mr Hip Left W Wo Contrast  Result Date: 08/25/2016 CLINICAL DATA:  Left leg pain, unable to walk EXAM: MRI OF THE LEFT HIP WITHOUT AND WITH CONTRAST TECHNIQUE: Multiplanar, multisequence MR imaging was performed both before and after administration of intravenous contrast. CONTRAST:  38m MULTIHANCE GADOBENATE DIMEGLUMINE 529 MG/ML IV SOLN COMPARISON:  None. FINDINGS: Bones: No hip fracture, dislocation or avascular necrosis. No periosteal reaction or bone destruction. No aggressive osseous lesion. Normal sacrum and sacroiliac joints. No SI joint widening or erosive changes. Degenerative disc disease with disc height loss at L3-4, L4-5 and L5-S1. Articular cartilage and labrum Articular cartilage: High-grade partial-thickness cartilage loss of the left femoral head and acetabulum. Partial-thickness cartilage loss of the right femoral head and acetabulum. Bilateral inferior femoral head marginal osteophytes. Labrum:  Grossly intact, but evaluation is limited by lack of intraarticular fluid. Joint or bursal effusion Joint effusion: Small left hip joint effusion. No right hip joint effusion. No SI joint effusion. Bursae:  No bursa formation. Muscles and tendons Flexors: Normal. Extensors: Normal. Abductors: Normal. Adductors: Normal. Gluteals: Normal. Hamstrings: Mild tendinosis of the left hamstring origin. Small partial tear of the right hamstring origin. Other findings Miscellaneous: No pelvic free fluid. No fluid collection or hematoma. No inguinal lymphadenopathy. No inguinal hernia. Incidental note made of a left pelvic kidney. IMPRESSION: 1. No hip fracture, dislocation or avascular necrosis. 2. Moderate osteoarthritis of bilateral hips, left greater than right. Small left hip joint effusion. 3. Mild tendinosis of the left hamstring origin. 4. Small partial tear of the right hamstring origin. 5. Degenerative disc disease with disc height loss at L3-4, L4-5 and L5-S1. Electronically Signed   By: HKathreen Devoid  On: 08/25/2016 15:13   Dg Fluoro Guided Needle Plc Aspiration/injection Loc  Result Date: 08/27/2016 CLINICAL DATA:  Left hip pain.  Rule out infection. EXAM: Left HIP ASPIRATION UNDER FLUOROSCOPY FLUOROSCOPY TIME:  Fluoroscopy Time:  0 minutes 24 seconds CT Radiation Exposure Index (if provided by the fluoroscopic device): Number of Acquired Spot Images: 0 PROCEDURE: Overlying skin prepped with Betadine, draped in the usual sterile fashion, and infiltrated locally with buffered Lidocaine. Curved 20 gauge spinal needle advanced to the superolateral margin of the left femoral head. 1 ml of Lidocaine injected easily. Diagnostic injection of iodinated contrast demonstrates intra-articular spread without intravascular component. No significant joint fluid was aspirated. The patient was small joint effusion on left hip MRI 2 days ago. Contrast injected confirming intra-articular location of the needle. 2 mL of fluid were  then aspirated from the joint. IMPRESSION: Technically successful left hip aspiration under fluoroscopy. Electronically Signed   By: CFranchot GalloM.D.   On: 08/27/2016 13:59   Dg Hip Unilat W Or Wo Pelvis 2-3 Views Left  Result Date: 08/25/2016 CLINICAL DATA:  Chronic left hip pain EXAM: DG HIP (WITH OR WITHOUT PELVIS)  2-3V LEFT COMPARISON:  None. FINDINGS: Moderate osteoarthritic changes in the hips bilaterally with joint space narrowing and spurring. SI joints are symmetric and unremarkable. No acute bony abnormality. Specifically, no fracture, subluxation, or dislocation. Soft tissues are intact. IMPRESSION: Moderate symmetric degenerative changes in the hips bilaterally. No acute findings. Electronically Signed   By: Rolm Baptise M.D.   On: 08/25/2016 09:56    Assessment/Plan  unsteady gait With left hip and leg pain. Will have her work with physical therapy and occupational therapy team to help with gait training and muscle strengthening exercises.fall precautions. Skin care. Encourage to be out of bed.   Left hip pain Infection ruled out. Continue tylenol as needed for pain .Will have patient work with PT/OT as tolerated to regain strength and restore function.  Fall precautions are in place. PMR consult.  CAP Monitor her breathing. Continue and complete cefpodoxime q12h until 09/08/16. Provide incentive spirometer and encourage use to prevent atelectasis.   COPD Currently on ventolin bid prn, symbicort bid, has audible wheezing on exam. Start duoneb qid x 5 days and then prn. Monitor for signs of copd exacerbation  Hypokalemia Check bmp  afib Controlled HR, continue diltiazaem 30 mg qid for rate control and eliquis 5 mg bid for anticoagulation for stroke prevention. f/u with cardiology  Constipation Continue miralax bid  Chronic depression Continue her cymbalta and monitor  orthostasis Continue florinef, no changes made   Goals of care: short term  rehabilitation   Labs/tests ordered: cbc, cmp 09/05/16  Family/ staff Communication: reviewed care plan with patient and nursing supervisor    Blanchie Serve, MD Internal Medicine [place], [place] [address] Cell Phone (Monday-Friday 8 am - 5 pm): [phone] On Call: [phone] and follow prompts after 5 pm and on weekends Office Phone: [phone] Office Fax: [phone]

## 2016-09-02 NOTE — Telephone Encounter (Signed)
Patient's daughter called to follow up. Note from Dr. Larose Kells read to daughter via phone. Daughter states she understood and will speak with MD assigned to patient at rehab.

## 2016-09-05 DIAGNOSIS — M79605 Pain in left leg: Secondary | ICD-10-CM | POA: Diagnosis not present

## 2016-09-05 DIAGNOSIS — J449 Chronic obstructive pulmonary disease, unspecified: Secondary | ICD-10-CM | POA: Diagnosis not present

## 2016-09-05 DIAGNOSIS — M25552 Pain in left hip: Secondary | ICD-10-CM | POA: Diagnosis not present

## 2016-09-05 DIAGNOSIS — I951 Orthostatic hypotension: Secondary | ICD-10-CM | POA: Diagnosis not present

## 2016-09-05 DIAGNOSIS — R2681 Unsteadiness on feet: Secondary | ICD-10-CM | POA: Diagnosis not present

## 2016-09-05 DIAGNOSIS — I4891 Unspecified atrial fibrillation: Secondary | ICD-10-CM | POA: Diagnosis not present

## 2016-09-05 LAB — HEPATIC FUNCTION PANEL
ALT: 23 U/L (ref 7–35)
AST: 22 U/L (ref 13–35)
Alkaline Phosphatase: 98 U/L (ref 25–125)
Bilirubin, Total: 0.4 mg/dL

## 2016-09-05 LAB — CBC AND DIFFERENTIAL
HCT: 35 % — AB (ref 36–46)
HEMOGLOBIN: 11.3 g/dL — AB (ref 12.0–16.0)
Platelets: 216 10*3/uL (ref 150–399)
WBC: 5.9 10*3/mL

## 2016-09-05 LAB — BASIC METABOLIC PANEL
BUN: 13 mg/dL (ref 4–21)
Creatinine: 0.7 mg/dL (ref 0.5–1.1)
GLUCOSE: 68 mg/dL
POTASSIUM: 3.8 mmol/L (ref 3.4–5.3)
Sodium: 147 mmol/L (ref 137–147)

## 2016-09-08 NOTE — Progress Notes (Signed)
09/05/16 

## 2016-09-09 ENCOUNTER — Non-Acute Institutional Stay (SKILLED_NURSING_FACILITY): Payer: Medicare Other | Admitting: Adult Health

## 2016-09-09 ENCOUNTER — Encounter: Payer: Self-pay | Admitting: Adult Health

## 2016-09-09 DIAGNOSIS — R2681 Unsteadiness on feet: Secondary | ICD-10-CM

## 2016-09-09 DIAGNOSIS — J189 Pneumonia, unspecified organism: Secondary | ICD-10-CM | POA: Diagnosis not present

## 2016-09-09 DIAGNOSIS — F329 Major depressive disorder, single episode, unspecified: Secondary | ICD-10-CM | POA: Diagnosis not present

## 2016-09-09 DIAGNOSIS — M25552 Pain in left hip: Secondary | ICD-10-CM | POA: Diagnosis not present

## 2016-09-09 DIAGNOSIS — M79605 Pain in left leg: Secondary | ICD-10-CM | POA: Diagnosis not present

## 2016-09-09 DIAGNOSIS — F32A Depression, unspecified: Secondary | ICD-10-CM

## 2016-09-09 DIAGNOSIS — I482 Chronic atrial fibrillation, unspecified: Secondary | ICD-10-CM

## 2016-09-09 DIAGNOSIS — J42 Unspecified chronic bronchitis: Secondary | ICD-10-CM

## 2016-09-09 DIAGNOSIS — K5900 Constipation, unspecified: Secondary | ICD-10-CM | POA: Diagnosis not present

## 2016-09-09 DIAGNOSIS — I951 Orthostatic hypotension: Secondary | ICD-10-CM

## 2016-09-09 DIAGNOSIS — J449 Chronic obstructive pulmonary disease, unspecified: Secondary | ICD-10-CM | POA: Diagnosis not present

## 2016-09-09 DIAGNOSIS — I4891 Unspecified atrial fibrillation: Secondary | ICD-10-CM | POA: Diagnosis not present

## 2016-09-09 NOTE — Progress Notes (Signed)
DATE:  09/09/2016   MRN:  [id number]  BIRTHDAY: August 15, 1927  Facility:  Nursing Home Location:  [place] Room Number: 1205-P  LEVEL OF CARE:  SNF (31)  Contact Information    Name Relation Home Work Mobile   [place] Daughter [phone]         Code Status History    Date Active Date Inactive Code Status Order ID Comments User Context   08/25/2016 11:56 PM 08/29/2016  6:00 PM Full Code [id number]  Toy Baker, MD Inpatient   11/22/2015  6:36 PM 11/23/2015  1:11 AM Full Code [id number]  Reyne Dumas, MD Inpatient   01/31/2015  6:05 PM 02/02/2015  5:52 PM Full Code [id number]  Willia Craze, NP Inpatient       Chief Complaint  Patient presents with  . Discharge Note    HISTORY OF PRESENT ILLNESS:  This is an 89-YO female seen for discharge.  She will discharge to home on 09/10/2016.  She will be followed by home health for PT, OT, CNA, and social work services.  She has been admitted to [facility] on [address] from [facility] admission dates 08/25/16 thru 08/29/16 with left hip and leg pain along with fever. She was given antibiotics for CAP and had arthrocentesis of left hip that was negative for gram stain and culture. She was given pain medications. She has PMH of atrial fibrillation, parkinson's disease, recurrent UTI and arthritis.  Patient was admitted to this facility for short-term rehabilitation after the patient's recent hospitalization.  Patient has completed SNF rehabilitation and therapy has cleared the patient for discharge.   PAST MEDICAL HISTORY:  Past Medical History:  Diagnosis Date  . Anxiety   . Arthritis   . Atrial fibrillation ([place])   . Cardiac arrhythmia due to congenital heart disease   . Chronic constipation   . COPD (chronic obstructive pulmonary disease) ([place])   . Depression   . GERD (gastroesophageal reflux disease)   . Hemorrhoid   . Hyperlipidemia   . Idiopathic  hypotension   . Lightheadedness 11/2015  . Mycotic toenails 10/27/2012  . Parkinson disease ([place])   . Recurrent UTI   . 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 Varicose veins      CURRENT MEDICATIONS: Reviewed  Patient's Medications  New Prescriptions   No medications on file  Previous Medications   ACETAMINOPHEN (TYLENOL) 325 MG TABLET    Take 2 tablets (650 mg total) by mouth every 6 (six) hours as needed for mild pain (or Fever >/= 101).   ALBUTEROL (VENTOLIN HFA) 108 (90 BASE) MCG/ACT INHALER    Inhale 2 puffs into the lungs 2 (two) times daily as needed for wheezing or shortness of breath.   APIXABAN (ELIQUIS) 5 MG TABS TABLET    Take 5 mg by mouth 2 (two) times daily.   ASCORBIC ACID (VITAMIN C) 1000 MG TABLET    Take 1,000 mg by mouth daily.   AZELASTINE (ASTELIN) 0.1 % NASAL SPRAY    Place 2 sprays into both nostrils at bedtime as needed for rhinitis. Use in each nostril as directed   CHOLECALCIFEROL (VITAMIN D) 1000 UNITS TABLET    Take 1,000 Units by mouth daily.   DILTIAZEM (CARDIZEM) 30 MG TABLET    Take 1 tablet (30 mg total) by mouth 4 (four) times daily. Take at onset on atrial fibrillation   DULOXETINE (CYMBALTA) 30 MG CAPSULE    Take 1  capsule (30 mg total) by mouth daily.   FLUDROCORTISONE (FLORINEF) 0.1 MG TABLET    Take 0.1 mg by mouth daily.   IPRATROPIUM-ALBUTEROL (DUONEB) 0.5-2.5 (3) MG/3ML SOLN    Take 3 mLs by nebulization every 6 (six) hours as needed (Dyspnea/wheezing).   POLYETHYLENE GLYCOL POWDER (GLYCOLAX/MIRALAX) POWDER    Take 17 g by mouth 2 (two) times daily.   PROPYLENE GLYCOL (SYSTANE BALANCE OP)    Apply 2 drops to eye 2 (two) times daily.    SYMBICORT 160-4.5 MCG/ACT INHALER    INHALE 2 PUFFS BY MOUTH INTO THE LUNGS TWICE DAILY.  Modified Medications   No medications on file  Discontinued Medications   No medications on file     Allergies  Allergen Reactions  . Adhesive [Tape] Other (See Comments)    Caused blisters  . Amoxicillin Other  (See Comments)    Has patient had a PCN reaction causing immediate rash, facial/tongue/throat swelling, SOB or lightheadedness with hypotension: Yes Has patient had a PCN reaction causing severe rash involving mucus membranes or skin necrosis: No Has patient had a PCN reaction that required hospitalization No Has patient had a PCN reaction occurring within the last 10 years: Yes If all of the above answers are "NO", then may proceed with Cephalosporin use.   Caused thrush  . Ciprofloxacin Other (See Comments)    Caused thrush  . Ultram [Tramadol] Other (See Comments)    hypotension     REVIEW OF SYSTEMS:  GENERAL: no change in appetite, no fatigue, no weight changes, no fever, chills or weakness EYES: Denies change in vision, dry eyes, eye pain, itching or discharge EARS: Denies change in hearing, ringing in ears, or earache NOSE: Denies nasal congestion or epistaxis MOUTH and THROAT: Denies oral discomfort, gingival pain or bleeding, pain from teeth or hoarseness   RESPIRATORY: no cough, SOB, DOE, wheezing, hemoptysis CARDIAC: no chest pain, edema or palpitations GI: no abdominal pain, diarrhea, constipation, heart burn, nausea or vomiting GU: Denies dysuria, frequency, hematuria, incontinence, or discharge PSYCHIATRIC: Denies feeling of depression or anxiety. No report of hallucinations, insomnia, paranoia, or agitation     PHYSICAL EXAMINATION  GENERAL APPEARANCE: Well nourished. In no acute distress. Normal body habitus SKIN:  Skin is warm and dry.  HEAD: Normal in size and contour. No evidence of trauma EYES: Lids open and close normally. No blepharitis, entropion or ectropion. PERRL. Conjunctivae are clear and sclerae are white. Lenses are without opacity EARS: Pinnae are normal. Patient hears normal voice tunes of the examiner MOUTH and THROAT: Lips are without lesions. Oral mucosa is moist and without lesions. Tongue is normal in shape, size, and color and without  lesions NECK: supple, trachea midline, no neck masses, no thyroid tenderness, no thyromegaly LYMPHATICS: no LAN in the neck, no supraclavicular LAN RESPIRATORY: breathing is even & unlabored, BS CTAB CARDIAC: Irregularly irregular heart rate, no murmur,no extra heart sounds, no edema GI: abdomen soft, normal BS, no masses, no tenderness, no hepatomegaly, no splenomegaly EXTREMITIES:  Able to move X 4 extremities, walks with walker PSYCHIATRIC: Alert and oriented X 3. Affect and behavior are appropriate    LABS/RADIOLOGY: Labs reviewed:  09/09/16   sodium 145 K3.5 to close 80 BUN 15 creatinine 0.8 calcium 9.1 GFR >17 Basic Metabolic Panel:  Recent Labs  05/28/16 1602 08/25/16 0942 08/26/16 0345 09/05/16  NA 137 140 138 147  K 3.4* 3.3* 3.3* 3.8  CL 101 100* 103  --   CO2 '30 30 27  '$ --  GLUCOSE 133* 101* 91  --   BUN '12 10 9 13  '$ CREATININE 0.74 0.70 0.68 0.7  CALCIUM 8.8* 9.1 8.7*  --   MG  --   --  1.8  --   PHOS  --   --  2.3*  --    Liver Function Tests:  Recent Labs  11/22/15 1320 05/28/16 1602 08/26/16 0345 09/05/16  AST '21 21 21 22  '$ ALT '15 14 16 23  '$ ALKPHOS 78 72 68 98  BILITOT 0.6 0.5 1.4*  --   PROT 6.5 6.3* 5.8*  --   ALBUMIN 4.0 3.7 3.2*  --    CBC:  Recent Labs  01/31/16 1143 05/28/16 1602 08/25/16 0942  08/27/16 0522 08/28/16 0313 08/29/16 0316 09/05/16  WBC 7.1 5.7 9.7  < > 7.2 6.7 7.0 5.9  NEUTROABS 4.7 3.7 8.0*  --   --   --   --   --   HGB 12.2 11.7* 12.4  < > 11.3* 10.8* 11.0* 11.3*  HCT 36.6 36.3 38.5  < > 34.9* 33.2* 34.5* 35*  MCV 92.6 90.5 92.5  < > 90.9 91.2 92.5  --   PLT 219.0 167 176  < > 142* 157 167 216  < > = values in this interval not displayed. Lipid Panel:  Recent Labs  10/03/15 1617  HDL 62.00   Cardiac Enzymes:  Recent Labs  11/22/15 1320 11/24/15 1221  TROPONINI 0.04* <0.03    Dg Chest 2 View  Result Date: 08/26/2016 CLINICAL DATA:  Low grade fever. EXAM: CHEST  2 VIEW COMPARISON:  Yesterday FINDINGS:  Chronic cardiomegaly. Stable aortic and hilar contours. Hyperinflation with mild streaky density at the bases, subtly increased based on the lateral view. No edema, effusion, or pneumothorax. IMPRESSION: Mild atelectasis at the bases with superimposed infection not excluded. Borderline increased from yesterday. Cardiomegaly. COPD. Electronically Signed   By: Monte Fantasia M.D.   On: 08/26/2016 08:12   Dg Chest 2 View  Result Date: 08/25/2016 CLINICAL DATA:  Fever.  Abdominal pain. EXAM: CHEST  2 VIEW COMPARISON:  07/23/2016 FINDINGS: The cardiac silhouette remains mildly enlarged. Aortic atherosclerosis is noted. There is mild chronic interstitial coarsening. The lungs are hyperinflated on the lateral radiograph, although the patient has taken a shallower inspiration on the frontal projection. There is focal mild opacity in the lateral right lung base on the frontal projection, and there is mild curvilinear opacity in the left lung base. No sizable pleural effusion or pneumothorax is identified. No acute osseous abnormality is seen. IMPRESSION: Mild bibasilar opacities, likely atelectasis though early infection is not excluded. Electronically Signed   By: Logan Bores M.D.   On: 08/25/2016 16:58   Mr Hip Left W Wo Contrast  Result Date: 08/25/2016 CLINICAL DATA:  Left leg pain, unable to walk EXAM: MRI OF THE LEFT HIP WITHOUT AND WITH CONTRAST TECHNIQUE: Multiplanar, multisequence MR imaging was performed both before and after administration of intravenous contrast. CONTRAST:  62m MULTIHANCE GADOBENATE DIMEGLUMINE 529 MG/ML IV SOLN COMPARISON:  None. FINDINGS: Bones: No hip fracture, dislocation or avascular necrosis. No periosteal reaction or bone destruction. No aggressive osseous lesion. Normal sacrum and sacroiliac joints. No SI joint widening or erosive changes. Degenerative disc disease with disc height loss at L3-4, L4-5 and L5-S1. Articular cartilage and labrum Articular cartilage: High-grade  partial-thickness cartilage loss of the left femoral head and acetabulum. Partial-thickness cartilage loss of the right femoral head and acetabulum. Bilateral inferior femoral head marginal osteophytes. Labrum: Grossly  intact, but evaluation is limited by lack of intraarticular fluid. Joint or bursal effusion Joint effusion: Small left hip joint effusion. No right hip joint effusion. No SI joint effusion. Bursae:  No bursa formation. Muscles and tendons Flexors: Normal. Extensors: Normal. Abductors: Normal. Adductors: Normal. Gluteals: Normal. Hamstrings: Mild tendinosis of the left hamstring origin. Small partial tear of the right hamstring origin. Other findings Miscellaneous: No pelvic free fluid. No fluid collection or hematoma. No inguinal lymphadenopathy. No inguinal hernia. Incidental note made of a left pelvic kidney. IMPRESSION: 1. No hip fracture, dislocation or avascular necrosis. 2. Moderate osteoarthritis of bilateral hips, left greater than right. Small left hip joint effusion. 3. Mild tendinosis of the left hamstring origin. 4. Small partial tear of the right hamstring origin. 5. Degenerative disc disease with disc height loss at L3-4, L4-5 and L5-S1. Electronically Signed   By: Kathreen Devoid   On: 08/25/2016 15:13   Dg Fluoro Guided Needle Plc Aspiration/injection Loc  Result Date: 08/27/2016 CLINICAL DATA:  Left hip pain.  Rule out infection. EXAM: Left HIP ASPIRATION UNDER FLUOROSCOPY FLUOROSCOPY TIME:  Fluoroscopy Time:  0 minutes 24 seconds CT Radiation Exposure Index (if provided by the fluoroscopic device): Number of Acquired Spot Images: 0 PROCEDURE: Overlying skin prepped with Betadine, draped in the usual sterile fashion, and infiltrated locally with buffered Lidocaine. Curved 20 gauge spinal needle advanced to the superolateral margin of the left femoral head. 1 ml of Lidocaine injected easily. Diagnostic injection of iodinated contrast demonstrates intra-articular spread without  intravascular component. No significant joint fluid was aspirated. The patient was small joint effusion on left hip MRI 2 days ago. Contrast injected confirming intra-articular location of the needle. 2 mL of fluid were then aspirated from the joint. IMPRESSION: Technically successful left hip aspiration under fluoroscopy. Electronically Signed   By: Franchot Gallo M.D.   On: 08/27/2016 13:59   Dg Hip Unilat W Or Wo Pelvis 2-3 Views Left  Result Date: 08/25/2016 CLINICAL DATA:  Chronic left hip pain EXAM: DG HIP (WITH OR WITHOUT PELVIS) 2-3V LEFT COMPARISON:  None. FINDINGS: Moderate osteoarthritic changes in the hips bilaterally with joint space narrowing and spurring. SI joints are symmetric and unremarkable. No acute bony abnormality. Specifically, no fracture, subluxation, or dislocation. Soft tissues are intact. IMPRESSION: Moderate symmetric degenerative changes in the hips bilaterally. No acute findings. Electronically Signed   By: Rolm Baptise M.D.   On: 08/25/2016 09:56    ASSESSMENT/PLAN:  Unsteady gait - for home health PT and OT for therapeutic strengthening exercises; fall precautions   Left hip pain - well-controlled; continue Tylenol 325 mg 2 tabs by mouth every 6 hours when necessary for pain  CAP - completed antibiotics; resolved  Atrial fibrillation - rate controlled; continue Eliquis 5 mg 1 tab by mouth twice a day, diltiazem 30 mg 1 tab by mouth 4 times a day  COPD - no SOB nor wheezing; continue Ventolin HFA 90 g inhaler inhale 2 puffs into the lungs twice a day when necessary, Symbicort 160-4.5 mcg inhaler inhale 2 puffs into the lungs twice a day, ipratropium albuterol 0.5-3 (2.5) 3 mL/3 mL 1 Neb 6 hours hours when necessary and check BMP in 1 week  Constipation - continue MiraLAX 17 g by mouth twice a day  Major depression - continue Cymbalta 30 mg 1 capsule by mouth daily  Orthostatic hypotension - fludrocortisone 0.1 mg 1 tab by mouth daily to hold for SBP  >110     I have filled  out patient's discharge paperwork and written prescriptions.  Patient will receive home health PT, OT, ST, SW and CNA.  DME provided:  None  Total discharge time: Greater than 30 minutes Greater than 50% was spent in counseling and coordination of care.   Discharge time involved coordination of the discharge process with social worker, nursing staff and therapy department. Medical justification for home health services verified.   Chavy Avera C. Ross - NP    [employer] [phone]

## 2016-09-17 ENCOUNTER — Ambulatory Visit: Payer: Medicare Other | Admitting: Podiatry

## 2016-09-18 ENCOUNTER — Ambulatory Visit (INDEPENDENT_AMBULATORY_CARE_PROVIDER_SITE_OTHER): Payer: Medicare Other | Admitting: Internal Medicine

## 2016-09-18 ENCOUNTER — Encounter: Payer: Self-pay | Admitting: Internal Medicine

## 2016-09-18 DIAGNOSIS — I4891 Unspecified atrial fibrillation: Secondary | ICD-10-CM | POA: Diagnosis not present

## 2016-09-18 DIAGNOSIS — M25559 Pain in unspecified hip: Secondary | ICD-10-CM | POA: Diagnosis not present

## 2016-09-18 LAB — PHOSPHORUS: PHOSPHORUS: 3.8 mg/dL (ref 2.3–4.6)

## 2016-09-18 NOTE — Progress Notes (Signed)
Pre visit review using our clinic review tool, if applicable. No additional management support is needed unless otherwise documented below in the visit note. 

## 2016-09-18 NOTE — Patient Instructions (Signed)
GO TO THE LAB : Get the blood work     GO TO THE FRONT DESK Schedule your next appointment for a  routine checkup in 3 months   Check the  blood pressure 2 or 3 times a   Week  Be sure your blood pressure is between 110/65 and  145/85. If it is consistently higher or lower, let me know

## 2016-09-18 NOTE — Progress Notes (Signed)
Subjective:    Patient ID: [name], female    DOB: 11/30/1927, 83 y.o.   MRN: [id number]  DOS:  09/18/2016 Type of visit - description : f/u Interval history: Was admitted to the hospital earlier this month and discharge 08/29/2016. She was admitted with left hip, acute on chronic, she also had a fever, they tapped the hip ,cultures were negative. Pain was felt to be possibly neuropathy, she worked with PT at the hospital and eventually discharged to SNF. She was also found to have pneumonia: the chest x-ray show possibly atelectasis but because of fever she was treated with antibiotics.  Labs were repeated 09/05/2016: BMP satisfactory, hemoglobin is stable at 11.3. Phosphorus needs to be repeated  Review of Systems she is back home for the last few days, feeling well. Hip pain is gone Denies any cough. No chest pain, DOE at baseline. No fever or chills  Past Medical History:  Diagnosis Date  . Anxiety   . Arthritis   . Atrial fibrillation ([place])   . Cardiac arrhythmia due to congenital heart disease   . Chronic constipation   . COPD (chronic obstructive pulmonary disease) ([place])   . Depression   . GERD (gastroesophageal reflux disease)   . Hemorrhoid   . Hyperlipidemia   . Idiopathic hypotension   . Lightheadedness 11/2015  . Mycotic toenails 10/27/2012  . Parkinson disease ([place])   . Recurrent UTI   . 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 Varicose veins     Past Surgical History:  Procedure Laterality Date  . APPENDECTOMY  83 years old  . COLON RESECTION  2008  . vitriectomy  08-2015    Social History   Social History  . Marital status: Widowed    Spouse name: N/A  . Number of children: 2  . Years of education: N/A   Occupational History  . retired- Ambulance person , elementary school [employer]  . Smoking status: Former Smoker    Packs/day: 2.00    Years: 30.00    Types: Cigarettes    Quit date: 04/21/1980  .  Smokeless tobacco: Never Used     Comment: onset age 87 -67, up to > 1ppd (almost 2 ppd)  . Alcohol use No  . Drug use: No  . Sexual activity: No   Other Topics Concern  . Not on file   Social History Narrative   Widowed, lives alone. 1 child in [place] and 1 in [place]. 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   Still drives as of 6-38-75      Allergies as of 09/18/2016      Reactions   Adhesive [tape] Other (See Comments)   Caused blisters   Amoxicillin Other (See Comments)   Has patient had a PCN reaction causing immediate rash, facial/tongue/throat swelling, SOB or lightheadedness with hypotension: Yes Has patient had a PCN reaction causing severe rash involving mucus membranes or skin necrosis: No Has patient had a PCN reaction that required hospitalization No Has patient had a PCN reaction occurring within the last 10 years: Yes If all of the above answers are "NO", then may proceed with Cephalosporin use. Caused thrush   Ciprofloxacin Other (See Comments)   Caused thrush   Ultram [tramadol] Other (See Comments)   hypotension      Medication List       Accurate as of 09/18/16 11:59 PM. Always use your most recent med  list.          acetaminophen 325 MG tablet Commonly known as:  TYLENOL Take 2 tablets (650 mg total) by mouth every 6 (six) hours as needed for mild pain (or Fever >/= 101).   albuterol 108 (90 Base) MCG/ACT inhaler Commonly known as:  VENTOLIN HFA Inhale 2 puffs into the lungs 2 (two) times daily as needed for wheezing or shortness of breath.   apixaban 5 MG Tabs tablet Commonly known as:  ELIQUIS Take 5 mg by mouth 2 (two) times daily.   azelastine 0.1 % nasal spray Commonly known as:  ASTELIN Place 2 sprays into both nostrils at bedtime as needed for rhinitis. Use in each nostril as directed   cholecalciferol 1000 units tablet Commonly known as:  VITAMIN D Take 1,000 Units by mouth daily.   diltiazem 30 MG tablet Commonly known as:   CARDIZEM Take 1 tablet (30 mg total) by mouth 4 (four) times daily. Take at onset on atrial fibrillation   DULoxetine 30 MG capsule Commonly known as:  CYMBALTA Take 1 capsule (30 mg total) by mouth daily.   ipratropium-albuterol 0.5-2.5 (3) MG/3ML Soln Commonly known as:  DUONEB Take 3 mLs by nebulization every 6 (six) hours as needed (Dyspnea/wheezing).   polyethylene glycol powder powder Commonly known as:  GLYCOLAX/MIRALAX Take 17 g by mouth 2 (two) times daily.   SYMBICORT 160-4.5 MCG/ACT inhaler Generic drug:  budesonide-formoterol INHALE 2 PUFFS BY MOUTH INTO THE LUNGS TWICE DAILY.   SYSTANE BALANCE OP Apply 2 drops to eye 2 (two) times daily.   vitamin C 1000 MG tablet Take 1,000 mg by mouth daily.          Objective:   Physical Exam BP 120/72 (BP Location: Left Arm, Patient Position: Sitting, Cuff Size: Normal)   Pulse 81   Temp 98.3 F (36.8 C) (Oral)   Ht 5\' 10"  (1.778 m)   Wt 148 lb 4 oz (67.2 kg)   BMI 21.27 kg/m  General:   Well developed, well nourished . NAD.  HEENT:  Normocephalic . Face symmetric, atraumatic Lungs:  CTA B Normal respiratory effort, no intercostal retractions, no accessory muscle use. Heart: Irregularly irregular,  no murmur.  No pretibial edema bilaterally  Skin: Not pale. Not jaundice Neurologic:  alert & oriented X3.  Speech normal, gait assisted by a cane and seems appropriate Psych--  Cognition and judgment appear intact.  Cooperative with normal attention span and concentration.  Behavior appropriate. No anxious or depressed appearing.      Assessment & Plan:   Assessment   A1c 5.7 01-2015 Hyperlipidemia Anxiety depression  Dr Ferdinand Lango (psychology), meds per pcp (see OV note from 02-19-16) Sx onset ~ 2007:  2012, 2013 was on citalopram. Then sertraline. Unable to tolerate Prozac on Wellbutrin 02-2015 (tired, dizzy). Trial with Cymbalta 01-2016. COPD   CV: P-Atrial fibrillation Dr Gerrit Friends, flecanaide prn  /cardiazem prn. Echo 11-2015 @ [facility] Low BP- likely d/t parkinson, rx fludrocortisone ~ 11-2015 , d/c 08-2016 GI: --GERD --Abnormal abdominal US mild pancreatic duct enlargement  --IBS used to be constipated, now diarrhea predominant --dysphagia MBE 02-22-15 wnl DJD--- take Celebrex very seldom Recurrent UTIs -- qd abx prn  , has seen urology Dr Edwena Blow before NEURO: ---Parkinsonism ---  Dr Tat ---Gait d/o (multifactorial likely) ---Peripheral neuropathy (pr neuro note 04-2015) ---mild cognitive impairmentairment (see neuro note 04-2015), dx confirmed by neuropsychiatry evaluation 07-2015 ---MBE 02/22/2015 normal admitted  01-2015: COPD and RVR  PLAN   hip pain:  Resolved, she is back home, living independently, has help twice a week. Hypophosphatemia : Recheck phosphorus. Pneumonia: Question of pneumonia on a chest x-ray, S/P antibiotics, now asx. Don't see a need to repeat a chest x-ray. Atrial fibrillation: Rate controlled, continue Cardizem and eliquis. H/o low  blood pressure: During her nursing home stay, patient reports BP was elevated, Florinef was discontinued. BP today is great, we agreed to continue with Cardizem for atrial fibrillation on monitor BPs. At some point may need to go back on Florinef if blood pressure gets low. Driving?   states she is able to drive a car, very short distances, without problems, I told her that without a road test is hard to judge her driving ability  RTC 3 months  Today, I spent more than 28   min with the patient: >50% of the time counseling regards to her multiple issues, listening to all her concerns carefully. She liked me to okay her decision to drive but I can't without a road test.

## 2016-09-19 ENCOUNTER — Encounter: Payer: Self-pay | Admitting: Internal Medicine

## 2016-09-19 NOTE — Assessment & Plan Note (Signed)
hip pain: Resolved, she is back home, living independently, has help twice a week. Hypophosphatemia : Recheck phosphorus. Pneumonia: Question of pneumonia on a chest x-ray, S/P antibiotics, now asx. Don't see a need to repeat a chest x-ray. Atrial fibrillation: Rate controlled, continue Cardizem and eliquis. H/o low  blood pressure: During her nursing home stay, patient reports BP was elevated, Florinef was discontinued. BP today is great, we agreed to continue with Cardizem for atrial fibrillation on monitor BPs. At some point may need to go back on Florinef if blood pressure gets low. Driving?   states she is able to drive a car, very short distances, without problems, I told her that without a road test is hard to judge her driving ability  RTC 3 months

## 2016-09-25 DIAGNOSIS — H04123 Dry eye syndrome of bilateral lacrimal glands: Secondary | ICD-10-CM | POA: Diagnosis not present

## 2016-09-25 DIAGNOSIS — H35373 Puckering of macula, bilateral: Secondary | ICD-10-CM | POA: Diagnosis not present

## 2016-09-25 DIAGNOSIS — H17823 Peripheral opacity of cornea, bilateral: Secondary | ICD-10-CM | POA: Diagnosis not present

## 2016-09-25 DIAGNOSIS — H524 Presbyopia: Secondary | ICD-10-CM | POA: Diagnosis not present

## 2016-10-03 ENCOUNTER — Encounter: Payer: Self-pay | Admitting: Podiatry

## 2016-10-03 ENCOUNTER — Ambulatory Visit (INDEPENDENT_AMBULATORY_CARE_PROVIDER_SITE_OTHER): Payer: Medicare Other | Admitting: Podiatry

## 2016-10-03 DIAGNOSIS — B351 Tinea unguium: Secondary | ICD-10-CM

## 2016-10-03 DIAGNOSIS — M79676 Pain in unspecified toe(s): Secondary | ICD-10-CM

## 2016-10-03 NOTE — Progress Notes (Signed)
Patient ID: [name], female   DOB: 03-31-1928, 83 y.o.   MRN: [id number] Complaint:  Visit Type: Patient returns to my office for continued preventative foot care services. Complaint: Patient states" my nails have grown long and thick and become painful to walk and wear shoes" . She presents for preventative foot care services. No changes to ROS.  She says she has been hospitalized with atrial fib and asthma.  Podiatric Exam: Vascular: dorsalis pedis and posterior tibial pulses are palpable bilateral. Capillary return is immediate. Temperature gradient is WNL. Skin turgor WNL  Sensorium: Normal Semmes Weinstein monofilament test. Normal tactile sensation bilaterally. Nail Exam: Pt has thick disfigured discolored nails with subungual debris noted bilateral entire nail hallux through fifth toenails Ulcer Exam: There is no evidence of ulcer or pre-ulcerative changes or infection. Orthopedic Exam: Muscle tone and strength are WNL. No limitations in general ROM. No crepitus or effusions noted. Foot type and digits show no abnormalities. Bony prominences are unremarkable. Skin: No Porokeratosis. No infection or ulcers  Diagnosis:  Tinea unguium, Pain in right toe, pain in left toes  Treatment & Plan Procedures and Treatment: Consent by patient was obtained for treatment procedures. The patient understood the discussion of treatment and procedures well. All questions were answered thoroughly reviewed. Debridement of mycotic and hypertrophic toenails, 1 through 5 bilateral and clearing of subungual debris. No ulceration, no infection noted. To be referred to [facility] concerning her frequent falls. Return Visit-Office Procedure: Patient instructed to return to the office for a follow up visit 3 months for continued evaluation and treatment.   Gardiner Barefoot DPM

## 2016-10-06 ENCOUNTER — Ambulatory Visit (INDEPENDENT_AMBULATORY_CARE_PROVIDER_SITE_OTHER): Payer: Medicare Other | Admitting: Orthotics

## 2016-10-06 DIAGNOSIS — B351 Tinea unguium: Secondary | ICD-10-CM

## 2016-10-06 NOTE — Progress Notes (Signed)
Patient presents today for evaluation for balance braces per Doctor Prudence Davidson.  Patient reports that she has had many "stumbling" incidents; she used to be involved with [employer] program but now is afraid and apprehensive about losing her balance, stumbling, and falling.   She feels very unsteady on feet, and presents with an shuffling gait.   Tests: TUG:  18 seconds w/ short strides and limited arm swings.  4-Stage Balance Test: Able to stand with feet side by side but in close contact; unable to place instep of foot to touching opposite big toe; unable to place one foot in front of the other; unable to stand on one foot.  30 second chair stand: Was able to do 8 repetitions in 30 seconds.  Based upon her history of stumbling, her falls in past, and her fear of falling, AND the face she struggled with tests, especially balance test....patient is a candidate for [employer] brace.

## 2016-10-07 ENCOUNTER — Other Ambulatory Visit: Payer: Medicare Other | Admitting: Orthotics

## 2016-10-24 ENCOUNTER — Telehealth: Payer: Self-pay | Admitting: Podiatry

## 2016-10-24 NOTE — Telephone Encounter (Signed)
lvm for pt to call to schedule an appt to pick up brace(ok to schedule with Betha per Liliane Channel).

## 2016-10-27 NOTE — Telephone Encounter (Signed)
Pt returned call and is not wanting the brace if it is not covered by insurance at 100%the patient states she was told it would be covered and at check out pt was told she would have to pay.Pt has decided to talk to Dr Prudence Davidson at her next appt before getting brace.

## 2016-10-31 ENCOUNTER — Other Ambulatory Visit: Payer: Self-pay | Admitting: Internal Medicine

## 2016-10-31 ENCOUNTER — Other Ambulatory Visit: Payer: Self-pay | Admitting: Adult Health

## 2016-11-04 ENCOUNTER — Ambulatory Visit (INDEPENDENT_AMBULATORY_CARE_PROVIDER_SITE_OTHER): Payer: Medicare Other | Admitting: Psychiatry

## 2016-11-04 DIAGNOSIS — F411 Generalized anxiety disorder: Secondary | ICD-10-CM | POA: Diagnosis not present

## 2016-11-07 ENCOUNTER — Ambulatory Visit: Payer: Self-pay | Admitting: Internal Medicine

## 2016-11-10 ENCOUNTER — Other Ambulatory Visit: Payer: Self-pay | Admitting: Dermatology

## 2016-11-10 DIAGNOSIS — L2081 Atopic neurodermatitis: Secondary | ICD-10-CM | POA: Diagnosis not present

## 2016-11-10 DIAGNOSIS — Z85820 Personal history of malignant melanoma of skin: Secondary | ICD-10-CM | POA: Diagnosis not present

## 2016-11-10 DIAGNOSIS — C4359 Malignant melanoma of other part of trunk: Secondary | ICD-10-CM | POA: Diagnosis not present

## 2016-11-10 DIAGNOSIS — D0339 Melanoma in situ of other parts of face: Secondary | ICD-10-CM | POA: Diagnosis not present

## 2016-11-10 DIAGNOSIS — D229 Melanocytic nevi, unspecified: Secondary | ICD-10-CM | POA: Diagnosis not present

## 2016-11-13 ENCOUNTER — Other Ambulatory Visit: Payer: Medicare Other

## 2016-11-13 ENCOUNTER — Telehealth: Payer: Self-pay | Admitting: Internal Medicine

## 2016-11-13 DIAGNOSIS — R399 Unspecified symptoms and signs involving the genitourinary system: Secondary | ICD-10-CM

## 2016-11-13 NOTE — Telephone Encounter (Signed)
Please advise 

## 2016-11-13 NOTE — Telephone Encounter (Signed)
Spoke w/ Pt, informed that orders have been placed.

## 2016-11-13 NOTE — Telephone Encounter (Signed)
Relation to WP:TYYP Call back [facility]: [facility] [address], [facility] [phone] (Phone) [phone] (Fax)     Reason for call:  Patient requesting urine orders due to possible UTI, patient experiencing frequent urination, urgency and back pain,please advise

## 2016-11-13 NOTE — Telephone Encounter (Signed)
Okay to provide a urine sample for UA, urine culture. DX UTI.  Treatment based on results

## 2016-11-14 ENCOUNTER — Other Ambulatory Visit (INDEPENDENT_AMBULATORY_CARE_PROVIDER_SITE_OTHER): Payer: Medicare Other

## 2016-11-14 ENCOUNTER — Other Ambulatory Visit: Payer: Self-pay

## 2016-11-14 DIAGNOSIS — R399 Unspecified symptoms and signs involving the genitourinary system: Secondary | ICD-10-CM

## 2016-11-14 LAB — URINALYSIS, ROUTINE W REFLEX MICROSCOPIC
Bilirubin Urine: NEGATIVE
HGB URINE DIPSTICK: NEGATIVE
Ketones, ur: NEGATIVE
Nitrite: NEGATIVE
SPECIFIC GRAVITY, URINE: 1.015 (ref 1.000–1.030)
TOTAL PROTEIN, URINE-UPE24: NEGATIVE
UROBILINOGEN UA: 0.2 (ref 0.0–1.0)
Urine Glucose: NEGATIVE
pH: 6 (ref 5.0–8.0)

## 2016-11-15 LAB — URINE CULTURE: ORGANISM ID, BACTERIA: NO GROWTH

## 2016-11-27 NOTE — Telephone Encounter (Signed)
Is the brace covered?

## 2016-12-05 ENCOUNTER — Ambulatory Visit (INDEPENDENT_AMBULATORY_CARE_PROVIDER_SITE_OTHER): Payer: Medicare Other | Admitting: Internal Medicine

## 2016-12-05 ENCOUNTER — Encounter: Payer: Self-pay | Admitting: Internal Medicine

## 2016-12-05 VITALS — BP 122/70 | HR 90 | Temp 97.8°F | Resp 14 | Ht 70.0 in | Wt 150.1 lb

## 2016-12-05 DIAGNOSIS — D649 Anemia, unspecified: Secondary | ICD-10-CM

## 2016-12-05 DIAGNOSIS — I4891 Unspecified atrial fibrillation: Secondary | ICD-10-CM

## 2016-12-05 DIAGNOSIS — C439 Malignant melanoma of skin, unspecified: Secondary | ICD-10-CM

## 2016-12-05 LAB — CBC WITH DIFFERENTIAL/PLATELET
BASOS ABS: 0 10*3/uL (ref 0.0–0.1)
Basophils Relative: 0.8 % (ref 0.0–3.0)
EOS ABS: 0.2 10*3/uL (ref 0.0–0.7)
Eosinophils Relative: 3 % (ref 0.0–5.0)
HCT: 37.8 % (ref 36.0–46.0)
Hemoglobin: 12.4 g/dL (ref 12.0–15.0)
LYMPHS ABS: 1.5 10*3/uL (ref 0.7–4.0)
Lymphocytes Relative: 24.3 % (ref 12.0–46.0)
MCHC: 32.7 g/dL (ref 30.0–36.0)
MCV: 94.7 fl (ref 78.0–100.0)
MONO ABS: 0.7 10*3/uL (ref 0.1–1.0)
MONOS PCT: 10.3 % (ref 3.0–12.0)
NEUTROS PCT: 61.6 % (ref 43.0–77.0)
Neutro Abs: 3.9 10*3/uL (ref 1.4–7.7)
Platelets: 199 10*3/uL (ref 150.0–400.0)
RBC: 3.99 Mil/uL (ref 3.87–5.11)
RDW: 16.5 % — ABNORMAL HIGH (ref 11.5–15.5)
WBC: 6.3 10*3/uL (ref 4.0–10.5)

## 2016-12-05 LAB — IRON: IRON: 47 ug/dL (ref 42–145)

## 2016-12-05 LAB — FERRITIN: FERRITIN: 44.1 ng/mL (ref 10.0–291.0)

## 2016-12-05 MED ORDER — BUDESONIDE-FORMOTEROL FUMARATE 160-4.5 MCG/ACT IN AERO: 2.0000 | INHALATION_SPRAY | Freq: Two times a day (BID) | RESPIRATORY_TRACT | 1 refills | Status: DC

## 2016-12-05 NOTE — Progress Notes (Signed)
Pre visit review using our clinic review tool, if applicable. No additional management support is needed unless otherwise documented below in the visit note. 

## 2016-12-05 NOTE — Progress Notes (Signed)
Subjective:    Patient ID: [name], female    DOB: [birth year], 83 y.o.   MRN: [id number]  DOS:  12/05/2016 Type of visit - description : rov Interval history: In general feeling well. COPD: Wonders how bad is her COPD,  she got an appointment to see a pulmonologist. On further questioning, she has minimal cough on and off and uses Symbicort essentially as needed with good control of symptoms. Emotionally doing well with no major problems with anxiety or depression. Noted to be mild anemia on lab review. Requested a UA recently, it came back negative for infection.   Review of Systems   Past Medical History:  Diagnosis Date  . Anxiety   . Arthritis   . Atrial fibrillation ([place])   . Cardiac arrhythmia due to congenital heart disease   . Chronic constipation   . COPD (chronic obstructive pulmonary disease) ([place])   . Depression   . GERD (gastroesophageal reflux disease)   . Hemorrhoid   . Hyperlipidemia   . Idiopathic hypotension   . Lightheadedness 11/2015  . Mycotic toenails 10/27/2012  . Parkinson disease ([place])   . Recurrent UTI   . 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 Varicose veins     Past Surgical History:  Procedure Laterality Date  . APPENDECTOMY  83 years old  . COLON RESECTION  2008  . vitriectomy  08-2015    Social History   Social History  . Marital status: Widowed    Spouse name: N/A  . Number of children: 2  . Years of education: N/A   Occupational History  . retired- Ambulance person , elementary [employer]  . Smoking status: Former Smoker    Packs/day: 2.00    Years: 30.00    Types: Cigarettes    Quit date: 04/21/1980  . Smokeless tobacco: Never Used     Comment: onset age 19 -68, up to > 1ppd (almost 2 ppd)  . Alcohol use No  . Drug use: No  . Sexual activity: No   Other Topics Concern  . Not on file   Social History Narrative   Widowed, lives alone. 1 child in [place] and 1 in [place]. 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   Still drives as of 05-20-84      Allergies as of 12/05/2016      Reactions   Adhesive [tape] Other (See Comments)   Caused blisters   Amoxicillin Other (See Comments)   Has patient had a PCN reaction causing immediate rash, facial/tongue/throat swelling, SOB or lightheadedness with hypotension: Yes Has patient had a PCN reaction causing severe rash involving mucus membranes or skin necrosis: No Has patient had a PCN reaction that required hospitalization No Has patient had a PCN reaction occurring within the last 10 years: Yes If all of the above answers are "NO", then may proceed with Cephalosporin use. Caused thrush   Ciprofloxacin Other (See Comments)   Caused thrush   Ultram [tramadol] Other (See Comments)   hypotension      Medication List       Accurate as of 12/05/16 11:59 PM. Always use your most recent med list.          acetaminophen 325 MG tablet Commonly known as:  TYLENOL Take 2 tablets (650 mg total) by mouth every 6 (six) hours as needed for mild pain (or Fever >/= 101).   albuterol 108 (90 Base)  MCG/ACT inhaler Commonly known as:  VENTOLIN HFA Inhale 2 puffs into the lungs 2 (two) times daily as needed for wheezing or shortness of breath.   apixaban 5 MG Tabs tablet Commonly known as:  ELIQUIS Take 5 mg by mouth 2 (two) times daily.   azelastine 0.1 % nasal spray Commonly known as:  ASTELIN Place 2 sprays into both nostrils at bedtime as needed for rhinitis. Use in each nostril as directed   budesonide-formoterol 160-4.5 MCG/ACT inhaler Commonly known as:  SYMBICORT Inhale 2 puffs into the lungs 2 (two) times daily.   cholecalciferol 1000 units tablet Commonly known as:  VITAMIN D Take 1,000 Units by mouth daily.   diltiazem 30 MG tablet Commonly known as:  CARDIZEM Take 1 tablet (30 mg total) by mouth 4 (four) times daily.   DULoxetine 30 MG capsule Commonly known as:  CYMBALTA Take 1 capsule (30 mg  total) by mouth daily.   ipratropium-albuterol 0.5-2.5 (3) MG/3ML Soln Commonly known as:  DUONEB Take 3 mLs by nebulization every 6 (six) hours as needed (Dyspnea/wheezing).   polyethylene glycol powder powder Commonly known as:  GLYCOLAX/MIRALAX Take 17 g by mouth 2 (two) times daily.   SYSTANE BALANCE OP Apply 2 drops to eye 2 (two) times daily.   vitamin C 1000 MG tablet Take 1,000 mg by mouth daily.          Objective:   Physical Exam BP 122/70 (BP Location: Left Arm, Patient Position: Sitting, Cuff Size: Small)   Pulse 90   Temp 97.8 F (36.6 C) (Oral)   Resp 14   Ht 5\' 10"  (1.778 m)   Wt 150 lb 2 oz (68.1 kg)   SpO2 96%   BMI 21.54 kg/m  General:   Well developed, well nourished . NAD.  HEENT:  Normocephalic . Face symmetric, atraumatic Lungs:  CTA B Normal respiratory effort, no intercostal retractions, no accessory muscle use. Heart: irreg,  no murmur.  No pretibial edema bilaterally  Skin: Not pale. Not jaundice Neurologic:  alert & oriented X3.  Speech normal, gait appropriate for age and unassisted Psych--  Cognition and judgment appear intact.  Cooperative with normal attention span and concentration.  Behavior appropriate. No anxious or depressed appearing.      Assessment & Plan:   Assessment   A1c 5.7 01-2015 Hyperlipidemia Anxiety depression  Dr Ferdinand Lango (psychology), meds per pcp (see OV note from 02-19-16) Sx onset ~ 2007:  2012, 2013 was on citalopram. Then sertraline. Unable to tolerate Prozac on Wellbutrin 02-2015 (tired, dizzy). Trial with Cymbalta 01-2016. COPD (?. No previous PFTs) CV: P-Atrial fibrillation Dr Gerrit Friends, flecanaide prn /cardiazem prn. Echo 11-2015 @ [facility] Low BP- likely d/t parkinson, rx fludrocortisone ~ 11-2015 , d/c 08-2016 GI: --GERD --Abnormal abdominal US mild pancreatic duct enlargement  --IBS used to be constipated, now diarrhea predominant --dysphagia MBE 02-22-15 wnl DJD--- take Celebrex very  seldom Recurrent UTIs -- qd abx prn  , has seen urology Dr Edwena Blow before NEURO: ---Parkinsonism ---  Dr Tat ---Gait d/o (multifactorial likely) ---Peripheral neuropathy (pr neuro note 04-2015) ---mild cognitive impairmentairment (see neuro note 04-2015), dx confirmed by neuropsychiatry evaluation 07-2015 ---MBE 02/22/2015 normal Melanoma, dx 2018 admitted  01-2015: COPD and RVR  PLAN   Anxiety depression: Seems to be doing well on Cymbalta COPD (?): Carries the dx of COPD, on chart review she has cough that respond to inhalers, no documentation of PFTs. Recommend no further testings since she is doing well with inhalers prn  A fibrillation: Rate is slightly elevated today but usually okay. Continue eliquis, Cardizem. Mild anemia per chart review: Recheck in a CBC, iron and ferritin. Melanoma: Recently diagnosed with melanoma, left face, to have more extensive surgery soon. RTC 4 months, CPX.

## 2016-12-05 NOTE — Patient Instructions (Signed)
GO TO THE LAB : Get the blood work     GO TO THE FRONT DESK Schedule your next appointment for a  yearly checkup in 4 months,  Consider do a Medicare wellness with one of our nurses

## 2016-12-06 ENCOUNTER — Encounter: Payer: Self-pay | Admitting: Internal Medicine

## 2016-12-06 DIAGNOSIS — C439 Malignant melanoma of skin, unspecified: Secondary | ICD-10-CM

## 2016-12-06 HISTORY — DX: Malignant melanoma of skin, unspecified: C43.9

## 2016-12-06 NOTE — Assessment & Plan Note (Signed)
Anxiety depression: Seems to be doing well on Cymbalta COPD (?): Carries the dx of COPD, on chart review she has cough that respond to inhalers, no documentation of PFTs. Recommend no further testings since she is doing well with inhalers prn  A fibrillation: Rate is slightly elevated today but usually okay. Continue eliquis, Cardizem. Mild anemia per chart review: Recheck in a CBC, iron and ferritin. Melanoma: Recently diagnosed with melanoma, left face, to have more extensive surgery soon. RTC 4 months, CPX.

## 2016-12-10 ENCOUNTER — Ambulatory Visit (INDEPENDENT_AMBULATORY_CARE_PROVIDER_SITE_OTHER): Payer: Medicare Other | Admitting: Psychiatry

## 2016-12-10 DIAGNOSIS — F411 Generalized anxiety disorder: Secondary | ICD-10-CM | POA: Diagnosis not present

## 2016-12-16 ENCOUNTER — Ambulatory Visit: Payer: Self-pay | Admitting: Pulmonary Disease

## 2016-12-16 ENCOUNTER — Ambulatory Visit (INDEPENDENT_AMBULATORY_CARE_PROVIDER_SITE_OTHER): Payer: Medicare Other | Admitting: Pulmonary Disease

## 2016-12-16 ENCOUNTER — Encounter: Payer: Self-pay | Admitting: Pulmonary Disease

## 2016-12-16 VITALS — BP 104/66 | HR 123 | Ht 70.0 in | Wt 147.6 lb

## 2016-12-16 DIAGNOSIS — J449 Chronic obstructive pulmonary disease, unspecified: Secondary | ICD-10-CM

## 2016-12-16 NOTE — Addendum Note (Signed)
Addended by: Maryanna Shape A on: 12/16/2016 02:04 PM   Modules accepted: Orders

## 2016-12-16 NOTE — Patient Instructions (Signed)
Please start using his Symbicort every day regularly. Uses 2 puffs twice daily Use albuterol inhaler as a rescue medication. You need to use this as needed for increased dyspnea, wheezing  You can use over-the-counter Mucinex for chest congestion We will scheduled you for pulmonary function tests  Return to clinic in 3 months.

## 2016-12-16 NOTE — Progress Notes (Signed)
[name]    [id number]    [birth year]  Primary Care Physician:Paz, Alda Berthold, MD  Referring Physician: Colon Branch, MD [address] [place], [place] [address]  Chief complaint:   Follow-up for COPD GOLD B  HPI: [name] is a 83 year old with COPD GOLD B (CAT score 25, FEV1 76% on 2013).  She is just maintained on Symbicort but is not using it on a regular basis. Reports increasing dyspnea with cough, congestion, wheezing. She has started yoga class and feels she cannot keep up with exercises due to dyspnea. She had an hospitalization in May 2018 for PNA Vs bronchitis. She is due for resection of melanoma in situ over her left cheek.  Pets: Cats.  Occupation: Retired Licensed conveyancer Exposures: None Smoking history: 60-pack-year smoking history. Quit in 1982. No alcohol, drug use. She is widowed and lives alone.  Outpatient Encounter Prescriptions as of 12/16/2016  Medication Sig  . acetaminophen (TYLENOL) 325 MG tablet Take 2 tablets (650 mg total) by mouth every 6 (six) hours as needed for mild pain (or Fever >/= 101).  Marland Kitchen albuterol (VENTOLIN HFA) 108 (90 BASE) MCG/ACT inhaler Inhale 2 puffs into the lungs 2 (two) times daily as needed for wheezing or shortness of breath.  Marland Kitchen apixaban (ELIQUIS) 5 MG TABS tablet Take 5 mg by mouth 2 (two) times daily.  . Ascorbic Acid (VITAMIN C) 1000 MG tablet Take 1,000 mg by mouth daily.  Marland Kitchen azelastine (ASTELIN) 0.1 % nasal spray Place 2 sprays into both nostrils at bedtime as needed for rhinitis. Use in each nostril as directed  . budesonide-formoterol (SYMBICORT) 160-4.5 MCG/ACT inhaler Inhale 2 puffs into the lungs 2 (two) times daily.  . cholecalciferol (VITAMIN D) 1000 units tablet Take 1,000 Units by mouth daily.  Marland Kitchen diltiazem (CARDIZEM) 30 MG tablet Take 1 tablet (30 mg total) by mouth 4 (four) times daily.  . DULoxetine (CYMBALTA) 30 MG capsule Take 1 capsule (30 mg total) by mouth daily.  Marland Kitchen ipratropium-albuterol  (DUONEB) 0.5-2.5 (3) MG/3ML SOLN Take 3 mLs by nebulization every 6 (six) hours as needed (Dyspnea/wheezing).  . polyethylene glycol powder (GLYCOLAX/MIRALAX) powder Take 17 g by mouth 2 (two) times daily.  Marland Kitchen Propylene Glycol (SYSTANE BALANCE OP) Apply 2 drops to eye 2 (two) times daily.    No facility-administered encounter medications on file as of 12/16/2016.     Allergies as of 12/16/2016 - Review Complete 12/16/2016  Allergen Reaction Noted  . Adhesive [tape] Other (See Comments) 03/26/2011  . Amoxicillin Other (See Comments)   . Ciprofloxacin Other (See Comments)   . Ultram [tramadol] Other (See Comments) 04/02/2012    Past Medical History:  Diagnosis Date  . Anxiety   . Arthritis   . Atrial fibrillation ([place])   . Cardiac arrhythmia due to congenital heart disease   . Chronic constipation   . COPD (chronic obstructive pulmonary disease) ([place])   . Depression   . GERD (gastroesophageal reflux disease)   . Hemorrhoid   . Hyperlipidemia   . Idiopathic hypotension   . Lightheadedness 11/2015  . Melanoma of skin ([place]) 12/06/2016  . Mycotic toenails 10/27/2012  . Parkinson disease ([place])   . Recurrent UTI   . 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 Varicose veins     Past Surgical History:  Procedure Laterality Date  . APPENDECTOMY  83 years old  . COLON RESECTION  2008  . vitriectomy  08-2015  Family History  Problem Relation Age of Onset  . Asthma Brother   . Alcohol abuse Brother   . Throat cancer Father   . Alcohol abuse Father   . Lupus Daughter   . Bipolar disorder Daughter   . Lupus Daughter   . Anxiety disorder Daughter   . Alcohol abuse Sister   . Alcohol abuse Maternal Grandfather   . Alcohol abuse Paternal Grandmother   . Breast cancer Sister   . Colon cancer Neg Hx     Social History   Social History  . Marital status: Widowed    Spouse name: N/A  . Number of children: 2  . Years of education: N/A   Occupational History  . retired- Media planner , elementary school [employer]  . Smoking status: Former Smoker    Packs/day: 2.00    Years: 30.00    Types: Cigarettes    Quit date: 04/21/1980  . Smokeless tobacco: Never Used     Comment: onset age 69 -29, up to > 1ppd (almost 2 ppd)  . Alcohol use No  . Drug use: No  . Sexual activity: No   Other Topics Concern  . Not on file   Social History Narrative   Widowed, lives alone. 1 child in [place] and 1 in [place]. 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   Still drives as of 2-69-48    Review of systems: Review of Systems  Constitutional: Negative for fever and chills.  HENT: Negative.   Eyes: Negative for blurred vision.  Respiratory: as per HPI  Cardiovascular: Negative for chest pain and palpitations.  Gastrointestinal: Negative for vomiting, diarrhea, blood per rectum. Genitourinary: Negative for dysuria, urgency, frequency and hematuria.  Musculoskeletal: Negative for myalgias, back pain and joint pain.  Skin: Negative for itching and rash.  Neurological: Negative for dizziness, tremors, focal weakness, seizures and loss of consciousness.  Endo/Heme/Allergies: Negative for environmental allergies.  Psychiatric/Behavioral: Negative for depression, suicidal ideas and hallucinations.  All other systems reviewed and are negative.  Physical Exam: Blood pressure 104/66, pulse (!) 123, height 5\' 10"  (1.778 m), weight 67 kg (147 lb 9.6 oz), SpO2 96 %. Gen:      No acute distress HEENT:  EOMI, sclera anicteric Neck:     No masses; no thyromegaly Lungs:    Clear to auscultation bilaterally; normal respiratory effort CV:         Regular rate and rhythm; no murmurs Abd:      + bowel sounds; soft, non-tender; no palpable masses, no distension Ext:    No edema; adequate peripheral perfusion Skin:      Warm and dry; no rash Neuro: alert and oriented x 3 Psych: normal mood and affect  Data Reviewed: PFTs 05/14/11 FVC 2.79 [79%) FEV1 1.91  [76%) F/F 69. Mild obstructive defect.  CXR 06/18/15 Bronchitis.  CXR 08/26/16 Hyperinflation, atelectasis at the bases. I have reviewed all images personally.  Assessment:  COPD GOLD B She appears to have mild increase in symptoms but is using the Symbicort only intermittently. I have encouraged her to use this on a regular basis. We will get pulmonary function tests for more recent evaluation of her lung function  I have asked her to use Mucinex as needed over-the-counter for chest congestion. Have encouraged her to continue her exercise regimen with yoga.  Plan/Recommendations: - Use symbicort regularly and albuterol as needed - Mucinex OTC for chest congestion -  PFTs  Marshell Garfinkel MD [facility] Pager [phone] 12/16/2016, 1:32 PM  CC: Colon Branch, MD

## 2016-12-18 DIAGNOSIS — L989 Disorder of the skin and subcutaneous tissue, unspecified: Secondary | ICD-10-CM | POA: Diagnosis not present

## 2016-12-18 DIAGNOSIS — L905 Scar conditions and fibrosis of skin: Secondary | ICD-10-CM | POA: Diagnosis not present

## 2016-12-18 DIAGNOSIS — D0339 Melanoma in situ of other parts of face: Secondary | ICD-10-CM | POA: Diagnosis not present

## 2017-01-02 ENCOUNTER — Ambulatory Visit (INDEPENDENT_AMBULATORY_CARE_PROVIDER_SITE_OTHER): Payer: Medicare Other | Admitting: Podiatry

## 2017-01-02 ENCOUNTER — Encounter: Payer: Self-pay | Admitting: Podiatry

## 2017-01-02 DIAGNOSIS — B351 Tinea unguium: Secondary | ICD-10-CM

## 2017-01-02 DIAGNOSIS — M79676 Pain in unspecified toe(s): Secondary | ICD-10-CM | POA: Diagnosis not present

## 2017-01-02 NOTE — Progress Notes (Signed)
Patient ID: [name], female   DOB: 1927-07-26, 83 y.o.   MRN: [id number] Complaint:  Visit Type: Patient returns to my office for continued preventative foot care services. Complaint: Patient states" my nails have grown long and thick and become painful to walk and wear shoes" . She presents for preventative foot care services. No changes to ROS.  She says she has been hospitalized with atrial fib and asthma.  Podiatric Exam: Vascular: dorsalis pedis and posterior tibial pulses are palpable bilateral. Capillary return is immediate. Temperature gradient is WNL. Skin turgor WNL  Sensorium: Normal Semmes Weinstein monofilament test. Normal tactile sensation bilaterally. Nail Exam: Pt has thick disfigured discolored nails with subungual debris noted bilateral entire nail hallux through fifth toenails Ulcer Exam: There is no evidence of ulcer or pre-ulcerative changes or infection. Orthopedic Exam: Muscle tone and strength are WNL. No limitations in general ROM. No crepitus or effusions noted. Foot type and digits show no abnormalities. Bony prominences are unremarkable. Skin: No Porokeratosis. No infection or ulcers  Diagnosis:  Tinea unguium, Pain in right toe, pain in left toes  Treatment & Plan Procedures and Treatment: Consent by patient was obtained for treatment procedures. The patient understood the discussion of treatment and procedures well. All questions were answered thoroughly reviewed. Debridement of mycotic and hypertrophic toenails, 1 through 5 bilateral and clearing of subungual debris. No ulceration, no infection noted.  Return Visit-Office Procedure: Patient instructed to return to the office for a follow up visit 3 months for continued evaluation and treatment.   Gardiner Barefoot DPM

## 2017-01-06 ENCOUNTER — Telehealth: Payer: Self-pay | Admitting: Internal Medicine

## 2017-01-06 NOTE — Telephone Encounter (Signed)
Please advise 

## 2017-01-06 NOTE — Telephone Encounter (Signed)
If she has severe symptoms, stomach pain, blood in the stools or fever and chills: Needs to be seen Otherwise needs to take MiraLAX daily, use a Fleet enema or a glycerin suppository as needed.

## 2017-01-06 NOTE — Telephone Encounter (Signed)
°  Relation to RP:ZPSU Call back number: [phone]  [facility] [place], [facility] [phone] (Phone) [phone] (Fax)     Reason for call:  Patient in need of clinical advice regarding constipation, patient would like PCP OTC recommendation, please advise

## 2017-01-06 NOTE — Telephone Encounter (Signed)
Spoke w/ Pt, informed of recommendations. Pt already using Miralax in AM and PM, eating high fiber diet. She will try either fleet enema, or glycerin suppositories- knows to use only several times and if still not able to produce BM or has abdominal pain, fever, chills, n/v, blood in stools to proceed to ED.

## 2017-01-06 NOTE — Telephone Encounter (Signed)
Tried calling Pt, no answer, unable to leave voice message. Will try calling again later.

## 2017-01-08 NOTE — Progress Notes (Addendum)
Subjective:   [name] is a 83 y.o. female who presents for Medicare Annual (Subsequent) preventive examination. Pt complains that she does not feel like her anxiety medicine is working, and that she and her dtr thinks she needs to schedule appt with PCP to discuss different tx.   Review of Systems:  No ROS.  Medicare Wellness Visit. Additional risk factors are reflected in the social history.  Cardiac Risk Factors include: advanced age (>100men, >53 women) Sleep patterns: Sleeps 8p-7a. Wakes 3x to urinate. Feels rested when she wakes.  Home Safety/Smoke Alarms: Feels safe in home. Smoke alarms in place.  Living environment; residence and Firearm Safety: Lives alone with cats. [employer] comes 1x week and help with cleaning and errands. Wears safety alert bracelet. Seat Belt Safety/Bike Helmet: Wears seat belt.   Female:   Mammo- No longer doing routine screening due to age.  Dexa scan-  No longer doing routine screening due to age.     Objective:     Vitals: BP 103/68 (BP Location: Right Arm, Patient Position: Sitting, Cuff Size: Normal)   Pulse 94   Ht 5\' 10"  (1.778 m)   Wt 155 lb 3.2 oz (70.4 kg)   SpO2 97%   BMI 22.27 kg/m   Body mass index is 22.27 kg/m.   Tobacco History  Smoking Status  . Former Smoker  . Packs/day: 2.00  . Years: 30.00  . Types: Cigarettes  . Quit date: 04/21/1980  Smokeless Tobacco  . Never Used    Comment: onset age 62 -44, up to > 1ppd (almost 2 ppd)     Counseling given: Not Answered   Past Medical History:  Diagnosis Date  . Anxiety   . Arthritis   . Atrial fibrillation ([place])   . Cardiac arrhythmia due to congenital heart disease   . Chronic constipation   . COPD (chronic obstructive pulmonary disease) ([place])   . Depression   . GERD (gastroesophageal reflux disease)   . Hemorrhoid   . Hyperlipidemia   . Idiopathic hypotension   . Lightheadedness 11/2015  . Melanoma of skin ([place]) 12/06/2016  . Mycotic toenails  10/27/2012  . Parkinson disease ([place])   . Recurrent UTI   . 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 Varicose veins    Past Surgical History:  Procedure Laterality Date  . APPENDECTOMY  83 years old  . COLON RESECTION  2008  . melanoma removal Left    left cheek  . vitriectomy  08-2015   Family History  Problem Relation Age of Onset  . Asthma Brother   . Alcohol abuse Brother   . Throat cancer Father   . Alcohol abuse Father   . Lupus Daughter   . Bipolar disorder Daughter   . Lupus Daughter   . Anxiety disorder Daughter   . Alcohol abuse Sister   . Alcohol abuse Maternal Grandfather   . Alcohol abuse Paternal Grandmother   . Breast cancer Sister   . Colon cancer Neg Hx    History  Sexual Activity  . Sexual activity: No    Outpatient Encounter Prescriptions as of 01/12/2017  Medication Sig  . acetaminophen (TYLENOL) 325 MG tablet Take 2 tablets (650 mg total) by mouth every 6 (six) hours as needed for mild pain (or Fever >/= 101).  Marland Kitchen albuterol (VENTOLIN HFA) 108 (90 BASE) MCG/ACT inhaler Inhale 2 puffs into the lungs 2 (two) times daily as needed for wheezing or shortness of breath.  Marland Kitchen apixaban (  ELIQUIS) 5 MG TABS tablet Take 5 mg by mouth 2 (two) times daily.  . Ascorbic Acid (VITAMIN C) 1000 MG tablet Take 1,000 mg by mouth daily.  Marland Kitchen azelastine (ASTELIN) 0.1 % nasal spray Place 2 sprays into both nostrils at bedtime as needed for rhinitis. Use in each nostril as directed  . budesonide-formoterol (SYMBICORT) 160-4.5 MCG/ACT inhaler Inhale 2 puffs into the lungs 2 (two) times daily.  . cholecalciferol (VITAMIN D) 1000 units tablet Take 1,000 Units by mouth daily.  Marland Kitchen diltiazem (CARDIZEM) 30 MG tablet Take 1 tablet (30 mg total) by mouth 4 (four) times daily. (Patient taking differently: Take 30 mg by mouth 3 (three) times daily. )  . DULoxetine (CYMBALTA) 30 MG capsule Take 1 capsule (30 mg total) by mouth daily.  Marland Kitchen ipratropium-albuterol (DUONEB) 0.5-2.5 (3) MG/3ML SOLN Take  3 mLs by nebulization every 6 (six) hours as needed (Dyspnea/wheezing).  . polyethylene glycol powder (GLYCOLAX/MIRALAX) powder Take 17 g by mouth 2 (two) times daily.  Marland Kitchen Propylene Glycol (SYSTANE BALANCE OP) Apply 2 drops to eye 2 (two) times daily.    No facility-administered encounter medications on file as of 01/12/2017.     Activities of Daily Living In your present state of health, do you have any difficulty performing the following activities: 01/12/2017 08/26/2016  Hearing? Tempie Donning  Comment Not wearing hearing aids today. -  Vision? Y N  Comment wearing glasses. Eye doctor yearly. -  Difficulty concentrating or making decisions? Tempie Donning  Walking or climbing stairs? Y Y  Comment uses cane -  Dressing or bathing? N N  Doing errands, shopping? Tempie Donning  Comment Has someone help her shop once per week. -  Preparing Food and eating ? N -  Using the Toilet? N -  In the past six months, have you accidently leaked urine? Y -  Comment wears pad -  Do you have problems with loss of bowel control? N -  Managing your Medications? N -  Managing your Finances? Y -  Comment her daughter helps her -  Housekeeping or managing your Housekeeping? N -  Comment someone helps her once per week -  Some recent data might be hidden    Patient Care Team: Colon Branch, MD as PCP - General (Internal Medicine) Adrian Prows, MD as Attending Physician (Cardiology) Leta Baptist, Earlean Polka, MD as Consulting Physician (Neurology) Marshell Garfinkel, MD as Consulting Physician (Pulmonary Disease) Tat, Eustace Quail, DO as Consulting Physician (Neurology) Ladene Artist, MD as Consulting Physician (Gastroenterology) Gardiner Barefoot, DPM as Consulting Physician (Podiatry)    Assessment:    Physical assessment deferred to PCP.  Exercise Activities and Dietary recommendations Current Exercise Habits: The patient does not participate in regular exercise at present, Exercise limited by: orthopedic condition(s)   Diet (meal  preparation, eat out, water intake, caffeinated beverages, dairy products, fruits and vegetables):   Healthy diet  Goals    . Maintain current health      Fall Risk Fall Risk  01/12/2017 07/23/2016 12/05/2015 06/18/2015 03/02/2015  Falls in the past year? No No Yes No No  Number falls in past yr: - - 1 - -  Injury with Fall? - - Yes - -  Follow up - - Falls evaluation completed - -   Depression Screen PHQ 2/9 Scores 01/12/2017 07/23/2016 06/18/2015 05/26/2015  PHQ - 2 Score 0 0 2 0  PHQ- 9 Score - - 6 -  Exception Documentation - - Medical reason -  Cognitive Function MMSE - Mini Mental State Exam 01/12/2017  Orientation to time 5  Orientation to Place 5  Registration 3  Attention/ Calculation 5  Recall 2  Language- name 2 objects 2  Language- repeat 1  Language- follow 3 step command 3  Language- read & follow direction 1  Write a sentence 1  Copy design 1  Total score 29   Montreal Cognitive Assessment  04/26/2015  Visuospatial/ Executive (0/5) 3  Naming (0/3) 3  Attention: Read list of digits (0/2) 2  Attention: Read list of letters (0/1) 1  Attention: Serial 7 subtraction starting at 100 (0/3) 1  Language: Repeat phrase (0/2) 2  Language : Fluency (0/1) 0  Abstraction (0/2) 2  Delayed Recall (0/5) 0  Orientation (0/6) 5  Total 19  Adjusted Score (based on education) 20      Immunization History  Administered Date(s) Administered  . Influenza Split 01/20/2011  . Influenza Whole 02/19/2012  . Influenza,inj,Quad PF,6+ Mos 06/03/2013, 02/02/2015  . Influenza-Unspecified 01/13/2014, 01/24/2016  . Pneumococcal Conjugate-13 03/07/2014  . Pneumococcal Polysaccharide-23 12/01/2012  . Td 12/01/2012  . Zoster 03/10/2015   Screening Tests Health Maintenance  Topic Date Due  . INFLUENZA VACCINE  11/19/2016  . TETANUS/TDAP  12/02/2022  . DEXA SCAN  Completed  . PNA vac Low Risk Adult  Completed      Plan:   Follow up with Dr.Paz as scheduled to discuss you anxiety  issues 01/19/17.  Continue to eat heart healthy diet (full of fruits, vegetables, whole grains, lean protein, water--limit salt, fat, and sugar intake) and increase physical activity as tolerated.  Continue doing brain stimulating activities (puzzles, reading, adult coloring books, staying active) to keep memory sharp.    I have personally reviewed and noted the following in the patient's chart:   . Medical and social history . Use of alcohol, tobacco or illicit drugs  . Current medications and supplements . Functional ability and status . Nutritional status . Physical activity . Advanced directives . List of other physicians . Hospitalizations, surgeries, and ER visits in previous 12 months . Vitals . Screenings to include cognitive, depression, and falls . Referrals and appointments  In addition, I have reviewed and discussed with patient certain preventive protocols, quality metrics, and best practice recommendations. A written personalized care plan for preventive services as well as general preventive health recommendations were provided to patient.     Naaman Plummer [place], [place]  01/12/2017  Kathlene November, MD

## 2017-01-12 ENCOUNTER — Ambulatory Visit (INDEPENDENT_AMBULATORY_CARE_PROVIDER_SITE_OTHER): Payer: Medicare Other | Admitting: *Deleted

## 2017-01-12 ENCOUNTER — Encounter: Payer: Self-pay | Admitting: *Deleted

## 2017-01-12 VITALS — BP 103/68 | HR 94 | Ht 70.0 in | Wt 155.2 lb

## 2017-01-12 DIAGNOSIS — Z0000 Encounter for general adult medical examination without abnormal findings: Secondary | ICD-10-CM | POA: Diagnosis not present

## 2017-01-12 NOTE — Patient Instructions (Signed)
[name] , Thank you for taking time to come for your Medicare Wellness Visit. I appreciate your ongoing commitment to your health goals. Please review the following plan we discussed and let me know if I can assist you in the future.   These are the goals we discussed: Goals    . Maintain current health       This is a list of the screening recommended for you and due dates:  Health Maintenance  Topic Date Due  . Flu Shot  11/19/2016  . Tetanus Vaccine  12/02/2022  . DEXA scan (bone density measurement)  Completed  . Pneumonia vaccines  Completed    Follow up with Dr.Paz as scheduled to discuss you anxiety issues 01/19/17.  Continue to eat heart healthy diet (full of fruits, vegetables, whole grains, lean protein, water--limit salt, fat, and sugar intake) and increase physical activity as tolerated.  Continue doing brain stimulating activities (puzzles, reading, adult coloring books, staying active) to keep memory sharp.   Health Maintenance for Postmenopausal Women Menopause is a normal process in which your reproductive ability comes to an end. This process happens gradually over a span of months to years, usually between the ages of 41 and 59. Menopause is complete when you have missed 12 consecutive menstrual periods. It is important to talk with your health care provider about some of the most common conditions that affect postmenopausal women, such as heart disease, cancer, and bone loss (osteoporosis). Adopting a healthy lifestyle and getting preventive care can help to promote your health and wellness. Those actions can also lower your chances of developing some of these common conditions. What should I know about menopause? During menopause, you may experience a number of symptoms, such as:  Moderate-to-severe hot flashes.  Night sweats.  Decrease in sex drive.  Mood swings.  Headaches.  Tiredness.  Irritability.  Memory problems.  Insomnia.  Choosing to  treat or not to treat menopausal changes is an individual decision that you make with your health care provider. What should I know about hormone replacement therapy and supplements? Hormone therapy products are effective for treating symptoms that are associated with menopause, such as hot flashes and night sweats. Hormone replacement carries certain risks, especially as you become older. If you are thinking about using estrogen or estrogen with progestin treatments, discuss the benefits and risks with your health care provider. What should I know about heart disease and stroke? Heart disease, heart attack, and stroke become more likely as you age. This may be due, in part, to the hormonal changes that your body experiences during menopause. These can affect how your body processes dietary fats, triglycerides, and cholesterol. Heart attack and stroke are both medical emergencies. There are many things that you can do to help prevent heart disease and stroke:  Have your blood pressure checked at least every 1-2 years. High blood pressure causes heart disease and increases the risk of stroke.  If you are 72-34 years old, ask your health care provider if you should take aspirin to prevent a heart attack or a stroke.  Do not use any tobacco products, including cigarettes, chewing tobacco, or electronic cigarettes. If you need help quitting, ask your health care provider.  It is important to eat a healthy diet and maintain a healthy weight. ? Be sure to include plenty of vegetables, fruits, low-fat dairy products, and lean protein. ? Avoid eating foods that are high in solid fats, added sugars, or salt (sodium).  Get  regular exercise. This is one of the most important things that you can do for your health. ? Try to exercise for at least 150 minutes each week. The type of exercise that you do should increase your heart rate and make you sweat. This is known as moderate-intensity exercise. ? Try to do  strengthening exercises at least twice each week. Do these in addition to the moderate-intensity exercise.  Know your numbers.Ask your health care provider to check your cholesterol and your blood glucose. Continue to have your blood tested as directed by your health care provider.  What should I know about cancer screening? There are several types of cancer. Take the following steps to reduce your risk and to catch any cancer development as early as possible. Breast Cancer  Practice breast self-awareness. ? This means understanding how your breasts normally appear and feel. ? It also means doing regular breast self-exams. Let your health care provider know about any changes, no matter how small.  If you are 40 or older, have a clinician do a breast exam (clinical breast exam or CBE) every year. Depending on your age, family history, and medical history, it may be recommended that you also have a yearly breast X-ray (mammogram).  If you have a family history of breast cancer, talk with your health care provider about genetic screening.  If you are at high risk for breast cancer, talk with your health care provider about having an MRI and a mammogram every year.  Breast cancer (BRCA) gene test is recommended for women who have family members with BRCA-related cancers. Results of the assessment will determine the need for genetic counseling and BRCA1 and for BRCA2 testing. BRCA-related cancers include these types: ? Breast. This occurs in males or females. ? Ovarian. ? Tubal. This may also be called fallopian tube cancer. ? Cancer of the abdominal or pelvic lining (peritoneal cancer). ? Prostate. ? Pancreatic.  Cervical, Uterine, and Ovarian Cancer Your health care provider may recommend that you be screened regularly for cancer of the pelvic organs. These include your ovaries, uterus, and vagina. This screening involves a pelvic exam, which includes checking for microscopic changes to the  surface of your cervix (Pap test).  For women ages 21-65, health care providers may recommend a pelvic exam and a Pap test every three years. For women ages 33-65, they may recommend the Pap test and pelvic exam, combined with testing for human papilloma virus (HPV), every five years. Some types of HPV increase your risk of cervical cancer. Testing for HPV may also be done on women of any age who have unclear Pap test results.  Other health care providers may not recommend any screening for nonpregnant women who are considered low risk for pelvic cancer and have no symptoms. Ask your health care provider if a screening pelvic exam is right for you.  If you have had past treatment for cervical cancer or a condition that could lead to cancer, you need Pap tests and screening for cancer for at least 20 years after your treatment. If Pap tests have been discontinued for you, your risk factors (such as having a new sexual partner) need to be reassessed to determine if you should start having screenings again. Some women have medical problems that increase the chance of getting cervical cancer. In these cases, your health care provider may recommend that you have screening and Pap tests more often.  If you have a family history of uterine cancer or ovarian cancer,  talk with your health care provider about genetic screening.  If you have vaginal bleeding after reaching menopause, tell your health care provider.  There are currently no reliable tests available to screen for ovarian cancer.  Lung Cancer Lung cancer screening is recommended for adults 76-92 years old who are at high risk for lung cancer because of a history of smoking. A yearly low-dose CT scan of the lungs is recommended if you:  Currently smoke.  Have a history of at least 30 pack-years of smoking and you currently smoke or have quit within the past 15 years. A pack-year is smoking an average of one pack of cigarettes per day for one  year.  Yearly screening should:  Continue until it has been 15 years since you quit.  Stop if you develop a health problem that would prevent you from having lung cancer treatment.  Colorectal Cancer  This type of cancer can be detected and can often be prevented.  Routine colorectal cancer screening usually begins at age 47 and continues through age 59.  If you have risk factors for colon cancer, your health care provider may recommend that you be screened at an earlier age.  If you have a family history of colorectal cancer, talk with your health care provider about genetic screening.  Your health care provider may also recommend using home test kits to check for hidden blood in your stool.  A small camera at the end of a tube can be used to examine your colon directly (sigmoidoscopy or colonoscopy). This is done to check for the earliest forms of colorectal cancer.  Direct examination of the colon should be repeated every 5-10 years until age 75. However, if early forms of precancerous polyps or small growths are found or if you have a family history or genetic risk for colorectal cancer, you may need to be screened more often.  Skin Cancer  Check your skin from head to toe regularly.  Monitor any moles. Be sure to tell your health care provider: ? About any new moles or changes in moles, especially if there is a change in a mole's shape or color. ? If you have a mole that is larger than the size of a pencil eraser.  If any of your family members has a history of skin cancer, especially at a young age, talk with your health care provider about genetic screening.  Always use sunscreen. Apply sunscreen liberally and repeatedly throughout the day.  Whenever you are outside, protect yourself by wearing long sleeves, pants, a wide-brimmed hat, and sunglasses.  What should I know about osteoporosis? Osteoporosis is a condition in which bone destruction happens more quickly than  new bone creation. After menopause, you may be at an increased risk for osteoporosis. To help prevent osteoporosis or the bone fractures that can happen because of osteoporosis, the following is recommended:  If you are 77-50 years old, get at least 1,000 mg of calcium and at least 600 mg of vitamin D per day.  If you are older than age 96 but younger than age 66, get at least 1,200 mg of calcium and at least 600 mg of vitamin D per day.  If you are older than age 26, get at least 1,200 mg of calcium and at least 800 mg of vitamin D per day.  Smoking and excessive alcohol intake increase the risk of osteoporosis. Eat foods that are rich in calcium and vitamin D, and do weight-bearing exercises several times each  week as directed by your health care provider. What should I know about how menopause affects my mental health? Depression may occur at any age, but it is more common as you become older. Common symptoms of depression include:  Low or sad mood.  Changes in sleep patterns.  Changes in appetite or eating patterns.  Feeling an overall lack of motivation or enjoyment of activities that you previously enjoyed.  Frequent crying spells.  Talk with your health care provider if you think that you are experiencing depression. What should I know about immunizations? It is important that you get and maintain your immunizations. These include:  Tetanus, diphtheria, and pertussis (Tdap) booster vaccine.  Influenza every year before the flu season begins.  Pneumonia vaccine.  Shingles vaccine.  Your health care provider may also recommend other immunizations. This information is not intended to replace advice given to you by your health care provider. Make sure you discuss any questions you have with your health care provider. Document Released: 05/30/2005 Document Revised: 10/26/2015 Document Reviewed: 01/09/2015 Elsevier Interactive Patient Education  2018 [employer].

## 2017-01-15 IMAGING — CT CT ABD-PELV W/ CM
2 of 5 series · 16 of 46 positions shown, 18 images · IV contrast (Omnipaque 300)
Comparison: CT 03/24/2012.  Abdominal ultrasound 12/27/2014.

CLINICAL DATA: Intermittent right lower quadrant abdominal pain.
History of the ileal resection for obstruction with appendectomy. No
history of malignancy. Initial encounter.

EXAM:
CT ABDOMEN AND PELVIS WITH CONTRAST
TECHNIQUE: Multidetector CT imaging of the abdomen and pelvis was performed
using the standard protocol following bolus administration of
intravenous contrast.
CONTRAST:  100mL OMNIPAQUE IOHEXOL 300 MG/ML  SOLN

[Series 2: abd/ pel 5mm · axial · 0.67mm/px · z∈[-464,-84]mm · 13 of 86 slices shown, 15 images]
[im 5/86  soft-tissue]
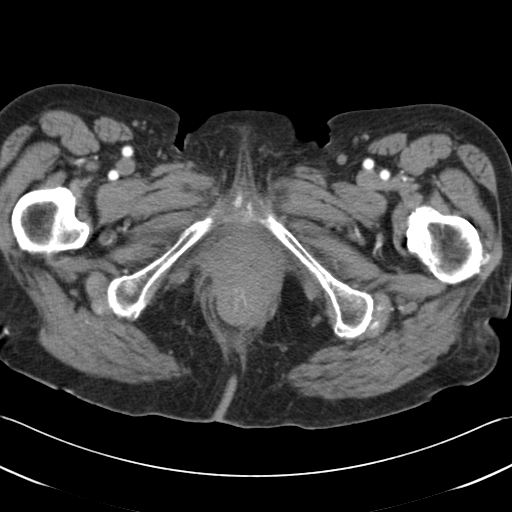
[im 5/86  bone]
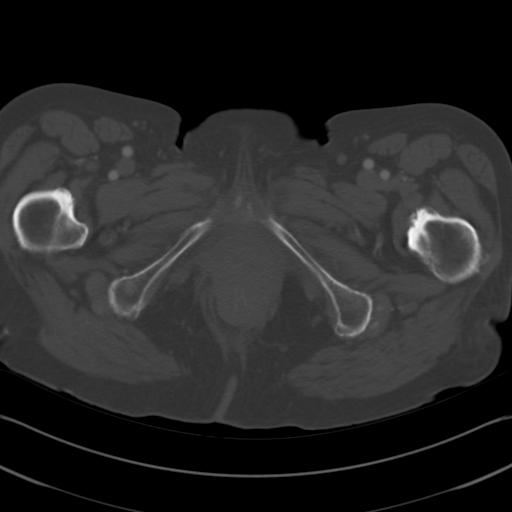
[im 10/86  soft-tissue]
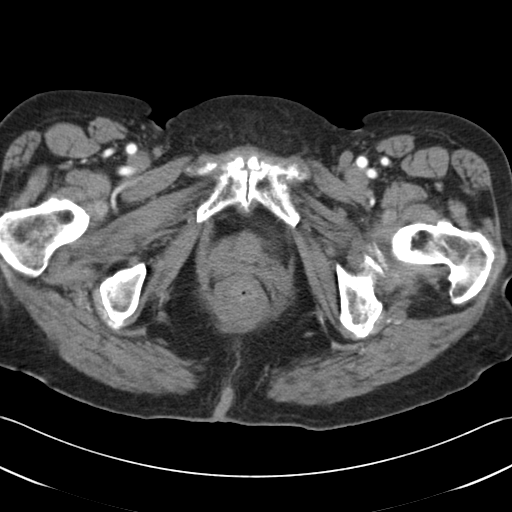
[im 19/86  soft-tissue]
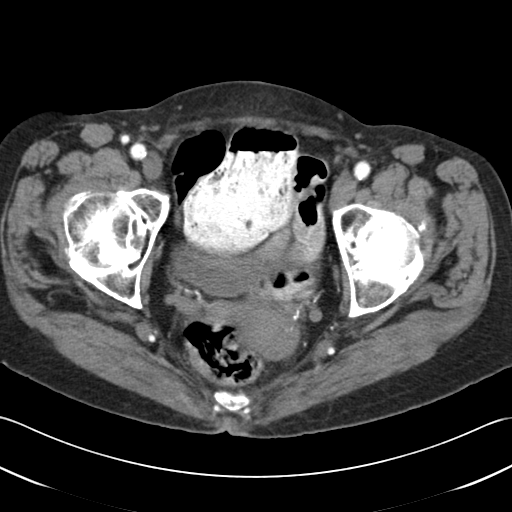
[im 24/86  soft-tissue]
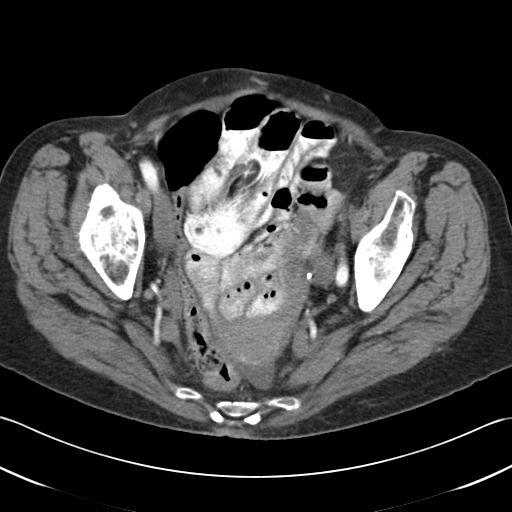
[im 29/86  soft-tissue]
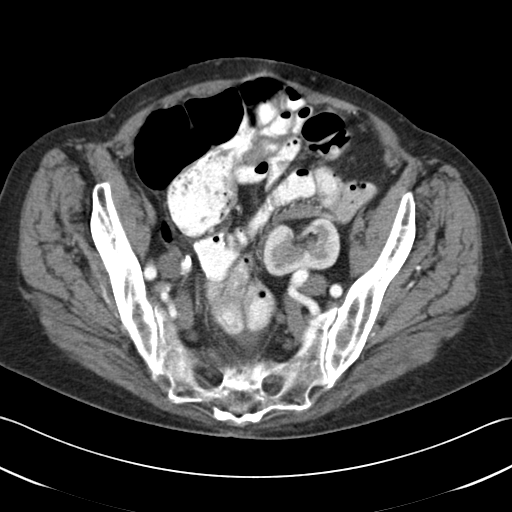
[im 38/86  soft-tissue]
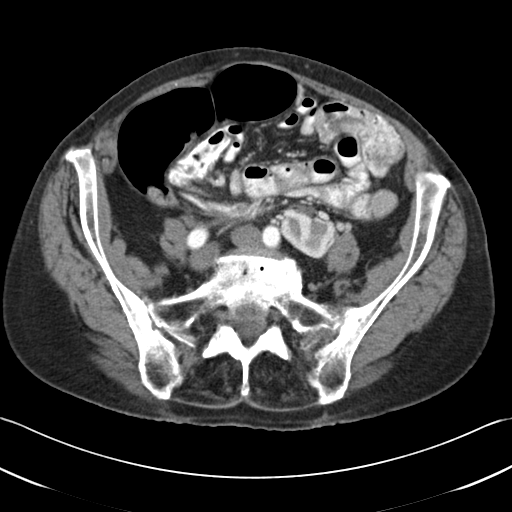
[im 43/86  soft-tissue]
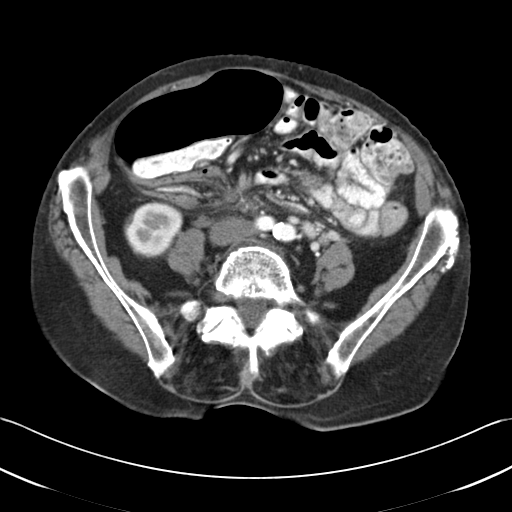
[im 48/86  soft-tissue]
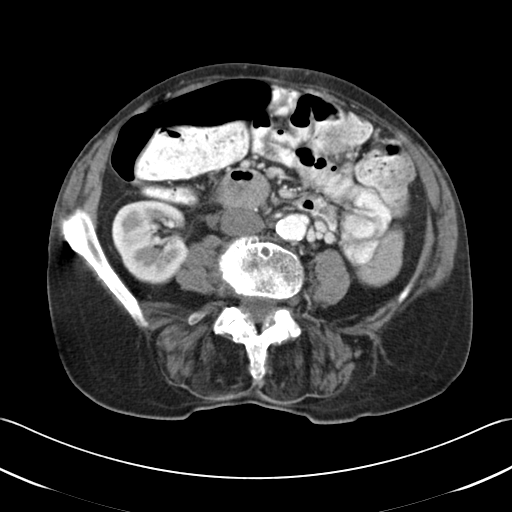
[im 57/86  soft-tissue]
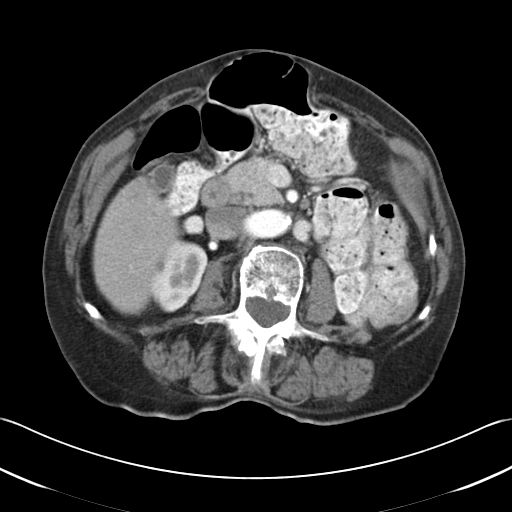
[im 57/86  bone]
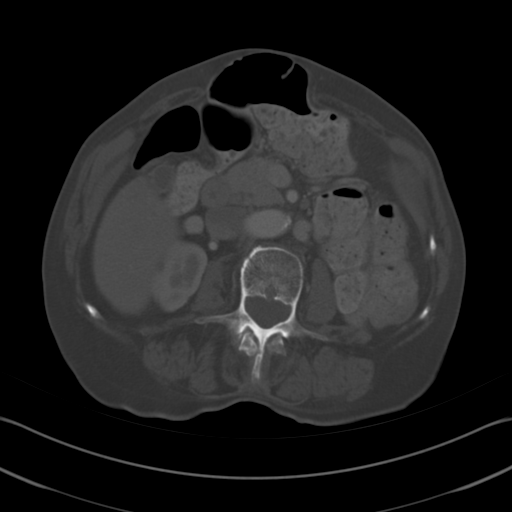
[im 62/86  soft-tissue]
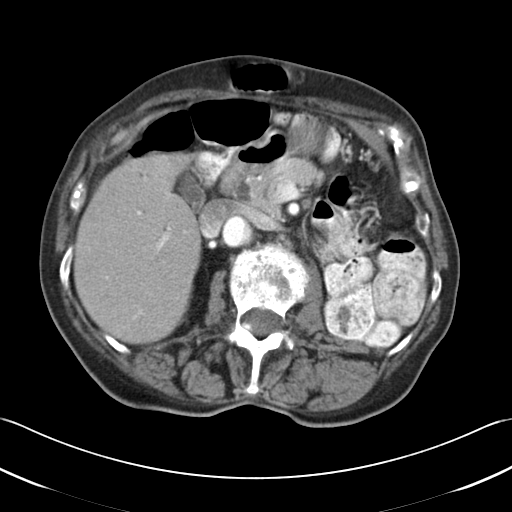
[im 67/86  soft-tissue]
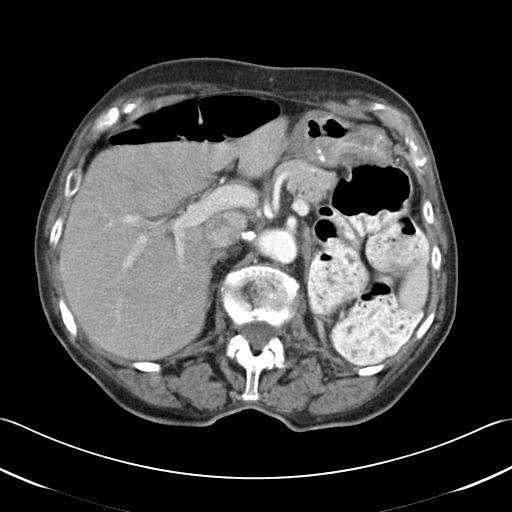
[im 76/86  soft-tissue]
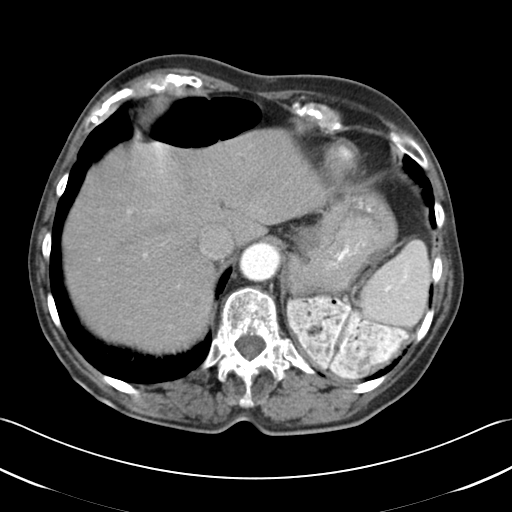
[im 81/86  soft-tissue]
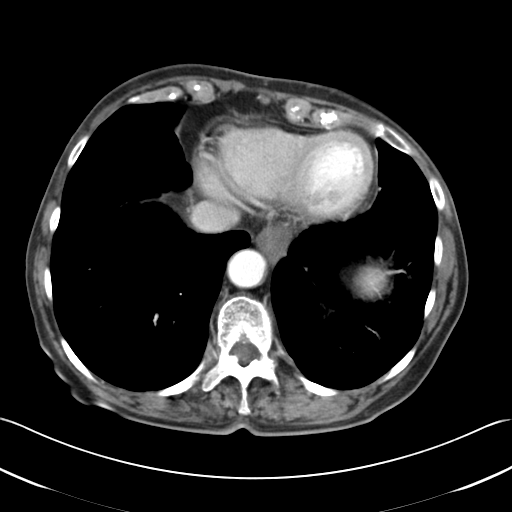

[Series 602: cor · coronal · 0.86mm/px · 3 of 112 slices shown]
[im 38/112  soft-tissue]
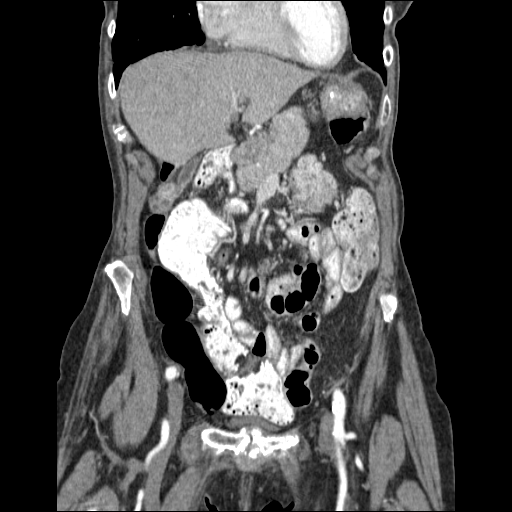
[im 50/112  soft-tissue]
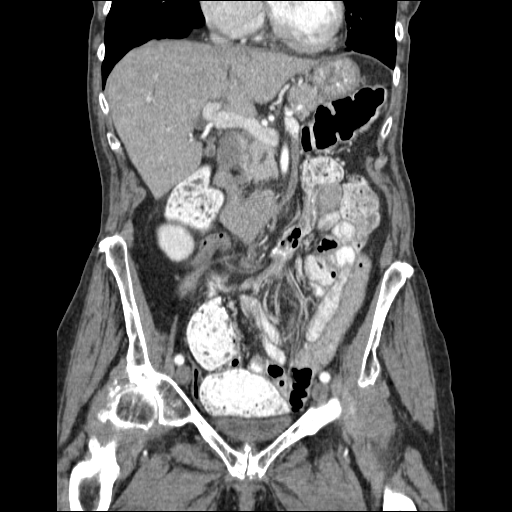
[im 62/112  soft-tissue]
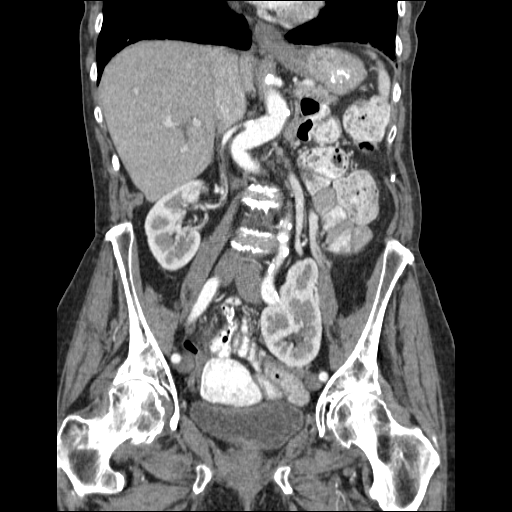

[16 of 46 positions shown; findings below may reference images not displayed]

FINDINGS: Lower chest: There are stable emphysematous changes with mild
scarring or atelectasis at both lung bases. There is no pleural or
pericardial effusion. A small hiatal hernia and distal esophageal
wall thickening are similar to the prior study.

Hepatobiliary: The liver is normal in density without focal
abnormality. The gallbladder is present as demonstrated on recent
ultrasound. Infolding and wall thickening at the gallbladder fundus
are unchanged, likely an incidental Phrygian cap. Extrahepatic
biliary dilatation to 10 mm appears unchanged.

Pancreas: The pancreas is stable in appearance. There is no evidence
of pancreatic mass, surrounding inflammation or pancreatic ductal
dilatation.

Spleen: Normal in size without focal abnormality.

Adrenals/Urinary Tract: Both adrenal glands appear normal. The right
kidney is normally positioned and appears normal without mass lesion
or hydronephrosis. The left kidney is positioned in the mid pelvis
and demonstrates chronic cortical thinning, but no hydronephrosis or
mass lesion. The bladder appears unremarkable.

Stomach/Bowel: No evidence of bowel wall thickening, distention or
surrounding inflammatory change. A supraumbilical hernia containing
a portion of the transverse colon is again noted. There is no
evidence of incarceration or obstruction. The colon is stool-filled
and redundant.

Vascular/Lymphatic: There are no enlarged abdominal or pelvic lymph
nodes. There is diffuse tortuosity of the aorta and iliac arteries
with moderate associated atherosclerosis.

Reproductive: Unremarkable.

Other: A small amount of free pelvic fluid is similar to the prior
examination.

Musculoskeletal: No acute or significant osseous findings. There are
stable degenerative changes throughout the lumbar spine, both hips
and the symphysis pubis. Chronic superior endplate compression
deformity at L1 and convex left scoliosis appear unchanged.
IMPRESSION: 1. No acute findings or explanation for the patient's symptoms
identified. The colon is stool filled and redundant. There is a
stable supraumbilical hernia containing a portion of the transverse
colon, but no evidence of incarceration or obstruction.
2. Nonspecific distal esophageal wall thickening, similar to prior
examination.
3. Stable mild extrahepatic biliary dilatation and probable Phrygian
cap.
4. Pelvic left kidney, aortoiliac atherosclerosis and degenerative
changes again noted.

## 2017-01-19 ENCOUNTER — Ambulatory Visit (INDEPENDENT_AMBULATORY_CARE_PROVIDER_SITE_OTHER): Payer: Medicare Other | Admitting: Internal Medicine

## 2017-01-19 ENCOUNTER — Encounter: Payer: Self-pay | Admitting: Internal Medicine

## 2017-01-19 VITALS — BP 124/72 | HR 103 | Temp 98.1°F | Resp 14 | Ht 70.0 in | Wt 149.1 lb

## 2017-01-19 DIAGNOSIS — F419 Anxiety disorder, unspecified: Secondary | ICD-10-CM | POA: Diagnosis not present

## 2017-01-19 DIAGNOSIS — Z23 Encounter for immunization: Secondary | ICD-10-CM

## 2017-01-19 DIAGNOSIS — F329 Major depressive disorder, single episode, unspecified: Secondary | ICD-10-CM | POA: Diagnosis not present

## 2017-01-19 MED ORDER — ALPRAZOLAM 0.5 MG PO TABS: 0.2500 mg | ORAL_TABLET | Freq: Two times a day (BID) | ORAL | 1 refills | Status: DC | PRN

## 2017-01-19 NOTE — Patient Instructions (Signed)
Continue the same medications  Also take half or 1 Xanax twice a day only as needed for anxiety

## 2017-01-19 NOTE — Progress Notes (Signed)
Pre visit review using our clinic review tool, if applicable. No additional management support is needed unless otherwise documented below in the visit note. 

## 2017-01-19 NOTE — Progress Notes (Signed)
Subjective:    Patient ID: [name], female    DOB: 01-25-1928, 83 y.o.   MRN: [id number]  DOS:  01/19/2017 Type of visit - description : rov Interval history: In general feeling well. COPD: Wonders how bad is her COPD,  she got an appointment to see a pulmonologist. On further questioning, she has minimal cough on and off and uses Symbicort essentially as needed with good control of symptoms. Emotionally doing well with no major problems with anxiety or depression. Noted to be mild anemia on lab review. Requested a UA recently, it came back negative for infection.   Review of Systems    Past Medical History:  Diagnosis Date  . Anxiety   . Arthritis   . Atrial fibrillation ([place])   . Cardiac arrhythmia due to congenital heart disease   . Chronic constipation   . COPD (chronic obstructive pulmonary disease) ([place])   . Depression   . GERD (gastroesophageal reflux disease)   . Hemorrhoid   . Hyperlipidemia   . Idiopathic hypotension   . Lightheadedness 11/2015  . Melanoma of skin ([place]) 12/06/2016  . Mycotic toenails 10/27/2012  . Parkinson disease ([place])   . Recurrent UTI   . 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 Varicose veins     Past Surgical History:  Procedure Laterality Date  . APPENDECTOMY  83 years old  . COLON RESECTION  2008  . melanoma removal Left    left cheek  . vitriectomy  08-2015    Social History   Social History  . Marital status: Widowed    Spouse name: N/A  . Number of children: 2  . Years of education: N/A   Occupational History  . retired- Ambulance person , elementary school [employer]  . Smoking status: Former Smoker    Packs/day: 2.00    Years: 30.00    Types: Cigarettes    Quit date: 04/21/1980  . Smokeless tobacco: Never Used     Comment: onset age 17 -47, up to > 1ppd (almost 2 ppd)  . Alcohol use No  . Drug use: No  . Sexual activity: No   Other Topics Concern  . Not on file   Social  History Narrative   Widowed, lives alone. 1 child in [place] and 1 in [place]. 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   Still drives as of 67-12      Allergies as of 01/19/2017      Reactions   Adhesive [tape] Other (See Comments)   Caused blisters   Amoxicillin Other (See Comments)   Has patient had a PCN reaction causing immediate rash, facial/tongue/throat swelling, SOB or lightheadedness with hypotension: Yes Has patient had a PCN reaction causing severe rash involving mucus membranes or skin necrosis: No Has patient had a PCN reaction that required hospitalization No Has patient had a PCN reaction occurring within the last 10 years: Yes If all of the above answers are "NO", then may proceed with Cephalosporin use. Caused thrush   Ciprofloxacin Other (See Comments)   Caused thrush   Ultram [tramadol] Other (See Comments)   hypotension      Medication List       Accurate as of 01/19/17 11:59 PM. Always use your most recent med list.          acetaminophen 325 MG tablet Commonly known as:  TYLENOL Take 2 tablets (650 mg total) by mouth every  6 (six) hours as needed for mild pain (or Fever >/= 101).   albuterol 108 (90 Base) MCG/ACT inhaler Commonly known as:  VENTOLIN HFA Inhale 2 puffs into the lungs 2 (two) times daily as needed for wheezing or shortness of breath.   ALPRAZolam 0.5 MG tablet Commonly known as:  XANAX Take 0.5-1 tablets (0.25-0.5 mg total) by mouth 2 (two) times daily as needed for anxiety.   apixaban 5 MG Tabs tablet Commonly known as:  ELIQUIS Take 5 mg by mouth 2 (two) times daily.   azelastine 0.1 % nasal spray Commonly known as:  ASTELIN Place 2 sprays into both nostrils at bedtime as needed for rhinitis. Use in each nostril as directed   budesonide-formoterol 160-4.5 MCG/ACT inhaler Commonly known as:  SYMBICORT Inhale 2 puffs into the lungs 2 (two) times daily.   cholecalciferol 1000 units tablet Commonly known as:  VITAMIN  D Take 1,000 Units by mouth daily.   diltiazem 30 MG tablet Commonly known as:  CARDIZEM Take 1 tablet (30 mg total) by mouth 4 (four) times daily.   DULoxetine 30 MG capsule Commonly known as:  CYMBALTA Take 1 capsule (30 mg total) by mouth daily.   ipratropium-albuterol 0.5-2.5 (3) MG/3ML Soln Commonly known as:  DUONEB Take 3 mLs by nebulization every 6 (six) hours as needed (Dyspnea/wheezing).   polyethylene glycol powder powder Commonly known as:  GLYCOLAX/MIRALAX Take 17 g by mouth 2 (two) times daily.   SYSTANE BALANCE OP Apply 2 drops to eye 2 (two) times daily.   vitamin C 1000 MG tablet Take 1,000 mg by mouth daily.          Objective:   Physical Exam  BP 124/72 (BP Location: Left Arm, Patient Position: Sitting, Cuff Size: Small)   Pulse (!) 103   Temp 98.1 F (36.7 C) (Oral)   Resp 14   Ht 5\' 10"  (1.778 m)   Wt 149 lb 2 oz (67.6 kg)   SpO2 97%   BMI 21.40 kg/m  General:   Well developed, well nourished . NAD.  HEENT:  Normocephalic . Face symmetric, atraumatic Skin: Not pale. Not jaundice Neurologic:  alert & oriented X3.  Speech normal, gait appropriate for age and unassisted Psych--  Cognition and judgment appear intact.  Cooperative with normal attention span and concentration.  Behavior appropriate. Seems to be at her baseline, no depression, no anxious but she is very talkative and somewhat hyperactive.      Assessment & Plan:   Assessment   A1c 5.7 01-2015 Hyperlipidemia Anxiety depression  Dr Ferdinand Lango (psychology), meds per pcp (see OV note from 02-19-16) Sx onset ~ 2007:  2012, 2013 was on citalopram. Then sertraline. Unable to tolerate Prozac on Wellbutrin 02-2015 (tired, dizzy). Trial with Cymbalta 01-2016. COPD (?. No previous PFTs) CV: P-Atrial fibrillation Dr Gerrit Friends, flecanaide prn /cardiazem prn. Echo 11-2015 @ [facility] Low BP- likely d/t parkinson, rx fludrocortisone ~ 11-2015 , d/c 08-2016 GI: --GERD --Abnormal abdominal  US mild pancreatic duct enlargement  --IBS used to be constipated, now diarrhea predominant --dysphagia MBE 02-22-15 wnl DJD--- take Celebrex very seldom Recurrent UTIs -- qd abx prn  , has seen urology Dr Edwena Blow before NEURO: ---Parkinsonism ---  Dr Tat ---Gait d/o (multifactorial likely) ---Peripheral neuropathy (pr neuro note 04-2015) ---mild cognitive impairmentairment (see neuro note 04-2015), dx confirmed by neuropsychiatry evaluation 07-2015 ---MBE 02/22/2015 normal Melanoma, dx 2018 admitted  01-2015: COPD and RVR  PLAN   Anxiety, depression: No depression at this point, her  daughter thinks she is anxious, patient herself stated that she is doing well and gets anxious only from time to time. We talk about options: No change her medicine, increase Cymbalta dose, add very low-dose of Xanax. She agreed with Xanax, she is feeling well the majority of the time. Prescription for Xanax 0.5 mg half or 1 tablet bid prn provided,  drowsiness and excessive sleep discussed. She also does yoga and keep busy with bible classes which is great. She sees a therapist. Flu shot today. RTC as scheduled December

## 2017-01-21 NOTE — Assessment & Plan Note (Signed)
Anxiety, depression: No depression at this point, her daughter thinks she is anxious, patient herself stated that she is doing well and gets anxious only from time to time. We talk about options: No change her medicine, increase Cymbalta dose, add very low-dose of Xanax. She agreed with Xanax, she is feeling well the majority of the time. Prescription for Xanax 0.5 mg half or 1 tablet bid prn provided,  drowsiness and excessive sleep discussed. She also does yoga and keep busy with bible classes which is great. She sees a therapist. Flu shot today. RTC as scheduled December

## 2017-02-03 ENCOUNTER — Ambulatory Visit (INDEPENDENT_AMBULATORY_CARE_PROVIDER_SITE_OTHER): Payer: Medicare Other | Admitting: Psychiatry

## 2017-02-03 DIAGNOSIS — F411 Generalized anxiety disorder: Secondary | ICD-10-CM | POA: Diagnosis not present

## 2017-02-09 ENCOUNTER — Other Ambulatory Visit: Payer: Self-pay | Admitting: Adult Health

## 2017-02-09 ENCOUNTER — Ambulatory Visit (HOSPITAL_BASED_OUTPATIENT_CLINIC_OR_DEPARTMENT_OTHER)
Admission: RE | Admit: 2017-02-09 | Discharge: 2017-02-09 | Disposition: A | Discharge status: Home / Self Care | Payer: Medicare Other | Source: Ambulatory Visit | Attending: Medical | Admitting: Medical

## 2017-02-09 ENCOUNTER — Other Ambulatory Visit: Payer: Self-pay | Admitting: Internal Medicine

## 2017-02-09 ENCOUNTER — Encounter: Payer: Self-pay | Admitting: Medical

## 2017-02-09 ENCOUNTER — Ambulatory Visit (INDEPENDENT_AMBULATORY_CARE_PROVIDER_SITE_OTHER): Payer: Medicare Other | Admitting: Medical

## 2017-02-09 VITALS — BP 118/86 | HR 95 | Temp 97.6°F | Resp 16 | Ht 70.0 in | Wt 154.2 lb

## 2017-02-09 DIAGNOSIS — T148XXA Other injury of unspecified body region, initial encounter: Secondary | ICD-10-CM

## 2017-02-09 DIAGNOSIS — K5900 Constipation, unspecified: Secondary | ICD-10-CM

## 2017-02-09 DIAGNOSIS — R42 Dizziness and giddiness: Secondary | ICD-10-CM

## 2017-02-09 DIAGNOSIS — R2689 Other abnormalities of gait and mobility: Secondary | ICD-10-CM

## 2017-02-09 DIAGNOSIS — R109 Unspecified abdominal pain: Secondary | ICD-10-CM | POA: Diagnosis not present

## 2017-02-09 DIAGNOSIS — M47819 Spondylosis without myelopathy or radiculopathy, site unspecified: Secondary | ICD-10-CM | POA: Insufficient documentation

## 2017-02-09 DIAGNOSIS — M47898 Other spondylosis, sacral and sacrococcygeal region: Secondary | ICD-10-CM | POA: Insufficient documentation

## 2017-02-09 DIAGNOSIS — M160 Bilateral primary osteoarthritis of hip: Secondary | ICD-10-CM | POA: Insufficient documentation

## 2017-02-09 LAB — CBC WITH DIFFERENTIAL/PLATELET
BASOS ABS: 0 10*3/uL (ref 0.0–0.1)
Basophils Relative: 0.7 % (ref 0.0–3.0)
Eosinophils Absolute: 0.1 10*3/uL (ref 0.0–0.7)
Eosinophils Relative: 1.3 % (ref 0.0–5.0)
HEMATOCRIT: 37.1 % (ref 36.0–46.0)
HEMOGLOBIN: 12.2 g/dL (ref 12.0–15.0)
LYMPHS PCT: 23.6 % (ref 12.0–46.0)
Lymphs Abs: 1.6 10*3/uL (ref 0.7–4.0)
MCHC: 32.9 g/dL (ref 30.0–36.0)
MCV: 98.1 fl (ref 78.0–100.0)
MONOS PCT: 8.7 % (ref 3.0–12.0)
Monocytes Absolute: 0.6 10*3/uL (ref 0.1–1.0)
NEUTROS ABS: 4.4 10*3/uL (ref 1.4–7.7)
Neutrophils Relative %: 65.7 % (ref 43.0–77.0)
PLATELETS: 209 10*3/uL (ref 150.0–400.0)
RBC: 3.79 Mil/uL — AB (ref 3.87–5.11)
RDW: 14.5 % (ref 11.5–15.5)
WBC: 6.7 10*3/uL (ref 4.0–10.5)

## 2017-02-09 NOTE — Patient Instructions (Signed)
For poor balance and history of falls. I want you to consider using your walker when possible. I filled out form explaining why pulling out trash can would be too risky.  For large bruise recently, I want to get cbc to assess your blood volume and platelets.  I want to recheck area on Friday at 8:15 am. Ask for this appointment slot. It is very rare but I have referred to specialist for severe/large hematomas that did not resolve/improve.  For constipation please get xray of abdomen today. Will let you know results of xray. If not indication of obstruction will likely recommend mag citrate otc.  Follow up this Friday or as needed

## 2017-02-09 NOTE — Progress Notes (Signed)
Subjective:    Patient ID: [name], female    DOB: 17-Dec-1927, 83 y.o.   MRN: [id number]  HPI   Pt in states she has some unsteadiness for few weeks to one month. Pt uses a cane. She in past has used meclizine intermittently in the past. She states that in past medication never made her drowsy.  Pt states on January 28, 2017 she fell. She was in hurry and fell in her bedroom. Pt has a walker at home. Pt has also a chair lift. Pt states bruised rt buttox not decreasing in size per pt. When fell forward she thinks rt buttox hit chair. No loc or head trauma on fall.  States she always is in a hurry. She feels like she is high strung and and anxious. Dr. Larose Kells rx'd xanax for her but she has not filled it yet.  Pt has life alert.  She has a two story house. She wants to try retirement home but does not have funds.  Pt has someone that helps her with first choice. One time a week. They help her shop.  Pt did have home health and occupational therapist come out.  Pt has history of constipation. Recently tried miralax. She will go very tiny amounts. Small and hard stools. No large stools passed. No obstuction. Trouble passing bm for 3 days.   Review of Systems  Constitutional: Negative for chills, fatigue and fever.  Respiratory: Negative for cough, chest tightness, shortness of breath and wheezing.   Cardiovascular: Negative for chest pain and palpitations.  Gastrointestinal: Negative for abdominal pain, blood in stool, constipation and diarrhea.  Genitourinary: Negative for dysuria, enuresis and flank pain.  Musculoskeletal: Negative for back pain.       States only fell one time in last year.  Rt buttock pain.  Skin: Negative for rash.  Neurological: Negative for dizziness, syncope, speech difficulty, weakness, light-headedness and headaches.  Hematological: Negative for adenopathy. Does not bruise/bleed easily.  Psychiatric/Behavioral: Negative for behavioral problems,  confusion, dysphoric mood, sleep disturbance and suicidal ideas.    Past Medical History:  Diagnosis Date  . Anxiety   . Arthritis   . Atrial fibrillation ([place])   . Cardiac arrhythmia due to congenital heart disease   . Chronic constipation   . COPD (chronic obstructive pulmonary disease) ([place])   . Depression   . GERD (gastroesophageal reflux disease)   . Hemorrhoid   . Hyperlipidemia   . Idiopathic hypotension   . Lightheadedness 11/2015  . Melanoma of skin ([place]) 12/06/2016  . Mycotic toenails 10/27/2012  . Parkinson disease ([place])   . Recurrent UTI   . 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 Varicose veins      Social History   Social History  . Marital status: Widowed    Spouse name: N/A  . Number of children: 2  . Years of education: N/A   Occupational History  . retired- Ambulance person , elementary school [employer]  . Smoking status: Former Smoker    Packs/day: 2.00    Years: 30.00    Types: Cigarettes    Quit date: 04/21/1980  . Smokeless tobacco: Never Used     Comment: onset age 89 -57, up to > 1ppd (almost 2 ppd)  . Alcohol use No  . Drug use: No  . Sexual activity: No   Other Topics Concern  . Not on file   Social History Narrative   Widowed, lives  alone. 1 child in [place] and 1 in [place]. 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 APPENDECTOMY  83 years old  . COLON RESECTION  2008  . melanoma removal Left    left cheek  . vitriectomy  08-2015    Family History  Problem Relation Age of Onset  . Asthma Brother   . Alcohol abuse Brother   . Throat cancer Father   . Alcohol abuse Father   . Lupus Daughter   . Bipolar disorder Daughter   . Lupus Daughter   . Anxiety disorder Daughter   . Alcohol abuse Sister   . Alcohol abuse Maternal Grandfather   . Alcohol abuse Paternal Grandmother   . Breast cancer Sister   . Colon cancer  Neg Hx     Allergies  Allergen Reactions  . Adhesive [Tape] Other (See Comments)    Caused blisters  . Amoxicillin Other (See Comments)    Has patient had a PCN reaction causing immediate rash, facial/tongue/throat swelling, SOB or lightheadedness with hypotension: Yes Has patient had a PCN reaction causing severe rash involving mucus membranes or skin necrosis: No Has patient had a PCN reaction that required hospitalization No Has patient had a PCN reaction occurring within the last 10 years: Yes If all of the above answers are "NO", then may proceed with Cephalosporin use.   Caused thrush  . Ciprofloxacin Other (See Comments)    Caused thrush  . Ultram [Tramadol] Other (See Comments)    hypotension    Current Outpatient Prescriptions on File Prior to Visit  Medication Sig Dispense Refill  . acetaminophen (TYLENOL) 325 MG tablet Take 2 tablets (650 mg total) by mouth every 6 (six) hours as needed for mild pain (or Fever >/= 101).    Marland Kitchen albuterol (VENTOLIN HFA) 108 (90 BASE) MCG/ACT inhaler Inhale 2 puffs into the lungs 2 (two) times daily as needed for wheezing or shortness of breath. 18 g 4  . ALPRAZolam (XANAX) 0.5 MG tablet Take 0.5-1 tablets (0.25-0.5 mg total) by mouth 2 (two) times daily as needed for anxiety. 30 tablet 1  . apixaban (ELIQUIS) 5 MG TABS tablet Take 5 mg by mouth 2 (two) times daily.    . Ascorbic Acid (VITAMIN C) 1000 MG tablet Take 1,000 mg by mouth daily.    Marland Kitchen azelastine (ASTELIN) 0.1 % nasal spray Place 2 sprays into both nostrils at bedtime as needed for rhinitis. Use in each nostril as directed 30 mL 3  . budesonide-formoterol (SYMBICORT) 160-4.5 MCG/ACT inhaler Inhale 2 puffs into the lungs 2 (two) times daily. 30.6 g 1  . cholecalciferol (VITAMIN D) 1000 units tablet Take 1,000 Units by mouth daily.    Marland Kitchen diltiazem (CARDIZEM) 30 MG tablet Take 1 tablet (30 mg total) by mouth 4 (four) times daily. (Patient taking differently: Take 30 mg by mouth 3 (three)  times daily. ) 120 tablet 3  . DULoxetine (CYMBALTA) 30 MG capsule Take 1 capsule (30 mg total) by mouth daily. 30 capsule 3  . ipratropium-albuterol (DUONEB) 0.5-2.5 (3) MG/3ML SOLN Take 3 mLs by nebulization every 6 (six) hours as needed (Dyspnea/wheezing).    . polyethylene glycol powder (GLYCOLAX/MIRALAX) powder Take 17 g by mouth 2 (two) times daily. 255 g 0  . Propylene Glycol (SYSTANE BALANCE OP) Apply 2 drops to eye 2 (two) times daily.  No current facility-administered medications on file prior to visit.     BP 118/86   Pulse 95   Temp 97.6 F (36.4 C) (Oral)   Resp 16   Ht 5\' 10"  (1.778 m)   Wt 154 lb 3.2 oz (69.9 kg)   SpO2 98%   BMI 22.13 kg/m       Objective:   Physical Exam  General Mental Status- Alert. General Appearance- Not in acute distress.   Skin General: Color- Normal Color. Moisture- Normal Moisture.  Neck Carotid Arteries- Normal color. Moisture- Normal Moisture. No carotid bruits. No JVD.  Chest and Lung Exam Auscultation: Breath Sounds:-Normal.  Cardiovascular Auscultation:Rythm- Regular. Murmurs & Other Heart Sounds:Auscultation of the heart reveals- No Murmurs.  Abdomen Inspection:-Inspeection Normal. Palpation/Percussion:Note:No mass. Palpation and Percussion of the abdomen reveal- mild distended fell, but non-Tender. + BS, no rebound or guarding.  Neurologic Cranial Nerve exam:- CN III-XII intact(No nystagmus), symmetric smile. Strength:- 5/5 equal and symmetric strength both upper and lower extremities.  Rt buttox- large bruise about 10-12 cm x 8-9 cm. The area is large and indurated.(no warmth)  Rt hip- no direct pain on palpation of pt rt hip. Or on rom.    Assessment & Plan:  For poor balance and history of falls. I want you to consider using your walker when possible. I filled out form explaining why pulling out trash can would be too risky.  For large bruise recently, I want to get cbc to assess your blood volume and  platelets.  I want to recheck area on Friday at 8:15 am. Ask for this appointment slot. It is very rare but I have referred to specialist for severe/large hematomas that did not resolve/improve.  For constipation please get xray of abdomen today. Will let you know results of xray. If not indication of obstruction will likely recommend mag citrate otc.  Follow up this Friday or as needed  Jamison Soward, [place], [place]

## 2017-02-10 ENCOUNTER — Other Ambulatory Visit: Payer: Self-pay | Admitting: Adult Health

## 2017-02-10 DIAGNOSIS — L821 Other seborrheic keratosis: Secondary | ICD-10-CM | POA: Diagnosis not present

## 2017-02-10 DIAGNOSIS — D229 Melanocytic nevi, unspecified: Secondary | ICD-10-CM | POA: Diagnosis not present

## 2017-02-10 DIAGNOSIS — Z85820 Personal history of malignant melanoma of skin: Secondary | ICD-10-CM | POA: Diagnosis not present

## 2017-02-10 DIAGNOSIS — L720 Epidermal cyst: Secondary | ICD-10-CM | POA: Diagnosis not present

## 2017-02-13 ENCOUNTER — Encounter: Payer: Self-pay | Admitting: Medical

## 2017-02-13 ENCOUNTER — Ambulatory Visit (INDEPENDENT_AMBULATORY_CARE_PROVIDER_SITE_OTHER): Payer: Medicare Other | Admitting: Medical

## 2017-02-13 VITALS — BP 109/62 | HR 95 | Resp 16 | Ht 70.0 in | Wt 155.8 lb

## 2017-02-13 DIAGNOSIS — T148XXA Other injury of unspecified body region, initial encounter: Secondary | ICD-10-CM

## 2017-02-13 DIAGNOSIS — K5900 Constipation, unspecified: Secondary | ICD-10-CM | POA: Diagnosis not present

## 2017-02-13 NOTE — Progress Notes (Signed)
Subjective:    Patient ID: [name], female    DOB: November 22, 1927, 83 y.o.   MRN: [id number]  HPI  Pt states she did have large bowel movement after the magnesium citrate.See last visit. Xray confirmed a lot of stool I called her day of visit. Advised take magnesium citrate and next morning described had large bm.  Pt in for follow up on large bruise rt buttock to make sure decrease in size and less induration. Less pain.    Review of Systems  Constitutional: Negative for chills, fatigue and fever.  Respiratory: Negative for chest tightness, shortness of breath and wheezing.   Cardiovascular: Negative for chest pain and palpitations.  Gastrointestinal: Negative for abdominal pain, constipation and vomiting.       Resolved.  Musculoskeletal: Negative for back pain.  Skin: Negative for rash.  Neurological: Negative for dizziness and headaches.  Hematological: Negative for adenopathy. Does not bruise/bleed easily.  Psychiatric/Behavioral: Negative for behavioral problems, confusion and dysphoric mood.    Past Medical History:  Diagnosis Date  . Anxiety   . Arthritis   . Atrial fibrillation ([place])   . Cardiac arrhythmia due to congenital heart disease   . Chronic constipation   . COPD (chronic obstructive pulmonary disease) ([place])   . Depression   . GERD (gastroesophageal reflux disease)   . Hemorrhoid   . Hyperlipidemia   . Idiopathic hypotension   . Lightheadedness 11/2015  . Melanoma of skin ([place]) 12/06/2016  . Mycotic toenails 10/27/2012  . Parkinson disease ([place])   . Recurrent UTI   . 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 Varicose veins      Social History   Social History  . Marital status: Widowed    Spouse name: N/A  . Number of children: 2  . Years of education: N/A   Occupational History  . retired- Ambulance person , elementary school [employer]  . Smoking status: Former Smoker    Packs/day: 2.00    Years: 30.00   Types: Cigarettes    Quit date: 04/21/1980  . Smokeless tobacco: Never Used     Comment: onset age 60 -43, up to > 1ppd (almost 2 ppd)  . Alcohol use No  . Drug use: No  . Sexual activity: No   Other Topics Concern  . Not on file   Social History Narrative   Widowed, lives alone. 1 child in [place] and 1 in [place]. 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   Still drives as of 21-30    Past Surgical History:  Procedure Laterality Date  . APPENDECTOMY  83 years old  . COLON RESECTION  2008  . melanoma removal Left    left cheek  . vitriectomy  08-2015    Family History  Problem Relation Age of Onset  . Asthma Brother   . Alcohol abuse Brother   . Throat cancer Father   . Alcohol abuse Father   . Lupus Daughter   . Bipolar disorder Daughter   . Lupus Daughter   . Anxiety disorder Daughter   . Alcohol abuse Sister   . Alcohol abuse Maternal Grandfather   . Alcohol abuse Paternal Grandmother   . Breast cancer Sister   . Colon cancer Neg Hx     Allergies  Allergen Reactions  . Adhesive [Tape] Other (See Comments)    Caused blisters  . Amoxicillin Other (See Comments)    Has patient  had a PCN reaction causing immediate rash, facial/tongue/throat swelling, SOB or lightheadedness with hypotension: Yes Has patient had a PCN reaction causing severe rash involving mucus membranes or skin necrosis: No Has patient had a PCN reaction that required hospitalization No Has patient had a PCN reaction occurring within the last 10 years: Yes If all of the above answers are "NO", then may proceed with Cephalosporin use.   Caused thrush  . Ciprofloxacin Other (See Comments)    Caused thrush  . Ultram [Tramadol] Other (See Comments)    hypotension    Current Outpatient Prescriptions on File Prior to Visit  Medication Sig Dispense Refill  . acetaminophen (TYLENOL) 325 MG tablet Take 2 tablets (650 mg total) by mouth every 6 (six) hours as needed for mild pain (or Fever >/=  101).    Marland Kitchen albuterol (VENTOLIN HFA) 108 (90 BASE) MCG/ACT inhaler Inhale 2 puffs into the lungs 2 (two) times daily as needed for wheezing or shortness of breath. 18 g 4  . ALPRAZolam (XANAX) 0.5 MG tablet Take 0.5-1 tablets (0.25-0.5 mg total) by mouth 2 (two) times daily as needed for anxiety. 30 tablet 1  . apixaban (ELIQUIS) 5 MG TABS tablet Take 5 mg by mouth 2 (two) times daily.    . Ascorbic Acid (VITAMIN C) 1000 MG tablet Take 1,000 mg by mouth daily.    Marland Kitchen azelastine (ASTELIN) 0.1 % nasal spray Place 2 sprays into both nostrils at bedtime as needed for rhinitis. Use in each nostril as directed 30 mL 3  . budesonide-formoterol (SYMBICORT) 160-4.5 MCG/ACT inhaler Inhale 2 puffs into the lungs 2 (two) times daily. 30.6 g 1  . cholecalciferol (VITAMIN D) 1000 units tablet Take 1,000 Units by mouth daily.    Marland Kitchen diltiazem (CARDIZEM) 30 MG tablet Take 1 tablet (30 mg total) by mouth 4 (four) times daily. (Patient taking differently: Take 30 mg by mouth 3 (three) times daily. ) 120 tablet 3  . DULoxetine (CYMBALTA) 30 MG capsule Take 1 capsule (30 mg total) by mouth daily. 30 capsule 3  . ipratropium-albuterol (DUONEB) 0.5-2.5 (3) MG/3ML SOLN Take 3 mLs by nebulization every 6 (six) hours as needed (Dyspnea/wheezing).    . polyethylene glycol powder (GLYCOLAX/MIRALAX) powder Take 17 g by mouth 2 (two) times daily. 255 g 0  . Propylene Glycol (SYSTANE BALANCE OP) Apply 2 drops to eye 2 (two) times daily.      No current facility-administered medications on file prior to visit.     BP 109/62   Pulse 95   Resp 16   Ht 5\' 10"  (1.778 m)   Wt 155 lb 12.8 oz (70.7 kg)   SpO2 97%   BMI 22.35 kg/m       Objective:   Physical Exam  General Mental Status- Alert. General Appearance- Not in acute distress.   Skin General: Color- Normal Color. Moisture- Normal Moisture.  Neck Carotid Arteries- Normal color. Moisture- Normal Moisture. No carotid bruits. No JVD.  Chest and Lung  Exam Auscultation: Breath Sounds:-Normal.  Cardiovascular Auscultation:Rythm- Regular. Murmurs & Other Heart Sounds:Auscultation of the heart reveals- No Murmurs.  Neurologic Cranial Nerve exam:- CN III-XII intact(No nystagmus), symmetric smile. Strength:- 5/5 equal and symmetric strength both upper and lower extremities.  Rt buttox- large bruise about 10-12 cm x 8-9 cm. Less indurated and less bruised. Some appears to be absorbing now  Rt hip- no direct pain on palpation of pt rt hip. Or on rom.      Assessment & Plan:  Your bruise does look improved. I do think it will continue to heal very slowly. Try to limit gym (seated exercises) for next 10 days.  Keep well hydrated to avoid constipation. Can use dulcolax or miralax every other day. If no bm at time exceeding 3 days could occasional resort to magnesium citrate.  Follow 2 months or as needed  Angelette Ganus, Percell Miller, PA-C

## 2017-02-13 NOTE — Patient Instructions (Addendum)
Your bruise does look improved. I do think it will continue to heal very slowly. Try to limit gym (seated exercises) for next 10 days. I don't think specialist referral needed.  Keep well hydrated to avoid constipation. Can use dulcolax or miralax every other day. If no bm at time exceeding 3 days could occasional resort to magnesium citrate.  Follow 2 months or as needed

## 2017-02-19 ENCOUNTER — Ambulatory Visit (INDEPENDENT_AMBULATORY_CARE_PROVIDER_SITE_OTHER): Payer: Medicare Other | Admitting: Pulmonary Disease

## 2017-02-19 ENCOUNTER — Other Ambulatory Visit: Payer: Medicare Other

## 2017-02-19 ENCOUNTER — Encounter: Payer: Self-pay | Admitting: Pulmonary Disease

## 2017-02-19 VITALS — BP 118/70 | HR 70 | Ht 71.0 in | Wt 151.0 lb

## 2017-02-19 DIAGNOSIS — J449 Chronic obstructive pulmonary disease, unspecified: Secondary | ICD-10-CM

## 2017-02-19 LAB — PULMONARY FUNCTION TEST
DL/VA % pred: 66 %
DL/VA: 3.66 ml/min/mmHg/L
DLCO COR: 17.38 ml/min/mmHg
DLCO UNC % PRED: 49 %
DLCO UNC: 16.64 ml/min/mmHg
DLCO cor % pred: 51 %
FEF 25-75 PRE: 1.09 L/s
FEF 25-75 Post: 1.73 L/sec
FEF2575-%CHANGE-POST: 58 %
FEF2575-%PRED-PRE: 74 %
FEF2575-%Pred-Post: 118 %
FEV1-%CHANGE-POST: 10 %
FEV1-%PRED-PRE: 76 %
FEV1-%Pred-Post: 83 %
FEV1-POST: 2 L
FEV1-Pre: 1.82 L
FEV1FVC-%CHANGE-POST: 8 %
FEV1FVC-%Pred-Pre: 99 %
FEV6-%CHANGE-POST: 1 %
FEV6-%PRED-PRE: 84 %
FEV6-%Pred-Post: 85 %
FEV6-PRE: 2.54 L
FEV6-Post: 2.58 L
FEV6FVC-%CHANGE-POST: 0 %
FEV6FVC-%PRED-POST: 104 %
FEV6FVC-%Pred-Pre: 104 %
FVC-%Change-Post: 1 %
FVC-%Pred-Post: 81 %
FVC-%Pred-Pre: 79 %
FVC-Post: 2.6 L
FVC-Pre: 2.55 L
POST FEV6/FVC RATIO: 99 %
PRE FEV1/FVC RATIO: 71 %
Post FEV1/FVC ratio: 77 %
Pre FEV6/FVC Ratio: 100 %
RV % pred: 147 %
RV: 4.26 L
TLC % PRED: 109 %
TLC: 6.65 L

## 2017-02-19 LAB — NITRIC OXIDE: Nitric Oxide: 20

## 2017-02-19 MED ORDER — FLUTICASONE-UMECLIDIN-VILANT 100-62.5-25 MCG/INH IN AEPB: 1.0000 | INHALATION_SPRAY | Freq: Every day | RESPIRATORY_TRACT | 0 refills | Status: AC

## 2017-02-19 NOTE — Progress Notes (Addendum)
[name]    [id number]    Dec 13, 1927  Primary Care Physician:Paz, Alda Berthold, MD  Referring Physician: Colon Branch, MD [address] [place], [place] [address]  Chief complaint:   Follow-up for COPD GOLD B  HPI: [name] is a 83 year old with COPD GOLD B (CAT score 25, FEV1 76% on 2013), paroxysmal atrial fibrillation on Eliquis, Parkinsons.  She is just maintained on Symbicort.  Reports that over the past few months she has noticed increasing dyspnea with exertion.  She is not able to keep up with her exercise regimen. Has increasing unsteadiness on the feet with dizziness.  She had a fall last month when she was hurrying at home and developed a bruise over her right gluteal region.  There is no head trauma or loss of consciousness. She had an hospitalization in May 2018 for PNA Vs bronchitis.  Pets: Cats.  Occupation: Retired Licensed conveyancer Exposures: None Smoking history: 60-pack-year smoking history. Quit in 1982. No alcohol, drug use. She is widowed and lives alone.  Outpatient Encounter Prescriptions as of 02/19/2017  Medication Sig  . acetaminophen (TYLENOL) 325 MG tablet Take 2 tablets (650 mg total) by mouth every 6 (six) hours as needed for mild pain (or Fever >/= 101).  Marland Kitchen albuterol (VENTOLIN HFA) 108 (90 BASE) MCG/ACT inhaler Inhale 2 puffs into the lungs 2 (two) times daily as needed for wheezing or shortness of breath.  . ALPRAZolam (XANAX) 0.5 MG tablet Take 0.5-1 tablets (0.25-0.5 mg total) by mouth 2 (two) times daily as needed for anxiety.  Marland Kitchen apixaban (ELIQUIS) 5 MG TABS tablet Take 5 mg by mouth 2 (two) times daily.  . Ascorbic Acid (VITAMIN C) 1000 MG tablet Take 1,000 mg by mouth daily.  Marland Kitchen azelastine (ASTELIN) 0.1 % nasal spray Place 2 sprays into both nostrils at bedtime as needed for rhinitis. Use in each nostril as directed  . budesonide-formoterol (SYMBICORT) 160-4.5 MCG/ACT inhaler Inhale 2 puffs into the lungs 2 (two) times daily.   . cholecalciferol (VITAMIN D) 1000 units tablet Take 1,000 Units by mouth daily.  Marland Kitchen diltiazem (CARDIZEM) 30 MG tablet Take 1 tablet (30 mg total) by mouth 4 (four) times daily. (Patient taking differently: Take 30 mg by mouth 3 (three) times daily. )  . DULoxetine (CYMBALTA) 30 MG capsule Take 1 capsule (30 mg total) by mouth daily.  Marland Kitchen ipratropium-albuterol (DUONEB) 0.5-2.5 (3) MG/3ML SOLN Take 3 mLs by nebulization every 6 (six) hours as needed (Dyspnea/wheezing).  . polyethylene glycol powder (GLYCOLAX/MIRALAX) powder Take 17 g by mouth 2 (two) times daily.  Marland Kitchen Propylene Glycol (SYSTANE BALANCE OP) Apply 2 drops to eye 2 (two) times daily.    No facility-administered encounter medications on file as of 02/19/2017.     Allergies as of 02/19/2017 - Review Complete 02/19/2017  Allergen Reaction Noted  . Adhesive [tape] Other (See Comments) 03/26/2011  . Amoxicillin Other (See Comments)   . Ciprofloxacin Other (See Comments)   . Ultram [tramadol] Other (See Comments) 04/02/2012    Past Medical History:  Diagnosis Date  . Anxiety   . Arthritis   . Atrial fibrillation ([place])   . Cardiac arrhythmia due to congenital heart disease   . Chronic constipation   . COPD (chronic obstructive pulmonary disease) ([place])   . Depression   . GERD (gastroesophageal reflux disease)   . Hemorrhoid   . Hyperlipidemia   . Idiopathic hypotension   . Lightheadedness 11/2015  .  Melanoma of skin ([place]) 12/06/2016  . Mycotic toenails 10/27/2012  . Parkinson disease ([place])   . Recurrent UTI   . 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 Varicose veins     Past Surgical History:  Procedure Laterality Date  . APPENDECTOMY  83 years old  . COLON RESECTION  2008  . melanoma removal Left    left cheek  . vitriectomy  08-2015    Family History  Problem Relation Age of Onset  . Asthma Brother   . Alcohol abuse Brother   . Throat cancer Father   . Alcohol abuse Father   . Lupus Daughter   . Bipolar disorder  Daughter   . Lupus Daughter   . Anxiety disorder Daughter   . Alcohol abuse Sister   . Alcohol abuse Maternal Grandfather   . Alcohol abuse Paternal Grandmother   . Breast cancer Sister   . Colon cancer Neg Hx     Social History   Social History  . Marital status: Widowed    Spouse name: N/A  . Number of children: 2  . Years of education: N/A   Occupational History  . retired- Ambulance person , elementary school [employer]  . Smoking status: Former Smoker    Packs/day: 2.00    Years: 30.00    Types: Cigarettes    Quit date: 04/21/1980  . Smokeless tobacco: Never Used     Comment: onset age 89 -36, up to > 1ppd (almost 2 ppd)  . Alcohol use No  . Drug use: No  . Sexual activity: No   Other Topics Concern  . Not on file   Social History Narrative   Widowed, lives alone. 1 child in [place] and 1 in [place]. 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   Still drives as of 09-98    Review of systems: Review of Systems  Constitutional: Negative for fever and chills.  HENT: Negative.   Eyes: Negative for blurred vision.  Respiratory: as per HPI  Cardiovascular: Negative for chest pain and palpitations.  Gastrointestinal: Negative for vomiting, diarrhea, blood per rectum. Genitourinary: Negative for dysuria, urgency, frequency and hematuria.  Musculoskeletal: Negative for myalgias, back pain and joint pain.  Skin: Negative for itching and rash.  Neurological: Negative for dizziness, tremors, focal weakness, seizures and loss of consciousness.  Endo/Heme/Allergies: Negative for environmental allergies.  Psychiatric/Behavioral: Negative for depression, suicidal ideas and hallucinations.  All other systems reviewed and are negative.  Physical Exam: Blood pressure 104/66, pulse (!) 123, height 5\' 10"  (1.778 m), weight 67 kg (147 lb 9.6 oz), SpO2 96 %. Gen:      No acute distress HEENT:  EOMI, sclera anicteric Neck:     No masses; no  thyromegaly Lungs:    Clear to auscultation bilaterally; normal respiratory effort CV:         Regular rate and rhythm; no murmurs Abd:      + bowel sounds; soft, non-tender; no palpable masses, no distension Ext:    No edema; adequate peripheral perfusion Skin:      Warm and dry; no rash Neuro: alert and oriented x 3 Psych: normal mood and affect  Data Reviewed: PFTs 05/14/11 FVC 2.79 [79%), FEV1 1.91 [76%), F/F 69. Mild obstructive defect.  PFTs 02/19/17 FVC 2.60 [81%], FEV1 2.0 [83%], F/F 77, TLC 109%, RV/TLC 125%, DLCO 49% Minimal obstructive airway disease with hyperinflation, moderate-severe diffusion impairment  CXR 06/18/15 Bronchitis.  CXR 08/26/16 Hyperinflation, atelectasis at the bases. I have reviewed all images personally.  FENO 02/19/17- 20  Echocardiogram 03/05/15 The right ventricular systolic pressure was increased consistent with moderate pulmonary hypertension  CBC 91/79/15-[id number] eosinophilic count 016  Assessment:  COPD GOLD B PFTs today reviewed.  They show minimal obstructive airway disease with hyperinflation.  The Symbicort does not appear to be helping much with her symptoms and she still continues to have significant dyspnea.  Will try Trelegy inhaler instead of the symbicort.  There is no evidence of elevated eosinophilic count, however she does have history of asthma and has improvement in flow rates post albuterol so we will keep the inhaled steroids for now. Check blood allergy profile.  Worsening dyspnea Not entirely clear why she has worsening dyspnea.  PFTs show reduction in diffusion capacity out of proportion to her obstruction which may be from pulmonary vascular disease.  Echo noted in the past with moderate pulmonary hypertension.  I will check with Dr. Einar Gip, cardiology to see if she has more recent assessment of pulmonary hypertension. Get CXR to eval for any interstitial process. She will benefit from a supervised pulmonary rehab  program.  She did not desat on exertion today.  We discussed getting a sleep study to check for presence of sleep apnea or nocturnal hypoxia but she deferred this.  Will reevaluate at next visit.  Plan/Recommendations: - Change symbicort to trelegy - Check blood allergy profile, CXR - Discuss with Dr. Einar Gip regarding echo for pulmonary HTN - Pulmonary rehab.   Marshell Garfinkel MD [facility] Pager [phone] 02/19/2017, 11:27 AM  CC: Colon Branch, MD   Addendum: Received clinic note from Dr. Einar Gip dated 03/04/17 Echocardiogram 11/29/15 LV cavity is normal in size, borderline global hypokinesis with low normal LVEF.  Left atrial cavity is severely dilated.  Possible PFO with small left to right shunt. Right atrial cavity severely dilated Mild to moderate MR.  Mild to moderate TR, mild pulmonary hypertension.  Pulmonary artery systolic pressure is 32  Impression-symptoms likely combination of lung disease, deconditioning, Parkinson's, chronic diastolic heart failure from A. fib.  She will be started on Florinef for dizziness Repeat echocardiogram and reassess.

## 2017-02-19 NOTE — Patient Instructions (Addendum)
We will check of blood allergy profile today We will switch your inhaler to trelegy to see if this will work better for you than the symbicort.  We will refer you for cardiopulmonary rehabilitation program Please follow-up with your doctor to see if the Parkinson's is getting worse Follow-up in 3 months.

## 2017-02-19 NOTE — Patient Instructions (Signed)
PFT done today. 

## 2017-02-20 ENCOUNTER — Telehealth: Payer: Self-pay | Admitting: Pulmonary Disease

## 2017-02-20 ENCOUNTER — Telehealth (HOSPITAL_COMMUNITY): Payer: Self-pay

## 2017-02-20 LAB — RESPIRATORY ALLERGY PROFILE REGION II ~~LOC~~
ALLERGEN, COMM SILVER BIRCH, T3: 0.82 kU/L — AB
Allergen, Cedar tree, t12: <0.1 kU/L
Allergen, D pternoyssinus,d7: <0.1 kU/L
Allergen, Mouse Urine Protein, e78: 0.29 kU/L — ABNORMAL HIGH
Allergen, Mulberry, t76: <0.1 kU/L
Allergen, Oak,t7: 0.79 kU/L — ABNORMAL HIGH
Aspergillus fumigatus, m3: <0.1 kU/L
Bermuda Grass: 0.22 kU/L — ABNORMAL HIGH
CLASS: 0
CLASS: 0
CLASS: 0
CLASS: 0
CLASS: 0
CLASS: 0
CLASS: 0
CLASS: 0
CLASS: 0
CLASS: 0
CLASS: 2
COMMON RAGWEED (SHORT) (W1) IGE: 0.12 kU/L — AB
Cat Dander: 0.3 kU/L — ABNORMAL HIGH
Class: 0
Class: 0
Class: 0
Class: 0
Class: 0
Class: 0
Class: 0
Class: 0
Class: 0
Class: 0
Class: 0
Class: 2
Class: 2
Cockroach: <0.1 kU/L
DOG DANDER: 0.29 kU/L — AB
IGE (IMMUNOGLOBULIN E), SERUM: 31 kU/L (ref <=114)
JOHNSON GRASS: 0.18 kU/L — AB
Rough Pigweed  IgE: <0.1 kU/L
Sheep Sorrel IgE: <0.1 kU/L
Timothy Grass: 0.7 kU/L — ABNORMAL HIGH

## 2017-02-20 LAB — INTERPRETATION:

## 2017-02-20 NOTE — Telephone Encounter (Signed)
Attempted to call patient in regards to Pulmonary Rehab. LMTCB °

## 2017-02-20 NOTE — Telephone Encounter (Signed)
Left message for patient to call back  

## 2017-02-20 NOTE — Telephone Encounter (Signed)
Patients insurance is active and benefits verified with Medicare Part B and [employer] supplement. Medicare Part B covers 80% and BCBS covers 20%. Patient is covered at 100%.  Will contact patient to schedule for Pulmonary Rehab.

## 2017-02-23 NOTE — Telephone Encounter (Signed)
Spoke with patient. She stated that she was told by her insurance company that they would not cover the [place] due to her being over 59. Because of this, she wants to know if it is ok for her to start back on Symbicort 160. Advised patient that I would ask PM about this for her.   She is also requesting the results of her allergic profile. Advised patient that results were in her chart but that PM had not reviewed them yet. She verbalized understanding.   PM, please advise about the switching to Symbicort and allergy profile. Thanks!

## 2017-02-23 NOTE — Telephone Encounter (Signed)
Patient returned phone call in regards to Pulmonary Rehab - Patient is interested in program. Scheduled orientation on 03/06/17 at 1:30pm. Patient will be attending the 1:30pm class.

## 2017-02-23 NOTE — Telephone Encounter (Signed)
Pt returned phone call.  

## 2017-02-25 NOTE — Telephone Encounter (Signed)
Ok to restart the symbicort.  Marshell Garfinkel MD [facility] 02/25/2017, 5:30 PM

## 2017-02-26 ENCOUNTER — Ambulatory Visit (INDEPENDENT_AMBULATORY_CARE_PROVIDER_SITE_OTHER): Payer: Medicare Other | Admitting: Psychiatry

## 2017-02-26 DIAGNOSIS — F411 Generalized anxiety disorder: Secondary | ICD-10-CM | POA: Diagnosis not present

## 2017-02-26 NOTE — Telephone Encounter (Signed)
Pt is calling back about phone call she received today.-tr

## 2017-02-26 NOTE — Telephone Encounter (Signed)
ATC pt, no answer. Left message for pt to call back.  

## 2017-02-26 NOTE — Telephone Encounter (Signed)
Yes. That will be fine. Thanks

## 2017-02-26 NOTE — Telephone Encounter (Signed)
PM please advise on allergy testing results. I didn't see a result note in her chart.

## 2017-02-26 NOTE — Telephone Encounter (Signed)
Called pt and advised message from the provider. Pt understood and verbalized understanding. Nothing further is needed.   Samples up front for pt to pick up.

## 2017-02-26 NOTE — Telephone Encounter (Signed)
Spoke with pt, advised message from PM. Pt verbalized understanding and states she is in the doughnut hole with her insurance so non of the inhalers are covered. I thought that it would be easier to give her samples of Symbicort because we usually have more samples and she would rather stay on this inhaler. PM let me know if I can give her samples to last until January.

## 2017-02-26 NOTE — Telephone Encounter (Signed)
Blood allergy test showed mild sensitivity to cat, dog and pollen. CBC is normal Instead of the symbicort we can try anoro. Can you see if this is covered by the insurance? Thanks

## 2017-03-02 DIAGNOSIS — R1084 Generalized abdominal pain: Secondary | ICD-10-CM | POA: Diagnosis not present

## 2017-03-03 ENCOUNTER — Other Ambulatory Visit: Payer: Self-pay | Admitting: Internal Medicine

## 2017-03-04 DIAGNOSIS — R0602 Shortness of breath: Secondary | ICD-10-CM | POA: Diagnosis not present

## 2017-03-04 DIAGNOSIS — G20 Parkinson's disease: Secondary | ICD-10-CM | POA: Diagnosis not present

## 2017-03-04 DIAGNOSIS — I951 Orthostatic hypotension: Secondary | ICD-10-CM | POA: Diagnosis not present

## 2017-03-04 DIAGNOSIS — I480 Paroxysmal atrial fibrillation: Secondary | ICD-10-CM | POA: Diagnosis not present

## 2017-03-06 ENCOUNTER — Telehealth: Payer: Self-pay | Admitting: Pulmonary Disease

## 2017-03-06 ENCOUNTER — Encounter (HOSPITAL_COMMUNITY)
Admission: RE | Admit: 2017-03-06 | Discharge: 2017-03-06 | Disposition: A | Discharge status: Home / Self Care | Payer: Medicare Other | Source: Ambulatory Visit | Attending: Pulmonary Disease | Admitting: Pulmonary Disease

## 2017-03-06 ENCOUNTER — Other Ambulatory Visit: Payer: Self-pay

## 2017-03-06 DIAGNOSIS — R0609 Other forms of dyspnea: Principal | ICD-10-CM

## 2017-03-06 MED ORDER — DULOXETINE HCL 30 MG PO CPEP: 30.0000 mg | ORAL_CAPSULE | Freq: Every day | ORAL | 0 refills | Status: DC

## 2017-03-06 NOTE — Telephone Encounter (Signed)
pulm rehab is currently closed. Will call on Monday to make them aware of PM's below message.

## 2017-03-06 NOTE — Telephone Encounter (Signed)
Spoke with Parke Simmers with pulmonary rehab, who states that pulm rehab recommends that pt be referred to [facility]. Parke Simmers states pt has been falling due to her parkinson's disease and [facility] will better assist with this matter.   PM please advise if okay to refer to [facility]? Thanks.

## 2017-03-06 NOTE — Progress Notes (Signed)
Patient arrived to her 1330 pulmonary rehab orientation appointment early. Very pleasant lady. RN noted patient very unsteady while ambulating short distances using cane. Patient states she fell several months ago resulting in large hematoma to buttocks and thigh that required Md monitoring. Patient states since fall she is extremely afraid of falling again and often catches herself being off balance. She complains of dizziness and "shakiness" with ambulating. She has a history of parkinson. These symptoms are exacerbated by quick movements. She states she was enrolled in an exercise program for parkinson's 3 years ago but never completed the program related to denial of her diagnosis. RN feels patient would benefit from a [facility] referral prior to her beginning group exercise in pulmonary rehab for safety. RN called Dr. Matilde Bash office and requested referral be placed if MD feels appropriate.

## 2017-03-06 NOTE — Telephone Encounter (Signed)
Yes. Refer to [facility] will be fine.

## 2017-03-06 NOTE — Telephone Encounter (Signed)
Spoke with [name], she is coming in this afternoon for orientation, has a diagnosis of COPD, post bronch PFT has a score for  FEV1  At 77% and it has to be under 70% to qualify. We would have to put another diagnosis on her referral. This was done so pt can start orientation today. Nothing further is needed.

## 2017-03-09 NOTE — Telephone Encounter (Signed)
Spoke to [place] with pulm rehab and made her aware of PM's below message. Nothing further needed.

## 2017-03-16 ENCOUNTER — Telehealth: Payer: Self-pay | Admitting: Medical

## 2017-03-16 ENCOUNTER — Ambulatory Visit (HOSPITAL_BASED_OUTPATIENT_CLINIC_OR_DEPARTMENT_OTHER)
Admission: RE | Admit: 2017-03-16 | Discharge: 2017-03-16 | Disposition: A | Discharge status: Home / Self Care | Payer: Medicare Other | Source: Ambulatory Visit | Attending: Medical | Admitting: Medical

## 2017-03-16 ENCOUNTER — Telehealth: Payer: Self-pay

## 2017-03-16 ENCOUNTER — Encounter: Payer: Self-pay | Admitting: Medical

## 2017-03-16 ENCOUNTER — Ambulatory Visit (INDEPENDENT_AMBULATORY_CARE_PROVIDER_SITE_OTHER): Payer: Medicare Other | Admitting: Medical

## 2017-03-16 VITALS — BP 103/63 | HR 87 | Temp 97.8°F | Resp 16 | Wt 150.0 lb

## 2017-03-16 DIAGNOSIS — R55 Syncope and collapse: Secondary | ICD-10-CM | POA: Diagnosis not present

## 2017-03-16 DIAGNOSIS — R42 Dizziness and giddiness: Secondary | ICD-10-CM

## 2017-03-16 DIAGNOSIS — R0602 Shortness of breath: Secondary | ICD-10-CM | POA: Diagnosis not present

## 2017-03-16 DIAGNOSIS — R5383 Other fatigue: Secondary | ICD-10-CM

## 2017-03-16 DIAGNOSIS — R269 Unspecified abnormalities of gait and mobility: Secondary | ICD-10-CM

## 2017-03-16 DIAGNOSIS — R251 Tremor, unspecified: Secondary | ICD-10-CM | POA: Diagnosis not present

## 2017-03-16 DIAGNOSIS — G319 Degenerative disease of nervous system, unspecified: Secondary | ICD-10-CM | POA: Diagnosis not present

## 2017-03-16 DIAGNOSIS — H814 Vertigo of central origin: Secondary | ICD-10-CM

## 2017-03-16 DIAGNOSIS — R0600 Dyspnea, unspecified: Secondary | ICD-10-CM | POA: Diagnosis not present

## 2017-03-16 DIAGNOSIS — H8149 Vertigo of central origin, unspecified ear: Secondary | ICD-10-CM

## 2017-03-16 DIAGNOSIS — F439 Reaction to severe stress, unspecified: Secondary | ICD-10-CM

## 2017-03-16 DIAGNOSIS — R911 Solitary pulmonary nodule: Secondary | ICD-10-CM

## 2017-03-16 LAB — TROPONIN I: TNIDX: 0.03 ug/l (ref 0.00–0.06)

## 2017-03-16 LAB — D-DIMER, QUANTITATIVE (NOT AT ARMC): D DIMER QUANT: 0.32 ug{FEU}/mL (ref <0.50)

## 2017-03-16 NOTE — Telephone Encounter (Signed)
Printed copy of patient s CXR done today placed on E. Saguiers desk for review, Information systems manager.

## 2017-03-16 NOTE — Telephone Encounter (Signed)
I later got patient's d-dimer back which was not elevated.  Also when I was putting him the order for CT angiogram I saw that she was on Eliquis.  So when I called her to tell her that the d-dimer results I confirmed that she has been taking Eliquis without missing any doses for years.  In light of the negative d-dimer and using Eliquis decided not to do CT chest angiogram.  I just put in the CT of chest without contrast.  Please cancel the CT angiogram ordered.  I try to do that last night but could not find how to do it in epic.  Would you try to get her scheduled tomorrow morning.  She has some home health he will come into her house early in the morning and they could bring her back to the hospital.  If you schedule her late in the afternoon then there might be transportation issues.  If you can cancel the CT angiogram in the computer then would you have radiology department to do so.

## 2017-03-16 NOTE — Patient Instructions (Addendum)
For your episodes of near syncope, fatigue, and  dizziness  I will get CT of your head.  Then decide if need further studies, expidited referral to Dr. Carles Collet or referral to PT for balance/gait change.  For fatigue will get CBC, CMP, UA, and urine culture.   For dyspnea that appears worse than your baseline  Will get a d-dimer and troponin.  Will also get chest x-ray.  If d-dimer is positive then we will need to arrange to have a CT of the chest.  I would recommend that the home health service staying with you increased her hours review today and tomorrow.  They did see cardiologist on Wednesday for loop recorder and echo.  You expressed some stress and anxiety over your general health.  Could use very low-dose Xanax 0.25 mg-0.5 mg if needed(max frequency will be every 8 hours.).  However cautioned regarding potential side effects.  If you have worse signs and symptoms then recommend ED evaluation.  Follow-up with me 4-7 days or as needed.  For the next appointment also would request that you ask a 30-minute slot.  EKG today showed atrial fibrillation with normal sinus rhythm

## 2017-03-16 NOTE — Telephone Encounter (Signed)
I need for patient to get a CT angiogram tomorrow morning stat.  Please work on prior authorization.

## 2017-03-16 NOTE — Progress Notes (Signed)
Subjective:    Patient ID: [name], female    DOB: June 18, 1927, 83 y.o.   MRN: [id number]  HPI  Pt in for follow up.  Pt cardiologist her about one week ago. Pt will get echo on Wed and she will get loop recorder. Pt has history of atrial fibrillation. Last ekg 08-26-2016.  Pt called ems about one week ago. She felt really bad on November 11,2018. Ems came out and did ekg. They stayed with her. She felt real weak and felt like she was about to pass out. She states intermittent episodes about to pass out.   Her exercise tolerance is very low. Get winded with short walks.  Pt cardiologist does not think she needs to be hospitalized. Pt has home care that will come out and stay with her for 3 hours a day presently.  No leg pain. No swelling in her legs.  Pt feels weak and shaky. She sees Dr. Carles Collet and wants to have sooner appointment. Pt balance is off.  She has fatigue and easily sleeps.   Pt is set up for pulmonary for copd  but they think she needs vestibular rehab. She has severe light headed sensation at times/severe. But denies any vertigo. When she walks she states deviates easily.  She states so much sob she can't stand well.   Pt is overall losing hope of her general health conditions. States at times feels like what the use. She is not having homicidal or suicidal ideations.     Review of Systems  Constitutional: Positive for fatigue. Negative for chills and fever.  Respiratory: Positive for shortness of breath. Negative for cough, chest tightness and wheezing.   Cardiovascular: Negative for chest pain and palpitations.  Gastrointestinal: Negative for abdominal pain, constipation, nausea and vomiting.  Genitourinary: Negative for dysuria, enuresis, flank pain and urgency.  Musculoskeletal: Negative for arthralgias, back pain, myalgias, neck pain and neck stiffness.  Skin: Negative for rash.  Neurological: Positive for dizziness and light-headedness. Negative for  tremors, seizures, syncope, speech difficulty, weakness, numbness and headaches.       Near syncope.  Hematological: Negative for adenopathy. Does not bruise/bleed easily.  Psychiatric/Behavioral: Negative for behavioral problems, decreased concentration, dysphoric mood and hallucinations. The patient is not nervous/anxious.    Past Medical History:  Diagnosis Date  . Anxiety   . Arthritis   . Atrial fibrillation ([place])   . Cardiac arrhythmia due to congenital heart disease   . Chronic constipation   . COPD (chronic obstructive pulmonary disease) ([place])   . Depression   . GERD (gastroesophageal reflux disease)   . Hemorrhoid   . Hyperlipidemia   . Idiopathic hypotension   . Lightheadedness 11/2015  . Melanoma of skin ([place]) 12/06/2016  . Mycotic toenails 10/27/2012  . Parkinson disease ([place])   . Recurrent UTI   . 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 Varicose veins      Social History   Socioeconomic History  . Marital status: Widowed    Spouse name: Not on file  . Number of children: 2  . Years of education: Not on file  . Highest education level: Not on file  Social Needs  . Financial resource strain: Not on file  . Food insecurity - worry: Not on file  . Food insecurity - inability: Not on file  . Transportation needs - medical: Not on file  . Transportation needs - non-medical: Not on file  Occupational History  . Occupation: retiredFirefighter ,  elementary school [employer]  Tobacco Use  . Smoking status: Former Smoker    Packs/day: 2.00    Years: 30.00    Pack years: 60.00    Types: Cigarettes    Last attempt to quit: 04/21/1980    Years since quitting: 36.9  . Smokeless tobacco: Never Used  . Tobacco comment: onset age 62 -42, up to > 1ppd (almost 2 ppd)  Substance and Sexual Activity  . Alcohol use: No    Alcohol/week: 0.0 oz  . Drug use: No  . Sexual activity: No  Other Topics Concern  . Not on file  Social History Narrative   Widowed, lives alone. 1  child in [place] and 1 in [place]. 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   Still drives as of 99-37    Past Surgical History:  Procedure Laterality Date  . APPENDECTOMY  83 years old  . COLON RESECTION  2008  . melanoma removal Left    left cheek  . vitriectomy  08-2015    Family History  Problem Relation Age of Onset  . Asthma Brother   . Alcohol abuse Brother   . Throat cancer Father   . Alcohol abuse Father   . Lupus Daughter   . Bipolar disorder Daughter   . Lupus Daughter   . Anxiety disorder Daughter   . Alcohol abuse Sister   . Alcohol abuse Maternal Grandfather   . Alcohol abuse Paternal Grandmother   . Breast cancer Sister   . Colon cancer Neg Hx     Allergies  Allergen Reactions  . Adhesive [Tape] Other (See Comments)    Caused blisters  . Amoxicillin Other (See Comments)    Has patient had a PCN reaction causing immediate rash, facial/tongue/throat swelling, SOB or lightheadedness with hypotension: Yes Has patient had a PCN reaction causing severe rash involving mucus membranes or skin necrosis: No Has patient had a PCN reaction that required hospitalization No Has patient had a PCN reaction occurring within the last 10 years: Yes If all of the above answers are "NO", then may proceed with Cephalosporin use.   Caused thrush  . Ciprofloxacin Other (See Comments)    Caused thrush  . Ultram [Tramadol] Other (See Comments)    hypotension    Current Outpatient Medications on File Prior to Visit  Medication Sig Dispense Refill  . acetaminophen (TYLENOL) 325 MG tablet Take 2 tablets (650 mg total) by mouth every 6 (six) hours as needed for mild pain (or Fever >/= 101).    Marland Kitchen albuterol (VENTOLIN HFA) 108 (90 BASE) MCG/ACT inhaler Inhale 2 puffs into the lungs 2 (two) times daily as needed for wheezing or shortness of breath. 18 g 4  . ALPRAZolam (XANAX) 0.5 MG tablet Take 0.5-1 tablets (0.25-0.5 mg total) by mouth 2 (two) times daily as needed for anxiety.  30 tablet 1  . apixaban (ELIQUIS) 5 MG TABS tablet Take 5 mg by mouth 2 (two) times daily.    . Ascorbic Acid (VITAMIN C) 1000 MG tablet Take 1,000 mg by mouth daily.    Marland Kitchen azelastine (ASTELIN) 0.1 % nasal spray Place 2 sprays into both nostrils at bedtime as needed for rhinitis. Use in each nostril as directed 30 mL 3  . budesonide-formoterol (SYMBICORT) 160-4.5 MCG/ACT inhaler Inhale 2 puffs into the lungs 2 (two) times daily. 30.6 g 1  . cholecalciferol (VITAMIN D) 1000 units tablet Take 1,000 Units by mouth daily.    Marland Kitchen diltiazem (CARDIZEM)  30 MG tablet Take 1 tablet (30 mg total) 4 (four) times daily by mouth. 360 tablet 0  . DULoxetine (CYMBALTA) 30 MG capsule Take 1 capsule (30 mg total) daily by mouth. 90 capsule 0  . ipratropium-albuterol (DUONEB) 0.5-2.5 (3) MG/3ML SOLN Take 3 mLs by nebulization every 6 (six) hours as needed (Dyspnea/wheezing).    . polyethylene glycol powder (GLYCOLAX/MIRALAX) powder Take 17 g by mouth 2 (two) times daily. 255 g 0  . Propylene Glycol (SYSTANE BALANCE OP) Apply 2 drops to eye 2 (two) times daily.      No current facility-administered medications on file prior to visit.     BP 103/63   Pulse 87   Temp 97.8 F (36.6 C) (Oral)   Resp 16   Wt 150 lb (68 kg)   SpO2 99%   BMI 20.92 kg/m       Objective:   Physical Exam  General Mental Status- Alert. General Appearance- Not in acute distress.   Skin General: Color- Normal Color. Moisture- Normal Moisture.  Neck Carotid Arteries- Normal color. Moisture- Normal Moisture. No carotid bruits. No JVD.  Chest and Lung Exam Auscultation: Breath Sounds:-Normal.  Cardiovascular Auscultation:Rythm- Regular. Murmurs & Other Heart Sounds:Auscultation of the heart reveals- No Murmurs.  Abdomen Inspection:-Inspeection Normal. Palpation/Percussion:Note:No mass. Palpation and Percussion of the abdomen reveal- Non Tender, Non Distended + BS, no rebound or guarding.    Neurologic Cranial Nerve  exam:- CN III-XII intact(No nystagmus), symmetric smile. Finger to Nose:- Normal/Intact Strength:- 5/5 equal and symmetric strength both upper and lower extremities.      Assessment & Plan:  For your episodes of near syncope, fatigue, and  dizziness  I will get CT of your head.  Then decide if need further studies, expidited referral to Dr. Carles Collet or referral to PT for balance/gait change.  For fatigue will get CBC, CMP, UA, and urine culture.   For dyspnea that appears worse than your baseline  Will get a d-dimer and troponin.  Will also get chest x-ray.  If d-dimer is positive then we will need to arrange to have a CT of the chest.  I would recommend that the home health service staying with you increased  hours review today and tomorrow.  Then did see cardiologist on Wednesday for loop recorder and echo.  You expressed some stress and anxiety over your general health.  Could use very low-dose Xanax 0.25 mg-0.5 mg if needed(max frequency will be every 8 hours.).  However cautioned regarding potential side effects.  If you have worse signs and symptoms then recommend ED evaluation.  Follow-up with me 4-7 days or as needed.  For the next appointment also would request that you ask a 30-minute slot.  EKG today showed atrial fibrillation but it was sinus rhythm.  40 minutes total time spent with pt. 50% of time spent discussing differential diagnosis and arranging studies/work up of for  her various potentially worrisome diagnosis based on recent reported signs and symptoms.(Note extensive lab, imaging study work up and ekg done)   Architect, [place], [employer]

## 2017-03-17 ENCOUNTER — Telehealth: Payer: Self-pay

## 2017-03-17 ENCOUNTER — Ambulatory Visit (HOSPITAL_BASED_OUTPATIENT_CLINIC_OR_DEPARTMENT_OTHER)
Admission: RE | Admit: 2017-03-17 | Discharge: 2017-03-17 | Disposition: A | Discharge status: Home / Self Care | Payer: Medicare Other | Source: Ambulatory Visit | Attending: Medical | Admitting: Medical

## 2017-03-17 ENCOUNTER — Encounter: Payer: Self-pay | Admitting: Medical

## 2017-03-17 ENCOUNTER — Telehealth: Payer: Self-pay | Admitting: Medical

## 2017-03-17 DIAGNOSIS — I700 Atherosclerosis of aorta: Secondary | ICD-10-CM | POA: Insufficient documentation

## 2017-03-17 DIAGNOSIS — I2510 Atherosclerotic heart disease of native coronary artery without angina pectoris: Secondary | ICD-10-CM | POA: Insufficient documentation

## 2017-03-17 DIAGNOSIS — R918 Other nonspecific abnormal finding of lung field: Secondary | ICD-10-CM | POA: Insufficient documentation

## 2017-03-17 DIAGNOSIS — R590 Localized enlarged lymph nodes: Secondary | ICD-10-CM | POA: Diagnosis not present

## 2017-03-17 DIAGNOSIS — R911 Solitary pulmonary nodule: Secondary | ICD-10-CM

## 2017-03-17 DIAGNOSIS — E875 Hyperkalemia: Secondary | ICD-10-CM

## 2017-03-17 DIAGNOSIS — I712 Thoracic aortic aneurysm, without rupture: Secondary | ICD-10-CM | POA: Insufficient documentation

## 2017-03-17 LAB — CBC WITH DIFFERENTIAL/PLATELET
BASOS PCT: 0.9 % (ref 0.0–3.0)
Basophils Absolute: 0.1 10*3/uL (ref 0.0–0.1)
EOS PCT: 1.3 % (ref 0.0–5.0)
Eosinophils Absolute: 0.1 10*3/uL (ref 0.0–0.7)
HCT: 41.5 % (ref 36.0–46.0)
HEMOGLOBIN: 13.6 g/dL (ref 12.0–15.0)
Lymphocytes Relative: 23.3 % (ref 12.0–46.0)
Lymphs Abs: 1.6 10*3/uL (ref 0.7–4.0)
MCHC: 32.7 g/dL (ref 30.0–36.0)
MCV: 99.4 fl (ref 78.0–100.0)
MONO ABS: 0.6 10*3/uL (ref 0.1–1.0)
MONOS PCT: 9.2 % (ref 3.0–12.0)
Neutro Abs: 4.4 10*3/uL (ref 1.4–7.7)
Neutrophils Relative %: 65.3 % (ref 43.0–77.0)
Platelets: 214 10*3/uL (ref 150.0–400.0)
RBC: 4.17 Mil/uL (ref 3.87–5.11)
RDW: 14 % (ref 11.5–15.5)
WBC: 6.7 10*3/uL (ref 4.0–10.5)

## 2017-03-17 LAB — COMPREHENSIVE METABOLIC PANEL
ALBUMIN: 4.1 g/dL (ref 3.5–5.2)
ALK PHOS: 85 U/L (ref 39–117)
ALT: 12 U/L (ref 0–35)
AST: 18 U/L (ref 0–37)
BUN: 17 mg/dL (ref 6–23)
CALCIUM: 9.9 mg/dL (ref 8.4–10.5)
CHLORIDE: 102 meq/L (ref 96–112)
CO2: 29 mEq/L (ref 19–32)
Creatinine, Ser: 0.95 mg/dL (ref 0.40–1.20)
GFR: 59.12 mL/min — AB (ref >60.00)
Glucose, Bld: 86 mg/dL (ref 70–99)
POTASSIUM: 5.4 meq/L — AB (ref 3.5–5.1)
SODIUM: 139 meq/L (ref 135–145)
TOTAL PROTEIN: 7.1 g/dL (ref 6.0–8.3)
Total Bilirubin: 0.5 mg/dL (ref 0.2–1.2)

## 2017-03-17 LAB — TSH: TSH: 4.08 u[IU]/mL (ref 0.35–4.50)

## 2017-03-17 NOTE — Telephone Encounter (Signed)
Jane with [facility] called with critical Chest CT. See imaging report. Will notify provider.

## 2017-03-17 NOTE — Telephone Encounter (Signed)
Already managed by E Saguier PA

## 2017-03-17 NOTE — Telephone Encounter (Signed)
Lung mass seen on CT of chest without contrast.  Radiologist now wants to get a PET scan.  I put in the order.  Would you please try to arrange this study on Thursday morning.  Patient has a cardiologist appointment tomorrow.  Let me know he has schedule patient by tomorrow.

## 2017-03-17 NOTE — Telephone Encounter (Signed)
Teacher order CMP placed.

## 2017-03-17 NOTE — Telephone Encounter (Signed)
IMPRESSION: 4.2 cm mass in the medial right lower lobe with adjacent right hilar lymphadenopathy. Findings are highly concerning for a primary lung neoplasm with right hilar lymphadenopathy. Consider further characterization with a PET-CT.  Aneurysm of the ascending thoracic aorta measuring up to 4.3 cm. Recommend annual imaging followup by CTA or MRA. This recommendation follows 2010 ACCF/AHA/AATS/ACR/ASA/SCA/SCAI/SIR/STS/SVM Guidelines for the Diagnosis and Management of Patients with Thoracic Aortic Disease. Circulation. 2010; 121: I502-D741  Numerous small pulmonary nodules throughout both lungs. Most of these nodules are indeterminate. Largest nodule in left lower lobe measures 0.6 cm and minimally changed since 2016.  Aortic Atherosclerosis (ICD10-I70.0). Coronary artery calcifications.

## 2017-03-18 ENCOUNTER — Telehealth: Payer: Self-pay | Admitting: Medical

## 2017-03-18 DIAGNOSIS — R0602 Shortness of breath: Secondary | ICD-10-CM | POA: Diagnosis not present

## 2017-03-18 LAB — URINE CULTURE
MICRO NUMBER:: 81330104
RESULT: NO GROWTH
SPECIMEN QUALITY: ADEQUATE

## 2017-03-18 NOTE — Telephone Encounter (Signed)
I talked with patient's daughter Kennyth Lose today.  I explained updated her about the inserting mass on the CT chest.  I explained to her the next steps in terms of PET scan and that if PET scan confirms CT chest findings then I would very near future be trying to get her in with pulmonologist and oncologist.  Would you mind investigating we have another option to get patient's PET scan done earlier than on Monday.  If not just let me know either way.  Do not cancel patient's PET scan for Monday until we have other definitive option.  Also when he scheduled the PET scan did you confirm that I actually wrote the correct order.  I will order PET scans occasionally and just want to make sure that I picked the right one in epic

## 2017-03-18 NOTE — Telephone Encounter (Signed)
Pt notified by CMA in office and pt is scheduled for Pet Scan on Monday @ 10:30am.

## 2017-03-20 ENCOUNTER — Other Ambulatory Visit (INDEPENDENT_AMBULATORY_CARE_PROVIDER_SITE_OTHER): Payer: Medicare Other

## 2017-03-20 DIAGNOSIS — E875 Hyperkalemia: Secondary | ICD-10-CM | POA: Diagnosis not present

## 2017-03-20 LAB — COMPREHENSIVE METABOLIC PANEL
ALBUMIN: 4 g/dL (ref 3.5–5.2)
ALK PHOS: 80 U/L (ref 39–117)
ALT: 13 U/L (ref 0–35)
AST: 17 U/L (ref 0–37)
BUN: 18 mg/dL (ref 6–23)
CALCIUM: 9.6 mg/dL (ref 8.4–10.5)
CHLORIDE: 102 meq/L (ref 96–112)
CO2: 32 mEq/L (ref 19–32)
Creatinine, Ser: 0.91 mg/dL (ref 0.40–1.20)
GFR: 62.13 mL/min (ref >60.00)
Glucose, Bld: 63 mg/dL — ABNORMAL LOW (ref 70–99)
POTASSIUM: 4.5 meq/L (ref 3.5–5.1)
SODIUM: 139 meq/L (ref 135–145)
TOTAL PROTEIN: 6.9 g/dL (ref 6.0–8.3)
Total Bilirubin: 0.6 mg/dL (ref 0.2–1.2)

## 2017-03-23 ENCOUNTER — Ambulatory Visit (HOSPITAL_COMMUNITY)
Admission: RE | Admit: 2017-03-23 | Discharge: 2017-03-23 | Disposition: A | Discharge status: Home / Self Care | Payer: Medicare Other | Source: Ambulatory Visit | Attending: Medical | Admitting: Medical

## 2017-03-23 ENCOUNTER — Telehealth: Payer: Self-pay | Admitting: Internal Medicine

## 2017-03-23 ENCOUNTER — Telehealth: Payer: Self-pay | Admitting: Medical

## 2017-03-23 DIAGNOSIS — Z01812 Encounter for preprocedural laboratory examination: Secondary | ICD-10-CM | POA: Insufficient documentation

## 2017-03-23 DIAGNOSIS — R918 Other nonspecific abnormal finding of lung field: Secondary | ICD-10-CM | POA: Diagnosis not present

## 2017-03-23 DIAGNOSIS — C787 Secondary malignant neoplasm of liver and intrahepatic bile duct: Secondary | ICD-10-CM | POA: Diagnosis not present

## 2017-03-23 DIAGNOSIS — R911 Solitary pulmonary nodule: Secondary | ICD-10-CM | POA: Insufficient documentation

## 2017-03-23 DIAGNOSIS — C3491 Malignant neoplasm of unspecified part of right bronchus or lung: Secondary | ICD-10-CM

## 2017-03-23 LAB — GLUCOSE, CAPILLARY: GLUCOSE-CAPILLARY: 80 mg/dL (ref 65–99)

## 2017-03-23 MED ORDER — FLUDEOXYGLUCOSE F - 18 (FDG) INJECTION: 1105.0000 | Freq: Once | INTRAVENOUS | Status: DC | PRN

## 2017-03-23 MED ORDER — FLUDEOXYGLUCOSE F - 18 (FDG) INJECTION
7.5400 | Freq: Once | INTRAVENOUS | Status: AC | PRN
  Administered 2017-03-23: 7.54 via INTRAVENOUS

## 2017-03-23 NOTE — Telephone Encounter (Signed)
Open to review and refer patient to specialist.

## 2017-03-23 NOTE — Telephone Encounter (Unsigned)
Copied from [place] [phone]. Topic: Quick Communication - See Telephone Encounter >> Mar 23, 2017  1:37 PM Aurelio Brash B wrote: CRM for notification. See Telephone encounter for:  PT called to know when pet scan results would be back and if she would get a phone call with results 03/23/17.

## 2017-03-23 NOTE — Telephone Encounter (Signed)
Would you please h have [name] try to arrange referral to oncologist and pulmonologist sometime this week.  Patient has lung cancer and it looks like it had metastasized to her liver.  I will try to send this message to [facility] but for whatever reason sometimes she is not get by telephone message notes.  Would you ask her to update me by Wednesday morning on the referrals.  Also have oncology and pulmonology call patient with appointment date/time but I also called the daughter [name].

## 2017-03-25 ENCOUNTER — Ambulatory Visit (HOSPITAL_BASED_OUTPATIENT_CLINIC_OR_DEPARTMENT_OTHER): Payer: Medicare Other | Admitting: Hematology & Oncology

## 2017-03-25 ENCOUNTER — Other Ambulatory Visit (HOSPITAL_BASED_OUTPATIENT_CLINIC_OR_DEPARTMENT_OTHER): Payer: Medicare Other

## 2017-03-25 ENCOUNTER — Other Ambulatory Visit: Payer: Self-pay

## 2017-03-25 ENCOUNTER — Encounter: Payer: Self-pay | Admitting: Hematology & Oncology

## 2017-03-25 DIAGNOSIS — I4891 Unspecified atrial fibrillation: Secondary | ICD-10-CM

## 2017-03-25 DIAGNOSIS — R918 Other nonspecific abnormal finding of lung field: Secondary | ICD-10-CM

## 2017-03-25 DIAGNOSIS — Z720 Tobacco use: Secondary | ICD-10-CM

## 2017-03-25 DIAGNOSIS — Z85820 Personal history of malignant melanoma of skin: Secondary | ICD-10-CM | POA: Diagnosis not present

## 2017-03-25 HISTORY — DX: Other nonspecific abnormal finding of lung field: R91.8

## 2017-03-25 LAB — CBC WITH DIFFERENTIAL (CANCER CENTER ONLY)
BASO#: 0 10*3/uL (ref 0.0–0.2)
BASO%: 0.1 % (ref 0.0–2.0)
EOS ABS: 0.1 10*3/uL (ref 0.0–0.5)
EOS%: 1.1 % (ref 0.0–7.0)
HCT: 38.8 % (ref 34.8–46.6)
HEMOGLOBIN: 12.6 g/dL (ref 11.6–15.9)
LYMPH#: 1.5 10*3/uL (ref 0.9–3.3)
LYMPH%: 21.3 % (ref 14.0–48.0)
MCH: 32.3 pg (ref 26.0–34.0)
MCHC: 32.5 g/dL (ref 32.0–36.0)
MCV: 100 fL (ref 81–101)
MONO#: 0.7 10*3/uL (ref 0.1–0.9)
MONO%: 9.8 % (ref 0.0–13.0)
NEUT#: 4.8 10*3/uL (ref 1.5–6.5)
NEUT%: 67.7 % (ref 39.6–80.0)
PLATELETS: 202 10*3/uL (ref 145–400)
RBC: 3.9 10*6/uL (ref 3.70–5.32)
RDW: 13.8 % (ref 11.1–15.7)
WBC: 7.1 10*3/uL (ref 3.9–10.0)

## 2017-03-25 LAB — CMP (CANCER CENTER ONLY)
ALK PHOS: 89 U/L — AB (ref 26–84)
ALT: 20 U/L (ref 10–47)
AST: 26 U/L (ref 11–38)
Albumin: 3.6 g/dL (ref 3.3–5.5)
BUN: 16 mg/dL (ref 7–22)
CO2: 29 mEq/L (ref 18–33)
CREATININE: 1.1 mg/dL (ref 0.6–1.2)
Calcium: 9.8 mg/dL (ref 8.0–10.3)
Chloride: 103 mEq/L (ref 98–108)
Glucose, Bld: 95 mg/dL (ref 73–118)
Potassium: 4.3 mEq/L (ref 3.3–4.7)
SODIUM: 147 meq/L — AB (ref 128–145)
TOTAL PROTEIN: 7 g/dL (ref 6.4–8.1)
Total Bilirubin: 0.6 mg/dl (ref 0.20–1.60)

## 2017-03-25 NOTE — Progress Notes (Signed)
Referral MD  Reason for Referral: Stage IV bronchogenic carcinoma-no pathology yet.  Chief Complaint  Patient presents with  . Initial Assessment  : I will was told I had cancer.  HPI: Ms. Clingan is an incredibly youthful appearing 83 year old white female.  I had a great time talking with her.  She comes in with her nurses aide from her assisted living.  She is originally from [place].  She has been down in [place] for over 10 years.  She does have a history of tobacco use.  She probably has a 40-pack-year history of tobacco use.  She stopped about 35 years ago.  She has atrial fibrillation.  She has been on blood thinner for 8 years.  She has a past history of melanoma.  She said that she had  melanoma taken off her chest and taken off the left side of her face.  She began to have some issues with breathing and dizziness recently.  She does see a cardiologist.  She does see a pulmonologist.  She finally had a chest x-ray done by her family doctor.  This showed some fullness in the right hilar region.  She then had a CT scan of the chest done.  Unfortunately, this showed a significant abnormality with enlarged right infrahilar lymph node measuring 2.4 cm.  She had a right hilar lymph node measuring 1.4 cm.  She had a right lower lobe mass measuring 3.1 x 3 x 4.2 cm.  The upper abdomen looked unremarkable.  She then had a PET scan done.  This was done 2 days ago.  This showed hypermetabolic activity in the right lower lung mass.  There is activity in the right hilum.  However, there also was activity in 2 areas in the liver.  One area in the posterior right lobe measures 1.5 cm.  Another also in the right lobe measures 1 cm.  She was then referred to the [facility] for an evaluation.  She is doing okay.  She really has had no specific complaints outside of occasional dizziness.  She has had no double vision or blurred vision.  She has had no swallowing  problems.  There is no dysphagia or odynophagia.  She has no abdominal pain.  There is no issues with her bowels or bladder.  She has had no leg swelling.  She does have some compression stockings on.  She has had no rashes.  Her appetite has been okay.  She has had no weight loss.  Overall, I would say her performance status is ECOG 2.   Past Medical History:  Diagnosis Date  . Anxiety   . Arthritis   . Atrial fibrillation ([place])   . Cardiac arrhythmia due to congenital heart disease   . Chronic constipation   . COPD (chronic obstructive pulmonary disease) ([place])   . Depression   . GERD (gastroesophageal reflux disease)   . Hemorrhoid   . Hyperlipidemia   . Idiopathic hypotension   . Lightheadedness 11/2015  . Mass of lower lobe of right lung 03/25/2017  . Melanoma of skin ([place]) 12/06/2016  . Mycotic toenails 10/27/2012  . Parkinson disease ([place])   . Recurrent UTI   . 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 Varicose veins   :  Past Surgical History:  Procedure Laterality Date  . APPENDECTOMY  83 years old  . COLON RESECTION  2008  . melanoma removal Left    left cheek  . vitriectomy  08-2015  :  Current Outpatient Medications:  .  acetaminophen (TYLENOL) 325 MG tablet, Take 2 tablets (650 mg total) by mouth every 6 (six) hours as needed for mild pain (or Fever >/= 101)., Disp: , Rfl:  .  albuterol (VENTOLIN HFA) 108 (90 BASE) MCG/ACT inhaler, Inhale 2 puffs into the lungs 2 (two) times daily as needed for wheezing or shortness of breath., Disp: 18 g, Rfl: 4 .  ALPRAZolam (XANAX) 0.5 MG tablet, Take 0.5-1 tablets (0.25-0.5 mg total) by mouth 2 (two) times daily as needed for anxiety., Disp: 30 tablet, Rfl: 1 .  apixaban (ELIQUIS) 5 MG TABS tablet, Take 5 mg by mouth 2 (two) times daily., Disp: , Rfl:  .  Ascorbic Acid (VITAMIN C) 1000 MG tablet, Take 1,000 mg by mouth daily., Disp: , Rfl:  .  azelastine (ASTELIN) 0.1 % nasal spray, Place 2 sprays into both nostrils at  bedtime as needed for rhinitis. Use in each nostril as directed, Disp: 30 mL, Rfl: 3 .  budesonide-formoterol (SYMBICORT) 160-4.5 MCG/ACT inhaler, Inhale 2 puffs into the lungs 2 (two) times daily., Disp: 30.6 g, Rfl: 1 .  cholecalciferol (VITAMIN D) 1000 units tablet, Take 1,000 Units by mouth daily., Disp: , Rfl:  .  diltiazem (CARDIZEM) 30 MG tablet, Take 1 tablet (30 mg total) 4 (four) times daily by mouth. (Patient taking differently: Take 30 mg by mouth 3 (three) times daily. ), Disp: 360 tablet, Rfl: 0 .  DULoxetine (CYMBALTA) 30 MG capsule, Take 1 capsule (30 mg total) daily by mouth., Disp: 90 capsule, Rfl: 0 .  ipratropium-albuterol (DUONEB) 0.5-2.5 (3) MG/3ML SOLN, Take 3 mLs by nebulization every 6 (six) hours as needed (Dyspnea/wheezing)., Disp: , Rfl:  .  polyethylene glycol powder (GLYCOLAX/MIRALAX) powder, Take 17 g by mouth 2 (two) times daily., Disp: 255 g, Rfl: 0 .  Propylene Glycol (SYSTANE BALANCE OP), Apply 2 drops to eye 2 (two) times daily. , Disp: , Rfl: :  :  Allergies  Allergen Reactions  . Adhesive [Tape] Other (See Comments)    Caused blisters  . Amoxicillin Other (See Comments)    Has patient had a PCN reaction causing immediate rash, facial/tongue/throat swelling, SOB or lightheadedness with hypotension: Yes Has patient had a PCN reaction causing severe rash involving mucus membranes or skin necrosis: No Has patient had a PCN reaction that required hospitalization No Has patient had a PCN reaction occurring within the last 10 years: Yes If all of the above answers are "NO", then may proceed with Cephalosporin use.   Caused thrush  . Ciprofloxacin Other (See Comments)    Caused thrush  . Ultram [Tramadol] Other (See Comments)    hypotension  :  Family History  Problem Relation Age of Onset  . Asthma Brother   . Alcohol abuse Brother   . Throat cancer Father   . Alcohol abuse Father   . Lupus Daughter   . Bipolar disorder Daughter   . Lupus Daughter    . Anxiety disorder Daughter   . Alcohol abuse Sister   . Alcohol abuse Maternal Grandfather   . Alcohol abuse Paternal Grandmother   . Breast cancer Sister   . Colon cancer Neg Hx   :  Social History   Socioeconomic History  . Marital status: Widowed    Spouse name: Not on file  . Number of children: 2  . Years of education: Not on file  . Highest education level: Not on file  Social Needs  . Financial resource strain: Not  on file  . Food insecurity - worry: Not on file  . Food insecurity - inability: Not on file  . Transportation needs - medical: Not on file  . Transportation needs - non-medical: Not on file  Occupational History  . Occupation: retiredFirefighter , Animator  Tobacco Use  . Smoking status: Former Smoker    Packs/day: 2.00    Years: 30.00    Pack years: 60.00    Types: Cigarettes    Last attempt to quit: 04/21/1980    Years since quitting: 36.9  . Smokeless tobacco: Never Used  . Tobacco comment: onset age 24 -17, up to > 1ppd (almost 2 ppd)  Substance and Sexual Activity  . Alcohol use: No    Alcohol/week: 0.0 oz  . Drug use: No  . Sexual activity: No  Other Topics Concern  . Not on file  Social History Narrative   Widowed, lives alone. 1 child in [place] and 1 in [place]. 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  Exam: Elderly but well-nourished white female in no obvious distress.  Vital signs show temperature of 97.5.  Pulse 113.  Blood pressure 99/70.  Weight is 155 pounds.  Head exam shows no ocular or oral lesions.  There are no palpable cervical or supraclavicular lymph nodes.  Lungs are clear bilaterally.  Cardiac exam regular rate and rhythm consistent with atrial fibrillation.  She has no murmurs, rubs or bruits.  Abdomen is soft.  She has good bowel sounds.  There is no fluid wave.  There is no palpable liver or spleen tip.  Back exam shows no tenderness  over the spine, ribs or hips.  She has no obvious kyphosis.  Extremities shows compression stockings on her legs.  She has no edema in the legs.  She has osteoarthritic changes.  She has decent strength in upper and lower extremities.  Skin exam shows seborrheic keratoses on her back.  Neurological exam is nonfocal. _0 @   Recent Labs    03/25/17 1533  WBC 7.1  HGB 12.6  HCT 38.8  PLT 202   Recent Labs    03/25/17 1533  NA 147*  K 4.3  CL 103  CO2 29  GLUCOSE 95  BUN 16  CREATININE 1.1  CALCIUM 9.8    Blood smear review: None  Pathology: None    Assessment and Plan: Ms. Nottingham is a very nice 83 year old white female.  She has abnormalities on the CT scan of the chest and on the PET scan that is quite concerning for malignancy.  I would have to suspect that this is a bronchogenic carcinoma.  We definitely will need to get a biopsy.  Even though she is 83 years old, she seems to be in pretty decent health and if we needed to treat this, we could.  It is imperative that we get a biopsy.  I would have to think that this is going to be bronchogenic carcinoma by the appearance in the lung.  Given her tobacco use, squamous cell carcinoma is a possibility.  I cannot forget the fact that she has had melanoma.  Melanoma can certainly recur in this manner.  I spent a good hour with she and her nurses aide.  She understands that what she is dealing with is treatable but not curable.  We did not go over any prognostic information  since we do not know what we are dealing with as of yet.  We will see about a liver biopsy.  If this is not possible, then she will have to see her pulmonologist for a bronchoscopy.  She is on Eliquis.  I would think that she could come off Eliquis for a couple days and have no problems.  We will plan to get her back to see her once I have a biopsy report and confirmation of malignancy.  If this is malignant, then will have to send off genetic  analysis to see if there are any actionable mutations.  We will also need to make sure we get PD-L1/TMB to see about the possibility of immunotherapy.  I answered all of her questions.  She is incredibly nice.  I gave her a prayer blanket and she was very thankful for this.

## 2017-03-26 ENCOUNTER — Telehealth: Payer: Self-pay | Admitting: Hematology & Oncology

## 2017-03-26 LAB — LACTATE DEHYDROGENASE: LDH: 240 U/L (ref 125–245)

## 2017-03-26 NOTE — Telephone Encounter (Signed)
lmom for Vivien Rota at [facility] [phone] to inform of patient needing to be sched for US guided biopsy

## 2017-03-30 ENCOUNTER — Telehealth (HOSPITAL_COMMUNITY): Payer: Self-pay | Admitting: Radiology

## 2017-03-30 ENCOUNTER — Other Ambulatory Visit: Payer: Self-pay | Admitting: General Surgery

## 2017-03-30 ENCOUNTER — Other Ambulatory Visit: Payer: Self-pay | Admitting: Radiology

## 2017-03-30 NOTE — Telephone Encounter (Signed)
Pt's caregiver, Margaretmary Dys called and left VM that pt could not make it to her appointment scheduled for 03/21/17 for an US liver biopsy. Her appointment was cancelled. I told Ms. Eugene Garnet that I would call Ms. Nevills tomorrow to help her get her biopsy rescheduled. Caregiver and patient agreeable to this plan. JM

## 2017-03-31 ENCOUNTER — Encounter (HOSPITAL_COMMUNITY): Payer: Self-pay

## 2017-03-31 ENCOUNTER — Telehealth: Payer: Self-pay

## 2017-03-31 ENCOUNTER — Ambulatory Visit (HOSPITAL_COMMUNITY)
Admission: RE | Admit: 2017-03-31 | Discharge: 2017-03-31 | Disposition: A | Discharge status: Home / Self Care | Payer: Medicare Other | Source: Ambulatory Visit | Attending: Hematology & Oncology | Admitting: Hematology & Oncology

## 2017-03-31 NOTE — Telephone Encounter (Signed)
Received call from patient requesting instructions for Eliquis since her liver biopsy has been scheduled for 04/06/17. Per Dr. Marin Olp, pt to hold Eliquis beginning 12/14 & resume after procedure on Monday. Pt has difficulty repeating instructions. While on the phone, instructed pt to write down instructions, then read back. Encouraged pt to call back if she is unclear. dph

## 2017-04-01 ENCOUNTER — Other Ambulatory Visit: Payer: Self-pay | Admitting: Radiology

## 2017-04-03 ENCOUNTER — Ambulatory Visit: Payer: Medicare Other | Admitting: Podiatry

## 2017-04-03 ENCOUNTER — Other Ambulatory Visit: Payer: Self-pay | Admitting: General Surgery

## 2017-04-06 ENCOUNTER — Encounter (HOSPITAL_COMMUNITY): Payer: Self-pay

## 2017-04-06 ENCOUNTER — Encounter: Payer: Self-pay | Admitting: Internal Medicine

## 2017-04-06 ENCOUNTER — Ambulatory Visit (HOSPITAL_COMMUNITY)
Admission: RE | Admit: 2017-04-06 | Discharge: 2017-04-06 | Disposition: A | Discharge status: Home / Self Care | Payer: Medicare Other | Source: Ambulatory Visit | Attending: Hematology & Oncology | Admitting: Hematology & Oncology

## 2017-04-06 DIAGNOSIS — I2510 Atherosclerotic heart disease of native coronary artery without angina pectoris: Secondary | ICD-10-CM | POA: Diagnosis not present

## 2017-04-06 DIAGNOSIS — F329 Major depressive disorder, single episode, unspecified: Secondary | ICD-10-CM | POA: Diagnosis not present

## 2017-04-06 DIAGNOSIS — I700 Atherosclerosis of aorta: Secondary | ICD-10-CM | POA: Diagnosis not present

## 2017-04-06 DIAGNOSIS — C771 Secondary and unspecified malignant neoplasm of intrathoracic lymph nodes: Secondary | ICD-10-CM | POA: Insufficient documentation

## 2017-04-06 DIAGNOSIS — C3431 Malignant neoplasm of lower lobe, right bronchus or lung: Secondary | ICD-10-CM | POA: Insufficient documentation

## 2017-04-06 DIAGNOSIS — J449 Chronic obstructive pulmonary disease, unspecified: Secondary | ICD-10-CM | POA: Insufficient documentation

## 2017-04-06 DIAGNOSIS — G20 Parkinson's disease: Secondary | ICD-10-CM | POA: Insufficient documentation

## 2017-04-06 DIAGNOSIS — E785 Hyperlipidemia, unspecified: Secondary | ICD-10-CM | POA: Diagnosis not present

## 2017-04-06 DIAGNOSIS — K5909 Other constipation: Secondary | ICD-10-CM | POA: Diagnosis not present

## 2017-04-06 DIAGNOSIS — C787 Secondary malignant neoplasm of liver and intrahepatic bile duct: Secondary | ICD-10-CM | POA: Insufficient documentation

## 2017-04-06 DIAGNOSIS — R918 Other nonspecific abnormal finding of lung field: Secondary | ICD-10-CM

## 2017-04-06 DIAGNOSIS — F419 Anxiety disorder, unspecified: Secondary | ICD-10-CM | POA: Insufficient documentation

## 2017-04-06 DIAGNOSIS — Z79899 Other long term (current) drug therapy: Secondary | ICD-10-CM | POA: Diagnosis not present

## 2017-04-06 DIAGNOSIS — Z87891 Personal history of nicotine dependence: Secondary | ICD-10-CM | POA: Insufficient documentation

## 2017-04-06 DIAGNOSIS — C3490 Malignant neoplasm of unspecified part of unspecified bronchus or lung: Secondary | ICD-10-CM | POA: Diagnosis not present

## 2017-04-06 DIAGNOSIS — Z7901 Long term (current) use of anticoagulants: Secondary | ICD-10-CM | POA: Insufficient documentation

## 2017-04-06 DIAGNOSIS — K7689 Other specified diseases of liver: Secondary | ICD-10-CM | POA: Diagnosis not present

## 2017-04-06 DIAGNOSIS — Z85820 Personal history of malignant melanoma of skin: Secondary | ICD-10-CM | POA: Insufficient documentation

## 2017-04-06 DIAGNOSIS — Z87440 Personal history of urinary (tract) infections: Secondary | ICD-10-CM | POA: Diagnosis not present

## 2017-04-06 DIAGNOSIS — I4891 Unspecified atrial fibrillation: Secondary | ICD-10-CM | POA: Insufficient documentation

## 2017-04-06 DIAGNOSIS — C779 Secondary and unspecified malignant neoplasm of lymph node, unspecified: Secondary | ICD-10-CM | POA: Diagnosis not present

## 2017-04-06 DIAGNOSIS — K219 Gastro-esophageal reflux disease without esophagitis: Secondary | ICD-10-CM | POA: Diagnosis not present

## 2017-04-06 LAB — CBC
HCT: 38.1 % (ref 36.0–46.0)
Hemoglobin: 12.2 g/dL (ref 12.0–15.0)
MCH: 31.4 pg (ref 26.0–34.0)
MCHC: 32 g/dL (ref 30.0–36.0)
MCV: 97.9 fL (ref 78.0–100.0)
PLATELETS: 193 10*3/uL (ref 150–400)
RBC: 3.89 MIL/uL (ref 3.87–5.11)
RDW: 14 % (ref 11.5–15.5)
WBC: 6.3 10*3/uL (ref 4.0–10.5)

## 2017-04-06 LAB — PROTIME-INR
INR: 1.1
PROTHROMBIN TIME: 14.1 s (ref 11.4–15.2)

## 2017-04-06 MED ORDER — LIDOCAINE-EPINEPHRINE 1 %-1:100000 IJ SOLN
INTRAMUSCULAR | Status: AC
  Filled 2017-04-06: qty 1

## 2017-04-06 MED ORDER — GELATIN ABSORBABLE 12-7 MM EX MISC
CUTANEOUS | Status: AC
  Filled 2017-04-06: qty 1

## 2017-04-06 MED ORDER — SODIUM CHLORIDE 0.9 % IV SOLN
INTRAVENOUS | Status: DC
  Administered 2017-04-06: 14:00:00 via INTRAVENOUS

## 2017-04-06 MED ORDER — FENTANYL CITRATE (PF) 100 MCG/2ML IJ SOLN
INTRAMUSCULAR | Status: AC
  Filled 2017-04-06: qty 4

## 2017-04-06 MED ORDER — FENTANYL CITRATE (PF) 100 MCG/2ML IJ SOLN
INTRAMUSCULAR | Status: AC | PRN
  Administered 2017-04-06: 25 ug via INTRAVENOUS

## 2017-04-06 MED ORDER — MIDAZOLAM HCL 2 MG/2ML IJ SOLN
INTRAMUSCULAR | Status: AC | PRN
  Administered 2017-04-06: 0.5 mg via INTRAVENOUS

## 2017-04-06 MED ORDER — MIDAZOLAM HCL 2 MG/2ML IJ SOLN
INTRAMUSCULAR | Status: AC
  Filled 2017-04-06: qty 4

## 2017-04-06 NOTE — Sedation Documentation (Signed)
Patient is resting comfortably. 

## 2017-04-06 NOTE — Discharge Instructions (Signed)
RESUME ELIQUIS ON 04/07/17 EVENING DOSE  Liver Biopsy, Care After These instructions give you information on caring for yourself after your procedure. Your doctor may also give you more specific instructions. Call your doctor if you have any problems or questions after your procedure. Follow these instructions at home:  Rest at home for 1-2 days or as told by your doctor.  Have someone stay with you for at least 24 hours.  Do not do these things in the first 24 hours: ? Drive. ? Use machinery. ? Take care of other people. ? Sign legal documents. ? Take a bath or shower.  There are many different ways to close and cover a cut (incision). For example, a cut can be closed with stitches, skin glue, or adhesive strips. Follow your doctor's instructions on: ? Taking care of your cut. ? Changing and removing your bandage (dressing). ? Removing whatever was used to close your cut.  Do not drink alcohol in the first week.  Do not lift more than 5 pounds or play contact sports for the first 2 weeks.  Take medicines only as told by your doctor. For 1 week, do not take medicine that has aspirin in it or medicines like ibuprofen.  Get your test results. Contact a doctor if:  A cut bleeds and leaves more than just a small spot of blood.  A cut is red, puffs up (swells), or hurts more than before.  Fluid or something else comes from a cut.  A cut smells bad.  You have a fever or chills. Get help right away if:  You have swelling, bloating, or pain in your belly (abdomen).  You get dizzy or faint.  You have a rash.  You feel sick to your stomach (nauseous) or throw up (vomit).  You have trouble breathing, feel short of breath, or feel faint.  Your chest hurts.  You have problems talking or seeing.  You have trouble balancing or moving your arms or legs. This information is not intended to replace advice given to you by your health care provider. Make sure you discuss any  questions you have with your health care provider. Document Released: 01/15/2008 Document Revised: 09/13/2015 Document Reviewed: 06/03/2013 Elsevier Interactive Patient Education  2018 [place]. Moderate Conscious Sedation, Adult, Care After These instructions provide you with information about caring for yourself after your procedure. Your health care provider may also give you more specific instructions. Your treatment has been planned according to current medical practices, but problems sometimes occur. Call your health care provider if you have any problems or questions after your procedure. What can I expect after the procedure? After your procedure, it is common:  To feel sleepy for several hours.  To feel clumsy and have poor balance for several hours.  To have poor judgment for several hours.  To vomit if you eat too soon.  Follow these instructions at home: For at least 24 hours after the procedure:   Do not: ? Participate in activities where you could fall or become injured. ? Drive. ? Use heavy machinery. ? Drink alcohol. ? Take sleeping pills or medicines that cause drowsiness. ? Make important decisions or sign legal documents. ? Take care of children on your own.  Rest. Eating and drinking  Follow the diet recommended by your health care provider.  If you vomit: ? Drink water, juice, or soup when you can drink without vomiting. ? Make sure you have little or no nausea before eating solid  foods. General instructions  Have a responsible adult stay with you until you are awake and alert.  Take over-the-counter and prescription medicines only as told by your health care provider.  If you smoke, do not smoke without supervision.  Keep all follow-up visits as told by your health care provider. This is important. Contact a health care provider if:  You keep feeling nauseous or you keep vomiting.  You feel light-headed.  You develop a rash.  You have a  fever. Get help right away if:  You have trouble breathing. This information is not intended to replace advice given to you by your health care provider. Make sure you discuss any questions you have with your health care provider. Document Released: 01/26/2013 Document Revised: 09/10/2015 Document Reviewed: 07/28/2015 Elsevier Interactive Patient Education  [employer].

## 2017-04-06 NOTE — Progress Notes (Signed)
Up and walked and tolerated well

## 2017-04-06 NOTE — Procedures (Signed)
Pre Procedure Dx: Liver lesion  Post Procedural Dx: Same  Technically successful US guided biopsy of indeterminate lesion within the right lobe of the liver.   EBL: None  No immediate complications.   Catherine Joshiah Traynham, MD Pager #: [phone]    

## 2017-04-06 NOTE — Sedation Documentation (Signed)
Patient denies pain and is resting comfortably.  

## 2017-04-06 NOTE — H&P (Signed)
Chief Complaint: Patient was seen in consultation today for liver lesion biopsy at the request of Ennever,Peter R  Referring Physician(s): Ennever,Peter R  Supervising Physician: Sandi Mariscal  Patient Status: [facility] - Out-pt  History of Present Illness: [name] is a 83 y.o. female   Hx Melanoma Previous smoker Uses Eliquis for Afib---last dose Fri 12/14 Recent SOB and dizziness CXR revealed Hilar LAN and RUL mass Pet 03/23/17: IMPRESSION: 1. Right lower lobe lung mass is hypermetabolic compatible with primary bronchogenic carcinoma. Evidence of right hilar and sub- carinal lymph node metastasis and liver metastasis. Assuming non-small cell histology imaging findings are compatible with T2aN2M1b disease.  Request for liver lesion biopsy Approved per Dr Earleen Newport Scheduled now for same   Past Medical History:  Diagnosis Date  . Anxiety   . Arthritis   . Atrial fibrillation ([place])   . Cardiac arrhythmia due to congenital heart disease   . Chronic constipation   . COPD (chronic obstructive pulmonary disease) ([place])   . Depression   . GERD (gastroesophageal reflux disease)   . Hemorrhoid   . Hyperlipidemia   . Idiopathic hypotension   . Lightheadedness 11/2015  . Mass of lower lobe of right lung 03/25/2017  . Melanoma of skin ([place]) 12/06/2016  . Mycotic toenails 10/27/2012  . Parkinson disease ([place])   . Recurrent UTI   . 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 Varicose veins     Past Surgical History:  Procedure Laterality Date  . APPENDECTOMY  83 years old  . COLON RESECTION  2008  . melanoma removal Left    left cheek  . vitriectomy  08-2015    Allergies: Adhesive [tape]; Amoxicillin; Ciprofloxacin; and Ultram [tramadol]  Medications: Prior to Admission medications   Medication Sig Start Date End Date Taking? Authorizing Provider  acetaminophen (TYLENOL) 325 MG tablet Take 2 tablets (650 mg total) by mouth every 6 (six) hours as needed for mild  pain (or Fever >/= 101). 08/29/16   Patrecia Pour, MD  albuterol (VENTOLIN HFA) 108 (90 BASE) MCG/ACT inhaler Inhale 2 puffs into the lungs 2 (two) times daily as needed for wheezing or shortness of breath. 02/05/15   Midge Minium, MD  ALPRAZolam Duanne Moron) 0.5 MG tablet Take 0.5-1 tablets (0.25-0.5 mg total) by mouth 2 (two) times daily as needed for anxiety. 01/19/17   Colon Branch, MD  apixaban (ELIQUIS) 5 MG TABS tablet Take 5 mg by mouth 2 (two) times daily.    [provider]  Ascorbic Acid (VITAMIN C) 1000 MG tablet Take 1,000 mg by mouth daily.    [provider]  azelastine (ASTELIN) 0.1 % nasal spray Place 2 sprays into both nostrils at bedtime as needed for rhinitis. Use in each nostril as directed 07/14/16   Saguier, Percell Miller, PA-C  budesonide-formoterol [facility]) 160-4.5 MCG/ACT inhaler Inhale 2 puffs into the lungs 2 (two) times daily. 12/05/16   Colon Branch, MD  cholecalciferol (VITAMIN D) 1000 units tablet Take 1,000 Units by mouth daily.    [provider]  diltiazem (CARDIZEM) 30 MG tablet Take 1 tablet (30 mg total) 4 (four) times daily by mouth. Patient taking differently: Take 30 mg by mouth 3 (three) times daily.  03/03/17   Colon Branch, MD  DULoxetine (CYMBALTA) 30 MG capsule Take 1 capsule (30 mg total) daily by mouth. 03/06/17   Colon Branch, MD  ipratropium-albuterol (DUONEB) 0.5-2.5 (3) MG/3ML SOLN Take 3 mLs by nebulization every 6 (six) hours  as needed (Dyspnea/wheezing).    [provider]  polyethylene glycol powder (GLYCOLAX/MIRALAX) powder Take 17 g by mouth 2 (two) times daily. Patient taking differently: Take 17 g by mouth 2 (two) times daily as needed (for constipation.).  05/28/16   Waynetta Pean, PA-C  Propylene Glycol (SYSTANE BALANCE OP) Apply 2 drops to eye 3 (three) times daily as needed (for dry/irritated eyes).     [provider]     Family History  Problem Relation Age of Onset  . Asthma Brother   . Alcohol  abuse Brother   . Throat cancer Father   . Alcohol abuse Father   . Lupus Daughter   . Bipolar disorder Daughter   . Lupus Daughter   . Anxiety disorder Daughter   . Alcohol abuse Sister   . Alcohol abuse Maternal Grandfather   . Alcohol abuse Paternal Grandmother   . Breast cancer Sister   . Colon cancer Neg Hx     Social History   Socioeconomic History  . Marital status: Widowed    Spouse name: None  . Number of children: 2  . Years of education: None  . Highest education level: None  Social Needs  . Financial resource strain: None  . Food insecurity - worry: None  . Food insecurity - inability: None  . Transportation needs - medical: None  . Transportation needs - non-medical: None  Occupational History  . Occupation: retiredFirefighter , Animator  Tobacco Use  . Smoking status: Former Smoker    Packs/day: 2.00    Years: 30.00    Pack years: 60.00    Types: Cigarettes    Last attempt to quit: 04/21/1980    Years since quitting: 36.9  . Smokeless tobacco: Never Used  . Tobacco comment: onset age 83 -89, up to > 1ppd (almost 2 ppd)  Substance and Sexual Activity  . Alcohol use: No    Alcohol/week: 0.0 oz  . Drug use: No  . Sexual activity: No  Other Topics Concern  . None  Social History Narrative   Widowed, lives alone. 1 child in [place] and 1 in [place]. 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   Still drives as of 24-58    Review of Systems: A 12 point ROS discussed and pertinent positives are indicated in the HPI above.  All other systems are negative.  Review of Systems  Constitutional: Negative for activity change and fatigue.  Respiratory: Positive for shortness of breath.   Gastrointestinal: Negative for abdominal pain.  Neurological: Positive for weakness.  Psychiatric/Behavioral: Negative for behavioral problems and confusion.    Vital Signs: BP (!) 141/93 (BP Location: Right Arm)   Pulse (!) 109   Temp (!) 97.4 F  (36.3 C) (Oral)   Ht 5\' 11"  (1.803 m)   Wt 154 lb (69.9 kg)   SpO2 98%   BMI 21.48 kg/m   Physical Exam  Constitutional: She is oriented to person, place, and time.  Cardiovascular: Normal rate, regular rhythm and normal heart sounds.  Pulmonary/Chest: Effort normal and breath sounds normal. She has no wheezes.  Abdominal: Soft. Bowel sounds are normal. There is no tenderness.  Musculoskeletal: Normal range of motion.  Neurological: She is alert and oriented to person, place, and time.  Skin: Skin is warm and dry.  Psychiatric: She has a normal mood and affect. Her behavior is normal. Judgment and thought content normal.  Nursing note and vitals reviewed.  Imaging: Dg Chest 2 View  Result Date: 03/16/2017 CLINICAL DATA:  Chronic dyspnea. EXAM: CHEST  2 VIEW COMPARISON:  Radiographs of Aug 26, 2016. FINDINGS: Stable cardiomediastinal silhouette. Atherosclerosis of thoracic aorta is noted. No pneumothorax or pleural effusion is noted. There is interval development of probable right infrahilar mass. Mild left basilar subsegmental atelectasis is noted. Bony thorax unremarkable. IMPRESSION: Interval development of probable right infrahilar mass. CT scan of the chest is recommended for further evaluation. These results will be called to the ordering clinician or representative by the Radiologist Assistant, and communication documented in the PACS or zVision Dashboard. Electronically Signed   By: Marijo Conception, M.D.   On: 03/16/2017 16:31   Ct Head Wo Contrast  Result Date: 03/16/2017 CLINICAL DATA:  83 year old female with dizziness for 3 weeks. No recent falls or head injury. Initial encounter. EXAM: CT HEAD WITHOUT CONTRAST TECHNIQUE: Contiguous axial images were obtained from the base of the skull through the vertex without intravenous contrast. COMPARISON:  11/24/2015 MR.  11/22/2015 CT. FINDINGS: Brain: No intracranial hemorrhage or CT evidence of large acute infarct. Prominent chronic  microvascular changes. Global atrophy. No intracranial mass lesion noted on this unenhanced exam. Vascular: Vascular calcifications. Skull: Negative Sinuses/Orbits: No acute orbital abnormality. Visualized paranasal sinuses are clear. Other: Mastoid air cells and middle ear cavities are clear. IMPRESSION: No intracranial hemorrhage or CT evidence of large acute infarct. Prominent chronic microvascular changes. Atrophy. Electronically Signed   By: Genia Del M.D.   On: 03/16/2017 16:30   Ct Chest Wo Contrast  Result Date: 03/17/2017 CLINICAL DATA:  83 year old with a right infrahilar lesion on recent chest radiograph. EXAM: CT CHEST WITHOUT CONTRAST TECHNIQUE: Multidetector CT imaging of the chest was performed following the standard protocol without IV contrast. COMPARISON:  03/16/2017 and abdominal CTs from 01/19/2015 and 05/28/2016 FINDINGS: Cardiovascular: Mid ascending thoracic aorta is enlarged measuring 4.3 cm. There are coronary artery calcifications. Calcifications at the aortic arch. Heart size is mildly enlarged. No significant pericardial fluid. Mediastinum/Nodes: Enlarged right infrahilar lymph node on sequence 2, image 86 measuring 2.4 cm in the short axis. Probable right hilar lymph node measuring 1.4 cm on sequence 2, image 94. Evaluation for lymphadenopathy is limited on this noncontrast examination. No significant mediastinal lymphadenopathy. No axillary lymphadenopathy. No suspicious supraclavicular lymphadenopathy. Thyroid tissue is mildly heterogeneous. Right hilar node on sequence 2, image 75 measures 1.8 cm in the short axis. Lungs/Pleura: Trachea and mainstem bronchi are patent. There is a mass in the medial right lower lobe on sequence 3, image 104 that roughly measures 3.1 x 3.0 x 4.2 cm. Punctate nodule in the right lower lobe on sequence 3, image 125 is nonspecific. Punctate pleural-based nodule in the right lower lobe on image 121. Indeterminate pulmonary nodule in the right upper  lobe measuring 0.3 cm on sequence 3, image 41. This 0.3 cm nodule may be partially calcified. Small pleural-based reticular densities in the upper lungs are suggestive for chronic changes and possibly mild scarring or fibrosis. Punctate nodule in left upper lobe on sequence 3, image 36. Punctate nodule in left upper lobe on image 77. Lingular tiny nodule on image 107. Small irregular nodule in left lower lobe measuring up to 0.6 cm on sequence 3, image 128. This 0.6 cm nodule has minimally changed since an abdominal CT on 01/19/2015. No significant pleural fluid. Upper Abdomen: Images of the upper abdomen are unremarkable. Musculoskeletal: Dextroscoliosis in the thoracic spine. Mild compression deformity at L1 appears chronic based on a  CT from 06/17/2016. No suspicious bone findings. IMPRESSION: 4.2 cm mass in the medial right lower lobe with adjacent right hilar lymphadenopathy. Findings are highly concerning for a primary lung neoplasm with right hilar lymphadenopathy. Consider further characterization with a PET-CT. Aneurysm of the ascending thoracic aorta measuring up to 4.3 cm. Recommend annual imaging followup by CTA or MRA. This recommendation follows 2010 ACCF/AHA/AATS/ACR/ASA/SCA/SCAI/SIR/STS/SVM Guidelines for the Diagnosis and Management of Patients with Thoracic Aortic Disease. Circulation. 2010; 121: N277-O242 Numerous small pulmonary nodules throughout both lungs. Most of these nodules are indeterminate. Largest nodule in left lower lobe measures 0.6 cm and minimally changed since 2016. Aortic Atherosclerosis (ICD10-I70.0). Coronary artery calcifications. These results will be called to the ordering clinician or representative by the Radiologist Assistant, and communication documented in the PACS or zVision Dashboard. Electronically Signed   By: Markus Daft M.D.   On: 03/17/2017 13:55   Nm Pet Image Initial (pi) Skull Base To Thigh  Result Date: 03/23/2017 CLINICAL DATA:  Initial Treatment strategy  for pulmonary nodules. EXAM: NUCLEAR MEDICINE PET SKULL BASE TO THIGH TECHNIQUE: 7.5 mCi F-18 FDG was injected intravenously. Full-ring PET imaging was performed from the skull base to thigh after the radiotracer. CT data was obtained and used for attenuation correction and anatomic localization. FASTING BLOOD GLUCOSE:  Value: 80 mg/dl COMPARISON:  None. FINDINGS: NECK: No hypermetabolic lymph nodes in the neck. CHEST: Mild cardiac enlargement. Aortic atherosclerosis. Calcification in the LAD and left circumflex and RCA coronary arteries noted. No pericardial effusion. Hypermetabolic sub- carinal node measures 1.4 cm and has an SUV max equal to 9.0. Hypermetabolic right hilar lymph nodes measures 1.8 cm and has an SUV max equal to 15.8. 9 mm posterior mediastinal lymph node is hypermetabolic with an SUV max equal to 6.7. Pulmonary nodule within the medial right lower lobe measures 3.5 cm and has an SUV max equal to 23.59.Small scattered pulmonary nodules in both lungs are too small to reliably characterize. Index nodule within the right upper lobe measures 3 mm, image 19 of series 8. ABDOMEN/PELVIS: 2 hypermetabolic lesions identified within the liver. The largest is along the posterior right lobe measuring 1.5 cm within SUV max equal to 7.9. Anterior right lobe of liver lesion measures 1 cm and has an SUV max equal to 6.2. No abnormal uptake within the pancreas or spleen. The adrenal glands are unremarkable. Left pelvic kidney. Right kidney appears normal. Aortic atherosclerosis. No aneurysm. No upper abdominal or pelvic adenopathy. SKELETON: There is a focal area of increased uptake localizing to the posterior aspect of the right eleventh rib. On the corresponding CT images there is a subacute to chronic rib fracture deformity. IMPRESSION: 1. Right lower lobe lung mass is hypermetabolic compatible with primary bronchogenic carcinoma. Evidence of right hilar and sub- carinal lymph node metastasis and liver  metastasis. Assuming non-small cell histology imaging findings are compatible with T2aN2M1b disease. Electronically Signed   By: Kerby Moors M.D.   On: 03/23/2017 13:23    Labs:  CBC: Recent Labs    12/05/16 1344 02/09/17 1407 03/16/17 1554 03/25/17 1533  WBC 6.3 6.7 6.7 7.1  HGB 12.4 12.2 13.6 12.6  HCT 37.8 37.1 41.5 38.8  PLT 199.0 209.0 214.0 202    COAGS: Recent Labs    08/26/16 2054 08/27/16 0522 08/28/16 0313 08/29/16 0316  APTT 39* 94* 37* 39*    BMP: Recent Labs    05/28/16 1602 08/25/16 0942 08/26/16 0345 09/05/16 03/16/17 1554 03/20/17 0925 03/25/17 1533  NA 137  140 138 147 139 139 147*  K 3.4* 3.3* 3.3* 3.8 5.4* 4.5 4.3  CL 101 100* 103  --  102 102 103  CO2 30 30 27   --  29 32 29  GLUCOSE 133* 101* 91  --  86 63* 95  BUN 12 10 9 13 17 18 16   CALCIUM 8.8* 9.1 8.7*  --  9.9 9.6 9.8  CREATININE 0.74 0.70 0.68 0.7 0.95 0.91 1.1  GFRNONAA >60 >60 >60  --   --   --   --   GFRAA >60 >60 >60  --   --   --   --     LIVER FUNCTION TESTS: Recent Labs    08/26/16 0345 09/05/16 03/16/17 1554 03/20/17 0925 03/25/17 1533  BILITOT 1.4*  --  0.5 0.6 0.60  AST 21 22 18 17 26   ALT 16 23 12 13 20   ALKPHOS 68 98 85 80 89*  PROT 5.8*  --  7.1 6.9 7.0  ALBUMIN 3.2*  --  4.1 4.0 3.6    TUMOR MARKERS: No results for input(s): AFPTM, CEA, CA199, CHROMGRNA in the last 8760 hours.  Assessment and Plan:  Hx Melanoma New hilar Lymphadenopathy; Right lung mass And +PET liver lesions Scheduled now for liver lesion biopsy Off Eliquis Risks and benefits discussed with the patient including, but not limited to bleeding, infection, damage to adjacent structures or low yield requiring additional tests. All of the patient's questions were answered, patient is agreeable to proceed. Consent signed and in chart.   Thank you for this interesting consult.  I greatly enjoyed meeting [name] and look forward to participating in their care.  A copy of this  report was sent to the requesting provider on this date.  Electronically Signed: Lavonia Drafts, PA-C 04/06/2017, 11:54 AM   I spent a total of  30 Minutes   in face to face in clinical consultation, greater than 50% of which was counseling/coordinating care for liver lesion biopsy

## 2017-04-07 ENCOUNTER — Telehealth: Payer: Self-pay | Admitting: *Deleted

## 2017-04-07 ENCOUNTER — Ambulatory Visit: Payer: Medicare Other | Admitting: Psychiatry

## 2017-04-07 MED ORDER — PROCHLORPERAZINE MALEATE 10 MG PO TABS: 10.0000 mg | ORAL_TABLET | Freq: Four times a day (QID) | ORAL | 0 refills | Status: DC | PRN

## 2017-04-07 MED ORDER — ONDANSETRON HCL 8 MG PO TABS: 8.0000 mg | ORAL_TABLET | Freq: Three times a day (TID) | ORAL | 0 refills | Status: AC | PRN

## 2017-04-07 NOTE — Telephone Encounter (Signed)
Patient had a liver biopsy yesterday. The family would like to know what the next step is as far as appointments, possible need for further biopsy and treatment. The patient is also c/o nausea and decreased appetite and the family would like to know if there is anything that can help.  Reviewed with Dr Marin Olp. He states that right now, we need to wait for results of biopsy before any decisions can be made about moving forward. He expects some results within the next 24hrs.  He will also send in a prescription to help with the nausea. If this doesn't work, he may prescribe something to stimulate her appetite.

## 2017-04-10 ENCOUNTER — Other Ambulatory Visit: Payer: Self-pay | Admitting: Family

## 2017-04-10 ENCOUNTER — Other Ambulatory Visit (HOSPITAL_BASED_OUTPATIENT_CLINIC_OR_DEPARTMENT_OTHER): Payer: Medicare Other

## 2017-04-10 ENCOUNTER — Ambulatory Visit (HOSPITAL_BASED_OUTPATIENT_CLINIC_OR_DEPARTMENT_OTHER): Payer: Medicare Other | Admitting: Hematology & Oncology

## 2017-04-10 ENCOUNTER — Encounter: Payer: Self-pay | Admitting: Hematology & Oncology

## 2017-04-10 ENCOUNTER — Other Ambulatory Visit: Payer: Self-pay

## 2017-04-10 DIAGNOSIS — C7B8 Other secondary neuroendocrine tumors: Secondary | ICD-10-CM

## 2017-04-10 DIAGNOSIS — C7A8 Other malignant neuroendocrine tumors: Secondary | ICD-10-CM | POA: Diagnosis not present

## 2017-04-10 DIAGNOSIS — R918 Other nonspecific abnormal finding of lung field: Secondary | ICD-10-CM | POA: Diagnosis present

## 2017-04-10 DIAGNOSIS — C3490 Malignant neoplasm of unspecified part of unspecified bronchus or lung: Secondary | ICD-10-CM

## 2017-04-10 LAB — CBC WITH DIFFERENTIAL (CANCER CENTER ONLY)
BASO#: 0 10*3/uL (ref 0.0–0.2)
BASO%: 0.2 % (ref 0.0–2.0)
EOS%: 1.5 % (ref 0.0–7.0)
Eosinophils Absolute: 0.1 10*3/uL (ref 0.0–0.5)
HEMATOCRIT: 38.7 % (ref 34.8–46.6)
HEMOGLOBIN: 12.7 g/dL (ref 11.6–15.9)
LYMPH#: 1.6 10*3/uL (ref 0.9–3.3)
LYMPH%: 24.8 % (ref 14.0–48.0)
MCH: 32.2 pg (ref 26.0–34.0)
MCHC: 32.8 g/dL (ref 32.0–36.0)
MCV: 98 fL (ref 81–101)
MONO#: 0.7 10*3/uL (ref 0.1–0.9)
MONO%: 10.3 % (ref 0.0–13.0)
NEUT%: 63.2 % (ref 39.6–80.0)
NEUTROS ABS: 4.2 10*3/uL (ref 1.5–6.5)
Platelets: 171 10*3/uL (ref 145–400)
RBC: 3.95 10*6/uL (ref 3.70–5.32)
RDW: 13.7 % (ref 11.1–15.7)
WBC: 6.6 10*3/uL (ref 3.9–10.0)

## 2017-04-10 LAB — CMP (CANCER CENTER ONLY)
ALBUMIN: 3.7 g/dL (ref 3.3–5.5)
ALK PHOS: 84 U/L (ref 26–84)
ALT: 23 U/L (ref 10–47)
AST: 22 U/L (ref 11–38)
BILIRUBIN TOTAL: 0.8 mg/dL (ref 0.20–1.60)
BUN, Bld: 14 mg/dL (ref 7–22)
CALCIUM: 9.5 mg/dL (ref 8.0–10.3)
CO2: 28 mEq/L (ref 18–33)
Chloride: 107 mEq/L (ref 98–108)
Creat: 1 mg/dl (ref 0.6–1.2)
GLUCOSE: 83 mg/dL (ref 73–118)
POTASSIUM: 3.6 meq/L (ref 3.3–4.7)
Sodium: 147 mEq/L — ABNORMAL HIGH (ref 128–145)
Total Protein: 6.9 g/dL (ref 6.4–8.1)

## 2017-04-10 LAB — LACTATE DEHYDROGENASE: LDH: 246 U/L — ABNORMAL HIGH (ref 125–245)

## 2017-04-13 NOTE — Progress Notes (Signed)
Hematology and Oncology Follow Up Visit  RAYLEN KEN [id number] September 29, 1927 83 y.o. 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 Culley is back for follow-up.  We now have a diagnosis.  She had a liver biopsy on December 17.  The pathology report ([id number]) showed metastatic lung cancer.  Standing seem to be consistent with a large cell neuroendocrine cancer.  She had a PET scan done.  It showed the mass in the right lower lung.  She had hilar and subcarinal metastasis and liver metastasis.  Also noted was some activity in a right 11th rib.  I am having pathology send the biopsy off for genetic analysis, including PD-L1.  She is still in great shape.  She comes in with 1 of her daughters.  I explained to them that this kind of cancer is not that common.  As such, I want to make sure that we look at all possibilities.  The only way to do this is to have the genetic mutation analysis, and PD-L1 result back.  Given the fact that she is 83 years old, I would hate to use chemotherapy.  She is eating well.  She is having no pain.  She is having no cough or shortness of breath.  There is no nausea or vomiting.  Is no change in bowel or bladder habits.  Medications:  Current Outpatient Medications:  .  acetaminophen (TYLENOL) 325 MG tablet, Take 2 tablets (650 mg total) by mouth every 6 (six) hours as needed for mild pain (or Fever >/= 101)., Disp: , Rfl:  .  albuterol (VENTOLIN HFA) 108 (90 BASE) MCG/ACT inhaler, Inhale 2 puffs into the lungs 2 (two) times daily as needed for wheezing or shortness of breath., Disp: 18 g, Rfl: 4 .  ALPRAZolam (XANAX) 0.5 MG tablet, Take 0.5-1 tablets (0.25-0.5 mg total) by mouth 2 (two) times daily as needed for anxiety., Disp: 30 tablet, Rfl: 1 .  apixaban (ELIQUIS) 5 MG TABS  tablet, Take 5 mg by mouth 2 (two) times daily., Disp: , Rfl:  .  Ascorbic Acid (VITAMIN C) 1000 MG tablet, Take 1,000 mg by mouth daily., Disp: , Rfl:  .  azelastine (ASTELIN) 0.1 % nasal spray, Place 2 sprays into both nostrils at bedtime as needed for rhinitis. Use in each nostril as directed, Disp: 30 mL, Rfl: 3 .  budesonide-formoterol (SYMBICORT) 160-4.5 MCG/ACT inhaler, Inhale 2 puffs into the lungs 2 (two) times daily., Disp: 30.6 g, Rfl: 1 .  cholecalciferol (VITAMIN D) 1000 units tablet, Take 1,000 Units by mouth daily., Disp: , Rfl:  .  diltiazem (CARDIZEM) 30 MG tablet, Take 1 tablet (30 mg total) 4 (four) times daily by mouth. (Patient taking differently: Take 30 mg by mouth 3 (three) times daily. ), Disp: 360 tablet, Rfl: 0 .  DULoxetine (CYMBALTA) 30 MG capsule, Take 1 capsule (30 mg total) daily by mouth., Disp: 90 capsule, Rfl: 0 .  ipratropium-albuterol (DUONEB) 0.5-2.5 (3) MG/3ML SOLN, Take 3 mLs by nebulization every 6 (six) hours as needed (Dyspnea/wheezing)., Disp: , Rfl:  .  ondansetron (ZOFRAN) 8 MG tablet, Take 1 tablet (8 mg total) by mouth every 8 (eight) hours as needed for nausea or vomiting., Disp: 30 tablet, Rfl: 0 .  polyethylene glycol powder (GLYCOLAX/MIRALAX) powder, Take 17 g by mouth 2 (two)  times daily. (Patient taking differently: Take 17 g by mouth 2 (two) times daily as needed (for constipation.). ), Disp: 255 g, Rfl: 0 .  prochlorperazine (COMPAZINE) 10 MG tablet, Take 1 tablet (10 mg total) by mouth every 6 (six) hours as needed for nausea or vomiting., Disp: 30 tablet, Rfl: 0 .  Propylene Glycol (SYSTANE BALANCE OP), Apply 2 drops to eye 3 (three) times daily as needed (for dry/irritated eyes). , Disp: , Rfl:   Allergies:  Allergies  Allergen Reactions  . Adhesive [Tape] Other (See Comments)    Caused blisters  . Amoxicillin Other (See Comments)    Has patient had a PCN reaction causing immediate rash, facial/tongue/throat swelling, SOB or  lightheadedness with hypotension: Yes Has patient had a PCN reaction causing severe rash involving mucus membranes or skin necrosis: No Has patient had a PCN reaction that required hospitalization No Has patient had a PCN reaction occurring within the last 10 years: Yes If all of the above answers are "NO", then may proceed with Cephalosporin use.   Caused thrush  . Ciprofloxacin Other (See Comments)    Caused thrush  . Ultram [Tramadol] Other (See Comments)    hypotension    Past Medical History, Surgical history, Social history, and Family History were reviewed and updated.  Review of Systems: Review of Systems  Constitutional: Negative for appetite change, fatigue, fever and unexpected weight change.  HENT:   Negative for lump/mass, mouth sores, sore throat and trouble swallowing.   Respiratory: Negative for cough, hemoptysis and shortness of breath.   Cardiovascular: Negative for leg swelling and palpitations.  Gastrointestinal: Negative for abdominal distention, abdominal pain, blood in stool, constipation, diarrhea, nausea and vomiting.  Genitourinary: Negative for bladder incontinence, dysuria, frequency and hematuria.   Musculoskeletal: Negative for arthralgias, back pain, gait problem and myalgias.  Skin: Negative for itching and rash.  Neurological: Negative for dizziness, extremity weakness, gait problem, headaches, numbness, seizures and speech difficulty.  Hematological: Does not bruise/bleed easily.  Psychiatric/Behavioral: Negative for depression and sleep disturbance. The patient is not nervous/anxious.     Physical Exam:  weight is 150 lb (68 kg). Her oral temperature is 98.4 F (36.9 C). Her blood pressure is 113/80 and her pulse is 72. Her respiration is 18 and oxygen saturation is 99%.   Wt Readings from Last 3 Encounters:  04/10/17 150 lb (68 kg)  04/06/17 154 lb (69.9 kg)  03/25/17 154 lb 12.8 oz (70.2 kg)    Physical Exam  Constitutional: She is  oriented to person, place, and time.  HENT:  Head: Normocephalic and atraumatic.  Mouth/Throat: Oropharynx is clear and moist.  Eyes: EOM are normal. Pupils are equal, round, and reactive to light.  Neck: Normal range of motion.  Cardiovascular: Normal rate, regular rhythm and normal heart sounds.  Pulmonary/Chest: Effort normal and breath sounds normal.  Abdominal: Soft. Bowel sounds are normal.  Musculoskeletal: Normal range of motion. She exhibits no edema, tenderness or deformity.  Lymphadenopathy:    She has no cervical adenopathy.  Neurological: She is alert and oriented to person, place, and time.  Skin: Skin is warm and dry. No rash noted. No erythema.  Psychiatric: She has a normal mood and affect. Her behavior is normal. Judgment and thought content normal.  Vitals reviewed.    Lab Results  Component Value Date   WBC 6.6 04/10/2017   HGB 12.7 04/10/2017   HCT 38.7 04/10/2017   MCV 98 04/10/2017   PLT 171 04/10/2017  Chemistry      Component Value Date/Time   NA 147 (H) 04/10/2017 1141   K 3.6 04/10/2017 1141   CL 107 04/10/2017 1141   CO2 28 04/10/2017 1141   BUN 14 04/10/2017 1141   CREATININE 1.0 04/10/2017 1141   GLU 68 09/05/2016      Component Value Date/Time   CALCIUM 9.5 04/10/2017 1141   ALKPHOS 84 04/10/2017 1141   AST 22 04/10/2017 1141   ALT 23 04/10/2017 1141   BILITOT 0.80 04/10/2017 1141         Impression and Plan: Ms. Yard is a 83 year old white female.  She has metastatic bronchogenic carcinoma.  This is a non-small cell lung cancer.  Again, we really need to see what the genetic markers will show and the PD-L1.  Given that she has not smoked for 35 years, may be we will find a genetic mutation that we can target.  I spoke to she and her daughter for about 45 minutes.  I explained why we really have to wait for this test to come back.  She still is pretty much asymptomatic.  As such, we do not have to rush in with  treatment.  If she does need treatment, we might have to go with single agent therapy.  Once we get the results back from our studies, then we will get her back.   Volanda Napoleon, MD 12/24/201810:05 AM

## 2017-04-15 DIAGNOSIS — R0602 Shortness of breath: Secondary | ICD-10-CM | POA: Diagnosis not present

## 2017-04-15 DIAGNOSIS — I480 Paroxysmal atrial fibrillation: Secondary | ICD-10-CM | POA: Diagnosis not present

## 2017-04-15 DIAGNOSIS — I951 Orthostatic hypotension: Secondary | ICD-10-CM | POA: Diagnosis not present

## 2017-04-15 DIAGNOSIS — G20 Parkinson's disease: Secondary | ICD-10-CM | POA: Diagnosis not present

## 2017-04-16 ENCOUNTER — Ambulatory Visit (HOSPITAL_COMMUNITY)
Admission: RE | Admit: 2017-04-16 | Discharge: 2017-04-16 | Disposition: A | Discharge status: Home / Self Care | Payer: Medicare Other | Source: Ambulatory Visit | Attending: Family | Admitting: Family

## 2017-04-16 ENCOUNTER — Other Ambulatory Visit: Payer: Self-pay | Admitting: *Deleted

## 2017-04-16 DIAGNOSIS — I6782 Cerebral ischemia: Secondary | ICD-10-CM | POA: Insufficient documentation

## 2017-04-16 DIAGNOSIS — C3490 Malignant neoplasm of unspecified part of unspecified bronchus or lung: Secondary | ICD-10-CM | POA: Diagnosis not present

## 2017-04-16 DIAGNOSIS — I6789 Other cerebrovascular disease: Secondary | ICD-10-CM | POA: Diagnosis not present

## 2017-04-16 MED ORDER — GADOBENATE DIMEGLUMINE 529 MG/ML IV SOLN
15.0000 mL | Freq: Once | INTRAVENOUS | Status: AC | PRN
  Administered 2017-04-16: 14 mL via INTRAVENOUS

## 2017-04-16 MED ORDER — PROCHLORPERAZINE MALEATE 10 MG PO TABS: 10.0000 mg | ORAL_TABLET | Freq: Four times a day (QID) | ORAL | 0 refills | Status: DC | PRN

## 2017-04-17 ENCOUNTER — Telehealth: Payer: Self-pay | Admitting: *Deleted

## 2017-04-17 NOTE — Telephone Encounter (Signed)
-----   Message from Volanda Napoleon, MD sent at 04/17/2017  7:12 AM EST ----- Call - MRI of the brain is normal!!!  No cancer!! Laurey Arrow

## 2017-04-24 ENCOUNTER — Encounter: Payer: Self-pay | Admitting: Podiatry

## 2017-04-24 ENCOUNTER — Ambulatory Visit (INDEPENDENT_AMBULATORY_CARE_PROVIDER_SITE_OTHER): Payer: Medicare Other | Admitting: Podiatry

## 2017-04-24 ENCOUNTER — Encounter: Payer: Self-pay | Admitting: Hematology & Oncology

## 2017-04-24 ENCOUNTER — Ambulatory Visit (HOSPITAL_BASED_OUTPATIENT_CLINIC_OR_DEPARTMENT_OTHER): Payer: Medicare Other | Admitting: Hematology & Oncology

## 2017-04-24 ENCOUNTER — Other Ambulatory Visit: Payer: Self-pay

## 2017-04-24 DIAGNOSIS — C7B8 Other secondary neuroendocrine tumors: Secondary | ICD-10-CM

## 2017-04-24 DIAGNOSIS — C7951 Secondary malignant neoplasm of bone: Secondary | ICD-10-CM

## 2017-04-24 DIAGNOSIS — C7A8 Other malignant neuroendocrine tumors: Secondary | ICD-10-CM

## 2017-04-24 DIAGNOSIS — M79676 Pain in unspecified toe(s): Secondary | ICD-10-CM | POA: Diagnosis not present

## 2017-04-24 DIAGNOSIS — C787 Secondary malignant neoplasm of liver and intrahepatic bile duct: Secondary | ICD-10-CM

## 2017-04-24 DIAGNOSIS — C3492 Malignant neoplasm of unspecified part of left bronchus or lung: Secondary | ICD-10-CM | POA: Insufficient documentation

## 2017-04-24 DIAGNOSIS — B351 Tinea unguium: Secondary | ICD-10-CM

## 2017-04-24 DIAGNOSIS — C3490 Malignant neoplasm of unspecified part of unspecified bronchus or lung: Secondary | ICD-10-CM

## 2017-04-24 HISTORY — DX: Malignant neoplasm of unspecified part of unspecified bronchus or lung: C34.90

## 2017-04-24 HISTORY — DX: Malignant neoplasm of unspecified part of left bronchus or lung: C34.92

## 2017-04-24 NOTE — Progress Notes (Addendum)
Patient ID: [name], female   DOB: Aug 30, 1927, 84 y.o.   MRN: [id number] Complaint:  Visit Type: Patient returns to my office for continued preventative foot care services. Complaint: Patient states" my nails have grown long and thick and become painful to walk and wear shoes" . She presents for preventative foot care services. No changes to ROS.  She says she has been hospitalized with atrial fib and asthma. She presently is taking eliquiss. Patient says she has cancer.  Podiatric Exam: Vascular: dorsalis pedis and posterior tibial pulses are palpable bilateral. Capillary return is immediate. Temperature gradient is WNL. Skin turgor WNL  Sensorium: Normal Semmes Weinstein monofilament test. Normal tactile sensation bilaterally. Nail Exam: Pt has thick disfigured discolored nails with subungual debris noted bilateral entire nail hallux through fifth toenails Ulcer Exam: There is no evidence of ulcer or pre-ulcerative changes or infection. Orthopedic Exam: Muscle tone and strength are WNL. No limitations in general ROM. No crepitus or effusions noted. Foot type and digits show no abnormalities. Bony prominences are unremarkable. Skin: No Porokeratosis. No infection or ulcers  Diagnosis:  Tinea unguium, Pain in right toe, pain in left toes  Treatment & Plan Procedures and Treatment: Consent by patient was obtained for treatment procedures. The patient understood the discussion of treatment and procedures well. All questions were answered thoroughly reviewed. Debridement of mycotic and hypertrophic toenails, 1 through 5 bilateral and clearing of subungual debris. No ulceration, no infection noted. ABN signed for 2019. Return Visit-Office Procedure: Patient instructed to return to the office for a follow up visit 3 months for continued evaluation and treatment.   Gardiner Barefoot DPM

## 2017-04-24 NOTE — Progress Notes (Signed)
START ON PATHWAY REGIMEN - Non-Small Cell Lung     A cycle is every 21 days:     Pembrolizumab      Pemetrexed      Carboplatin   **Always confirm dose/schedule in your pharmacy ordering system**    Patient Characteristics: Stage IV Metastatic, Nonsquamous, Initial Chemotherapy/Immunotherapy, PS = 0, 1, PD-L1 Expression Positive 1-49% (TPS) / Negative / Not Tested / Awaiting Test Results AJCC T Category: T4 Current Disease Status: Distant Metastases AJCC N Category: N2 AJCC M Category: M1c AJCC 8 Stage Grouping: IVB Histology: Nonsquamous Cell ROS1 Rearrangement Status: Negative T790M Mutation Status: Not Applicable - EGFR Mutation Negative/Unknown Other Mutations/Biomarkers: No Other Actionable Mutations PD-L1 Expression Status: PD-L1 Negative Chemotherapy/Immunotherapy LOT: Initial Chemotherapy/Immunotherapy Molecular Targeted Therapy: Not Appropriate ALK Translocation Status: Negative Would you be surprised if this patient died  in the next year<= I would be surprised if this patient died in the next year EGFR Mutation Status: Quantity Not Sufficient BRAF V600E Mutation Status: Quantity Not Sufficient Performance Status: PS = 0, 1 Intent of Therapy: Non-Curative / Palliative Intent, Discussed with Patient

## 2017-04-24 NOTE — Progress Notes (Signed)
Hematology and Oncology Follow Up Visit  [name] [id number] Dec 25, 1927 84 y.o. 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  We now have a diagnosis.  She had a liver biopsy on December 17.  The pathology report ([id number]) showed metastatic lung cancer.  Standing seem to be consistent with a large cell neuroendocrine cancer.  She had a PET scan done.  It showed the mass in the right lower lung.  She had hilar and subcarinal metastasis and liver metastasis.    I got the results back from her diagnostic testing.  Unfortunately, she does not have an actionable mutation.  She was negative for PD-L1.  Unfortunately, there was not enough material to be able to do other testing.  This seems to be a recurring theme with the biopsies that we are getting these days.  I talked to her at length.  I spent about 45 minutes with her.  Despite her advanced age, she still has a very good performance status.  Her performance status is ECOG 0-1.  I really think that she would be able to tolerate systemic therapy.  I think that she could tolerate combination therapy along with immunotherapy.  We would clearly have to do dosage reduction.  I gave her information sheets about treatment.  I told her again that our goal here was a quality of life.  I felt that if we could treat her, we will get a response and that this would help her quality of life.  Her main problem right now is just feeling tired.  Medications:  Current Outpatient Medications:  .  acetaminophen (TYLENOL) 325 MG tablet, Take 2 tablets (650 mg total) by mouth every 6 (six) hours as needed for mild pain (or Fever >/= 101)., Disp: , Rfl:  .  albuterol (VENTOLIN HFA) 108  (90 BASE) MCG/ACT inhaler, Inhale 2 puffs into the lungs 2 (two) times daily as needed for wheezing or shortness of breath., Disp: 18 g, Rfl: 4 .  ALPRAZolam (XANAX) 0.5 MG tablet, Take 0.5-1 tablets (0.25-0.5 mg total) by mouth 2 (two) times daily as needed for anxiety., Disp: 30 tablet, Rfl: 1 .  apixaban (ELIQUIS) 5 MG TABS tablet, Take 5 mg by mouth 2 (two) times daily., Disp: , Rfl:  .  Ascorbic Acid (VITAMIN C) 1000 MG tablet, Take 1,000 mg by mouth daily., Disp: , Rfl:  .  azelastine (ASTELIN) 0.1 % nasal spray, Place 2 sprays into both nostrils at bedtime as needed for rhinitis. Use in each nostril as directed, Disp: 30 mL, Rfl: 3 .  budesonide-formoterol (SYMBICORT) 160-4.5 MCG/ACT inhaler, Inhale 2 puffs into the lungs 2 (two) times daily., Disp: 30.6 g, Rfl: 1 .  cholecalciferol (VITAMIN D) 1000 units tablet, Take 1,000 Units by mouth daily., Disp: , Rfl:  .  diltiazem (CARDIZEM) 30 MG tablet, Take 1 tablet (30 mg total) 4 (four) times daily by mouth. (Patient taking differently: Take 30 mg by mouth 3 (three) times daily. ), Disp: 360 tablet, Rfl: 0 .  DULoxetine (CYMBALTA) 30 MG capsule, Take 1 capsule (30 mg total) daily by mouth., Disp: 90 capsule, Rfl: 0 .  ipratropium-albuterol (DUONEB) 0.5-2.5 (3) MG/3ML SOLN, Take 3 mLs by nebulization every 6 (six) hours as  needed (Dyspnea/wheezing)., Disp: , Rfl:  .  ondansetron (ZOFRAN) 8 MG tablet, Take 1 tablet (8 mg total) by mouth every 8 (eight) hours as needed for nausea or vomiting., Disp: 30 tablet, Rfl: 0 .  polyethylene glycol powder (GLYCOLAX/MIRALAX) powder, Take 17 g by mouth 2 (two) times daily. (Patient taking differently: Take 17 g by mouth 2 (two) times daily as needed (for constipation.). ), Disp: 255 g, Rfl: 0 .  Propylene Glycol (SYSTANE BALANCE OP), Apply 2 drops to eye 3 (three) times daily as needed (for dry/irritated eyes). , Disp: , Rfl:  .  prochlorperazine (COMPAZINE) 10 MG tablet, Take 1 tablet (10 mg total) by mouth  every 6 (six) hours as needed for nausea or vomiting. (Patient not taking: Reported on 04/24/2017), Disp: 90 tablet, Rfl: 0  Allergies:  Allergies  Allergen Reactions  . Adhesive [Tape] Other (See Comments)    Caused blisters  . Amoxicillin Other (See Comments)    Has patient had a PCN reaction causing immediate rash, facial/tongue/throat swelling, SOB or lightheadedness with hypotension: Yes Has patient had a PCN reaction causing severe rash involving mucus membranes or skin necrosis: No Has patient had a PCN reaction that required hospitalization No Has patient had a PCN reaction occurring within the last 10 years: Yes If all of the above answers are "NO", then may proceed with Cephalosporin use.   Caused thrush  . Ciprofloxacin Other (See Comments)    Caused thrush  . Ultram [Tramadol] Other (See Comments)    hypotension    Past Medical History, Surgical history, Social history, and Family History were reviewed and updated.  Review of Systems: Review of Systems  Constitutional: Negative for appetite change, fatigue, fever and unexpected weight change.  HENT:   Negative for lump/mass, mouth sores, sore throat and trouble swallowing.   Respiratory: Negative for cough, hemoptysis and shortness of breath.   Cardiovascular: Negative for leg swelling and palpitations.  Gastrointestinal: Negative for abdominal distention, abdominal pain, blood in stool, constipation, diarrhea, nausea and vomiting.  Genitourinary: Negative for bladder incontinence, dysuria, frequency and hematuria.   Musculoskeletal: Negative for arthralgias, back pain, gait problem and myalgias.  Skin: Negative for itching and rash.  Neurological: Negative for dizziness, extremity weakness, gait problem, headaches, numbness, seizures and speech difficulty.  Hematological: Does not bruise/bleed easily.  Psychiatric/Behavioral: Negative for depression and sleep disturbance. The patient is not nervous/anxious.      Physical Exam:  weight is 153 lb (69.4 kg). Her oral temperature is 97.5 F (36.4 C) (abnormal). Her blood pressure is 125/73 and her pulse is 101 (abnormal). Her respiration is 20 and oxygen saturation is 97%.   Wt Readings from Last 3 Encounters:  04/24/17 153 lb (69.4 kg)  04/10/17 150 lb (68 kg)  04/06/17 154 lb (69.9 kg)    Physical Exam  Constitutional: She is oriented to person, place, and time.  HENT:  Head: Normocephalic and atraumatic.  Mouth/Throat: Oropharynx is clear and moist.  Eyes: EOM are normal. Pupils are equal, round, and reactive to light.  Neck: Normal range of motion.  Cardiovascular: Normal rate, regular rhythm and normal heart sounds.  Pulmonary/Chest: Effort normal and breath sounds normal.  Abdominal: Soft. Bowel sounds are normal.  Musculoskeletal: Normal range of motion. She exhibits no edema, tenderness or deformity.  Lymphadenopathy:    She has no cervical adenopathy.  Neurological: She is alert and oriented to person, place, and time.  Skin: Skin is warm and dry. No rash noted. No erythema.  Psychiatric: She has a normal mood and affect. Her behavior is normal. Judgment and thought content normal.  Vitals reviewed.    Lab Results  Component Value Date   WBC 6.6 04/10/2017   HGB 12.7 04/10/2017   HCT 38.7 04/10/2017   MCV 98 04/10/2017   PLT 171 04/10/2017     Chemistry      Component Value Date/Time   NA 147 (H) 04/10/2017 1141   K 3.6 04/10/2017 1141   CL 107 04/10/2017 1141   CO2 28 04/10/2017 1141   BUN 14 04/10/2017 1141   CREATININE 1.0 04/10/2017 1141   GLU 68 09/05/2016      Component Value Date/Time   CALCIUM 9.5 04/10/2017 1141   ALKPHOS 84 04/10/2017 1141   AST 22 04/10/2017 1141   ALT 23 04/10/2017 1141   BILITOT 0.80 04/10/2017 1141         Impression and Plan: [name] is a 84 year old white female.  She has metastatic bronchogenic carcinoma.  This is a non-small cell lung cancer.  We will give the  combination of carboplatinum/Alimta/pembrolizumab I.  She has good peripheral access.  I think we could do treatment with a peripheral vein.  I gave her information sheets about each medication.  I answered all of her questions.  I explained to her what I thought would happen with side effects.  I told her that I would think that being tired might be the biggest side effect.  Given that we are dose reducing treatment, I would think that she should do fairly well.  I told her that it is very important for her to maintain her appetite right now.  Her weight is actually gone up 3 pounds which is encouraging.  I did speak with her daughter Kennyth Lose on the phone.  I try to get hold of her other daughter [name] but left a message.  She will need to have chemotherapy teaching.  I will get this set up to be done next week.  Of note, she did have an MRI of the brain.  This did not show any brain metastasis.  After 2 cycles of treatment, we will repeat her PET scan and hopefully will see a response.  If she responds, I would think 6 cycles of treatment and then we could consider maintenance therapy depending on her performance status.  I will plan to get her back to be seen the first day of her second cycle of treatment.  Volanda Napoleon, MD 1/4/20194:22 PM

## 2017-04-27 ENCOUNTER — Telehealth: Payer: Self-pay | Admitting: Pulmonary Disease

## 2017-04-27 ENCOUNTER — Encounter: Payer: Self-pay | Admitting: Hematology & Oncology

## 2017-04-27 NOTE — Telephone Encounter (Signed)
Yes, make appointment with me at next available. OK to continue symbicort during chemotherapy.

## 2017-04-27 NOTE — Telephone Encounter (Signed)
Called and spoke with pt who states that she was diagnosed with stage IV cancer and pt is about to begin chemo.   Pt is wanting to know if she needs to come in for an OV based off of her new diagnosis.  Last OV with PM 02/19/17 states for pt to follow up in 3 months, but pt is asking if she should come in sooner.  Pt is also wanting to know if it is okay for her to continue taking her Symbicort when she is doing her chemo treatments.  Dr. Vaughan Browner, please advise.  Thanks!

## 2017-04-27 NOTE — Telephone Encounter (Signed)
Called pt and scheduled her for an OV with PM 1/15 at 11:45. Told  Pt if she started not feeling good after her chemo treatment 05/04/17 to call us right away to reschedule her appt. Pt expressed understanding.  Also told pt that it was fine for her to continue taking symbicort during chemotherapy. Pt expressed understanding. Nothing further needed.

## 2017-04-28 ENCOUNTER — Encounter: Payer: Self-pay | Admitting: General Practice

## 2017-04-28 ENCOUNTER — Other Ambulatory Visit: Payer: Self-pay | Admitting: *Deleted

## 2017-04-28 ENCOUNTER — Encounter: Payer: Self-pay | Admitting: *Deleted

## 2017-04-28 ENCOUNTER — Inpatient Hospital Stay: Payer: Medicare Other | Attending: Hematology & Oncology

## 2017-04-28 DIAGNOSIS — C7B8 Other secondary neuroendocrine tumors: Secondary | ICD-10-CM | POA: Insufficient documentation

## 2017-04-28 DIAGNOSIS — C787 Secondary malignant neoplasm of liver and intrahepatic bile duct: Secondary | ICD-10-CM

## 2017-04-28 DIAGNOSIS — C7951 Secondary malignant neoplasm of bone: Secondary | ICD-10-CM

## 2017-04-28 DIAGNOSIS — C3490 Malignant neoplasm of unspecified part of unspecified bronchus or lung: Secondary | ICD-10-CM

## 2017-04-28 DIAGNOSIS — Z5112 Encounter for antineoplastic immunotherapy: Secondary | ICD-10-CM | POA: Insufficient documentation

## 2017-04-28 DIAGNOSIS — C3492 Malignant neoplasm of unspecified part of left bronchus or lung: Secondary | ICD-10-CM

## 2017-04-28 DIAGNOSIS — Z5111 Encounter for antineoplastic chemotherapy: Secondary | ICD-10-CM | POA: Insufficient documentation

## 2017-04-28 DIAGNOSIS — C7A8 Other malignant neuroendocrine tumors: Secondary | ICD-10-CM | POA: Insufficient documentation

## 2017-04-28 MED ORDER — FOLIC ACID 1 MG PO TABS: 1.0000 mg | ORAL_TABLET | Freq: Every day | ORAL | 3 refills | Status: AC

## 2017-04-28 MED ORDER — FOLIC ACID 1 MG PO TABS: 1.0000 mg | ORAL_TABLET | Freq: Every day | ORAL | 3 refills | Status: DC

## 2017-04-28 MED ORDER — DEXAMETHASONE 4 MG PO TABS: ORAL_TABLET | ORAL | 1 refills | Status: DC

## 2017-04-28 NOTE — Progress Notes (Signed)
[facility] CSW Progress Note  Patient enrolled in [facility] program - transport arranged for upcoming appointments on 1/14, 2/4 and 2/25.  [facility] will contact patient to confirm arrangements.  Daughter/caregiver can ride w patient.  Edwyna Shell, LCSW Clinical Social Worker Phone:  [phone]

## 2017-04-28 NOTE — Progress Notes (Signed)
[facility] CSW Progress Note  Patient referred to SW by RN for help w transportation.  CSW contacted patient by phone, patient lives in [place]. Daughter coming to assist - daughter and patient do not drive.  Patient has already contacted [employer] [facility] [place] - next available volunteer driver is 6/01 - patient placed her name on list for this option.  CSW discussed [facility] and [facility] w patient.  Will refer patient to [facility].  Will also determine if [facility] is possible option for patient given that she needs to present for treatment in [place] (outside of [place] bus route served by [employer]).  Also discussed private options including [employer] and [employer] as possible options for patient to pay for transport.  Patient will discuss options w daughter and work on scheduling transport for 1/14 appointment.    Edwyna Shell, LCSW Clinical Social Worker Phone:  [phone]

## 2017-04-30 ENCOUNTER — Ambulatory Visit (INDEPENDENT_AMBULATORY_CARE_PROVIDER_SITE_OTHER): Payer: Medicare Other | Admitting: Psychiatry

## 2017-04-30 DIAGNOSIS — F411 Generalized anxiety disorder: Secondary | ICD-10-CM | POA: Diagnosis not present

## 2017-05-01 ENCOUNTER — Ambulatory Visit: Payer: Self-pay | Admitting: Pulmonary Disease

## 2017-05-04 ENCOUNTER — Other Ambulatory Visit: Payer: Self-pay | Admitting: Family

## 2017-05-04 ENCOUNTER — Inpatient Hospital Stay: Payer: Medicare Other

## 2017-05-04 VITALS — BP 109/70 | HR 89 | Temp 97.4°F | Resp 19

## 2017-05-04 DIAGNOSIS — C787 Secondary malignant neoplasm of liver and intrahepatic bile duct: Secondary | ICD-10-CM

## 2017-05-04 DIAGNOSIS — C7951 Secondary malignant neoplasm of bone: Secondary | ICD-10-CM

## 2017-05-04 DIAGNOSIS — C3490 Malignant neoplasm of unspecified part of unspecified bronchus or lung: Secondary | ICD-10-CM

## 2017-05-04 DIAGNOSIS — Z5111 Encounter for antineoplastic chemotherapy: Secondary | ICD-10-CM | POA: Diagnosis not present

## 2017-05-04 DIAGNOSIS — C7B8 Other secondary neuroendocrine tumors: Secondary | ICD-10-CM | POA: Diagnosis not present

## 2017-05-04 DIAGNOSIS — Z5112 Encounter for antineoplastic immunotherapy: Secondary | ICD-10-CM | POA: Diagnosis not present

## 2017-05-04 DIAGNOSIS — D510 Vitamin B12 deficiency anemia due to intrinsic factor deficiency: Secondary | ICD-10-CM

## 2017-05-04 DIAGNOSIS — C3492 Malignant neoplasm of unspecified part of left bronchus or lung: Secondary | ICD-10-CM

## 2017-05-04 DIAGNOSIS — C7A8 Other malignant neuroendocrine tumors: Secondary | ICD-10-CM | POA: Diagnosis not present

## 2017-05-04 LAB — CBC WITH DIFFERENTIAL (CANCER CENTER ONLY)
BASOS PCT: 0 %
Basophils Absolute: 0 10*3/uL (ref 0.0–0.1)
Eosinophils Absolute: 0 10*3/uL (ref 0.0–0.5)
Eosinophils Relative: 0 %
HEMATOCRIT: 37.5 % (ref 34.8–46.6)
HEMOGLOBIN: 12.4 g/dL (ref 11.6–15.9)
LYMPHS ABS: 0.8 10*3/uL — AB (ref 0.9–3.3)
Lymphocytes Relative: 8 %
MCH: 32 pg (ref 26.0–34.0)
MCHC: 33.1 g/dL (ref 32.0–36.0)
MCV: 96.9 fL (ref 81.0–101.0)
MONO ABS: 0.8 10*3/uL (ref 0.1–0.9)
MONOS PCT: 8 %
NEUTROS ABS: 8.3 10*3/uL — AB (ref 1.5–6.5)
Neutrophils Relative %: 84 %
Platelet Count: 197 10*3/uL (ref 145–400)
RBC: 3.87 MIL/uL (ref 3.70–5.32)
RDW: 13.6 % (ref 11.1–15.7)
WBC Count: 9.9 10*3/uL (ref 3.9–10.3)

## 2017-05-04 LAB — CMP (CANCER CENTER ONLY)
ALBUMIN: 3.8 g/dL (ref 3.5–5.0)
ALT: 23 U/L (ref 0–55)
ANION GAP: 16 — AB (ref 5–15)
AST: 24 U/L (ref 5–34)
Alkaline Phosphatase: 86 U/L — ABNORMAL HIGH (ref 26–84)
BUN: 16 mg/dL (ref 7–22)
CALCIUM: 10.1 mg/dL (ref 8.0–10.3)
CHLORIDE: 102 mmol/L (ref 98–108)
CO2: 26 mmol/L (ref 18–33)
Creatinine: 0.7 mg/dL (ref 0.60–1.10)
GLUCOSE: 103 mg/dL (ref 73–118)
Potassium: 3.8 mmol/L (ref 3.5–5.1)
Sodium: 144 mmol/L (ref 128–145)
Total Bilirubin: 0.7 mg/dL (ref 0.2–1.2)
Total Protein: 6.8 g/dL (ref 6.4–8.1)

## 2017-05-04 MED ORDER — SODIUM CHLORIDE 0.9 % IV SOLN
Freq: Once | INTRAVENOUS | Status: AC
  Administered 2017-05-04: 13:00:00 via INTRAVENOUS

## 2017-05-04 MED ORDER — CYANOCOBALAMIN 1000 MCG/ML IJ SOLN: 1000.0000 ug | Freq: Once | INTRAMUSCULAR | Status: AC

## 2017-05-04 MED ORDER — SODIUM CHLORIDE 0.9 % IV SOLN
Freq: Once | INTRAVENOUS | Status: AC
  Administered 2017-05-04: 13:00:00 via INTRAVENOUS
  Filled 2017-05-04: qty 5

## 2017-05-04 MED ORDER — PALONOSETRON HCL INJECTION 0.25 MG/5ML
0.2500 mg | Freq: Once | INTRAVENOUS | Status: AC
  Administered 2017-05-04: 0.25 mg via INTRAVENOUS

## 2017-05-04 MED ORDER — SODIUM CHLORIDE 0.9 % IV SOLN
375.0000 mg/m2 | Freq: Once | INTRAVENOUS | Status: AC
  Administered 2017-05-04: 700 mg via INTRAVENOUS
  Filled 2017-05-04: qty 20

## 2017-05-04 MED ORDER — SODIUM CHLORIDE 0.9 % IV SOLN
240.0000 mg | Freq: Once | INTRAVENOUS | Status: AC
  Administered 2017-05-04: 240 mg via INTRAVENOUS
  Filled 2017-05-04: qty 24

## 2017-05-04 MED ORDER — PALONOSETRON HCL INJECTION 0.25 MG/5ML
INTRAVENOUS | Status: AC
  Filled 2017-05-04: qty 5

## 2017-05-04 MED ORDER — CYANOCOBALAMIN 1000 MCG/ML IJ SOLN
INTRAMUSCULAR | Status: AC
  Filled 2017-05-04: qty 1

## 2017-05-04 MED ORDER — CYANOCOBALAMIN 1000 MCG/ML IJ SOLN
1000.0000 ug | Freq: Once | INTRAMUSCULAR | Status: AC
  Administered 2017-05-04: 1000 ug via INTRAMUSCULAR

## 2017-05-04 MED ORDER — SODIUM CHLORIDE 0.9 % IV SOLN
200.0000 mg | Freq: Once | INTRAVENOUS | Status: AC
  Administered 2017-05-04: 200 mg via INTRAVENOUS
  Filled 2017-05-04: qty 8

## 2017-05-04 NOTE — Patient Instructions (Signed)
[facility] Discharge Instructions for Patients Receiving Chemotherapy  Today you received the following chemotherapy agents Keytruda, Alimta and Carboplatin.  To help prevent nausea and vomiting after your treatment, we encourage you to take your nausea medication as directed BUT NO ZOFRAN FOR 3 DAYS.  TAKE A COMPAZINE TONIGHT BEFORE BED AND THEN AS NEEDED FOR THE NEXT 2 DAYS.MAY RESUME ZOFRAN ON Thursday. If you develop nausea and vomiting that is not controlled by your nausea medication, call the clinic.   BELOW ARE SYMPTOMS THAT SHOULD BE REPORTED IMMEDIATELY:  *FEVER GREATER THAN 100.5 F  *CHILLS WITH OR WITHOUT FEVER  NAUSEA AND VOMITING THAT IS NOT CONTROLLED WITH YOUR NAUSEA MEDICATION  *UNUSUAL SHORTNESS OF BREATH  *UNUSUAL BRUISING OR BLEEDING  TENDERNESS IN MOUTH AND THROAT WITH OR WITHOUT PRESENCE OF ULCERS  *URINARY PROBLEMS  *BOWEL PROBLEMS  UNUSUAL RASH Items with * indicate a potential emergency and should be followed up as soon as possible.  Feel free to call the clinic you have any questions or concerns. The clinic phone number is [phone].  Please show the [facility] at check-in to the Emergency Department and triage nurse.

## 2017-05-05 ENCOUNTER — Telehealth: Payer: Self-pay | Admitting: *Deleted

## 2017-05-05 ENCOUNTER — Ambulatory Visit: Payer: Self-pay | Admitting: Pulmonary Disease

## 2017-05-05 NOTE — Telephone Encounter (Signed)
Patient is doing well after chemo. She has no specific symptoms or problems related to her infusions. She has all her scheduled and prn medications at home in case she needs them.  She had one episode of loose stool last pm. She has a bruise to her wrist where the IV site was. She states her stomach has had some growling. Explained to the patient that these were symptoms that didn't need intervention at this time.  Reviewed symptoms that we would want patient to call for. She understood and will follow up with the office as needed.

## 2017-05-14 ENCOUNTER — Ambulatory Visit: Payer: Medicare Other | Admitting: Psychiatry

## 2017-05-18 ENCOUNTER — Telehealth: Payer: Self-pay | Admitting: Medical

## 2017-05-18 NOTE — Telephone Encounter (Signed)
Okay to fill? 

## 2017-05-18 NOTE — Telephone Encounter (Signed)
Can refill cardizem and cymbalta. But we need to send it to [facility]. I have not heard of that pharmacy and Aurelio Brash states she Can't find that pharmacy? Give me update that rx sent successfully to the pharmacy.

## 2017-05-18 NOTE — Telephone Encounter (Signed)
Copied from [place] [phone]. Topic: Quick Communication - See Telephone Encounter >> May 18, 2017 10:34 AM Aurelio Brash B wrote: CRM for notification. See Telephone encounter for:    PT needs refills on  diltiazem (CARDIZEM) 30 MG tablet And   DULoxetine (CYMBALTA) 30 MG capsule   Pt wants to change pharmacy to [facility],  I searched for the pharmacy to add to her pharmacy list but could not find it.   Rx phone number  Is  [phone]  05/18/17.

## 2017-05-19 MED ORDER — DULOXETINE HCL 30 MG PO CPEP: 30.0000 mg | ORAL_CAPSULE | Freq: Every day | ORAL | 0 refills | Status: DC

## 2017-05-19 MED ORDER — DILTIAZEM HCL 30 MG PO TABS: 30.0000 mg | ORAL_TABLET | Freq: Four times a day (QID) | ORAL | 0 refills | Status: DC

## 2017-05-19 NOTE — Telephone Encounter (Signed)
Pt rx sent to [facility]

## 2017-05-22 ENCOUNTER — Other Ambulatory Visit: Payer: Self-pay | Admitting: *Deleted

## 2017-05-22 DIAGNOSIS — C3490 Malignant neoplasm of unspecified part of unspecified bronchus or lung: Secondary | ICD-10-CM

## 2017-05-22 DIAGNOSIS — C7951 Secondary malignant neoplasm of bone: Principal | ICD-10-CM

## 2017-05-25 ENCOUNTER — Inpatient Hospital Stay: Payer: Medicare Other | Attending: Hematology & Oncology | Admitting: Hematology & Oncology

## 2017-05-25 ENCOUNTER — Encounter: Payer: Self-pay | Admitting: Hematology & Oncology

## 2017-05-25 ENCOUNTER — Inpatient Hospital Stay: Payer: Medicare Other

## 2017-05-25 ENCOUNTER — Other Ambulatory Visit: Payer: Self-pay

## 2017-05-25 VITALS — BP 98/64 | HR 94 | Temp 97.5°F | Resp 18 | Wt 146.0 lb

## 2017-05-25 DIAGNOSIS — C3490 Malignant neoplasm of unspecified part of unspecified bronchus or lung: Secondary | ICD-10-CM

## 2017-05-25 DIAGNOSIS — Z5112 Encounter for antineoplastic immunotherapy: Secondary | ICD-10-CM | POA: Insufficient documentation

## 2017-05-25 DIAGNOSIS — Z5111 Encounter for antineoplastic chemotherapy: Secondary | ICD-10-CM | POA: Insufficient documentation

## 2017-05-25 DIAGNOSIS — Z7901 Long term (current) use of anticoagulants: Secondary | ICD-10-CM | POA: Insufficient documentation

## 2017-05-25 DIAGNOSIS — C7951 Secondary malignant neoplasm of bone: Principal | ICD-10-CM

## 2017-05-25 DIAGNOSIS — Z880 Allergy status to penicillin: Secondary | ICD-10-CM | POA: Insufficient documentation

## 2017-05-25 DIAGNOSIS — R0602 Shortness of breath: Secondary | ICD-10-CM | POA: Insufficient documentation

## 2017-05-25 DIAGNOSIS — F418 Other specified anxiety disorders: Secondary | ICD-10-CM | POA: Diagnosis not present

## 2017-05-25 DIAGNOSIS — Z79899 Other long term (current) drug therapy: Secondary | ICD-10-CM | POA: Insufficient documentation

## 2017-05-25 DIAGNOSIS — C7B8 Other secondary neuroendocrine tumors: Secondary | ICD-10-CM | POA: Diagnosis not present

## 2017-05-25 DIAGNOSIS — Z888 Allergy status to other drugs, medicaments and biological substances status: Secondary | ICD-10-CM | POA: Diagnosis not present

## 2017-05-25 DIAGNOSIS — Z881 Allergy status to other antibiotic agents status: Secondary | ICD-10-CM

## 2017-05-25 DIAGNOSIS — C7A8 Other malignant neuroendocrine tumors: Secondary | ICD-10-CM | POA: Diagnosis not present

## 2017-05-25 DIAGNOSIS — C3492 Malignant neoplasm of unspecified part of left bronchus or lung: Secondary | ICD-10-CM

## 2017-05-25 DIAGNOSIS — C787 Secondary malignant neoplasm of liver and intrahepatic bile duct: Secondary | ICD-10-CM

## 2017-05-25 LAB — CMP (CANCER CENTER ONLY)
ALK PHOS: 89 U/L — AB (ref 26–84)
ALT: 18 U/L (ref 0–55)
AST: 24 U/L (ref 5–34)
Albumin: 3.4 g/dL — ABNORMAL LOW (ref 3.5–5.0)
Anion gap: 11 (ref 5–15)
BILIRUBIN TOTAL: 0.6 mg/dL (ref 0.2–1.2)
BUN: 16 mg/dL (ref 7–22)
CHLORIDE: 105 mmol/L (ref 98–108)
CO2: 27 mmol/L (ref 18–33)
Calcium: 9.7 mg/dL (ref 8.0–10.3)
Creatinine: 0.9 mg/dL (ref 0.60–1.10)
Glucose, Bld: 89 mg/dL (ref 73–118)
POTASSIUM: 3.7 mmol/L (ref 3.3–4.7)
SODIUM: 143 mmol/L (ref 128–145)
TOTAL PROTEIN: 7.2 g/dL (ref 6.4–8.1)

## 2017-05-25 LAB — CBC WITH DIFFERENTIAL (CANCER CENTER ONLY)
BASOS ABS: 0 10*3/uL (ref 0.0–0.1)
Basophils Relative: 0 %
EOS ABS: 0 10*3/uL (ref 0.0–0.5)
EOS PCT: 0 %
HCT: 37.9 % (ref 34.8–46.6)
Hemoglobin: 12.2 g/dL (ref 11.6–15.9)
LYMPHS ABS: 0.7 10*3/uL — AB (ref 0.9–3.3)
LYMPHS PCT: 13 %
MCH: 31.2 pg (ref 26.0–34.0)
MCHC: 32.2 g/dL (ref 32.0–36.0)
MCV: 96.9 fL (ref 81.0–101.0)
Monocytes Absolute: 0.6 10*3/uL (ref 0.1–0.9)
Monocytes Relative: 11 %
Neutro Abs: 4.2 10*3/uL (ref 1.5–6.5)
Neutrophils Relative %: 76 %
PLATELETS: 304 10*3/uL (ref 145–400)
RBC: 3.91 MIL/uL (ref 3.70–5.32)
RDW: 13.7 % (ref 11.1–15.7)
WBC: 5.5 10*3/uL (ref 3.9–10.0)

## 2017-05-25 MED ORDER — PALONOSETRON HCL INJECTION 0.25 MG/5ML
0.2500 mg | Freq: Once | INTRAVENOUS | Status: AC
  Administered 2017-05-25: 0.25 mg via INTRAVENOUS

## 2017-05-25 MED ORDER — SODIUM CHLORIDE 0.9 % IV SOLN
300.0000 mg | Freq: Once | INTRAVENOUS | Status: AC
  Administered 2017-05-25: 300 mg via INTRAVENOUS
  Filled 2017-05-25: qty 30

## 2017-05-25 MED ORDER — SODIUM CHLORIDE 0.9 % IV SOLN
Freq: Once | INTRAVENOUS | Status: AC
  Administered 2017-05-25: 13:00:00 via INTRAVENOUS
  Filled 2017-05-25: qty 5

## 2017-05-25 MED ORDER — SODIUM CHLORIDE 0.9 % IV SOLN
Freq: Once | INTRAVENOUS | Status: AC
  Administered 2017-05-25: 13:00:00 via INTRAVENOUS

## 2017-05-25 MED ORDER — PALONOSETRON HCL INJECTION 0.25 MG/5ML
INTRAVENOUS | Status: AC
  Filled 2017-05-25: qty 5

## 2017-05-25 MED ORDER — SODIUM CHLORIDE 0.9 % IV SOLN
200.0000 mg | Freq: Once | INTRAVENOUS | Status: AC
  Administered 2017-05-25: 200 mg via INTRAVENOUS
  Filled 2017-05-25: qty 8

## 2017-05-25 MED ORDER — SODIUM CHLORIDE 0.9 % IV SOLN
375.0000 mg/m2 | Freq: Once | INTRAVENOUS | Status: AC
  Administered 2017-05-25: 700 mg via INTRAVENOUS
  Filled 2017-05-25: qty 8

## 2017-05-25 NOTE — Patient Instructions (Signed)
[facility] Discharge Instructions for Patients Receiving Chemotherapy    To help prevent nausea and vomiting after your treatment, we encourage you to take your nausea medication Zofran and Compazine as directed.   If you develop nausea and vomiting that is not controlled by your nausea medication, call the clinic.   BELOW ARE SYMPTOMS THAT SHOULD BE REPORTED IMMEDIATELY:  *FEVER GREATER THAN 100.5 F  *CHILLS WITH OR WITHOUT FEVER  NAUSEA AND VOMITING THAT IS NOT CONTROLLED WITH YOUR NAUSEA MEDICATION  *UNUSUAL SHORTNESS OF BREATH  *UNUSUAL BRUISING OR BLEEDING  TENDERNESS IN MOUTH AND THROAT WITH OR WITHOUT PRESENCE OF ULCERS  *URINARY PROBLEMS  *BOWEL PROBLEMS  UNUSUAL RASH Items with * indicate a potential emergency and should be followed up as soon as possible.  Feel free to call the clinic should you have any questions or concerns. The clinic phone number is [phone].  Please show the [facility] at check-in to the Emergency Department and triage nurse.  Carboplatin injection What is this medicine? CARBOPLATIN (KAR boe pla tin) is a chemotherapy drug. It targets fast dividing cells, like cancer cells, and causes these cells to die. This medicine is used to treat ovarian cancer and many other cancers. This medicine may be used for other purposes; ask your health care provider or pharmacist if you have questions. COMMON BRAND NAME(S): Paraplatin What should I tell my health care provider before I take this medicine? They need to know if you have any of these conditions: -blood disorders -hearing problems -kidney disease -recent or ongoing radiation therapy -an unusual or allergic reaction to carboplatin, cisplatin, other chemotherapy, other medicines, foods, dyes, or preservatives -pregnant or trying to get pregnant -breast-feeding How should I use this medicine? This drug is usually given as an infusion into a vein. It is administered in a  hospital or clinic by a specially trained health care professional. Talk to your pediatrician regarding the use of this medicine in children. Special care may be needed. Overdosage: If you think you have taken too much of this medicine contact a poison control center or emergency room at once. NOTE: This medicine is only for you. Do not share this medicine with others. What if I miss a dose? It is important not to miss a dose. Call your doctor or health care professional if you are unable to keep an appointment. What may interact with this medicine? -medicines for seizures -medicines to increase blood counts like filgrastim, pegfilgrastim, sargramostim -some antibiotics like amikacin, gentamicin, neomycin, streptomycin, tobramycin -vaccines Talk to your doctor or health care professional before taking any of these medicines: -acetaminophen -aspirin -ibuprofen -ketoprofen -naproxen This list may not describe all possible interactions. Give your health care provider a list of all the medicines, herbs, non-prescription drugs, or dietary supplements you use. Also tell them if you smoke, drink alcohol, or use illegal drugs. Some items may interact with your medicine. What should I watch for while using this medicine? Your condition will be monitored carefully while you are receiving this medicine. You will need important blood work done while you are taking this medicine. This drug may make you feel generally unwell. This is not uncommon, as chemotherapy can affect healthy cells as well as cancer cells. Report any side effects. Continue your course of treatment even though you feel ill unless your doctor tells you to stop. In some cases, you may be given additional medicines to help with side effects. Follow all directions for their use.  Call your doctor or health care professional for advice if you get a fever, chills or sore throat, or other symptoms of a cold or flu. Do not treat yourself. This  drug decreases your body's ability to fight infections. Try to avoid being around people who are sick. This medicine may increase your risk to bruise or bleed. Call your doctor or health care professional if you notice any unusual bleeding. Be careful brushing and flossing your teeth or using a toothpick because you may get an infection or bleed more easily. If you have any dental work done, tell your dentist you are receiving this medicine. Avoid taking products that contain aspirin, acetaminophen, ibuprofen, naproxen, or ketoprofen unless instructed by your doctor. These medicines may hide a fever. Do not become pregnant while taking this medicine. Women should inform their doctor if they wish to become pregnant or think they might be pregnant. There is a potential for serious side effects to an unborn child. Talk to your health care professional or pharmacist for more information. Do not breast-feed an infant while taking this medicine. What side effects may I notice from receiving this medicine? Side effects that you should report to your doctor or health care professional as soon as possible: -allergic reactions like skin rash, itching or hives, swelling of the face, lips, or tongue -signs of infection - fever or chills, cough, sore throat, pain or difficulty passing urine -signs of decreased platelets or bleeding - bruising, pinpoint red spots on the skin, black, tarry stools, nosebleeds -signs of decreased red blood cells - unusually weak or tired, fainting spells, lightheadedness -breathing problems -changes in hearing -changes in vision -chest pain -high blood pressure -low blood counts - This drug may decrease the number of white blood cells, red blood cells and platelets. You may be at increased risk for infections and bleeding. -nausea and vomiting -pain, swelling, redness or irritation at the injection site -pain, tingling, numbness in the hands or feet -problems with balance,  talking, walking -trouble passing urine or change in the amount of urine Side effects that usually do not require medical attention (report to your doctor or health care professional if they continue or are bothersome): -hair loss -loss of appetite -metallic taste in the mouth or changes in taste This list may not describe all possible side effects. Call your doctor for medical advice about side effects. You may report side effects to FDA at 1-800-FDA-1088. Where should I keep my medicine? This drug is given in a hospital or clinic and will not be stored at home. NOTE: This sheet is a summary. It may not cover all possible information. If you have questions about this medicine, talk to your doctor, pharmacist, or health care provider.  2018 Elsevier/Gold Standard (2007-07-13 14:38:05) Pemetrexed injection What is this medicine? PEMETREXED (PEM e TREX ed) is a chemotherapy drug used to treat lung cancers like non-small cell lung cancer and mesothelioma. It may also be used to treat other cancers. This medicine may be used for other purposes; ask your health care provider or pharmacist if you have questions. COMMON BRAND NAME(S): Alimta What should I tell my health care provider before I take this medicine? They need to know if you have any of these conditions: -infection (especially a virus infection such as chickenpox, cold sores, or herpes) -kidney disease -low blood counts, like low white cell, platelet, or red cell counts -lung or breathing disease, like asthma -radiation therapy -an unusual or allergic reaction to pemetrexed, other  medicines, foods, dyes, or preservative -pregnant or trying to get pregnant -breast-feeding How should I use this medicine? This drug is given as an infusion into a vein. It is administered in a hospital or clinic by a specially trained health care professional. Talk to your pediatrician regarding the use of this medicine in children. Special care may be  needed. Overdosage: If you think you have taken too much of this medicine contact a poison control center or emergency room at once. NOTE: This medicine is only for you. Do not share this medicine with others. What if I miss a dose? It is important not to miss your dose. Call your doctor or health care professional if you are unable to keep an appointment. What may interact with this medicine? This medicine may interact with the following medications: -Ibuprofen This list may not describe all possible interactions. Give your health care provider a list of all the medicines, herbs, non-prescription drugs, or dietary supplements you use. Also tell them if you smoke, drink alcohol, or use illegal drugs. Some items may interact with your medicine. What should I watch for while using this medicine? Visit your doctor for checks on your progress. This drug may make you feel generally unwell. This is not uncommon, as chemotherapy can affect healthy cells as well as cancer cells. Report any side effects. Continue your course of treatment even though you feel ill unless your doctor tells you to stop. In some cases, you may be given additional medicines to help with side effects. Follow all directions for their use. Call your doctor or health care professional for advice if you get a fever, chills or sore throat, or other symptoms of a cold or flu. Do not treat yourself. This drug decreases your body's ability to fight infections. Try to avoid being around people who are sick. This medicine may increase your risk to bruise or bleed. Call your doctor or health care professional if you notice any unusual bleeding. Be careful brushing and flossing your teeth or using a toothpick because you may get an infection or bleed more easily. If you have any dental work done, tell your dentist you are receiving this medicine. Avoid taking products that contain aspirin, acetaminophen, ibuprofen, naproxen, or ketoprofen unless  instructed by your doctor. These medicines may hide a fever. Call your doctor or health care professional if you get diarrhea or mouth sores. Do not treat yourself. To protect your kidneys, drink water or other fluids as directed while you are taking this medicine. Do not become pregnant while taking this medicine or for 6 months after stopping it. Women should inform their doctor if they wish to become pregnant or think they might be pregnant. Men should not father a child while taking this medicine and for 3 months after stopping it. This may interfere with the ability to father a child. You should talk to your doctor or health care professional if you are concerned about your fertility. There is a potential for serious side effects to an unborn child. Talk to your health care professional or pharmacist for more information. Do not breast-feed an infant while taking this medicine or for 1 week after stopping it. What side effects may I notice from receiving this medicine? Side effects that you should report to your doctor or health care professional as soon as possible: -allergic reactions like skin rash, itching or hives, swelling of the face, lips, or tongue -breathing problems -redness, blistering, peeling or loosening of the skin,  including inside the mouth -signs and symptoms of bleeding such as bloody or black, tarry stools; red or dark-brown urine; spitting up blood or brown material that looks like coffee grounds; red spots on the skin; unusual bruising or bleeding from the eye, gums, or nose -signs and symptoms of infection like fever or chills; cough; sore throat; pain or trouble passing urine -signs and symptoms of kidney injury like trouble passing urine or change in the amount of urine -signs and symptoms of liver injury like dark yellow or brown urine; general ill feeling or flu-like symptoms; light-colored stools; loss of appetite; nausea; right upper belly pain; unusually weak or tired;  yellowing of the eyes or skin Side effects that usually do not require medical attention (report to your doctor or health care professional if they continue or are bothersome): -constipation -dizziness -mouth sores -nausea, vomiting -pain, tingling, numbness in the hands or feet -unusually weak or tired This list may not describe all possible side effects. Call your doctor for medical advice about side effects. You may report side effects to FDA at 1-800-FDA-1088. Where should I keep my medicine? This drug is given in a hospital or clinic and will not be stored at home. NOTE: This sheet is a summary. It may not cover all possible information. If you have questions about this medicine, talk to your doctor, pharmacist, or health care provider.  2018 Elsevier/Gold Standard (2016-02-05 18:51:46) Pembrolizumab injection What is this medicine? PEMBROLIZUMAB (pem broe liz ue mab) is a monoclonal antibody. It is used to treat melanoma, head and neck cancer, Hodgkin lymphoma, non-small cell lung cancer, urothelial cancer, stomach cancer, and cancers that have a certain genetic condition. This medicine may be used for other purposes; ask your health care provider or pharmacist if you have questions. COMMON BRAND NAME(S): Keytruda What should I tell my health care provider before I take this medicine? They need to know if you have any of these conditions: -diabetes -immune system problems -inflammatory bowel disease -liver disease -lung or breathing disease -lupus -organ transplant -an unusual or allergic reaction to pembrolizumab, other medicines, foods, dyes, or preservatives -pregnant or trying to get pregnant -breast-feeding How should I use this medicine? This medicine is for infusion into a vein. It is given by a health care professional in a hospital or clinic setting. A special MedGuide will be given to you before each treatment. Be sure to read this information carefully each  time. Talk to your pediatrician regarding the use of this medicine in children. While this drug may be prescribed for selected conditions, precautions do apply. Overdosage: If you think you have taken too much of this medicine contact a poison control center or emergency room at once. NOTE: This medicine is only for you. Do not share this medicine with others. What if I miss a dose? It is important not to miss your dose. Call your doctor or health care professional if you are unable to keep an appointment. What may interact with this medicine? Interactions have not been studied. Give your health care provider a list of all the medicines, herbs, non-prescription drugs, or dietary supplements you use. Also tell them if you smoke, drink alcohol, or use illegal drugs. Some items may interact with your medicine. This list may not describe all possible interactions. Give your health care provider a list of all the medicines, herbs, non-prescription drugs, or dietary supplements you use. Also tell them if you smoke, drink alcohol, or use illegal drugs. Some items may  interact with your medicine. What should I watch for while using this medicine? Your condition will be monitored carefully while you are receiving this medicine. You may need blood work done while you are taking this medicine. Do not become pregnant while taking this medicine or for 4 months after stopping it. Women should inform their doctor if they wish to become pregnant or think they might be pregnant. There is a potential for serious side effects to an unborn child. Talk to your health care professional or pharmacist for more information. Do not breast-feed an infant while taking this medicine or for 4 months after the last dose. What side effects may I notice from receiving this medicine? Side effects that you should report to your doctor or health care professional as soon as possible: -allergic reactions like skin rash, itching or  hives, swelling of the face, lips, or tongue -bloody or black, tarry -breathing problems -changes in vision -chest pain -chills -constipation -cough -dizziness or feeling faint or lightheaded -fast or irregular heartbeat -fever -flushing -hair loss -low blood counts - this medicine may decrease the number of white blood cells, red blood cells and platelets. You may be at increased risk for infections and bleeding. -muscle pain -muscle weakness -persistent headache -signs and symptoms of high blood sugar such as dizziness; dry mouth; dry skin; fruity breath; nausea; stomach pain; increased hunger or thirst; increased urination -signs and symptoms of kidney injury like trouble passing urine or change in the amount of urine -signs and symptoms of liver injury like dark urine, light-colored stools, loss of appetite, nausea, right upper belly pain, yellowing of the eyes or skin -stomach pain -sweating -weight loss Side effects that usually do not require medical attention (report to your doctor or health care professional if they continue or are bothersome): -decreased appetite -diarrhea -tiredness This list may not describe all possible side effects. Call your doctor for medical advice about side effects. You may report side effects to FDA at 1-800-FDA-1088. Where should I keep my medicine? This drug is given in a hospital or clinic and will not be stored at home. NOTE: This sheet is a summary. It may not cover all possible information. If you have questions about this medicine, talk to your doctor, pharmacist, or health care provider.  2018 Elsevier/Gold Standard (2016-01-15 12:29:36)

## 2017-05-25 NOTE — Progress Notes (Signed)
Hematology and Oncology Follow Up Visit  [name] [id number] [birth year] 84 y.o. 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  ting these days.  I am very impressed with how well she looks.  She is tolerated her first cycle of treatment really quite nicely.  She has had no problems with cough.  There is no shortness of breath.  She has had no nausea or vomiting.  She has had no fatigue.  She is as active as ever.  There is been no issues with her bowels or bladder.  Overall, I would say that her performance status is ECOG 1.    Medications:  Current Outpatient Medications:  .  acetaminophen (TYLENOL) 325 MG tablet, Take 2 tablets (650 mg total) by mouth every 6 (six) hours as needed for mild pain (or Fever >/= 101)., Disp: , Rfl:  .  albuterol (VENTOLIN HFA) 108 (90 BASE) MCG/ACT inhaler, Inhale 2 puffs into the lungs 2 (two) times daily as needed for wheezing or shortness of breath., Disp: 18 g, Rfl: 4 .  ALPRAZolam (XANAX) 0.5 MG tablet, Take 0.5-1 tablets (0.25-0.5 mg total) by mouth 2 (two) times daily as needed for anxiety., Disp: 30 tablet, Rfl: 1 .  apixaban (ELIQUIS) 5 MG TABS tablet, Take 5 mg by mouth 2 (two) times daily., Disp: , Rfl:  .  Ascorbic Acid (VITAMIN C) 1000 MG tablet, Take 1,000 mg by mouth daily., Disp: , Rfl:  .  azelastine (ASTELIN) 0.1 % nasal spray, Place 2 sprays into both nostrils at bedtime as needed for rhinitis. Use in each nostril as directed, Disp: 30 mL, Rfl: 3 .  budesonide-formoterol (SYMBICORT) 160-4.5 MCG/ACT inhaler, Inhale 2 puffs into the lungs 2 (two) times daily., Disp: 30.6 g, Rfl: 1 .  cholecalciferol (VITAMIN D) 1000 units tablet, Take 1,000 Units by mouth daily.,  Disp: , Rfl:  .  dexamethasone (DECADRON) 4 MG tablet, Take 1 tab two times a day the day before Alimta chemo. Take 2 tabs two times a day starting the day after chemo for 3 days., Disp: 30 tablet, Rfl: 1 .  diltiazem (CARDIZEM) 30 MG tablet, Take 1 tablet (30 mg total) by mouth 4 (four) times daily., Disp: 360 tablet, Rfl: 0 .  DULoxetine (CYMBALTA) 30 MG capsule, Take 1 capsule (30 mg total) by mouth daily., Disp: 90 capsule, Rfl: 0 .  folic acid (FOLVITE) 1 MG tablet, Take 1 tablet (1 mg total) by mouth daily. Take daily starting 5-7 days before Alimta chemo. Continue until 21 days after Alimta completed., Disp: 100 tablet, Rfl: 3 .  ipratropium-albuterol (DUONEB) 0.5-2.5 (3) MG/3ML SOLN, Take 3 mLs by nebulization every 6 (six) hours as needed (Dyspnea/wheezing)., Disp: , Rfl:  .  ondansetron (ZOFRAN) 8 MG tablet, Take 1 tablet (8 mg total) by mouth every 8 (eight) hours as needed for nausea or vomiting., Disp: 30 tablet, Rfl: 0 .  polyethylene glycol powder (GLYCOLAX/MIRALAX) powder, Take 17 g by mouth 2 (two) times daily. (Patient taking differently: Take 17 g by mouth 2 (two) times daily as needed (for constipation.). ), Disp: 255 g, Rfl: 0 .  prochlorperazine (COMPAZINE) 10 MG tablet, Take 1 tablet (10 mg total) by mouth every 6 (six) hours as  needed for nausea or vomiting. (Patient not taking: Reported on 04/24/2017), Disp: 90 tablet, Rfl: 0 .  Propylene Glycol (SYSTANE BALANCE OP), Apply 2 drops to eye 3 (three) times daily as needed (for dry/irritated eyes). , Disp: , Rfl:  No current facility-administered medications for this visit.   Facility-Administered Medications Ordered in Other Visits:  .  cyanocobalamin ((VITAMIN B-12)) injection 1,000 mcg, 1,000 mcg, Intramuscular, Once, [place], Holli Humbles, NP  Allergies:  Allergies  Allergen Reactions  . Adhesive [Tape] Other (See Comments)    Caused blisters  . Amoxicillin Other (See Comments)    Has patient had a PCN reaction causing  immediate rash, facial/tongue/throat swelling, SOB or lightheadedness with hypotension: Yes Has patient had a PCN reaction causing severe rash involving mucus membranes or skin necrosis: No Has patient had a PCN reaction that required hospitalization No Has patient had a PCN reaction occurring within the last 10 years: Yes If all of the above answers are "NO", then may proceed with Cephalosporin use.   Caused thrush  . Ciprofloxacin Other (See Comments)    Caused thrush  . Ultram [Tramadol] Other (See Comments)    hypotension    Past Medical History, Surgical history, Social history, and Family History were reviewed and updated.  Review of Systems: Review of Systems  Constitutional: Negative for appetite change, fatigue, fever and unexpected weight change.  HENT:   Negative for lump/mass, mouth sores, sore throat and trouble swallowing.   Respiratory: Negative for cough, hemoptysis and shortness of breath.   Cardiovascular: Negative for leg swelling and palpitations.  Gastrointestinal: Negative for abdominal distention, abdominal pain, blood in stool, constipation, diarrhea, nausea and vomiting.  Genitourinary: Negative for bladder incontinence, dysuria, frequency and hematuria.   Musculoskeletal: Negative for arthralgias, back pain, gait problem and myalgias.  Skin: Negative for itching and rash.  Neurological: Negative for dizziness, extremity weakness, gait problem, headaches, numbness, seizures and speech difficulty.  Hematological: Does not bruise/bleed easily.  Psychiatric/Behavioral: Negative for depression and sleep disturbance. The patient is not nervous/anxious.     Physical Exam:  weight is 146 lb (66.2 kg). Her oral temperature is 97.5 F (36.4 C) (abnormal). Her blood pressure is 98/64 and her pulse is 94. Her respiration is 18 and oxygen saturation is 98%.   Wt Readings from Last 3 Encounters:  05/25/17 146 lb (66.2 kg)  04/24/17 153 lb (69.4 kg)  04/10/17 150 lb  (68 kg)    Physical Exam  Constitutional: She is oriented to person, place, and time.  HENT:  Head: Normocephalic and atraumatic.  Mouth/Throat: Oropharynx is clear and moist.  Eyes: EOM are normal. Pupils are equal, round, and reactive to light.  Neck: Normal range of motion.  Cardiovascular: Normal rate, regular rhythm and normal heart sounds.  Pulmonary/Chest: Effort normal and breath sounds normal.  Abdominal: Soft. Bowel sounds are normal.  Musculoskeletal: Normal range of motion. She exhibits no edema, tenderness or deformity.  Lymphadenopathy:    She has no cervical adenopathy.  Neurological: She is alert and oriented to person, place, and time.  Skin: Skin is warm and dry. No rash noted. No erythema.  Psychiatric: She has a normal mood and affect. Her behavior is normal. Judgment and thought content normal.  Vitals reviewed.    Lab Results  Component Value Date   WBC 5.5 05/25/2017   HGB 12.7 04/10/2017   HCT 37.9 05/25/2017   MCV 96.9 05/25/2017   PLT 304 05/25/2017     Chemistry  Component Value Date/Time   NA 143 05/25/2017 1031   NA 147 (H) 04/10/2017 1141   K 3.7 05/25/2017 1031   K 3.6 04/10/2017 1141   CL 105 05/25/2017 1031   CL 107 04/10/2017 1141   CO2 27 05/25/2017 1031   CO2 28 04/10/2017 1141   BUN 16 05/25/2017 1031   BUN 14 04/10/2017 1141   CREATININE 1.0 04/10/2017 1141   GLU 68 09/05/2016      Component Value Date/Time   CALCIUM 9.7 05/25/2017 1031   CALCIUM 9.5 04/10/2017 1141   ALKPHOS 89 (H) 05/25/2017 1031   ALKPHOS 84 04/10/2017 1141   AST 24 05/25/2017 1031   ALT 18 05/25/2017 1031   ALT 23 04/10/2017 1141   BILITOT 0.6 05/25/2017 1031       Impression and Plan: [name] is a 84 year old white female.  She has metastatic bronchogenic carcinoma.  This is a non-small cell lung cancer.  We will go ahead with her second cycle of treatment.  After this cycle, I will do a PET scan on her.  I would like to think that her  PET scan will show that her disease is responding.  Clinically, I would think that her disease is responding.  I will not make any dosage adjustments.  Her blood counts look great.  I really think that she should do okay with this cycle.  We will get her back in 3 weeks.  If the PET scan looks better, I will plan for a total of 6 cycles of treatment before we go with any maintenance therapy.    Volanda Napoleon, MD 2/4/201911:46 AM

## 2017-05-28 NOTE — Progress Notes (Deleted)
[name] was seen today in the movement disorders clinic for neurologic consultation at the request of Shanon Brow Glynda Soliday.  Her PCP is Saguier, Percell Miller, PA-C.  She is accompanied by her friend who supplements the history.  Prior records made available to me were reviewed.  The patient apparently was diagnosed with Parkinson's disease by Dr. Leta Baptist in 2011, but thinks she had symptoms long before that.  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  Dr. Gladstone Lighter records indicate that the patient was never very accepting of the diagnosis.  He did start her on levodopa but she only worked up to one tablet twice a day and ultimately ended up stopping the medication in February, 2014 after she didn't think that the medication was helpful.  She states "I didn't accept the diagnosis and I don't like to take medication."  She really has not had any neurology follow-up since that time.  She did have an appointment with me in September, 2015 but she canceled that.  02/22/15 update:  The patient is following up today as she will be having a levodopa challenge test.  She is not on any levodopa at home.  Last visit, I did schedule a modified barium swallow test.  This was done earlier today, and it was normal.  She was found to be mildly B12 deficient at our last visit (221) and I asked her to start a oral B12 supplement.  Physical therapy will start on November 16.  04/25/14 update:  The patient is following up today.  I have reviewed records from other physicians made available to me.  She has a history of vascular parkinsonism, with no benefit from levodopa.  She has been attending physical therapy for gait and balance since our last visit.  She completed her last session today  and is planning to start [facility] today.  She presents today primarily to talk about memory change.  Reports that sometimes she forgets to take her symbicort twice a day.  She has no problem driving and getting from one place to another.  She has no problems cooking/remembering to turn off stove.  She is having trouble balancing her checkbook and her daughter has been doing that.  She has an appt with the bank on Monday to help her straighten that out.  Her MRI of the brain does demonstrate relatively severe cerebral small vessel disease.  Since our last visit she has been treated for depression by her primary care physician.  She had been on sertraline for years but she did not think it was helping enough and Wellbutrin was initially added.  She felt that caused side effects and only wanted to be on one medication so the sertraline and Wellbutrin were discontinued and she was changed to Prozac.  She did not like that and ultimately went back to the sertraline.  She states that she is feeling much better now that she is back on sertraline and states that she was almost near suicidal when she was on the other medications.    12/05/15 update:  I have reviewed an extensive number of records since her last visit.  The patient saw Dr. Valentina Shaggy for testing in follow-up on April 12 and 08/08/2015.  There was no evidence of dementia.  Evidence only of mild cognitive impairment and generalized anxiety disorder.  Psychotherapy was recommended along with a possible driving assessment and repeat of the testing in 1-2 years.  The patient has been going to counseling at [facility].  She is not sure it is helping but has been to one session a month.   She do go to the emergency room on 11/22/2015 after being dizzy and taking meclizine the prior day.  That evening, she went to bed and woke up and still had some dizziness and ended up falling when she got up in the middle of the night.  She fell forward and hit her head  on the door (did not actually fall to the floor).  She presented to the emergency room because of this.  She had an MRI of the brain within the emergency room on 11/24/2015.  I reviewed this.  There was moderate to severe small vessel disease.  This had not changed from her MRI of the brain 2 years prior on 11/23/2013.  She wonders if this is the cause of dizziness and blood pressure.  She has pressure over the L head since her fall.  She finished PT about a month ago.  06/02/17 update: Patient is seen today back in the neurology clinic.  I have not seen her for a year and a half.  I have reviewed medical records made available to me.  She was diagnosed with metastatic non-small cell lung cancer since our last visit.  PREVIOUS MEDICATIONS: Sinemet  ALLERGIES:   Allergies  Allergen Reactions  . Adhesive [Tape] Other (See Comments)    Caused blisters  . Amoxicillin Other (See Comments)    Has patient had a PCN reaction causing immediate rash, facial/tongue/throat swelling, SOB or lightheadedness with hypotension: Yes Has patient had a PCN reaction causing severe rash involving mucus membranes or skin necrosis: No Has patient had a PCN reaction that required hospitalization No Has patient had a PCN reaction occurring within the last 10 years: Yes If all of the above answers are "NO", then may proceed with Cephalosporin use.   Caused thrush  . Ciprofloxacin Other (See Comments)    Caused thrush  . Ultram [Tramadol] Other (See Comments)    hypotension    CURRENT MEDICATIONS:  Outpatient Encounter Medications as of 06/02/2017  Medication Sig  . acetaminophen (TYLENOL) 325 MG tablet Take 2 tablets (650 mg total) by mouth every 6 (six) hours as needed for mild pain (or Fever >/= 101).  Marland Kitchen albuterol (VENTOLIN HFA) 108 (90 BASE) MCG/ACT inhaler Inhale 2 puffs into the lungs 2 (two) times daily as needed for wheezing or shortness of breath.  . ALPRAZolam (XANAX) 0.5 MG tablet Take 0.5-1 tablets  (0.25-0.5 mg total) by mouth 2 (two) times daily as needed for anxiety.  Marland Kitchen apixaban (ELIQUIS) 5 MG TABS tablet Take 5 mg by mouth 2 (two) times daily.  . Ascorbic Acid (VITAMIN C) 1000 MG tablet Take 1,000 mg by mouth daily.  Marland Kitchen azelastine (ASTELIN) 0.1 % nasal spray Place 2 sprays into both nostrils at bedtime as needed for rhinitis. Use in each nostril as directed  . budesonide-formoterol (SYMBICORT) 160-4.5 MCG/ACT inhaler Inhale 2 puffs into the lungs 2 (two) times daily.  . cholecalciferol (VITAMIN D) 1000 units tablet Take 1,000 Units by mouth daily.  Marland Kitchen dexamethasone (DECADRON) 4 MG tablet Take 1 tab two times a day the day before Alimta chemo. Take 2 tabs two  times a day starting the day after chemo for 3 days.  Marland Kitchen diltiazem (CARDIZEM) 30 MG tablet Take 1 tablet (30 mg total) by mouth 4 (four) times daily.  . DULoxetine (CYMBALTA) 30 MG capsule Take 1 capsule (30 mg total) by mouth daily.  . folic acid (FOLVITE) 1 MG tablet Take 1 tablet (1 mg total) by mouth daily. Take daily starting 5-7 days before Alimta chemo. Continue until 21 days after Alimta completed.  Marland Kitchen ipratropium-albuterol (DUONEB) 0.5-2.5 (3) MG/3ML SOLN Take 3 mLs by nebulization every 6 (six) hours as needed (Dyspnea/wheezing).  . ondansetron (ZOFRAN) 8 MG tablet Take 1 tablet (8 mg total) by mouth every 8 (eight) hours as needed for nausea or vomiting.  . polyethylene glycol powder (GLYCOLAX/MIRALAX) powder Take 17 g by mouth 2 (two) times daily. (Patient taking differently: Take 17 g by mouth 2 (two) times daily as needed (for constipation.). )  . prochlorperazine (COMPAZINE) 10 MG tablet Take 1 tablet (10 mg total) by mouth every 6 (six) hours as needed for nausea or vomiting. (Patient not taking: Reported on 04/24/2017)  . Propylene Glycol (SYSTANE BALANCE OP) Apply 2 drops to eye 3 (three) times daily as needed (for dry/irritated eyes).    Facility-Administered Encounter Medications as of 06/02/2017  Medication  .  cyanocobalamin ((VITAMIN B-12)) injection 1,000 mcg    PAST MEDICAL HISTORY:   Past Medical History:  Diagnosis Date  . Adenocarcinoma of left lung metastatic to liver ([place]) 04/24/2017  . Anxiety   . Arthritis   . Atrial fibrillation ([employer])   . Cardiac arrhythmia due to congenital heart disease   . Chronic constipation   . COPD (chronic obstructive pulmonary disease) ([place])   . Depression   . GERD (gastroesophageal reflux disease)   . Hemorrhoid   . Hyperlipidemia   . Idiopathic hypotension   . Large cell carcinoma of left lung, stage 4 ([place]) 04/24/2017  . Lightheadedness 11/2015  . Lung cancer metastatic to bone ([place]) 04/24/2017  . Mass of lower lobe of right lung 03/25/2017  . Melanoma of skin ([place]) 12/06/2016  . Mycotic toenails 10/27/2012  . Parkinson disease ([place])   . Recurrent UTI   . 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 Varicose veins     PAST SURGICAL HISTORY:   Past Surgical History:  Procedure Laterality Date  . APPENDECTOMY  84 years old  . COLON RESECTION  2008  . melanoma removal Left    left cheek  . vitriectomy  08-2015    SOCIAL HISTORY:   Social History   Socioeconomic History  . Marital status: Widowed    Spouse name: Not on file  . Number of children: 2  . Years of education: Not on file  . Highest education level: Not on file  Social Needs  . Financial resource strain: Not on file  . Food insecurity - worry: Not on file  . Food insecurity - inability: Not on file  . Transportation needs - medical: Not on file  . Transportation needs - non-medical: Not on file  Occupational History  . Occupation: retiredFirefighter , Animator  Tobacco Use  . Smoking status: Former Smoker    Packs/day: 2.00    Years: 30.00    Pack years: 60.00    Types: Cigarettes    Last attempt to quit: 04/21/1980    Years since quitting: 37.1  . Smokeless tobacco: Never Used  . Tobacco comment: onset age 74 -57, up to > 1ppd (almost  2 ppd)  Substance and  Sexual Activity  . Alcohol use: No    Alcohol/week: 0.0 oz  . Drug use: No  . Sexual activity: No  Other Topics Concern  . Not on file  Social History Narrative   Widowed, lives alone. 1 child in [place] and 1 in [place]. 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   Still drives as of [age]    FAMILY HISTORY:   Family Status  Relation Name Status  . Brother  Deceased       asthma  . Father  Deceased       throat cancer  . Daughter [name] Alive  . Daughter Brendolyn Patty  . Sister  Deceased       breast cancer  . Mother  Deceased at age 28       arthritis   . MGF  (Not Specified)  . PGM  (Not Specified)  . Sister  (Not Specified)  . Neg Hx  (Not Specified)    ROS:  A complete 10 system review of systems was obtained and was unremarkable apart from what is mentioned above.  PHYSICAL EXAMINATION:    VITALS:   There were no vitals filed for this visit.  GEN:  The patient appears stated age and is in NAD. HEENT:  Normocephalic, atraumatic.  The mucous membranes are moist. The superficial temporal arteries are without ropiness or tenderness. CV:  RRR Lungs:  CTAB Neck/HEME:  There are no carotid bruits bilaterally.  Neurological examination:  Orientation: A&O x 3.   Montreal Cognitive Assessment  04/26/2015  Visuospatial/ Executive (0/5) 3  Naming (0/3) 3  Attention: Read list of digits (0/2) 2  Attention: Read list of letters (0/1) 1  Attention: Serial 7 subtraction starting at 100 (0/3) 1  Language: Repeat phrase (0/2) 2  Language : Fluency (0/1) 0  Abstraction (0/2) 2  Delayed Recall (0/5) 0  Orientation (0/6) 5  Total 19  Adjusted Score (based on education) 20    Cranial nerves: There is good facial symmetry.  There is no significant facial hypomimia.   The visual fields are full to confrontational testing. The speech is fluent and clear.  She has not particularly hypophonic.  She has no difficulty with guttural sounds.  Soft palate rises symmetrically and  there is no tongue deviation. Hearing is intact to conversational tone. Sensation: Sensation is intact to light and pinprick throughout (facial, trunk, extremities). Vibration is markedly decreased in a distal fashion. There is no extinction with double simultaneous stimulation. There is no sensory dermatomal level identified. Motor: Strength is 5/5 in the bilateral upper and lower extremities.   Shoulder shrug is equal and symmetric.  There is no pronator drift.   Movement examination: Tone: normal Abnormal movements: The patient does have a very minimal tremor at rest, L more than R Coordination:  There is no significant decremation with RAM's, with any form of RAMS, including alternating supination and pronation of the forearm, hand opening and closing, finger taps, heel taps and toe taps. Gait and Station: The patient has no difficulty arising out of a deep-seated chair without the use of the hands. The patient's stride length is decreased but she doesn't shuffle.  The patient has a negative pull test.         Labs:  Lab Results  Component Value Date   VITAMINB12 221 02/12/2015   Lab Results  Component Value Date   HGBA1C 5.8 06/19/2016  ASSESSMENT/PLAN:  1.  Parkinsonism  -While the patient does have some evidence of parkinsonism, it is likely not idiopathic Parkinson's disease, but rather vascular parkinsonism.  An MRI of the brain from August, 2017 was reviewed today and there was a significant amount of cerebral small vessel disease.  I explained to her that this had not changed from her MRI exactly 2 years ago in August, 2015.  UPDRS motor on/off test on 02/22/15 showed only minimal improvement with levodopa and no improvement in walking, which is where she has most of her issues. Reassured her that MRI findings unrelated to dizziness and low blood pressure.  -We discussed safety in detail.  We discussed the importance of using an ambulatory assistive device, particularly if  she is feeling dizzy/unstable.  Had walker today. Dizziness has resolved now.  She has meclizine for prn use but told not to drive when has it.  Has not even filled RX but told her to do so in case needs it.  -talked to her about drinking plenty of water and that could help dizziness/blood pressure.    -talked to her about the [facility] cycling programs and she states that [employer] recommended it as well and she asked me to fill out the form.  Did so today but told her to make sure okay with cardiologist.  -try to get back involved in ACT.  Info given and given on scholarship as well.  Met our Education officer, museum today.  -***tried levodopa in the past and was of no benefit, which is usually the case with vascular parkinsonism 2.  Dysphagia  -MBE done on 02/22/15 was normal 3.  Gait instability  -I think that this is multifactorial.  While this can because by parkinsonism, she has evidence of very significant peripheral neuropathy.  She was mildly deficient in B12 and is now taking a supplement. 4.  Memory change  -Had neuropsych testing done by Dr. Valentina Shaggy in April, 2017 with evidence of generalized anxiety disorder and mild cognitive impairment.  Is going to [facility] for counseling and I would encourage her to continue this. 5.  Metastatic nonsmall cell lung CA  -***just recently dx in 03/2017.  Started chemo

## 2017-05-29 ENCOUNTER — Telehealth: Payer: Self-pay | Admitting: *Deleted

## 2017-05-29 NOTE — Telephone Encounter (Signed)
Patient is c/o nausea. She has taken compazine but unsure on whether or not she can take zofran. Reviewed all instructions and direction for the prn medications she has. She understands she can take zofran and compazine, not in one combined dose, but scattered through out the day.  She also has some concern with constipation. Instructed patient to increase miralax to BID. She mentioned using an enema last weekend. Instructed patient to not use enemas or suppositories with increased risk of infection or bleeding. She understood.  Also reviewed instructions for decadron.

## 2017-06-01 ENCOUNTER — Telehealth: Payer: Self-pay | Admitting: *Deleted

## 2017-06-01 NOTE — Telephone Encounter (Signed)
Patient c/o constipation. She called the on call service this weekend with the same complaint. They sent a prescription for lactulose q8h. The patient has taken this medication every 8-12h since Saturday evening without success. She'd like to know what to do next.   Reviewed with Dr Marin Olp. Patient is to take lactulose q4h until BM. When BM start, stop lactulose and then start Miralax BID. Patient understands with teach back.  Patient will make sure she takes lactulose every 4h until she has a BM. If no BM in the next 24h she will call the office back.

## 2017-06-02 ENCOUNTER — Encounter: Payer: Self-pay | Admitting: Hematology & Oncology

## 2017-06-02 ENCOUNTER — Telehealth: Payer: Self-pay | Admitting: *Deleted

## 2017-06-02 ENCOUNTER — Ambulatory Visit: Payer: Self-pay | Admitting: Neurology

## 2017-06-02 MED ORDER — LACTULOSE 10 GM/15ML PO SOLN: 10.0000 g | ORAL | 0 refills | Status: AC | PRN

## 2017-06-02 NOTE — Telephone Encounter (Signed)
Patient notifying the office that she still hasn't had a BM. She has been taking lactulose every 4hrs while awake with nor results yet. (less than 24hrs) She'd like to know what to do next  Patient is still eating and drinking. She denies nausea. She states her abdomen is not distended and non-painful.   Reviewed with Dr Marin Olp. He wants patient to continue Lactulose q4hrs until she has a BM. No suppositories or enemas.  Reviewed instructions with patient. She understood.

## 2017-06-03 ENCOUNTER — Telehealth: Payer: Self-pay | Admitting: *Deleted

## 2017-06-03 NOTE — Telephone Encounter (Signed)
Patient states her bowels began during the night and are now loose in nature. She has continued to take her lactulose this morning. She wants to know what to do next.  Explained that after so many laxatives, diarrhea is not unexpected. She is to stop taking all laxatives and stool softeners. She is to increase her fluid intake. She is to rest. She understands all these instructions.  Also suggested that in 2-3 days when her BM return to normal, that she start miralax daily to prevent constipation again. She confirmed this teaching with teach back.

## 2017-06-05 ENCOUNTER — Other Ambulatory Visit: Payer: Self-pay | Admitting: Internal Medicine

## 2017-06-08 ENCOUNTER — Telehealth: Payer: Self-pay | Admitting: Medical Oncology

## 2017-06-08 ENCOUNTER — Telehealth: Payer: Self-pay | Admitting: *Deleted

## 2017-06-08 MED ORDER — SULFAMETHOXAZOLE-TRIMETHOPRIM 800-160 MG PO TABS: 1.0000 | ORAL_TABLET | Freq: Two times a day (BID) | ORAL | Status: DC

## 2017-06-08 NOTE — Telephone Encounter (Signed)
Patient has c/o urinary tract infection. She has frequency, no pain with urination, but does have flank pain.  Symptoms reviewed with Dr Marin Olp. He would like Bactrim sent to the patient's pharmacy.  Patient is aware of new prescription and pharmacy confirmed.

## 2017-06-08 NOTE — Telephone Encounter (Signed)
Spoke to [employer] about pt symptoms.

## 2017-06-11 ENCOUNTER — Telehealth: Payer: Self-pay

## 2017-06-11 ENCOUNTER — Encounter (HOSPITAL_COMMUNITY)
Admission: RE | Admit: 2017-06-11 | Discharge: 2017-06-11 | Disposition: A | Discharge status: Home / Self Care | Payer: Medicare Other | Source: Ambulatory Visit | Attending: Hematology & Oncology | Admitting: Hematology & Oncology

## 2017-06-11 DIAGNOSIS — J189 Pneumonia, unspecified organism: Secondary | ICD-10-CM | POA: Diagnosis not present

## 2017-06-11 DIAGNOSIS — C787 Secondary malignant neoplasm of liver and intrahepatic bile duct: Secondary | ICD-10-CM | POA: Insufficient documentation

## 2017-06-11 DIAGNOSIS — C3492 Malignant neoplasm of unspecified part of left bronchus or lung: Secondary | ICD-10-CM

## 2017-06-11 DIAGNOSIS — C3490 Malignant neoplasm of unspecified part of unspecified bronchus or lung: Secondary | ICD-10-CM | POA: Diagnosis not present

## 2017-06-11 LAB — GLUCOSE, CAPILLARY: Glucose-Capillary: 81 mg/dL (ref 65–99)

## 2017-06-11 MED ORDER — FLUDEOXYGLUCOSE F - 18 (FDG) INJECTION
7.2000 | Freq: Once | INTRAVENOUS | Status: AC | PRN
  Administered 2017-06-11: 7.2 via INTRAVENOUS

## 2017-06-11 NOTE — Telephone Encounter (Addendum)
Received VM from pt stating that she is having "a terrible reaction" to Bactrim that was prescribed earlier in the week. Also requests PET results.   Per Dr Marin Olp, d/c Bactrim. Pt verbalizes understanding. Below message related to PET given to pt via phone. Pt wrote down this information and expresses joyful appreciation. Questions if she needs to keep future appts. Pt aware to keep appts as scheduled for Monday. Questions if she will continue treatment. Educated pt that future treatments will continue as originally planned. Reviewed home decadron administration as premedication.    ----- Message from Volanda Napoleon, MD sent at 06/11/2017  2:32 PM EST ----- Call - cancer has shrunk by 65%!!!!  This is fantastic!!!  Laurey Arrow

## 2017-06-15 ENCOUNTER — Encounter: Payer: Self-pay | Admitting: Hematology & Oncology

## 2017-06-15 ENCOUNTER — Inpatient Hospital Stay: Payer: Medicare Other

## 2017-06-15 ENCOUNTER — Other Ambulatory Visit: Payer: Self-pay | Admitting: *Deleted

## 2017-06-15 ENCOUNTER — Inpatient Hospital Stay (HOSPITAL_BASED_OUTPATIENT_CLINIC_OR_DEPARTMENT_OTHER): Payer: Medicare Other | Admitting: Hematology & Oncology

## 2017-06-15 ENCOUNTER — Other Ambulatory Visit: Payer: Self-pay

## 2017-06-15 VITALS — BP 111/76 | HR 84 | Temp 97.9°F | Resp 20 | Wt 145.0 lb

## 2017-06-15 DIAGNOSIS — Z7901 Long term (current) use of anticoagulants: Secondary | ICD-10-CM | POA: Diagnosis not present

## 2017-06-15 DIAGNOSIS — Z881 Allergy status to other antibiotic agents status: Secondary | ICD-10-CM | POA: Diagnosis not present

## 2017-06-15 DIAGNOSIS — C7A8 Other malignant neuroendocrine tumors: Secondary | ICD-10-CM

## 2017-06-15 DIAGNOSIS — C3490 Malignant neoplasm of unspecified part of unspecified bronchus or lung: Secondary | ICD-10-CM

## 2017-06-15 DIAGNOSIS — C7B8 Other secondary neuroendocrine tumors: Secondary | ICD-10-CM | POA: Diagnosis not present

## 2017-06-15 DIAGNOSIS — Z888 Allergy status to other drugs, medicaments and biological substances status: Secondary | ICD-10-CM | POA: Diagnosis not present

## 2017-06-15 DIAGNOSIS — R0602 Shortness of breath: Secondary | ICD-10-CM | POA: Diagnosis not present

## 2017-06-15 DIAGNOSIS — Z79899 Other long term (current) drug therapy: Secondary | ICD-10-CM | POA: Diagnosis not present

## 2017-06-15 DIAGNOSIS — C3492 Malignant neoplasm of unspecified part of left bronchus or lung: Secondary | ICD-10-CM

## 2017-06-15 DIAGNOSIS — Z5112 Encounter for antineoplastic immunotherapy: Secondary | ICD-10-CM | POA: Diagnosis not present

## 2017-06-15 DIAGNOSIS — Z880 Allergy status to penicillin: Secondary | ICD-10-CM | POA: Diagnosis not present

## 2017-06-15 DIAGNOSIS — C7951 Secondary malignant neoplasm of bone: Secondary | ICD-10-CM

## 2017-06-15 DIAGNOSIS — C787 Secondary malignant neoplasm of liver and intrahepatic bile duct: Secondary | ICD-10-CM

## 2017-06-15 DIAGNOSIS — F418 Other specified anxiety disorders: Secondary | ICD-10-CM | POA: Diagnosis not present

## 2017-06-15 DIAGNOSIS — Z5111 Encounter for antineoplastic chemotherapy: Secondary | ICD-10-CM | POA: Diagnosis not present

## 2017-06-15 LAB — CBC WITH DIFFERENTIAL (CANCER CENTER ONLY)
Basophils Absolute: 0 10*3/uL (ref 0.0–0.1)
Basophils Relative: 0 %
EOS ABS: 0 10*3/uL (ref 0.0–0.5)
Eosinophils Relative: 0 %
HCT: 35.2 % (ref 34.8–46.6)
Hemoglobin: 11.6 g/dL (ref 11.6–15.9)
Lymphocytes Relative: 12 %
Lymphs Abs: 0.7 10*3/uL — ABNORMAL LOW (ref 0.9–3.3)
MCH: 31.8 pg (ref 26.0–34.0)
MCHC: 33 g/dL (ref 32.0–36.0)
MCV: 96.4 fL (ref 81.0–101.0)
MONO ABS: 0.6 10*3/uL (ref 0.1–0.9)
MONOS PCT: 10 %
Neutro Abs: 4.7 10*3/uL (ref 1.5–6.5)
Neutrophils Relative %: 78 %
Platelet Count: 287 10*3/uL (ref 145–400)
RBC: 3.65 MIL/uL — ABNORMAL LOW (ref 3.70–5.32)
RDW: 14.9 % (ref 11.1–15.7)
WBC Count: 6 10*3/uL (ref 3.9–10.0)

## 2017-06-15 LAB — CMP (CANCER CENTER ONLY)
ALBUMIN: 3.1 g/dL — AB (ref 3.5–5.0)
ALT: 18 U/L (ref 10–47)
AST: 22 U/L (ref 11–38)
Alkaline Phosphatase: 91 U/L — ABNORMAL HIGH (ref 26–84)
Anion gap: 11 (ref 5–15)
BILIRUBIN TOTAL: 0.5 mg/dL (ref 0.2–1.6)
BUN: 16 mg/dL (ref 7–22)
CHLORIDE: 106 mmol/L (ref 98–108)
CO2: 25 mmol/L (ref 18–33)
CREATININE: 0.9 mg/dL (ref 0.60–1.20)
Calcium: 9.4 mg/dL (ref 8.0–10.3)
Glucose, Bld: 146 mg/dL — ABNORMAL HIGH (ref 73–118)
POTASSIUM: 3.1 mmol/L — AB (ref 3.3–4.7)
Sodium: 142 mmol/L (ref 128–145)
Total Protein: 6.9 g/dL (ref 6.4–8.1)

## 2017-06-15 LAB — LACTATE DEHYDROGENASE: LDH: 241 U/L (ref 125–245)

## 2017-06-15 MED ORDER — SODIUM CHLORIDE 0.9 % IV SOLN
300.0000 mg | Freq: Once | INTRAVENOUS | Status: AC
  Administered 2017-06-15: 300 mg via INTRAVENOUS
  Filled 2017-06-15: qty 30

## 2017-06-15 MED ORDER — SODIUM CHLORIDE 0.9 % IV SOLN
375.0000 mg/m2 | Freq: Once | INTRAVENOUS | Status: AC
  Administered 2017-06-15: 700 mg via INTRAVENOUS
  Filled 2017-06-15: qty 20

## 2017-06-15 MED ORDER — CYANOCOBALAMIN 1000 MCG/ML IJ SOLN
INTRAMUSCULAR | Status: AC
  Filled 2017-06-15: qty 1

## 2017-06-15 MED ORDER — CYANOCOBALAMIN 1000 MCG/ML IJ SOLN
1000.0000 ug | Freq: Once | INTRAMUSCULAR | Status: AC
  Administered 2017-06-15: 1000 ug via INTRAMUSCULAR

## 2017-06-15 MED ORDER — SODIUM CHLORIDE 0.9 % IV SOLN
200.0000 mg | Freq: Once | INTRAVENOUS | Status: AC
  Administered 2017-06-15: 200 mg via INTRAVENOUS
  Filled 2017-06-15: qty 8

## 2017-06-15 MED ORDER — SODIUM CHLORIDE 0.9 % IV SOLN
Freq: Once | INTRAVENOUS | Status: AC
  Administered 2017-06-15: 12:00:00 via INTRAVENOUS

## 2017-06-15 MED ORDER — PALONOSETRON HCL INJECTION 0.25 MG/5ML
INTRAVENOUS | Status: AC
  Filled 2017-06-15: qty 5

## 2017-06-15 MED ORDER — SODIUM CHLORIDE 0.9 % IV SOLN
Freq: Once | INTRAVENOUS | Status: AC
  Administered 2017-06-15: 12:00:00 via INTRAVENOUS
  Filled 2017-06-15: qty 5

## 2017-06-15 MED ORDER — PALONOSETRON HCL INJECTION 0.25 MG/5ML
0.2500 mg | Freq: Once | INTRAVENOUS | Status: AC
  Administered 2017-06-15: 0.25 mg via INTRAVENOUS

## 2017-06-15 MED ORDER — DEXAMETHASONE 4 MG PO TABS: ORAL_TABLET | ORAL | 1 refills | Status: DC

## 2017-06-15 NOTE — Progress Notes (Signed)
Hematology and Oncology Follow Up Visit  [name] [id number] 07/11/1927 84 y.o. 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  ting these days.  I knew that she was a tough lady.  I am still sticking to my opinion.  She had a PET scan done last week.  To no surprise, the PET scan show that her tumor is responding nicely.  It decreased from 3.5 cm down to 1.4 cm.  The SUV went from 24 down to 5.1.  Compared she also had improvement in her right hilar and subcarinal adenopathy.  The liver lesion in the right lobe is no longer seen.  No bone lesions are noted.  I am just so happy for her.  She is still doing so well.  Her quality of life is improving.  Her appetite is good.  She is trying to exercise a little bit more.  She is having some shortness of breath.  This is been chronic.  I told her that she can see her pulmonologist for this.  She has had no bleeding.  She has had no fever.  She has had no nausea or vomiting.  Overall, her performance status is ECOG 1.  Medications:  Current Outpatient Medications:  .  acetaminophen (TYLENOL) 325 MG tablet, Take 2 tablets (650 mg total) by mouth every 6 (six) hours as needed for mild pain (or Fever >/= 101)., Disp: , Rfl:  .  albuterol (VENTOLIN HFA) 108 (90 BASE) MCG/ACT inhaler, Inhale 2 puffs into the lungs 2 (two) times daily as needed for wheezing or shortness of breath., Disp: 18 g, Rfl: 4 .  ALPRAZolam (XANAX) 0.5 MG tablet, Take 0.5-1 tablets (0.25-0.5 mg total) by mouth 2 (two) times daily as needed for anxiety., Disp: 30 tablet, Rfl: 1 .  apixaban (ELIQUIS) 5 MG TABS tablet, Take 5 mg by mouth 2 (two) times daily., Disp: , Rfl:  .  Ascorbic Acid (VITAMIN C)  1000 MG tablet, Take 1,000 mg by mouth daily., Disp: , Rfl:  .  azelastine (ASTELIN) 0.1 % nasal spray, Place 2 sprays into both nostrils at bedtime as needed for rhinitis. Use in each nostril as directed, Disp: 30 mL, Rfl: 3 .  budesonide-formoterol (SYMBICORT) 160-4.5 MCG/ACT inhaler, Inhale 2 puffs into the lungs 2 (two) times daily., Disp: 30.6 g, Rfl: 1 .  cholecalciferol (VITAMIN D) 1000 units tablet, Take 1,000 Units by mouth daily., Disp: , Rfl:  .  diltiazem (CARDIZEM) 30 MG tablet, Take 1 tablet (30 mg total) by mouth 4 (four) times daily. (Patient taking differently: Take 30 mg by mouth 3 (three) times daily. ), Disp: 360 tablet, Rfl: 0 .  DULoxetine (CYMBALTA) 30 MG capsule, Take 1 capsule (30 mg total) by mouth daily., Disp: 90 capsule, Rfl: 0 .  folic acid (FOLVITE) 1 MG tablet, Take 1 tablet (1 mg total) by mouth daily. Take daily starting 5-7 days before Alimta chemo. Continue until 21 days after Alimta completed., Disp: 100 tablet, Rfl: 3 .  ipratropium-albuterol (DUONEB) 0.5-2.5 (3) MG/3ML SOLN, Take 3 mLs by nebulization every 6 (six) hours as needed (Dyspnea/wheezing)., Disp: , Rfl:  .  lactulose (CHRONULAC) 10 GM/15ML solution, Take 15 mLs (10 g total) by mouth every 4 (  four) hours as needed for mild constipation., Disp: 240 mL, Rfl: 0 .  ondansetron (ZOFRAN) 8 MG tablet, Take 1 tablet (8 mg total) by mouth every 8 (eight) hours as needed for nausea or vomiting., Disp: 30 tablet, Rfl: 0 .  polyethylene glycol powder (GLYCOLAX/MIRALAX) powder, Take 17 g by mouth 2 (two) times daily. (Patient taking differently: Take 17 g by mouth 2 (two) times daily as needed (for constipation.). ), Disp: 255 g, Rfl: 0 .  prochlorperazine (COMPAZINE) 10 MG tablet, Take 1 tablet (10 mg total) by mouth every 6 (six) hours as needed for nausea or vomiting., Disp: 90 tablet, Rfl: 0 .  Propylene Glycol (SYSTANE BALANCE OP), Apply 2 drops to eye 3 (three) times daily as needed (for dry/irritated eyes). ,  Disp: , Rfl:  .  dexamethasone (DECADRON) 4 MG tablet, Take 1 tab two times a day the day before Alimta chemo. Take 2 tabs two times a day starting the day after chemo for 3 days., Disp: 30 tablet, Rfl: 1 .  DULoxetine (CYMBALTA) 30 MG capsule, TAKE 1 CAPSULE DAILY BY MOUTH., Disp: 90 capsule, Rfl: 1 .  sulfamethoxazole-trimethoprim (BACTRIM DS,SEPTRA DS) 800-160 MG tablet, Take 1 tablet by mouth 2 (two) times daily. For 5 days, Disp: 10 tablet, Rfl: 00 No current facility-administered medications for this visit.   Facility-Administered Medications Ordered in Other Visits:  .  cyanocobalamin ((VITAMIN B-12)) injection 1,000 mcg, 1,000 mcg, Intramuscular, Once, [place], Holli Humbles, NP  Allergies:  Allergies  Allergen Reactions  . Adhesive [Tape] Other (See Comments)    Caused blisters  . Amoxicillin Other (See Comments)    Has patient had a PCN reaction causing immediate rash, facial/tongue/throat swelling, SOB or lightheadedness with hypotension: Yes Has patient had a PCN reaction causing severe rash involving mucus membranes or skin necrosis: No Has patient had a PCN reaction that required hospitalization No Has patient had a PCN reaction occurring within the last 10 years: Yes If all of the above answers are "NO", then may proceed with Cephalosporin use.   Caused thrush  . Ciprofloxacin Other (See Comments)    Caused thrush  . Ultram [Tramadol] Other (See Comments)    hypotension    Past Medical History, Surgical history, Social history, and Family History were reviewed and updated.  Review of Systems: Review of Systems  Constitutional: Negative for appetite change, fatigue, fever and unexpected weight change.  HENT:   Negative for lump/mass, mouth sores, sore throat and trouble swallowing.   Respiratory: Negative for cough, hemoptysis and shortness of breath.   Cardiovascular: Negative for leg swelling and palpitations.  Gastrointestinal: Negative for abdominal distention,  abdominal pain, blood in stool, constipation, diarrhea, nausea and vomiting.  Genitourinary: Negative for bladder incontinence, dysuria, frequency and hematuria.   Musculoskeletal: Negative for arthralgias, back pain, gait problem and myalgias.  Skin: Negative for itching and rash.  Neurological: Negative for dizziness, extremity weakness, gait problem, headaches, numbness, seizures and speech difficulty.  Hematological: Does not bruise/bleed easily.  Psychiatric/Behavioral: Negative for depression and sleep disturbance. The patient is not nervous/anxious.     Physical Exam:  weight is 145 lb (65.8 kg). Her oral temperature is 97.9 F (36.6 C). Her blood pressure is 111/76 and her pulse is 84. Her respiration is 20 and oxygen saturation is 100%.   Wt Readings from Last 3 Encounters:  06/15/17 145 lb (65.8 kg)  05/25/17 146 lb (66.2 kg)  04/24/17 153 lb (69.4 kg)    Physical Exam  Constitutional: She  is oriented to person, place, and time.  HENT:  Head: Normocephalic and atraumatic.  Mouth/Throat: Oropharynx is clear and moist.  Eyes: EOM are normal. Pupils are equal, round, and reactive to light.  Neck: Normal range of motion.  Cardiovascular: Normal rate, regular rhythm and normal heart sounds.  Pulmonary/Chest: Effort normal and breath sounds normal.  Abdominal: Soft. Bowel sounds are normal.  Musculoskeletal: Normal range of motion. She exhibits no edema, tenderness or deformity.  Lymphadenopathy:    She has no cervical adenopathy.  Neurological: She is alert and oriented to person, place, and time.  Skin: Skin is warm and dry. No rash noted. No erythema.  Psychiatric: She has a normal mood and affect. Her behavior is normal. Judgment and thought content normal.  Vitals reviewed.    Lab Results  Component Value Date   WBC 6.0 06/15/2017   HGB 12.7 04/10/2017   HCT 35.2 06/15/2017   MCV 96.4 06/15/2017   PLT 287 06/15/2017     Chemistry      Component Value  Date/Time   NA 142 06/15/2017 0933   NA 147 (H) 04/10/2017 1141   K 3.1 (L) 06/15/2017 0933   K 3.6 04/10/2017 1141   CL 106 06/15/2017 0933   CL 107 04/10/2017 1141   CO2 25 06/15/2017 0933   CO2 28 04/10/2017 1141   BUN 16 06/15/2017 0933   BUN 14 04/10/2017 1141   CREATININE 0.90 06/15/2017 0933   CREATININE 1.0 04/10/2017 1141   GLU 68 09/05/2016      Component Value Date/Time   CALCIUM 9.4 06/15/2017 0933   CALCIUM 9.5 04/10/2017 1141   ALKPHOS 91 (H) 06/15/2017 0933   ALKPHOS 84 04/10/2017 1141   AST 22 06/15/2017 0933   ALT 18 06/15/2017 0933   ALT 23 04/10/2017 1141   BILITOT 0.5 06/15/2017 0933       Impression and Plan: [name] is a 84 year old white female.  She has metastatic bronchogenic carcinoma.  This is a non-small cell lung cancer.  Given the outstanding results of the PET scan, we will continue with therapy.  We will go ahead with her third cycle of treatment.  I am still planning on a total of 6 cycles of full dose treatment and then we can go to maintenance therapy.  We will go ahead and plan to get her back in another 3 weeks.  I spent about 30 minutes with her.  Over half the time was spent face-to-face.  I reviewed her PET scan results.  I reviewed the lab work.  I made my recommendations for her at to exercise.  She understands all of this.     Volanda Napoleon, MD 2/25/201911:37 AM

## 2017-06-15 NOTE — Patient Instructions (Signed)
[facility] Discharge Instructions for Patients Receiving Chemotherapy  Today you received the following chemotherapy agents Carbo, Alimta, Keytruda  To help prevent nausea and vomiting after your treatment, we encourage you to take your nausea medication    If you develop nausea and vomiting that is not controlled by your nausea medication, call the clinic.   BELOW ARE SYMPTOMS THAT SHOULD BE REPORTED IMMEDIATELY:  *FEVER GREATER THAN 100.5 F  *CHILLS WITH OR WITHOUT FEVER  NAUSEA AND VOMITING THAT IS NOT CONTROLLED WITH YOUR NAUSEA MEDICATION  *UNUSUAL SHORTNESS OF BREATH  *UNUSUAL BRUISING OR BLEEDING  TENDERNESS IN MOUTH AND THROAT WITH OR WITHOUT PRESENCE OF ULCERS  *URINARY PROBLEMS  *BOWEL PROBLEMS  UNUSUAL RASH Items with * indicate a potential emergency and should be followed up as soon as possible.  Feel free to call the clinic should you have any questions or concerns. The clinic phone number is [phone].  Please show the House at check-in to the Emergency Department and triage nurse.

## 2017-06-15 NOTE — Addendum Note (Signed)
Addended by: Burney Gauze R on: 06/15/2017 12:02 PM   Modules accepted: Orders

## 2017-06-22 ENCOUNTER — Other Ambulatory Visit: Payer: Self-pay | Admitting: *Deleted

## 2017-06-30 ENCOUNTER — Ambulatory Visit (INDEPENDENT_AMBULATORY_CARE_PROVIDER_SITE_OTHER)
Admission: RE | Admit: 2017-06-30 | Discharge: 2017-06-30 | Disposition: A | Discharge status: Home / Self Care | Payer: Medicare Other | Source: Ambulatory Visit | Attending: Pulmonary Disease | Admitting: Pulmonary Disease

## 2017-06-30 ENCOUNTER — Ambulatory Visit (INDEPENDENT_AMBULATORY_CARE_PROVIDER_SITE_OTHER): Payer: Medicare Other | Admitting: Pulmonary Disease

## 2017-06-30 ENCOUNTER — Encounter: Payer: Self-pay | Admitting: Pulmonary Disease

## 2017-06-30 VITALS — BP 134/72 | HR 101 | Ht 70.0 in | Wt 148.0 lb

## 2017-06-30 DIAGNOSIS — R0602 Shortness of breath: Secondary | ICD-10-CM

## 2017-06-30 MED ORDER — FLUTICASONE-UMECLIDIN-VILANT 100-62.5-25 MCG/INH IN AEPB: 1.0000 | INHALATION_SPRAY | Freq: Every day | RESPIRATORY_TRACT | 0 refills | Status: AC

## 2017-06-30 MED ORDER — PREDNISONE 10 MG PO TABS: ORAL_TABLET | ORAL | 0 refills | Status: DC

## 2017-06-30 NOTE — Progress Notes (Signed)
[name]    [id number]    10/11/1927  Primary Care Physician:Saguier, Iris Pert  Referring Physician: Mackie Pai, PA-C [address] [place], [place] [address]  Chief complaint:   Follow-up for COPD GOLD B Stage IV neuroendocrine lung carcinoma.  HPI: [name] is a 84 year old with COPD GOLD B (CAT score 25, FEV1 76% on 2013), paroxysmal atrial fibrillation on Eliquis, Parkinsons.    Diagnosed with metastatic lung cancer, right lower lobe mass with liver metastasis, bone spread, pathology shows neuroendocrine tumor.  She is currently on chemotherapy under the care of Dr. Marin Olp.  Follow-up PET scan shows good response to therapy.  Pets: Cats.  Occupation: Retired Licensed conveyancer Exposures: None Smoking history: 60-pack-year smoking history. Quit in 1982. No alcohol, drug use. She is widowed and lives alone.  Interim history: She is tolerating the chemo reasonably well.  Reports worsening dyspnea over the past 5 days but wheezing.  No cough, sputum production, fevers, chills. Continues on the Symbicort and albuterol inhaler.  Outpatient Encounter Medications as of 06/30/2017  Medication Sig  . acetaminophen (TYLENOL) 325 MG tablet Take 2 tablets (650 mg total) by mouth every 6 (six) hours as needed for mild pain (or Fever >/= 101).  Marland Kitchen albuterol (VENTOLIN HFA) 108 (90 BASE) MCG/ACT inhaler Inhale 2 puffs into the lungs 2 (two) times daily as needed for wheezing or shortness of breath.  . ALPRAZolam (XANAX) 0.5 MG tablet Take 0.5-1 tablets (0.25-0.5 mg total) by mouth 2 (two) times daily as needed for anxiety.  Marland Kitchen apixaban (ELIQUIS) 5 MG TABS tablet Take 5 mg by mouth 2 (two) times daily.  . Ascorbic Acid (VITAMIN C) 1000 MG tablet Take 1,000 mg by mouth daily.  Marland Kitchen azelastine (ASTELIN) 0.1 % nasal spray Place 2 sprays into both nostrils at bedtime as needed for rhinitis. Use in each nostril as directed  . budesonide-formoterol (SYMBICORT)  160-4.5 MCG/ACT inhaler Inhale 2 puffs into the lungs 2 (two) times daily.  . cholecalciferol (VITAMIN D) 1000 units tablet Take 1,000 Units by mouth daily.  Marland Kitchen dexamethasone (DECADRON) 4 MG tablet Take 1 tab two times a day the day before Alimta chemo. Take 2 tabs two times a day starting the day after chemo for 3 days.  Marland Kitchen diltiazem (CARDIZEM) 30 MG tablet Take 1 tablet (30 mg total) by mouth 4 (four) times daily. (Patient taking differently: Take 30 mg by mouth 3 (three) times daily. )  . DULoxetine (CYMBALTA) 30 MG capsule TAKE 1 CAPSULE DAILY BY MOUTH.  . folic acid (FOLVITE) 1 MG tablet Take 1 tablet (1 mg total) by mouth daily. Take daily starting 5-7 days before Alimta chemo. Continue until 21 days after Alimta completed.  Marland Kitchen ipratropium-albuterol (DUONEB) 0.5-2.5 (3) MG/3ML SOLN Take 3 mLs by nebulization every 6 (six) hours as needed (Dyspnea/wheezing).  Marland Kitchen lactulose (CHRONULAC) 10 GM/15ML solution Take 15 mLs (10 g total) by mouth every 4 (four) hours as needed for mild constipation.  . ondansetron (ZOFRAN) 8 MG tablet Take 1 tablet (8 mg total) by mouth every 8 (eight) hours as needed for nausea or vomiting.  . polyethylene glycol powder (GLYCOLAX/MIRALAX) powder Take 17 g by mouth 2 (two) times daily. (Patient taking differently: Take 17 g by mouth 2 (two) times daily as needed (for constipation.). )  . prochlorperazine (COMPAZINE) 10 MG tablet Take 1 tablet (10 mg total) by mouth every 6 (six) hours as needed for nausea or vomiting.  Marland Kitchen  Propylene Glycol (SYSTANE BALANCE OP) Apply 2 drops to eye 3 (three) times daily as needed (for dry/irritated eyes).   Marland Kitchen sulfamethoxazole-trimethoprim (BACTRIM DS,SEPTRA DS) 800-160 MG tablet Take 1 tablet by mouth 2 (two) times daily. For 5 days  . [DISCONTINUED] DULoxetine (CYMBALTA) 30 MG capsule Take 1 capsule (30 mg total) by mouth daily.   Facility-Administered Encounter Medications as of 06/30/2017  Medication  . cyanocobalamin ((VITAMIN B-12))  injection 1,000 mcg    Allergies as of 06/30/2017 - Review Complete 06/30/2017  Allergen Reaction Noted  . Adhesive [tape] Other (See Comments) 03/26/2011  . Amoxicillin Other (See Comments)   . Ciprofloxacin Other (See Comments)   . Ultram [tramadol] Other (See Comments) 04/02/2012    Past Medical History:  Diagnosis Date  . Adenocarcinoma of left lung metastatic to liver ([facility]) 04/24/2017  . Anxiety   . Arthritis   . Atrial fibrillation ([facility])   . Cardiac arrhythmia due to congenital heart disease   . Chronic constipation   . COPD (chronic obstructive pulmonary disease) ([facility])   . Depression   . GERD (gastroesophageal reflux disease)   . Hemorrhoid   . Hyperlipidemia   . Idiopathic hypotension   . Large cell carcinoma of left lung, stage 4 ([facility]) 04/24/2017  . Lightheadedness 11/2015  . Lung cancer metastatic to bone ([facility]) 04/24/2017  . Mass of lower lobe of right lung 03/25/2017  . Melanoma of skin ([facility]) 12/06/2016  . Mycotic toenails 10/27/2012  . Parkinson disease ([facility])   . Recurrent UTI   . 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 Varicose veins     Past Surgical History:  Procedure Laterality Date  . APPENDECTOMY  84 years old  . COLON RESECTION  2008  . melanoma removal Left    left cheek  . vitriectomy  08-2015    Family History  Problem Relation Age of Onset  . Asthma Brother   . Alcohol abuse Brother   . Throat cancer Father   . Alcohol abuse Father   . Lupus Daughter   . Bipolar disorder Daughter   . Lupus Daughter   . Anxiety disorder Daughter   . Alcohol abuse Sister   . Alcohol abuse Maternal Grandfather   . Alcohol abuse Paternal Grandmother   . Breast cancer Sister   . Colon cancer Neg Hx     Social History   Socioeconomic History  . Marital status: Widowed    Spouse name: Not on file  . Number of children: 2  . Years of education: Not on file  . Highest education level: Not on file  Social Needs  . Financial resource strain: Not on file  .  Food insecurity - worry: Not on file  . Food insecurity - inability: Not on file  . Transportation needs - medical: Not on file  . Transportation needs - non-medical: Not on file  Occupational History  . Occupation: retiredFirefighter , Animator  Tobacco Use  . Smoking status: Former Smoker    Packs/day: 2.00    Years: 30.00    Pack years: 60.00    Types: Cigarettes    Last attempt to quit: 04/21/1980    Years since quitting: 37.2  . Smokeless tobacco: Never Used  . Tobacco comment: onset age 82 -74, up to > 1ppd (almost 2 ppd)  Substance and Sexual Activity  . Alcohol use: No    Alcohol/week: 0.0 oz  . Drug use: No  . Sexual activity: No  Other  Topics Concern  . Not on file  Social History Narrative   Widowed, lives alone. 1 child in [place] and 1 in [place]. 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   Still drives as of 39-03    Review of systems: Review of Systems  Constitutional: Negative for fever and chills.  HENT: Negative.   Eyes: Negative for blurred vision.  Respiratory: as per HPI  Cardiovascular: Negative for chest pain and palpitations.  Gastrointestinal: Negative for vomiting, diarrhea, blood per rectum. Genitourinary: Negative for dysuria, urgency, frequency and hematuria.  Musculoskeletal: Negative for myalgias, back pain and joint pain.  Skin: Negative for itching and rash.  Neurological: Negative for dizziness, tremors, focal weakness, seizures and loss of consciousness.  Endo/Heme/Allergies: Negative for environmental allergies.  Psychiatric/Behavioral: Negative for depression, suicidal ideas and hallucinations.  All other systems reviewed and are negative.  Physical Exam: Blood pressure 134/72, pulse (!) 101, height 5\' 10"  (1.778 m), weight 148 lb (67.1 kg), SpO2 99 %. Gen:      No acute distress HEENT:  EOMI, sclera anicteric Neck:     No masses; no thyromegaly Lungs:    Bilateral expiratory wheeze CV:         Regular rate  and rhythm; no murmurs Abd:      + bowel sounds; soft, non-tender; no palpable masses, no distension Ext:    No edema; adequate peripheral perfusion Skin:      Warm and dry; no rash Neuro: alert and oriented x 3 Psych: normal mood and affect Data Reviewed: PFTs 05/14/11 FVC 2.79 [79%), FEV1 1.91 [76%), F/F 69. Mild obstructive defect.  PFTs 02/19/17 FVC 2.60 [81%], FEV1 2.0 [83%], F/F 77, TLC 109%, RV/TLC 125%, DLCO 49% Minimal obstructive airway disease with hyperinflation, moderate-severe diffusion impairment  CT chest 03/17/17- right lower lobe 4.2 cm mass with right hilar lymphadenopathy. PET scan 00/9/23- hypermetabolic right lower lobe mass.  Metastasis to right hilar, subcarinal and liver. PET scan 06/11/17- response to therapy with reduction in size and metabolic activity of the right lower lobe mass and mediastinal, hilar lymph nodes.  Hepatic metastases no longer visible.  Ultrasound liver biopsy 03/23/17-Non-small, neuroendocrine cancer.  FENO 02/19/17- 20  Echocardiogram 03/05/15 The right ventricular systolic pressure was increased consistent with moderate pulmonary hypertension  Echocardiogram 11/29/15 LV cavity is normal in size, borderline global hypokinesis with low normal LVEF.  Left atrial cavity is severely dilated.  Possible PFO with small left to right shunt. Right atrial cavity severely dilated Mild to moderate MR.  Mild to moderate TR, mild pulmonary hypertension.  Pulmonary artery systolic pressure is 32  CBC 30/07/62-[id number] eosinophilic count 625  Assessment:  COPD GOLD B with exacerbation Has worsening dyspnea with wheezing on examination We will get a chest x-ray.  Stop Symbicort and start trelegy inhaler. Give a short prednisone taper.   Stage IV Large cell neuroendocrine cancer On chemotherapy.  Plan/Recommendations: - Change symbicort to trelegy - Chest x ray - Prednisone taper starting at 40 mg. Reduce dose by 10 mg every 3 days  Marshell Garfinkel MD [facility] Pager [phone] 06/30/2017, 3:11 PM  CC: Mackie Pai, PA-C   Addendum: Received clinic note from Dr. Einar Gip dated 03/04/17  Impression-symptoms likely combination of lung disease, deconditioning, Parkinson's, chronic diastolic heart failure from A. fib.  She will be started on Florinef for dizziness Repeat echocardiogram and reassess.

## 2017-06-30 NOTE — Patient Instructions (Signed)
You likely has COPD exacerbation today We will give you a prednisone taper starting at 40 mg.  Reduce dose by 10 mg every 3 days Stop Symbicort.  We will start you on trelegy inhaler We will get a chest x-ray today Follow-up in 3 months.

## 2017-07-01 ENCOUNTER — Telehealth: Payer: Self-pay | Admitting: *Deleted

## 2017-07-01 NOTE — Telephone Encounter (Signed)
Patient saw her physician yesterday for her COPD. He prescribed prednisone and an inhaler, Trelegy. She wants to make sure these medications are okay to take with her chemo regimen.  Reviewed the prescriptions with Dr Marin Olp. He is okay with patient starting them  Patient is aware of Dr Antonieta Pert okay.

## 2017-07-06 ENCOUNTER — Inpatient Hospital Stay (HOSPITAL_BASED_OUTPATIENT_CLINIC_OR_DEPARTMENT_OTHER): Payer: Medicare Other | Admitting: Family

## 2017-07-06 ENCOUNTER — Inpatient Hospital Stay: Payer: Medicare Other

## 2017-07-06 ENCOUNTER — Inpatient Hospital Stay: Payer: Medicare Other | Attending: Hematology & Oncology

## 2017-07-06 ENCOUNTER — Other Ambulatory Visit: Payer: Self-pay

## 2017-07-06 VITALS — BP 108/77 | HR 98 | Temp 97.3°F | Resp 19 | Wt 148.0 lb

## 2017-07-06 DIAGNOSIS — F419 Anxiety disorder, unspecified: Secondary | ICD-10-CM | POA: Diagnosis not present

## 2017-07-06 DIAGNOSIS — C3492 Malignant neoplasm of unspecified part of left bronchus or lung: Secondary | ICD-10-CM

## 2017-07-06 DIAGNOSIS — Z79899 Other long term (current) drug therapy: Secondary | ICD-10-CM | POA: Diagnosis not present

## 2017-07-06 DIAGNOSIS — C7951 Secondary malignant neoplasm of bone: Secondary | ICD-10-CM

## 2017-07-06 DIAGNOSIS — Z5112 Encounter for antineoplastic immunotherapy: Secondary | ICD-10-CM | POA: Insufficient documentation

## 2017-07-06 DIAGNOSIS — C787 Secondary malignant neoplasm of liver and intrahepatic bile duct: Secondary | ICD-10-CM | POA: Diagnosis not present

## 2017-07-06 DIAGNOSIS — Z880 Allergy status to penicillin: Secondary | ICD-10-CM | POA: Diagnosis not present

## 2017-07-06 DIAGNOSIS — R5383 Other fatigue: Secondary | ICD-10-CM | POA: Diagnosis not present

## 2017-07-06 DIAGNOSIS — Z5111 Encounter for antineoplastic chemotherapy: Secondary | ICD-10-CM | POA: Diagnosis not present

## 2017-07-06 DIAGNOSIS — C3490 Malignant neoplasm of unspecified part of unspecified bronchus or lung: Secondary | ICD-10-CM

## 2017-07-06 DIAGNOSIS — C3431 Malignant neoplasm of lower lobe, right bronchus or lung: Secondary | ICD-10-CM | POA: Diagnosis not present

## 2017-07-06 DIAGNOSIS — J449 Chronic obstructive pulmonary disease, unspecified: Secondary | ICD-10-CM | POA: Insufficient documentation

## 2017-07-06 DIAGNOSIS — Z888 Allergy status to other drugs, medicaments and biological substances status: Secondary | ICD-10-CM

## 2017-07-06 LAB — CBC WITH DIFFERENTIAL (CANCER CENTER ONLY)
BASOS PCT: 0 %
Basophils Absolute: 0 10*3/uL (ref 0.0–0.1)
Eosinophils Absolute: 0 10*3/uL (ref 0.0–0.5)
Eosinophils Relative: 0 %
HEMATOCRIT: 31.8 % — AB (ref 34.8–46.6)
Hemoglobin: 10.5 g/dL — ABNORMAL LOW (ref 11.6–15.9)
Lymphocytes Relative: 11 %
Lymphs Abs: 0.7 10*3/uL — ABNORMAL LOW (ref 0.9–3.3)
MCH: 32.3 pg (ref 26.0–34.0)
MCHC: 33 g/dL (ref 32.0–36.0)
MCV: 97.8 fL (ref 81.0–101.0)
MONO ABS: 0.8 10*3/uL (ref 0.1–0.9)
MONOS PCT: 13 %
NEUTROS ABS: 4.6 10*3/uL (ref 1.5–6.5)
Neutrophils Relative %: 76 %
Platelet Count: 260 10*3/uL (ref 145–400)
RBC: 3.25 MIL/uL — ABNORMAL LOW (ref 3.70–5.32)
RDW: 17.4 % — AB (ref 11.1–15.7)
WBC Count: 6 10*3/uL (ref 3.9–10.0)

## 2017-07-06 LAB — CMP (CANCER CENTER ONLY)
ALBUMIN: 3.2 g/dL — AB (ref 3.5–5.0)
ALT: 21 U/L (ref 10–47)
ANION GAP: 9 (ref 5–15)
AST: 20 U/L (ref 11–38)
Alkaline Phosphatase: 77 U/L (ref 26–84)
BUN: 16 mg/dL (ref 7–22)
CALCIUM: 9.4 mg/dL (ref 8.0–10.3)
CO2: 27 mmol/L (ref 18–33)
Chloride: 102 mmol/L (ref 98–108)
Creatinine: 0.9 mg/dL (ref 0.60–1.20)
Glucose, Bld: 140 mg/dL — ABNORMAL HIGH (ref 73–118)
Potassium: 3.8 mmol/L (ref 3.3–4.7)
Sodium: 138 mmol/L (ref 128–145)
Total Bilirubin: 0.5 mg/dL (ref 0.2–1.6)
Total Protein: 6.2 g/dL — ABNORMAL LOW (ref 6.4–8.1)

## 2017-07-06 MED ORDER — HEPARIN SOD (PORK) LOCK FLUSH 100 UNIT/ML IV SOLN
500.0000 [IU] | Freq: Once | INTRAVENOUS | Status: DC | PRN
  Filled 2017-07-06: qty 5

## 2017-07-06 MED ORDER — CARBOPLATIN CHEMO INJECTION 450 MG/45ML
300.0000 mg | Freq: Once | INTRAVENOUS | Status: AC
  Administered 2017-07-06: 300 mg via INTRAVENOUS
  Filled 2017-07-06: qty 30

## 2017-07-06 MED ORDER — SODIUM CHLORIDE 0.9 % IV SOLN
375.0000 mg/m2 | Freq: Once | INTRAVENOUS | Status: AC
  Administered 2017-07-06: 700 mg via INTRAVENOUS
  Filled 2017-07-06: qty 20

## 2017-07-06 MED ORDER — SODIUM CHLORIDE 0.9% FLUSH
10.0000 mL | INTRAVENOUS | Status: DC | PRN
  Filled 2017-07-06: qty 10

## 2017-07-06 MED ORDER — SODIUM CHLORIDE 0.9 % IV SOLN
200.0000 mg | Freq: Once | INTRAVENOUS | Status: AC
  Administered 2017-07-06: 200 mg via INTRAVENOUS
  Filled 2017-07-06: qty 8

## 2017-07-06 MED ORDER — SODIUM CHLORIDE 0.9 % IV SOLN
Freq: Once | INTRAVENOUS | Status: AC
  Administered 2017-07-06: 13:00:00 via INTRAVENOUS
  Filled 2017-07-06: qty 5

## 2017-07-06 MED ORDER — PALONOSETRON HCL INJECTION 0.25 MG/5ML
0.2500 mg | Freq: Once | INTRAVENOUS | Status: AC
  Administered 2017-07-06: 0.25 mg via INTRAVENOUS

## 2017-07-06 MED ORDER — PALONOSETRON HCL INJECTION 0.25 MG/5ML
INTRAVENOUS | Status: AC
  Filled 2017-07-06: qty 5

## 2017-07-06 MED ORDER — SODIUM CHLORIDE 0.9 % IV SOLN
Freq: Once | INTRAVENOUS | Status: AC
  Administered 2017-07-06: 11:00:00 via INTRAVENOUS

## 2017-07-06 NOTE — Patient Instructions (Signed)
[facility] Discharge Instructions for Patients Receiving Chemotherapy  Today you received the following chemotherapy agents Keytruda, Alimta, Carbo   To help prevent nausea and vomiting after your treatment, we encourage you to take your nausea medication    If you develop nausea and vomiting that is not controlled by your nausea medication, call the clinic.   BELOW ARE SYMPTOMS THAT SHOULD BE REPORTED IMMEDIATELY:  *FEVER GREATER THAN 100.5 F  *CHILLS WITH OR WITHOUT FEVER  NAUSEA AND VOMITING THAT IS NOT CONTROLLED WITH YOUR NAUSEA MEDICATION  *UNUSUAL SHORTNESS OF BREATH  *UNUSUAL BRUISING OR BLEEDING  TENDERNESS IN MOUTH AND THROAT WITH OR WITHOUT PRESENCE OF ULCERS  *URINARY PROBLEMS  *BOWEL PROBLEMS  UNUSUAL RASH Items with * indicate a potential emergency and should be followed up as soon as possible.  Feel free to call the clinic should you have any questions or concerns. The clinic phone number is [phone].  Please show the [facility] at check-in to the Emergency Department and triage nurse.

## 2017-07-06 NOTE — Progress Notes (Signed)
Hematology and Oncology Follow Up Visit  VERNIE VINCIGUERRA [id number] 08-02-1927 84 y.o. 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 Fleet is here today with her daughter for follow-up and treatment (cycle 4). She has had some fatigue since her last treatment and will nap as needed.  She is still experiencing the same level of SOB. She had a chest xray a few days ago which showed COPD and then the known right lower lobe cancer with no other abnormality. She has seen her pulmonologist and started 2 new inhaler which she does not feel are helping. She plans to follow back up with pulmonology this week.   She has had some pressure "in her eyes". This resolves when she uses her dry eye drops. She states that if this persists she will follow-up with her ophthalmologist.  No fever, chills, n/v, cough, rash, dizziness, chest pain, palpitations, abdominal pain or changes in bowel or bladder habits.  She has felt anxious and has a tremor she feels is due to nerves. She has been taking her Cymbalta but not her Xanax. She will try taking her Xanax as prescribed for anxiety and see if this helps.  No swelling, tenderness, numbness or tingling in her extremities.  She has had no episodes of bleeding, no bruising or petechiae. No lymphadenopathy noted on exam.  She has maintained a good appetite but states that she needs to hydrate better. Her weight is   ECOG Performance Status: 1 - Symptomatic but completely ambulatory  Medications:  Allergies as of 07/06/2017      Reactions   Adhesive [tape] Other (See Comments)   Caused blisters   Amoxicillin Other (See Comments)   Has patient had a PCN reaction causing immediate rash, facial/tongue/throat swelling, SOB or  lightheadedness with hypotension: Yes Has patient had a PCN reaction causing severe rash involving mucus membranes or skin necrosis: No Has patient had a PCN reaction that required hospitalization No Has patient had a PCN reaction occurring within the last 10 years: Yes If all of the above answers are "NO", then may proceed with Cephalosporin use. Caused thrush   Ciprofloxacin Other (See Comments)   Caused thrush   Ultram [tramadol] Other (See Comments)   hypotension      Medication List        Accurate as of 07/06/17 10:19 AM. Always use your most recent med list.          acetaminophen 325 MG tablet Commonly known as:  TYLENOL Take 2 tablets (650 mg total) by mouth every 6 (six) hours as needed for mild pain (or Fever >/= 101).   albuterol 108 (90 Base) MCG/ACT inhaler Commonly known as:  VENTOLIN HFA Inhale 2 puffs into the lungs 2 (two) times daily as needed for wheezing or shortness of breath.   ALPRAZolam 0.5 MG tablet Commonly known as:  XANAX Take 0.5-1 tablets (0.25-0.5 mg total) by mouth 2 (two) times daily as needed for anxiety.   apixaban 5 MG Tabs tablet Commonly known as:  ELIQUIS Take 5 mg by mouth 2 (two) times daily.   azelastine 0.1 % nasal spray Commonly known as:  ASTELIN Place 2 sprays into both nostrils at bedtime as needed for rhinitis. Use in each nostril as directed   cholecalciferol  1000 units tablet Commonly known as:  VITAMIN D Take 1,000 Units by mouth daily.   dexamethasone 4 MG tablet Commonly known as:  DECADRON Take 1 tab two times a day the day before Alimta chemo. Take 2 tabs two times a day starting the day after chemo for 3 days.   diltiazem 30 MG tablet Commonly known as:  CARDIZEM Take 1 tablet (30 mg total) by mouth 4 (four) times daily.   DULoxetine 30 MG capsule Commonly known as:  CYMBALTA TAKE 1 CAPSULE DAILY BY MOUTH.   folic acid 1 MG tablet Commonly known as:  FOLVITE Take 1 tablet (1 mg total) by mouth daily.  Take daily starting 5-7 days before Alimta chemo. Continue until 21 days after Alimta completed.   ipratropium-albuterol 0.5-2.5 (3) MG/3ML Soln Commonly known as:  DUONEB Take 3 mLs by nebulization every 6 (six) hours as needed (Dyspnea/wheezing).   lactulose 10 GM/15ML solution Commonly known as:  CHRONULAC Take 15 mLs (10 g total) by mouth every 4 (four) hours as needed for mild constipation.   ondansetron 8 MG tablet Commonly known as:  ZOFRAN Take 1 tablet (8 mg total) by mouth every 8 (eight) hours as needed for nausea or vomiting.   polyethylene glycol powder powder Commonly known as:  GLYCOLAX/MIRALAX Take 17 g by mouth 2 (two) times daily.   predniSONE 10 MG tablet Commonly known as:  DELTASONE 4 tabs x 3 days, 3 tabs x 3 days, 2 tabs x 3 days, 1 tab x 3 days then stop   prochlorperazine 10 MG tablet Commonly known as:  COMPAZINE Take 1 tablet (10 mg total) by mouth every 6 (six) hours as needed for nausea or vomiting.   sulfamethoxazole-trimethoprim 800-160 MG tablet Commonly known as:  BACTRIM DS,SEPTRA DS Take 1 tablet by mouth 2 (two) times daily. For 5 days   SYSTANE BALANCE OP Apply 2 drops to eye 3 (three) times daily as needed (for dry/irritated eyes).   vitamin C 1000 MG tablet Take 1,000 mg by mouth daily.       Allergies:  Allergies  Allergen Reactions  . Adhesive [Tape] Other (See Comments)    Caused blisters  . Amoxicillin Other (See Comments)    Has patient had a PCN reaction causing immediate rash, facial/tongue/throat swelling, SOB or lightheadedness with hypotension: Yes Has patient had a PCN reaction causing severe rash involving mucus membranes or skin necrosis: No Has patient had a PCN reaction that required hospitalization No Has patient had a PCN reaction occurring within the last 10 years: Yes If all of the above answers are "NO", then may proceed with Cephalosporin use.   Caused thrush  . Ciprofloxacin Other (See Comments)     Caused thrush  . Ultram [Tramadol] Other (See Comments)    hypotension    Past Medical History, Surgical history, Social history, and Family History were reviewed and updated.  Review of Systems: All other 10 point review of systems is negative.   Physical Exam:  vitals were not taken for this visit.   Wt Readings from Last 3 Encounters:  06/30/17 148 lb (67.1 kg)  06/15/17 145 lb (65.8 kg)  05/25/17 146 lb (66.2 kg)    Ocular: Sclerae unicteric, pupils equal, round and reactive to light Ear-nose-throat: Oropharynx clear, dentition fair Lymphatic: No cervical, supraclavicular or axillary adenopathy Lungs no rales or rhonchi, good excursion bilaterally Heart regular rate and rhythm, no murmur appreciated Abd soft, nontender, positive bowel sounds, no liver or spleen tip  palpated on exam, no fluid wave  MSK no focal spinal tenderness, no joint edema Neuro: non-focal, well-oriented, appropriate affect Breasts: Deferred   Lab Results  Component Value Date   WBC 6.0 07/06/2017   HGB 12.7 04/10/2017   HCT 31.8 (L) 07/06/2017   MCV 97.8 07/06/2017   PLT 260 07/06/2017   Lab Results  Component Value Date   FERRITIN 44.1 12/05/2016   IRON 47 12/05/2016   TIBC 159 (L) 04/10/2007   UIBC 148 04/10/2007   IRONPCTSAT 7 (L) 04/10/2007   Lab Results  Component Value Date   RBC 3.25 (L) 07/06/2017   No results found for: Nils Pyle [facility] Lab Results  Component Value Date   IGGSERUM 854 02/12/2015   IGA 288 02/12/2015   IGMSERUM 217 02/12/2015   Lab Results  Component Value Date   TOTALPROTELP 6.8 02/12/2015   ALBUMINELP 4.1 02/12/2015   A1GS 0.3 02/12/2015   A2GS 0.7 02/12/2015   BETS 0.5 02/12/2015   BETA2SER 0.3 02/12/2015   GAMS 0.9 02/12/2015   SPEI SEE NOTE 02/12/2015     Chemistry      Component Value Date/Time   NA 142 06/15/2017 0933   NA 147 (H) 04/10/2017 1141   K 3.1 (L) 06/15/2017 0933   K 3.6 04/10/2017 1141   CL 106  06/15/2017 0933   CL 107 04/10/2017 1141   CO2 25 06/15/2017 0933   CO2 28 04/10/2017 1141   BUN 16 06/15/2017 0933   BUN 14 04/10/2017 1141   CREATININE 0.90 06/15/2017 0933   CREATININE 1.0 04/10/2017 1141   GLU 68 09/05/2016      Component Value Date/Time   CALCIUM 9.4 06/15/2017 0933   CALCIUM 9.5 04/10/2017 1141   ALKPHOS 91 (H) 06/15/2017 0933   ALKPHOS 84 04/10/2017 1141   AST 22 06/15/2017 0933   ALT 18 06/15/2017 0933   ALT 23 04/10/2017 1141   BILITOT 0.5 06/15/2017 0933       Impression and Plan: Ms. Feuerstein is a very pleasant 84 yo caucasian female with non-small cell metastatic bronchogenic carcinoma with hepatic, lymph node and bone metastasis. She has had some fatigue since her last treatment and is taking breaks to rest as needed.  She will hydrated more and try to walk for some exercise.  We will proceed with cycle 4 of treatment today as planned.  We will repeat her PET scan in another 2 weeks to re-evaluate her response to therapy.  She has her current treatment and appointment schedule and will be back again on 07/27/17.  Both she and her daughters know to contact our office with any questions or concerns and to go to the [facility] ED in the event of an emergency.   Laverna Peace, NP 3/18/201910:19 AM

## 2017-07-10 ENCOUNTER — Telehealth: Payer: Self-pay | Admitting: *Deleted

## 2017-07-10 NOTE — Telephone Encounter (Signed)
Patient c/o chest congestion that is hard to clear. She had a chemo treatment on Monday, and since then the congestion has gotten worse. She finds it difficult to clear secretions with coughing. No fever.   She recently saw her pulmonologist on 06/30/2017 and was prescribed several new inhalers, and prednisone taper.   Spoke with Laverna Peace NP and she would like the patient to increase fluid intake and use Mucinex over the counter. She would also like her to follow up with her pulmonologist to make sure symptoms are not be related to her COPD or new medication.   Patient will start taking mucinex with increased water intake. She also agrees to check with her pulmonologist regarding her respiratory symptoms.

## 2017-07-13 ENCOUNTER — Ambulatory Visit: Payer: Self-pay | Admitting: Medical

## 2017-07-13 ENCOUNTER — Ambulatory Visit (INDEPENDENT_AMBULATORY_CARE_PROVIDER_SITE_OTHER): Payer: Medicare Other | Admitting: Medical

## 2017-07-13 ENCOUNTER — Ambulatory Visit (HOSPITAL_BASED_OUTPATIENT_CLINIC_OR_DEPARTMENT_OTHER)
Admission: RE | Admit: 2017-07-13 | Discharge: 2017-07-13 | Disposition: A | Discharge status: Home / Self Care | Payer: Medicare Other | Source: Ambulatory Visit | Attending: Medical | Admitting: Medical

## 2017-07-13 ENCOUNTER — Telehealth: Payer: Self-pay | Admitting: *Deleted

## 2017-07-13 ENCOUNTER — Encounter: Payer: Self-pay | Admitting: Medical

## 2017-07-13 VITALS — BP 103/66 | HR 117 | Temp 97.7°F | Resp 16 | Ht 70.0 in | Wt 150.8 lb

## 2017-07-13 DIAGNOSIS — K5900 Constipation, unspecified: Secondary | ICD-10-CM | POA: Diagnosis not present

## 2017-07-13 DIAGNOSIS — C3491 Malignant neoplasm of unspecified part of right bronchus or lung: Secondary | ICD-10-CM

## 2017-07-13 DIAGNOSIS — M4186 Other forms of scoliosis, lumbar region: Secondary | ICD-10-CM | POA: Insufficient documentation

## 2017-07-13 DIAGNOSIS — R5383 Other fatigue: Secondary | ICD-10-CM | POA: Diagnosis not present

## 2017-07-13 DIAGNOSIS — J9811 Atelectasis: Secondary | ICD-10-CM | POA: Diagnosis not present

## 2017-07-13 DIAGNOSIS — M47816 Spondylosis without myelopathy or radiculopathy, lumbar region: Secondary | ICD-10-CM | POA: Diagnosis not present

## 2017-07-13 NOTE — Progress Notes (Signed)
Subjective:    Patient ID: [name], female    DOB: [birth year], 84 y.o.   MRN: [id number]  HPI   Pt in for some recent abdomen pain for about one week. Pain is mild.   Pt has tried miralax and lactulose.  In the past pt states her bowel pattern was every 2-3 days.   When she started he chemotherapy for Cancer she started to get constipated.  Typically 3 days after chemo was when she got constipated.  Pt has upcoming petscan on July 20, 2017.  Pt does have history of bowel obstruction 10 years ago. Surgery for that. Also had history of appendectomy at 84 years of age.    Pt states 7 days since last bowel bowel movement.  Pt states she is fatigued. After each chemo treatment she gets very tired.   Pt admits not drinking fluids like she shoud.      Review of Systems  Constitutional: Positive for fatigue. Negative for chills and fever.  Respiratory: Negative for cough, chest tightness, shortness of breath and wheezing.   Cardiovascular: Negative for chest pain and palpitations.  Gastrointestinal: Positive for constipation and nausea. Negative for abdominal pain, diarrhea and vomiting.       Rare nausea. No vomiting over past week.   Genitourinary: Negative for dysuria, flank pain and frequency.  Musculoskeletal: Negative for back pain.  Skin: Negative for rash and wound.  Neurological: Negative for dizziness and light-headedness.  Hematological: Negative for adenopathy. Does not bruise/bleed easily.  Psychiatric/Behavioral: Negative for agitation, behavioral problems, dysphoric mood, self-injury and suicidal ideas. The patient is not nervous/anxious.     Past Medical History:  Diagnosis Date  . Adenocarcinoma of left lung metastatic to liver ([place]) 04/24/2017  . Anxiety   . Arthritis   . Atrial fibrillation ([place])   . Cardiac arrhythmia due to congenital heart disease   . Chronic constipation   . COPD (chronic obstructive pulmonary disease) ([place])   .  Depression   . GERD (gastroesophageal reflux disease)   . Hemorrhoid   . Hyperlipidemia   . Idiopathic hypotension   . Large cell carcinoma of left lung, stage 4 ([place]) 04/24/2017  . Lightheadedness 11/2015  . Lung cancer metastatic to bone ([place]) 04/24/2017  . Mass of lower lobe of right lung 03/25/2017  . Melanoma of skin ([place]) 12/06/2016  . Mycotic toenails 10/27/2012  . Parkinson disease ([place])   . Recurrent UTI   . 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 Varicose veins      Social History   Socioeconomic History  . Marital status: Widowed    Spouse name: Not on file  . Number of children: 2  . Years of education: Not on file  . Highest education level: Not on file  Occupational History  . Occupation: retiredFirefighter , Animator  Social Needs  . Financial resource strain: Not on file  . Food insecurity:    Worry: Not on file    Inability: Not on file  . Transportation needs:    Medical: Not on file    Non-medical: Not on file  Tobacco Use  . Smoking status: Former Smoker    Packs/day: 2.00    Years: 30.00    Pack years: 60.00    Types: Cigarettes    Last attempt to quit: 04/21/1980    Years since quitting: 37.2  . Smokeless tobacco: Never Used  . Tobacco comment: onset age 31 -50, up to > 1ppd (  almost 2 ppd)  Substance and Sexual Activity  . Alcohol use: No    Alcohol/week: 0.0 oz  . Drug use: No  . Sexual activity: Never  Lifestyle  . Physical activity:    Days per week: Not on file    Minutes per session: Not on file  . Stress: Not on file  Relationships  . Social connections:    Talks on phone: Not on file    Gets together: Not on file    Attends religious service: Not on file    Active member of club or organization: Not on file    Attends meetings of clubs or organizations: Not on file    Relationship status: Not on file  . Intimate partner violence:    Fear of current or ex partner: Not on file    Emotionally abused: Not on file     Physically abused: Not on file    Forced sexual activity: Not on file  Other Topics Concern  . Not on file  Social History Narrative   Widowed, lives alone. 1 child in [place] and 1 in [place]. 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   Still drives as of [age]    Past Surgical History:  Procedure Laterality Date  . APPENDECTOMY  84 years old  . COLON RESECTION  2008  . melanoma removal Left    left cheek  . vitriectomy  08-2015    Family History  Problem Relation Age of Onset  . Asthma Brother   . Alcohol abuse Brother   . Throat cancer Father   . Alcohol abuse Father   . Lupus Daughter   . Bipolar disorder Daughter   . Lupus Daughter   . Anxiety disorder Daughter   . Alcohol abuse Sister   . Alcohol abuse Maternal Grandfather   . Alcohol abuse Paternal Grandmother   . Breast cancer Sister   . Colon cancer Neg Hx     Allergies  Allergen Reactions  . Adhesive [Tape] Other (See Comments)    Caused blisters  . Amoxicillin Other (See Comments)    Has patient had a PCN reaction causing immediate rash, facial/tongue/throat swelling, SOB or lightheadedness with hypotension: Yes Has patient had a PCN reaction causing severe rash involving mucus membranes or skin necrosis: No Has patient had a PCN reaction that required hospitalization No Has patient had a PCN reaction occurring within the last 10 years: Yes If all of the above answers are "NO", then may proceed with Cephalosporin use.   Caused thrush  . Ciprofloxacin Other (See Comments)    Caused thrush  . Ultram [Tramadol] Other (See Comments)    hypotension    Current Outpatient Medications on File Prior to Visit  Medication Sig Dispense Refill  . acetaminophen (TYLENOL) 325 MG tablet Take 2 tablets (650 mg total) by mouth every 6 (six) hours as needed for mild pain (or Fever >/= 101).    Marland Kitchen albuterol (VENTOLIN HFA) 108 (90 BASE) MCG/ACT inhaler Inhale 2 puffs into the lungs 2 (two) times daily as needed for  wheezing or shortness of breath. 18 g 4  . ALPRAZolam (XANAX) 0.5 MG tablet Take 0.5-1 tablets (0.25-0.5 mg total) by mouth 2 (two) times daily as needed for anxiety. 30 tablet 1  . apixaban (ELIQUIS) 5 MG TABS tablet Take 5 mg by mouth 2 (two) times daily.    . Ascorbic Acid (VITAMIN C) 1000 MG tablet Take 1,000 mg by mouth daily.    Marland Kitchen  azelastine (ASTELIN) 0.1 % nasal spray Place 2 sprays into both nostrils at bedtime as needed for rhinitis. Use in each nostril as directed 30 mL 3  . Cholecalciferol (VITAMIN D) 400 UNIT/ML LIQD Take 400 Units by mouth daily.     Marland Kitchen dexamethasone (DECADRON) 4 MG tablet Take 1 tab two times a day the day before Alimta chemo. Take 2 tabs two times a day starting the day after chemo for 3 days. 30 tablet 1  . diltiazem (CARDIZEM) 30 MG tablet Take 1 tablet (30 mg total) by mouth 4 (four) times daily. (Patient taking differently: Take 30 mg by mouth 3 (three) times daily. ) 360 tablet 0  . DULoxetine (CYMBALTA) 30 MG capsule TAKE 1 CAPSULE DAILY BY MOUTH. 90 capsule 1  . folic acid (FOLVITE) 1 MG tablet Take 1 tablet (1 mg total) by mouth daily. Take daily starting 5-7 days before Alimta chemo. Continue until 21 days after Alimta completed. 100 tablet 3  . lactulose (CHRONULAC) 10 GM/15ML solution Take 15 mLs (10 g total) by mouth every 4 (four) hours as needed for mild constipation. 240 mL 0  . ondansetron (ZOFRAN) 8 MG tablet Take 1 tablet (8 mg total) by mouth every 8 (eight) hours as needed for nausea or vomiting. 30 tablet 0  . polyethylene glycol powder (GLYCOLAX/MIRALAX) powder Take 17 g by mouth 2 (two) times daily. (Patient taking differently: Take 17 g by mouth 2 (two) times daily as needed (for constipation.). ) 255 g 0  . prochlorperazine (COMPAZINE) 10 MG tablet Take 1 tablet (10 mg total) by mouth every 6 (six) hours as needed for nausea or vomiting. 90 tablet 0  . Propylene Glycol (SYSTANE BALANCE OP) Apply 2 drops to eye 3 (three) times daily as needed (for  dry/irritated eyes).      Current Facility-Administered Medications on File Prior to Visit  Medication Dose Route Frequency Provider Last Rate Last Dose  . cyanocobalamin ((VITAMIN B-12)) injection 1,000 mcg  1,000 mcg Intramuscular Once [place], Sarah M, NP        BP 103/66   Pulse (!) 117   Temp 97.7 F (36.5 C) (Oral)   Resp 16   Ht 5\' 10"  (1.778 m)   Wt 150 lb 12.8 oz (68.4 kg)   SpO2 98%   BMI 21.64 kg/m       Objective:   Physical Exam  General Appearance- Not in acute distress.  HEENT Eyes- Scleraeral/Conjuntiva-bilat- Not Yellow. Mouth & Throat- Normal.  Chest and Lung Exam Auscultation: Breath sounds:-Normal. Adventitious sounds:- No Adventitious sounds.  Cardiovascular Auscultation:Rythm - Regular. Heart Sounds -Normal heart sounds.  Abdomen Inspection:-Inspection Normal.  Palpation/Perucssion: Palpation and Percussion of the abdomen reveal- Non Tender, No Rebound tenderness, No rigidity(Guarding) and No Palpable abdominal masses.  Liver:-Normal.   Decreased bowel sounds but some present. Abdomen soft and not hard.  Spleen:- Normal.   Back- no cva tenderness.     Assessment & Plan:  You have history of severe constipation that appears to be consistent with bowel obstruction history.  However your physical exam findings do not correlate directly presently.  Decided we will get CBC + CMP as well as getting x-ray of abdomen stat.  With x-ray of abdomen will get  idea if there is some concern for obstruction.  With your recent chemo history and history of bowel obstruction, we need to proceed with caution.  If radiologist has concern for bowel obstruction or recommends immediate CT for other possible emergent condition then  may recommend ED evaluation.  If x-rays only show constipation then could try one bottle of magnesium citrate and see if have bowel movement by this evening.  However even in the scenario of benign appearing x-ray, I would recommend ED  evaluation if your symptoms change dramatically.  Follow-up in 4 days or as needed.(Possibly sooner depending on lab results and imaging results.)   Patient had a concern that if she started this regimen now that she would have loose stools throughout the night.  So after talking with her we agreed that she would chart the above regimen at 7 AM tomorrow morning.  Still discussed with patient that she should pay attention to any GI symptoms that might change or worsen tonight.  She has symptoms were to change dramatically then be seen in the emergency department.  She expressed understanding.  Also asked patient to update me by early tomorrow afternoon as to whether or not she had a bowel movement.  Also had discussion with patient about her level of fatigue regarding her lung cancer and her chemo treatments.  Presently she gets very fatigued after her chemo treatments.  3 more treatments pending.  She states has difficulty doing things around the house due to the level of fatigue.  We talked about options such as home health care versus palliative care.  At this time decided would contact palliative care to come out and see how they can help her  40 minutes total time spent with patient today.  50% of time spent counseling on her recent constipation versus possible bowel obstruction(prior to knowing the results of the x-ray).  Time spent explaining potential treatment plan in the event that she had a bowel obstruction.  Also after x-ray was done had question about her energy levels, upcoming chemo treatments and the need for assistance at her home.  Prescription regarding palliative care referral as well.  Mackie Pai, PA-C

## 2017-07-13 NOTE — Telephone Encounter (Signed)
Patient's daughter is calling because patient is having emotional disturbances this morning which she describes as a "Psychotic Episode".  She states the patient started screaming at her this morning "Get out of my house or I will call the police". When the daughter retreated back into her room, the patient then began crying and screaming "Please don't leave me. Come back!". The patient is extremely tearful with sobbing, with trembling episodes. She keeps repeating that she doesn't want her cat to die. The has brief moments of calm between episodes, but the daughter states she has no self awareness of her behavior. The daughter states she's never seen this kind of behavior in her mother before.   Reviewed with Dr Marin Olp. He wants patient to go to the ED for assessment. There is no obvious oncologic cause for this episode, but brain mets could be a possibility.   Returned call to the daughter. At that time the patient wanted to talk to me. During our conversation the patient stated she didn't want to go to the ED. She seemed pressured and rapid with her speech. She fixated on her bowels and stated several times that she just needed to walk around. When asked if she felt anxious or sad, she stated she was very anxious and overwhelmed. She repeated that there was just too much going on. She agreed to call her primary care physician only, but would not go to the ED. I spoke to the daughter again. She doesn't want to make her mother go where she doesn't want. The daughter and I agreed that if the primary care physician could see the patient today, that that would be okay. If unable to be seen, she needed to get the patient to the ED for workup.  She understands and will make sure that she is seen by a medical professional today.

## 2017-07-13 NOTE — Patient Instructions (Addendum)
You have history of severe constipation that appears to be consistent with bowel obstruction history.  However your physical exam findings do not correlate directly presently.  Decided we will get CBC + CMP as well as getting x-ray of abdomen stat.  With x-ray of abdomen will get idea if there is some concern for obstruction.  With your recent chemo history and history of bowel obstruction, we need to proceed with caution.  If radiologist has concern for bowel obstruction or recommends immediate CT for other possible emergent condition then may recommend ED evaluation.  If x-rays only show constipation then could try one bottle of magnesium citrate and see if have bowel movement by this evening.  However even in the scenario of benign appearing x-ray, I would recommend ED evaluation if your symptoms change dramatically.  Follow-up in 4 days or as needed.(Possibly sooner depending on lab results and imaging results.)   Patient did come back after the x-ray results and results showed more of stool impaction in the rectal area.  There were no overt concerns on x-ray findings of bowel obstruction.  Considered the need for disimpaction digitally but after talking with Dr. Charlett Blake we decided to try 4 ounces of warm prune juice immediately followed by 2 tablespoons of milk of magnesia.  Then have patient wait 2 hours and try Dulcolax suppository.  If additional 2 hours no bowel movement then could repeat the above steps.  Patient had a concern that if she started this regimen now that she would have loose stools throughout the night.  So after talking with her we agreed that she would chart the above regimen at 7 AM tomorrow morning.  Still discussed with patient that she should pay attention to any GI symptoms that might change or worsen tonight.  She has symptoms were to change dramatically then be seen in the emergency department.  She expressed understanding.  Also asked patient to update me by early tomorrow  afternoon as to whether or not she had a bowel movement.  Also had discussion with patient about her level of fatigue regarding her lung cancer and her chemo treatments.  Presently she gets very fatigued after her chemo treatments.  3 more treatments pending.  She states has difficulty doing things around the house due to the level of fatigue.  We talked about options such as home health care versus palliative care.  At this time decided would contact palliative care to come out and see how they can help her.

## 2017-07-14 ENCOUNTER — Telehealth: Payer: Self-pay | Admitting: *Deleted

## 2017-07-14 LAB — CBC WITH DIFFERENTIAL/PLATELET
BASOS PCT: 0.3 % (ref 0.0–3.0)
Basophils Absolute: 0 10*3/uL (ref 0.0–0.1)
EOS PCT: 0.4 % (ref 0.0–5.0)
Eosinophils Absolute: 0 10*3/uL (ref 0.0–0.7)
HEMATOCRIT: 33.3 % — AB (ref 36.0–46.0)
HEMOGLOBIN: 11.2 g/dL — AB (ref 12.0–15.0)
LYMPHS PCT: 25.1 % (ref 12.0–46.0)
Lymphs Abs: 1.2 10*3/uL (ref 0.7–4.0)
MCHC: 33.7 g/dL (ref 30.0–36.0)
MCV: 97.1 fl (ref 78.0–100.0)
MONO ABS: 0.2 10*3/uL (ref 0.1–1.0)
Monocytes Relative: 3.4 % (ref 3.0–12.0)
Neutro Abs: 3.3 10*3/uL (ref 1.4–7.7)
Neutrophils Relative %: 70.8 % (ref 43.0–77.0)
Platelets: 235 10*3/uL (ref 150.0–400.0)
RBC: 3.43 Mil/uL — ABNORMAL LOW (ref 3.87–5.11)
RDW: 18.4 % — AB (ref 11.5–15.5)
WBC: 4.7 10*3/uL (ref 4.0–10.5)

## 2017-07-14 LAB — COMPREHENSIVE METABOLIC PANEL
ALBUMIN: 3.4 g/dL — AB (ref 3.5–5.2)
ALT: 23 U/L (ref 0–35)
AST: 23 U/L (ref 0–37)
Alkaline Phosphatase: 68 U/L (ref 39–117)
BUN: 23 mg/dL (ref 6–23)
CALCIUM: 8.8 mg/dL (ref 8.4–10.5)
CHLORIDE: 95 meq/L — AB (ref 96–112)
CO2: 36 mEq/L — ABNORMAL HIGH (ref 19–32)
Creatinine, Ser: 0.89 mg/dL (ref 0.40–1.20)
GFR: 63.7 mL/min (ref >60.00)
Glucose, Bld: 99 mg/dL (ref 70–99)
POTASSIUM: 4.2 meq/L (ref 3.5–5.1)
Sodium: 139 mEq/L (ref 135–145)
Total Bilirubin: 0.4 mg/dL (ref 0.2–1.2)
Total Protein: 5.9 g/dL — ABNORMAL LOW (ref 6.0–8.3)

## 2017-07-14 NOTE — Telephone Encounter (Signed)
Patient calling this morning with continued c/o constipation, fatigue and emotional distress.  She was seen yesterday by her PCP. He started her on a bowel program, but the patient stated she did not want to start until today. He spoke with her about her fatigue and how it was an expected symptom after chemo. She also shared her concerns with needing to have caregivers and/or help in the home to help with her feelings of being overwhelmed and to help with her fatigue. Her PCP placed a referral to Palliative Care.   Specifically they asked about the bowel program. Patient wants to take lactulose with her milk of magnesia. Told her this was okay. Stressed that she needed to take both medicines every 4 hours until she had a BM and to not get off schedule. She wrote down instructions and stated she would follow them.  Reviewed all these discussions and orders placed per PCP. Instructed patient to follow through with these plans and reinforced what was said during yesterday's visit. Patient's daughter also stated they have contacted [facility] to initiate some home care.

## 2017-07-15 ENCOUNTER — Telehealth: Payer: Self-pay | Admitting: *Deleted

## 2017-07-15 NOTE — Telephone Encounter (Signed)
Patient calling the office again today about her constipation. She states she's had several bowel movements but feel like there's more there. She's still taking her lactulose and wants to make sure its okay.   Patient advised she is okay to continue lactulose until she feels "cleared out".   She also states she thinks she's too weak to get to her PET scan on Monday. Reviewed with the patient the importance of this scan to determine how the treatment is working. She understands and will try to keep this appointment.   She will call the office back if she has any further issues.

## 2017-07-17 ENCOUNTER — Telehealth: Payer: Self-pay | Admitting: *Deleted

## 2017-07-17 DIAGNOSIS — C7951 Secondary malignant neoplasm of bone: Secondary | ICD-10-CM

## 2017-07-17 DIAGNOSIS — C3490 Malignant neoplasm of unspecified part of unspecified bronchus or lung: Secondary | ICD-10-CM

## 2017-07-17 DIAGNOSIS — C439 Malignant melanoma of skin, unspecified: Secondary | ICD-10-CM

## 2017-07-17 NOTE — Progress Notes (Signed)
I contacted [facility] on yesterday and was told that they would call her as soon as possible .

## 2017-07-17 NOTE — Telephone Encounter (Signed)
EMS [facility] calling the office. He has been called out to patient's house initially for reports of patient making suicidal statements. He states that upon assessment, the patient isn't suicidal but rather making comments about stopping treatment and being unable to continue with the current course with the way it makes her feel. He states she is confused and he believes unsafe to leave home without any supervision. He wants to know what can be done to get a home health nurse to complete daily visits or other services to help patient be cared for at home.  Reviewed with him the last week and the concerns seen with patient. Explained that patient's mental health has been deteriorating and that we had asked patient to be worked up in the ED. While this office agrees that patient likely is unsafe by herself in the home, we are unable to provide nursing care as the problems being experienced are psychological related, and not a medical need. Explained that patient's PCP had made a referral to palliative care and they should be contacting patient to set up contact, but that they would not likely provide nursing care on a daily basis. Patient had also contacted a home health agency to come to her home for assistance, but deemed it too expensive. Expressed sympathy for patient situation, but explained that we were unaware of any mental health resources that patient qualified for or that we could provide on an emergent basis and asked EMS to take the patient to the ED.  At this time EMS states that the ED would only be appropriate for a patient who was a danger to herself or others, and this was more of a home need where patient could be managed with home visits. He did not feel like it was a good idea to transport to the hospital. He also asked about possible skilled nursing center admission. Explained that this was something that would need to be initiated by the patient or her family.  Numbers given to EMS  regarding the palliative care consult that was placed so he could follow up to see when they were planning on making contact. He said he would figure out how to get the patient seen so that she didn't need to go to the hospital.  At the conclusion of the call, I called patient's daughter, Kennyth Lose, the office contact for patient to see if she could advocate for the patient, or come to town to help with management of her mother. She stated that she lived 700 miles away and that her mother was mentally ill. During the discussion, it appears that the patient has a difficult relationship with her daughter regarding lifelong mental illness. The daughter states she's told her mother several times to arrange for help, but "she won't pay for anything. She has money in the bank, but she won't pay for anything that will help her". She agrees that patient needs help, and she states she's talked to her mom multiple times about this, but her mom refused to move forward. Kennyth Lose sounds very frustrated regarding this subject and says more than once "there is nothing I can do anymore". Kennyth Lose states she's talked to her mom a few times today and feels like the safest thing for her is to go to the hospital. I explained to her that EMS was reluctant to take her mom. Kennyth Lose strongly disagrees and states she will call EMS herself and request transfer to the ED. She doesn't plan on doing anything else  at this time.   Explained to Kennyth Lose that our hands were tied in a way because these issues all seem to revolve around her mental health and not her cancer diagnosis, but that we would help as we could. She understood. This office will put a referral to social work to see if there is anything available to patient that this office may not be aware of.   Dr Marin Olp aware of above. Will follow up as needed.

## 2017-07-18 ENCOUNTER — Other Ambulatory Visit: Payer: Self-pay | Admitting: Hematology & Oncology

## 2017-07-20 ENCOUNTER — Other Ambulatory Visit: Payer: Self-pay | Admitting: *Deleted

## 2017-07-20 ENCOUNTER — Telehealth: Payer: Self-pay | Admitting: *Deleted

## 2017-07-20 ENCOUNTER — Ambulatory Visit (HOSPITAL_COMMUNITY)
Admission: RE | Admit: 2017-07-20 | Discharge: 2017-07-20 | Disposition: A | Discharge status: Home / Self Care | Payer: Medicare Other | Source: Ambulatory Visit | Attending: Family | Admitting: Family

## 2017-07-20 DIAGNOSIS — I700 Atherosclerosis of aorta: Secondary | ICD-10-CM | POA: Insufficient documentation

## 2017-07-20 DIAGNOSIS — C7951 Secondary malignant neoplasm of bone: Secondary | ICD-10-CM | POA: Insufficient documentation

## 2017-07-20 DIAGNOSIS — M545 Low back pain: Secondary | ICD-10-CM | POA: Diagnosis not present

## 2017-07-20 DIAGNOSIS — C3492 Malignant neoplasm of unspecified part of left bronchus or lung: Secondary | ICD-10-CM | POA: Insufficient documentation

## 2017-07-20 DIAGNOSIS — I481 Persistent atrial fibrillation: Secondary | ICD-10-CM | POA: Diagnosis not present

## 2017-07-20 DIAGNOSIS — S0990XA Unspecified injury of head, initial encounter: Secondary | ICD-10-CM | POA: Diagnosis not present

## 2017-07-20 DIAGNOSIS — C3431 Malignant neoplasm of lower lobe, right bronchus or lung: Secondary | ICD-10-CM | POA: Diagnosis not present

## 2017-07-20 DIAGNOSIS — I480 Paroxysmal atrial fibrillation: Secondary | ICD-10-CM | POA: Diagnosis not present

## 2017-07-20 DIAGNOSIS — S199XXA Unspecified injury of neck, initial encounter: Secondary | ICD-10-CM | POA: Diagnosis not present

## 2017-07-20 DIAGNOSIS — M549 Dorsalgia, unspecified: Secondary | ICD-10-CM | POA: Diagnosis not present

## 2017-07-20 DIAGNOSIS — R102 Pelvic and perineal pain: Secondary | ICD-10-CM | POA: Diagnosis not present

## 2017-07-20 DIAGNOSIS — S40022A Contusion of left upper arm, initial encounter: Secondary | ICD-10-CM | POA: Diagnosis not present

## 2017-07-20 DIAGNOSIS — J9611 Chronic respiratory failure with hypoxia: Secondary | ICD-10-CM | POA: Diagnosis not present

## 2017-07-20 DIAGNOSIS — C787 Secondary malignant neoplasm of liver and intrahepatic bile duct: Secondary | ICD-10-CM | POA: Insufficient documentation

## 2017-07-20 DIAGNOSIS — I21A1 Myocardial infarction type 2: Secondary | ICD-10-CM | POA: Diagnosis not present

## 2017-07-20 DIAGNOSIS — C3490 Malignant neoplasm of unspecified part of unspecified bronchus or lung: Secondary | ICD-10-CM

## 2017-07-20 DIAGNOSIS — K5900 Constipation, unspecified: Secondary | ICD-10-CM | POA: Diagnosis not present

## 2017-07-20 LAB — GLUCOSE, CAPILLARY: Glucose-Capillary: 101 mg/dL — ABNORMAL HIGH (ref 65–99)

## 2017-07-20 MED ORDER — ALPRAZOLAM 0.5 MG PO TABS: 0.2500 mg | ORAL_TABLET | Freq: Two times a day (BID) | ORAL | 1 refills | Status: DC | PRN

## 2017-07-20 MED ORDER — FLUDEOXYGLUCOSE F - 18 (FDG) INJECTION
7.5200 | Freq: Once | INTRAVENOUS | Status: AC | PRN
Start: 2017-07-20 — End: 2017-07-20
  Administered 2017-07-20: 7.52 via INTRAVENOUS

## 2017-07-20 NOTE — Telephone Encounter (Signed)
Patient's daughter contacting the office with questions about the patient's lactulose. She states her, nor the patient, are sure of the dosing instruction. She is still having constipation.  Reviewed instructions with daughter. She is to take 50mls every 4 hours. If she is severely constipated, she can increase to 50mls every 4 hours. [name] understands and will help patient manage her medication.

## 2017-07-21 ENCOUNTER — Other Ambulatory Visit: Payer: Self-pay

## 2017-07-21 ENCOUNTER — Telehealth: Payer: Self-pay | Admitting: Medical

## 2017-07-21 ENCOUNTER — Other Ambulatory Visit: Payer: Self-pay | Admitting: Family

## 2017-07-21 ENCOUNTER — Emergency Department (HOSPITAL_COMMUNITY): Payer: Medicare Other

## 2017-07-21 ENCOUNTER — Ambulatory Visit: Payer: Medicare Other | Admitting: Podiatry

## 2017-07-21 ENCOUNTER — Inpatient Hospital Stay (HOSPITAL_COMMUNITY)
Admission: EM | Admit: 2017-07-21 | Discharge: 2017-07-29 | DRG: 281 | Disposition: A | Discharge status: Skilled Nursing Facility | Payer: Medicare Other | Attending: Family Medicine | Admitting: Family Medicine

## 2017-07-21 ENCOUNTER — Telehealth: Payer: Self-pay | Admitting: *Deleted

## 2017-07-21 DIAGNOSIS — Y92003 Bedroom of unspecified non-institutional (private) residence as the place of occurrence of the external cause: Secondary | ICD-10-CM

## 2017-07-21 DIAGNOSIS — S40022A Contusion of left upper arm, initial encounter: Secondary | ICD-10-CM | POA: Diagnosis present

## 2017-07-21 DIAGNOSIS — C3490 Malignant neoplasm of unspecified part of unspecified bronchus or lung: Secondary | ICD-10-CM | POA: Diagnosis present

## 2017-07-21 DIAGNOSIS — S0990XA Unspecified injury of head, initial encounter: Secondary | ICD-10-CM | POA: Diagnosis not present

## 2017-07-21 DIAGNOSIS — C7951 Secondary malignant neoplasm of bone: Principal | ICD-10-CM

## 2017-07-21 DIAGNOSIS — D696 Thrombocytopenia, unspecified: Secondary | ICD-10-CM | POA: Diagnosis present

## 2017-07-21 DIAGNOSIS — F329 Major depressive disorder, single episode, unspecified: Secondary | ICD-10-CM | POA: Diagnosis present

## 2017-07-21 DIAGNOSIS — F419 Anxiety disorder, unspecified: Secondary | ICD-10-CM

## 2017-07-21 DIAGNOSIS — Z818 Family history of other mental and behavioral disorders: Secondary | ICD-10-CM

## 2017-07-21 DIAGNOSIS — R52 Pain, unspecified: Secondary | ICD-10-CM | POA: Diagnosis not present

## 2017-07-21 DIAGNOSIS — I4891 Unspecified atrial fibrillation: Secondary | ICD-10-CM

## 2017-07-21 DIAGNOSIS — M4856XA Collapsed vertebra, not elsewhere classified, lumbar region, initial encounter for fracture: Secondary | ICD-10-CM | POA: Diagnosis present

## 2017-07-21 DIAGNOSIS — I21A1 Myocardial infarction type 2: Secondary | ICD-10-CM | POA: Diagnosis not present

## 2017-07-21 DIAGNOSIS — Z825 Family history of asthma and other chronic lower respiratory diseases: Secondary | ICD-10-CM

## 2017-07-21 DIAGNOSIS — M549 Dorsalgia, unspecified: Secondary | ICD-10-CM | POA: Diagnosis not present

## 2017-07-21 DIAGNOSIS — Z66 Do not resuscitate: Secondary | ICD-10-CM | POA: Diagnosis present

## 2017-07-21 DIAGNOSIS — Z803 Family history of malignant neoplasm of breast: Secondary | ICD-10-CM

## 2017-07-21 DIAGNOSIS — S32010A Wedge compression fracture of first lumbar vertebra, initial encounter for closed fracture: Secondary | ICD-10-CM | POA: Diagnosis present

## 2017-07-21 DIAGNOSIS — Z87891 Personal history of nicotine dependence: Secondary | ICD-10-CM

## 2017-07-21 DIAGNOSIS — D649 Anemia, unspecified: Secondary | ICD-10-CM | POA: Diagnosis present

## 2017-07-21 DIAGNOSIS — K5641 Fecal impaction: Secondary | ICD-10-CM | POA: Diagnosis present

## 2017-07-21 DIAGNOSIS — K567 Ileus, unspecified: Secondary | ICD-10-CM

## 2017-07-21 DIAGNOSIS — M545 Low back pain: Secondary | ICD-10-CM | POA: Diagnosis not present

## 2017-07-21 DIAGNOSIS — Z515 Encounter for palliative care: Secondary | ICD-10-CM

## 2017-07-21 DIAGNOSIS — K5900 Constipation, unspecified: Secondary | ICD-10-CM | POA: Diagnosis not present

## 2017-07-21 DIAGNOSIS — I10 Essential (primary) hypertension: Secondary | ICD-10-CM | POA: Diagnosis present

## 2017-07-21 DIAGNOSIS — J9611 Chronic respiratory failure with hypoxia: Secondary | ICD-10-CM | POA: Diagnosis present

## 2017-07-21 DIAGNOSIS — S199XXA Unspecified injury of neck, initial encounter: Secondary | ICD-10-CM | POA: Diagnosis not present

## 2017-07-21 DIAGNOSIS — I480 Paroxysmal atrial fibrillation: Secondary | ICD-10-CM | POA: Diagnosis not present

## 2017-07-21 DIAGNOSIS — I951 Orthostatic hypotension: Secondary | ICD-10-CM | POA: Diagnosis present

## 2017-07-21 DIAGNOSIS — G894 Chronic pain syndrome: Secondary | ICD-10-CM | POA: Diagnosis present

## 2017-07-21 DIAGNOSIS — Z7901 Long term (current) use of anticoagulants: Secondary | ICD-10-CM

## 2017-07-21 DIAGNOSIS — E876 Hypokalemia: Secondary | ICD-10-CM | POA: Diagnosis present

## 2017-07-21 DIAGNOSIS — E039 Hypothyroidism, unspecified: Secondary | ICD-10-CM | POA: Diagnosis present

## 2017-07-21 DIAGNOSIS — C787 Secondary malignant neoplasm of liver and intrahepatic bile duct: Secondary | ICD-10-CM | POA: Diagnosis present

## 2017-07-21 DIAGNOSIS — Z7189 Other specified counseling: Secondary | ICD-10-CM

## 2017-07-21 DIAGNOSIS — R102 Pelvic and perineal pain: Secondary | ICD-10-CM | POA: Diagnosis not present

## 2017-07-21 DIAGNOSIS — Z87440 Personal history of urinary (tract) infections: Secondary | ICD-10-CM

## 2017-07-21 DIAGNOSIS — I272 Pulmonary hypertension, unspecified: Secondary | ICD-10-CM | POA: Diagnosis present

## 2017-07-21 DIAGNOSIS — W19XXXA Unspecified fall, initial encounter: Secondary | ICD-10-CM

## 2017-07-21 DIAGNOSIS — Z85820 Personal history of malignant melanoma of skin: Secondary | ICD-10-CM

## 2017-07-21 DIAGNOSIS — Z9049 Acquired absence of other specified parts of digestive tract: Secondary | ICD-10-CM

## 2017-07-21 DIAGNOSIS — Z811 Family history of alcohol abuse and dependence: Secondary | ICD-10-CM

## 2017-07-21 DIAGNOSIS — Z808 Family history of malignant neoplasm of other organs or systems: Secondary | ICD-10-CM

## 2017-07-21 DIAGNOSIS — K439 Ventral hernia without obstruction or gangrene: Secondary | ICD-10-CM | POA: Diagnosis present

## 2017-07-21 DIAGNOSIS — I081 Rheumatic disorders of both mitral and tricuspid valves: Secondary | ICD-10-CM | POA: Diagnosis present

## 2017-07-21 DIAGNOSIS — I481 Persistent atrial fibrillation: Principal | ICD-10-CM | POA: Diagnosis present

## 2017-07-21 DIAGNOSIS — W06XXXA Fall from bed, initial encounter: Secondary | ICD-10-CM | POA: Diagnosis present

## 2017-07-21 DIAGNOSIS — R339 Retention of urine, unspecified: Secondary | ICD-10-CM | POA: Diagnosis not present

## 2017-07-21 DIAGNOSIS — C3492 Malignant neoplasm of unspecified part of left bronchus or lung: Secondary | ICD-10-CM | POA: Diagnosis present

## 2017-07-21 DIAGNOSIS — E785 Hyperlipidemia, unspecified: Secondary | ICD-10-CM | POA: Diagnosis present

## 2017-07-21 DIAGNOSIS — F411 Generalized anxiety disorder: Secondary | ICD-10-CM | POA: Diagnosis present

## 2017-07-21 DIAGNOSIS — J449 Chronic obstructive pulmonary disease, unspecified: Secondary | ICD-10-CM | POA: Diagnosis present

## 2017-07-21 LAB — CBC WITH DIFFERENTIAL/PLATELET
Basophils Absolute: 0 10*3/uL (ref 0.0–0.1)
Basophils Relative: 0 %
EOS PCT: 0 %
Eosinophils Absolute: 0 10*3/uL (ref 0.0–0.7)
HEMATOCRIT: 29.2 % — AB (ref 36.0–46.0)
HEMOGLOBIN: 9.4 g/dL — AB (ref 12.0–15.0)
LYMPHS ABS: 1.1 10*3/uL (ref 0.7–4.0)
LYMPHS PCT: 18 %
MCH: 31.4 pg (ref 26.0–34.0)
MCHC: 32.2 g/dL (ref 30.0–36.0)
MCV: 97.7 fL (ref 78.0–100.0)
Monocytes Absolute: 0.7 10*3/uL (ref 0.1–1.0)
Monocytes Relative: 12 %
NEUTROS ABS: 4.4 10*3/uL (ref 1.7–7.7)
Neutrophils Relative %: 70 %
PLATELETS: 109 10*3/uL — AB (ref 150–400)
RBC: 2.99 MIL/uL — AB (ref 3.87–5.11)
RDW: 18.3 % — ABNORMAL HIGH (ref 11.5–15.5)
WBC: 6.3 10*3/uL (ref 4.0–10.5)

## 2017-07-21 LAB — COMPREHENSIVE METABOLIC PANEL
ALK PHOS: 72 U/L (ref 38–126)
ALT: 17 U/L (ref 14–54)
AST: 24 U/L (ref 15–41)
Albumin: 2.9 g/dL — ABNORMAL LOW (ref 3.5–5.0)
Anion gap: 12 (ref 5–15)
BILIRUBIN TOTAL: 0.8 mg/dL (ref 0.3–1.2)
BUN: 15 mg/dL (ref 6–20)
CALCIUM: 8.8 mg/dL — AB (ref 8.9–10.3)
CHLORIDE: 101 mmol/L (ref 101–111)
CO2: 24 mmol/L (ref 22–32)
CREATININE: 0.85 mg/dL (ref 0.44–1.00)
GFR calc Af Amer: >60 mL/min (ref >60)
GFR, EST NON AFRICAN AMERICAN: 60 mL/min — AB (ref >60)
Glucose, Bld: 83 mg/dL (ref 65–99)
Potassium: 3.9 mmol/L (ref 3.5–5.1)
Sodium: 137 mmol/L (ref 135–145)
Total Protein: 5.6 g/dL — ABNORMAL LOW (ref 6.5–8.1)

## 2017-07-21 LAB — I-STAT TROPONIN, ED: Troponin i, poc: 0.03 ng/mL (ref 0.00–0.08)

## 2017-07-21 LAB — LIPASE, BLOOD: LIPASE: 42 U/L (ref 11–51)

## 2017-07-21 LAB — CK: CK TOTAL: 36 U/L — AB (ref 38–234)

## 2017-07-21 MED ORDER — SODIUM CHLORIDE 0.9 % IV BOLUS
500.0000 mL | Freq: Once | INTRAVENOUS | Status: AC
  Administered 2017-07-21: 500 mL via INTRAVENOUS

## 2017-07-21 MED ORDER — MILK AND MOLASSES ENEMA
1.0000 | Freq: Once | RECTAL | Status: AC
  Administered 2017-07-21: 250 mL via RECTAL
  Filled 2017-07-21: qty 250

## 2017-07-21 NOTE — ED Provider Notes (Signed)
[facility] EMERGENCY DEPARTMENT Provider Note   CSN: [id number] Arrival date & time: 07/21/17  1814     History   Chief Complaint Chief Complaint  Patient presents with  . Fall  . Back Pain  . Constipation    HPI [name] is a 84 y.o. female.  HPI [name] is a 84 y.o. female with stage IV lung cancer, A. fib, COPD, Parkinson's disease, presents to emergency department with complaint of constipation.  Patient has had constipation for several weeks.  She is currently undergoing chemotherapy.  She has tried Metamucil and MiraLAX with no relief of her symptoms.  She was seen by her family doctor was concerned about possible small bowel obstruction, she had blood work and x-ray done in the office.  X-ray did not show any evidence of small bowel obstruction but it showed constipation and fecal impaction.  Patient states that she is now more comfortable.  She also reports falling last night, but states it is unrelated.  She states that she fell sometimes in her sleep off of her bed.  She states she was unable to get up and  finish sleeping on the floor until morning time when she called her family member who called EMS.  Patient states EMS helped off the floor but she did not want to come to the hospital at that time.  She denies any known injuries from the fall.  She states she is having pain to the left arm and lower back. Last chemo 3 wks ago  Past Medical History:  Diagnosis Date  . Adenocarcinoma of left lung metastatic to liver ([place]) 04/24/2017  . Anxiety   . Arthritis   . Atrial fibrillation ([place])   . Cardiac arrhythmia due to congenital heart disease   . Chronic constipation   . COPD (chronic obstructive pulmonary disease) ([place])   . Depression   . GERD (gastroesophageal reflux disease)   . Hemorrhoid   . Hyperlipidemia   . Idiopathic hypotension   . Large cell carcinoma of left lung, stage 4 ([place]) 04/24/2017  . Lightheadedness 11/2015  .  Lung cancer metastatic to bone ([place]) 04/24/2017  . Mass of lower lobe of right lung 03/25/2017  . Melanoma of skin ([place]) 12/06/2016  . Mycotic toenails 10/27/2012  . Parkinson disease ([place])   . Recurrent UTI   . 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 Varicose veins     Patient Active Problem List   Diagnosis Date Noted  . Large cell carcinoma of left lung, stage 4 ([place]) 04/24/2017  . Adenocarcinoma of left lung metastatic to liver ([place]) 04/24/2017  . Lung cancer metastatic to bone ([place]) 04/24/2017  . Mass of lower lobe of right lung 03/25/2017  . Melanoma of skin ([place]) 12/06/2016  . Hypokalemia 08/25/2016  . Failure to thrive in adult 04/01/2016  . Blurry vision, right eye   . PCP NOTES >>>>>>>>>>>>>>>>>>>>>>> 03/02/2015  . COPD, group B, by GOLD 2017 classification ([place]) 01/31/2015  . Other fatigue 06/21/2014  . Lightheadedness 06/12/2014  . COPD exacerbation ([place]) 09/26/2013  . IBS (irritable bowel syndrome) 04/02/2012  . Counseling regarding goals of care 11/25/2011  . Anxiety and depression 07/10/2011  . Parkinson's disease ([place]) 02/03/2011  . Recurrent UTI 11/01/2010  . GERD (gastroesophageal reflux disease) 11/01/2010  . Memory loss 11/01/2010  . Hip pain 09/23/2010  . COMPRESSION FRACTURE, LUMBAR VERTEBRAE 03/28/2010  . Atrial fibrillation ([place]) 03/22/2010  . DIZZINESS 03/22/2010  . Mycotic toenails 10/02/2009  .  DYSPNEA ON EXERTION 01/19/2009  . HEMORRHOIDS-INTERNAL 02/14/2008  . Constipation 02/14/2008  . PERSONAL HX COLONIC POLYPS 02/14/2008    Past Surgical History:  Procedure Laterality Date  . APPENDECTOMY  84 years old  . COLON RESECTION  2008  . melanoma removal Left    left cheek  . vitriectomy  08-2015     OB History   None      Home Medications    Prior to Admission medications   Medication Sig Start Date End Date Taking? Authorizing Provider  acetaminophen (TYLENOL) 325 MG tablet Take 2 tablets (650 mg total) by mouth every 6 (six) hours as  needed for mild pain (or Fever >/= 101). 08/29/16   Patrecia Pour, MD  albuterol (VENTOLIN HFA) 108 (90 BASE) MCG/ACT inhaler Inhale 2 puffs into the lungs 2 (two) times daily as needed for wheezing or shortness of breath. 02/05/15   Midge Minium, MD  ALPRAZolam Duanne Moron) 0.5 MG tablet TAKE 1/2 TO 1 TABLET BY MOUTH 2 TIMES A DAY AS NEEDED FOR ANXIETY 07/20/17   Volanda Napoleon, MD  ALPRAZolam Duanne Moron) 0.5 MG tablet Take 0.5-1 tablets (0.25-0.5 mg total) by mouth 2 (two) times daily as needed for anxiety. 07/20/17   Volanda Napoleon, MD  apixaban (ELIQUIS) 5 MG TABS tablet Take 5 mg by mouth 2 (two) times daily.    [provider]  Ascorbic Acid (VITAMIN C) 1000 MG tablet Take 1,000 mg by mouth daily.    [provider]  azelastine (ASTELIN) 0.1 % nasal spray Place 2 sprays into both nostrils at bedtime as needed for rhinitis. Use in each nostril as directed 07/14/16   Saguier, Percell Miller, PA-C  Cholecalciferol (VITAMIN D) 400 UNIT/ML LIQD Take 400 Units by mouth daily.     [provider]  dexamethasone (DECADRON) 4 MG tablet Take 1 tab two times a day the day before Alimta chemo. Take 2 tabs two times a day starting the day after chemo for 3 days. 06/15/17   Volanda Napoleon, MD  diltiazem (CARDIZEM) 30 MG tablet Take 1 tablet (30 mg total) by mouth 4 (four) times daily. Patient taking differently: Take 30 mg by mouth 3 (three) times daily.  05/19/17   Saguier, Percell Miller, PA-C  DULoxetine (CYMBALTA) 30 MG capsule TAKE 1 CAPSULE DAILY BY MOUTH. 06/08/17   Saguier, Percell Miller, PA-C  folic acid (FOLVITE) 1 MG tablet Take 1 tablet (1 mg total) by mouth daily. Take daily starting 5-7 days before Alimta chemo. Continue until 21 days after Alimta completed. 04/28/17   Volanda Napoleon, MD  lactulose (CHRONULAC) 10 GM/15ML solution Take 15 mLs (10 g total) by mouth every 4 (four) hours as needed for mild constipation. 06/02/17   Volanda Napoleon, MD  ondansetron (ZOFRAN) 8 MG tablet Take 1 tablet (8  mg total) by mouth every 8 (eight) hours as needed for nausea or vomiting. 04/07/17   Volanda Napoleon, MD  polyethylene glycol powder (GLYCOLAX/MIRALAX) powder Take 17 g by mouth 2 (two) times daily. Patient taking differently: Take 17 g by mouth 2 (two) times daily as needed (for constipation.).  05/28/16   Waynetta Pean, PA-C  prochlorperazine (COMPAZINE) 10 MG tablet Take 1 tablet (10 mg total) by mouth every 6 (six) hours as needed for nausea or vomiting. 04/16/17   Volanda Napoleon, MD  Propylene Glycol (SYSTANE BALANCE OP) Apply 2 drops to eye 3 (three) times daily as needed (for dry/irritated eyes).     [provider]    Family History Family History  Problem Relation Age of Onset  . Asthma Brother   . Alcohol abuse Brother   . Throat cancer Father   . Alcohol abuse Father   . Lupus Daughter   . Bipolar disorder Daughter   . Lupus Daughter   . Anxiety disorder Daughter   . Alcohol abuse Sister   . Alcohol abuse Maternal Grandfather   . Alcohol abuse Paternal Grandmother   . Breast cancer Sister   . Colon cancer Neg Hx     Social History Social History   Tobacco Use  . Smoking status: Former Smoker    Packs/day: 2.00    Years: 30.00    Pack years: 60.00    Types: Cigarettes    Last attempt to quit: 04/21/1980    Years since quitting: 37.2  . Smokeless tobacco: Never Used  . Tobacco comment: onset age 4 -24, up to > 1ppd (almost 2 ppd)  Substance Use Topics  . Alcohol use: No    Alcohol/week: 0.0 oz  . Drug use: No     Allergies   Adhesive [tape]; Amoxicillin; Ciprofloxacin; and Ultram [tramadol]   Review of Systems Review of Systems  Constitutional: Positive for fatigue. Negative for chills and fever.  Respiratory: Negative for cough, chest tightness and shortness of breath.   Cardiovascular: Negative for chest pain, palpitations and leg swelling.  Gastrointestinal: Positive for abdominal pain and constipation. Negative for blood in stool,  diarrhea, nausea and vomiting.  Genitourinary: Negative for dysuria, flank pain and pelvic pain.  Musculoskeletal: Positive for arthralgias, back pain and myalgias. Negative for neck pain and neck stiffness.  Skin: Negative for rash.  Neurological: Positive for weakness. Negative for dizziness and headaches.  All other systems reviewed and are negative.    Physical Exam Updated Vital Signs BP (!) 127/96 (BP Location: Right Arm)   Pulse (!) 113   Temp 98 F (36.7 C) (Oral)   Resp 19   Ht 5\' 10"  (1.778 m)   Wt 71.7 kg (158 lb)   SpO2 95%   BMI 22.67 kg/m   Physical Exam  Constitutional: She appears well-developed and well-nourished. No distress.  HENT:  Head: Normocephalic.  Eyes: Conjunctivae are normal.  Neck: Neck supple.  Cardiovascular: Normal rate, regular rhythm and normal heart sounds.  Pulmonary/Chest: Effort normal and breath sounds normal. No respiratory distress. She has no wheezes. She has no rales.  Abdominal: Soft. Bowel sounds are normal. She exhibits distension. There is tenderness. There is no rebound and no guarding.  Diffuse tenderness with no guarding.  Bowel sounds present.  Genitourinary:  Genitourinary Comments: Fecal impaction.  External hemorrhoids present  Musculoskeletal: She exhibits no edema.  Contusion to the left upper arm, full range of motion bilateral shoulders, elbows, wrists.  Full range of motion bilateral hips, knees.  Neurological: She is alert.  Skin: Skin is warm and dry.  Psychiatric: She has a normal mood and affect. Her behavior is normal.  Nursing note and vitals reviewed.    ED Treatments / Results  Labs (all labs ordered are listed, but only abnormal results are displayed) Labs Reviewed  CBC WITH DIFFERENTIAL/PLATELET - Abnormal; Notable for the following components:      Result Value   RBC 2.99 (*)    Hemoglobin 9.4 (*)    HCT 29.2 (*)    RDW 18.3 (*)    All other components within normal limits  COMPREHENSIVE  METABOLIC PANEL  LIPASE, BLOOD  URINALYSIS, ROUTINE W REFLEX MICROSCOPIC  CK  I-STAT TROPONIN, ED    EKG None  Radiology Dg Lumbar Spine Complete  Result Date: 07/21/2017 CLINICAL DATA:  Low back pain EXAM: LUMBAR SPINE - COMPLETE 4+ VIEW COMPARISON:  05/28/2016 FINDINGS: Five lumbar type vertebral bodies are well visualized. Mild scoliosis concave to the right is noted. L1 compression deformity is noted stable from the prior CT examination. Multilevel disc space narrowing and osteophytic changes are seen. The overlying soft tissues demonstrate changes of constipation. These are similar to that seen on recent PET-CT. IMPRESSION: Stable L1 compression deformity. Multilevel degenerative change without acute abnormality. Electronically Signed   By: Inez Catalina M.D.   On: 07/21/2017 19:40   Ct Head Wo Contrast  Result Date: 07/21/2017 CLINICAL DATA:  The patient fell off a bed today. Weakness. Initial encounter. EXAM: CT HEAD WITHOUT CONTRAST CT CERVICAL SPINE WITHOUT CONTRAST TECHNIQUE: Multidetector CT imaging of the head and cervical spine was performed following the standard protocol without intravenous contrast. Multiplanar CT image reconstructions of the cervical spine were also generated. COMPARISON:  Head CT scan 03/16/2017.  Brain MRI 04/16/2017. FINDINGS: CT HEAD FINDINGS Brain: No evidence of acute infarction, hemorrhage, hydrocephalus, extra-axial collection or mass lesion/mass effect. Cortical atrophy is identified. Extensive hypoattenuation in the subcortical and periventricular deep white matter consistent chronic microvascular ischemic change is also seen. Vascular: No hyperdense vessel or unexpected calcification. Skull: Intact.  No focal lesion. Sinuses/Orbits: No acute abnormality.  Status post lens extraction. Other: None. CT CERVICAL SPINE FINDINGS Alignment: 0.2 cm facet mediated anterolisthesis C3 on C4 is identified. Otherwise maintained. Skull base and vertebrae: No acute  fracture. No primary bone lesion or focal pathologic process. Soft tissues and spinal canal: No prevertebral fluid or swelling. No visible canal hematoma. Disc levels: Marked loss of disc space height is seen from C3 to C7. Advanced multilevel facet arthropathy is also noted. The facet joints are ankylosed bilaterally at C2-3 and C3-4. Upper chest: Clear. Other: None. IMPRESSION: No acute abnormality head or cervical spine. Atrophy and extensive chronic microvascular ischemic change. Cervical spondylosis. Electronically Signed   By: Inge Rise M.D.   On: 07/21/2017 19:14   Ct Cervical Spine Wo Contrast  Result Date: 07/21/2017 CLINICAL DATA:  The patient fell off a bed today. Weakness. Initial encounter. EXAM: CT HEAD WITHOUT CONTRAST CT CERVICAL SPINE WITHOUT CONTRAST TECHNIQUE: Multidetector CT imaging of the head and cervical spine was performed following the standard protocol without intravenous contrast. Multiplanar CT image reconstructions of the cervical spine were also generated. COMPARISON:  Head CT scan 03/16/2017.  Brain MRI 04/16/2017. FINDINGS: CT HEAD FINDINGS Brain: No evidence of acute infarction, hemorrhage, hydrocephalus, extra-axial collection or mass lesion/mass effect. Cortical atrophy is identified. Extensive hypoattenuation in the subcortical and periventricular deep white matter consistent chronic microvascular ischemic change is also seen. Vascular: No hyperdense vessel or unexpected calcification. Skull: Intact.  No focal lesion. Sinuses/Orbits: No acute abnormality.  Status post lens extraction. Other: None. CT CERVICAL SPINE FINDINGS Alignment: 0.2 cm facet mediated anterolisthesis C3 on C4 is identified. Otherwise maintained. Skull base and vertebrae: No acute fracture. No primary bone lesion or focal pathologic process. Soft tissues and spinal canal: No prevertebral fluid or swelling. No visible canal hematoma. Disc levels: Marked loss of disc space height is seen from C3 to  C7. Advanced multilevel facet arthropathy is also noted. The facet joints are ankylosed bilaterally at C2-3 and C3-4. Upper chest: Clear. Other: None. IMPRESSION: No acute abnormality head or cervical  spine. Atrophy and extensive chronic microvascular ischemic change. Cervical spondylosis. Electronically Signed   By: Inge Rise M.D.   On: 07/21/2017 19:14   Nm Pet Image Restag (ps) Skull Base To Thigh  Result Date: 07/20/2017 CLINICAL DATA:  Subsequent treatment strategy for lung cancer. EXAM: NUCLEAR MEDICINE PET SKULL BASE TO THIGH TECHNIQUE: 7.5 mCi F-18 FDG was injected intravenously. Full-ring PET imaging was performed from the skull base to thigh after the radiotracer. CT data was obtained and used for attenuation correction and anatomic localization. Fasting blood glucose: 101 mg/dl COMPARISON:  06/11/2017. FINDINGS: Mediastinal blood pool activity: SUV max 1.7 NECK: No hypermetabolic lymph nodes. Incidental CT findings: None. CHEST: No hypermetabolic mediastinal, hilar or axillary lymph nodes. No hypermetabolic pulmonary nodules. Specifically, a 0.9 x 1.4 cm nodule in the medial right lower lobe does not show metabolism above blood pool. Incidental CT findings: Atherosclerotic calcification of the arterial vasculature, including coronary arteries. Heart is enlarged. No pericardial effusion. There may be trace right pleural fluid. ABDOMEN/PELVIS: No abnormal hypermetabolism in the liver, adrenal glands, spleen or pancreas. No hypermetabolic lymph nodes. Incidental CT findings: None. SKELETON: No abnormal osseous hypermetabolism. Incidental CT findings: Degenerative changes in the spine and hips. IMPRESSION: 1. No residual abnormal hypermetabolism in the neck, chest, abdomen or pelvis. Residual nodule in the medial right lower lobe does not show hypermetabolism above blood pool. 2. Probable trace right pleural fluid. 3.  Aortic atherosclerosis (ICD10-170.0). Electronically Signed   By: Lorin Picket  M.D.   On: 07/20/2017 14:06   Dg Abd Acute W/chest  Result Date: 07/21/2017 CLINICAL DATA:  Abdominal pain and distension EXAM: DG ABDOMEN ACUTE W/ 1V CHEST COMPARISON:  07/13/2017 FINDINGS: Cardiac shadow is mildly enlarged. Aortic calcifications are noted. Mild left basilar atelectasis is seen. No focal infiltrate is noted. Scattered large and small bowel gas is seen. No free air is noted. Retained fecal material is noted consistent with a degree of constipation. No definitive obstructive changes are seen. IMPRESSION: Changes of constipation without obstruction. No other focal abnormality is noted. Electronically Signed   By: Inez Catalina M.D.   On: 07/21/2017 19:38   Dg Hips Bilat W Or Wo Pelvis 3-4 Views  Result Date: 07/21/2017 CLINICAL DATA:  Pelvic pain EXAM: DG HIP (WITH OR WITHOUT PELVIS) 4V BILAT COMPARISON:  None. FINDINGS: Pelvic ring is intact. Degenerative changes of the hip joints are noted bilaterally. No acute fracture or dislocation is seen. No gross soft tissue abnormality is noted. Degenerative changes of the pubic symphysis are seen. IMPRESSION: Degenerative change without acute abnormality. Electronically Signed   By: Inez Catalina M.D.   On: 07/21/2017 19:42    Procedures Procedures (including critical care time)  Medications Ordered in ED Medications  milk and molasses enema (has no administration in time range)     Initial Impression / Assessment and Plan / ED Course  I have reviewed the triage vital signs and the nursing notes.  Pertinent labs & imaging results that were available during my care of the patient were reviewed by me and considered in my medical decision making (see chart for details).     With fecal impaction, initially avoided rectal exam because patient is on chemotherapy.  Will check labs, x-rays due to fall, abdominal x-ray.   8:49 PM Abdominal x-ray concerning for constipation with no signs of obstruction.  Her white blood cell count is normal,  she is not neutropenic.  Rectal exam performed with soft fecal impaction.  She is unable  to tolerate disimpaction however.  Will try enema.  12:08 AM Patient received an enema, she had a small bowel movement.  She is now crying, appears very anxious, she is afraid of going home, she continues to have some abdominal discomfort.  Given history of cancer, slightly low hemoglobin, continued abdominal pain, will get CT abdomen and pelvis.  She is on Xanax and I do not think she had a dose this evening, will give her a dose of Ativan and give IV fluids.  I will  also give her evening dose of her Cardizem.  Her heart rate is borderline high.   Signed out to PA [place] at shift change. If CT negative, I think pt appropriate to be dc home with magnesium citrate to further help with her constipation and if HR improves.       Final Clinical Impressions(s) / ED Diagnoses   Final diagnoses:  None    ED Discharge Orders    None       Jeannett Senior, PA-C 47/15/95 [id number]    Delora Fuel, MD 28/97/91 [phone]

## 2017-07-21 NOTE — Telephone Encounter (Signed)
Received call from Catherine Balloon NP, the NP with Palliative Care. She had just finished a home visit with the patient. A social worker from Palliative was also present.   [name] feels that the patient is unsafe to be at home. She states the patient is weak, unable to answer questions and seems significantly confused. She states the patient has no idea which medications she is taking, doses or frequencies. She is not sure whether the patient is eating. The neighbor told [name] that the patient has spent a few nights on the floor because she falls and then is unable to stand back up until morning. [name] states that patient doesn't qualify for inpatient admission, but that in her opinion, 24h care is necessary and feels like a facility would work best.   Per [name]'s request, this office will place orders for home health RN, and PT to try an get some help in the home. [name] states there are a few neighbors who have agreed to stay in the home at night until [name], the patient's daughter arrives on Saturday.   Relayed to [name] that this office has spoken to the daughters several times about the patient needing more help in the home. Also shared that we have, on multiple occasions requested the patient be brought to the ED for assessment based on her mental state. At this time, none of the recommendations have been followed. I requested that she reach out to the daughters to see if either one would advocate for their mother and help to arrange the care she needs.   [name] will also follow up with patient's PCP who placed the palliative care order. This office will place orders for home health RN and PT.

## 2017-07-21 NOTE — Telephone Encounter (Signed)
Patient's daughter notifying the office that patient fell over the night. EMS was called. They assessed patient and felt like there was no injury and didn't transport to the hospital.   Daughter also wanted to discuss patient's mental health and ability to stay home by herself. She feels like the patient needs more monitoring at home or possible transfer to a residential facility. She does state that palliative care will be out today to establish care. Daughter states that patient is extremely weak, and appears to have issues with what the daughter calls "dementia". She wants to know how much of this is related to chemo.  Reviewed with the daughter that while the chemo can cause weakness and 'fog', the severity of her symptoms are unlikely to all be related to chemo. From talking to the patient daily for the last week or so, the patient's mental state is what seems to be most problematic and her feelings of being overwhelmed and stressed could be manifesting in fatigue and confusion. Daughter agreed with this. With her and her sister living in other states, there is no local family to help and patient refuses to pay for aid.  Recommended that she and her sister speak with palliative once they complete their assessment and make a plan on how to help their mother. They may need to consider paid in home care or something residential until the patient's mental state can return to baseline. She understands, but seems to be reluctant to put in the work required to get her mom help.   Will follow and help as possible.

## 2017-07-21 NOTE — Telephone Encounter (Signed)
Copied from [place] [phone]. Topic: Quick Communication - See Telephone Encounter >> Jul 21, 2017  1:58 PM Robina Ade, Helene Kelp D wrote: Wynell Balloon from [facility] called asking a call back from [place] or his CMA about patients' condition. Her number is [phone]. Thanks.CRM for notification. See Telephone encounter for: 07/21/17.

## 2017-07-21 NOTE — Telephone Encounter (Signed)
Spoke [name] from Palliative. [name] states pt has called EMS 3 times since Friday. Pt is is not remembering  to eat or if she took her medication or not. [name] is really worried about pt. Tired to reach pt to schedule appointment for tomorrow pt did not answer the phone. Called pt's daughter states she will have pt to call to schedule appointment. Pt's daughter also states pt had a fall yesterday.

## 2017-07-21 NOTE — Telephone Encounter (Signed)
I would recommend she be evaluated in ED. Not sure if she called EMS 3 times why they did not take her to ED. She is probably dehydrated. When she comes to see me I will likely recommend ED evaluation as I won't want to order out patient labs and wait 3-4 hours for results. If she meets criteria for admission in light of lung cancer history they can determine appropriate placement on discharge.

## 2017-07-21 NOTE — ED Notes (Signed)
ED Provider at bedside. 

## 2017-07-21 NOTE — Telephone Encounter (Addendum)
Patient is aware of results. Daughter [name] also aware of results  ----- Message from Volanda Napoleon, MD sent at 07/21/2017  6:53 AM EDT ----- Call - You are in remission!!!  NO active cancer seen on the PET scan!!!  This is fantastic!!! [name]

## 2017-07-21 NOTE — Telephone Encounter (Signed)
Pt lives alone and pt's daughter will not be here until Sat. Pt daughter states her mother my not be up for driving today.   Spoke with pt she will try to find someone to take her to ED

## 2017-07-21 NOTE — ED Triage Notes (Signed)
Patient fell this am approx 0730 and laid on floor for approx 90 minutes. Patient called 911 for lift assistance but no EMS called. Patient has large hematoma on left arm. Patient also c/o lower back pain and constipation as well. Patient brought in this evening by EMS.

## 2017-07-21 NOTE — ED Notes (Signed)
Patient out to radiology.

## 2017-07-22 ENCOUNTER — Emergency Department (HOSPITAL_COMMUNITY): Payer: Medicare Other

## 2017-07-22 ENCOUNTER — Other Ambulatory Visit: Payer: Self-pay

## 2017-07-22 ENCOUNTER — Observation Stay (HOSPITAL_BASED_OUTPATIENT_CLINIC_OR_DEPARTMENT_OTHER): Payer: Medicare Other

## 2017-07-22 ENCOUNTER — Telehealth: Payer: Self-pay | Admitting: *Deleted

## 2017-07-22 ENCOUNTER — Observation Stay (HOSPITAL_COMMUNITY): Payer: Medicare Other

## 2017-07-22 DIAGNOSIS — R7989 Other specified abnormal findings of blood chemistry: Secondary | ICD-10-CM | POA: Diagnosis not present

## 2017-07-22 DIAGNOSIS — S32010B Wedge compression fracture of first lumbar vertebra, initial encounter for open fracture: Secondary | ICD-10-CM | POA: Diagnosis not present

## 2017-07-22 DIAGNOSIS — S32010A Wedge compression fracture of first lumbar vertebra, initial encounter for closed fracture: Secondary | ICD-10-CM | POA: Diagnosis present

## 2017-07-22 DIAGNOSIS — G8911 Acute pain due to trauma: Secondary | ICD-10-CM | POA: Diagnosis not present

## 2017-07-22 DIAGNOSIS — M549 Dorsalgia, unspecified: Secondary | ICD-10-CM | POA: Diagnosis not present

## 2017-07-22 DIAGNOSIS — E785 Hyperlipidemia, unspecified: Secondary | ICD-10-CM | POA: Diagnosis not present

## 2017-07-22 DIAGNOSIS — S32010D Wedge compression fracture of first lumbar vertebra, subsequent encounter for fracture with routine healing: Secondary | ICD-10-CM | POA: Diagnosis not present

## 2017-07-22 DIAGNOSIS — Z811 Family history of alcohol abuse and dependence: Secondary | ICD-10-CM | POA: Diagnosis not present

## 2017-07-22 DIAGNOSIS — C3492 Malignant neoplasm of unspecified part of left bronchus or lung: Secondary | ICD-10-CM | POA: Diagnosis not present

## 2017-07-22 DIAGNOSIS — S40022A Contusion of left upper arm, initial encounter: Secondary | ICD-10-CM | POA: Diagnosis present

## 2017-07-22 DIAGNOSIS — E039 Hypothyroidism, unspecified: Secondary | ICD-10-CM | POA: Diagnosis not present

## 2017-07-22 DIAGNOSIS — C787 Secondary malignant neoplasm of liver and intrahepatic bile duct: Secondary | ICD-10-CM | POA: Diagnosis not present

## 2017-07-22 DIAGNOSIS — G20 Parkinson's disease: Secondary | ICD-10-CM | POA: Diagnosis not present

## 2017-07-22 DIAGNOSIS — M4856XA Collapsed vertebra, not elsewhere classified, lumbar region, initial encounter for fracture: Secondary | ICD-10-CM | POA: Diagnosis not present

## 2017-07-22 DIAGNOSIS — M6281 Muscle weakness (generalized): Secondary | ICD-10-CM | POA: Diagnosis not present

## 2017-07-22 DIAGNOSIS — R109 Unspecified abdominal pain: Secondary | ICD-10-CM | POA: Diagnosis not present

## 2017-07-22 DIAGNOSIS — I081 Rheumatic disorders of both mitral and tricuspid valves: Secondary | ICD-10-CM | POA: Diagnosis present

## 2017-07-22 DIAGNOSIS — G620 Drug-induced polyneuropathy: Secondary | ICD-10-CM | POA: Diagnosis not present

## 2017-07-22 DIAGNOSIS — Z7901 Long term (current) use of anticoagulants: Secondary | ICD-10-CM | POA: Diagnosis not present

## 2017-07-22 DIAGNOSIS — Z9221 Personal history of antineoplastic chemotherapy: Secondary | ICD-10-CM | POA: Diagnosis not present

## 2017-07-22 DIAGNOSIS — K5900 Constipation, unspecified: Secondary | ICD-10-CM

## 2017-07-22 DIAGNOSIS — C7951 Secondary malignant neoplasm of bone: Secondary | ICD-10-CM

## 2017-07-22 DIAGNOSIS — I272 Pulmonary hypertension, unspecified: Secondary | ICD-10-CM | POA: Diagnosis present

## 2017-07-22 DIAGNOSIS — C3490 Malignant neoplasm of unspecified part of unspecified bronchus or lung: Secondary | ICD-10-CM | POA: Diagnosis not present

## 2017-07-22 DIAGNOSIS — F329 Major depressive disorder, single episode, unspecified: Secondary | ICD-10-CM

## 2017-07-22 DIAGNOSIS — I340 Nonrheumatic mitral (valve) insufficiency: Secondary | ICD-10-CM

## 2017-07-22 DIAGNOSIS — R450 Nervousness: Secondary | ICD-10-CM | POA: Diagnosis not present

## 2017-07-22 DIAGNOSIS — D696 Thrombocytopenia, unspecified: Secondary | ICD-10-CM | POA: Diagnosis not present

## 2017-07-22 DIAGNOSIS — Z66 Do not resuscitate: Secondary | ICD-10-CM | POA: Diagnosis present

## 2017-07-22 DIAGNOSIS — E876 Hypokalemia: Secondary | ICD-10-CM | POA: Diagnosis not present

## 2017-07-22 DIAGNOSIS — I21A1 Myocardial infarction type 2: Secondary | ICD-10-CM | POA: Diagnosis not present

## 2017-07-22 DIAGNOSIS — C3431 Malignant neoplasm of lower lobe, right bronchus or lung: Secondary | ICD-10-CM | POA: Diagnosis not present

## 2017-07-22 DIAGNOSIS — Z87891 Personal history of nicotine dependence: Secondary | ICD-10-CM | POA: Diagnosis not present

## 2017-07-22 DIAGNOSIS — J449 Chronic obstructive pulmonary disease, unspecified: Secondary | ICD-10-CM | POA: Diagnosis not present

## 2017-07-22 DIAGNOSIS — F411 Generalized anxiety disorder: Secondary | ICD-10-CM | POA: Diagnosis not present

## 2017-07-22 DIAGNOSIS — K5641 Fecal impaction: Secondary | ICD-10-CM | POA: Diagnosis present

## 2017-07-22 DIAGNOSIS — Z9181 History of falling: Secondary | ICD-10-CM | POA: Diagnosis not present

## 2017-07-22 DIAGNOSIS — J431 Panlobular emphysema: Secondary | ICD-10-CM | POA: Diagnosis not present

## 2017-07-22 DIAGNOSIS — Z515 Encounter for palliative care: Secondary | ICD-10-CM | POA: Diagnosis not present

## 2017-07-22 DIAGNOSIS — I4891 Unspecified atrial fibrillation: Secondary | ICD-10-CM

## 2017-07-22 DIAGNOSIS — I481 Persistent atrial fibrillation: Secondary | ICD-10-CM | POA: Diagnosis not present

## 2017-07-22 DIAGNOSIS — K567 Ileus, unspecified: Secondary | ICD-10-CM | POA: Diagnosis not present

## 2017-07-22 DIAGNOSIS — Z818 Family history of other mental and behavioral disorders: Secondary | ICD-10-CM | POA: Diagnosis not present

## 2017-07-22 DIAGNOSIS — G894 Chronic pain syndrome: Secondary | ICD-10-CM | POA: Diagnosis not present

## 2017-07-22 DIAGNOSIS — I951 Orthostatic hypotension: Secondary | ICD-10-CM | POA: Diagnosis present

## 2017-07-22 DIAGNOSIS — J9611 Chronic respiratory failure with hypoxia: Secondary | ICD-10-CM | POA: Diagnosis not present

## 2017-07-22 DIAGNOSIS — K219 Gastro-esophageal reflux disease without esophagitis: Secondary | ICD-10-CM | POA: Diagnosis not present

## 2017-07-22 DIAGNOSIS — F419 Anxiety disorder, unspecified: Secondary | ICD-10-CM | POA: Diagnosis not present

## 2017-07-22 DIAGNOSIS — W19XXXA Unspecified fall, initial encounter: Secondary | ICD-10-CM | POA: Diagnosis not present

## 2017-07-22 DIAGNOSIS — E038 Other specified hypothyroidism: Secondary | ICD-10-CM | POA: Diagnosis not present

## 2017-07-22 DIAGNOSIS — W06XXXA Fall from bed, initial encounter: Secondary | ICD-10-CM | POA: Diagnosis present

## 2017-07-22 DIAGNOSIS — I10 Essential (primary) hypertension: Secondary | ICD-10-CM | POA: Diagnosis present

## 2017-07-22 DIAGNOSIS — R279 Unspecified lack of coordination: Secondary | ICD-10-CM | POA: Diagnosis not present

## 2017-07-22 DIAGNOSIS — R2689 Other abnormalities of gait and mobility: Secondary | ICD-10-CM | POA: Diagnosis not present

## 2017-07-22 DIAGNOSIS — Y92003 Bedroom of unspecified non-institutional (private) residence as the place of occurrence of the external cause: Secondary | ICD-10-CM | POA: Diagnosis not present

## 2017-07-22 DIAGNOSIS — K589 Irritable bowel syndrome without diarrhea: Secondary | ICD-10-CM | POA: Diagnosis not present

## 2017-07-22 DIAGNOSIS — Z7189 Other specified counseling: Secondary | ICD-10-CM | POA: Diagnosis not present

## 2017-07-22 DIAGNOSIS — R278 Other lack of coordination: Secondary | ICD-10-CM | POA: Diagnosis not present

## 2017-07-22 DIAGNOSIS — C3491 Malignant neoplasm of unspecified part of right bronchus or lung: Secondary | ICD-10-CM | POA: Diagnosis not present

## 2017-07-22 DIAGNOSIS — R339 Retention of urine, unspecified: Secondary | ICD-10-CM | POA: Diagnosis not present

## 2017-07-22 DIAGNOSIS — R41841 Cognitive communication deficit: Secondary | ICD-10-CM | POA: Diagnosis not present

## 2017-07-22 LAB — LIPID PANEL
CHOLESTEROL: 159 mg/dL (ref 0–200)
HDL: 60 mg/dL (ref >40)
LDL CALC: 90 mg/dL (ref 0–99)
TRIGLYCERIDES: 44 mg/dL (ref <150)
Total CHOL/HDL Ratio: 2.7 RATIO
VLDL: 9 mg/dL (ref 0–40)

## 2017-07-22 LAB — URINALYSIS, ROUTINE W REFLEX MICROSCOPIC
BILIRUBIN URINE: NEGATIVE
Glucose, UA: NEGATIVE mg/dL
HGB URINE DIPSTICK: NEGATIVE
KETONES UR: NEGATIVE mg/dL
Nitrite: NEGATIVE
PROTEIN: NEGATIVE mg/dL
Specific Gravity, Urine: 1.015 (ref 1.005–1.030)
Squamous Epithelial / LPF: NONE SEEN
pH: 5 (ref 5.0–8.0)

## 2017-07-22 LAB — BASIC METABOLIC PANEL
Anion gap: 12 (ref 5–15)
BUN: 16 mg/dL (ref 6–20)
CALCIUM: 8.1 mg/dL — AB (ref 8.9–10.3)
CO2: 22 mmol/L (ref 22–32)
Chloride: 105 mmol/L (ref 101–111)
Creatinine, Ser: 0.78 mg/dL (ref 0.44–1.00)
GFR calc Af Amer: >60 mL/min (ref >60)
GLUCOSE: 100 mg/dL — AB (ref 65–99)
Potassium: 3.6 mmol/L (ref 3.5–5.1)
Sodium: 139 mmol/L (ref 135–145)

## 2017-07-22 LAB — CBC
HCT: 27 % — ABNORMAL LOW (ref 36.0–46.0)
Hemoglobin: 8.7 g/dL — ABNORMAL LOW (ref 12.0–15.0)
MCH: 31.3 pg (ref 26.0–34.0)
MCHC: 32.2 g/dL (ref 30.0–36.0)
MCV: 97.1 fL (ref 78.0–100.0)
PLATELETS: 110 10*3/uL — AB (ref 150–400)
RBC: 2.78 MIL/uL — ABNORMAL LOW (ref 3.87–5.11)
RDW: 18.3 % — AB (ref 11.5–15.5)
WBC: 8.1 10*3/uL (ref 4.0–10.5)

## 2017-07-22 LAB — ECHOCARDIOGRAM COMPLETE
Height: 70 in
Weight: 2528 oz

## 2017-07-22 LAB — TSH: TSH: 34.565 u[IU]/mL — AB (ref 0.350–4.500)

## 2017-07-22 LAB — TROPONIN I
TROPONIN I: 0.07 ng/mL — AB (ref <0.03)
Troponin I: 0.04 ng/mL (ref <0.03)
Troponin I: 0.07 ng/mL (ref <0.03)

## 2017-07-22 LAB — T4, FREE: FREE T4: 0.41 ng/dL — AB (ref 0.61–1.12)

## 2017-07-22 LAB — HEMOGLOBIN A1C
HEMOGLOBIN A1C: 6 % — AB (ref 4.8–5.6)
Mean Plasma Glucose: 125.5 mg/dL

## 2017-07-22 MED ORDER — AZELASTINE HCL 0.1 % NA SOLN: 2.0000 | Freq: Every evening | NASAL | Status: DC | PRN

## 2017-07-22 MED ORDER — LEVALBUTEROL HCL 1.25 MG/0.5ML IN NEBU
1.2500 mg | INHALATION_SOLUTION | Freq: Four times a day (QID) | RESPIRATORY_TRACT | Status: DC
  Administered 2017-07-22 (×2): 1.25 mg via RESPIRATORY_TRACT
  Filled 2017-07-22 (×6): qty 0.5

## 2017-07-22 MED ORDER — LACTULOSE 10 GM/15ML PO SOLN
20.0000 g | Freq: Three times a day (TID) | ORAL | Status: DC
  Administered 2017-07-22 – 2017-07-23 (×4): 20 g via ORAL
  Filled 2017-07-22 (×6): qty 30

## 2017-07-22 MED ORDER — ACETAMINOPHEN 325 MG PO TABS
650.0000 mg | ORAL_TABLET | Freq: Four times a day (QID) | ORAL | Status: DC | PRN
  Filled 2017-07-22: qty 2

## 2017-07-22 MED ORDER — OXYCODONE-ACETAMINOPHEN 5-325 MG PO TABS: 1.0000 | ORAL_TABLET | ORAL | Status: DC | PRN

## 2017-07-22 MED ORDER — ZOLPIDEM TARTRATE 5 MG PO TABS: 5.0000 mg | ORAL_TABLET | Freq: Every evening | ORAL | Status: DC | PRN

## 2017-07-22 MED ORDER — VITAMIN D 1000 UNITS PO TABS
1000.0000 [IU] | ORAL_TABLET | Freq: Every day | ORAL | Status: DC
  Administered 2017-07-22 – 2017-07-29 (×8): 1000 [IU] via ORAL
  Filled 2017-07-22 (×8): qty 1

## 2017-07-22 MED ORDER — LORAZEPAM 2 MG/ML IJ SOLN
0.5000 mg | Freq: Once | INTRAMUSCULAR | Status: AC
  Administered 2017-07-22: 0.5 mg via INTRAVENOUS
  Filled 2017-07-22: qty 1

## 2017-07-22 MED ORDER — METHOCARBAMOL 500 MG PO TABS: 500.0000 mg | ORAL_TABLET | Freq: Three times a day (TID) | ORAL | Status: DC | PRN

## 2017-07-22 MED ORDER — SODIUM CHLORIDE 0.9 % IV SOLN
Freq: Once | INTRAVENOUS | Status: AC
  Administered 2017-07-22: 01:00:00 via INTRAVENOUS

## 2017-07-22 MED ORDER — HYDRALAZINE HCL 20 MG/ML IJ SOLN: 5.0000 mg | INTRAMUSCULAR | Status: DC | PRN

## 2017-07-22 MED ORDER — ALPRAZOLAM 0.25 MG PO TABS
0.2500 mg | ORAL_TABLET | Freq: Two times a day (BID) | ORAL | Status: DC | PRN
  Administered 2017-07-23: 0.25 mg via ORAL
  Filled 2017-07-22: qty 1

## 2017-07-22 MED ORDER — APIXABAN 5 MG PO TABS
5.0000 mg | ORAL_TABLET | Freq: Two times a day (BID) | ORAL | Status: DC
  Administered 2017-07-22 – 2017-07-29 (×15): 5 mg via ORAL
  Filled 2017-07-22 (×18): qty 1

## 2017-07-22 MED ORDER — DILTIAZEM HCL 30 MG PO TABS
30.0000 mg | ORAL_TABLET | Freq: Three times a day (TID) | ORAL | Status: DC
  Administered 2017-07-22: 30 mg via ORAL
  Filled 2017-07-22 (×4): qty 1

## 2017-07-22 MED ORDER — POLYVINYL ALCOHOL 1.4 % OP SOLN: 1.0000 [drp] | Freq: Three times a day (TID) | OPHTHALMIC | Status: DC | PRN

## 2017-07-22 MED ORDER — SODIUM CHLORIDE 0.9 % IV SOLN
Freq: Once | INTRAVENOUS | Status: AC
  Administered 2017-07-22: 04:00:00 via INTRAVENOUS

## 2017-07-22 MED ORDER — IPRATROPIUM BROMIDE 0.02 % IN SOLN
0.5000 mg | Freq: Four times a day (QID) | RESPIRATORY_TRACT | Status: DC
  Administered 2017-07-22 (×3): 0.5 mg via RESPIRATORY_TRACT
  Filled 2017-07-22 (×3): qty 2.5

## 2017-07-22 MED ORDER — ONDANSETRON HCL 4 MG PO TABS: 4.0000 mg | ORAL_TABLET | Freq: Four times a day (QID) | ORAL | Status: DC | PRN

## 2017-07-22 MED ORDER — LEVOTHYROXINE SODIUM 100 MCG IV SOLR
37.5000 ug | Freq: Every day | INTRAVENOUS | Status: DC
  Administered 2017-07-22 – 2017-07-25 (×4): 37.5 ug via INTRAVENOUS
  Filled 2017-07-22 (×4): qty 5

## 2017-07-22 MED ORDER — MORPHINE SULFATE (PF) 4 MG/ML IV SOLN: 1.0000 mg | INTRAVENOUS | Status: DC | PRN

## 2017-07-22 MED ORDER — VITAMIN C 500 MG PO TABS
1000.0000 mg | ORAL_TABLET | Freq: Every day | ORAL | Status: DC
  Administered 2017-07-22 – 2017-07-29 (×8): 1000 mg via ORAL
  Filled 2017-07-22 (×8): qty 2

## 2017-07-22 MED ORDER — DILTIAZEM HCL 25 MG/5ML IV SOLN
10.0000 mg | Freq: Once | INTRAVENOUS | Status: AC
  Administered 2017-07-22: 10 mg via INTRAVENOUS
  Filled 2017-07-22: qty 5

## 2017-07-22 MED ORDER — POLYETHYLENE GLYCOL 3350 17 G PO PACK
17.0000 g | PACK | Freq: Two times a day (BID) | ORAL | Status: DC
  Administered 2017-07-22 – 2017-07-28 (×12): 17 g via ORAL
  Filled 2017-07-22 (×13): qty 1

## 2017-07-22 MED ORDER — DILTIAZEM LOAD VIA INFUSION
20.0000 mg | Freq: Once | INTRAVENOUS | Status: AC
  Administered 2017-07-22: 20 mg via INTRAVENOUS
  Filled 2017-07-22: qty 20

## 2017-07-22 MED ORDER — FOLIC ACID 1 MG PO TABS
1.0000 mg | ORAL_TABLET | Freq: Every day | ORAL | Status: DC
Start: 2017-07-22 — End: 2017-07-29
  Administered 2017-07-22 – 2017-07-29 (×8): 1 mg via ORAL
  Filled 2017-07-22 (×8): qty 1

## 2017-07-22 MED ORDER — DILTIAZEM HCL 30 MG PO TABS
30.0000 mg | ORAL_TABLET | Freq: Once | ORAL | Status: AC
  Administered 2017-07-22: 30 mg via ORAL
  Filled 2017-07-22: qty 1

## 2017-07-22 MED ORDER — SODIUM CHLORIDE 0.9 % IV BOLUS
500.0000 mL | Freq: Once | INTRAVENOUS | Status: AC
  Administered 2017-07-22: 500 mL via INTRAVENOUS

## 2017-07-22 MED ORDER — DILTIAZEM HCL-DEXTROSE 100-5 MG/100ML-% IV SOLN (PREMIX)
5.0000 mg/h | INTRAVENOUS | Status: DC
  Administered 2017-07-22: 5 mg/h via INTRAVENOUS
  Administered 2017-07-22: 10 mg/h via INTRAVENOUS
  Administered 2017-07-22: 12.5 mg/h via INTRAVENOUS
  Administered 2017-07-23 (×2): 10 mg/h via INTRAVENOUS
  Filled 2017-07-22 (×5): qty 100

## 2017-07-22 MED ORDER — ONDANSETRON HCL 4 MG/2ML IJ SOLN: 4.0000 mg | Freq: Four times a day (QID) | INTRAMUSCULAR | Status: DC | PRN

## 2017-07-22 MED ORDER — IOPAMIDOL (ISOVUE-300) INJECTION 61%
INTRAVENOUS | Status: AC
  Administered 2017-07-22: 100 mL
  Filled 2017-07-22: qty 100

## 2017-07-22 MED ORDER — DULOXETINE HCL 30 MG PO CPEP
30.0000 mg | ORAL_CAPSULE | Freq: Every day | ORAL | Status: DC
  Administered 2017-07-22 – 2017-07-23 (×2): 30 mg via ORAL
  Filled 2017-07-22 (×3): qty 1

## 2017-07-22 NOTE — ED Notes (Signed)
Paged MD per RN

## 2017-07-22 NOTE — Progress Notes (Signed)
PROGRESS NOTE    [name]  VQQ:[id number] DOB: 06-29-1927 DOA: 07/21/2017 PCP: Mackie Pai, PA-C   Brief Narrative:  84 y.o. 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   Patient states that she has been constipated in the past 3 weeks. She has abdominal distention, and abdominal pain. Her abdominal pain is diffuse,constant, moderate, dull, nonradiating. She states that her last bowel movement was 3 weeks ago. Denies nausea or vomiting. No fever or chills. Patient states that she accidentally fell off the bed at about 6 AM yesterday morning. She injured her lower back, left hip and left are upper arm. She has moderate pain in those places. She developed hematoma in left upper arm. Patient has mild dry cough and mild SOB, but no chest pain. Denies symptoms of UTI or unilateral weakness. Pt is found to have A fib with RVR with HR up to 129.   ED Course: pt was found to have WBC 6.3, negative troponin, electrolytes renal function okay, temperature normal, tachycardia, tachypnea, oxygen saturation 86% on room air. X-ray of left hip was negative. X-ray of the lumbar spine showed stable L1 compression deformity. X-ray of acute abdomen/chest showed constipation. CT of head and neck negative. Patient is placed on telemetry bed for observation.    Subjective: 4/3 A/O x4 but easily confused.  Negative CP, negative S OB negative abdominal pain.   Assessment & Plan:   Principal Problem:   Atrial fibrillation with RVR (HCC) Active Problems:   Constipation   Anxiety and depression   COPD (chronic obstructive pulmonary disease) (HCC)   Large cell carcinoma of left lung, stage 4 (HCC)   Adenocarcinoma of left lung metastatic to  liver [facility])   Lung cancer metastatic to bone [facility])   Fall   Compression fracture of L1 lumbar vertebra (HCC)  A. Fib with RVR ( CHA2DS2-VASc Score is 3) -Most likely multifactorial to include significant hypothyroidism, abdominal pain. -Rate controlled on Cardizem drip if controlled overnight will convert to p.o. - Continue Eliquis 5 mg BID  Elevated troponin -Most likely secondary to patient A. fib with RVR causing demand ischemia.  Patient asymptomatic currently -Continue to trend. - Echocardiogram not consistent with acute ischemia  Constipation -Patient has history of chronic constipation -CT scan abdomen pelvis negative for obstruction. - Treated with enema which relieved her constipation. -Continue current bowel regimen.  Fall -Appears to have been mechanical.  All radiological studies negative for acute fractures    Chronic pain syndrome -Secondary to stable L1 compression fracture   Depression/anxiety  -Continue home regimen Xanax and Cymbalta    COPD -Atrovent nebulizer QID -Xopenex nebulizer QID   Stage IV adenocarcinoma of left lung metastatic to liver and bone:  -Per oncology note patient chemotherapy currently Carboplatin/Alimta/Keytruda .  Last dose appears to have been administered on 3/18 - Contact Dr. Marin Olp oncology 4/4 and inform him patient hospitalized -See goals of care  Hypothyroidism -TSH= 34.5/ Free T4 = 0.41 - 4/3 start Synthroid IV 7.5 mcg daily.  Monitor electrolyte closely  Goals of care - 4/3 PALLIATIVE CARE:Patient with stage IV metastatic NSCLC to liver and bone.  Per RN Charlsie Merles telephone encounter with daughter patient is deteriorating and she does not feel that she can provide 24/7 observation for patient.  Consider changing code to DNR discuss  short-term vs long-term goals of care  -4/3 social work consult placed.  Per RN note patient's daughter unsure she can continue to care for mother at home, discussed resources that may be  available   DVT prophylaxis: Eliquis  Code Status: Full Family Communication: None Disposition Plan: TBD   Consultants:  None    Procedures/Significant Events:  4/2 CT head and CT C-spine:CT HEAD FINDINGS   Brain: No evidence of acute infarction, hemorrhage, hydrocephalus, extra-axial collection or mass lesion/mass effect. Cortical atrophy is identified. Extensive hypoattenuation in the subcortical and periventricular deep white matter consistent chronic microvascular ischemic change is also seen.   Vascular: No hyperdense vessel or unexpected calcification.   Skull: Intact.  No focal lesion.   Sinuses/Orbits: No acute abnormality.  Status post lens extraction.   Other: None.   CT CERVICAL SPINE FINDINGS   Alignment: 0.2 cm facet mediated anterolisthesis C3 on C4 is identified. Otherwise maintained.   Skull base and vertebrae: No acute fracture. No primary bone lesion or focal pathologic process.   Soft tissues and spinal canal: No prevertebral fluid or swelling. No visible canal hematoma.   Disc levels: Marked loss of disc space height is seen from C3 to C7. Advanced multilevel facet arthropathy is also noted. The facet joints are ankylosed bilaterally at C2-3 and C3-4.   4/2 CT L-spine:Stable L1 compression deformity.   Multilevel degenerative change without acute abnormality.  4/2 DG hips bilateral WO pelvis:Degenerative change without acute abnormality   4/3 Echocardiogram- Left ventricle: mild concentric hypertrophy.-LVEF= 55% to 60%.  - Mitral valve:  moderate regurgitation. - Left atrium: moderately dilated. - Tricuspid valve: moderate regurgitation. - Pulmonary arteries: PA peak pressure: 33 mm Hg (S). 4/3 CT abdomen pelvis W contrast: IMPRESSION: 1. No definite acute changes identified. 2. Moderate anterior abdominal wall hernia containing small bowel and transverse colon. No obvious obstruction. Colon is diffusely stool-filled. 3. Mild  extrahepatic bile duct dilatation without change. 4. Possible urinary retention. No bladder wall thickening or filling defects. 5. Small bilateral pleural effusions with basilar atelectasis or consolidation. 6. Nonspecific esophageal wall thickening. 7. Aortic atherosclerosis. 8. Small amount of free fluid in the presacral space and low pelvis is decreased since previous study. 9. Pelvic left kidney.  No evidence of renal obstruction.     I have personally reviewed and interpreted all radiology studies and my findings are as above.  VENTILATOR SETTINGS:   Cultures   Antimicrobials: Anti-infectives (From admission, onward)   None       Devices    LINES / TUBES:      Continuous Infusions: . diltiazem (CARDIZEM) infusion 15 mg/hr (07/22/17 0625)     Objective: Vitals:   07/22/17 0623 07/22/17 0624 07/22/17 0630 07/22/17 0702  BP: 100/71 100/71 102/84 99/80  Pulse: (!) 124  (!) 111 82  Resp: 14  20 (!) 21  Temp:      TempSrc:      SpO2: 95%  97% 99%  Weight:      Height:        Intake/Output Summary (Last 24 hours) at 07/22/2017 0836 Last data filed at 07/22/2017 0803 Gross per 24 hour  Intake 1435 ml  Output -  Net 1435 ml   Filed Weights   07/21/17 1820  Weight: 158 lb (71.7 kg)    Examination:  General: A/O x4, but mild confusion.  No acute respiratory distress Neck:  Negative scars, masses, torticollis, lymphadenopathy, JVD Lungs: Clear to auscultation bilaterally without wheezes or crackles Cardiovascular:  Irregular regular rhythm and rate, without murmur gallop or rub normal S1 and S2 Abdomen: negative abdominal pain, positive ventral hernia reducible (per patient chronic), positive soft, bowel sounds, no rebound, no ascites, no appreciable mass Extremities: No significant cyanosis, clubbing, or edema bilateral lower extremities Skin: Negative rashes, lesions, ulcers Psychiatric:  Negative depression, negative anxiety, negative fatigue,  negative mania.  Not sure patient has a good grasp of the seriousness of her cancer stage. Central nervous system:  Cranial nerves II through XII intact, tongue/uvula midline, all extremities muscle strength 5/5, sensation intact throughout, negative dysarthria, negative expressive aphasia, negative receptive aphasia.  .     Data Reviewed: Care during the described time interval was provided by me .  I have reviewed this patient's available data, including medical history, events of note, physical examination, and all test results as part of my evaluation.   CBC: Recent Labs  Lab 07/21/17 1954 07/22/17 0332  WBC 6.3 8.1  NEUTROABS 4.4  --   HGB 9.4* 8.7*  HCT 29.2* 27.0*  MCV 97.7 97.1  PLT 109* 127*   Basic Metabolic Panel: Recent Labs  Lab 07/21/17 1954 07/22/17 0332  NA 137 139  K 3.9 3.6  CL 101 105  CO2 24 22  GLUCOSE 83 100*  BUN 15 16  CREATININE 0.85 0.78  CALCIUM 8.8* 8.1*   GFR: Estimated Creatinine Clearance: 53.6 mL/min (by C-G formula based on SCr of 0.78 mg/dL). Liver Function Tests: Recent Labs  Lab 07/21/17 1954  AST 24  ALT 17  ALKPHOS 72  BILITOT 0.8  PROT 5.6*  ALBUMIN 2.9*   Recent Labs  Lab 07/21/17 1954  LIPASE 42   No results for input(s): AMMONIA in the last 168 hours. Coagulation Profile: No results for input(s): INR, PROTIME in the last 168 hours. Cardiac Enzymes: Recent Labs  Lab 07/21/17 1954 07/22/17 0332  CKTOTAL 36*  --   TROPONINI  --  0.04*   BNP (last 3 results) No results for input(s): PROBNP in the last 8760 hours. HbA1C: No results for input(s): HGBA1C in the last 72 hours. CBG: Recent Labs  Lab 07/20/17 1015  GLUCAP 101*   Lipid Profile: No results for input(s): CHOL, HDL, LDLCALC, TRIG, CHOLHDL, LDLDIRECT in the last 72 hours. Thyroid Function Tests: Recent Labs    07/22/17 0332  TSH 34.565*   Anemia Panel: No results for input(s): VITAMINB12, FOLATE, FERRITIN, TIBC, IRON, RETICCTPCT in the last  72 hours. Urine analysis:    Component Value Date/Time   COLORURINE YELLOW 07/21/2017 0304   APPEARANCEUR HAZY (A) 07/21/2017 0304   LABSPEC 1.015 07/21/2017 0304   PHURINE 5.0 07/21/2017 0304   GLUCOSEU NEGATIVE 07/21/2017 0304   GLUCOSEU NEGATIVE 11/14/2016 1612   HGBUR NEGATIVE 07/21/2017 0304   BILIRUBINUR NEGATIVE 07/21/2017 0304   BILIRUBINUR neg 06/19/2016 0950   KETONESUR NEGATIVE 07/21/2017 0304   PROTEINUR NEGATIVE 07/21/2017 0304   UROBILINOGEN 0.2 11/14/2016 1612   NITRITE NEGATIVE 07/21/2017 0304   LEUKOCYTESUR TRACE (A) 07/21/2017 0304   Sepsis Labs: @LABRCNTIP (procalcitonin:4,lacticidven:4)  )No results found for this or any previous visit (from the past 240 hour(s)).       Radiology Studies: Dg Lumbar Spine Complete  Result Date: 07/21/2017 CLINICAL DATA:  Low back pain EXAM: LUMBAR SPINE - COMPLETE 4+ VIEW COMPARISON:  05/28/2016 FINDINGS: Five lumbar type vertebral bodies are well visualized. Mild scoliosis concave to the right is noted. L1 compression deformity is noted stable from the prior CT examination. Multilevel disc space narrowing  and osteophytic changes are seen. The overlying soft tissues demonstrate changes of constipation. These are similar to that seen on recent PET-CT. IMPRESSION: Stable L1 compression deformity. Multilevel degenerative change without acute abnormality. Electronically Signed   By: Inez Catalina M.D.   On: 07/21/2017 19:40   Ct Head Wo Contrast  Result Date: 07/21/2017 CLINICAL DATA:  The patient fell off a bed today. Weakness. Initial encounter. EXAM: CT HEAD WITHOUT CONTRAST CT CERVICAL SPINE WITHOUT CONTRAST TECHNIQUE: Multidetector CT imaging of the head and cervical spine was performed following the standard protocol without intravenous contrast. Multiplanar CT image reconstructions of the cervical spine were also generated. COMPARISON:  Head CT scan 03/16/2017.  Brain MRI 04/16/2017. FINDINGS: CT HEAD FINDINGS Brain: No evidence  of acute infarction, hemorrhage, hydrocephalus, extra-axial collection or mass lesion/mass effect. Cortical atrophy is identified. Extensive hypoattenuation in the subcortical and periventricular deep white matter consistent chronic microvascular ischemic change is also seen. Vascular: No hyperdense vessel or unexpected calcification. Skull: Intact.  No focal lesion. Sinuses/Orbits: No acute abnormality.  Status post lens extraction. Other: None. CT CERVICAL SPINE FINDINGS Alignment: 0.2 cm facet mediated anterolisthesis C3 on C4 is identified. Otherwise maintained. Skull base and vertebrae: No acute fracture. No primary bone lesion or focal pathologic process. Soft tissues and spinal canal: No prevertebral fluid or swelling. No visible canal hematoma. Disc levels: Marked loss of disc space height is seen from C3 to C7. Advanced multilevel facet arthropathy is also noted. The facet joints are ankylosed bilaterally at C2-3 and C3-4. Upper chest: Clear. Other: None. IMPRESSION: No acute abnormality head or cervical spine. Atrophy and extensive chronic microvascular ischemic change. Cervical spondylosis. Electronically Signed   By: Inge Rise M.D.   On: 07/21/2017 19:14   Ct Cervical Spine Wo Contrast  Result Date: 07/21/2017 CLINICAL DATA:  The patient fell off a bed today. Weakness. Initial encounter. EXAM: CT HEAD WITHOUT CONTRAST CT CERVICAL SPINE WITHOUT CONTRAST TECHNIQUE: Multidetector CT imaging of the head and cervical spine was performed following the standard protocol without intravenous contrast. Multiplanar CT image reconstructions of the cervical spine were also generated. COMPARISON:  Head CT scan 03/16/2017.  Brain MRI 04/16/2017. FINDINGS: CT HEAD FINDINGS Brain: No evidence of acute infarction, hemorrhage, hydrocephalus, extra-axial collection or mass lesion/mass effect. Cortical atrophy is identified. Extensive hypoattenuation in the subcortical and periventricular deep white matter  consistent chronic microvascular ischemic change is also seen. Vascular: No hyperdense vessel or unexpected calcification. Skull: Intact.  No focal lesion. Sinuses/Orbits: No acute abnormality.  Status post lens extraction. Other: None. CT CERVICAL SPINE FINDINGS Alignment: 0.2 cm facet mediated anterolisthesis C3 on C4 is identified. Otherwise maintained. Skull base and vertebrae: No acute fracture. No primary bone lesion or focal pathologic process. Soft tissues and spinal canal: No prevertebral fluid or swelling. No visible canal hematoma. Disc levels: Marked loss of disc space height is seen from C3 to C7. Advanced multilevel facet arthropathy is also noted. The facet joints are ankylosed bilaterally at C2-3 and C3-4. Upper chest: Clear. Other: None. IMPRESSION: No acute abnormality head or cervical spine. Atrophy and extensive chronic microvascular ischemic change. Cervical spondylosis. Electronically Signed   By: Inge Rise M.D.   On: 07/21/2017 19:14   Ct Abdomen Pelvis W Contrast  Result Date: 07/22/2017 CLINICAL DATA:  Low back pain, abdominal pain, and constipation for 3 weeks. History of lung cancer metastatic to bone and liver. EXAM: CT ABDOMEN AND PELVIS WITH CONTRAST TECHNIQUE: Multidetector CT imaging of the abdomen and pelvis was  performed using the standard protocol following bolus administration of intravenous contrast. CONTRAST:  11mL ISOVUE-300 IOPAMIDOL (ISOVUE-300) INJECTION 61% COMPARISON:  05/28/2016 FINDINGS: Lower chest: Atelectasis or consolidation in the lung bases. Minimal bilateral pleural effusions. Emphysematous changes in the lungs. Small esophageal hiatal hernia. Distal esophageal wall thickening. This is likely due to reflux disease but could also indicate esophagitis or neoplasm. Hepatobiliary: No focal liver lesions identified. Gallbladder is unremarkable. Mild extrahepatic bile duct dilatation without change. No obstructing lesion identified. Pancreas: Unremarkable. No  pancreatic ductal dilatation or surrounding inflammatory changes. Spleen: Normal in size without focal abnormality. Adrenals/Urinary Tract: No adrenal gland nodules. Pelvic left kidney. Renal nephrograms are symmetrical. No hydronephrosis. Bladder is over distended, possibly indicating urinary retention. No wall thickening or filling defects. Stomach/Bowel: Stomach is decompressed. Small bowel are mostly decompressed. Surgical anastomosis in the left lower quadrant small bowel. Colon is diffusely stool-filled. There is a midline anterior abdominal wall hernia containing transverse colon and small bowel. No proximal dilatation to suggest obstruction. Appendix is surgically absent. Vascular/Lymphatic: Tortuous and calcified aorta. No aneurysm. No significant lymphadenopathy. Reproductive: Uterus and bilateral adnexa are unremarkable. Other: No free air in the abdomen. Free fluid in the pelvis and presacral space is decreased since previous study. No loculated fluid collections. Musculoskeletal: Degenerative changes in the spine and hips. No destructive bone lesions. Compression of the L1 vertebra without change. Multilevel degenerative disc disease. IMPRESSION: 1. No definite acute changes identified. 2. Moderate anterior abdominal wall hernia containing small bowel and transverse colon. No obvious obstruction. Colon is diffusely stool-filled. 3. Mild extrahepatic bile duct dilatation without change. 4. Possible urinary retention. No bladder wall thickening or filling defects. 5. Small bilateral pleural effusions with basilar atelectasis or consolidation. 6. Nonspecific esophageal wall thickening. 7. Aortic atherosclerosis. 8. Small amount of free fluid in the presacral space and low pelvis is decreased since previous study. 9. Pelvic left kidney.  No evidence of renal obstruction. Electronically Signed   By: Lucienne Capers M.D.   On: 07/22/2017 04:40   Nm Pet Image Restag (ps) Skull Base To Thigh  Result Date:  07/20/2017 CLINICAL DATA:  Subsequent treatment strategy for lung cancer. EXAM: NUCLEAR MEDICINE PET SKULL BASE TO THIGH TECHNIQUE: 7.5 mCi F-18 FDG was injected intravenously. Full-ring PET imaging was performed from the skull base to thigh after the radiotracer. CT data was obtained and used for attenuation correction and anatomic localization. Fasting blood glucose: 101 mg/dl COMPARISON:  06/11/2017. FINDINGS: Mediastinal blood pool activity: SUV max 1.7 NECK: No hypermetabolic lymph nodes. Incidental CT findings: None. CHEST: No hypermetabolic mediastinal, hilar or axillary lymph nodes. No hypermetabolic pulmonary nodules. Specifically, a 0.9 x 1.4 cm nodule in the medial right lower lobe does not show metabolism above blood pool. Incidental CT findings: Atherosclerotic calcification of the arterial vasculature, including coronary arteries. Heart is enlarged. No pericardial effusion. There may be trace right pleural fluid. ABDOMEN/PELVIS: No abnormal hypermetabolism in the liver, adrenal glands, spleen or pancreas. No hypermetabolic lymph nodes. Incidental CT findings: None. SKELETON: No abnormal osseous hypermetabolism. Incidental CT findings: Degenerative changes in the spine and hips. IMPRESSION: 1. No residual abnormal hypermetabolism in the neck, chest, abdomen or pelvis. Residual nodule in the medial right lower lobe does not show hypermetabolism above blood pool. 2. Probable trace right pleural fluid. 3.  Aortic atherosclerosis (ICD10-170.0). Electronically Signed   By: Lorin Picket M.D.   On: 07/20/2017 14:06   Dg Abd Acute W/chest  Result Date: 07/21/2017 CLINICAL DATA:  Abdominal pain and distension  EXAM: DG ABDOMEN ACUTE W/ 1V CHEST COMPARISON:  07/13/2017 FINDINGS: Cardiac shadow is mildly enlarged. Aortic calcifications are noted. Mild left basilar atelectasis is seen. No focal infiltrate is noted. Scattered large and small bowel gas is seen. No free air is noted. Retained fecal material is  noted consistent with a degree of constipation. No definitive obstructive changes are seen. IMPRESSION: Changes of constipation without obstruction. No other focal abnormality is noted. Electronically Signed   By: Inez Catalina M.D.   On: 07/21/2017 19:38   Dg Humerus Left  Result Date: 07/22/2017 CLINICAL DATA:  Fall.  Bruising left mid humerus. EXAM: LEFT HUMERUS - 2+ VIEW COMPARISON:  None. FINDINGS: Degenerative changes in the left shoulder and left elbow. No evidence of acute fracture or dislocation. No focal bone lesion or bone destruction. Soft tissues are unremarkable. IMPRESSION: Degenerative changes in the left shoulder and elbow. No acute bony abnormalities. Electronically Signed   By: Lucienne Capers M.D.   On: 07/22/2017 04:48   Dg Hips Bilat W Or Wo Pelvis 3-4 Views  Result Date: 07/21/2017 CLINICAL DATA:  Pelvic pain EXAM: DG HIP (WITH OR WITHOUT PELVIS) 4V BILAT COMPARISON:  None. FINDINGS: Pelvic ring is intact. Degenerative changes of the hip joints are noted bilaterally. No acute fracture or dislocation is seen. No gross soft tissue abnormality is noted. Degenerative changes of the pubic symphysis are seen. IMPRESSION: Degenerative change without acute abnormality. Electronically Signed   By: Inez Catalina M.D.   On: 07/21/2017 19:42        Scheduled Meds: . apixaban  5 mg Oral BID  . cholecalciferol  1,000 Units Oral Daily  . diltiazem  30 mg Oral Q8H  . DULoxetine  30 mg Oral Daily  . folic acid  1 mg Oral Daily  . ipratropium  0.5 mg Nebulization Q6H  . lactulose  20 g Oral TID  . levalbuterol  1.25 mg Nebulization Q6H  . polyethylene glycol  17 g Oral BID  . vitamin C  1,000 mg Oral Daily   Continuous Infusions: . diltiazem (CARDIZEM) infusion 15 mg/hr (07/22/17 0625)     LOS: 0 days    Time spent: 40 minutes    WOODS, Geraldo Docker, MD [facility] Pager [phone]   If 7PM-7AM, please contact night-coverage [email or web address] Password [facility] 07/22/2017,  8:36 AM

## 2017-07-22 NOTE — ED Notes (Signed)
Notified by lab that troponin specimen was not enough, ED phlebotomist notified for assistance.

## 2017-07-22 NOTE — ED Notes (Signed)
Lunch tray ordered by this RN

## 2017-07-22 NOTE — ED Notes (Signed)
Patient transported to CT 

## 2017-07-22 NOTE — ED Provider Notes (Signed)
Patient signed out to me at shift change.  Patient presented for constipation.  Was treated in the emergency department, but was noted to also have A. fib with RVR.  Was given her home dose of Cardizem with no improvement of her symptoms.  Her rate is still in the 120s-140s.  We will give IV Cardizem bolus and infusion.  This is a chronic problem for the patient.  Unclear onset of this episode.  Cardioversion not indicated.  Patient takes Eliquis.  CRITICAL CARE Performed by: Montine Circle  Management of afib with RVR requiring IV bolus and infusion of rate controlling medication. Total critical care time:36 minutes  Critical care time was exclusive of separately billable procedures and treating other patients.  Critical care was necessary to treat or prevent imminent or life-threatening deterioration.  Critical care was time spent personally by me on the following activities: development of treatment plan with patient and/or surrogate as well as nursing, discussions with consultants, evaluation of patient's response to treatment, examination of patient, obtaining history from patient or surrogate, ordering and performing treatments and interventions, ordering and review of laboratory studies, ordering and review of radiographic studies, pulse oximetry and re-evaluation of patient's condition.   Discussed with Dr. Roxanne Mins, who agrees with plan for admission.  I reassessed the patient's abdomen, she has no tenderness, and is feeling much better in regard to her constipation and abdominal pain.   Appreciate Dr. Blaine Hamper for admitting patient.   Montine Circle, PA-C 06/00/45 9977    Delora Fuel, MD 41/42/39 [phone]

## 2017-07-22 NOTE — ED Notes (Signed)
Contacted MD; instructed to continue running IV cardizem but hold oral dose for now

## 2017-07-22 NOTE — ED Notes (Signed)
Contacted pharmacy concerning missing meds

## 2017-07-22 NOTE — Progress Notes (Signed)
  Echocardiogram 2D Echocardiogram has been performed.  [name] 07/22/2017, 10:49 AM

## 2017-07-22 NOTE — ED Notes (Signed)
Pt to Echo

## 2017-07-22 NOTE — H&P (Addendum)
History and Physical    [name] OYD:[id number] DOB: 1927/12/15 DOA: 07/21/2017  Referring MD/NP/PA:   PCP: Mackie Pai, PA-C   Patient coming from:  The patient is coming from home.  At baseline, pt is independent for most of ADL.  Chief Complaint: constipation, abdominal pain, abdominal distention, fall  HPI: [name] is a 84 y.o. female with medical history significant of atrial fibrillation on Eliquis, hyperlipidemia, GERD, depression, anxiety, metastasized non-small cell lung cancer stage IV, who presents with constipation, abdominal pain, abdominal distention, fall.  Patient states that she has been constipated in the past 3 weeks. She has abdominal distention, and abdominal pain. Her abdominal pain is diffuse,constant, moderate, dull, nonradiating. She states that her last bowel movement was 3 weeks ago. Denies nausea or vomiting. No fever or chills. Patient states that she accidentally fell off the bed at about 6 AM yesterday morning. She injured her lower back, left hip and left are upper arm. She has moderate pain in those places. She developed hematoma in left upper arm. Patient has mild dry cough and mild SOB, but no chest pain. Denies symptoms of UTI or unilateral weakness. Pt is found to have A fib with RVR with HR up to 129.  ED Course: pt was found to have WBC 6.3, negative troponin, electrolytes renal function okay, temperature normal, tachycardia, tachypnea, oxygen saturation 86% on room air. X-ray of left hip was negative. X-ray of the lumbar spine showed stable L1 compression deformity. X-ray of acute abdomen/chest showed constipation. CT of head and neck negative. Patient is placed on telemetry bed for observation.  Review of Systems:   General: no fevers, chills, no body weight gain, has poor appetite, has fatigue HEENT: no blurry vision, hearing changes or sore throat Respiratory: has dyspnea, coughing, no wheezing CV: no chest pain, no  palpitations GI: no nausea, vomiting, diarrhea, has abdominal pain and distension and constipation GU: no dysuria, burning on urination, increased urinary frequency, hematuria  Ext: no leg edema Neuro: no unilateral weakness, numbness, or tingling, no vision change or hearing loss Skin:  Has hematoma in left upper arm. MSK: has back pain and left upper arm pain. Heme: No easy bruising.  Travel history: No recent long distant travel.  Allergy:  Allergies  Allergen Reactions  . Adhesive [Tape] Other (See Comments)    Caused blisters  . Amoxicillin Other (See Comments)    Has patient had a PCN reaction causing immediate rash, facial/tongue/throat swelling, SOB or lightheadedness with hypotension: Yes Has patient had a PCN reaction causing severe rash involving mucus membranes or skin necrosis: No Has patient had a PCN reaction that required hospitalization No Has patient had a PCN reaction occurring within the last 10 years: Yes If all of the above answers are "NO", then may proceed with Cephalosporin use.   Caused thrush  . Ciprofloxacin Other (See Comments)    Caused thrush  . Ultram [Tramadol] Other (See Comments)    hypotension    Past Medical History:  Diagnosis Date  . Adenocarcinoma of left lung metastatic to liver ([place]) 04/24/2017  . Anxiety   . Arthritis   . Atrial fibrillation ([place])   . Cardiac arrhythmia due to congenital heart disease   . Chronic constipation   . COPD (chronic obstructive pulmonary disease) ([place])   . Depression   . GERD (gastroesophageal reflux disease)   . Hemorrhoid   . Hyperlipidemia   . Idiopathic hypotension   . Large cell carcinoma of left lung,  stage 4 ([place]) 04/24/2017  . Lightheadedness 11/2015  . Lung cancer metastatic to bone ([place]) 04/24/2017  . Mass of lower lobe of right lung 03/25/2017  . Melanoma of skin ([place]) 12/06/2016  . Mycotic toenails 10/27/2012  . Parkinson disease ([place])   . Recurrent UTI   . Tubulovillous adenoma of colon  02/1992  . Varicose veins     Past Surgical History:  Procedure Laterality Date  . APPENDECTOMY  84 years old  . COLON RESECTION  2008  . melanoma removal Left    left cheek  . vitriectomy  08-2015    Social History:  reports that she quit smoking about 37 years ago. Her smoking use included cigarettes. She has a 60.00 pack-year smoking history. She has never used smokeless tobacco. She reports that she does not drink alcohol or use drugs.  Family History:  Family History  Problem Relation Age of Onset  . Asthma Brother   . Alcohol abuse Brother   . Throat cancer Father   . Alcohol abuse Father   . Lupus Daughter   . Bipolar disorder Daughter   . Lupus Daughter   . Anxiety disorder Daughter   . Alcohol abuse Sister   . Alcohol abuse Maternal Grandfather   . Alcohol abuse Paternal Grandmother   . Breast cancer Sister   . Colon cancer Neg Hx      Prior to Admission medications   Medication Sig Start Date End Date Taking? Authorizing Provider  acetaminophen (TYLENOL) 325 MG tablet Take 2 tablets (650 mg total) by mouth every 6 (six) hours as needed for mild pain (or Fever >/= 101). 08/29/16   Catherine Pour, MD  albuterol (VENTOLIN HFA) 108 (90 BASE) MCG/ACT inhaler Inhale 2 puffs into the lungs 2 (two) times daily as needed for wheezing or shortness of breath. 02/05/15   Catherine Minium, MD  ALPRAZolam Duanne Moron) 0.5 MG tablet TAKE 1/2 TO 1 TABLET BY MOUTH 2 TIMES A DAY AS NEEDED FOR ANXIETY 07/20/17   Catherine Napoleon, MD  ALPRAZolam Duanne Moron) 0.5 MG tablet Take 0.5-1 tablets (0.25-0.5 mg total) by mouth 2 (two) times daily as needed for anxiety. 07/20/17   Catherine Napoleon, MD  apixaban (ELIQUIS) 5 MG TABS tablet Take 5 mg by mouth 2 (two) times daily.    [provider]  Ascorbic Acid (VITAMIN C) 1000 MG tablet Take 1,000 mg by mouth daily.    [provider]  azelastine (ASTELIN) 0.1 % nasal spray Place 2 sprays into both nostrils at bedtime as needed for rhinitis.  Use in each nostril as directed 07/14/16   Saguier, Catherine Miller, PA-C  Cholecalciferol (VITAMIN D) 400 UNIT/ML LIQD Take 400 Units by mouth daily.     [provider]  dexamethasone (DECADRON) 4 MG tablet Take 1 tab two times a day the day before Alimta chemo. Take 2 tabs two times a day starting the day after chemo for 3 days. 06/15/17   Catherine Napoleon, MD  diltiazem (CARDIZEM) 30 MG tablet Take 1 tablet (30 mg total) by mouth 4 (four) times daily. Patient taking differently: Take 30 mg by mouth 3 (three) times daily.  05/19/17   Saguier, Catherine Miller, PA-C  DULoxetine (CYMBALTA) 30 MG capsule TAKE 1 CAPSULE DAILY BY MOUTH. 06/08/17   Saguier, Catherine Miller, PA-C  folic acid (FOLVITE) 1 MG tablet Take 1 tablet (1 mg total) by mouth daily. Take daily starting 5-7 days before Alimta chemo. Continue until 21 days after Alimta completed. 04/28/17  Catherine Napoleon, MD  lactulose (CHRONULAC) 10 GM/15ML solution Take 15 mLs (10 g total) by mouth every 4 (four) hours as needed for mild constipation. 06/02/17   Catherine Napoleon, MD  ondansetron (ZOFRAN) 8 MG tablet Take 1 tablet (8 mg total) by mouth every 8 (eight) hours as needed for nausea or vomiting. 04/07/17   Catherine Napoleon, MD  polyethylene glycol powder (GLYCOLAX/MIRALAX) powder Take 17 g by mouth 2 (two) times daily. Patient taking differently: Take 17 g by mouth 2 (two) times daily as needed (for constipation.).  05/28/16   Waynetta Pean, PA-C  prochlorperazine (COMPAZINE) 10 MG tablet Take 1 tablet (10 mg total) by mouth every 6 (six) hours as needed for nausea or vomiting. 04/16/17   Catherine Napoleon, MD  Propylene Glycol (SYSTANE BALANCE OP) Apply 2 drops to eye 3 (three) times daily as needed (for dry/irritated eyes).     [provider]    Physical Exam: Vitals:   07/22/17 0145 07/22/17 0206 07/22/17 0220 07/22/17 0230  BP:  (!) 225/189 120/79 101/65  Pulse: (!) 114  (!) 107 (!) 102  Resp: 20  (!) 33 (!) 35  Temp:      TempSrc:        SpO2: (!) 86%  92% 92%  Weight:      Height:       General: Not in acute distress HEENT:       Eyes: PERRL, EOMI, no scleral icterus.       ENT: No discharge from the ears and nose, no pharynx injection, no tonsillar enlargement.        Neck: No JVD, no bruit, no mass felt. Heme: No neck lymph node enlargement. Cardiac: S1/S2, irregularly irregular rhythm. No murmurs, No gallops or rubs. Respiratory: No rales, wheezing, rhonchi or rubs. GI: Soft, nondistended, nontender, no rebound pain, no organomegaly, BS present. GU: No hematuria Ext: No pitting leg edema bilaterally. 2+DP/PT pulse bilaterally. Musculoskeletal: has tenderness in lower back and left upper arm. Skin: Has hematoma in left upper arm. Neuro: Alert, oriented X3, cranial nerves II-XII grossly intact, moves all extremities normally. Psych: Patient is not psychotic, no suicidal or hemocidal ideation.  Labs on Admission: I have personally reviewed following labs and imaging studies  CBC: Recent Labs  Lab 07/21/17 1954 07/22/17 0332  WBC 6.3 8.1  NEUTROABS 4.4  --   HGB 9.4* 8.7*  HCT 29.2* 27.0*  MCV 97.7 97.1  PLT 109* 220*   Basic Metabolic Panel: Recent Labs  Lab 07/21/17 1954 07/22/17 0332  NA 137 139  K 3.9 3.6  CL 101 105  CO2 24 22  GLUCOSE 83 100*  BUN 15 16  CREATININE 0.85 0.78  CALCIUM 8.8* 8.1*   GFR: Estimated Creatinine Clearance: 53.6 mL/min (by C-G formula based on SCr of 0.78 mg/dL). Liver Function Tests: Recent Labs  Lab 07/21/17 1954  AST 24  ALT 17  ALKPHOS 72  BILITOT 0.8  PROT 5.6*  ALBUMIN 2.9*   Recent Labs  Lab 07/21/17 1954  LIPASE 42   No results for input(s): AMMONIA in the last 168 hours. Coagulation Profile: No results for input(s): INR, PROTIME in the last 168 hours. Cardiac Enzymes: Recent Labs  Lab 07/21/17 1954 07/22/17 0332  CKTOTAL 36*  --   TROPONINI  --  0.04*   BNP (last 3 results) No results for input(s): PROBNP in the last 8760  hours. HbA1C: No results for input(s): HGBA1C in the last 72 hours.  CBG: Recent Labs  Lab 07/20/17 1015  GLUCAP 101*   Lipid Profile: No results for input(s): CHOL, HDL, LDLCALC, TRIG, CHOLHDL, LDLDIRECT in the last 72 hours. Thyroid Function Tests: Recent Labs    07/22/17 0332  TSH 34.565*   Anemia Panel: No results for input(s): VITAMINB12, FOLATE, FERRITIN, TIBC, IRON, RETICCTPCT in the last 72 hours. Urine analysis:    Component Value Date/Time   COLORURINE YELLOW 07/21/2017 0304   APPEARANCEUR HAZY (A) 07/21/2017 0304   LABSPEC 1.015 07/21/2017 0304   PHURINE 5.0 07/21/2017 0304   GLUCOSEU NEGATIVE 07/21/2017 0304   GLUCOSEU NEGATIVE 11/14/2016 1612   HGBUR NEGATIVE 07/21/2017 0304   BILIRUBINUR NEGATIVE 07/21/2017 0304   BILIRUBINUR neg 06/19/2016 0950   KETONESUR NEGATIVE 07/21/2017 0304   PROTEINUR NEGATIVE 07/21/2017 0304   UROBILINOGEN 0.2 11/14/2016 1612   NITRITE NEGATIVE 07/21/2017 0304   LEUKOCYTESUR TRACE (A) 07/21/2017 0304   Sepsis Labs: @LABRCNTIP (procalcitonin:4,lacticidven:4) )No results found for this or any previous visit (from the past 240 hour(s)).   Radiological Exams on Admission: Dg Lumbar Spine Complete  Result Date: 07/21/2017 CLINICAL DATA:  Low back pain EXAM: LUMBAR SPINE - COMPLETE 4+ VIEW COMPARISON:  05/28/2016 FINDINGS: Five lumbar type vertebral bodies are well visualized. Mild scoliosis concave to the right is noted. L1 compression deformity is noted stable from the prior CT examination. Multilevel disc space narrowing and osteophytic changes are seen. The overlying soft tissues demonstrate changes of constipation. These are similar to that seen on recent PET-CT. IMPRESSION: Stable L1 compression deformity. Multilevel degenerative change without acute abnormality. Electronically Signed   By: Inez Catalina M.D.   On: 07/21/2017 19:40   Ct Head Wo Contrast  Result Date: 07/21/2017 CLINICAL DATA:  The patient fell off a bed today.  Weakness. Initial encounter. EXAM: CT HEAD WITHOUT CONTRAST CT CERVICAL SPINE WITHOUT CONTRAST TECHNIQUE: Multidetector CT imaging of the head and cervical spine was performed following the standard protocol without intravenous contrast. Multiplanar CT image reconstructions of the cervical spine were also generated. COMPARISON:  Head CT scan 03/16/2017.  Brain MRI 04/16/2017. FINDINGS: CT HEAD FINDINGS Brain: No evidence of acute infarction, hemorrhage, hydrocephalus, extra-axial collection or mass lesion/mass effect. Cortical atrophy is identified. Extensive hypoattenuation in the subcortical and periventricular deep white matter consistent chronic microvascular ischemic change is also seen. Vascular: No hyperdense vessel or unexpected calcification. Skull: Intact.  No focal lesion. Sinuses/Orbits: No acute abnormality.  Status post lens extraction. Other: None. CT CERVICAL SPINE FINDINGS Alignment: 0.2 cm facet mediated anterolisthesis C3 on C4 is identified. Otherwise maintained. Skull base and vertebrae: No acute fracture. No primary bone lesion or focal pathologic process. Soft tissues and spinal canal: No prevertebral fluid or swelling. No visible canal hematoma. Disc levels: Marked loss of disc space height is seen from C3 to C7. Advanced multilevel facet arthropathy is also noted. The facet joints are ankylosed bilaterally at C2-3 and C3-4. Upper chest: Clear. Other: None. IMPRESSION: No acute abnormality head or cervical spine. Atrophy and extensive chronic microvascular ischemic change. Cervical spondylosis. Electronically Signed   By: Inge Rise M.D.   On: 07/21/2017 19:14   Ct Cervical Spine Wo Contrast  Result Date: 07/21/2017 CLINICAL DATA:  The patient fell off a bed today. Weakness. Initial encounter. EXAM: CT HEAD WITHOUT CONTRAST CT CERVICAL SPINE WITHOUT CONTRAST TECHNIQUE: Multidetector CT imaging of the head and cervical spine was performed following the standard protocol without  intravenous contrast. Multiplanar CT image reconstructions of the cervical spine were also generated. COMPARISON:  Head CT scan 03/16/2017.  Brain MRI 04/16/2017. FINDINGS: CT HEAD FINDINGS Brain: No evidence of acute infarction, hemorrhage, hydrocephalus, extra-axial collection or mass lesion/mass effect. Cortical atrophy is identified. Extensive hypoattenuation in the subcortical and periventricular deep white matter consistent chronic microvascular ischemic change is also seen. Vascular: No hyperdense vessel or unexpected calcification. Skull: Intact.  No focal lesion. Sinuses/Orbits: No acute abnormality.  Status post lens extraction. Other: None. CT CERVICAL SPINE FINDINGS Alignment: 0.2 cm facet mediated anterolisthesis C3 on C4 is identified. Otherwise maintained. Skull base and vertebrae: No acute fracture. No primary bone lesion or focal pathologic process. Soft tissues and spinal canal: No prevertebral fluid or swelling. No visible canal hematoma. Disc levels: Marked loss of disc space height is seen from C3 to C7. Advanced multilevel facet arthropathy is also noted. The facet joints are ankylosed bilaterally at C2-3 and C3-4. Upper chest: Clear. Other: None. IMPRESSION: No acute abnormality head or cervical spine. Atrophy and extensive chronic microvascular ischemic change. Cervical spondylosis. Electronically Signed   By: Inge Rise M.D.   On: 07/21/2017 19:14   Ct Abdomen Pelvis W Contrast  Result Date: 07/22/2017 CLINICAL DATA:  Low back pain, abdominal pain, and constipation for 3 weeks. History of lung cancer metastatic to bone and liver. EXAM: CT ABDOMEN AND PELVIS WITH CONTRAST TECHNIQUE: Multidetector CT imaging of the abdomen and pelvis was performed using the standard protocol following bolus administration of intravenous contrast. CONTRAST:  134mL ISOVUE-300 IOPAMIDOL (ISOVUE-300) INJECTION 61% COMPARISON:  05/28/2016 FINDINGS: Lower chest: Atelectasis or consolidation in the lung  bases. Minimal bilateral pleural effusions. Emphysematous changes in the lungs. Small esophageal hiatal hernia. Distal esophageal wall thickening. This is likely due to reflux disease but could also indicate esophagitis or neoplasm. Hepatobiliary: No focal liver lesions identified. Gallbladder is unremarkable. Mild extrahepatic bile duct dilatation without change. No obstructing lesion identified. Pancreas: Unremarkable. No pancreatic ductal dilatation or surrounding inflammatory changes. Spleen: Normal in size without focal abnormality. Adrenals/Urinary Tract: No adrenal gland nodules. Pelvic left kidney. Renal nephrograms are symmetrical. No hydronephrosis. Bladder is over distended, possibly indicating urinary retention. No wall thickening or filling defects. Stomach/Bowel: Stomach is decompressed. Small bowel are mostly decompressed. Surgical anastomosis in the left lower quadrant small bowel. Colon is diffusely stool-filled. There is a midline anterior abdominal wall hernia containing transverse colon and small bowel. No proximal dilatation to suggest obstruction. Appendix is surgically absent. Vascular/Lymphatic: Tortuous and calcified aorta. No aneurysm. No significant lymphadenopathy. Reproductive: Uterus and bilateral adnexa are unremarkable. Other: No free air in the abdomen. Free fluid in the pelvis and presacral space is decreased since previous study. No loculated fluid collections. Musculoskeletal: Degenerative changes in the spine and hips. No destructive bone lesions. Compression of the L1 vertebra without change. Multilevel degenerative disc disease. IMPRESSION: 1. No definite acute changes identified. 2. Moderate anterior abdominal wall hernia containing small bowel and transverse colon. No obvious obstruction. Colon is diffusely stool-filled. 3. Mild extrahepatic bile duct dilatation without change. 4. Possible urinary retention. No bladder wall thickening or filling defects. 5. Small bilateral  pleural effusions with basilar atelectasis or consolidation. 6. Nonspecific esophageal wall thickening. 7. Aortic atherosclerosis. 8. Small amount of free fluid in the presacral space and low pelvis is decreased since previous study. 9. Pelvic left kidney.  No evidence of renal obstruction. Electronically Signed   By: Lucienne Capers M.D.   On: 07/22/2017 04:40   Nm Pet Image Restag (ps) Skull Base To Thigh  Result Date: 07/20/2017 CLINICAL DATA:  Subsequent  treatment strategy for lung cancer. EXAM: NUCLEAR MEDICINE PET SKULL BASE TO THIGH TECHNIQUE: 7.5 mCi F-18 FDG was injected intravenously. Full-ring PET imaging was performed from the skull base to thigh after the radiotracer. CT data was obtained and used for attenuation correction and anatomic localization. Fasting blood glucose: 101 mg/dl COMPARISON:  06/11/2017. FINDINGS: Mediastinal blood pool activity: SUV max 1.7 NECK: No hypermetabolic lymph nodes. Incidental CT findings: None. CHEST: No hypermetabolic mediastinal, hilar or axillary lymph nodes. No hypermetabolic pulmonary nodules. Specifically, a 0.9 x 1.4 cm nodule in the medial right lower lobe does not show metabolism above blood pool. Incidental CT findings: Atherosclerotic calcification of the arterial vasculature, including coronary arteries. Heart is enlarged. No pericardial effusion. There may be trace right pleural fluid. ABDOMEN/PELVIS: No abnormal hypermetabolism in the liver, adrenal glands, spleen or pancreas. No hypermetabolic lymph nodes. Incidental CT findings: None. SKELETON: No abnormal osseous hypermetabolism. Incidental CT findings: Degenerative changes in the spine and hips. IMPRESSION: 1. No residual abnormal hypermetabolism in the neck, chest, abdomen or pelvis. Residual nodule in the medial right lower lobe does not show hypermetabolism above blood pool. 2. Probable trace right pleural fluid. 3.  Aortic atherosclerosis (ICD10-170.0). Electronically Signed   By: Lorin Picket M.D.   On: 07/20/2017 14:06   Dg Abd Acute W/chest  Result Date: 07/21/2017 CLINICAL DATA:  Abdominal pain and distension EXAM: DG ABDOMEN ACUTE W/ 1V CHEST COMPARISON:  07/13/2017 FINDINGS: Cardiac shadow is mildly enlarged. Aortic calcifications are noted. Mild left basilar atelectasis is seen. No focal infiltrate is noted. Scattered large and small bowel gas is seen. No free air is noted. Retained fecal material is noted consistent with a degree of constipation. No definitive obstructive changes are seen. IMPRESSION: Changes of constipation without obstruction. No other focal abnormality is noted. Electronically Signed   By: Inez Catalina M.D.   On: 07/21/2017 19:38   Dg Humerus Left  Result Date: 07/22/2017 CLINICAL DATA:  Fall.  Bruising left mid humerus. EXAM: LEFT HUMERUS - 2+ VIEW COMPARISON:  None. FINDINGS: Degenerative changes in the left shoulder and left elbow. No evidence of acute fracture or dislocation. No focal bone lesion or bone destruction. Soft tissues are unremarkable. IMPRESSION: Degenerative changes in the left shoulder and elbow. No acute bony abnormalities. Electronically Signed   By: Lucienne Capers M.D.   On: 07/22/2017 04:48   Dg Hips Bilat W Or Wo Pelvis 3-4 Views  Result Date: 07/21/2017 CLINICAL DATA:  Pelvic pain EXAM: DG HIP (WITH OR WITHOUT PELVIS) 4V BILAT COMPARISON:  None. FINDINGS: Pelvic ring is intact. Degenerative changes of the hip joints are noted bilaterally. No acute fracture or dislocation is seen. No gross soft tissue abnormality is noted. Degenerative changes of the pubic symphysis are seen. IMPRESSION: Degenerative change without acute abnormality. Electronically Signed   By: Inez Catalina M.D.   On: 07/21/2017 19:42     EKG: Independently reviewed.  A. Fib with RVR, low voltage, nonspecific T-wave change.   Assessment/Plan Principal Problem:   Atrial fibrillation with RVR (HCC) Active Problems:   Constipation   Anxiety and depression    COPD (chronic obstructive pulmonary disease) (HCC)   Large cell carcinoma of left lung, stage 4 (HCC)   Adenocarcinoma of left lung metastatic to liver [facility])   Lung cancer metastatic to bone [facility])   Fall   Compression fracture of L1 lumbar vertebra (HCC)   Atrial fibrillation with RVR ([place]): heart rate up to 129. Likely triggered by pain. No chest  pain. Pt was treated initially with two dose of IV cardizem in ED, but her HR remains fast. Currently no CP. CHA2DS2-VASc Score is 3, needs oral anticoagulation. Patient is on Eliquis at home.  -will place on SDU for obs -continue Cardizem gtt  -continue home oral cardizem and Eliquis -trop x 3, FLP and A1c, TSH - 2d echo -IVF: NS 500 cc x 2 and then 100 cc/h  Constipation: x-ray of acute abdomen/chest showed constipation. On physical examination, patient has moderate bdominal distention and significant tenderness, will get CT scan to r/o obstruction. -IVF as above -Miralax and Lactulose -f/u CT-abd/pelvis -prn Zofran for nausea, morphine for pain  Fall: seems to have had Mechanical fall. CT of the head and neck negative. X-ray of left hip is negative. X-ray of lumbar spine showed a stable L1 compression, which explain her back pain. Patient also has pain in left upper arm where there is a hematoma. Will need to rule out fracture in left arm -When necessary Percocet for pain -X-ray of left upper arm  Depression and anxiety: Stable, no suicidal or homicidal ideations. -Continue home medications: and Xanax, Cymbalta  COPD (chronic obstructive pulmonary disease) (HCC) and chronic respiratory failure: no wheezing or rhonchi on auscultation. - atrovent nebs and When necessary Xopenex nebulizer  Stage IV adenocarcinoma of left lung metastatic to liver and bone: f/u with Dr. Marin Olp. On chemotherapy. Last dose was 2 weeks ago per patient. -Follow-up with a doctor Ennever   DVT ppx:on Eliquis Code Status: Full code Family Communication: None  at bed side.      Disposition Plan:  Anticipate discharge back to previous home environment Consults called:  none Admission status: Obs / tele     Date of Service 07/22/2017    Ivor Costa [facility] Pager [phone]  If 7PM-7AM, please contact night-coverage [email or web address] Password [facility] 07/22/2017, 5:17 AM

## 2017-07-22 NOTE — ED Notes (Signed)
Pt is now back from Echo Study. Sitter at bedside.

## 2017-07-22 NOTE — Telephone Encounter (Signed)
Received phone call from [place]. She states during the night the patient called EMS and was brought to [facility] ED. She's now planning on being admitted.   She continues to express concern over her mother and the ability to care for her in the phone. I shared with her that the NP from Palliative felt like the patient needed 24/7 observation and wouldn't be safe in the home by herself. Daughter became tearful stating her mom doesn't want to be in a facility, but she is unable to be present all the time, and she is unable to afford private care. Explained to the daughter, that with the patient being in the hospital, now was a good time to explore options and avenues for patient disposition. Suggested that she request a PT evaluation to see if patient might qualify for Rehab placement at discharge to again, give the family and patient some time to work out long term plans.   During the call, [name] become tearful multiple times. States her sister, the patient's other daughter is not helpful with this situation. She explains that she has multiple, significant health concerns. She states she feels overwhelmed and like the blame and complete load of the patient's needs have been placed solely on her. Sympathized with her situation and recommended that she reach out for help and to practice self care as needed. She states she has a list of resources that she will be contacting Paramedic, etc).   Will attempt to reach out to hospital social worker once patient is admitted to inpatient unit from ED.

## 2017-07-23 ENCOUNTER — Encounter (HOSPITAL_COMMUNITY): Payer: Self-pay

## 2017-07-23 DIAGNOSIS — Z515 Encounter for palliative care: Secondary | ICD-10-CM

## 2017-07-23 DIAGNOSIS — Z7189 Other specified counseling: Secondary | ICD-10-CM

## 2017-07-23 LAB — BASIC METABOLIC PANEL
ANION GAP: 9 (ref 5–15)
BUN: 11 mg/dL (ref 6–20)
CHLORIDE: 105 mmol/L (ref 101–111)
CO2: 22 mmol/L (ref 22–32)
Calcium: 7.9 mg/dL — ABNORMAL LOW (ref 8.9–10.3)
Creatinine, Ser: 0.67 mg/dL (ref 0.44–1.00)
GFR calc Af Amer: >60 mL/min (ref >60)
Glucose, Bld: 81 mg/dL (ref 65–99)
POTASSIUM: 3.1 mmol/L — AB (ref 3.5–5.1)
SODIUM: 136 mmol/L (ref 135–145)

## 2017-07-23 LAB — MAGNESIUM: MAGNESIUM: 1.6 mg/dL — AB (ref 1.7–2.4)

## 2017-07-23 LAB — CBC
HEMATOCRIT: 25.7 % — AB (ref 36.0–46.0)
HEMOGLOBIN: 8.5 g/dL — AB (ref 12.0–15.0)
MCH: 32.9 pg (ref 26.0–34.0)
MCHC: 33.1 g/dL (ref 30.0–36.0)
MCV: 99.6 fL (ref 78.0–100.0)
Platelets: 134 10*3/uL — ABNORMAL LOW (ref 150–400)
RBC: 2.58 MIL/uL — AB (ref 3.87–5.11)
RDW: 19 % — ABNORMAL HIGH (ref 11.5–15.5)
WBC: 4.2 10*3/uL (ref 4.0–10.5)

## 2017-07-23 LAB — PHOSPHORUS: PHOSPHORUS: 2.6 mg/dL (ref 2.5–4.6)

## 2017-07-23 LAB — AMMONIA: AMMONIA: 26 umol/L (ref 9–35)

## 2017-07-23 LAB — MRSA PCR SCREENING: MRSA by PCR: NEGATIVE

## 2017-07-23 LAB — TROPONIN I: TROPONIN I: 0.04 ng/mL — AB (ref <0.03)

## 2017-07-23 MED ORDER — MAGNESIUM CITRATE PO SOLN
0.5000 | Freq: Once | ORAL | Status: DC
  Filled 2017-07-23: qty 296

## 2017-07-23 MED ORDER — IPRATROPIUM BROMIDE 0.02 % IN SOLN
0.5000 mg | Freq: Three times a day (TID) | RESPIRATORY_TRACT | Status: DC
  Administered 2017-07-23 – 2017-07-24 (×4): 0.5 mg via RESPIRATORY_TRACT
  Filled 2017-07-23 (×4): qty 2.5

## 2017-07-23 MED ORDER — LEVALBUTEROL HCL 1.25 MG/0.5ML IN NEBU
1.2500 mg | INHALATION_SOLUTION | Freq: Three times a day (TID) | RESPIRATORY_TRACT | Status: DC
  Administered 2017-07-23 – 2017-07-24 (×4): 1.25 mg via RESPIRATORY_TRACT
  Filled 2017-07-23 (×4): qty 0.5

## 2017-07-23 MED ORDER — DILTIAZEM HCL 60 MG PO TABS
60.0000 mg | ORAL_TABLET | Freq: Three times a day (TID) | ORAL | Status: DC
Start: 2017-07-23 — End: 2017-07-25
  Administered 2017-07-23 – 2017-07-25 (×5): 60 mg via ORAL
  Filled 2017-07-23 (×5): qty 1

## 2017-07-23 MED ORDER — DULOXETINE HCL 30 MG PO CPEP
30.0000 mg | ORAL_CAPSULE | Freq: Two times a day (BID) | ORAL | Status: DC
  Administered 2017-07-23 – 2017-07-29 (×12): 30 mg via ORAL
  Filled 2017-07-23 (×12): qty 1

## 2017-07-23 MED ORDER — MAGNESIUM SULFATE 2 GM/50ML IV SOLN
2.0000 g | Freq: Once | INTRAVENOUS | Status: AC
  Administered 2017-07-23: 2 g via INTRAVENOUS
  Filled 2017-07-23: qty 50

## 2017-07-23 MED ORDER — POTASSIUM CHLORIDE 10 MEQ/100ML IV SOLN
10.0000 meq | INTRAVENOUS | Status: AC
  Administered 2017-07-23 (×5): 10 meq via INTRAVENOUS
  Filled 2017-07-23 (×5): qty 100

## 2017-07-23 MED ORDER — LACTULOSE 10 GM/15ML PO SOLN
20.0000 g | Freq: Three times a day (TID) | ORAL | Status: DC
  Administered 2017-07-23 – 2017-07-25 (×5): 20 g via ORAL
  Filled 2017-07-23 (×5): qty 30

## 2017-07-23 MED ORDER — DILTIAZEM HCL-DEXTROSE 100-5 MG/100ML-% IV SOLN (PREMIX): 5.0000 mg/h | INTRAVENOUS | Status: AC

## 2017-07-23 NOTE — Progress Notes (Signed)
PT Cancellation Note  Patient Details Name: [name] MRN: [id number] DOB: 10/04/1927   Cancelled Treatment:    Reason Eval/Treat Not Completed: Patient at procedure or test/unavailable.  Pt not in room when available to see pt.  Pt transferred out of the unit. 07/23/2017  Donnella Sham, [place] [phone]  (pager)   Tessie Fass Jazmine Heckman 07/23/2017, 6:37 PM

## 2017-07-23 NOTE — Consult Note (Signed)
Consultation Note Date: 07/23/2017   Patient Name: [name]  DOB: Oct 27, 1927  MRN: [id number]  Age / Sex: 84 y.o., female  PCP: Elise Benne Referring Physician: Allie Bossier, MD  Reason for Consultation: Establishing goals of care  HPI/Patient Profile: 84 y.o. 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 Found to be in a fib RVR on admission - admitted to stepdown for cardizem drip. Last chemo 07/06/17.  PMT consulted for [place].   Clinical Assessment and Goals of Care: I have reviewed medical records including EPIC notes, labs and imaging, received report from RN, assessed the patient and then met at the bedside  to discuss diagnosis prognosis, GOC, EOL wishes, disposition and options.  I introduced Palliative Medicine as specialized medical care for people living with serious illness. It focuses on providing relief from the symptoms and stress of a serious illness. The goal is to improve quality of life for both the patient and the family.  We discussed a brief life review of the patient. 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 Has 2 daughters - one in [place] and one in [place]. She moved to [place] from [place] 30 years ago. Her husband passed away 12 years ago after 89 years of marriage. She has always been very active - ran the [facility] [place] marathon in her 56s. She has a cat at home that is very important to her.  As far as functional and nutritional status, the patient tells me she is still able to care for herself and complete all ADLs. Good appetite. However, her daughter, [name], tells me separately that the patient has become unable to care for herself and she believes it is d/t mental illness. Per Dr. Marin Olp, pt has history of Bipolar. The daughter  tells me about multiple falls and she believes the patient stopped taking her medication.    We discussed her current illness and what it means in the larger context of her on-going co-morbidities.  Natural disease trajectory and expectations at EOL were discussed. She tells me about how sick she feels after chemo - fatigue and weakness. She tells me she received her cancer diagnosis December 2018 and has been very weak since - missing out on some of her favorite activities, such as going to church, since the diagnosis. She does tell me her current pain and constipation are better since she has been admitted.   I attempted to elicit values and goals of care important to the patient. Her faith is important to her. She is a member of a [employer]. Quality of life over length of life is important to her.   The difference between aggressive medical intervention and comfort care was considered in light of the patient's goals of care. The patient wants to continue to treat the treatable and wants to discuss further cancer treatment with Dr. Marin Olp. However, she would not want to be resuscitated if she were to arrest despite all other aggressive medical interventions.   Advanced directives, concepts specific to code status, artifical feeding and hydration, and rehospitalization were considered and discussed. We discussed code status and she asked that I change her status to DNR. We also talked about advanced directives, she tells me she has a living will but does not remember what is in it. I also explained  to her that her children would automatically become her decision makers if she were unable to make decisions for herself - she was uncomfortable with this. We discussed HCPOA paperwork but she is not sure of anyone else that she would want to be her HCPOA. Encouraged ongoing conversations between her and her family about her end of life wishes.   Patient plans on being discharged to SNF with rehab -  is agreeable to this and hopes to return home after rehab.  Hospice and Palliative Care services outpatient were explained and offered. Patient was already receiving outpatient palliative services. She was interested in palliative services in SNF.  After receiving permission from patient, called daughter, [name], and had lengthy conversation about mother's condition. She had a lot of questions about her mother's health care and disposition plan. All questions were addressed. Also confirmed her mother's DNR decision.    Questions and concerns were addressed.The patient and family was encouraged to call with questions or concerns.   Primary Decision Maker PATIENT    SUMMARY OF RECOMMENDATIONS   -code status changed to DNR - SNF w/pallaitve care - MD please write for palliative to follow patient at SNF in discharge summary -PMT will follow  Code Status/Advance Care Planning:  DNR   Symptom Management:   Per primary - currently pain controlled and constipation being treated  Palliative Prophylaxis:   Bowel Regimen, Delirium Protocol and Frequent Pain Assessment  Additional Recommendations (Limitations, Scope, Preferences):  Full Scope Treatment, DNR   Psycho-social/Spiritual:   Desire for further Chaplaincy support:yes  Prognosis:   Unable to determine  Discharge Planning: [facility] for rehab with Palliative care service follow-up      Primary Diagnoses: Present on Admission: . Atrial fibrillation with RVR ([place]) . Fall . Large cell carcinoma of left lung, stage 4 ([place]) . Anxiety and depression . Adenocarcinoma of left lung metastatic to liver ([place]) . Lung cancer metastatic to bone ([place]) . Compression fracture of L1 lumbar vertebra ([facility]) . Constipation . COPD (chronic obstructive pulmonary disease) ([place])   I have reviewed the medical record, interviewed the patient and family, and examined the patient. The following aspects are pertinent.  Past  Medical History:  Diagnosis Date  . Adenocarcinoma of left lung metastatic to liver ([place]) 04/24/2017  . Anxiety   . Arthritis   . Atrial fibrillation ([place])   . Cardiac arrhythmia due to congenital heart disease   . Chronic constipation   . COPD (chronic obstructive pulmonary disease) ([place])   . Depression   . GERD (gastroesophageal reflux disease)   . Hemorrhoid   . Hyperlipidemia   . Idiopathic hypotension   . Large cell carcinoma of left lung, stage 4 ([place]) 04/24/2017  . Lightheadedness 11/2015  . Lung cancer metastatic to bone ([place]) 04/24/2017  . Mass of lower lobe of right lung 03/25/2017  . Melanoma of skin ([place]) 12/06/2016  . Mycotic toenails 10/27/2012  . Parkinson disease ([place])   . Recurrent UTI   . 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 Varicose veins    Social History   Socioeconomic History  . Marital status: Widowed    Spouse name: Not on file  . Number of children: 2  . Years of education: Not on file  . Highest education level: Not on file  Occupational History  . Occupation: retiredFirefighter , Animator  Social Needs  . Financial resource strain: Not on file  . Food insecurity:    Worry: Not  on file    Inability: Not on file  . Transportation needs:    Medical: Not on file    Non-medical: Not on file  Tobacco Use  . Smoking status: Former Smoker    Packs/day: 2.00    Years: 30.00    Pack years: 60.00    Types: Cigarettes    Last attempt to quit: 04/21/1980    Years since quitting: 37.2  . Smokeless tobacco: Never Used  . Tobacco comment: onset age 55 -65, up to > 1ppd (almost 2 ppd)  Substance and Sexual Activity  . Alcohol use: No    Alcohol/week: 0.0 oz  . Drug use: No  . Sexual activity: Never  Lifestyle  . Physical activity:    Days per week: Not on file    Minutes per session: Not on file  . Stress: Not on file  Relationships  . Social connections:    Talks on phone: Not on file    Gets together: Not on file    Attends  religious service: Not on file    Active member of club or organization: Not on file    Attends meetings of clubs or organizations: Not on file    Relationship status: Not on file  Other Topics Concern  . Not on file  Social History Narrative   Widowed, lives alone. 1 child in [place] and 1 in [place]. 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   Still drives as of [age]   Family History  Problem Relation Age of Onset  . Asthma Brother   . Alcohol abuse Brother   . Throat cancer Father   . Alcohol abuse Father   . Lupus Daughter   . Bipolar disorder Daughter   . Lupus Daughter   . Anxiety disorder Daughter   . Alcohol abuse Sister   . Alcohol abuse Maternal Grandfather   . Alcohol abuse Paternal Grandmother   . Breast cancer Sister   . Colon cancer Neg Hx    Scheduled Meds: . apixaban  5 mg Oral BID  . cholecalciferol  1,000 Units Oral Daily  . DULoxetine  30 mg Oral Daily  . folic acid  1 mg Oral Daily  . ipratropium  0.5 mg Nebulization TID  . lactulose  20 g Oral TID  . levalbuterol  1.25 mg Nebulization TID  . levothyroxine  37.5 mcg Intravenous Daily  . polyethylene glycol  17 g Oral BID  . vitamin C  1,000 mg Oral Daily   Continuous Infusions: . diltiazem (CARDIZEM) infusion 10 mg/hr (07/23/17 1205)  . potassium chloride Stopped (07/23/17 1210)   PRN Meds:.acetaminophen, ALPRAZolam, azelastine, hydrALAZINE, methocarbamol, morphine injection, ondansetron **OR** ondansetron (ZOFRAN) IV, oxyCODONE-acetaminophen, polyvinyl alcohol, zolpidem Allergies  Allergen Reactions  . Adhesive [Tape] Other (See Comments)    Caused blisters  . Amoxicillin Other (See Comments)    Has patient had a PCN reaction causing immediate rash, facial/tongue/throat swelling, SOB or lightheadedness with hypotension: Yes Has patient had a PCN reaction causing severe rash involving mucus membranes or skin necrosis: No Has patient had a PCN reaction that required hospitalization No Has  patient had a PCN reaction occurring within the last 10 years: Yes If all of the above answers are "NO", then may proceed with Cephalosporin use.   Caused thrush  . Ciprofloxacin Other (See Comments)    Caused thrush  . Ultram [Tramadol] Other (See Comments)    hypotension   Review of Systems  Constitutional: Positive  for activity change and fatigue. Negative for appetite change and unexpected weight change.  Respiratory: Negative for shortness of breath.   Cardiovascular: Negative for chest pain, palpitations and leg swelling.  Gastrointestinal: Positive for abdominal distention, abdominal pain and constipation. Negative for nausea and vomiting.  Neurological: Positive for weakness.  Psychiatric/Behavioral: Positive for confusion.    Physical Exam  Constitutional: She is oriented to person, place, and time. She appears well-developed and well-nourished. No distress.  HENT:  Head: Normocephalic and atraumatic.  Cardiovascular: Normal rate. An irregularly irregular rhythm present.  Pulses:      Radial pulses are 2+ on the right side, and 2+ on the left side.       Dorsalis pedis pulses are 2+ on the right side, and 2+ on the left side.  Pulmonary/Chest: Breath sounds normal. No accessory muscle usage. No respiratory distress.  Abdominal: She exhibits distension. Bowel sounds are decreased. There is tenderness.  Musculoskeletal:       Right lower leg: She exhibits no edema.       Left lower leg: She exhibits no edema.  Neurological: She is alert and oriented to person, place, and time.  Periods of confusion, easily reoriented.   Skin: Skin is warm and dry.  Psychiatric: She has a normal mood and affect. Her speech is normal and behavior is normal. She is attentive.    Vital Signs: BP 106/87 (BP Location: Right Arm)   Pulse 65   Temp 98.6 F (37 C)   Resp 20   Ht _0  (1.778 m)   Wt 71.7 kg (158 lb)   SpO2 97%   BMI 22.67 kg/m  Pain Scale: 0-10   Pain Score: 0-No  pain   SpO2: SpO2: 97 % O2 Device:SpO2: 97 % O2 Flow Rate: .O2 Flow Rate (L/min): 3 L/min  IO: Intake/output summary:   Intake/Output Summary (Last 24 hours) at 07/23/2017 1317 Last data filed at 07/23/2017 1050 Gross per 24 hour  Intake 12828.87 ml  Output -  Net 12828.87 ml    LBM: Last BM Date: 07/23/17 Baseline Weight: Weight: 71.7 kg (158 lb) Most recent weight: Weight: 71.7 kg (158 lb)     Palliative Assessment/Data: PPS 60%     Time In: 1300 Time Out: 1500 Time Total: 120 minutes Greater than 50%  of this time was spent counseling and coordinating care related to the above assessment and plan.  Juel Burrow, DNP, AGNP-C Palliative Medicine Team [phone]

## 2017-07-23 NOTE — Progress Notes (Signed)
Transferred -in from 4N by bed awake and alert.

## 2017-07-23 NOTE — Progress Notes (Signed)
PROGRESS NOTE    [name]  GGY:[id number] DOB: [birth year] DOA: 07/21/2017 PCP: Mackie Pai, PA-C   Brief Narrative:  84 y.o. 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   Patient states that she has been constipated in the past 3 weeks. She has abdominal distention, and abdominal pain. Her abdominal pain is diffuse,constant, moderate, dull, nonradiating. She states that her last bowel movement was 3 weeks ago. Denies nausea or vomiting. No fever or chills. Patient states that she accidentally fell off the bed at about 6 AM yesterday morning. She injured her lower back, left hip and left are upper arm. She has moderate pain in those places. She developed hematoma in left upper arm. Patient has mild dry cough and mild SOB, but no chest pain. Denies symptoms of UTI or unilateral weakness. Pt is found to have A fib with RVR with HR up to 129.   ED Course: pt was found to have WBC 6.3, negative troponin, electrolytes renal function okay, temperature normal, tachycardia, tachypnea, oxygen saturation 86% on room air. X-ray of left hip was negative. X-ray of the lumbar spine showed stable L1 compression deformity. X-ray of acute abdomen/chest showed constipation. CT of head and neck negative. Patient is placed on telemetry bed for observation.    Subjective: 4/4 A/O x4, much more awake and alert today.  Negative CP, negative S OB, positive abdominal pain, positive nausea.  States still feels constipated despite enema and lactulose.  Positive anxiety.    Assessment & Plan:   Principal Problem:   Atrial fibrillation with RVR (HCC) Active Problems:   Constipation   Anxiety and depression   COPD (chronic obstructive pulmonary  disease) (HCC)   Large cell carcinoma of left lung, stage 4 (HCC)   Adenocarcinoma of left lung metastatic to liver [facility])   Lung cancer metastatic to bone [facility])   Fall   Compression fracture of L1 lumbar vertebra (HCC)  A. Fib with RVR ( CHA2DS2-VASc Score is 3) -Most likely multifactorial to include significant hypothyroidism, abdominal pain. -Rate controlled on Cardizem drip overnight.  DC Cardizem drip.  -4/4 Cardizem 60 mg TID - Continue Eliquis 5 mg BID  Elevated troponin -Most likely secondary to patient A. fib with RVR causing demand ischemia.  Patient asymptomatic currently Recent Labs  Lab 07/22/17 0332 07/22/17 0923 07/22/17 1855  TROPONINI 0.04* 0.07* 0.07*  - Echocardiogram not consistent with acute ischemia  Constipation -Patient has history of chronic constipation -CT scan abdomen pelvis negative for obstruction. - Treated with enema which relieved her constipation. -4/4 increase bowel regimen lactulose + magnesium citrate   Fall -Appears to have been mechanical.  All radiological studies negative for acute fractures  Chronic pain syndrome -Stable L1 compression fracture    Depression/anxiety  -Continue home regimen   Xanax 0.25 mg BID PRN  4/4 increase Cymbalta 30 mg BID   COPD -Atrovent nebulizer QID -Xopenex nebulizer QID   Stage IV adenocarcinoma of left lung metastatic to liver and bone:  -Per oncology note patient chemotherapy currently Carboplatin/Alimta/Keytruda .  Last dose appears to have been administered on 3/18 - Contact Dr. Marin Olp oncology 4/4 and inform him patient hospitalized -See goals of care  Hypothyroidism -TSH= 34.5/ Free T4 = 0.41 - 4/3 start Synthroid IV 7.5 mcg daily.  Monitor electrolyte closely  Hypokalemia  -  Potassium goal> 4 -Potassium IV 50 mEq  Hypomagnesemia  -Magnesium goal> 2 - Magnesium IV 2 g     Goals of care - 4/3 PALLIATIVE CARE:Patient with stage IV metastatic NSCLC to liver and bone.  Per RN  Charlsie Merles telephone encounter with daughter patient is deteriorating and she does not feel that she can provide 24/7 observation for patient.  Consider changing code to DNR discuss short-term vs long-term goals of care  -4/3 social work consult placed.  Per RN note patient's daughter unsure she can continue to care for mother at home, discussed resources that may be available   DVT prophylaxis: Eliquis  Code Status: Full Family Communication: None Disposition Plan: TBD   Consultants:  None    Procedures/Significant Events:  4/2 CT head and CT C-spine:CT HEAD FINDINGS   Brain: No evidence of acute infarction, hemorrhage, hydrocephalus, extra-axial collection or mass lesion/mass effect. Cortical atrophy is identified. Extensive hypoattenuation in the subcortical and periventricular deep white matter consistent chronic microvascular ischemic change is also seen.   Vascular: No hyperdense vessel or unexpected calcification.   Skull: Intact.  No focal lesion.   Sinuses/Orbits: No acute abnormality.  Status post lens extraction.   Other: None.   CT CERVICAL SPINE FINDINGS   Alignment: 0.2 cm facet mediated anterolisthesis C3 on C4 is identified. Otherwise maintained.   Skull base and vertebrae: No acute fracture. No primary bone lesion or focal pathologic process.   Soft tissues and spinal canal: No prevertebral fluid or swelling. No visible canal hematoma.   Disc levels: Marked loss of disc space height is seen from C3 to C7. Advanced multilevel facet arthropathy is also noted. The facet joints are ankylosed bilaterally at C2-3 and C3-4.   4/2 CT L-spine:-Stable L1 compression deformity. 4/2 DG hips bilateral WO pelvis:Degenerative change without acute abnormality  4/3 Echocardiogram- -LVEF= 55% to 60%.  - Mitral valve:  moderate regurgitation. - Left atrium: moderately dilated. - Tricuspid valve: moderate regurgitation. - Pulmonary arteries: PA peak pressure: 33 mm  Hg (S). 4/3 CT abdomen pelvis W contrast: - Moderate anterior abdominal wall hernia containing small bowel and transverse colon. No obvious obstruction. Colon is diffusely stool-filled. -Possible urinary retention. No bladder wall thickening or filling defects. 5. Small bilateral pleural effusions with basilar atelectasis or consolidation. 6. Nonspecific esophageal wall thickening. 7. Aortic atherosclerosis. 8. Small amount of free fluid in the presacral space and low pelvis is decreased since previous study.      I have personally reviewed and interpreted all radiology studies and my findings are as above.  VENTILATOR SETTINGS:   Cultures   Antimicrobials: Anti-infectives (From admission, onward)   None       Devices    LINES / TUBES:      Continuous Infusions: . diltiazem (CARDIZEM) infusion 10 mg/hr (07/23/17 0600)     Objective: Vitals:   07/23/17 0700 07/23/17 0750 07/23/17 0800 07/23/17 0822  BP: 99/65  98/76   Pulse: 77  98   Resp: 13  (!) 26   Temp:  98.4 F (36.9 C)    TempSrc:      SpO2: 96%  95% 97%  Weight:      Height:        Intake/Output Summary (Last 24 hours) at 07/23/2017 0859 Last data filed at 07/22/2017 1425 Gross per 24 hour  Intake 240 ml  Output -  Net 240 ml   Filed Weights   07/21/17 1820  Weight: 158 lb (71.7 kg)  Physical Exam:  General: A/O x4, No acute respiratory distress Neck:  Negative scars, masses, torticollis, lymphadenopathy, JVD Lungs: Clear to auscultation bilaterally without wheezes or crackles Cardiovascular: Irregular regular rhythm and rate, without murmur gallop or rub normal S1 and S2 Abdomen: negative abdominal pain, positive ventral hernia reducible (per patient chronic), positive soft, bowel sounds, no rebound, no ascites, no appreciable mass Extremities: No significant cyanosis, clubbing, or edema bilateral lower extremities Skin: Negative rashes, lesions, ulcers Psychiatric:  Negative  depression, positive anxiety, negative fatigue, negative mania.  Not sure patient has a good grasp of the seriousness of her cancer stage. Central nervous system:  Cranial nerves II through XII intact, tongue/uvula midline, all extremities muscle strength 5/5, sensation intact throughout, negative dysarthria, negative expressive aphasia, negative receptive aphasia.  .     Data Reviewed: Care during the described time interval was provided by me .  I have reviewed this patient's available data, including medical history, events of note, physical examination, and all test results as part of my evaluation.   CBC: Recent Labs  Lab 07/21/17 1954 07/22/17 0332 07/23/17 0745  WBC 6.3 8.1 4.2  NEUTROABS 4.4  --   --   HGB 9.4* 8.7* 8.5*  HCT 29.2* 27.0* 25.7*  MCV 97.7 97.1 99.6  PLT 109* 110* 932*   Basic Metabolic Panel: Recent Labs  Lab 07/21/17 1954 07/22/17 0332 07/23/17 0745  NA 137 139 136  K 3.9 3.6 3.1*  CL 101 105 105  CO2 24 22 22   GLUCOSE 83 100* 81  BUN 15 16 11   CREATININE 0.85 0.78 0.67  CALCIUM 8.8* 8.1* 7.9*  MG  --   --  1.6*  PHOS  --   --  2.6   GFR: Estimated Creatinine Clearance: 53.6 mL/min (by C-G formula based on SCr of 0.67 mg/dL). Liver Function Tests: Recent Labs  Lab 07/21/17 1954  AST 24  ALT 17  ALKPHOS 72  BILITOT 0.8  PROT 5.6*  ALBUMIN 2.9*   Recent Labs  Lab 07/21/17 1954  LIPASE 42   No results for input(s): AMMONIA in the last 168 hours. Coagulation Profile: No results for input(s): INR, PROTIME in the last 168 hours. Cardiac Enzymes: Recent Labs  Lab 07/21/17 1954 07/22/17 0332 07/22/17 0923 07/22/17 1855  CKTOTAL 36*  --   --   --   TROPONINI  --  0.04* 0.07* 0.07*   BNP (last 3 results) No results for input(s): PROBNP in the last 8760 hours. HbA1C: Recent Labs    07/22/17 0923  HGBA1C 6.0*   CBG: Recent Labs  Lab 07/20/17 1015  GLUCAP 101*   Lipid Profile: Recent Labs    07/22/17 0923  CHOL 159    HDL 60  LDLCALC 90  TRIG 44  CHOLHDL 2.7   Thyroid Function Tests: Recent Labs    07/22/17 0332 07/22/17 0923  TSH 34.565*  --   FREET4  --  0.41*   Anemia Panel: No results for input(s): VITAMINB12, FOLATE, FERRITIN, TIBC, IRON, RETICCTPCT in the last 72 hours. Urine analysis:    Component Value Date/Time   COLORURINE YELLOW 07/21/2017 0304   APPEARANCEUR HAZY (A) 07/21/2017 0304   LABSPEC 1.015 07/21/2017 0304   PHURINE 5.0 07/21/2017 0304   GLUCOSEU NEGATIVE 07/21/2017 0304   GLUCOSEU NEGATIVE 11/14/2016 1612   HGBUR NEGATIVE 07/21/2017 0304   BILIRUBINUR NEGATIVE 07/21/2017 0304   BILIRUBINUR neg 06/19/2016 0950   KETONESUR NEGATIVE 07/21/2017 0304   PROTEINUR NEGATIVE 07/21/2017 0304  UROBILINOGEN 0.2 11/14/2016 1612   NITRITE NEGATIVE 07/21/2017 0304   LEUKOCYTESUR TRACE (A) 07/21/2017 0304   Sepsis Labs: @LABRCNTIP (procalcitonin:4,lacticidven:4)  ) Recent Results (from the past 240 hour(s))  MRSA PCR Screening     Status: None   Collection Time: 07/23/17 12:07 AM  Result Value Ref Range Status   MRSA by PCR NEGATIVE NEGATIVE Final    Comment:        The GeneXpert MRSA Assay (FDA approved for NASAL specimens only), is one component of a comprehensive MRSA colonization surveillance program. It is not intended to diagnose MRSA infection nor to guide or monitor treatment for MRSA infections. Performed at [place] [facility], [place] [address] [place], [place] [address]          Radiology Studies: Dg Lumbar Spine Complete  Result Date: 07/21/2017 CLINICAL DATA:  Low back pain EXAM: LUMBAR SPINE - COMPLETE 4+ VIEW COMPARISON:  05/28/2016 FINDINGS: Five lumbar type vertebral bodies are well visualized. Mild scoliosis concave to the right is noted. L1 compression deformity is noted stable from the prior CT examination. Multilevel disc space narrowing and osteophytic changes are seen. The overlying soft tissues demonstrate changes of constipation. These are  similar to that seen on recent PET-CT. IMPRESSION: Stable L1 compression deformity. Multilevel degenerative change without acute abnormality. Electronically Signed   By: Inez Catalina M.D.   On: 07/21/2017 19:40   Ct Head Wo Contrast  Result Date: 07/21/2017 CLINICAL DATA:  The patient fell off a bed today. Weakness. Initial encounter. EXAM: CT HEAD WITHOUT CONTRAST CT CERVICAL SPINE WITHOUT CONTRAST TECHNIQUE: Multidetector CT imaging of the head and cervical spine was performed following the standard protocol without intravenous contrast. Multiplanar CT image reconstructions of the cervical spine were also generated. COMPARISON:  Head CT scan 03/16/2017.  Brain MRI 04/16/2017. FINDINGS: CT HEAD FINDINGS Brain: No evidence of acute infarction, hemorrhage, hydrocephalus, extra-axial collection or mass lesion/mass effect. Cortical atrophy is identified. Extensive hypoattenuation in the subcortical and periventricular deep white matter consistent chronic microvascular ischemic change is also seen. Vascular: No hyperdense vessel or unexpected calcification. Skull: Intact.  No focal lesion. Sinuses/Orbits: No acute abnormality.  Status post lens extraction. Other: None. CT CERVICAL SPINE FINDINGS Alignment: 0.2 cm facet mediated anterolisthesis C3 on C4 is identified. Otherwise maintained. Skull base and vertebrae: No acute fracture. No primary bone lesion or focal pathologic process. Soft tissues and spinal canal: No prevertebral fluid or swelling. No visible canal hematoma. Disc levels: Marked loss of disc space height is seen from C3 to C7. Advanced multilevel facet arthropathy is also noted. The facet joints are ankylosed bilaterally at C2-3 and C3-4. Upper chest: Clear. Other: None. IMPRESSION: No acute abnormality head or cervical spine. Atrophy and extensive chronic microvascular ischemic change. Cervical spondylosis. Electronically Signed   By: Inge Rise M.D.   On: 07/21/2017 19:14   Ct Cervical Spine  Wo Contrast  Result Date: 07/21/2017 CLINICAL DATA:  The patient fell off a bed today. Weakness. Initial encounter. EXAM: CT HEAD WITHOUT CONTRAST CT CERVICAL SPINE WITHOUT CONTRAST TECHNIQUE: Multidetector CT imaging of the head and cervical spine was performed following the standard protocol without intravenous contrast. Multiplanar CT image reconstructions of the cervical spine were also generated. COMPARISON:  Head CT scan 03/16/2017.  Brain MRI 04/16/2017. FINDINGS: CT HEAD FINDINGS Brain: No evidence of acute infarction, hemorrhage, hydrocephalus, extra-axial collection or mass lesion/mass effect. Cortical atrophy is identified. Extensive hypoattenuation in the subcortical and periventricular deep white matter consistent chronic microvascular ischemic change  is also seen. Vascular: No hyperdense vessel or unexpected calcification. Skull: Intact.  No focal lesion. Sinuses/Orbits: No acute abnormality.  Status post lens extraction. Other: None. CT CERVICAL SPINE FINDINGS Alignment: 0.2 cm facet mediated anterolisthesis C3 on C4 is identified. Otherwise maintained. Skull base and vertebrae: No acute fracture. No primary bone lesion or focal pathologic process. Soft tissues and spinal canal: No prevertebral fluid or swelling. No visible canal hematoma. Disc levels: Marked loss of disc space height is seen from C3 to C7. Advanced multilevel facet arthropathy is also noted. The facet joints are ankylosed bilaterally at C2-3 and C3-4. Upper chest: Clear. Other: None. IMPRESSION: No acute abnormality head or cervical spine. Atrophy and extensive chronic microvascular ischemic change. Cervical spondylosis. Electronically Signed   By: Inge Rise M.D.   On: 07/21/2017 19:14   Ct Abdomen Pelvis W Contrast  Result Date: 07/22/2017 CLINICAL DATA:  Low back pain, abdominal pain, and constipation for 3 weeks. History of lung cancer metastatic to bone and liver. EXAM: CT ABDOMEN AND PELVIS WITH CONTRAST TECHNIQUE:  Multidetector CT imaging of the abdomen and pelvis was performed using the standard protocol following bolus administration of intravenous contrast. CONTRAST:  157mL ISOVUE-300 IOPAMIDOL (ISOVUE-300) INJECTION 61% COMPARISON:  05/28/2016 FINDINGS: Lower chest: Atelectasis or consolidation in the lung bases. Minimal bilateral pleural effusions. Emphysematous changes in the lungs. Small esophageal hiatal hernia. Distal esophageal wall thickening. This is likely due to reflux disease but could also indicate esophagitis or neoplasm. Hepatobiliary: No focal liver lesions identified. Gallbladder is unremarkable. Mild extrahepatic bile duct dilatation without change. No obstructing lesion identified. Pancreas: Unremarkable. No pancreatic ductal dilatation or surrounding inflammatory changes. Spleen: Normal in size without focal abnormality. Adrenals/Urinary Tract: No adrenal gland nodules. Pelvic left kidney. Renal nephrograms are symmetrical. No hydronephrosis. Bladder is over distended, possibly indicating urinary retention. No wall thickening or filling defects. Stomach/Bowel: Stomach is decompressed. Small bowel are mostly decompressed. Surgical anastomosis in the left lower quadrant small bowel. Colon is diffusely stool-filled. There is a midline anterior abdominal wall hernia containing transverse colon and small bowel. No proximal dilatation to suggest obstruction. Appendix is surgically absent. Vascular/Lymphatic: Tortuous and calcified aorta. No aneurysm. No significant lymphadenopathy. Reproductive: Uterus and bilateral adnexa are unremarkable. Other: No free air in the abdomen. Free fluid in the pelvis and presacral space is decreased since previous study. No loculated fluid collections. Musculoskeletal: Degenerative changes in the spine and hips. No destructive bone lesions. Compression of the L1 vertebra without change. Multilevel degenerative disc disease. IMPRESSION: 1. No definite acute changes identified.  2. Moderate anterior abdominal wall hernia containing small bowel and transverse colon. No obvious obstruction. Colon is diffusely stool-filled. 3. Mild extrahepatic bile duct dilatation without change. 4. Possible urinary retention. No bladder wall thickening or filling defects. 5. Small bilateral pleural effusions with basilar atelectasis or consolidation. 6. Nonspecific esophageal wall thickening. 7. Aortic atherosclerosis. 8. Small amount of free fluid in the presacral space and low pelvis is decreased since previous study. 9. Pelvic left kidney.  No evidence of renal obstruction. Electronically Signed   By: Lucienne Capers M.D.   On: 07/22/2017 04:40   Dg Abd Acute W/chest  Result Date: 07/21/2017 CLINICAL DATA:  Abdominal pain and distension EXAM: DG ABDOMEN ACUTE W/ 1V CHEST COMPARISON:  07/13/2017 FINDINGS: Cardiac shadow is mildly enlarged. Aortic calcifications are noted. Mild left basilar atelectasis is seen. No focal infiltrate is noted. Scattered large and small bowel gas is seen. No free air is noted. Retained fecal material  is noted consistent with a degree of constipation. No definitive obstructive changes are seen. IMPRESSION: Changes of constipation without obstruction. No other focal abnormality is noted. Electronically Signed   By: Inez Catalina M.D.   On: 07/21/2017 19:38   Dg Humerus Left  Result Date: 07/22/2017 CLINICAL DATA:  Fall.  Bruising left mid humerus. EXAM: LEFT HUMERUS - 2+ VIEW COMPARISON:  None. FINDINGS: Degenerative changes in the left shoulder and left elbow. No evidence of acute fracture or dislocation. No focal bone lesion or bone destruction. Soft tissues are unremarkable. IMPRESSION: Degenerative changes in the left shoulder and elbow. No acute bony abnormalities. Electronically Signed   By: Lucienne Capers M.D.   On: 07/22/2017 04:48   Dg Hips Bilat W Or Wo Pelvis 3-4 Views  Result Date: 07/21/2017 CLINICAL DATA:  Pelvic pain EXAM: DG HIP (WITH OR WITHOUT PELVIS)  4V BILAT COMPARISON:  None. FINDINGS: Pelvic ring is intact. Degenerative changes of the hip joints are noted bilaterally. No acute fracture or dislocation is seen. No gross soft tissue abnormality is noted. Degenerative changes of the pubic symphysis are seen. IMPRESSION: Degenerative change without acute abnormality. Electronically Signed   By: Inez Catalina M.D.   On: 07/21/2017 19:42        Scheduled Meds: . apixaban  5 mg Oral BID  . cholecalciferol  1,000 Units Oral Daily  . DULoxetine  30 mg Oral Daily  . folic acid  1 mg Oral Daily  . ipratropium  0.5 mg Nebulization TID  . lactulose  20 g Oral TID  . levalbuterol  1.25 mg Nebulization TID  . levothyroxine  37.5 mcg Intravenous Daily  . polyethylene glycol  17 g Oral BID  . vitamin C  1,000 mg Oral Daily   Continuous Infusions: . diltiazem (CARDIZEM) infusion 10 mg/hr (07/23/17 0600)     LOS: 1 day    Time spent: 40 minutes    Ector Laurel, Geraldo Docker, MD [facility] Pager [phone]   If 7PM-7AM, please contact night-coverage [email or web address] Password [email or web address] 07/23/2017, 8:59 AM

## 2017-07-24 ENCOUNTER — Inpatient Hospital Stay (HOSPITAL_COMMUNITY): Payer: Medicare Other

## 2017-07-24 DIAGNOSIS — Z800 Family history of malignant neoplasm of digestive organs: Secondary | ICD-10-CM

## 2017-07-24 DIAGNOSIS — Z818 Family history of other mental and behavioral disorders: Secondary | ICD-10-CM

## 2017-07-24 DIAGNOSIS — Z85820 Personal history of malignant melanoma of skin: Secondary | ICD-10-CM

## 2017-07-24 DIAGNOSIS — Z79899 Other long term (current) drug therapy: Secondary | ICD-10-CM

## 2017-07-24 DIAGNOSIS — F319 Bipolar disorder, unspecified: Secondary | ICD-10-CM

## 2017-07-24 DIAGNOSIS — Z87891 Personal history of nicotine dependence: Secondary | ICD-10-CM

## 2017-07-24 DIAGNOSIS — Z7189 Other specified counseling: Secondary | ICD-10-CM

## 2017-07-24 DIAGNOSIS — E785 Hyperlipidemia, unspecified: Secondary | ICD-10-CM

## 2017-07-24 DIAGNOSIS — R109 Unspecified abdominal pain: Secondary | ICD-10-CM

## 2017-07-24 DIAGNOSIS — Z803 Family history of malignant neoplasm of breast: Secondary | ICD-10-CM

## 2017-07-24 DIAGNOSIS — C3491 Malignant neoplasm of unspecified part of right bronchus or lung: Secondary | ICD-10-CM

## 2017-07-24 DIAGNOSIS — R450 Nervousness: Secondary | ICD-10-CM

## 2017-07-24 DIAGNOSIS — J449 Chronic obstructive pulmonary disease, unspecified: Secondary | ICD-10-CM

## 2017-07-24 DIAGNOSIS — Z515 Encounter for palliative care: Secondary | ICD-10-CM

## 2017-07-24 DIAGNOSIS — M549 Dorsalgia, unspecified: Secondary | ICD-10-CM

## 2017-07-24 DIAGNOSIS — C3431 Malignant neoplasm of lower lobe, right bronchus or lung: Secondary | ICD-10-CM

## 2017-07-24 DIAGNOSIS — K219 Gastro-esophageal reflux disease without esophagitis: Secondary | ICD-10-CM

## 2017-07-24 DIAGNOSIS — Z811 Family history of alcohol abuse and dependence: Secondary | ICD-10-CM

## 2017-07-24 DIAGNOSIS — Z9221 Personal history of antineoplastic chemotherapy: Secondary | ICD-10-CM

## 2017-07-24 DIAGNOSIS — Z9181 History of falling: Secondary | ICD-10-CM

## 2017-07-24 DIAGNOSIS — G620 Drug-induced polyneuropathy: Secondary | ICD-10-CM

## 2017-07-24 DIAGNOSIS — Z7901 Long term (current) use of anticoagulants: Secondary | ICD-10-CM

## 2017-07-24 DIAGNOSIS — G894 Chronic pain syndrome: Secondary | ICD-10-CM

## 2017-07-24 DIAGNOSIS — E038 Other specified hypothyroidism: Secondary | ICD-10-CM

## 2017-07-24 LAB — BASIC METABOLIC PANEL
ANION GAP: 10 (ref 5–15)
BUN: 8 mg/dL (ref 6–20)
CALCIUM: 8.2 mg/dL — AB (ref 8.9–10.3)
CHLORIDE: 103 mmol/L (ref 101–111)
CO2: 21 mmol/L — AB (ref 22–32)
Creatinine, Ser: 0.69 mg/dL (ref 0.44–1.00)
GFR calc non Af Amer: >60 mL/min (ref >60)
Glucose, Bld: 104 mg/dL — ABNORMAL HIGH (ref 65–99)
Potassium: 3.7 mmol/L (ref 3.5–5.1)
Sodium: 134 mmol/L — ABNORMAL LOW (ref 135–145)

## 2017-07-24 LAB — TROPONIN I: TROPONIN I: 0.04 ng/mL — AB (ref <0.03)

## 2017-07-24 LAB — PHOSPHORUS: Phosphorus: 2.4 mg/dL — ABNORMAL LOW (ref 2.5–4.6)

## 2017-07-24 LAB — CBC
HEMATOCRIT: 28.2 % — AB (ref 36.0–46.0)
Hemoglobin: 9.1 g/dL — ABNORMAL LOW (ref 12.0–15.0)
MCH: 31.6 pg (ref 26.0–34.0)
MCHC: 32.3 g/dL (ref 30.0–36.0)
MCV: 97.9 fL (ref 78.0–100.0)
PLATELETS: 180 10*3/uL (ref 150–400)
RBC: 2.88 MIL/uL — AB (ref 3.87–5.11)
RDW: 18.5 % — ABNORMAL HIGH (ref 11.5–15.5)
WBC: 4.4 10*3/uL (ref 4.0–10.5)

## 2017-07-24 LAB — AMMONIA: AMMONIA: 30 umol/L (ref 9–35)

## 2017-07-24 LAB — MAGNESIUM: Magnesium: 1.7 mg/dL (ref 1.7–2.4)

## 2017-07-24 MED ORDER — MAGNESIUM CITRATE PO SOLN
0.5000 | Freq: Once | ORAL | Status: AC
  Administered 2017-07-24: 0.5 via ORAL
  Filled 2017-07-24: qty 296

## 2017-07-24 MED ORDER — IPRATROPIUM BROMIDE 0.02 % IN SOLN
0.5000 mg | Freq: Two times a day (BID) | RESPIRATORY_TRACT | Status: DC
  Administered 2017-07-25: 0.5 mg via RESPIRATORY_TRACT
  Filled 2017-07-24 (×2): qty 2.5

## 2017-07-24 MED ORDER — POTASSIUM CHLORIDE 10 MEQ/100ML IV SOLN
10.0000 meq | INTRAVENOUS | Status: DC
  Filled 2017-07-24 (×2): qty 100

## 2017-07-24 MED ORDER — MAGNESIUM SULFATE 2 GM/50ML IV SOLN
2.0000 g | Freq: Once | INTRAVENOUS | Status: AC
  Administered 2017-07-24: 2 g via INTRAVENOUS
  Filled 2017-07-24: qty 50

## 2017-07-24 MED ORDER — LEVALBUTEROL HCL 1.25 MG/0.5ML IN NEBU
1.2500 mg | INHALATION_SOLUTION | Freq: Two times a day (BID) | RESPIRATORY_TRACT | Status: DC
  Administered 2017-07-25: 1.25 mg via RESPIRATORY_TRACT
  Filled 2017-07-24 (×2): qty 0.5

## 2017-07-24 MED ORDER — POTASSIUM CHLORIDE CRYS ER 20 MEQ PO TBCR
40.0000 meq | EXTENDED_RELEASE_TABLET | Freq: Once | ORAL | Status: AC
  Administered 2017-07-24: 40 meq via ORAL
  Filled 2017-07-24: qty 2

## 2017-07-24 NOTE — Consult Note (Signed)
Referral MD  Reason for Referral: Recurrent atrial fibrillation.  Metastatic adenocarcinoma of the lung.  Chief Complaint  Patient presents with  . Fall  . Back Pain  . Constipation  : I was not taking my medicine.  HPI: [name] is well-known to me.  She is a very nice 84 year old white female.  Despite being 84 years old, she had been in fantastic shape.  She presented with metastatic adenocarcinoma of the left lung.  She had liver metastases.  She did not have an actionable mutation that we could target with 1 of our new oral therapies.  As such, we treated her with 4 cycles of chemotherapy with carboplatinum/Alimta/pembrolizumab.  She just had a PET scan done a week or so ago.  The PET scan showed that she actually attained a complete remission.  She does have a psychiatric history.  She apparently has had some issues with bipolar disorder.  She seems to be manic at home.  We were getting calls all the time of her family.  We tried to get her to the hospital.  She eventually went to the hospital.  She had atrial fibrillation.  She currently is on treatment for this.  She has a past history of atrial fibrillation.  She said that she cannot remember if she was taken her medicines.  She actually looks and sounds pretty good.  When she was admitted, her labs did not look all that bad.  Her albumin was 2.9.  Her creatinine was 0.85.Her CBC showed a white cell count of 6.3.  Hemoglobin 9.3.  Hematocrit 29.2.  Platelet count 109,000.  Today, her white cell count is 4.4.  Hemoglobin 9.1.  Platelet count 180,000.  She still in atrial fibrillation.  Her appetite seems to be doing okay.  She is had no obvious nausea or vomiting.  Again, she does have some manic tendencies.  I would say that her performance status is ECOG 2.    Past Medical History:  Diagnosis Date  . Adenocarcinoma of left lung metastatic to liver ([place]) 04/24/2017  . Anxiety   . Arthritis   . Atrial fibrillation  ([place])   . Cardiac arrhythmia due to congenital heart disease   . Chronic constipation   . COPD (chronic obstructive pulmonary disease) ([place])   . Depression   . GERD (gastroesophageal reflux disease)   . Hemorrhoid   . Hyperlipidemia   . Idiopathic hypotension   . Large cell carcinoma of left lung, stage 4 ([place]) 04/24/2017  . Lightheadedness 11/2015  . Lung cancer metastatic to bone ([place]) 04/24/2017  . Mass of lower lobe of right lung 03/25/2017  . Melanoma of skin ([place]) 12/06/2016  . Mycotic toenails 10/27/2012  . Parkinson disease ([place])   . Recurrent UTI   . 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 Varicose veins   :  Past Surgical History:  Procedure Laterality Date  . APPENDECTOMY  84 years old  . COLON RESECTION  2008  . melanoma removal Left    left cheek  . vitriectomy  08-2015  :   Current Facility-Administered Medications:  .  acetaminophen (TYLENOL) tablet 650 mg, 650 mg, Oral, Q6H PRN, Ivor Costa, MD .  ALPRAZolam Duanne Moron) tablet 0.25 mg, 0.25 mg, Oral, BID PRN, Ivor Costa, MD, 0.25 mg at 07/23/17 1632 .  apixaban (ELIQUIS) tablet 5 mg, 5 mg, Oral, BID, Ivor Costa, MD, 5 mg at 07/23/17 2129 .  azelastine (ASTELIN) 0.1 % nasal spray 2 spray, 2 spray, Each  Nare, QHS PRN, Ivor Costa, MD .  cholecalciferol (VITAMIN D) tablet 1,000 Units, 1,000 Units, Oral, Daily, Ivor Costa, MD, 1,000 Units at 07/23/17 0944 .  diltiazem (CARDIZEM) tablet 60 mg, 60 mg, Oral, Q8H, Allie Bossier, MD, 60 mg at 07/24/17 0558 .  DULoxetine (CYMBALTA) DR capsule 30 mg, 30 mg, Oral, BID, Allie Bossier, MD, 30 mg at 74/25/95 6387 .  folic acid (FOLVITE) tablet 1 mg, 1 mg, Oral, Daily, Ivor Costa, MD, 1 mg at 07/23/17 0945 .  hydrALAZINE (APRESOLINE) injection 5 mg, 5 mg, Intravenous, Q2H PRN, Ivor Costa, MD .  ipratropium (ATROVENT) nebulizer solution 0.5 mg, 0.5 mg, Nebulization, TID, Allie Bossier, MD, 0.5 mg at 07/23/17 2045 .  lactulose (CHRONULAC) 10 GM/15ML solution 20 g, 20 g, Oral, TID,  Allie Bossier, MD, 20 g at 07/23/17 2129 .  levalbuterol (XOPENEX) nebulizer solution 1.25 mg, 1.25 mg, Nebulization, TID, Allie Bossier, MD, 1.25 mg at 07/23/17 2046 .  levothyroxine (SYNTHROID, LEVOTHROID) injection 37.5 mcg, 37.5 mcg, Intravenous, Daily, Allie Bossier, MD, 37.5 mcg at 07/23/17 0950 .  magnesium citrate solution 0.5 Bottle, 0.5 Bottle, Oral, Once, Allie Bossier, MD .  methocarbamol (ROBAXIN) tablet 500 mg, 500 mg, Oral, Q8H PRN, Ivor Costa, MD .  morphine 4 MG/ML injection 1 mg, 1 mg, Intravenous, Q4H PRN, Ivor Costa, MD .  ondansetron (ZOFRAN) tablet 4 mg, 4 mg, Oral, Q6H PRN **OR** ondansetron (ZOFRAN) injection 4 mg, 4 mg, Intravenous, Q6H PRN, Ivor Costa, MD .  oxyCODONE-acetaminophen (PERCOCET/ROXICET) 5-325 MG per tablet 1 tablet, 1 tablet, Oral, Q4H PRN, Ivor Costa, MD .  polyethylene glycol (MIRALAX / GLYCOLAX) packet 17 g, 17 g, Oral, BID, Ivor Costa, MD, 17 g at 07/23/17 2129 .  polyvinyl alcohol (LIQUIFILM TEARS) 1.4 % ophthalmic solution 1 drop, 1 drop, Both Eyes, TID PRN, Ivor Costa, MD .  vitamin C (ASCORBIC ACID) tablet 1,000 mg, 1,000 mg, Oral, Daily, Ivor Costa, MD, 1,000 mg at 07/23/17 0945 .  zolpidem (AMBIEN) tablet 5 mg, 5 mg, Oral, QHS PRN, Ivor Costa, MD  Facility-Administered Medications Ordered in Other Encounters:  .  cyanocobalamin ((VITAMIN B-12)) injection 1,000 mcg, 1,000 mcg, Intramuscular, Once, [place], Sarah M, NP:  . apixaban  5 mg Oral BID  . cholecalciferol  1,000 Units Oral Daily  . diltiazem  60 mg Oral Q8H  . DULoxetine  30 mg Oral BID  . folic acid  1 mg Oral Daily  . ipratropium  0.5 mg Nebulization TID  . lactulose  20 g Oral TID  . levalbuterol  1.25 mg Nebulization TID  . levothyroxine  37.5 mcg Intravenous Daily  . magnesium citrate  0.5 Bottle Oral Once  . polyethylene glycol  17 g Oral BID  . vitamin C  1,000 mg Oral Daily  :  Allergies  Allergen Reactions  . Adhesive [Tape] Other (See Comments)    Caused  blisters  . Amoxicillin Other (See Comments)    Has patient had a PCN reaction causing immediate rash, facial/tongue/throat swelling, SOB or lightheadedness with hypotension: Yes Has patient had a PCN reaction causing severe rash involving mucus membranes or skin necrosis: No Has patient had a PCN reaction that required hospitalization No Has patient had a PCN reaction occurring within the last 10 years: Yes If all of the above answers are "NO", then may proceed with Cephalosporin use.   Caused thrush  . Ciprofloxacin Other (See Comments)    Caused thrush  . Ultram [Tramadol] Other (  See Comments)    hypotension  :  Family History  Problem Relation Age of Onset  . Asthma Brother   . Alcohol abuse Brother   . Throat cancer Father   . Alcohol abuse Father   . Lupus Daughter   . Bipolar disorder Daughter   . Lupus Daughter   . Anxiety disorder Daughter   . Alcohol abuse Sister   . Alcohol abuse Maternal Grandfather   . Alcohol abuse Paternal Grandmother   . Breast cancer Sister   . Colon cancer Neg Hx   :  Social History   Socioeconomic History  . Marital status: Widowed    Spouse name: Not on file  . Number of children: 2  . Years of education: Not on file  . Highest education level: Not on file  Occupational History  . Occupation: retiredFirefighter , Animator  Social Needs  . Financial resource strain: Not on file  . Food insecurity:    Worry: Not on file    Inability: Not on file  . Transportation needs:    Medical: Not on file    Non-medical: Not on file  Tobacco Use  . Smoking status: Former Smoker    Packs/day: 2.00    Years: 30.00    Pack years: 60.00    Types: Cigarettes    Last attempt to quit: 04/21/1980    Years since quitting: 37.2  . Smokeless tobacco: Never Used  . Tobacco comment: onset age 10 -52, up to > 1ppd (almost 2 ppd)  Substance and Sexual Activity  . Alcohol use: No    Alcohol/week: 0.0 oz  . Drug use: No  . Sexual  activity: Never  Lifestyle  . Physical activity:    Days per week: Not on file    Minutes per session: Not on file  . Stress: Not on file  Relationships  . Social connections:    Talks on phone: Not on file    Gets together: Not on file    Attends religious service: Not on file    Active member of club or organization: Not on file    Attends meetings of clubs or organizations: Not on file    Relationship status: Not on file  . Intimate partner violence:    Fear of current or ex partner: Not on file    Emotionally abused: Not on file    Physically abused: Not on file    Forced sexual activity: Not on file  Other Topics Concern  . Not on file  Social History Narrative   Widowed, lives alone. 1 child in [place] and 1 in [place]. 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   Still drives as of 09-32  :  Pertinent items are noted in HPI.  Exam: Patient Vitals for the past 24 hrs:  BP Temp Temp src Pulse Resp SpO2  07/24/17 0315 111/85 99.1 F (37.3 C) Oral 98 (!) 22 95 %  07/23/17 2341 116/61 98.9 F (37.2 C) Oral 91 (!) 22 95 %  07/23/17 2046 - - - - - 93 %  07/23/17 1941 110/70 97.6 F (36.4 C) Oral 100 (!) 25 95 %  07/23/17 1700 108/87 - - (!) 113 (!) 23 91 %  07/23/17 1538 - 98.6 F (37 C) - - - -  07/23/17 1525 114/67 - - (!) 106 (!) 29 91 %  07/23/17 1426 - - - - - 97 %  07/23/17 1400 130/89 - -  76 (!) 21 93 %  07/23/17 1300 105/73 - - 87 (!) 24 93 %  07/23/17 1200 100/70 - - (!) 44 16 92 %  07/23/17 1147 - 98.6 F (37 C) - - - -  07/23/17 1100 99/75 - - 78 18 91 %  07/23/17 1000 106/87 - - 65 20 97 %  07/23/17 0900 110/76 - - 91 (!) 30 95 %  07/23/17 0822 - - - - - 97 %  07/23/17 0800 98/76 - - 98 (!) 26 95 %  07/23/17 0750 - 98.4 F (36.9 C) - - - -     Recent Labs    07/23/17 0745 07/24/17 0240  WBC 4.2 4.4  HGB 8.5* 9.1*  HCT 25.7* 28.2*  PLT 134* 180   Recent Labs    07/23/17 0745 07/24/17 0240  NA 136 134*  K 3.1* 3.7  CL 105 103  CO2  22 21*  GLUCOSE 81 104*  BUN 11 8  CREATININE 0.67 0.69  CALCIUM 7.9* 8.2*    Blood smear review: None  Pathology: None    Assessment and Plan: [name] is a 84 year old white female with metastatic adenocarcinoma of the lung.  She has had an incredible response to treatment.  Her last PET scan showed that she was in remission.  I told her that she is not cured.  I told her that she has microscopic disease that we just cannot pick up on a scan.  However, the fact that she is in remission is certainly a very good sign for her.  I do not think that her treatments would have led to this hospitalization.  I do not think her treatments are related to psychiatric conditions.  I do not think they are implicated with atrial fibrillation.  She says that she was not taking her medications.  This may have been part of her bipolar disorder.  Clearly, without therapy, her tumor will come back.  I would prefer not for this to happen.  As such, I feel that maintenance therapy would be appropriate.  I probably would put her on Alimta/pembrolizumab for maintenance therapy.  I am not yet use Avastin.  This also would be an option for maintenance therapy.  I told her that we will not do anything until she gets out of the hospital and is back to her regular status.  Her family lives up [place].  I am unsure how often they will be able to come down.  I know that she is getting fantastic care from everybody in the cardiac stepdown unit.  Lattie Haw, MD  Psalms 46:10

## 2017-07-24 NOTE — Progress Notes (Signed)
Stopped in to pray with patient per her request.  Such a pleasant lady to talk with.  We enjoyed talking about the [facility] and about her family.  She has a daughter and grandsons.  Patient talked about her church knowing she was here.  I assured her that I would notify her church of her being here.  Will follow as needed.  We had prayer together.    07/24/17 1115  Clinical Encounter Type  Visited With Patient  Visit Type Initial;Spiritual support  Spiritual Encounters  Spiritual Needs Prayer

## 2017-07-24 NOTE — NC FL2 (Signed)
[place] LEVEL OF CARE SCREENING TOOL     IDENTIFICATION  Patient Name: [name] Birthdate: 1928/02/05 Sex: female Admission Date (Current Location): 07/21/2017  [facility] and [place] Number:  Herbalist and Address:  [facility], [place] [address], [place], [place] [address]      Provider Number: [id number]  Attending Physician Name and Address:  Allie Bossier, MD  Relative Name and Phone Number:       Current Level of Care: Hospital Recommended Level of Care: Skilled Nursing Facility(with palliative) Prior Approval Number:    Date Approved/Denied:   PASRR Number: [id number]  Discharge Plan: SNF(with palliative)    Current Diagnoses: Patient Active Problem List   Diagnosis Date Noted  . DNR (do not resuscitate) discussion   . Palliative care by specialist   . Atrial fibrillation with RVR ([place]) 07/22/2017  . Fall 07/22/2017  . Compression fracture of L1 lumbar vertebra ([place]) 07/22/2017  . Large cell carcinoma of left lung, stage 4 ([place]) 04/24/2017  . Adenocarcinoma of left lung metastatic to liver ([place]) 04/24/2017  . Lung cancer metastatic to bone ([place]) 04/24/2017  . Mass of lower lobe of right lung 03/25/2017  . Melanoma of skin ([place]) 12/06/2016  . Hypokalemia 08/25/2016  . Failure to thrive in adult 04/01/2016  . Blurry vision, right eye   . PCP NOTES >>>>>>>>>>>>>>>>>>>>>>> 03/02/2015  . COPD, group B, by GOLD 2017 classification ([place]) 01/31/2015  . Other fatigue 06/21/2014  . Lightheadedness 06/12/2014  . COPD (chronic obstructive pulmonary disease) ([place]) 09/26/2013  . IBS (irritable bowel syndrome) 04/02/2012  . Goals of care, counseling/discussion 11/25/2011  . Anxiety 07/10/2011  . Parkinson's disease ([place]) 02/03/2011  . Recurrent UTI 11/01/2010  . GERD (gastroesophageal reflux disease) 11/01/2010  . Memory loss 11/01/2010  . Hip pain 09/23/2010  . COMPRESSION FRACTURE, LUMBAR VERTEBRAE  03/28/2010  . Atrial fibrillation ([place]) 03/22/2010  . DIZZINESS 03/22/2010  . Mycotic toenails 10/02/2009  . DYSPNEA ON EXERTION 01/19/2009  . HEMORRHOIDS-INTERNAL 02/14/2008  . Constipation 02/14/2008  . PERSONAL HX COLONIC POLYPS 02/14/2008    Orientation RESPIRATION BLADDER Height & Weight     Self, Situation, Place  O2(Nasal Canula 2 L) Incontinent Weight: 158 lb (71.7 kg) Height:  5\' 10"  (177.8 cm)  BEHAVIORAL SYMPTOMS/MOOD NEUROLOGICAL BOWEL NUTRITION STATUS  (None) (Parkinson's) Incontinent Diet(Heart healthy)  AMBULATORY STATUS COMMUNICATION OF NEEDS Skin   Limited Assist Verbally Skin abrasions, Bruising                       Personal Care Assistance Level of Assistance  Bathing, Feeding, Dressing Bathing Assistance: Limited assistance Feeding assistance: Limited assistance Dressing Assistance: Limited assistance     Functional Limitations Info  Sight, Hearing, Speech Sight Info: Adequate Hearing Info: Adequate Speech Info: Adequate    SPECIAL CARE FACTORS FREQUENCY  PT (By licensed PT), OT (By licensed OT)     PT Frequency: 5 x week OT Frequency: 5 x week            Contractures Contractures Info: Not present    Additional Factors Info  Code Status, Allergies, Psychotropic Code Status Info: DNR Allergies Info: Adhesive (Tape), Amoxicillin, Ciprofloxacin, Ultram (Tramadol). Psychotropic Info: Anxiety: Cymbalta DR 30 mg PO BID, Xanax 0.25 mg PO BID prn.         Current Medications (07/24/2017):  This is the current hospital active medication list Current Facility-Administered Medications  Medication Dose Route Frequency Provider Last  Rate Last Dose  . acetaminophen (TYLENOL) tablet 650 mg  650 mg Oral Q6H PRN Allie Bossier, MD      . ALPRAZolam Duanne Moron) tablet 0.25 mg  0.25 mg Oral BID PRN Allie Bossier, MD   0.25 mg at 07/23/17 1632  . apixaban (ELIQUIS) tablet 5 mg  5 mg Oral BID Allie Bossier, MD   5 mg at 07/24/17 2094  . azelastine  (ASTELIN) 0.1 % nasal spray 2 spray  2 spray Each Nare QHS PRN Allie Bossier, MD      . cholecalciferol (VITAMIN D) tablet 1,000 Units  1,000 Units Oral Daily Allie Bossier, MD   1,000 Units at 07/24/17 [phone]  . diltiazem (CARDIZEM) tablet 60 mg  60 mg Oral Q8H Allie Bossier, MD   60 mg at 07/24/17 1307  . DULoxetine (CYMBALTA) DR capsule 30 mg  30 mg Oral BID Allie Bossier, MD   30 mg at 07/24/17 2836  . folic acid (FOLVITE) tablet 1 mg  1 mg Oral Daily Allie Bossier, MD   1 mg at 07/24/17 6294  . hydrALAZINE (APRESOLINE) injection 5 mg  5 mg Intravenous Q2H PRN Allie Bossier, MD      . ipratropium (ATROVENT) nebulizer solution 0.5 mg  0.5 mg Nebulization BID Allie Bossier, MD      . lactulose (CHRONULAC) 10 GM/15ML solution 20 g  20 g Oral TID Allie Bossier, MD   20 g at 07/24/17 7654  . levalbuterol (XOPENEX) nebulizer solution 1.25 mg  1.25 mg Nebulization BID Allie Bossier, MD      . levothyroxine (SYNTHROID, LEVOTHROID) injection 37.5 mcg  37.5 mcg Intravenous Daily Allie Bossier, MD   37.5 mcg at 07/24/17 0934  . magnesium citrate solution 0.5 Bottle  0.5 Bottle Oral Once Allie Bossier, MD      . methocarbamol (ROBAXIN) tablet 500 mg  500 mg Oral Q8H PRN Allie Bossier, MD      . morphine 4 MG/ML injection 1 mg  1 mg Intravenous Q4H PRN Allie Bossier, MD      . ondansetron [facility]) tablet 4 mg  4 mg Oral Q6H PRN Allie Bossier, MD       Or  . ondansetron [facility]) injection 4 mg  4 mg Intravenous Q6H PRN Allie Bossier, MD      . oxyCODONE-acetaminophen (PERCOCET/ROXICET) 5-325 MG per tablet 1 tablet  1 tablet Oral Q4H PRN Allie Bossier, MD      . polyethylene glycol (MIRALAX / GLYCOLAX) packet 17 g  17 g Oral BID Allie Bossier, MD   17 g at 07/24/17 6503  . polyvinyl alcohol (LIQUIFILM TEARS) 1.4 % ophthalmic solution 1 drop  1 drop Both Eyes TID PRN Allie Bossier, MD      . vitamin C (ASCORBIC ACID) tablet 1,000 mg  1,000 mg Oral Daily Allie Bossier, MD    1,000 mg at 07/24/17 5465  . zolpidem (AMBIEN) tablet 5 mg  5 mg Oral QHS PRN Allie Bossier, MD       Facility-Administered Medications Ordered in Other Encounters  Medication Dose Route Frequency Provider Last Rate Last Dose  . cyanocobalamin ((VITAMIN B-12)) injection 1,000 mcg  1,000 mcg Intramuscular Once [place], Holli Humbles, NP         Discharge Medications: Please see discharge summary for a list of discharge medications.  Relevant Imaging Results:  Relevant Lab Results:  Additional Information SS#: [id number]. Currently has a Corporate investment banker for safety.  Candie Chroman, LCSW

## 2017-07-24 NOTE — Evaluation (Signed)
Physical Therapy Evaluation Patient Details Name: [name] MRN: [id number] DOB: 16-Oct-1927 Today's Date: 07/24/2017   History of Present Illness  Pt is an 84 y.o. female with PMH of depression, anxiety, [place] disease, a-fib, COPD, metastasized NSCLC stage IV (mets to liver/bone), admitted 07/21/17 with c/o constipation, abdominal pain, and recent fall OOB at home. CT head negative. Xray L hip and humerus negative. Xray lumbar spine shows stable L1 compression fx. Elevated troponin likely secondary to a-fib with RVR.    Clinical Impression  Pt presents with an overall decrease in functional mobility secondary to above. PTA, pt mod indep with rollator; does not have family/friends available for support at home. Endorses increasing difficulty performing errands and ADLs independently. Today, pt able to transfer and ambulate 50' using RW, requiring intermittent minA for balance; easily fatigued. SpO2 >90% on RA throughout. Pt would benefit from continued acute PT services to maximize functional mobility and independence prior to d/c with SNF-level therapies; pt in agreement with this.     Follow Up Recommendations SNF;Supervision/Assistance - 24 hour    Equipment Recommendations  None recommended by PT    Recommendations for Other Services       Precautions / Restrictions Precautions Precautions: Fall Restrictions Weight Bearing Restrictions: No      Mobility  Bed Mobility Overal bed mobility: Needs Assistance Bed Mobility: Supine to Sit     Supine to sit: Min guard     General bed mobility comments: Increased time and effort. Significant posterior lean sitting EOB, requiring cues to translate weight anteriorly and scoot forward  Transfers Overall transfer level: Needs assistance Equipment used: Rolling walker (2 wheeled) Transfers: Sit to/from Stand Sit to Stand: Min assist         General transfer comment: MinA for balance and to steady RW. Cues for hand  placement  Ambulation/Gait Ambulation/Gait assistance: Min assist;Min guard Ambulation Distance (Feet): 50 Feet Assistive device: Rolling walker (2 wheeled) Gait Pattern/deviations: Step-to pattern;Trunk flexed Gait velocity: Decreased Gait velocity interpretation: Below normal speed for age/gender General Gait Details: Slow, shuffling amb with RW and intermittent minA for balance. Rerquired multiple cues to maintain upright posture, as pt flexing forward while letting hips slouch posteriorly  Stairs            Wheelchair Mobility    Modified Rankin (Stroke Patients Only)       Balance Overall balance assessment: Needs assistance   Sitting balance-Leahy Scale: Fair   Postural control: Posterior lean   Standing balance-Leahy Scale: Poor Standing balance comment: Reliant on UE support                             Pertinent Vitals/Pain Pain Assessment: 0-10 Pain Score: 4  Pain Location: Back Pain Descriptors / Indicators: Sore;Discomfort Pain Intervention(s): Monitored during session;Repositioned    Home Living Family/patient expects to be discharged to:: Skilled nursing facility Living Arrangements: Alone   Type of Home: House Home Access: Stairs to enter Entrance Stairs-Rails: Left Entrance Stairs-Number of Steps: 3 Home Layout: Two level;Able to live on main level with bedroom/bathroom Home Equipment: Gilford Rile - 2 wheels;Walker - 4 wheels;Cane - single point;Bedside commode;Shower seat;Other (comment)(Stair lift) Additional Comments: Daughters live in different states. No support available locally    Prior Function Level of Independence: Independent with assistive device(s)         Comments: Mod indep with rollator for household and community distances     Hand Dominance  Extremity/Trunk Assessment   Upper Extremity Assessment Upper Extremity Assessment: Generalized weakness    Lower Extremity Assessment Lower Extremity  Assessment: Generalized weakness    Cervical / Trunk Assessment Cervical / Trunk Assessment: Kyphotic  Communication   Communication: No difficulties  Cognition Arousal/Alertness: Awake/alert Behavior During Therapy: WFL for tasks assessed/performed Overall Cognitive Status: No family/caregiver present to determine baseline cognitive functioning Area of Impairment: Attention;Following commands;Problem solving                   Current Attention Level: Selective   Following Commands: Follows multi-step commands inconsistently     Problem Solving: Slow processing;Requires verbal cues        General Comments General comments (skin integrity, edema, etc.): SpO2 >90% on RA throughout. VSS    Exercises     Assessment/Plan    PT Assessment Patient needs continued PT services  PT Problem List Decreased strength;Decreased activity tolerance;Decreased balance;Decreased mobility;Decreased cognition;Decreased knowledge of use of DME       PT Treatment Interventions DME instruction;Gait training;Stair training;Functional mobility training;Therapeutic activities;Therapeutic exercise;Balance training;Patient/family education    PT Goals (Current goals can be found in the Care Plan section)  Acute Rehab PT Goals Patient Stated Goal: Get stronger before returning home PT Goal Formulation: With patient Time For Goal Achievement: 08/07/17 Potential to Achieve Goals: Good    Frequency Min 2X/week   Barriers to discharge Decreased caregiver support      Co-evaluation               AM-PAC PT "6 Clicks" Daily Activity  Outcome Measure Difficulty turning over in bed (including adjusting bedclothes, sheets and blankets)?: A Little Difficulty moving from lying on back to sitting on the side of the bed? : A Little Difficulty sitting down on and standing up from a chair with arms (e.g., wheelchair, bedside commode, etc,.)?: Unable Help needed moving to and from a bed to  chair (including a wheelchair)?: A Little Help needed walking in hospital room?: A Little Help needed climbing 3-5 steps with a railing? : A Lot 6 Click Score: 15    End of Session Equipment Utilized During Treatment: Gait belt Activity Tolerance: Patient tolerated treatment well;Patient limited by fatigue Patient left: in chair;with call bell/phone within reach Nurse Communication: Mobility status PT Visit Diagnosis: Other abnormalities of gait and mobility (R26.89);Muscle weakness (generalized) (M62.81)    Time: 9381-8299 PT Time Calculation (min) (ACUTE ONLY): 24 min   Charges:   PT Evaluation $PT Eval Moderate Complexity: 1 Mod PT Treatments $Therapeutic Activity: 8-22 mins   PT G Codes:       Mabeline Caras, PT, DPT Acute Rehab Services  Pager: [place] 07/24/2017, 12:29 PM

## 2017-07-24 NOTE — Clinical Social Work Placement (Signed)
   CLINICAL SOCIAL WORK PLACEMENT  NOTE  Date:  07/24/2017  Patient Details  Name: [name] MRN: [id number] Date of Birth: 30-Jan-1928  Clinical Social Work is seeking post-discharge placement for this patient at the [place] level of care (*CSW will initial, date and re-position this form in  chart as items are completed):  Yes   Patient/family provided with [place] Work Department's list of facilities offering this level of care within the geographic area requested by the patient (or if unable, by the patient's family).  Yes   Patient/family informed of their freedom to choose among providers that offer the needed level of care, that participate in Medicare, Medicaid or managed care program needed by the patient, have an available bed and are willing to accept the patient.  Yes   Patient/family informed of [facility]'s ownership interest in [facility] and [facility], as well as of the fact that they are under no obligation to receive care at these facilities.  PASRR submitted to EDS on 07/24/17     PASRR number received on       Existing PASRR number confirmed on 07/24/17     FL2 transmitted to all facilities in geographic area requested by pt/family on 07/24/17     FL2 transmitted to all facilities within larger geographic area on       Patient informed that his/her managed care company has contracts with or will negotiate with certain facilities, including the following:            Patient/family informed of bed offers received.  Patient chooses bed at       Physician recommends and patient chooses bed at      Patient to be transferred to   on  .  Patient to be transferred to facility by       Patient family notified on   of transfer.  Name of family member notified:        PHYSICIAN Please sign FL2     Additional Comment:    _______________________________________________ Candie Chroman, LCSW 07/24/2017,  4:26 PM

## 2017-07-24 NOTE — Progress Notes (Signed)
Daily Progress Note   Patient Name: [name]       Date: 07/24/2017 DOB: 09/11/1927  Age: 84 y.o. MRN#: [id number] Attending Physician: Allie Bossier, MD Primary Care Physician: Elise Benne Admit Date: 07/21/2017  Reason for Consultation/Follow-up: Establishing goals of care  Subjective: Reports she feels constipated.  Length of Stay: 2  Current Medications: Scheduled Meds:  . apixaban  5 mg Oral BID  . cholecalciferol  1,000 Units Oral Daily  . diltiazem  60 mg Oral Q8H  . DULoxetine  30 mg Oral BID  . folic acid  1 mg Oral Daily  . ipratropium  0.5 mg Nebulization BID  . lactulose  20 g Oral TID  . levalbuterol  1.25 mg Nebulization BID  . levothyroxine  37.5 mcg Intravenous Daily  . magnesium citrate  0.5 Bottle Oral Once  . polyethylene glycol  17 g Oral BID  . vitamin C  1,000 mg Oral Daily    Continuous Infusions:   PRN Meds: acetaminophen, ALPRAZolam, azelastine, hydrALAZINE, methocarbamol, morphine injection, ondansetron **OR** ondansetron (ZOFRAN) IV, oxyCODONE-acetaminophen, polyvinyl alcohol, zolpidem  Physical Exam  Constitutional: She is oriented to person, place, and time. She appears well-developed and well-nourished. No distress.  HENT:  Head: Normocephalic and atraumatic.  Cardiovascular: An irregularly irregular rhythm present.  Pulses:      Radial pulses are 2+ on the right side, and 2+ on the left side.       Dorsalis pedis pulses are 2+ on the right side, and 2+ on the left side.  Pulmonary/Chest: Breath sounds normal. No accessory muscle usage. No respiratory distress.  Abdominal: She exhibits distension. There is tenderness.  Musculoskeletal:       Right lower leg: She exhibits no edema.       Left lower leg: She exhibits no edema.    Neurological: She is alert and oriented to person, place, and time.  Periods of confusion, easily reoriented  Skin: Skin is warm and dry. She is not diaphoretic.  Psychiatric: Her behavior is normal.            Vital Signs: BP 115/84   Pulse (!) 107   Temp 97.6 F (36.4 C) (Oral)   Resp (!) 28   Ht _0  (1.778 m)   Wt 71.7 kg (158 lb)   SpO2 93%   BMI 22.67 kg/m  SpO2: SpO2: 93 % O2 Device: O2 Device: Nasal Cannula O2 Flow Rate: O2 Flow Rate (L/min): 2 L/min  Intake/output summary:   Intake/Output Summary (Last 24 hours) at 07/24/2017 1417 Last data filed at 07/24/2017 1330 Gross per 24 hour  Intake 793.17 ml  Output 350 ml  Net 443.17 ml   LBM: Last BM Date: 07/23/17 Baseline Weight: Weight: 71.7 kg (158 lb) Most recent weight: Weight: 71.7 kg (158 lb)       Palliative Assessment/Data: PPS 60%    Flowsheet Rows     Most Recent Value  Intake Tab  Referral Department  Hospitalist  Unit at Time of Referral  Intermediate Care Unit  Palliative Care Primary Diagnosis  Cardiac  Date Notified  07/22/17  Palliative Care Type  New Palliative care  Reason for referral  Clarify Goals of Care  Date of Admission  07/21/17  Date first seen by Palliative Care  07/23/17  # of days Palliative referral response time  1 Day(s)  # of days IP prior to Palliative referral  1  Clinical Assessment  Palliative Performance Scale Score  60%  Psychosocial & Spiritual Assessment  Palliative Care Outcomes  Patient/Family meeting held?  Yes  Who was at the meeting?  patient, daughter [name] on phone  Palliative Care Outcomes  Clarified goals of care, Provided psychosocial or spiritual support, Changed CPR status, Linked to palliative care logitudinal support  Patient/Family wishes: Interventions discontinued/not started   Mechanical Ventilation  Palliative Care follow-up planned  Yes, Facility      Patient Active Problem List   Diagnosis Date Noted  . DNR (do not resuscitate)  discussion   . Palliative care by specialist   . Atrial fibrillation with RVR ([place]) 07/22/2017  . Fall 07/22/2017  . Compression fracture of L1 lumbar vertebra ([place]) 07/22/2017  . Large cell carcinoma of left lung, stage 4 ([place]) 04/24/2017  . Adenocarcinoma of left lung metastatic to liver ([place]) 04/24/2017  . Lung cancer metastatic to bone ([place]) 04/24/2017  . Mass of lower lobe of right lung 03/25/2017  . Melanoma of skin ([place]) 12/06/2016  . Hypokalemia 08/25/2016  . Failure to thrive in adult 04/01/2016  . Blurry vision, right eye   . PCP NOTES >>>>>>>>>>>>>>>>>>>>>>> 03/02/2015  . COPD, group B, by GOLD 2017 classification ([place]) 01/31/2015  . Other fatigue 06/21/2014  . Lightheadedness 06/12/2014  . COPD (chronic obstructive pulmonary disease) ([place]) 09/26/2013  . IBS (irritable bowel syndrome) 04/02/2012  . Goals of care, counseling/discussion 11/25/2011  . Anxiety and depression 07/10/2011  . Parkinson's disease ([place]) 02/03/2011  . Recurrent UTI 11/01/2010  . GERD (gastroesophageal reflux disease) 11/01/2010  . Memory loss 11/01/2010  . Hip pain 09/23/2010  . COMPRESSION FRACTURE, LUMBAR VERTEBRAE 03/28/2010  . Atrial fibrillation ([place]) 03/22/2010  . DIZZINESS 03/22/2010  . Mycotic toenails 10/02/2009  . DYSPNEA ON EXERTION 01/19/2009  . HEMORRHOIDS-INTERNAL 02/14/2008  . Constipation 02/14/2008  . PERSONAL HX COLONIC POLYPS 02/14/2008    Palliative Care Assessment & Plan   HPI:  84 y.o. 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 Found to be in a fib RVR on admission - admitted to stepdown for cardizem drip. Last chemo 07/06/17.  PMT consulted for [place].   Assessment: Met w/patient at bedside. Discussed improvement in HR. Discussed her meeting w/ Dr. Marin Olp this AM. She hopes to  continue cancer treatment. Discussed advanced directives. Shared with her discussion I had with her daughter regarding advanced directives and DNR status. Discussed disposition plan for rehab w/palliative to follow.  Spoke w/ patient's daughter, [name]. Long conversation addressing questions and concerns. She expressed concerns about patient's quality of life with cancer treatment. We discussed having palliative care follow patient at SNF to continue quality of life conversations. She was agreeable.  Recommendations/Plan: - SNF w/pallaitve care - MD please write for palliative to follow patient at SNF in discharge summary -PMT will follow  Goals of Care and Additional Recommendations:  Limitations on Scope of Treatment: DNR  Code Status:  DNR  Prognosis:   Unable to determine  Discharge Planning:  [place] for rehab with Palliative care service follow-up  Care plan  was discussed with patient and daughter, [name]  Thank you for allowing the Palliative Medicine Team to assist in the care of this patient.   Time In: 1420 Time Out: 1500 Total Time 40 minutes Prolonged Time Billed  no       Greater than 50%  of this time was spent counseling and coordinating care related to the above assessment and plan.  Juel Burrow, DNP, AGNP-C Palliative Medicine Team Team Phone # [phone]

## 2017-07-24 NOTE — Progress Notes (Addendum)
PROGRESS NOTE    [name]  GGY:[id number] DOB: 06/20/1927 DOA: 07/21/2017 PCP: Mackie Pai, PA-C   Brief Narrative:  84 y.o. 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   Patient states that she has been constipated in the past 3 weeks. She has abdominal distention, and abdominal pain. Her abdominal pain is diffuse,constant, moderate, dull, nonradiating. She states that her last bowel movement was 3 weeks ago. Denies nausea or vomiting. No fever or chills. Patient states that she accidentally fell off the bed at about 6 AM yesterday morning. She injured her lower back, left hip and left are upper arm. She has moderate pain in those places. She developed hematoma in left upper arm. Patient has mild dry cough and mild SOB, but no chest pain. Denies symptoms of UTI or unilateral weakness. Pt is found to have A fib with RVR with HR up to 129.   ED Course: pt was found to have WBC 6.3, negative troponin, electrolytes renal function okay, temperature normal, tachycardia, tachypnea, oxygen saturation 86% on room air. X-ray of left hip was negative. X-ray of the lumbar spine showed stable L1 compression deformity. X-ray of acute abdomen/chest showed constipation. CT of head and neck negative. Patient is placed on telemetry bed for observation.    Subjective: 4/5/O x4, negative CP, negative S OB, negative abdominal pain.  Negative nausea.  States ate her breakfast this morning again negative postprandial N/V.  Still feels constipated although had a BM this A.m.     Assessment & Plan:   Principal Problem:   Atrial fibrillation with RVR (HCC) Active Problems:   Constipation   Anxiety and depression   COPD (chronic obstructive  pulmonary disease) (HCC)   Large cell carcinoma of left lung, stage 4 (HCC)   Adenocarcinoma of left lung metastatic to liver [facility])   Lung cancer metastatic to bone [facility])   Fall   Compression fracture of L1 lumbar vertebra (HCC)  A. Fib with RVR ( CHA2DS2-VASc Score is 3) -Most likely multifactorial to include significant hypothyroidism, abdominal pain. -4/4 Cardizem 60 mg TID.  Fairly well controlled but leave may have to titrate up once patient becomes active. - Continue Eliquis 5 mg BID  Elevated troponin -Most likely secondary to patient A. fib with RVR causing demand ischemia.  Patient asymptomatic currently Recent Labs  Lab 07/22/17 0332 07/22/17 0923 07/22/17 1855 07/23/17 1851 07/24/17 0240  TROPONINI 0.04* 0.07* 0.07* 0.04* 0.04*  - Echocardiogram not consistent with acute ischemia -Troponins trending down  Constipation -Patient has history of chronic constipation -CT scan abdomen pelvis negative for obstruction. - Treated with enema which relieved her constipation. -4/5 patient still feels constipated and abdomen somewhat distended.  Obtain KUB. -4/5 increase bowel regimen lactulose + magnesium citrate   Fall -Appears to have been mechanical.  All radiological studies negative for acute fractures  Chronic pain syndrome -Stable L1 compression fracture    Depression/anxiety  -Continue home regimen   Xanax 0.25 mg BID PRN  4/4 increase Cymbalta 30 mg BID  Bipolar?  -Per Dr. Marin Olp oncology who knows patient very well patient is bipolar  -4/5 consult Psychiatry: Patient very pleasant extremely anxious, not on a good regimen if truly bipolar (per Dr. Marin Olp manic at times).  Given her diagnosis of metastatic stage IV NSCLC on chemotherapy request  help with best medication regimen.   COPD -Atrovent nebulizer QID -Xopenex nebulizer QID   Stage IV adenocarcinoma of left lung metastatic to liver and bone:  -Per oncology note patient chemotherapy currently  Carboplatin/Alimta/Keytruda .  Last dose appears to have been administered on 3/18 -See Dr. Marin Olp oncology note from 4/5.  Reinforced to patient that she is in remission she is not cured.  Will need follow-up maintenance chemotherapy. -See goals of care  Hypothyroidism -TSH= 34.5/ Free T4 = 0.41 - 4/3 start Synthroid IV 7.5 mcg daily.  Monitor electrolyte closely  Hypokalemia - Potassium goal> 4 -Potassium IV 40 mEq  Hypomagnesemia -Magnesium goal> 2 -Magnesium IV 2 g     Goals of care - 4/3 PALLIATIVE CARE:Patient with stage IV metastatic NSCLC to liver and bone.  Per RN Charlsie Merles telephone encounter with daughter patient is deteriorating and she does not feel that she can provide 24/7 observation for patient.  Consider changing code to DNR discuss short-term vs long-term goals of care  -4/3 social work consult placed.  Per RN note patient's daughter unsure she can continue to care for mother at home, discussed resources that may be available -4/5 PT/OT consult: Currently on chemotherapy.  Patient debilitated believe would benefit significantly from inpatient rehab evaluate for SNF vs CIR      DVT prophylaxis: Eliquis  Code Status: Full Family Communication: None Disposition Plan: TBD   Consultants:  None    Procedures/Significant Events:  4/2 CT head and CT C-spine:- No evidence of acute infarction, hemorrhage, hydrocephalus, extra-axial collection or mass lesion/mass effect.-Skull: Intact.  No focal lesion. - 0.2 cm facet mediated anterolisthesis C3 on C4 is identified.  - Marked loss of disc space height is seen from C3 to C7. Advanced multilevel facet arthropathy is also noted. The facet joints are ankylosed bilaterally at C2-3 and C3-4. 4/2 CT L-spine:-Stable L1 compression deformity. 4/2 DG hips bilateral WO pelvis:Degenerative change without acute abnormality  4/3 Echocardiogram- -LVEF= 55% to 60%.  - Mitral valve:  moderate regurgitation. - Left atrium:  moderately dilated. - Tricuspid valve: moderate regurgitation. - Pulmonary arteries: PA peak pressure: 33 mm Hg (S). 4/3 CT abdomen pelvis W contrast: - Moderate anterior abdominal wall hernia containing small bowel and transverse colon. No obvious obstruction. Colon is diffusely stool-filled. -Possible urinary retention. No bladder wall thickening or filling defects. -Small bilateral pleural effusions with basilar atelectasis or consolidation. -Nonspecific esophageal wall thickening.       I have personally reviewed and interpreted all radiology studies and my findings are as above.  VENTILATOR SETTINGS:   Cultures   Antimicrobials: Anti-infectives (From admission, onward)   None       Devices    LINES / TUBES:      Continuous Infusions:    Objective: Vitals:   07/23/17 2341 07/24/17 0315 07/24/17 0719 07/24/17 0845  BP: 116/61 111/85 109/78   Pulse: 91 98 91   Resp: (!) 22 (!) 22 (!) 26   Temp: 98.9 F (37.2 C) 99.1 F (37.3 C) 97.7 F (36.5 C)   TempSrc: Oral Oral Oral   SpO2: 95% 95% 97% 96%  Weight:      Height:        Intake/Output Summary (Last 24 hours) at 07/24/2017 0912 Last data filed at 07/24/2017 0315 Gross per 24 hour  Intake 733.17 ml  Output 350 ml  Net 383.17 ml   Filed Weights   07/21/17 1820  Weight: 158 lb (71.7 kg)    Physical  Exam:  General: A/O x4,  No acute respiratory distress Neck:  Negative scars, masses, torticollis, lymphadenopathy, JVD Lungs: Clear to auscultation bilaterally without wheezes or crackles Cardiovascular: Irregular regular rhythm and rate, without murmur gallop or rub normal S1 and S2 Abdomen: negative abdominal pain, positive ventral hernia reducible (per patient chronic), positive mild distention, positive soft, bowel sounds, no rebound, no ascites, no appreciable mass Extremities: No significant cyanosis, clubbing, or edema bilateral lower extremities Skin: Negative rashes, lesions,  ulcers Psychiatric:  Negative depression, positive anxiety, negative fatigue, negative mania.  Not sure patient has a good grasp of the seriousness of her cancer stage.  Positive short-term memory loss (cannot remember speaking with Dr. Marin Olp a couple of hours ago) Central nervous system:  Cranial nerves II through XII intact, tongue/uvula midline, all extremities muscle strength 5/5, sensation intact throughout, negative dysarthria, negative expressive aphasia, negative receptive aphasia.  .     Data Reviewed: Care during the described time interval was provided by me .  I have reviewed this patient's available data, including medical history, events of note, physical examination, and all test results as part of my evaluation.   CBC: Recent Labs  Lab 07/21/17 1954 07/22/17 0332 07/23/17 0745 07/24/17 0240  WBC 6.3 8.1 4.2 4.4  NEUTROABS 4.4  --   --   --   HGB 9.4* 8.7* 8.5* 9.1*  HCT 29.2* 27.0* 25.7* 28.2*  MCV 97.7 97.1 99.6 97.9  PLT 109* 110* 134* 101   Basic Metabolic Panel: Recent Labs  Lab 07/21/17 1954 07/22/17 0332 07/23/17 0745 07/24/17 0240  NA 137 139 136 134*  K 3.9 3.6 3.1* 3.7  CL 101 105 105 103  CO2 24 22 22  21*  GLUCOSE 83 100* 81 104*  BUN 15 16 11 8   CREATININE 0.85 0.78 0.67 0.69  CALCIUM 8.8* 8.1* 7.9* 8.2*  MG  --   --  1.6* 1.7  PHOS  --   --  2.6 2.4*   GFR: Estimated Creatinine Clearance: 53.6 mL/min (by C-G formula based on SCr of 0.69 mg/dL). Liver Function Tests: Recent Labs  Lab 07/21/17 1954  AST 24  ALT 17  ALKPHOS 72  BILITOT 0.8  PROT 5.6*  ALBUMIN 2.9*   Recent Labs  Lab 07/21/17 1954  LIPASE 42   Recent Labs  Lab 07/23/17 0745 07/24/17 0240  AMMONIA 26 30   Coagulation Profile: No results for input(s): INR, PROTIME in the last 168 hours. Cardiac Enzymes: Recent Labs  Lab 07/21/17 1954 07/22/17 0332 07/22/17 0923 07/22/17 1855 07/23/17 1851 07/24/17 0240  CKTOTAL 36*  --   --   --   --   --    TROPONINI  --  0.04* 0.07* 0.07* 0.04* 0.04*   BNP (last 3 results) No results for input(s): PROBNP in the last 8760 hours. HbA1C: Recent Labs    07/22/17 0923  HGBA1C 6.0*   CBG: Recent Labs  Lab 07/20/17 1015  GLUCAP 101*   Lipid Profile: Recent Labs    07/22/17 0923  CHOL 159  HDL 60  LDLCALC 90  TRIG 44  CHOLHDL 2.7   Thyroid Function Tests: Recent Labs    07/22/17 0332 07/22/17 0923  TSH 34.565*  --   FREET4  --  0.41*   Anemia Panel: No results for input(s): VITAMINB12, FOLATE, FERRITIN, TIBC, IRON, RETICCTPCT in the last 72 hours. Urine analysis:    Component Value Date/Time   COLORURINE YELLOW 07/21/2017 0304   APPEARANCEUR HAZY (A) 07/21/2017 0304  LABSPEC 1.015 07/21/2017 0304   PHURINE 5.0 07/21/2017 0304   GLUCOSEU NEGATIVE 07/21/2017 0304   GLUCOSEU NEGATIVE 11/14/2016 1612   HGBUR NEGATIVE 07/21/2017 0304   BILIRUBINUR NEGATIVE 07/21/2017 0304   BILIRUBINUR neg 06/19/2016 0950   KETONESUR NEGATIVE 07/21/2017 0304   PROTEINUR NEGATIVE 07/21/2017 0304   UROBILINOGEN 0.2 11/14/2016 1612   NITRITE NEGATIVE 07/21/2017 0304   LEUKOCYTESUR TRACE (A) 07/21/2017 0304   Sepsis Labs: @LABRCNTIP (procalcitonin:4,lacticidven:4)  ) Recent Results (from the past 240 hour(s))  MRSA PCR Screening     Status: None   Collection Time: 07/23/17 12:07 AM  Result Value Ref Range Status   MRSA by PCR NEGATIVE NEGATIVE Final    Comment:        The GeneXpert MRSA Assay (FDA approved for NASAL specimens only), is one component of a comprehensive MRSA colonization surveillance program. It is not intended to diagnose MRSA infection nor to guide or monitor treatment for MRSA infections. Performed at [facility], [place] [address] [place], [place] [address]          Radiology Studies: No results found.      Scheduled Meds: . apixaban  5 mg Oral BID  . cholecalciferol  1,000 Units Oral Daily  . diltiazem  60 mg Oral Q8H  . DULoxetine   30 mg Oral BID  . folic acid  1 mg Oral Daily  . ipratropium  0.5 mg Nebulization TID  . lactulose  20 g Oral TID  . levalbuterol  1.25 mg Nebulization TID  . levothyroxine  37.5 mcg Intravenous Daily  . magnesium citrate  0.5 Bottle Oral Once  . polyethylene glycol  17 g Oral BID  . vitamin C  1,000 mg Oral Daily   Continuous Infusions:    LOS: 2 days    Time spent: 40 minutes    Nahara Dona, Geraldo Docker, MD [facility] Pager [phone]   If 7PM-7AM, please contact night-coverage [email or web address] Password [email or web address] 07/24/2017, 9:12 AM

## 2017-07-24 NOTE — Consult Note (Addendum)
[facility] Consult   Reason for Consult:  Anxiety Referring Physician:  Dr. Sherral Hammers Patient Identification: [name] MRN:  [id number] Principal Diagnosis: Anxiety Diagnosis:   Patient Active Problem List   Diagnosis Date Noted  . DNR (do not resuscitate) discussion [Z71.89]   . Palliative care by specialist [Z51.5]   . Atrial fibrillation with RVR ([place]) [I48.91] 07/22/2017  . Fall [W19.XXXA] 07/22/2017  . Compression fracture of L1 lumbar vertebra ([place]) [S32.010A] 07/22/2017  . Large cell carcinoma of left lung, stage 4 ([place]) [C34.92] 04/24/2017  . Adenocarcinoma of left lung metastatic to liver [facility]) [C34.92, C78.7] 04/24/2017  . Lung cancer metastatic to bone ([place]) [C34.90, C79.51] 04/24/2017  . Mass of lower lobe of right lung [R91.8] 03/25/2017  . Melanoma of skin ([place]) [C43.9] 12/06/2016  . Hypokalemia [E87.6] 08/25/2016  . Failure to thrive in adult [R62.7] 04/01/2016  . Blurry vision, right eye [H53.8]   . PCP NOTES >>>>>>>>>>>>>>>>>>>>>>> [Z09] 03/02/2015  . COPD, group B, by GOLD 2017 classification ([place]) [J44.9] 01/31/2015  . Other fatigue [R53.83] 06/21/2014  . Lightheadedness [R42] 06/12/2014  . COPD (chronic obstructive pulmonary disease) ([place]) [J44.9] 09/26/2013  . IBS (irritable bowel syndrome) [K58.9] 04/02/2012  . Goals of care, counseling/discussion [Z71.89] 11/25/2011  . Anxiety and depression [F41.9, F32.9] 07/10/2011  . Parkinson's disease ([place]) [G20] 02/03/2011  . Recurrent UTI [N39.0] 11/01/2010  . GERD (gastroesophageal reflux disease) [K21.9] 11/01/2010  . Memory loss [R41.3] 11/01/2010  . Hip pain [M25.559] 09/23/2010  . COMPRESSION FRACTURE, LUMBAR VERTEBRAE [S32.009A] 03/28/2010  . Atrial fibrillation ([place]) [I48.91] 03/22/2010  . DIZZINESS [R42] 03/22/2010  . Mycotic toenails [B35.1] 10/02/2009  . DYSPNEA ON EXERTION [R06.09, R09.89] 01/19/2009  . HEMORRHOIDS-INTERNAL [K64.8] 02/14/2008  . Constipation [K59.00] 02/14/2008   . PERSONAL HX COLONIC POLYPS [Z86.010] 02/14/2008    Total Time spent with patient: 1 hour  Subjective:   MONICK RENA is a 84 y.o. female patient admitted with atrial fibrillation with RVR.  HPI:   Per chart review, patient was admitted with abdominal distention and pain secondary to a fall from her bed. She was found to have atrial fibrillation with RVR. She has a history of stage IV adenocarcinoma of left lung with metastases to the liver and bone. She is receiving chemotherapy. She was previously living with her daughter but her daughter no longer feels that she can care for her.  She has a history of bipolar disorder and endorses anxiety. Home medications include Cymbalta 30 mg BID (increased yesterday) and Xanax 0.25 mg BID prescribed by Dr. Kathlene November.   On interview, [name] is alert and oriented x 3. She is able to state why she came to the hospital.  She reports falling multiple times from her bed which required her to call 911.  She was brought to the hospital by EMS.  She does not remember why she fell.  She reports a history of depression and anxiety.  She reports generalized worries. She denies a history of manic symptoms (decreased need for sleep, increase energy or pressured speech).  She has been taking Cymbalta for 6 months.  She has been taking Xanax for the past month.   She reports generalized anxiety.  She was able to run in the past which helped with her anxiety.  She denies problems with sleep or appetite.  She denies SI, HI or AVH.  Past Psychiatric History: Depression and anxiety.   Risk to Self: Is patient at risk for suicide?: No Risk to  Others:  None. Denies HI. Prior Inpatient Therapy:  Denies  Prior Outpatient Therapy:  Her medications are managed by her outpatient provider.   Past Medical History:  Past Medical History:  Diagnosis Date  . Adenocarcinoma of left lung metastatic to liver ([place]) 04/24/2017  . Anxiety   . Arthritis   . Atrial  fibrillation ([place])   . Cardiac arrhythmia due to congenital heart disease   . Chronic constipation   . COPD (chronic obstructive pulmonary disease) ([place])   . Depression   . GERD (gastroesophageal reflux disease)   . Hemorrhoid   . Hyperlipidemia   . Idiopathic hypotension   . Large cell carcinoma of left lung, stage 4 ([place]) 04/24/2017  . Lightheadedness 11/2015  . Lung cancer metastatic to bone ([place]) 04/24/2017  . Mass of lower lobe of right lung 03/25/2017  . Melanoma of skin ([place]) 12/06/2016  . Mycotic toenails 10/27/2012  . Parkinson disease ([place])   . Recurrent UTI   . 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 Varicose veins     Past Surgical History:  Procedure Laterality Date  . APPENDECTOMY  84 years old  . COLON RESECTION  2008  . melanoma removal Left    left cheek  . vitriectomy  08-2015   Family History:  Family History  Problem Relation Age of Onset  . Asthma Brother   . Alcohol abuse Brother   . Throat cancer Father   . Alcohol abuse Father   . Lupus Daughter   . Bipolar disorder Daughter   . Lupus Daughter   . Anxiety disorder Daughter   . Alcohol abuse Sister   . Alcohol abuse Maternal Grandfather   . Alcohol abuse Paternal Grandmother   . Breast cancer Sister   . Colon cancer Neg Hx    Family Psychiatric  History: Daughter with bipolar disorder as well as family history as listed above. Social History:  Social History   Substance and Sexual Activity  Alcohol Use No  . Alcohol/week: 0.0 oz     Social History   Substance and Sexual Activity  Drug Use No    Social History   Socioeconomic History  . Marital status: Widowed    Spouse name: Not on file  . Number of children: 2  . Years of education: Not on file  . Highest education level: Not on file  Occupational History  . Occupation: retiredFirefighter , Animator  Social Needs  . Financial resource strain: Not on file  . Food insecurity:    Worry: Not on file    Inability:  Not on file  . Transportation needs:    Medical: Not on file    Non-medical: Not on file  Tobacco Use  . Smoking status: Former Smoker    Packs/day: 2.00    Years: 30.00    Pack years: 60.00    Types: Cigarettes    Last attempt to quit: 04/21/1980    Years since quitting: 37.2  . Smokeless tobacco: Never Used  . Tobacco comment: onset age 68 -62, up to > 1ppd (almost 2 ppd)  Substance and Sexual Activity  . Alcohol use: No    Alcohol/week: 0.0 oz  . Drug use: No  . Sexual activity: Never  Lifestyle  . Physical activity:    Days per week: Not on file    Minutes per session: Not on file  . Stress: Not on file  Relationships  . Social connections:    Talks on  phone: Not on file    Gets together: Not on file    Attends religious service: Not on file    Active member of club or organization: Not on file    Attends meetings of clubs or organizations: Not on file    Relationship status: Not on file  Other Topics Concern  . Not on file  Social History Narrative   Widowed, lives alone. 1 child in [place] and 1 in [place]. 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   Still drives as of 42-70   Additional Social History: She is from [place]. She lives at home alone.  She is a widow. Her husband died 12 years ago.  She has a daughter in [place] and a daughter in [place].  She denies alcohol or illicit substance use.  She smoked for 3 ppd x 30 years.  She quit at 84 y/o.     Allergies:   Allergies  Allergen Reactions  . Adhesive [Tape] Other (See Comments)    Caused blisters  . Amoxicillin Other (See Comments)    Has patient had a PCN reaction causing immediate rash, facial/tongue/throat swelling, SOB or lightheadedness with hypotension: Yes Has patient had a PCN reaction causing severe rash involving mucus membranes or skin necrosis: No Has patient had a PCN reaction that required hospitalization No Has patient had a PCN reaction occurring within the last 10 years:  Yes If all of the above answers are "NO", then may proceed with Cephalosporin use.   Caused thrush  . Ciprofloxacin Other (See Comments)    Caused thrush  . Ultram [Tramadol] Other (See Comments)    hypotension    Labs:  Results for orders placed or performed during the hospital encounter of 07/21/17 (from the past 48 hour(s))  Troponin I     Status: Abnormal   Collection Time: 07/22/17  6:55 PM  Result Value Ref Range   Troponin I 0.07 (HH) <0.03 ng/mL    Comment: CRITICAL VALUE NOTED.  VALUE IS CONSISTENT WITH PREVIOUSLY REPORTED AND CALLED VALUE. Performed at [facility], [place] [address] [place], [place] [address]   MRSA PCR Screening     Status: None   Collection Time: 07/23/17 12:07 AM  Result Value Ref Range   MRSA by PCR NEGATIVE NEGATIVE    Comment:        The GeneXpert MRSA Assay (FDA approved for NASAL specimens only), is one component of a comprehensive MRSA colonization surveillance program. It is not intended to diagnose MRSA infection nor to guide or monitor treatment for MRSA infections. Performed at [facility], [place] [address] [place], [place] [address]   Basic metabolic panel     Status: Abnormal   Collection Time: 07/23/17  7:45 AM  Result Value Ref Range   Sodium 136 135 - 145 mmol/L   Potassium 3.1 (L) 3.5 - 5.1 mmol/L   Chloride 105 101 - 111 mmol/L   CO2 22 22 - 32 mmol/L   Glucose, Bld 81 65 - 99 mg/dL   BUN 11 6 - 20 mg/dL   Creatinine, Ser 0.67 0.44 - 1.00 mg/dL   Calcium 7.9 (L) 8.9 - 10.3 mg/dL   GFR calc non Af Amer >60 >60 mL/min   GFR calc Af Amer >60 >60 mL/min    Comment: (NOTE) The eGFR has been calculated using the CKD EPI equation. This calculation has not been validated in all clinical situations. eGFR's persistently <60 mL/min  signify possible Chronic Kidney Disease.    Anion gap 9 5 - 15    Comment: Performed at [facility] [address], [place], [place] [address]  Magnesium     Status: Abnormal    Collection Time: 07/23/17  7:45 AM  Result Value Ref Range   Magnesium 1.6 (L) 1.7 - 2.4 mg/dL    Comment: Performed at [facility] [address], [place], [place] [address]  CBC     Status: Abnormal   Collection Time: 07/23/17  7:45 AM  Result Value Ref Range   WBC 4.2 4.0 - 10.5 K/uL   RBC 2.58 (L) 3.87 - 5.11 MIL/uL   Hemoglobin 8.5 (L) 12.0 - 15.0 g/dL   HCT 25.7 (L) 36.0 - 46.0 %   MCV 99.6 78.0 - 100.0 fL   MCH 32.9 26.0 - 34.0 pg   MCHC 33.1 30.0 - 36.0 g/dL   RDW 19.0 (H) 11.5 - 15.5 %   Platelets 134 (L) 150 - 400 K/uL    Comment: Performed at [facility], [place] [address] [place], [place] [address]  Phosphorus     Status: None   Collection Time: 07/23/17  7:45 AM  Result Value Ref Range   Phosphorus 2.6 2.5 - 4.6 mg/dL    Comment: Performed at [facility] [address] [place], [place] [address]  Ammonia     Status: None   Collection Time: 07/23/17  7:45 AM  Result Value Ref Range   Ammonia 26 9 - 35 umol/L    Comment: Performed at [facility], [place] [address] [place], [place] [address]  Troponin I (q 6hr x 3)     Status: Abnormal   Collection Time: 07/23/17  6:51 PM  Result Value Ref Range   Troponin I 0.04 (HH) <0.03 ng/mL    Comment: CRITICAL VALUE NOTED.  VALUE IS CONSISTENT WITH PREVIOUSLY REPORTED AND CALLED VALUE. Performed at [facility], [place] [address] [place], [place] [address]   Basic metabolic panel     Status: Abnormal   Collection Time: 07/24/17  2:40 AM  Result Value Ref Range   Sodium 134 (L) 135 - 145 mmol/L   Potassium 3.7 3.5 - 5.1 mmol/L   Chloride 103 101 - 111 mmol/L   CO2 21 (L) 22 - 32 mmol/L   Glucose, Bld 104 (H) 65 - 99 mg/dL   BUN 8 6 - 20 mg/dL   Creatinine, Ser 0.69 0.44 - 1.00 mg/dL   Calcium 8.2 (L) 8.9 - 10.3 mg/dL   GFR calc non Af Amer >60 >60 mL/min   GFR calc Af Amer >60 >60 mL/min    Comment: (NOTE) The eGFR has been calculated using the CKD EPI equation. This calculation has not  been validated in all clinical situations. eGFR's persistently <60 mL/min signify possible Chronic Kidney Disease.    Anion gap 10 5 - 15    Comment: Performed at [facility] [address], [place], [place] [address]  Magnesium     Status: None   Collection Time: 07/24/17  2:40 AM  Result Value Ref Range   Magnesium 1.7 1.7 - 2.4 mg/dL    Comment: Performed at [facility] [address], [place], [place] [address]  CBC     Status: Abnormal   Collection Time: 07/24/17  2:40 AM  Result Value Ref Range   WBC 4.4 4.0 - 10.5 K/uL  RBC 2.88 (L) 3.87 - 5.11 MIL/uL   Hemoglobin 9.1 (L) 12.0 - 15.0 g/dL   HCT 28.2 (L) 36.0 - 46.0 %   MCV 97.9 78.0 - 100.0 fL   MCH 31.6 26.0 - 34.0 pg   MCHC 32.3 30.0 - 36.0 g/dL   RDW 18.5 (H) 11.5 - 15.5 %   Platelets 180 150 - 400 K/uL    Comment: Performed at [facility] [address] [place], [place] [address]  Phosphorus     Status: Abnormal   Collection Time: 07/24/17  2:40 AM  Result Value Ref Range   Phosphorus 2.4 (L) 2.5 - 4.6 mg/dL    Comment: Performed at [facility] [address], [place], [place] [address]  Ammonia     Status: None   Collection Time: 07/24/17  2:40 AM  Result Value Ref Range   Ammonia 30 9 - 35 umol/L    Comment: Performed at [facility], [place] [address], [place], [place] [address]  Troponin I (q 6hr x 3)     Status: Abnormal   Collection Time: 07/24/17  2:40 AM  Result Value Ref Range   Troponin I 0.04 (HH) <0.03 ng/mL    Comment: CRITICAL VALUE NOTED.  VALUE IS CONSISTENT WITH PREVIOUSLY REPORTED AND CALLED VALUE. Performed at [facility], [place] [address], [place], [place] [address]     Current Facility-Administered Medications  Medication Dose Route Frequency Provider Last Rate Last Dose  . acetaminophen (TYLENOL) tablet 650 mg  650 mg Oral Q6H PRN Ivor Costa, MD      . ALPRAZolam Duanne Moron) tablet 0.25 mg  0.25 mg Oral BID PRN Ivor Costa, MD   0.25 mg at 07/23/17  1632  . apixaban (ELIQUIS) tablet 5 mg  5 mg Oral BID Ivor Costa, MD   5 mg at 07/24/17 5329  . azelastine (ASTELIN) 0.1 % nasal spray 2 spray  2 spray Each Nare QHS PRN Ivor Costa, MD      . cholecalciferol (VITAMIN D) tablet 1,000 Units  1,000 Units Oral Daily Ivor Costa, MD   1,000 Units at 07/24/17 [phone]  . diltiazem (CARDIZEM) tablet 60 mg  60 mg Oral Q8H Allie Bossier, MD   60 mg at 07/24/17 0558  . DULoxetine (CYMBALTA) DR capsule 30 mg  30 mg Oral BID Allie Bossier, MD   30 mg at 07/24/17 6834  . folic acid (FOLVITE) tablet 1 mg  1 mg Oral Daily Ivor Costa, MD   1 mg at 07/24/17 1962  . hydrALAZINE (APRESOLINE) injection 5 mg  5 mg Intravenous Q2H PRN Ivor Costa, MD      . ipratropium (ATROVENT) nebulizer solution 0.5 mg  0.5 mg Nebulization BID Allie Bossier, MD      . lactulose (CHRONULAC) 10 GM/15ML solution 20 g  20 g Oral TID Allie Bossier, MD   20 g at 07/24/17 2297  . levalbuterol (XOPENEX) nebulizer solution 1.25 mg  1.25 mg Nebulization BID Allie Bossier, MD      . levothyroxine (SYNTHROID, LEVOTHROID) injection 37.5 mcg  37.5 mcg Intravenous Daily Allie Bossier, MD   37.5 mcg at 07/24/17 0934  . magnesium citrate solution 0.5 Bottle  0.5 Bottle Oral Once Allie Bossier, MD      . magnesium citrate solution 0.5 Bottle  0.5 Bottle Oral Once Allie Bossier, MD      . methocarbamol (ROBAXIN) tablet 500 mg  500  mg Oral Q8H PRN Ivor Costa, MD      . morphine 4 MG/ML injection 1 mg  1 mg Intravenous Q4H PRN Ivor Costa, MD      . ondansetron [facility]) tablet 4 mg  4 mg Oral Q6H PRN Ivor Costa, MD       Or  . ondansetron (ZOFRAN) injection 4 mg  4 mg Intravenous Q6H PRN Ivor Costa, MD      . oxyCODONE-acetaminophen (PERCOCET/ROXICET) 5-325 MG per tablet 1 tablet  1 tablet Oral Q4H PRN Ivor Costa, MD      . polyethylene glycol (MIRALAX / GLYCOLAX) packet 17 g  17 g Oral BID Ivor Costa, MD   17 g at 07/24/17 1950  . polyvinyl alcohol (LIQUIFILM TEARS) 1.4 % ophthalmic solution  1 drop  1 drop Both Eyes TID PRN Ivor Costa, MD      . potassium chloride 10 mEq in 100 mL IVPB  10 mEq Intravenous Q1 Hr x 4 Allie Bossier, MD      . vitamin C (ASCORBIC ACID) tablet 1,000 mg  1,000 mg Oral Daily Ivor Costa, MD   1,000 mg at 07/24/17 9326  . zolpidem (AMBIEN) tablet 5 mg  5 mg Oral QHS PRN Ivor Costa, MD       Facility-Administered Medications Ordered in Other Encounters  Medication Dose Route Frequency Provider Last Rate Last Dose  . cyanocobalamin ((VITAMIN B-12)) injection 1,000 mcg  1,000 mcg Intramuscular Once [place], Holli Humbles, NP        Musculoskeletal: Strength & Muscle Tone: decreased due to physical deconditioning.  Gait & Station: UTA due to patient lying in bed. Patient leans: N/A  Psychiatric Specialty Exam: Physical Exam  Nursing note and vitals reviewed. Constitutional: She is oriented to person, place, and time. She appears well-developed and well-nourished.  HENT:  Head: Normocephalic and atraumatic.  Neck: Normal range of motion.  Respiratory: Effort normal.  Musculoskeletal: Normal range of motion.  Neurological: She is alert and oriented to person, place, and time.  Skin: No rash noted.  Psychiatric: She has a normal mood and affect. Her speech is normal and behavior is normal. Judgment and thought content normal. Cognition and memory are normal.    Review of Systems  Constitutional: Negative for chills and fever.  Cardiovascular: Negative for chest pain.  Gastrointestinal: Positive for abdominal pain and constipation. Negative for diarrhea, nausea and vomiting.  Psychiatric/Behavioral: Negative for depression, hallucinations, substance abuse and suicidal ideas. The patient is nervous/anxious. The patient does not have insomnia.   All other systems reviewed and are negative.   Blood pressure 115/84, pulse (!) 107, temperature 97.6 F (36.4 C), temperature source Oral, resp. rate (!) 28, height '5\' 10"'$  (1.778 m), weight 71.7 kg (158 lb),  SpO2 93 %.Body mass index is 22.67 kg/m.  General Appearance: Fairly Groomed, elderly, Caucasian female, wearing a hospital gown and lying in bed. NAD.   Eye Contact:  Good  Speech:  Clear and Coherent and Normal Rate  Volume:  Normal  Mood:  Anxious  Affect:  Appropriate and Full Range  Thought Process:  Goal Directed, Linear and Descriptions of Associations: Intact  Orientation:  Full (Time, Place, and Person)  Thought Content:  Logical  Suicidal Thoughts:  No  Homicidal Thoughts:  No  Memory:  Immediate;   Good Recent;   Good Remote;   Good  Judgement:  Good  Insight:  Good  Psychomotor Activity:  Normal  Concentration:  Concentration: Good and Attention Span: Good  Recall:  Good  Fund of Knowledge:  Good  Language:  Good  Akathisia:  No  Handed:  Right  AIMS (if indicated):   N/A  Assets:  Communication Skills Desire for Improvement Housing Social Support  ADL's:  Intact  Cognition:  WNL  Sleep:   Okay   Assessment:  [name] is a 84 y.o. female who was admitted with atrial fibrillation with RVR. She reports generalized anxiety. Cymbalta has been helpful for mood and anxiety. It was increased yesterday so hopefully this will help with anxiety. She denies SI, HI or AVH. She does not warrant inpatient psychiatric hospitalization at this time.    Treatment Plan Summary: -Continue Cymbalta 30 mg BID (increased yesterday) for depression and anxiety.  -Continue Xanax 0.25 mg BID PRN until anxiety is better managed. Benzodiazepines are not recommended for long term management of anxiety and can increase risk of falls and confusion in elderly patients. Recommend outpatient provider taper to discontinuation.    Disposition: No evidence of imminent risk to self or others at present.   Patient does not meet criteria for psychiatric inpatient admission.  Catherine Dingwall, DO 07/24/2017 11:42 AM

## 2017-07-24 NOTE — Clinical Social Work Note (Signed)
Clinical Social Work Assessment  Patient Details  Name: [name] MRN: [id number] Date of Birth: [birth year]  Date of referral:  07/24/17               Reason for consult:  Facility Placement, Discharge Planning                Permission sought to share information with:  Facility Sport and exercise psychologist, Family Supports Permission granted to share information::  Yes, Verbal Permission Granted  Name::     [name]  Agency::  SNF's  Relationship::  Daughter  Contact Information:  [phone]  Housing/Transportation Living arrangements for the past 2 months:  Single Family Home Source of Information:  Patient, Medical Team, Adult Children Patient Interpreter Needed:  None Criminal Activity/Legal Involvement Pertinent to Current Situation/Hospitalization:  No - Comment as needed Significant Relationships:  Adult Children Lives with:  Self Do you feel safe going back to the place where you live?  Yes Need for family participation in patient care:  Yes (Comment)  Care giving concerns:  PT recommending SNF once medically stable for discharge.   Social Worker assessment / plan:  CSW met with patient. No supports at bedside. CSW introduced role and explained that PT recommendations would be discussed. Patient is agreeable to SNF placement. [facility] is first preference as she has been there before. CSW explained they are currently full. CSW provided SNF list for patient to choose a backup facility in case they still do not have a bed once stable for discharge. Patient was intermittently confused throughout our conversation. She asked CSW how I entered the room and asked if I had a key. She then said I could go downstairs to do my work and she would try to stay out of my way. Then she asked if I had brought anything to eat. Nurse tech was outside of room and notified of confusion. Patient did note that she does not feel she should make decisions on her own. She gave CSW  permission to discuss disposition with her daughters. CSW spoke with patient's daughter, [name], on the phone. She is agreeable to SNF as well and is considering hiring 24/7 caregivers once she returns home. CSW emailed her a SNF list for review. Patient currently has a Corporate investment banker. This will have to be discontinued for 24 hours prior to discharge to SNF. No further concerns. CSW encouraged patient and her daughter to contact CSW as needed. CSW will continue to follow patient and her daughter for support and facilitate discharge to SNF once medically stable.   Employment status:  Retired Forensic scientist:  Medicare PT Recommendations:  [place] / Referral to community resources:  [place]  Patient/Family's Response to care:  Patient and her daughter agreeable to SNF placement. Patient's daughters supportive and involved in patient's care. Patient and her daughter appreciated social work intervention.  Patient/Family's Understanding of and Emotional Response to Diagnosis, Current Treatment, and Prognosis:  Patient and her daughter have a good understanding of the reason for admission and her need for rehab prior to returning home. Patient and her daughter appear happy with hospital care.  Emotional Assessment Appearance:  Appears stated age Attitude/Demeanor/Rapport:  Engaged, Gracious Affect (typically observed):  Accepting, Appropriate, Calm, Pleasant Orientation:  Oriented to Self, Oriented to Place, Oriented to Situation Alcohol / Substance use:  Never Used Psych involvement (Current and /or in the community):  No (Comment)  Discharge Needs  Concerns to be addressed:  Care Coordination  Readmission within the last 30 days:  No Current discharge risk:  Dependent with Mobility, Lives alone Barriers to Discharge:  Continued Medical Work up, Requiring sitter/restraints   Candie Chroman, LCSW 07/24/2017, 4:21 PM

## 2017-07-25 DIAGNOSIS — E039 Hypothyroidism, unspecified: Secondary | ICD-10-CM

## 2017-07-25 LAB — BASIC METABOLIC PANEL
ANION GAP: 9 (ref 5–15)
BUN: 12 mg/dL (ref 6–20)
CO2: 21 mmol/L — ABNORMAL LOW (ref 22–32)
Calcium: 8.3 mg/dL — ABNORMAL LOW (ref 8.9–10.3)
Chloride: 103 mmol/L (ref 101–111)
Creatinine, Ser: 0.7 mg/dL (ref 0.44–1.00)
GFR calc Af Amer: >60 mL/min (ref >60)
GLUCOSE: 83 mg/dL (ref 65–99)
Potassium: 4.6 mmol/L (ref 3.5–5.1)
Sodium: 133 mmol/L — ABNORMAL LOW (ref 135–145)

## 2017-07-25 LAB — PHOSPHORUS: PHOSPHORUS: 2.2 mg/dL — AB (ref 2.5–4.6)

## 2017-07-25 LAB — CBC
HCT: 26.9 % — ABNORMAL LOW (ref 36.0–46.0)
Hemoglobin: 8.9 g/dL — ABNORMAL LOW (ref 12.0–15.0)
MCH: 32.8 pg (ref 26.0–34.0)
MCHC: 33.1 g/dL (ref 30.0–36.0)
MCV: 99.3 fL (ref 78.0–100.0)
PLATELETS: 212 10*3/uL (ref 150–400)
RBC: 2.71 MIL/uL — AB (ref 3.87–5.11)
RDW: 18.9 % — ABNORMAL HIGH (ref 11.5–15.5)
WBC: 3.5 10*3/uL — ABNORMAL LOW (ref 4.0–10.5)

## 2017-07-25 LAB — MAGNESIUM: Magnesium: 1.8 mg/dL (ref 1.7–2.4)

## 2017-07-25 LAB — AMMONIA: Ammonia: 30 umol/L (ref 9–35)

## 2017-07-25 MED ORDER — LEVOTHYROXINE SODIUM 25 MCG PO TABS
12.5000 ug | ORAL_TABLET | Freq: Every day | ORAL | Status: DC
  Administered 2017-07-26 – 2017-07-29 (×4): 12.5 ug via ORAL
  Filled 2017-07-25 (×4): qty 1

## 2017-07-25 MED ORDER — SENNA 8.6 MG PO TABS
2.0000 | ORAL_TABLET | Freq: Two times a day (BID) | ORAL | Status: DC
  Administered 2017-07-25 – 2017-07-28 (×8): 17.2 mg via ORAL
  Filled 2017-07-25 (×9): qty 2

## 2017-07-25 MED ORDER — DILTIAZEM HCL ER COATED BEADS 180 MG PO CP24
180.0000 mg | ORAL_CAPSULE | Freq: Every day | ORAL | Status: DC
  Administered 2017-07-25 – 2017-07-27 (×3): 180 mg via ORAL
  Filled 2017-07-25 (×2): qty 1

## 2017-07-25 MED ORDER — IPRATROPIUM-ALBUTEROL 0.5-2.5 (3) MG/3ML IN SOLN: 3.0000 mL | Freq: Four times a day (QID) | RESPIRATORY_TRACT | Status: DC | PRN

## 2017-07-25 NOTE — Progress Notes (Addendum)
PROGRESS NOTE   [name]  YCX:[id number]    DOB: 1927-05-04    DOA: 07/21/2017  PCP: Elise Benne   I have briefly reviewed patients previous medical records in [facility].  Brief Narrative:  84 year old female, lives alone, independent, PMH of A. fib on Eliquis, HLD, GERD, depression, anxiety, metastatic/stage IV non-small cell lung cancer (Dr. Marin Olp, Oncology) presented to ED on 07/21/17 with constipation, no BM for 3 weeks PTA, abdominal distention and pain and mechanical fall.  In ED noted A. fib with RVR up to 129.  Admitted for further evaluation and management.  Oncology consulted.   Assessment & Plan:   Principal Problem:   Anxiety Active Problems:   Constipation   Goals of care, counseling/discussion   COPD (chronic obstructive pulmonary disease) (HCC)   Large cell carcinoma of left lung, stage 4 (HCC)   Adenocarcinoma of left lung metastatic to liver [facility])   Lung cancer metastatic to bone [facility])   Atrial fibrillation with RVR ([place])   Fall   Compression fracture of L1 lumbar vertebra ([place])   DNR (do not resuscitate) discussion   Palliative care by specialist   A. fib with RVR: Presented to ED with heart rate up to 129 bpm.  Likely triggered by pain from fall.  Treated initially with 2 doses of IV Cardizem followed by IV Cardizem infusion.  Oral Cardizem was increased from 30 mg 3 times daily to 60 mg 3 times daily.  No chest pain.  Minimal flat trend troponin elevation likely due to demand ischemia from RVR.CHA2DS2-VASc Score is 3.  Transitioned to Cardizem CD 180 mg daily beginning 4/6 afternoon.  Continue Eliquis.  TTE 07/22/17: LVEF 55-60%.  TSH: 34.57 (please see discussion below).  Not sure if she follows with outpatient cardiology.  Elevated troponin: Likely secondary to A. fib with RVR and demand ischemia.  TTE with normal EF and no wall motion abnormalities.  No chest pain and flat trend.  No further evaluation.  Abdominal pain/constipation:  Patient reports history of chronic intermittent constipation since childhood.  Imaging including CT abdomen and pelvis and x-rays without obstruction.  Bowel regimen.  Having BM.  Improved.  Abdominal pain resolved.  Hypothyroid may also be contributing.  Fall at home: Imaging (CT head, cervical spine, x-ray of left humerus, left hip and lumbar spine) only showed stable L1 compression deformity and no acute changes.  Therapies recommended SNF and as per clinical social worker, possible available bed 06/25/17.  Anxiety and depression: No suicidal or homicidal ideations.  Seen by psychiatry 4/5: Agree with Cymbalta 30 mg twice daily (increased 07/23/17) for depression and anxiety and Xanax 0.25 mg twice daily as needed for short-term anxiety management.  As per primary oncologist, felt that patient had bipolar disorder but no further recommendations by psychiatry.  COPD/chronic hypoxic respiratory failure: No clinical bronchospasm.  As per RN, desaturated overnight while asleep.  May need outpatient OSA evaluation.  Stage IV non-small cell lung cancer/adenocarcinoma: Oncology input 4/5 appreciated: Has responded well to treatment and her last PET scan said to be in remission.  Outpatient follow-up for further management.  Patient has been counseled that she is in remission but not cured.  Chronic pain/stable L1 compression fracture: Stable.  Hypothyroid: TSH 34.5, free T4 0.41.  Change Synthroid from IV 37.5 mcg to 12.5 MCG daily.  Given her advanced age and A. fib, recommend starting at lower dose especially given that she is relatively asymptomatic from thyroid standpoint.  Recommend repeating  TSH in 4-6 weeks and gradually titrating up as needed.  Normocytic anemia: Stable.  Likely due to chronic disease/cancer chemotherapy.  Thrombocytopenia: Transient and resolved.  Hypokalemia: Replaced.  Magnesium normal.  Moderate anterior abdominal wall hernia: Currently uncomplicated.  Acute urinary  retention: Foley catheter placed 4/5 night and apparently 800 mL emptied.  Continue catheter for tonight and trial of voiding 4/7 a.m.     DVT prophylaxis: On Eliquis Code Status: DNR Family Communication: None at bedside. Disposition: Possible DC to SNF pending bed availability, possibly 4/7.   Consultants:  Medical oncology  Procedures:  None  Antimicrobials:  None   Subjective: Overall feels better.  Last BM 4/5.  Passing flatus.  No abdominal pain and indicates that distention has resolved.  Transient hoarseness this morning.  No pain reported.  Wants to get out of bed and mobilize.  As per RN, no acute issues reported.  ROS: As above.  Objective:  Vitals:   07/25/17 0338 07/25/17 0700 07/25/17 0840 07/25/17 1100  BP: 111/78 104/77  91/60  Pulse: 82 99  79  Resp: 19 17  (!) 21  Temp: 98.7 F (37.1 C) 99.3 F (37.4 C)  97.6 F (36.4 C)  TempSrc: Oral Oral  Oral  SpO2: 93% 93% 93% 95%  Weight:      Height:        Examination:  General exam: Pleasant elderly female, moderately built and nourished lying comfortably propped up in bed.  Oral mucosa moist. Respiratory system: Clear to auscultation. Respiratory effort normal. Cardiovascular system: S1 & S2 heard, irregularly irregular. No JVD, murmurs, rubs, gallops or clicks. No pedal edema.  Telemetry personally reviewed: A. fib with mostly controlled ventricular rate in the 90s.  Occasional RVR in the 100s. Gastrointestinal system: Abdomen is protuberant mildly but not distended (usual size as per patient), soft and nontender. No organomegaly or masses felt. Normal bowel sounds heard.  Laparotomy scar +. Central nervous system: Alert and oriented. No focal neurological deficits. Extremities: Symmetric 5 x 5 power. Skin: No rashes, lesions or ulcers Psychiatry: Judgement and insight appear normal. Mood & affect appropriate.     Data Reviewed: I have personally reviewed following labs and imaging  studies  CBC: Recent Labs  Lab 07/21/17 1954 07/22/17 0332 07/23/17 0745 07/24/17 0240 07/25/17 0241  WBC 6.3 8.1 4.2 4.4 3.5*  NEUTROABS 4.4  --   --   --   --   HGB 9.4* 8.7* 8.5* 9.1* 8.9*  HCT 29.2* 27.0* 25.7* 28.2* 26.9*  MCV 97.7 97.1 99.6 97.9 99.3  PLT 109* 110* 134* 180 256   Basic Metabolic Panel: Recent Labs  Lab 07/21/17 1954 07/22/17 0332 07/23/17 0745 07/24/17 0240 07/25/17 0241  NA 137 139 136 134* 133*  K 3.9 3.6 3.1* 3.7 4.6  CL 101 105 105 103 103  CO2 24 22 22  21* 21*  GLUCOSE 83 100* 81 104* 83  BUN 15 16 11 8 12   CREATININE 0.85 0.78 0.67 0.69 0.70  CALCIUM 8.8* 8.1* 7.9* 8.2* 8.3*  MG  --   --  1.6* 1.7 1.8  PHOS  --   --  2.6 2.4* 2.2*   Liver Function Tests: Recent Labs  Lab 07/21/17 1954  AST 24  ALT 17  ALKPHOS 72  BILITOT 0.8  PROT 5.6*  ALBUMIN 2.9*   Coagulation Profile: No results for input(s): INR, PROTIME in the last 168 hours. Cardiac Enzymes: Recent Labs  Lab 07/21/17 1954 07/22/17 0332 07/22/17 3893 07/22/17 1855  07/23/17 1851 07/24/17 0240  CKTOTAL 36*  --   --   --   --   --   TROPONINI  --  0.04* 0.07* 0.07* 0.04* 0.04*   HbA1C: No results for input(s): HGBA1C in the last 72 hours. CBG: Recent Labs  Lab 07/20/17 1015  GLUCAP 101*    Recent Results (from the past 240 hour(s))  MRSA PCR Screening     Status: None   Collection Time: 07/23/17 12:07 AM  Result Value Ref Range Status   MRSA by PCR NEGATIVE NEGATIVE Final    Comment:        The GeneXpert MRSA Assay (FDA approved for NASAL specimens only), is one component of a comprehensive MRSA colonization surveillance program. It is not intended to diagnose MRSA infection nor to guide or monitor treatment for MRSA infections. Performed at [facility], [place] [address] [place], [place] [address]          Radiology Studies: Dg Abd 1 View  Result Date: 07/24/2017 CLINICAL DATA:  Follow-up ileus EXAM: ABDOMEN - 1 VIEW COMPARISON:   07/22/2017 FINDINGS: Scattered large and small bowel gas is noted. Fecal material remains within the left colon. No obstructive changes are seen. No free air is seen. Chronic degenerative changes of the lumbar spine are noted. IMPRESSION: Scattered large and small bowel gas. No obstructive changes are noted. Some retained fecal material is again seen. Electronically Signed   By: Inez Catalina M.D.   On: 07/24/2017 12:50        Scheduled Meds: . apixaban  5 mg Oral BID  . cholecalciferol  1,000 Units Oral Daily  . diltiazem  60 mg Oral Q8H  . DULoxetine  30 mg Oral BID  . folic acid  1 mg Oral Daily  . ipratropium  0.5 mg Nebulization BID  . lactulose  20 g Oral TID  . levalbuterol  1.25 mg Nebulization BID  . levothyroxine  37.5 mcg Intravenous Daily  . magnesium citrate  0.5 Bottle Oral Once  . polyethylene glycol  17 g Oral BID  . vitamin C  1,000 mg Oral Daily   Continuous Infusions:   LOS: 3 days     Vernell Leep, MD, FACP, [facility]. [facility] Pager [phone]  If 7PM-7AM, please contact night-coverage [email or web address] Password [email or web address] 07/25/2017, 12:32 PM

## 2017-07-26 MED ORDER — METOPROLOL TARTRATE 12.5 MG HALF TABLET
12.5000 mg | ORAL_TABLET | Freq: Two times a day (BID) | ORAL | Status: DC
  Administered 2017-07-26: 12.5 mg via ORAL
  Filled 2017-07-26 (×2): qty 1

## 2017-07-26 NOTE — Progress Notes (Addendum)
Notified Md.  Pt still unable to void. Bladder scan = [phone]. Per prior RN Dr wants I&O cath  if scan greater than 350 every 6 hours.  I&O cath sterile technique with another RN.  450 output.  Encourage pt to drink water. Also per prior RN, Md had talked about possible Flomax but no order placed.  No new orders received at this time.  Will continue to monitor. Saunders Revel T

## 2017-07-26 NOTE — Progress Notes (Signed)
PROGRESS NOTE   [name]  IWL:[id number]    DOB: 1928/04/10    DOA: 07/21/2017  PCP: Elise Benne   I have briefly reviewed patients previous medical records in [facility].  Brief Narrative:  84 year old female, lives alone, independent, PMH of A. fib on Eliquis, HLD, GERD, depression, anxiety, metastatic/stage IV non-small cell lung cancer (Dr. Marin Olp, Oncology) presented to ED on 07/21/17 with constipation, no BM for 3 weeks PTA, abdominal distention and pain and mechanical fall.  In ED noted A. fib with RVR up to 129.  Admitted for further evaluation and management.  Oncology consulted.   Assessment & Plan:   Principal Problem:   Anxiety Active Problems:   Constipation   Goals of care, counseling/discussion   COPD (chronic obstructive pulmonary disease) (HCC)   Large cell carcinoma of left lung, stage 4 (HCC)   Adenocarcinoma of left lung metastatic to liver [facility])   Lung cancer metastatic to bone [facility])   Atrial fibrillation with RVR ([place])   Fall   Compression fracture of L1 lumbar vertebra ([place])   DNR (do not resuscitate) discussion   Palliative care by specialist   A. fib with RVR: Presented to ED with heart rate up to 129 bpm.  Likely triggered by pain from fall.  Treated initially with 2 doses of IV Cardizem followed by IV Cardizem infusion.  Oral Cardizem was increased from 30 mg 3 times daily to 60 mg 3 times daily.  No chest pain.  Minimal flat trend troponin elevation likely due to demand ischemia from RVR.CHA2DS2-VASc Score is 3.  Transitioned to Cardizem CD 180 mg daily beginning 4/6 afternoon.  Continue Eliquis.  TTE 07/22/17: LVEF 55-60%.  TSH: 34.57 (please see discussion below).  Patient follows with Dr. Einar Gip, Cardiology.  Since this morning, heart rates in the 90s while at rest but increases to 130s with activity.  Discussed with Dr. Virgina Jock, Cardiology covering who recommended starting metoprolol 12.5 mg twice daily and titrate as needed.   Monitor closely.  Elevated troponin: Likely secondary to A. fib with RVR and demand ischemia.  TTE with normal EF and no wall motion abnormalities.  No chest pain and flat trend.  No further evaluation.  Abdominal pain/constipation: Patient reports history of chronic intermittent constipation since childhood.  Imaging including CT abdomen and pelvis and x-rays without obstruction.  Bowel regimen.  Having BM.  Improved.  Abdominal pain resolved.  Hypothyroid may also be contributing.  Fall at home: Imaging (CT head, cervical spine, x-ray of left humerus, left hip and lumbar spine) only showed stable L1 compression deformity and no acute changes.  Therapies recommended SNF, clinical social work working on placement.  DC to SNF pending better control of her heart rate and voiding after Foley catheter removal this morning.  Anxiety and depression: No suicidal or homicidal ideations.  Seen by psychiatry 4/5: Agree with Cymbalta 30 mg twice daily (increased 07/23/17) for depression and anxiety and Xanax 0.25 mg twice daily as needed for short-term anxiety management.  As per primary oncologist, felt that patient had bipolar disorder but no further recommendations by psychiatry.  COPD/chronic hypoxic respiratory failure: No clinical bronchospasm.  As per RN, desaturated overnight while asleep.  May need outpatient OSA evaluation.  Stage IV non-small cell lung cancer/adenocarcinoma: Oncology input 4/5 appreciated: Has responded well to treatment and her last PET scan said to be in remission.  Outpatient follow-up for further management.  Patient has been counseled that she is in remission but  not cured.  Chronic pain/stable L1 compression fracture: Stable.  Hypothyroid: TSH 34.5, free T4 0.41.  Change Synthroid from IV 37.5 mcg to 12.5 MCG daily.  Given her advanced age and A. fib, recommend starting at lower dose especially given that she is relatively asymptomatic from thyroid standpoint.  Recommend  repeating TSH in 4-6 weeks and gradually titrating up as needed.  Normocytic anemia: Stable.  Likely due to chronic disease/cancer chemotherapy.  Thrombocytopenia: Transient and resolved.  Hypokalemia: Replaced.  Magnesium normal.  Moderate anterior abdominal wall hernia: Currently uncomplicated.  Acute urinary retention: Foley catheter placed 4/5 night and apparently 800 mL emptied.  Foley catheter discontinued 4/7 morning.  Monitor for voiding post removal.     DVT prophylaxis: On Eliquis Code Status: DNR Family Communication: None at bedside. Disposition: Possible DC to SNF pending bed availability, better control of A. fib with RVR and post catheter removal voiding.  Possibly 4/8.   Consultants:  Medical oncology  Procedures:  None  Antimicrobials:  None   Subjective: Denies complaints.  Reports large BM yesterday.  No abdominal pain or distention.  No chest pain, palpitations, dizziness or lightheadedness.  ROS: As above.  Objective:  Vitals:   07/26/17 0742 07/26/17 0938 07/26/17 1143 07/26/17 1500  BP: 102/78  106/70 117/85  Pulse: 90 61 (!) 52 95  Resp: 16 (!) 31 20 20   Temp: 99.3 F (37.4 C)   98 F (36.7 C)  TempSrc: Oral   Axillary  SpO2: 97% 94% 97% 94%  Weight:      Height:        Examination:  General exam: Pleasant elderly female, moderately built and nourished lying comfortably propped up in bed.  Oral mucosa moist. Respiratory system: Clear to auscultation. Respiratory effort normal.  Stable. Cardiovascular system: S1 & S2 heard, irregularly irregular. No JVD, murmurs, rubs, gallops or clicks. No pedal edema.  Telemetry personally reviewed: A. fib with controlled ventricular rate in the 90s overnight and while at rest but persistently RVR in the 120s-130s with activity. Gastrointestinal system: Abdomen is protuberant mildly but not distended (usual size as per patient), soft and nontender. No organomegaly or masses felt. Normal bowel sounds  heard.  Laparotomy scar +.  Has uncomplicated ventral hernia/epigastric region. Central nervous system: Alert and oriented. No focal neurological deficits. Extremities: Symmetric 5 x 5 power. Skin: No rashes, lesions or ulcers Psychiatry: Judgement and insight appear normal. Mood & affect appropriate.     Data Reviewed: I have personally reviewed following labs and imaging studies  CBC: Recent Labs  Lab 07/21/17 1954 07/22/17 0332 07/23/17 0745 07/24/17 0240 07/25/17 0241  WBC 6.3 8.1 4.2 4.4 3.5*  NEUTROABS 4.4  --   --   --   --   HGB 9.4* 8.7* 8.5* 9.1* 8.9*  HCT 29.2* 27.0* 25.7* 28.2* 26.9*  MCV 97.7 97.1 99.6 97.9 99.3  PLT 109* 110* 134* 180 678   Basic Metabolic Panel: Recent Labs  Lab 07/21/17 1954 07/22/17 0332 07/23/17 0745 07/24/17 0240 07/25/17 0241  NA 137 139 136 134* 133*  K 3.9 3.6 3.1* 3.7 4.6  CL 101 105 105 103 103  CO2 24 22 22  21* 21*  GLUCOSE 83 100* 81 104* 83  BUN 15 16 11 8 12   CREATININE 0.85 0.78 0.67 0.69 0.70  CALCIUM 8.8* 8.1* 7.9* 8.2* 8.3*  MG  --   --  1.6* 1.7 1.8  PHOS  --   --  2.6 2.4* 2.2*   Liver Function  Tests: Recent Labs  Lab 07/21/17 1954  AST 24  ALT 17  ALKPHOS 72  BILITOT 0.8  PROT 5.6*  ALBUMIN 2.9*   Coagulation Profile: No results for input(s): INR, PROTIME in the last 168 hours. Cardiac Enzymes: Recent Labs  Lab 07/21/17 1954 07/22/17 0332 07/22/17 0923 07/22/17 1855 07/23/17 1851 07/24/17 0240  CKTOTAL 36*  --   --   --   --   --   TROPONINI  --  0.04* 0.07* 0.07* 0.04* 0.04*   HbA1C: No results for input(s): HGBA1C in the last 72 hours. CBG: Recent Labs  Lab 07/20/17 1015  GLUCAP 101*    Recent Results (from the past 240 hour(s))  MRSA PCR Screening     Status: None   Collection Time: 07/23/17 12:07 AM  Result Value Ref Range Status   MRSA by PCR NEGATIVE NEGATIVE Final    Comment:        The GeneXpert MRSA Assay (FDA approved for NASAL specimens only), is one component of  a comprehensive MRSA colonization surveillance program. It is not intended to diagnose MRSA infection nor to guide or monitor treatment for MRSA infections. Performed at [facility], [place] [address], [place], [place] [address]          Radiology Studies: No results found.      Scheduled Meds: . apixaban  5 mg Oral BID  . cholecalciferol  1,000 Units Oral Daily  . diltiazem  180 mg Oral Daily  . DULoxetine  30 mg Oral BID  . folic acid  1 mg Oral Daily  . levothyroxine  12.5 mcg Oral QAC breakfast  . metoprolol tartrate  12.5 mg Oral BID  . polyethylene glycol  17 g Oral BID  . senna  2 tablet Oral BID  . vitamin C  1,000 mg Oral Daily   Continuous Infusions:   LOS: 4 days     Vernell Leep, MD, FACP, [facility]. [facility] Pager [phone]  If 7PM-7AM, please contact night-coverage [email or web address] Password [email or web address] 07/26/2017, 4:00 PM

## 2017-07-26 NOTE — Evaluation (Signed)
Occupational Therapy Evaluation and defer to SNF Patient Details Name: [name] MRN: [id number] DOB: 05-07-1927 Today's Date: 07/26/2017    History of Present Illness Pt is an 84 y.o. female with PMH of depression, anxiety, [place] disease, a-fib, COPD, metastasized NSCLC stage IV (mets to liver/bone), admitted 07/21/17 with c/o constipation, abdominal pain, and recent fall OOB at home. CT head negative. Xray L hip and humerus negative. Xray lumbar spine shows stable L1 compression fx. Elevated troponin likely secondary to a-fib with RVR.   Clinical Impression   PTA pt mod I with SPC and rollator for mobility and mod I (sits to bathe) for ADL/IADL. Pt is currently presenting with the deficits as listed in OT problem list primarily: deconditioning, decreased balance, and decreased activity tolerance. Pt was min A for transfers with cues for safe hand placement with RW and min guard for sink level grooming requiring seated rest breaks. Pt is very motivated to return to PLOF and maximize independence. SNF level therapy post-acute will be required as Pt lives alone and while she has good support from her daughters- they both live out of state. Will defer further OT treatment to the SNF level. Thank you for the opportunity to serve this patient and her family    Follow Up Recommendations  SNF;Supervision/Assistance - 24 hour    Equipment Recommendations  None recommended by OT    Recommendations for Other Services       Precautions / Restrictions Precautions Precautions: Fall Restrictions Weight Bearing Restrictions: No      Mobility Bed Mobility               General bed mobility comments: Pt OOB in the recliner when OT entered the room  Transfers Overall transfer level: Needs assistance Equipment used: Rolling walker (2 wheeled) Transfers: Sit to/from Stand Sit to Stand: Min assist         General transfer comment: MinA for balance and to steady RW. Cues for  hand placement    Balance Overall balance assessment: Needs assistance Sitting-balance support: No upper extremity supported;Feet supported Sitting balance-Leahy Scale: Fair   Postural control: Posterior lean Standing balance support: Bilateral upper extremity supported;During functional activity Standing balance-Leahy Scale: Poor Standing balance comment: Reliant on UE support                           ADL either performed or assessed with clinical judgement   ADL Overall ADL's : Needs assistance/impaired Eating/Feeding: Modified independent;Sitting   Grooming: Min guard;Oral care;Wash/dry hands;Wash/dry face;Standing Grooming Details (indicate cue type and reason): required 1 seated rest break Upper Body Bathing: Set up;Sitting   Lower Body Bathing: Min guard;Sitting/lateral leans   Upper Body Dressing : Minimal assistance;Sitting   Lower Body Dressing: Moderate assistance;Sit to/from stand   Toilet Transfer: Minimal assistance;Ambulation;RW Toilet Transfer Details (indicate cue type and reason): min A for initial power up Toileting- Water quality scientist and Hygiene: Min guard;Sit to/from stand       Functional mobility during ADLs: Min guard;Rolling walker;Cueing for sequencing General ADL Comments: Pt fatgues easily, and watch HR     Vision Baseline Vision/History: Wears glasses Wears Glasses: At all times Patient Visual Report: No change from baseline       Perception     Praxis      Pertinent Vitals/Pain Pain Assessment: 0-10 Pain Score: 4  Pain Location: Back Pain Descriptors / Indicators: Sore;Discomfort Pain Intervention(s): Repositioned;Monitored during session     Hand  Dominance Right   Extremity/Trunk Assessment Upper Extremity Assessment Upper Extremity Assessment: Generalized weakness   Lower Extremity Assessment Lower Extremity Assessment: Generalized weakness   Cervical / Trunk Assessment Cervical / Trunk Assessment:  Kyphotic   Communication Communication Communication: No difficulties   Cognition Arousal/Alertness: Awake/alert Behavior During Therapy: WFL for tasks assessed/performed Overall Cognitive Status: Impaired/Different from baseline Area of Impairment: Memory;Following commands;Problem solving                     Memory: Decreased short-term memory Following Commands: Follows multi-step commands inconsistently     Problem Solving: Slow processing;Requires verbal cues     General Comments  O2 remained above 90% on RA; HR elevated throughout session 1 episode of Vtach where the HR was 148 - RN aware    Exercises     Shoulder Instructions      Home Living Family/patient expects to be discharged to:: Skilled nursing facility Living Arrangements: Alone   Type of Home: House Home Access: Stairs to enter Entrance Stairs-Number of Steps: 3 Entrance Stairs-Rails: Left Home Layout: Two level;Able to live on main level with bedroom/bathroom Alternate Level Stairs-Number of Steps: 10 Alternate Level Stairs-Rails: Right Bathroom Shower/Tub: Tub/shower unit         Home Equipment: Walker - 2 wheels;Walker - 4 wheels;Cane - single point;Bedside commode;Shower seat;Other (comment)(stair lift)   Additional Comments: Daughters live in different states. No support available locally      Prior Functioning/Environment Level of Independence: Independent with assistive device(s)        Comments: Mod indep with rollator for household and community distances        OT Problem List: Decreased strength;Decreased activity tolerance;Impaired balance (sitting and/or standing);Decreased knowledge of use of DME or AE;Decreased knowledge of precautions;Cardiopulmonary status limiting activity      OT Treatment/Interventions:      OT Goals(Current goals can be found in the care plan section) Acute Rehab OT Goals Patient Stated Goal: Get stronger before returning home OT Goal  Formulation: With patient/family Time For Goal Achievement: 08/09/17 Potential to Achieve Goals: Good  OT Frequency:     Barriers to D/C:            Co-evaluation              AM-PAC PT "6 Clicks" Daily Activity     Outcome Measure Help from another person eating meals?: None Help from another person taking care of personal grooming?: A Little Help from another person toileting, which includes using toliet, bedpan, or urinal?: A Little Help from another person bathing (including washing, rinsing, drying)?: A Little Help from another person to put on and taking off regular upper body clothing?: A Little Help from another person to put on and taking off regular lower body clothing?: A Lot 6 Click Score: 18   End of Session Equipment Utilized During Treatment: Gait belt;Rolling walker Nurse Communication: Mobility status;Other (comment)(one episode of Vtach during mobility)  Activity Tolerance: Patient tolerated treatment well Patient left: in chair;with call bell/phone within reach;with chair alarm set;with family/visitor present  OT Visit Diagnosis: Unsteadiness on feet (R26.81);Other abnormalities of gait and mobility (R26.89);Muscle weakness (generalized) (M62.81)                Time: 1610-9604 OT Time Calculation (min): 34 min Charges:  OT General Charges $OT Visit: 1 Visit OT Evaluation $OT Eval Moderate Complexity: 1 Mod OT Treatments $Self Care/Home Management : 8-22 mins G-Codes:     Hulda Humphrey OTR/L  [place] 07/26/2017, 3:12 PM

## 2017-07-26 NOTE — Progress Notes (Signed)
Pt attempted to void states unable to void, bladder scanned for 226cc in bladder notified dr Algis Liming. To continue to monitor may in and out cath once bladder scan greater than 350cc. Will continue to monitor.

## 2017-07-26 NOTE — Clinical Social Work Note (Signed)
CSW still waiting to get final decision of facility.   [place], [place]

## 2017-07-26 NOTE — Progress Notes (Signed)
OT Cancellation Note  Patient Details Name: [name] MRN: [id number] DOB: Dec 19, 1927   Cancelled Treatment:    Reason Eval/Treat Not Completed: Patient at procedure or test/ unavailable([facility]). OT will continue to follow for eval as schedule allows  Jaci Carrel 07/26/2017, 9:17 AM

## 2017-07-26 NOTE — Plan of Care (Signed)
Continue current care plan 

## 2017-07-26 NOTE — Clinical Social Work Note (Signed)
Pt is not medically cleared for d/c today. CSW spoke with pt's daughter and [facility] has the potential to have beds available tomorrow. Pt's daughter made aware. CSW reached out to facility to get them to take a look at pt's referral. CSW to follow up with facility and pt tomorrow.   [place], [place]

## 2017-07-27 ENCOUNTER — Inpatient Hospital Stay: Payer: Medicare Other | Attending: Hematology & Oncology | Admitting: Hematology & Oncology

## 2017-07-27 ENCOUNTER — Inpatient Hospital Stay: Payer: Medicare Other

## 2017-07-27 LAB — CBC
HEMATOCRIT: 29.4 % — AB (ref 36.0–46.0)
HEMOGLOBIN: 9.5 g/dL — AB (ref 12.0–15.0)
MCH: 31.7 pg (ref 26.0–34.0)
MCHC: 32.3 g/dL (ref 30.0–36.0)
MCV: 98 fL (ref 78.0–100.0)
Platelets: 353 10*3/uL (ref 150–400)
RBC: 3 MIL/uL — ABNORMAL LOW (ref 3.87–5.11)
RDW: 18.7 % — AB (ref 11.5–15.5)
WBC: 4.8 10*3/uL (ref 4.0–10.5)

## 2017-07-27 LAB — RENAL FUNCTION PANEL
ALBUMIN: 2.2 g/dL — AB (ref 3.5–5.0)
Anion gap: 9 (ref 5–15)
BUN: 14 mg/dL (ref 6–20)
CHLORIDE: 99 mmol/L — AB (ref 101–111)
CO2: 25 mmol/L (ref 22–32)
Calcium: 8.5 mg/dL — ABNORMAL LOW (ref 8.9–10.3)
Creatinine, Ser: 0.82 mg/dL (ref 0.44–1.00)
GFR calc Af Amer: >60 mL/min (ref >60)
GFR calc non Af Amer: >60 mL/min (ref >60)
GLUCOSE: 96 mg/dL (ref 65–99)
PHOSPHORUS: 3.6 mg/dL (ref 2.5–4.6)
POTASSIUM: 3.8 mmol/L (ref 3.5–5.1)
Sodium: 133 mmol/L — ABNORMAL LOW (ref 135–145)

## 2017-07-27 MED ORDER — DILTIAZEM LOAD VIA INFUSION
15.0000 mg | Freq: Once | INTRAVENOUS | Status: AC
  Administered 2017-07-27: 15 mg via INTRAVENOUS
  Filled 2017-07-27: qty 15

## 2017-07-27 MED ORDER — SODIUM CHLORIDE 0.9% FLUSH
3.0000 mL | Freq: Two times a day (BID) | INTRAVENOUS | Status: DC
  Administered 2017-07-27 – 2017-07-28 (×3): 3 mL via INTRAVENOUS

## 2017-07-27 MED ORDER — SODIUM CHLORIDE 0.9% FLUSH: 3.0000 mL | INTRAVENOUS | Status: DC | PRN

## 2017-07-27 MED ORDER — SODIUM CHLORIDE 0.9 % IV SOLN
250.0000 mL | INTRAVENOUS | Status: DC
  Administered 2017-07-28: 250 mL via INTRAVENOUS

## 2017-07-27 MED ORDER — DILTIAZEM HCL-DEXTROSE 100-5 MG/100ML-% IV SOLN (PREMIX)
5.0000 mg/h | INTRAVENOUS | Status: DC
  Administered 2017-07-27: 5 mg/h via INTRAVENOUS
  Filled 2017-07-27: qty 100

## 2017-07-27 MED ORDER — METOPROLOL TARTRATE 25 MG PO TABS
25.0000 mg | ORAL_TABLET | Freq: Two times a day (BID) | ORAL | Status: DC
  Administered 2017-07-27 (×2): 25 mg via ORAL
  Filled 2017-07-27 (×2): qty 1

## 2017-07-27 NOTE — Consult Note (Signed)
Reason for Consult: Afib w/RVR Referring Physician: Trial Hospitalist  [name] is an 84 y.o. female.  HPI:   84 year old Caucasian female with persistent atrial fibrillation on Eliquis, history of orthostatic hypotension, mild COPD, h/o stage IV non-small cell lung cancer- currently in remission. She was last seen by Dr. Einar Gip in 03/2017. In view of her high thromboembolic risk, it was recommended to continue anticoagulation until it becomes necessary to discontinue it from malignancy reasons.   Patient is now admitted to the hospital with constipation. She was noted to be in atrial fibrillation with RVR in the ED. Workup shows stable anemia, mildly elevated troponin 0.04-0.07 ng/mL. Echocardiogram shows mild LVH, LVEF 55-60%, moderate mitral regurgitation, moderate tricuspid regurgitation with mild pulmonary hypertension his systolic pressure 33 mmHg.   Patient has been in Afib w/RVR with ventricular rate 120-140 since 04/.07 evening. She is now now on increased dose of diltiazem, up from 30 mg tid to 180 mg once daily, of which she has received one dose. She was also started on metorpolol 12.5 mg bid last night, scheduled to increase to 25 mg twice daily today. She denies any chest pain. Shortness of breath is at baseline. She does endorse palpitations. Denies any recent fever, chills, nausea, vomiting. Constipation continues.   Past Medical History:  Diagnosis Date  . Adenocarcinoma of left lung metastatic to liver ([place]) 04/24/2017  . Anxiety   . Arthritis   . Atrial fibrillation ([place])   . Cardiac arrhythmia due to congenital heart disease   . Chronic constipation   . COPD (chronic obstructive pulmonary disease) ([place])   . Depression   . GERD (gastroesophageal reflux disease)   . Hemorrhoid   . Hyperlipidemia   . Idiopathic hypotension   . Large cell carcinoma of left lung, stage 4 ([place]) 04/24/2017  . Lightheadedness 11/2015  . Lung cancer metastatic to bone ([place]) 04/24/2017   . Mass of lower lobe of right lung 03/25/2017  . Melanoma of skin ([place]) 12/06/2016  . Mycotic toenails 10/27/2012  . Parkinson disease ([place])   . Recurrent UTI   . 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 Varicose veins     Past Surgical History:  Procedure Laterality Date  . APPENDECTOMY  84 years old  . COLON RESECTION  2008  . melanoma removal Left    left cheek  . vitriectomy  08-2015    Family History  Problem Relation Age of Onset  . Asthma Brother   . Alcohol abuse Brother   . Throat cancer Father   . Alcohol abuse Father   . Lupus Daughter   . Bipolar disorder Daughter   . Lupus Daughter   . Anxiety disorder Daughter   . Alcohol abuse Sister   . Alcohol abuse Maternal Grandfather   . Alcohol abuse Paternal Grandmother   . Breast cancer Sister   . Colon cancer Neg Hx     Social History:  reports that she quit smoking about 37 years ago. Her smoking use included cigarettes. She has a 60.00 pack-year smoking history. She has never used smokeless tobacco. She reports that she does not drink alcohol or use drugs.  Allergies:  Allergies  Allergen Reactions  . Adhesive [Tape] Other (See Comments)    Caused blisters  . Amoxicillin Other (See Comments)    Has patient had a PCN reaction causing immediate rash, facial/tongue/throat swelling, SOB or lightheadedness with hypotension: Yes Has patient had a PCN reaction causing severe rash involving mucus membranes or skin  necrosis: No Has patient had a PCN reaction that required hospitalization No Has patient had a PCN reaction occurring within the last 10 years: Yes If all of the above answers are "NO", then may proceed with Cephalosporin use.   Caused thrush  . Ciprofloxacin Other (See Comments)    Caused thrush  . Ultram [Tramadol] Other (See Comments)    hypotension    Medications: I have reviewed the patient's current medications.  Results for orders placed or performed during the hospital encounter of 07/21/17  (from the past 48 hour(s))  CBC     Status: Abnormal   Collection Time: 07/27/17  2:37 AM  Result Value Ref Range   WBC 4.8 4.0 - 10.5 K/uL   RBC 3.00 (L) 3.87 - 5.11 MIL/uL   Hemoglobin 9.5 (L) 12.0 - 15.0 g/dL   HCT 29.4 (L) 36.0 - 46.0 %   MCV 98.0 78.0 - 100.0 fL   MCH 31.7 26.0 - 34.0 pg   MCHC 32.3 30.0 - 36.0 g/dL   RDW 18.7 (H) 11.5 - 15.5 %   Platelets 353 150 - 400 K/uL    Comment: Performed at [facility] [address], [place], [place] [address]  Renal function panel     Status: Abnormal   Collection Time: 07/27/17  2:37 AM  Result Value Ref Range   Sodium 133 (L) 135 - 145 mmol/L   Potassium 3.8 3.5 - 5.1 mmol/L    Comment: DELTA CHECK NOTED   Chloride 99 (L) 101 - 111 mmol/L   CO2 25 22 - 32 mmol/L   Glucose, Bld 96 65 - 99 mg/dL   BUN 14 6 - 20 mg/dL   Creatinine, Ser 0.82 0.44 - 1.00 mg/dL   Calcium 8.5 (L) 8.9 - 10.3 mg/dL   Phosphorus 3.6 2.5 - 4.6 mg/dL   Albumin 2.2 (L) 3.5 - 5.0 g/dL   GFR calc non Af Amer >60 >60 mL/min   GFR calc Af Amer >60 >60 mL/min    Comment: (NOTE) The eGFR has been calculated using the CKD EPI equation. This calculation has not been validated in all clinical situations. eGFR's persistently <60 mL/min signify possible Chronic Kidney Disease.    Anion gap 9 5 - 15    Comment: Performed at [facility] [address], [place], [place] [address]    No results found.  Review of Systems  Constitutional: Negative for weight loss.  HENT: Negative.   Eyes: Negative.   Respiratory: Positive for shortness of breath.   Cardiovascular: Negative for chest pain, orthopnea, claudication and leg swelling.  Gastrointestinal: Positive for constipation. Negative for abdominal pain.  Genitourinary: Negative.   Musculoskeletal: Negative.   Skin: Negative.   Neurological: Negative for loss of consciousness.  Endo/Heme/Allergies: Does not bruise/bleed easily (Skin bruising).  Psychiatric/Behavioral: Negative.   All other  systems reviewed and are negative.  Blood pressure 112/82, pulse 98, temperature (!) 97.5 F (36.4 C), temperature source Oral, resp. rate 20, height '5\' 10"'$  (1.778 m), weight 71.7 kg (158 lb), SpO2 91 %. Physical Exam  Nursing note and vitals reviewed. Constitutional: She is oriented to person, place, and time. She appears well-developed and well-nourished. No distress.  HENT:  Head: Normocephalic and atraumatic.  Neck: Normal range of motion. Neck supple. No JVD present.  Cardiovascular: Intact distal pulses. An irregularly irregular rhythm present. Tachycardia present.  Respiratory: Effort normal and breath sounds normal. She has no wheezes. She has no rales.  [place]y [place] shaped  chest  Musculoskeletal: She exhibits no edema.  Lymphadenopathy:    She has no cervical adenopathy.  Neurological: She is alert and oriented to person, place, and time. No cranial nerve deficit.  Skin: Skin is warm and dry.  Psychiatric: She has a normal mood and affect.   Cardiac studies: EKG 04.06/2017: Afib with controlled ventricular rate 73 bpm  Echocardiogram 07/22/2017: Study Conclusions  - Left ventricle: The cavity size was normal. There was mild   concentric hypertrophy. Systolic function was normal. The   estimated ejection fraction was in the range of 55% to 60%. Wall   motion was normal; there were no regional wall motion   abnormalities. - Mitral valve: There was moderate regurgitation. - Left atrium: The atrium was moderately dilated. - Right atrium: The atrium was mildly dilated. - Atrial septum: No defect or patent foramen ovale was identified. - Tricuspid valve: There was moderate regurgitation. - Pulmonary arteries: PA peak pressure: 33 mm Hg (S).   Assessment: [name]  Afib w/RVR CHA2DS2VASc score 3, annual stroke risk 3.2% Mod MR, mod TR, mild PH Mild troponin elevation: Type 2 MI in the setting of Afib RVR H/o non-small cell lung cancer, currently in  remission Severe constipation COPD Hypothyroidism H/o orthostatic hypotension  Recommendations: Patient is already on much higher doses of AV nodal blocking agents than baseline. Continue diltiazem 180 mg daily, metoprolol 25 mg bid for now. Will add IV diltiazem bolus and infusion today. If no improvement in rate control by 04/09 morning, recommend cardioversion. She is on uninterrupted anticoagulation with eliquis 5 mg bid. Discussed the risks and benefits at length with the patient. NPO after midnight. Keep K around 4, Mag around 2 Rest of the management per the primary team   [name] [name] 07/27/2017, 8:42 AM   Nigel Mormon, MD [facility] Pager: [phone] Office: [phone] If no answer Cell [phone]

## 2017-07-27 NOTE — H&P (View-Only) (Signed)
DNR status will be reversed for periprocedural period on 07/28/2017.  Nigel Mormon, MD [facility] Pager: [phone] Office: [phone] If no answer Cell [phone]

## 2017-07-27 NOTE — Progress Notes (Signed)
DNR status will be reversed for periprocedural period on 07/28/2017.  Nigel Mormon, MD [facility] Pager: [phone] Office: [phone] If no answer Cell [phone]

## 2017-07-27 NOTE — Progress Notes (Addendum)
PROGRESS NOTE   [name]  WJX:[id number]    DOB: 1927/09/05    DOA: 07/21/2017  PCP: Elise Benne   I have briefly reviewed patients previous medical records in [facility].  Brief Narrative:  84 year old female, lives alone, independent, PMH of A. fib on Eliquis, HLD, GERD, depression, anxiety, metastatic/stage IV non-small cell lung cancer (Dr. Marin Olp, Oncology) presented to ED on 07/21/17 with constipation, no BM for 3 weeks PTA, abdominal distention and pain and mechanical fall.  In ED noted A. fib with RVR up to 129.  Admitted for further evaluation and management.  Oncology consulted.  Constipation improved.  Ongoing issues with A. fib with RVR, cardiology consulted and plan cardioversion 4/9.  Foley for acute urinary retention, outpatient urology follow-up.  DC to SNF pending improvement.   Assessment & Plan:   Principal Problem:   Anxiety Active Problems:   Constipation   Goals of care, counseling/discussion   COPD (chronic obstructive pulmonary disease) (HCC)   Large cell carcinoma of left lung, stage 4 (HCC)   Adenocarcinoma of left lung metastatic to liver [facility])   Lung cancer metastatic to bone [facility])   Atrial fibrillation with RVR ([place])   Fall   Compression fracture of L1 lumbar vertebra ([place])   DNR (do not resuscitate) discussion   Palliative care by specialist   A. fib with RVR: Presented to ED with heart rate up to 129 bpm. Treated initially with 2 doses of IV Cardizem followed by IV Cardizem infusion.  Oral Cardizem was increased from 30 mg 3 times daily to 60 mg 3 times daily.  Minimal flat trend troponin elevation likely due to demand ischemia from RVR.CHA2DS2-VASc Score is 3.  Transitioned to Cardizem CD 180 mg daily beginning 4/6.  Continued Eliquis.  TTE 07/22/17: LVEF 55-60%.  TSH: 34.57 (please see discussion below).  Patient follows with Dr. Einar Gip, Cardiology.  Metoprolol 12.5 bid initiated 4/7.  Although ventricular rate is better controlled  in the 80s/90s, has frequent episodes of RVR in the 110s-120s.  Metoprolol increased to 25 mg bid. Cardiology seen today: Started IV Cardizem bolus and infusion and considering cardioversion 4/9 if no improvement in rate control.  Elevated troponin: Likely secondary to A. fib with RVR and demand ischemia.  TTE with normal EF and no wall motion abnormalities.  No chest pain and flat trend.  No further evaluation.  Abdominal pain/constipation: Patient reports history of chronic intermittent constipation since childhood.  Imaging including CT abdomen and pelvis and x-rays without obstruction.  Bowel regimen.  Having almost daily BM.  Improved.  Abdominal pain resolved.  Hypothyroid may also be contributing.  Fall at home: Imaging (CT head, cervical spine, x-ray of left humerus, left hip and lumbar spine) only showed stable L1 compression deformity and no acute changes.  Therapies recommended SNF.Marland Kitchen    Anxiety and depression: No suicidal or homicidal ideations.  Seen by psychiatry 4/5: Agree with Cymbalta 30 mg twice daily (increased 07/23/17) for depression and anxiety and Xanax 0.25 mg twice daily as needed for short-term anxiety management.  As per primary oncologist, felt that patient had bipolar disorder but no further recommendations by psychiatry.  COPD/chronic hypoxic respiratory failure: No clinical bronchospasm.  As per RN, desaturated overnight while asleep.  May need outpatient OSA evaluation.  Stage IV non-small cell lung cancer/adenocarcinoma: Oncology input 4/5 appreciated: Has responded well to treatment and her last PET scan said to be in remission.  Outpatient follow-up for further management.  Patient has  been counseled that she is in remission but not cured.  Chronic pain/stable L1 compression fracture: Stable.  Hypothyroid: TSH 34.5, free T4 0.41.  Change Synthroid from IV 37.5 mcg to 12.5 MCG daily.  Given her advanced age and A. fib, recommend starting at lower dose especially given  that she is relatively asymptomatic from thyroid standpoint.  Recommend repeating TSH in 4-6 weeks and gradually titrating up as needed.  Normocytic anemia: Stable.  Likely due to chronic disease/cancer chemotherapy.  Thrombocytopenia: Transient and resolved.  Hypokalemia: Replaced.  Magnesium normal.  Moderate anterior abdominal wall hernia: Currently uncomplicated.  Acute urinary retention: Foley catheter placed 4/5 night and apparently 800 mL emptied.  Foley catheter discontinued 4/7 morning but patient unable to spontaneously void since then.  Foley catheter reinserted 4/8.  I discussed with Urologist on call Dr. Alinda Money on 4/8, continue Foley catheter, does not recommend Flomax, can do another trial of voiding prior to discharge but if not successful then discharge with Foley catheter to SNF and outpatient follow-up with him in 1 week.  Etiology unclear: Constipation could cause but improved since admission.  CT does not show any overt obstructive etiology.?  Anterior abdominal wall hernia as cause.  No obvious neurological cause noted.     DVT prophylaxis: On Eliquis Code Status: DNR Family Communication: Discussed with daughter, updated care and answered questions. Disposition: DC to SNF pending clinical improvement/A. fib with RVR and cardiac clearance.   Consultants:  Medical oncology Cardiology.  Procedures:  Foley catheter.  Antimicrobials:  None   Subjective: Continues to have daily BM.  Reports abdominal size (protuberant/distention) is chronic and at baseline.  No abdominal pain, nausea or vomiting reported.  Occasional palpitations without chest pain or dyspnea.  Ongoing issues with inability to void urine.  As per RN, no other acute issues noted.  ROS: As above.  Objective:  Vitals:   07/27/17 1300 07/27/17 1319 07/27/17 1330 07/27/17 1430  BP: 115/73  119/73 100/62  Pulse: (!) 128 (!) 115 97 (!) 122  Resp: (!) 26 (!) 22 (!) 22 (!) 23  Temp:      TempSrc:       SpO2: 100% 93% 94% 99%  Weight:      Height:        Examination:  General exam: Pleasant elderly female, moderately built and nourished lying comfortably propped up in bed.  Oral mucosa moist. Respiratory system: Clear to auscultation. Respiratory effort normal.  Stable without change. Cardiovascular system: S1 & S2 heard, irregularly irregular. No JVD, murmurs, rubs, gallops or clicks. No pedal edema.  Telemetry personally reviewed and findings as indicated above. Gastrointestinal system: Abdomen is protuberant (usual size as per patient), soft and nontender. No organomegaly or masses felt. Normal bowel sounds heard.  Laparotomy scar +.  Has uncomplicated ventral hernia/epigastric region.  Stable. Central nervous system: Alert and oriented. No focal neurological deficits. Extremities: Symmetric 5 x 5 power. Skin: No rashes, lesions or ulcers Psychiatry: Judgement and insight appear normal. Mood & affect appropriate.     Data Reviewed: I have personally reviewed following labs and imaging studies  CBC: Recent Labs  Lab 07/21/17 1954 07/22/17 0332 07/23/17 0745 07/24/17 0240 07/25/17 0241 07/27/17 0237  WBC 6.3 8.1 4.2 4.4 3.5* 4.8  NEUTROABS 4.4  --   --   --   --   --   HGB 9.4* 8.7* 8.5* 9.1* 8.9* 9.5*  HCT 29.2* 27.0* 25.7* 28.2* 26.9* 29.4*  MCV 97.7 97.1 99.6 97.9 99.3 98.0  PLT 109* 110* 134* 180 212 937   Basic Metabolic Panel: Recent Labs  Lab 07/22/17 0332 07/23/17 0745 07/24/17 0240 07/25/17 0241 07/27/17 0237  NA 139 136 134* 133* 133*  K 3.6 3.1* 3.7 4.6 3.8  CL 105 105 103 103 99*  CO2 22 22 21* 21* 25  GLUCOSE 100* 81 104* 83 96  BUN 16 11 8 12 14   CREATININE 0.78 0.67 0.69 0.70 0.82  CALCIUM 8.1* 7.9* 8.2* 8.3* 8.5*  MG  --  1.6* 1.7 1.8  --   PHOS  --  2.6 2.4* 2.2* 3.6   Liver Function Tests: Recent Labs  Lab 07/21/17 1954 07/27/17 0237  AST 24  --   ALT 17  --   ALKPHOS 72  --   BILITOT 0.8  --   PROT 5.6*  --   ALBUMIN 2.9* 2.2*     Cardiac Enzymes: Recent Labs  Lab 07/21/17 1954 07/22/17 0332 07/22/17 0923 07/22/17 1855 07/23/17 1851 07/24/17 0240  CKTOTAL 36*  --   --   --   --   --   TROPONINI  --  0.04* 0.07* 0.07* 0.04* 0.04*     Recent Results (from the past 240 hour(s))  MRSA PCR Screening     Status: None   Collection Time: 07/23/17 12:07 AM  Result Value Ref Range Status   MRSA by PCR NEGATIVE NEGATIVE Final    Comment:        The GeneXpert MRSA Assay (FDA approved for NASAL specimens only), is one component of a comprehensive MRSA colonization surveillance program. It is not intended to diagnose MRSA infection nor to guide or monitor treatment for MRSA infections. Performed at [facility], [place] [address], [place], [place] [address]          Radiology Studies: No results found.      Scheduled Meds: . apixaban  5 mg Oral BID  . cholecalciferol  1,000 Units Oral Daily  . diltiazem  180 mg Oral Daily  . DULoxetine  30 mg Oral BID  . folic acid  1 mg Oral Daily  . levothyroxine  12.5 mcg Oral QAC breakfast  . metoprolol tartrate  25 mg Oral BID  . polyethylene glycol  17 g Oral BID  . senna  2 tablet Oral BID  . sodium chloride flush  3 mL Intravenous Q12H  . vitamin C  1,000 mg Oral Daily   Continuous Infusions: . sodium chloride    . diltiazem (CARDIZEM) infusion 5 mg/hr (07/27/17 1037)     LOS: 5 days     Vernell Leep, MD, FACP, [facility]. [facility] Pager [phone]  If 7PM-7AM, please contact night-coverage [email or web address] Password [facility] 07/27/2017, 3:54 PM

## 2017-07-28 ENCOUNTER — Encounter (HOSPITAL_COMMUNITY)
Admission: EM | Disposition: A | Discharge status: Skilled Nursing Facility | Payer: Self-pay | Source: Home / Self Care | Attending: Internal Medicine

## 2017-07-28 ENCOUNTER — Inpatient Hospital Stay (HOSPITAL_COMMUNITY): Payer: Medicare Other | Admitting: Anesthesiology

## 2017-07-28 ENCOUNTER — Encounter (HOSPITAL_COMMUNITY): Payer: Self-pay | Admitting: *Deleted

## 2017-07-28 DIAGNOSIS — F419 Anxiety disorder, unspecified: Secondary | ICD-10-CM

## 2017-07-28 HISTORY — PX: CARDIOVERSION: SHX1299

## 2017-07-28 LAB — BASIC METABOLIC PANEL
ANION GAP: 8 (ref 5–15)
BUN: 12 mg/dL (ref 6–20)
CALCIUM: 8.3 mg/dL — AB (ref 8.9–10.3)
CO2: 24 mmol/L (ref 22–32)
CREATININE: 0.7 mg/dL (ref 0.44–1.00)
Chloride: 101 mmol/L (ref 101–111)
GFR calc Af Amer: >60 mL/min (ref >60)
Glucose, Bld: 90 mg/dL (ref 65–99)
POTASSIUM: 3.5 mmol/L (ref 3.5–5.1)
Sodium: 133 mmol/L — ABNORMAL LOW (ref 135–145)

## 2017-07-28 LAB — MAGNESIUM: MAGNESIUM: 1.4 mg/dL — AB (ref 1.7–2.4)

## 2017-07-28 SURGERY — CARDIOVERSION
Anesthesia: General

## 2017-07-28 MED ORDER — POTASSIUM CHLORIDE CRYS ER 20 MEQ PO TBCR
40.0000 meq | EXTENDED_RELEASE_TABLET | Freq: Once | ORAL | Status: AC
  Administered 2017-07-28: 40 meq via ORAL
  Filled 2017-07-28: qty 2

## 2017-07-28 MED ORDER — LIDOCAINE 2% (20 MG/ML) 5 ML SYRINGE
INTRAMUSCULAR | Status: DC | PRN
  Administered 2017-07-28: 25 mg via INTRAVENOUS

## 2017-07-28 MED ORDER — DILTIAZEM HCL 30 MG PO TABS
30.0000 mg | ORAL_TABLET | Freq: Three times a day (TID) | ORAL | Status: DC
  Administered 2017-07-28 – 2017-07-29 (×4): 30 mg via ORAL
  Filled 2017-07-28 (×6): qty 1

## 2017-07-28 MED ORDER — ALPRAZOLAM 0.25 MG PO TABS
0.2500 mg | ORAL_TABLET | Freq: Two times a day (BID) | ORAL | Status: DC
  Administered 2017-07-28 – 2017-07-29 (×3): 0.25 mg via ORAL
  Filled 2017-07-28 (×3): qty 1

## 2017-07-28 MED ORDER — MAGNESIUM SULFATE 4 GM/100ML IV SOLN
4.0000 g | Freq: Once | INTRAVENOUS | Status: AC
  Administered 2017-07-28: 4 g via INTRAVENOUS
  Filled 2017-07-28: qty 100

## 2017-07-28 MED ORDER — PROPOFOL 10 MG/ML IV BOLUS
INTRAVENOUS | Status: DC | PRN
  Administered 2017-07-28: 20 mg via INTRAVENOUS
  Administered 2017-07-28: 60 mg via INTRAVENOUS

## 2017-07-28 NOTE — Clinical Social Work Note (Addendum)
[facility] is full right now but will notify CSW later this afternoon if plans change and a bed opens up for tomorrow. CSW met with patient and her daughter at bedside to discuss other bed offers in case they are unable to go to [facility]. Second preference is [facility]. Admissions coordinator said they might have a bed tomorrow. Patient and daughter will discuss a third option in case [facility] is unavailable as well.  Dayton Scrape, [place]  3:44 pm As of right now, [facility] has not had a bed open up for tomorrow. Patient's daughter not at bedside. CSW called but voicemail is full.  Dayton Scrape, CSW [phone]  4:06 pm CSW received call back from patient's daughter. Provided update. She stated the patient was not interested in discussing a third choice after our discussion earlier. She will call her to provide update and CSW will try to meet with her before the end of the day.   Dayton Scrape, [place]

## 2017-07-28 NOTE — Progress Notes (Signed)
Back from endo. Dept. By wheelchair awake and alert. On sinus rhythm with pac's and with transient HR -140's.denied any discomfort . Continue to monitor.

## 2017-07-28 NOTE — Interval H&P Note (Signed)
History and Physical Interval Note:  07/28/2017 8:13 AM  [name]  has presented today for surgery, with the diagnosis of a fib  The various methods of treatment have been discussed with the patient and family. After consideration of risks, benefits and other options for treatment, the patient has consented to  Procedure(s): CARDIOVERSION (N/A) as a surgical intervention .  The patient's history has been reviewed, patient examined, no change in status, stable for surgery.  I have reviewed the patient's chart and labs.  Questions were answered to the patient's satisfaction.     [place]

## 2017-07-28 NOTE — Progress Notes (Signed)
PROGRESS NOTE   [name]  BMW:[id number]    DOB: April 29, 1927    DOA: 07/21/2017  PCP: Elise Benne   I have briefly reviewed patients previous medical records in [facility].  Brief Narrative:  84 year old female, lives alone, independent, PMH of A. fib on Eliquis, HLD, GERD, depression, anxiety, metastatic/stage IV non-small cell lung cancer (Dr. Marin Olp, Oncology) presented to ED on 07/21/17 with constipation, no BM for 3 weeks PTA, abdominal distention and pain and mechanical fall.  In ED noted A. fib with RVR up to 129.  Admitted for further evaluation and management.  Oncology consulted.  Constipation improved.  Ongoing issues with A. fib with RVR, cardiology consulted and plan cardioversion 4/9.  Foley for acute urinary retention, outpatient urology follow-up.  DC to SNF pending improvement.   Assessment & Plan:   Principal Problem:   Anxiety Active Problems:   Constipation   Goals of care, counseling/discussion   COPD (chronic obstructive pulmonary disease) (HCC)   Large cell carcinoma of left lung, stage 4 (HCC)   Adenocarcinoma of left lung metastatic to liver [facility])   Lung cancer metastatic to bone [facility])   Atrial fibrillation with RVR ([place])   Fall   Compression fracture of L1 lumbar vertebra ([place])   DNR (do not resuscitate) discussion   Palliative care by specialist   A. fib with RVR: Presented to ED with heart rate up to 129 bpm. Treated initially with 2 doses of IV Cardizem followed by IV Cardizem infusion.  Oral Cardizem  from 30 mg 3 times daily Minimal flat trend troponin elevation likely due to demand ischemia from RVR.CHA2DS2-VASc Score is 3.    Continued Eliquis.  TTE 07/22/17: LVEF 55-60%.  TSH: 34.57 (please see discussion below).  Patient follows with Dr. Einar Gip, Cardiology.  Metoprolol 12.5 bid initiated 4/7. , has frequent episodes of RVR in the 110s-120s.  Metoprolol increased to 25 mg bid.  S/p DCCV 4/9and maintaining NSR right now Cardiology  to determine d/c meds and follow up-suspect close to d/c  Elevated troponin: Likely secondary to A. fib with RVR and demand ischemia.  TTE with normal EF and no wall motion abnormalities.  No chest pain and flat trend.  No further evaluation needed at this time needed at this time  Abdominal pain/constipation: Patient reports history of chronic intermittent constipation since childhood.  Imaging including CT abdomen and pelvis and x-rays without obstruction.  Bowel regimen.  Having almost daily BM.  Improved.  Abdominal pain resolved.  Hypothyroid may also be contributing.  Fall at home: Imaging (CT head, cervical spine, x-ray of left humerus, left hip and lumbar spine) only showed stable L1 compression deformity and no acute changes.  Therapies recommended SNF.Marland Kitchen    Anxiety and depression:  Seen by psychiatry 4/5: Agree with Cymbalta 30 mg twice daily (increased 07/23/17) for depression and anxiety and Xanax 0.25 mg twice daily as needed for short-term anxiety management.  As per primary oncologist, felt that patient had bipolar disorder but no further recommendations by psychiatry.  COPD/chronic hypoxic respiratory failure: No clinical bronchospasm.  As per RN, desaturated overnight while asleep.  May need outpatient OSA evaluation.  Stage IV non-small cell lung cancer/adenocarcinoma: Oncology input 4/5 appreciated: Has responded well to treatment and her last PET scan said to be in remission.  Outpatient follow-up for further management.  Patient has been counseled that she is in remission but not cured.  Chronic pain/stable L1 compression fracture: Stable.  Hypothyroid: TSH 34.5, free  T4 0.41.  Change Synthroid from IV 37.5 mcg to 12.5 MCG daily.  Given her advanced age and A. fib, recommend starting at lower dose especially given that she is relatively asymptomatic from thyroid standpoint.  Recommend repeating TSH in 4-6 weeks and gradually titrating up as needed.  Normocytic anemia: Stable.   Likely due to chronic disease/cancer chemotherapy.  Thrombocytopenia: Transient and resolved.  Hypokalemia: Replaced.  Magnesium normal.  Moderate anterior abdominal wall hernia: Currently uncomplicated.  Acute urinary retention: Foley catheter placed 4/5 night and apparently 800 mL emptied.  Foley catheter discontinued 4/7 morning but patient unable to spontaneously void since then.  Foley catheter reinserted 4/8.  I discussed with Urologist on call Dr. Alinda Money on 4/8, continue Foley catheter, does not recommend Flomax-voiding prior to discharge but if not successful then discharge with Foley catheter to SNF and outpatient follow-up with him in 1 week.    Anterior abdominal wall hernia as cause.  Will clamp foley am 4/10     DVT prophylaxis: On Eliquis Code Status: DNR Family Communication: called family-no response Disposition: DC to SNF pending clinical improvement/A. fib with RVR and cardiac clearance.   Consultants:  Medical oncology Cardiology.  Procedures:  Foley catheter.  Antimicrobials:  None   Subjective: Awake alert in nad Anxious no cp Hungry a little confused  No cp Objective:  Vitals:   07/28/17 1206 07/28/17 1300 07/28/17 1600 07/28/17 1634  BP: 98/63 104/68  100/70  Pulse: 67     Resp: (!) 23 20  13   Temp: 98.2 F (36.8 C)   98.4 F (36.9 C)  TempSrc: Oral   Oral  SpO2: 98%  98%   Weight:      Height:        Examination:  Frail pleasan tin nad no distress eomi ncat s1 s2 no m/r/g abd soft obese slight tender no rebound Neuro intaxct NSR on monitor   Data Reviewed: I have personally reviewed following labs and imaging studies  CBC: Recent Labs  Lab 07/21/17 1954 07/22/17 0332 07/23/17 0745 07/24/17 0240 07/25/17 0241 07/27/17 0237  WBC 6.3 8.1 4.2 4.4 3.5* 4.8  NEUTROABS 4.4  --   --   --   --   --   HGB 9.4* 8.7* 8.5* 9.1* 8.9* 9.5*  HCT 29.2* 27.0* 25.7* 28.2* 26.9* 29.4*  MCV 97.7 97.1 99.6 97.9 99.3 98.0  PLT 109* 110* 134*  180 212 299   Basic Metabolic Panel: Recent Labs  Lab 07/23/17 0745 07/24/17 0240 07/25/17 0241 07/27/17 0237 07/28/17 0548  NA 136 134* 133* 133* 133*  K 3.1* 3.7 4.6 3.8 3.5  CL 105 103 103 99* 101  CO2 22 21* 21* 25 24  GLUCOSE 81 104* 83 96 90  BUN 11 8 12 14 12   CREATININE 0.67 0.69 0.70 0.82 0.70  CALCIUM 7.9* 8.2* 8.3* 8.5* 8.3*  MG 1.6* 1.7 1.8  --  1.4*  PHOS 2.6 2.4* 2.2* 3.6  --    Liver Function Tests: Recent Labs  Lab 07/21/17 1954 07/27/17 0237  AST 24  --   ALT 17  --   ALKPHOS 72  --   BILITOT 0.8  --   PROT 5.6*  --   ALBUMIN 2.9* 2.2*   Cardiac Enzymes: Recent Labs  Lab 07/21/17 1954 07/22/17 0332 07/22/17 0923 07/22/17 1855 07/23/17 1851 07/24/17 0240  CKTOTAL 36*  --   --   --   --   --   TROPONINI  --  0.04* 0.07* 0.07* 0.04* 0.04*     Recent Results (from the past 240 hour(s))  MRSA PCR Screening     Status: None   Collection Time: 07/23/17 12:07 AM  Result Value Ref Range Status   MRSA by PCR NEGATIVE NEGATIVE Final    Comment:        The GeneXpert MRSA Assay (FDA approved for NASAL specimens only), is one component of a comprehensive MRSA colonization surveillance program. It is not intended to diagnose MRSA infection nor to guide or monitor treatment for MRSA infections. Performed at [facility], [place] [address], [place], [place] [address]          Radiology Studies: No results found.      Scheduled Meds: . ALPRAZolam  0.25 mg Oral BID  . apixaban  5 mg Oral BID  . cholecalciferol  1,000 Units Oral Daily  . diltiazem  30 mg Oral Q8H  . DULoxetine  30 mg Oral BID  . folic acid  1 mg Oral Daily  . levothyroxine  12.5 mcg Oral QAC breakfast  . polyethylene glycol  17 g Oral BID  . senna  2 tablet Oral BID  . sodium chloride flush  3 mL Intravenous Q12H  . vitamin C  1,000 mg Oral Daily   Continuous Infusions: . sodium chloride Stopped (07/28/17 0849)     LOS: 6 days     Verneita Griffes, MD [facility] (P) [phone]   If 7PM-7AM, please contact night-coverage [email or web address] Password [email or web address] 07/28/2017, 6:00 PM

## 2017-07-28 NOTE — Progress Notes (Signed)
PT Cancellation Note  Patient Details Name: [name] MRN: [id number] DOB: 10-28-1927   Cancelled Treatment:    Reason Eval/Treat Not Completed: Patient at procedure or test/unavailable. Will follow-up for PT treatment as schedule permits.  Mabeline Caras, PT, DPT Acute Rehab Services  Pager: [phone] 07/28/2017, 7:56 AM

## 2017-07-28 NOTE — Progress Notes (Signed)
Subjective:  Doing well post cardioversion In sinus rhythym  Objective:  Vital Signs in the last 24 hours: Temp:  [98.2 F (36.8 C)-99.4 F (37.4 C)] 98.2 F (36.8 C) (04/09 0752) Pulse Rate:  [75-134] 95 (04/09 0752) Resp:  [13-29] 22 (04/09 0752) BP: (95-122)/(62-102) 122/78 (04/09 0752) SpO2:  [83 %-100 %] 94 % (04/09 0752)  Intake/Output from previous day: 04/08 0701 - 04/09 0700 In: 940.7 [P.O.:840; I.V.:100.7] Out: 1450 [Urine:1450] Intake/Output from this shift: Total I/O In: -  Out: 300 [Urine:300]  Physical Exam: Nursing note and vitals reviewed. Constitutional: She is oriented to person, place, and time. She appears well-developed and well-nourished. No distress.  HENT:  Head: Normocephalic and atraumatic.  Neck: Normal range of motion. Neck supple. No JVD present.  Cardiovascular: Intact distal pulses. Regular rhythm. Soft II/VI holosystolic murmur apex.  Respiratory: Effort normal and breath sounds normal. She has no wheezes. She has no rales.  Barrell shaped chest  Musculoskeletal: She exhibits no edema.  Lymphadenopathy:    She has no cervical adenopathy.  Neurological: She is alert and oriented to person, place, and time. No cranial nerve deficit.  Skin: Skin is warm and dry.  Psychiatric: She has a normal mood and affect.      Lab Results: Recent Labs    07/27/17 0237  WBC 4.8  HGB 9.5*  PLT 353   Recent Labs    07/27/17 0237 07/28/17 0548  NA 133* 133*  K 3.8 3.5  CL 99* 101  CO2 25 24  GLUCOSE 96 90  BUN 14 12  CREATININE 0.Catherine 0.70   No results for input(s): TROPONINI in the last 72 hours.  Invalid input(s): CK, MB Hepatic Function Panel Recent Labs    07/27/17 0237  ALBUMIN 2.2*    Cardiac studies: EKG 04.06/2017: Afib with controlled ventricular rate 73 bpm  Echocardiogram 07/22/2017: Study Conclusions  - Left ventricle: The cavity size was normal. There was mild concentric hypertrophy. Systolic function was  normal. The estimated ejection fraction was in the range of 55% to 60%. Wall motion was normal; there were no regional wall motion abnormalities. - Mitral valve: There was moderate regurgitation. - Left atrium: The atrium was moderately dilated. - Right atrium: The atrium was mildly dilated. - Atrial septum: No defect or patent foramen ovale was identified. - Tricuspid valve: There was moderate regurgitation. - Pulmonary arteries: PA peak pressure: 33 mm Hg (S).   Assessment: [name]  Afib w/RVR CHA2DS2VASc score 3, annual stroke risk 3.2%, now in sinus rhythm s/p cardioversion Mod MR, mod TR, mild PH Mild troponin elevation: Type 2 MI in the setting of Afib RVR H/o non-small cell lung cancer, currently in remission Severe constipation COPD Hypothyroidism H/o orthostatic hypotension  Recommendations: Given patient's baseline orthostatic hypotension, and now sinus rhythm with rate in 60s, will decrease diltiazem down to 30 mg PO tid. It appears that she was off IV diltiazem since last night. Would not resume IV diltiazem. Also stopped metoprolol. Monitor heart rate today Ordered K and Mg to keep around 4 and 2 respectively.  Rest of the management per the primary team       LOS: 6 days    Wynelle Dreier J Olson Lucarelli 07/28/2017, 8:16 AM  Nigel Mormon, MD [facility] Pager: [phone] Office: [phone] If no answer Cell [phone]

## 2017-07-28 NOTE — Anesthesia Preprocedure Evaluation (Signed)
Anesthesia Evaluation  Patient identified by MRN, date of birth, ID band Patient awake    Reviewed: Allergy & Precautions, H&P , NPO status , Patient's Chart, lab work & pertinent test results  Airway Mallampati: II   Neck ROM: full    Dental   Pulmonary COPD, former smoker,    breath sounds clear to auscultation       Cardiovascular + dysrhythmias Atrial Fibrillation  Rhythm:regular Rate:Normal     Neuro/Psych PSYCHIATRIC DISORDERS Anxiety Depression    GI/Hepatic GERD  ,  Endo/Other    Renal/GU      Musculoskeletal  (+) Arthritis ,   Abdominal   Peds  Hematology  (+) anemia ,   Anesthesia Other Findings   Reproductive/Obstetrics                             Anesthesia Physical Anesthesia Plan  ASA: III  Anesthesia Plan: General   Post-op Pain Management:    Induction: Intravenous  PONV Risk Score and Plan: Propofol infusion and Treatment may vary due to age or medical condition  Airway Management Planned: Mask  Additional Equipment:   Intra-op Plan:   Post-operative Plan:   Informed Consent: I have reviewed the patients History and Physical, chart, labs and discussed the procedure including the risks, benefits and alternatives for the proposed anesthesia with the patient or authorized representative who has indicated his/her understanding and acceptance.     Plan Discussed with: CRNA and Anesthesiologist  Anesthesia Plan Comments:         Anesthesia Quick Evaluation

## 2017-07-28 NOTE — Transfer of Care (Signed)
Immediate Anesthesia Transfer of Care Note  Patient: [name]  Procedure(s) Performed: CARDIOVERSION (N/A )  Patient Location: Endoscopy Unit  Anesthesia Type:General  Level of Consciousness: sedated and patient cooperative  Airway & Oxygen Therapy: Patient Spontanous Breathing  Post-op Assessment: Report given to RN and Post -op Vital signs reviewed and stable  Post vital signs: Reviewed and stable  Last Vitals:  Vitals Value Taken Time  BP    Temp    Pulse    Resp    SpO2      Last Pain:  Vitals:   07/28/17 0752  TempSrc:   PainSc: 0-No pain         Complications: No apparent anesthesia complications

## 2017-07-28 NOTE — Progress Notes (Signed)
Physical Therapy Treatment Patient Details Name: [name] MRN: [id number] DOB: 1927-11-30 Today's Date: 07/28/2017    History of Present Illness Pt is an 84 y.o. female with PMH of depression, anxiety, Parksinon disease, a-fib, COPD, metastasized NSCLC stage IV (mets to liver/bone), admitted 07/21/17 with c/o constipation, abdominal pain, and recent fall OOB at home. CT head negative. Xray L hip and humerus negative. Xray lumbar spine shows stable L1 compression fx. Elevated troponin likely secondary to a-fib with RVR. S/p cardioversion 4/9.    PT Comments    Pt slowly progressing with mobility. Today's session focused on performing ADL tasks while standing at sink; able to do so with RW and min guard for balance. Heavy reliance on UE support and easily fatigued, requiring cues to correct forward flexed posture and bilat knee instability. HR 71-89 during session. Continue to recommend SNF-level therapies at d/c. Will follow acutely.   Follow Up Recommendations  SNF;Supervision/Assistance - 24 hour     Equipment Recommendations  None recommended by PT    Recommendations for Other Services       Precautions / Restrictions Precautions Precautions: Fall Restrictions Weight Bearing Restrictions: No    Mobility  Bed Mobility Overal bed mobility: Needs Assistance Bed Mobility: Supine to Sit     Supine to sit: Min guard        Transfers Overall transfer level: Needs assistance Equipment used: Rolling walker (2 wheeled) Transfers: Sit to/from Stand Sit to Stand: Min guard         General transfer comment: Stood at sink >8 minutes with RW and min guard for balance; required repeated cues to stand upright, as pt flexes forward resulting in bilat knee instability  Ambulation/Gait Ambulation/Gait assistance: Min guard Ambulation Distance (Feet): 10 Feet Assistive device: Rolling walker (2 wheeled) Gait Pattern/deviations: Shuffle;Trunk flexed Gait velocity:  Decreased Gait velocity interpretation: Below normal speed for age/gender General Gait Details: Amb from sink to chair with RW and min guard for balance; fatigue from performing ADLs at sink limiting further amb distances   Science writer    Modified Rankin (Stroke Patients Only)       Balance Overall balance assessment: Needs assistance Sitting-balance support: No upper extremity supported;Feet supported Sitting balance-Leahy Scale: Fair   Postural control: Posterior lean Standing balance support: Bilateral upper extremity supported;During functional activity Standing balance-Leahy Scale: Poor Standing balance comment: Reliant on UE support                            Cognition Arousal/Alertness: Awake/alert Behavior During Therapy: WFL for tasks assessed/performed Overall Cognitive Status: Impaired/Different from baseline Area of Impairment: Memory;Following commands;Problem solving;Attention                   Current Attention Level: Selective Memory: Decreased short-term memory Following Commands: Follows multi-step commands inconsistently     Problem Solving: Slow processing;Requires verbal cues        Exercises      General Comments General comments (skin integrity, edema, etc.): Daughter present during session. HR 71-89. Supine BP 96/62, seated BP 100/56      Pertinent Vitals/Pain Pain Assessment: Faces Faces Pain Scale: No hurt    Home Living                      Prior Function  PT Goals (current goals can now be found in the care plan section) Acute Rehab PT Goals Patient Stated Goal: Get stronger before returning home PT Goal Formulation: With patient Time For Goal Achievement: 08/07/17 Potential to Achieve Goals: Good Progress towards PT goals: Progressing toward goals    Frequency    Min 2X/week      PT Plan Current plan remains appropriate    Co-evaluation               AM-PAC PT "6 Clicks" Daily Activity  Outcome Measure  Difficulty turning over in bed (including adjusting bedclothes, sheets and blankets)?: A Little Difficulty moving from lying on back to sitting on the side of the bed? : A Little Difficulty sitting down on and standing up from a chair with arms (e.g., wheelchair, bedside commode, etc,.)?: Unable Help needed moving to and from a bed to chair (including a wheelchair)?: A Little Help needed walking in hospital room?: A Little Help needed climbing 3-5 steps with a railing? : A Lot 6 Click Score: 15    End of Session Equipment Utilized During Treatment: Gait belt Activity Tolerance: Patient tolerated treatment well;Patient limited by fatigue Patient left: in chair;with call bell/phone within reach;with family/visitor present;with chair alarm set Nurse Communication: Mobility status PT Visit Diagnosis: Other abnormalities of gait and mobility (R26.89);Muscle weakness (generalized) (M62.81)     Time: 2671-2458 PT Time Calculation (min) (ACUTE ONLY): 28 min  Charges:  $Therapeutic Activity: 23-37 mins                    G Codes:      Mabeline Caras, PT, DPT Acute Rehab Services  Pager: [place] 07/28/2017, 1:48 PM

## 2017-07-28 NOTE — Anesthesia Procedure Notes (Signed)
Procedure Name: General with mask airway Date/Time: 07/28/2017 8:02 AM Performed by: Lance Coon, CRNA Pre-anesthesia Checklist: Patient identified, Emergency Drugs available, Suction available, Patient being monitored and Timeout performed Patient Re-evaluated:Patient Re-evaluated prior to induction Oxygen Delivery Method: Ambu bag Preoxygenation: Pre-oxygenation with 100% oxygen Induction Type: IV induction Ventilation: Mask ventilation without difficulty

## 2017-07-28 NOTE — CV Procedure (Signed)
Direct current cardioversion:  Indication symptomatic A. Fibrillation.  Procedure: Under deep sedation administered and monitored by anesthesiology, synchronized direct current cardioversion performed. Patient was delivered with 1120, 150, and 200 Joules of electricity X 3 with success to NSR. Patient tolerated the procedure well. No immediate complication noted.   Nigel Mormon, MD [facility]. [facility] Pager: [phone] Office: [phone] If no answer Cell [phone]

## 2017-07-28 NOTE — Progress Notes (Signed)
Transported  to endoscopy by wheelchair stable., eliquis given.

## 2017-07-28 NOTE — Progress Notes (Signed)
Ms. Coletti is still in the hospital.  She still has issues with atrial fibrillation.  She still has a rapid ventricular response.  Cardiology wants to try a cardioversion.  Ms. Fairbairn has been very reluctant to do this.  She seems to have a notion that this is going to "do me in. "    I told her that the cardioversion would help her.  I told her that if her heart cannot get back into a stable rhythm, that we will not be able to do chemotherapy on her and that her cancer will begin to grow again.  Hopefully, this has allowed her to have a better perspective of the situation.  Her labs back today showed potassium of 3.5.  Her magnesium is 1.4.  She still looks pretty good.  Hopefully, she will be able to have the cardioversion.  I do believe that this is in her best interest.  I told her that.  I think she has a better "mindset" regarding the cardioversion.  She has no change with her physical exam.  Her temperature is 99.4.  Pulse 85.  Blood pressure 113/78.  Her lungs sound clear bilaterally.  Oral exam shows no mucositis.  Cardiac exam regular rate and tachycardic.  Abdomen is soft.  Extremities shows no clubbing, cyanosis or edema.  Hopefully, the atrial fibrillation will be sorted out and resolved.  I told her that again, we cannot treat her lung cancer until her heart status improves.  I think this will motivate her now to get her atrial fibrillation converted back to normal sinus rhythm.  I will be out of town for the next 3 days.  If there are any questions or issues, please call 1 of my colleagues.  Lattie Haw, MD  [place] [phone]

## 2017-07-29 DIAGNOSIS — R338 Other retention of urine: Secondary | ICD-10-CM | POA: Diagnosis not present

## 2017-07-29 DIAGNOSIS — R2689 Other abnormalities of gait and mobility: Secondary | ICD-10-CM | POA: Diagnosis not present

## 2017-07-29 DIAGNOSIS — W19XXXD Unspecified fall, subsequent encounter: Secondary | ICD-10-CM | POA: Diagnosis not present

## 2017-07-29 DIAGNOSIS — S32000A Wedge compression fracture of unspecified lumbar vertebra, initial encounter for closed fracture: Secondary | ICD-10-CM | POA: Diagnosis not present

## 2017-07-29 DIAGNOSIS — J449 Chronic obstructive pulmonary disease, unspecified: Secondary | ICD-10-CM | POA: Diagnosis not present

## 2017-07-29 DIAGNOSIS — Z7901 Long term (current) use of anticoagulants: Secondary | ICD-10-CM | POA: Diagnosis not present

## 2017-07-29 DIAGNOSIS — K5900 Constipation, unspecified: Secondary | ICD-10-CM | POA: Diagnosis not present

## 2017-07-29 DIAGNOSIS — F419 Anxiety disorder, unspecified: Secondary | ICD-10-CM | POA: Diagnosis not present

## 2017-07-29 DIAGNOSIS — C787 Secondary malignant neoplasm of liver and intrahepatic bile duct: Secondary | ICD-10-CM | POA: Diagnosis not present

## 2017-07-29 DIAGNOSIS — R600 Localized edema: Secondary | ICD-10-CM | POA: Diagnosis not present

## 2017-07-29 DIAGNOSIS — C3490 Malignant neoplasm of unspecified part of unspecified bronchus or lung: Secondary | ICD-10-CM | POA: Diagnosis not present

## 2017-07-29 DIAGNOSIS — K5909 Other constipation: Secondary | ICD-10-CM | POA: Diagnosis not present

## 2017-07-29 DIAGNOSIS — C3491 Malignant neoplasm of unspecified part of right bronchus or lung: Secondary | ICD-10-CM | POA: Diagnosis not present

## 2017-07-29 DIAGNOSIS — Z79899 Other long term (current) drug therapy: Secondary | ICD-10-CM | POA: Diagnosis not present

## 2017-07-29 DIAGNOSIS — C3431 Malignant neoplasm of lower lobe, right bronchus or lung: Secondary | ICD-10-CM | POA: Diagnosis not present

## 2017-07-29 DIAGNOSIS — K219 Gastro-esophageal reflux disease without esophagitis: Secondary | ICD-10-CM | POA: Diagnosis not present

## 2017-07-29 DIAGNOSIS — L89612 Pressure ulcer of right heel, stage 2: Secondary | ICD-10-CM | POA: Diagnosis not present

## 2017-07-29 DIAGNOSIS — I4891 Unspecified atrial fibrillation: Secondary | ICD-10-CM | POA: Diagnosis not present

## 2017-07-29 DIAGNOSIS — M549 Dorsalgia, unspecified: Secondary | ICD-10-CM | POA: Diagnosis not present

## 2017-07-29 DIAGNOSIS — F0390 Unspecified dementia without behavioral disturbance: Secondary | ICD-10-CM | POA: Diagnosis not present

## 2017-07-29 DIAGNOSIS — R278 Other lack of coordination: Secondary | ICD-10-CM | POA: Diagnosis not present

## 2017-07-29 DIAGNOSIS — Z9221 Personal history of antineoplastic chemotherapy: Secondary | ICD-10-CM | POA: Diagnosis not present

## 2017-07-29 DIAGNOSIS — F411 Generalized anxiety disorder: Secondary | ICD-10-CM | POA: Diagnosis not present

## 2017-07-29 DIAGNOSIS — M791 Myalgia, unspecified site: Secondary | ICD-10-CM | POA: Diagnosis not present

## 2017-07-29 DIAGNOSIS — E039 Hypothyroidism, unspecified: Secondary | ICD-10-CM | POA: Diagnosis not present

## 2017-07-29 DIAGNOSIS — R531 Weakness: Secondary | ICD-10-CM | POA: Diagnosis not present

## 2017-07-29 DIAGNOSIS — F319 Bipolar disorder, unspecified: Secondary | ICD-10-CM | POA: Diagnosis not present

## 2017-07-29 DIAGNOSIS — M6281 Muscle weakness (generalized): Secondary | ICD-10-CM | POA: Diagnosis not present

## 2017-07-29 DIAGNOSIS — R279 Unspecified lack of coordination: Secondary | ICD-10-CM | POA: Diagnosis not present

## 2017-07-29 DIAGNOSIS — I480 Paroxysmal atrial fibrillation: Secondary | ICD-10-CM | POA: Diagnosis not present

## 2017-07-29 DIAGNOSIS — Z515 Encounter for palliative care: Secondary | ICD-10-CM | POA: Diagnosis not present

## 2017-07-29 DIAGNOSIS — R410 Disorientation, unspecified: Secondary | ICD-10-CM | POA: Diagnosis not present

## 2017-07-29 DIAGNOSIS — R002 Palpitations: Secondary | ICD-10-CM | POA: Diagnosis not present

## 2017-07-29 DIAGNOSIS — F3112 Bipolar disorder, current episode manic without psychotic features, moderate: Secondary | ICD-10-CM | POA: Diagnosis not present

## 2017-07-29 DIAGNOSIS — G8929 Other chronic pain: Secondary | ICD-10-CM | POA: Diagnosis not present

## 2017-07-29 DIAGNOSIS — I340 Nonrheumatic mitral (valve) insufficiency: Secondary | ICD-10-CM | POA: Diagnosis not present

## 2017-07-29 DIAGNOSIS — R7989 Other specified abnormal findings of blood chemistry: Secondary | ICD-10-CM | POA: Diagnosis not present

## 2017-07-29 DIAGNOSIS — C7951 Secondary malignant neoplasm of bone: Secondary | ICD-10-CM | POA: Diagnosis not present

## 2017-07-29 DIAGNOSIS — R41841 Cognitive communication deficit: Secondary | ICD-10-CM | POA: Diagnosis not present

## 2017-07-29 DIAGNOSIS — R05 Cough: Secondary | ICD-10-CM | POA: Diagnosis not present

## 2017-07-29 DIAGNOSIS — G20 Parkinson's disease: Secondary | ICD-10-CM | POA: Diagnosis not present

## 2017-07-29 DIAGNOSIS — R339 Retention of urine, unspecified: Secondary | ICD-10-CM | POA: Diagnosis not present

## 2017-07-29 DIAGNOSIS — G8911 Acute pain due to trauma: Secondary | ICD-10-CM | POA: Diagnosis not present

## 2017-07-29 DIAGNOSIS — Z9289 Personal history of other medical treatment: Secondary | ICD-10-CM | POA: Diagnosis not present

## 2017-07-29 DIAGNOSIS — S32010D Wedge compression fracture of first lumbar vertebra, subsequent encounter for fracture with routine healing: Secondary | ICD-10-CM | POA: Diagnosis not present

## 2017-07-29 DIAGNOSIS — K589 Irritable bowel syndrome without diarrhea: Secondary | ICD-10-CM | POA: Diagnosis not present

## 2017-07-29 DIAGNOSIS — N390 Urinary tract infection, site not specified: Secondary | ICD-10-CM | POA: Diagnosis not present

## 2017-07-29 DIAGNOSIS — C3492 Malignant neoplasm of unspecified part of left bronchus or lung: Secondary | ICD-10-CM | POA: Diagnosis not present

## 2017-07-29 DIAGNOSIS — R0602 Shortness of breath: Secondary | ICD-10-CM | POA: Diagnosis not present

## 2017-07-29 LAB — BASIC METABOLIC PANEL
ANION GAP: 9 (ref 5–15)
BUN: 10 mg/dL (ref 6–20)
CALCIUM: 8.4 mg/dL — AB (ref 8.9–10.3)
CO2: 27 mmol/L (ref 22–32)
CREATININE: 0.77 mg/dL (ref 0.44–1.00)
Chloride: 99 mmol/L — ABNORMAL LOW (ref 101–111)
GFR calc Af Amer: >60 mL/min (ref >60)
GFR calc non Af Amer: >60 mL/min (ref >60)
Glucose, Bld: 90 mg/dL (ref 65–99)
Potassium: 3.9 mmol/L (ref 3.5–5.1)
Sodium: 135 mmol/L (ref 135–145)

## 2017-07-29 LAB — MAGNESIUM: Magnesium: 1.7 mg/dL (ref 1.7–2.4)

## 2017-07-29 MED ORDER — MAGNESIUM OXIDE 400 (241.3 MG) MG PO TABS: 800.0000 mg | ORAL_TABLET | Freq: Two times a day (BID) | ORAL | 0 refills | Status: AC

## 2017-07-29 MED ORDER — LEVOTHYROXINE SODIUM 25 MCG PO TABS: 12.5000 ug | ORAL_TABLET | Freq: Every day | ORAL | Status: AC

## 2017-07-29 MED ORDER — DULOXETINE HCL 30 MG PO CPEP: 30.0000 mg | ORAL_CAPSULE | Freq: Two times a day (BID) | ORAL | 0 refills | Status: AC

## 2017-07-29 MED ORDER — MAGNESIUM OXIDE 400 (241.3 MG) MG PO TABS
800.0000 mg | ORAL_TABLET | Freq: Two times a day (BID) | ORAL | Status: DC
  Administered 2017-07-29: 800 mg via ORAL
  Filled 2017-07-29: qty 2

## 2017-07-29 MED ORDER — DULOXETINE HCL 30 MG PO CPEP: ORAL_CAPSULE | ORAL | 0 refills | Status: DC

## 2017-07-29 MED ORDER — DILTIAZEM HCL 30 MG PO TABS: 30.0000 mg | ORAL_TABLET | Freq: Three times a day (TID) | ORAL | Status: AC

## 2017-07-29 MED ORDER — ALPRAZOLAM 0.5 MG PO TABS: 0.2500 mg | ORAL_TABLET | Freq: Two times a day (BID) | ORAL | 0 refills | Status: AC | PRN

## 2017-07-29 NOTE — NC FL2 (Signed)
[place] LEVEL OF CARE SCREENING TOOL     IDENTIFICATION  Patient Name: [name] Birthdate: 05-18-1927 Sex: female Admission Date (Current Location): 07/21/2017  [facility] and [place] Number:  Herbalist and Address:  [facility], [place] [address], [place], [place] [address]      Provider Number: [id number]  Attending Physician Name and Address:  Nita Sells, MD  Relative Name and Phone Number:       Current Level of Care: Hospital Recommended Level of Care: Skilled Nursing Facility(with palliative) Prior Approval Number:    Date Approved/Denied:   PASRR Number: [id number]  Discharge Plan: SNF(with palliative)    Current Diagnoses: Patient Active Problem List   Diagnosis Date Noted  . DNR (do not resuscitate) discussion   . Palliative care by specialist   . Atrial fibrillation with RVR ([place]) 07/22/2017  . Fall 07/22/2017  . Compression fracture of L1 lumbar vertebra ([place]) 07/22/2017  . Large cell carcinoma of left lung, stage 4 ([place]) 04/24/2017  . Adenocarcinoma of left lung metastatic to liver ([place]) 04/24/2017  . Lung cancer metastatic to bone ([place]) 04/24/2017  . Mass of lower lobe of right lung 03/25/2017  . Melanoma of skin ([place]) 12/06/2016  . Hypokalemia 08/25/2016  . Failure to thrive in adult 04/01/2016  . Blurry vision, right eye   . PCP NOTES >>>>>>>>>>>>>>>>>>>>>>> 03/02/2015  . COPD, group B, by GOLD 2017 classification ([place]) 01/31/2015  . Other fatigue 06/21/2014  . Lightheadedness 06/12/2014  . COPD (chronic obstructive pulmonary disease) ([place]) 09/26/2013  . IBS (irritable bowel syndrome) 04/02/2012  . Goals of care, counseling/discussion 11/25/2011  . Anxiety 07/10/2011  . Parkinson's disease ([place]) 02/03/2011  . Recurrent UTI 11/01/2010  . GERD (gastroesophageal reflux disease) 11/01/2010  . Memory loss 11/01/2010  . Hip pain 09/23/2010  . COMPRESSION FRACTURE, LUMBAR VERTEBRAE  03/28/2010  . Atrial fibrillation ([place]) 03/22/2010  . DIZZINESS 03/22/2010  . Mycotic toenails 10/02/2009  . DYSPNEA ON EXERTION 01/19/2009  . HEMORRHOIDS-INTERNAL 02/14/2008  . Constipation 02/14/2008  . PERSONAL HX COLONIC POLYPS 02/14/2008    Orientation RESPIRATION BLADDER Height & Weight     Self, Situation, Place  Room air Continent. Internal catheter. Weight: 158 lb (71.7 kg) Height:  5\' 10"  (177.8 cm)  BEHAVIORAL SYMPTOMS/MOOD NEUROLOGICAL BOWEL NUTRITION STATUS  (None) (Parkinson's) Continent Diet(Heart healthy)  AMBULATORY STATUS COMMUNICATION OF NEEDS Skin   Limited Assist Verbally Skin abrasions, Bruising, MASD.                       Personal Care Assistance Level of Assistance  Bathing, Feeding, Dressing Bathing Assistance: Limited assistance Feeding assistance: Limited assistance Dressing Assistance: Limited assistance     Functional Limitations Info  Sight, Hearing, Speech Sight Info: Adequate Hearing Info: Adequate Speech Info: Adequate    SPECIAL CARE FACTORS FREQUENCY  PT (By licensed PT), OT (By licensed OT)     PT Frequency: 5 x week OT Frequency: 5 x week            Contractures Contractures Info: Not present    Additional Factors Info  Code Status, Allergies, Psychotropic Code Status Info: DNR Allergies Info: Adhesive (Tape), Amoxicillin, Ciprofloxacin, Ultram (Tramadol). Psychotropic Info: Anxiety: Cymbalta DR 30 mg PO BID, Xanax 0.25 mg PO BID prn.         Current Medications (07/29/2017):  This is the current hospital active medication list Current Facility-Administered Medications  Medication Dose Route Frequency Provider Last  Rate Last Dose  . 0.9 %  sodium chloride infusion  250 mL Intravenous Continuous Nigel Mormon, MD   Stopped at 07/28/17 0849  . acetaminophen (TYLENOL) tablet 650 mg  650 mg Oral Q6H PRN Allie Bossier, MD      . ALPRAZolam Duanne Moron) tablet 0.25 mg  0.25 mg Oral BID Nita Sells, MD   0.25  mg at 07/29/17 0939  . apixaban (ELIQUIS) tablet 5 mg  5 mg Oral BID Allie Bossier, MD   5 mg at 07/29/17 8413  . azelastine (ASTELIN) 0.1 % nasal spray 2 spray  2 spray Each Nare QHS PRN Allie Bossier, MD      . cholecalciferol (VITAMIN D) tablet 1,000 Units  1,000 Units Oral Daily Allie Bossier, MD   1,000 Units at 07/29/17 [id number]  . diltiazem (CARDIZEM) tablet 30 mg  30 mg Oral Q8H Patwardhan, Manish J, MD   30 mg at 07/29/17 1027  . DULoxetine (CYMBALTA) DR capsule 30 mg  30 mg Oral BID Allie Bossier, MD   30 mg at 07/29/17 2536  . folic acid (FOLVITE) tablet 1 mg  1 mg Oral Daily Allie Bossier, MD   1 mg at 07/29/17 6440  . hydrALAZINE (APRESOLINE) injection 5 mg  5 mg Intravenous Q2H PRN Allie Bossier, MD      . ipratropium-albuterol (DUONEB) 0.5-2.5 (3) MG/3ML nebulizer solution 3 mL  3 mL Nebulization Q6H PRN Hongalgi, Anand D, MD      . levothyroxine (SYNTHROID, LEVOTHROID) tablet 12.5 mcg  12.5 mcg Oral QAC breakfast Modena Jansky, MD   12.5 mcg at 07/29/17 [phone]  . magnesium oxide (MAG-OX) tablet 800 mg  800 mg Oral BID Nita Sells, MD   800 mg at 07/29/17 0939  . methocarbamol (ROBAXIN) tablet 500 mg  500 mg Oral Q8H PRN Allie Bossier, MD      . morphine 4 MG/ML injection 1 mg  1 mg Intravenous Q4H PRN Allie Bossier, MD      . ondansetron [facility]) tablet 4 mg  4 mg Oral Q6H PRN Allie Bossier, MD       Or  . ondansetron [facility]) injection 4 mg  4 mg Intravenous Q6H PRN Allie Bossier, MD      . oxyCODONE-acetaminophen (PERCOCET/ROXICET) 5-325 MG per tablet 1 tablet  1 tablet Oral Q4H PRN Allie Bossier, MD      . polyethylene glycol (MIRALAX / GLYCOLAX) packet 17 g  17 g Oral BID Allie Bossier, MD   Stopped at 07/29/17 0940  . polyvinyl alcohol (LIQUIFILM TEARS) 1.4 % ophthalmic solution 1 drop  1 drop Both Eyes TID PRN Allie Bossier, MD      . senna [facility]) tablet 17.2 mg  2 tablet Oral BID Modena Jansky, MD   17.2 mg at 07/28/17 2110  . sodium  chloride flush (NS) 0.9 % injection 3 mL  3 mL Intravenous Q12H Patwardhan, Manish J, MD   3 mL at 07/28/17 2112  . sodium chloride flush (NS) 0.9 % injection 3 mL  3 mL Intravenous PRN Patwardhan, Manish J, MD      . vitamin C (ASCORBIC ACID) tablet 1,000 mg  1,000 mg Oral Daily Allie Bossier, MD   1,000 mg at 07/29/17 2595  . zolpidem (AMBIEN) tablet 5 mg  5 mg Oral QHS PRN Allie Bossier, MD       Facility-Administered Medications Ordered in Other Encounters  Medication Dose Route Frequency Provider Last Rate Last Dose  . cyanocobalamin ((VITAMIN B-12)) injection 1,000 mcg  1,000 mcg Intramuscular Once [place], Holli Humbles, NP         Discharge Medications: Please see discharge summary for a list of discharge medications.  Relevant Imaging Results:  Relevant Lab Results:   Additional Information SS#: [id number]  Candie Chroman, LCSW

## 2017-07-29 NOTE — Clinical Social Work Note (Addendum)
[facility] admissions coordinator will notify CSW after their morning meetings if they have beds available today.  Dayton Scrape, [place] [phone]  9:58 am [facility] will not have a bed today. Still waiting to hear from [facility].  Dayton Scrape, CSW [phone]  12:04 pm [facility] can take patient today but need her daughter to complete paperwork first. CSW tried calling but voicemail is full. Will try again later if no call back within the next 30 minutes-1 hour.  Dayton Scrape, CSW [phone]  12:24 pm Patient's daughter prefers if SNF faxes admissions paperwork to CSW to complete here at the hospital since she does not have a vehicle here. CSW left message for admissions coordinator with fax number. Awaiting response.  Dayton Scrape, [place] [phone]  1:00 pm SNF will fax admissions paperwork to CSW for daughter to complete here at the hospital.  Dayton Scrape, [place]  3:29 pm Paperwork completed and faxed back to the facility. Admissions coordinator will notify CSW when she has received it.  Dayton Scrape, [place]

## 2017-07-29 NOTE — Discharge Summary (Signed)
Physician Discharge Summary  [name] OJJ:[id number] DOB: 1927-07-30 DOA: 07/21/2017  PCP: Mackie Pai, PA-C  Admit date: 07/21/2017 Discharge date: 07/29/2017  Time spent: 35 minutes  Recommendations for Outpatient Follow-up:  1. cardizem to 60 q 8 2. rx for Xanax, cymbalta 3. New med of Synthroid 25 mcg qd-check tsh + t4 3 weeks 4. New med of Mag Ox 800 bid-check Chem-12 + mag 3 days 5. Patient will need an indwelling Foley catheter in addition to voiding trials in about 2 weeks at the facility, and follow-up with Dr. Al[facility] of [facility] to be set up by skilled nursing physician as an outpatient if she does not void independently without Foley  Discharge Diagnoses:  Principal Problem:   Anxiety Active Problems:   Constipation   Goals of care, counseling/discussion   COPD (chronic obstructive pulmonary disease) ([place])   Large cell carcinoma of left lung, stage 4 (HCC)   Adenocarcinoma of left lung metastatic to liver [facility])   Lung cancer metastatic to bone [facility])   Atrial fibrillation with RVR ([place])   Fall   Compression fracture of L1 lumbar vertebra ([place])   DNR (do not resuscitate) discussion   Palliative care by specialist   Discharge Condition: Improved  Diet recommendation: Heart healthy  Filed Weights   07/21/17 1820  Weight: 71.7 kg (158 lb)    History of present illness:   84 year old female A. fib chads score >3/Eliquis, HLD, reflux, bipolar, met stage IV NSCLC admitted with constipation no BM abdominal distention fall 07/22/2017 A. fib RVR noted-cardioverted-see below  Hospital Course:   A. fib RVR-Dr. Einar Gip cardiology consulted discharging on Cardizem 3 times daily 60-has been maintaining sinus rhythm since DC CV Check TSH as an outpatient  Elevated troponin   Fall at home with significant orthostasis causing the same-CT head x-ray left humerus  neg-we will need close management as an outpatient  Bipolar-seen by psychiatry because of  anxiety and Cymbalta increased to 30 twice a day Xanax short-term 0.25  Stage IV NSCLC responded well to treatment last PET scan in remission-needs outpatient follow-up Dr. Marin Olp  Chronic pain with stable L1 compression fracture  Hypothyroid TSH was 35 T4 was 0.4 and her Synthroid was changed on discharge to 12.5 mcg  Acute urinary retention Foley catheter needed to be placed 4/5 and needed to be reinserted-voiding trial was done patient was discussed with Dr. Alinda Money 4/8 and may need to continue Foley catheter at skilled facility anterior abdominal wall hernia probably is the cause  Procedures:  Echo  DCCV   Consultations:  Cardiology  Discharge Exam: Vitals:   07/29/17 0700 07/29/17 1151  BP: 118/69 102/75  Pulse: 73 78  Resp: (!) 21 (!) 21  Temp: 98.9 F (37.2 C) 98.6 F (37 C)  SpO2: 91% 97%    General: awake alert pleasant in nad Cardiovascular: s1 s2 no m/r/g Respiratory: clear no added no tvr tvf  Discharge Instructions   Discharge Instructions    Diet - low sodium heart healthy   Complete by:  As directed    Increase activity slowly   Complete by:  As directed      Allergies as of 07/29/2017      Reactions   Adhesive [tape] Other (See Comments)   Caused blisters   Amoxicillin Other (See Comments)   Has patient had a PCN reaction causing immediate rash, facial/tongue/throat swelling, SOB or lightheadedness with hypotension: Yes Has patient had a PCN reaction causing severe rash involving mucus membranes  or skin necrosis: No Has patient had a PCN reaction that required hospitalization No Has patient had a PCN reaction occurring within the last 10 years: Yes If all of the above answers are "NO", then may proceed with Cephalosporin use. Caused thrush   Ciprofloxacin Other (See Comments)   Caused thrush   Ultram [tramadol] Other (See Comments)   hypotension      Medication List    STOP taking these medications   dexamethasone 4 MG tablet Commonly  known as:  DECADRON   prochlorperazine 10 MG tablet Commonly known as:  COMPAZINE     TAKE these medications   acetaminophen 325 MG tablet Commonly known as:  TYLENOL Take 2 tablets (650 mg total) by mouth every 6 (six) hours as needed for mild pain (or Fever >/= 101).   albuterol 108 (90 Base) MCG/ACT inhaler Commonly known as:  VENTOLIN HFA Inhale 2 puffs into the lungs 2 (two) times daily as needed for wheezing or shortness of breath.   ALPRAZolam 0.5 MG tablet Commonly known as:  XANAX Take 0.5-1 tablets (0.25-0.5 mg total) by mouth 2 (two) times daily as needed for anxiety.   apixaban 5 MG Tabs tablet Commonly known as:  ELIQUIS Take 5 mg by mouth 2 (two) times daily.   azelastine 0.1 % nasal spray Commonly known as:  ASTELIN Place 2 sprays into both nostrils at bedtime as needed for rhinitis. Use in each nostril as directed   diltiazem 30 MG tablet Commonly known as:  CARDIZEM Take 1 tablet (30 mg total) by mouth 3 (three) times daily.   DULoxetine 30 MG capsule Commonly known as:  CYMBALTA Take 1 capsule (30 mg total) by mouth 2 (two) times daily. What changed:  See the new instructions.   folic acid 1 MG tablet Commonly known as:  FOLVITE Take 1 tablet (1 mg total) by mouth daily. Take daily starting 5-7 days before Alimta chemo. Continue until 21 days after Alimta completed.   lactulose 10 GM/15ML solution Commonly known as:  CHRONULAC Take 15 mLs (10 g total) by mouth every 4 (four) hours as needed for mild constipation. What changed:  when to take this   levothyroxine 25 MCG tablet Commonly known as:  SYNTHROID, LEVOTHROID Take 0.5 tablets (12.5 mcg total) by mouth daily before breakfast. Start taking on:  07/30/2017   magnesium oxide 400 (241.3 Mg) MG tablet Commonly known as:  MAG-OX Take 2 tablets (800 mg total) by mouth 2 (two) times daily.   ondansetron 8 MG tablet Commonly known as:  ZOFRAN Take 1 tablet (8 mg total) by mouth every 8 (eight)  hours as needed for nausea or vomiting.   polyethylene glycol powder powder Commonly known as:  GLYCOLAX/MIRALAX Take 17 g by mouth 2 (two) times daily. What changed:    when to take this  reasons to take this   SYSTANE BALANCE OP Apply 2 drops to eye 3 (three) times daily as needed (for dry/irritated eyes).   vitamin C 1000 MG tablet Take 1,000 mg by mouth daily.   Vitamin D 400 UNIT/ML Liqd Take 400 Units by mouth daily.      Allergies  Allergen Reactions  . Adhesive [Tape] Other (See Comments)    Caused blisters  . Amoxicillin Other (See Comments)    Has patient had a PCN reaction causing immediate rash, facial/tongue/throat swelling, SOB or lightheadedness with hypotension: Yes Has patient had a PCN reaction causing severe rash involving mucus membranes or skin necrosis: No Has  patient had a PCN reaction that required hospitalization No Has patient had a PCN reaction occurring within the last 10 years: Yes If all of the above answers are "NO", then may proceed with Cephalosporin use.   Caused thrush  . Ciprofloxacin Other (See Comments)    Caused thrush  . Ultram [Tramadol] Other (See Comments)    hypotension   Contact information for after-discharge care    Destination    [facility] .   Service:  Skilled Nursing Contact information: [place] [phone]               The results of significant diagnostics from this hospitalization (including imaging, microbiology, ancillary and laboratory) are listed below for reference.    Significant Diagnostic Studies: Dg Chest 2 View  Result Date: 07/01/2017 CLINICAL DATA:  Shortness of breath. History of COPD and lung cancer. EXAM: CHEST - 2 VIEW COMPARISON:  PET-CT 06/11/2017.  Chest radiographs 03/16/2017. FINDINGS: The cardiac silhouette is mildly enlarged. Aortic atherosclerosis is noted. The lungs are hyperinflated consistent with history  of COPD. Small nodular density in the right infrahilar region corresponds to the 1.4 cm nodule on PET-CT, substantially decreased in size compared to the older radiographs. There is minimal left basilar atelectasis or scarring. No pleural effusion or pneumothorax is identified. Thoracic dextroscoliosis is noted. IMPRESSION: COPD and known right lower lobe lung cancer. No evidence of acute cardiopulmonary process. Electronically Signed   By: Logan Bores M.D.   On: 07/01/2017 10:41   Dg Lumbar Spine Complete  Result Date: 07/21/2017 CLINICAL DATA:  Low back pain EXAM: LUMBAR SPINE - COMPLETE 4+ VIEW COMPARISON:  05/28/2016 FINDINGS: Five lumbar type vertebral bodies are well visualized. Mild scoliosis concave to the right is noted. L1 compression deformity is noted stable from the prior CT examination. Multilevel disc space narrowing and osteophytic changes are seen. The overlying soft tissues demonstrate changes of constipation. These are similar to that seen on recent PET-CT. IMPRESSION: Stable L1 compression deformity. Multilevel degenerative change without acute abnormality. Electronically Signed   By: Inez Catalina M.D.   On: 07/21/2017 19:40   Dg Abd 1 View  Result Date: 07/24/2017 CLINICAL DATA:  Follow-up ileus EXAM: ABDOMEN - 1 VIEW COMPARISON:  07/22/2017 FINDINGS: Scattered large and small bowel gas is noted. Fecal material remains within the left colon. No obstructive changes are seen. No free air is seen. Chronic degenerative changes of the lumbar spine are noted. IMPRESSION: Scattered large and small bowel gas. No obstructive changes are noted. Some retained fecal material is again seen. Electronically Signed   By: Inez Catalina M.D.   On: 07/24/2017 12:50   Dg Abd 1 View  Result Date: 07/13/2017 CLINICAL DATA:  Constipation for 7 days, lower pelvic discomfort, history of LEFT lung cancer metastatic to liver, recent chemotherapy, history of bowel obstruction, COPD, GERD, hypertension,  Parkinson's EXAM: ABDOMEN - 1 VIEW COMPARISON:  PET-CT 06/11/2017 FINDINGS: Markedly increased stool in rectum. Scattered stool and gas throughout remainder of colon. Small bowel gas pattern normal. No bowel dilatation or bowel wall thickening. Minimal atelectasis at LEFT base with RIGHT lung base clear. Osseous demineralization with degenerative changes and levoconvex scoliosis of lumbar spine. IMPRESSION: Markedly increased stool in rectum. Electronically Signed   By: Lavonia Dana M.D.   On: 07/13/2017 15:30   Ct Head Wo Contrast  Result Date: 07/21/2017 CLINICAL DATA:  The patient fell off a bed today. Weakness. Initial encounter. EXAM: CT HEAD  WITHOUT CONTRAST CT CERVICAL SPINE WITHOUT CONTRAST TECHNIQUE: Multidetector CT imaging of the head and cervical spine was performed following the standard protocol without intravenous contrast. Multiplanar CT image reconstructions of the cervical spine were also generated. COMPARISON:  Head CT scan 03/16/2017.  Brain MRI 04/16/2017. FINDINGS: CT HEAD FINDINGS Brain: No evidence of acute infarction, hemorrhage, hydrocephalus, extra-axial collection or mass lesion/mass effect. Cortical atrophy is identified. Extensive hypoattenuation in the subcortical and periventricular deep white matter consistent chronic microvascular ischemic change is also seen. Vascular: No hyperdense vessel or unexpected calcification. Skull: Intact.  No focal lesion. Sinuses/Orbits: No acute abnormality.  Status post lens extraction. Other: None. CT CERVICAL SPINE FINDINGS Alignment: 0.2 cm facet mediated anterolisthesis C3 on C4 is identified. Otherwise maintained. Skull base and vertebrae: No acute fracture. No primary bone lesion or focal pathologic process. Soft tissues and spinal canal: No prevertebral fluid or swelling. No visible canal hematoma. Disc levels: Marked loss of disc space height is seen from C3 to C7. Advanced multilevel facet arthropathy is also noted. The facet joints are  ankylosed bilaterally at C2-3 and C3-4. Upper chest: Clear. Other: None. IMPRESSION: No acute abnormality head or cervical spine. Atrophy and extensive chronic microvascular ischemic change. Cervical spondylosis. Electronically Signed   By: Inge Rise M.D.   On: 07/21/2017 19:14   Ct Cervical Spine Wo Contrast  Result Date: 07/21/2017 CLINICAL DATA:  The patient fell off a bed today. Weakness. Initial encounter. EXAM: CT HEAD WITHOUT CONTRAST CT CERVICAL SPINE WITHOUT CONTRAST TECHNIQUE: Multidetector CT imaging of the head and cervical spine was performed following the standard protocol without intravenous contrast. Multiplanar CT image reconstructions of the cervical spine were also generated. COMPARISON:  Head CT scan 03/16/2017.  Brain MRI 04/16/2017. FINDINGS: CT HEAD FINDINGS Brain: No evidence of acute infarction, hemorrhage, hydrocephalus, extra-axial collection or mass lesion/mass effect. Cortical atrophy is identified. Extensive hypoattenuation in the subcortical and periventricular deep white matter consistent chronic microvascular ischemic change is also seen. Vascular: No hyperdense vessel or unexpected calcification. Skull: Intact.  No focal lesion. Sinuses/Orbits: No acute abnormality.  Status post lens extraction. Other: None. CT CERVICAL SPINE FINDINGS Alignment: 0.2 cm facet mediated anterolisthesis C3 on C4 is identified. Otherwise maintained. Skull base and vertebrae: No acute fracture. No primary bone lesion or focal pathologic process. Soft tissues and spinal canal: No prevertebral fluid or swelling. No visible canal hematoma. Disc levels: Marked loss of disc space height is seen from C3 to C7. Advanced multilevel facet arthropathy is also noted. The facet joints are ankylosed bilaterally at C2-3 and C3-4. Upper chest: Clear. Other: None. IMPRESSION: No acute abnormality head or cervical spine. Atrophy and extensive chronic microvascular ischemic change. Cervical spondylosis.  Electronically Signed   By: Inge Rise M.D.   On: 07/21/2017 19:14   Ct Abdomen Pelvis W Contrast  Result Date: 07/22/2017 CLINICAL DATA:  Low back pain, abdominal pain, and constipation for 3 weeks. History of lung cancer metastatic to bone and liver. EXAM: CT ABDOMEN AND PELVIS WITH CONTRAST TECHNIQUE: Multidetector CT imaging of the abdomen and pelvis was performed using the standard protocol following bolus administration of intravenous contrast. CONTRAST:  179m ISOVUE-300 IOPAMIDOL (ISOVUE-300) INJECTION 61% COMPARISON:  05/28/2016 FINDINGS: Lower chest: Atelectasis or consolidation in the lung bases. Minimal bilateral pleural effusions. Emphysematous changes in the lungs. Small esophageal hiatal hernia. Distal esophageal wall thickening. This is likely due to reflux disease but could also indicate esophagitis or neoplasm. Hepatobiliary: No focal liver lesions identified. Gallbladder is unremarkable. Mild  extrahepatic bile duct dilatation without change. No obstructing lesion identified. Pancreas: Unremarkable. No pancreatic ductal dilatation or surrounding inflammatory changes. Spleen: Normal in size without focal abnormality. Adrenals/Urinary Tract: No adrenal gland nodules. Pelvic left kidney. Renal nephrograms are symmetrical. No hydronephrosis. Bladder is over distended, possibly indicating urinary retention. No wall thickening or filling defects. Stomach/Bowel: Stomach is decompressed. Small bowel are mostly decompressed. Surgical anastomosis in the left lower quadrant small bowel. Colon is diffusely stool-filled. There is a midline anterior abdominal wall hernia containing transverse colon and small bowel. No proximal dilatation to suggest obstruction. Appendix is surgically absent. Vascular/Lymphatic: Tortuous and calcified aorta. No aneurysm. No significant lymphadenopathy. Reproductive: Uterus and bilateral adnexa are unremarkable. Other: No free air in the abdomen. Free fluid in the pelvis  and presacral space is decreased since previous study. No loculated fluid collections. Musculoskeletal: Degenerative changes in the spine and hips. No destructive bone lesions. Compression of the L1 vertebra without change. Multilevel degenerative disc disease. IMPRESSION: 1. No definite acute changes identified. 2. Moderate anterior abdominal wall hernia containing small bowel and transverse colon. No obvious obstruction. Colon is diffusely stool-filled. 3. Mild extrahepatic bile duct dilatation without change. 4. Possible urinary retention. No bladder wall thickening or filling defects. 5. Small bilateral pleural effusions with basilar atelectasis or consolidation. 6. Nonspecific esophageal wall thickening. 7. Aortic atherosclerosis. 8. Small amount of free fluid in the presacral space and low pelvis is decreased since previous study. 9. Pelvic left kidney.  No evidence of renal obstruction. Electronically Signed   By: Lucienne Capers M.D.   On: 07/22/2017 04:40   Nm Pet Image Restag (ps) Skull Base To Thigh  Result Date: 07/20/2017 CLINICAL DATA:  Subsequent treatment strategy for lung cancer. EXAM: NUCLEAR MEDICINE PET SKULL BASE TO THIGH TECHNIQUE: 7.5 mCi F-18 FDG was injected intravenously. Full-ring PET imaging was performed from the skull base to thigh after the radiotracer. CT data was obtained and used for attenuation correction and anatomic localization. Fasting blood glucose: 101 mg/dl COMPARISON:  06/11/2017. FINDINGS: Mediastinal blood pool activity: SUV max 1.7 NECK: No hypermetabolic lymph nodes. Incidental CT findings: None. CHEST: No hypermetabolic mediastinal, hilar or axillary lymph nodes. No hypermetabolic pulmonary nodules. Specifically, a 0.9 x 1.4 cm nodule in the medial right lower lobe does not show metabolism above blood pool. Incidental CT findings: Atherosclerotic calcification of the arterial vasculature, including coronary arteries. Heart is enlarged. No pericardial effusion.  There may be trace right pleural fluid. ABDOMEN/PELVIS: No abnormal hypermetabolism in the liver, adrenal glands, spleen or pancreas. No hypermetabolic lymph nodes. Incidental CT findings: None. SKELETON: No abnormal osseous hypermetabolism. Incidental CT findings: Degenerative changes in the spine and hips. IMPRESSION: 1. No residual abnormal hypermetabolism in the neck, chest, abdomen or pelvis. Residual nodule in the medial right lower lobe does not show hypermetabolism above blood pool. 2. Probable trace right pleural fluid. 3.  Aortic atherosclerosis (ICD10-170.0). Electronically Signed   By: Lorin Picket M.D.   On: 07/20/2017 14:06   Dg Abd Acute W/chest  Result Date: 07/21/2017 CLINICAL DATA:  Abdominal pain and distension EXAM: DG ABDOMEN ACUTE W/ 1V CHEST COMPARISON:  07/13/2017 FINDINGS: Cardiac shadow is mildly enlarged. Aortic calcifications are noted. Mild left basilar atelectasis is seen. No focal infiltrate is noted. Scattered large and small bowel gas is seen. No free air is noted. Retained fecal material is noted consistent with a degree of constipation. No definitive obstructive changes are seen. IMPRESSION: Changes of constipation without obstruction. No other focal abnormality is noted.  Electronically Signed   By: Inez Catalina M.D.   On: 07/21/2017 19:38   Dg Humerus Left  Result Date: 07/22/2017 CLINICAL DATA:  Fall.  Bruising left mid humerus. EXAM: LEFT HUMERUS - 2+ VIEW COMPARISON:  None. FINDINGS: Degenerative changes in the left shoulder and left elbow. No evidence of acute fracture or dislocation. No focal bone lesion or bone destruction. Soft tissues are unremarkable. IMPRESSION: Degenerative changes in the left shoulder and elbow. No acute bony abnormalities. Electronically Signed   By: Lucienne Capers M.D.   On: 07/22/2017 04:48   Dg Hips Bilat W Or Wo Pelvis 3-4 Views  Result Date: 07/21/2017 CLINICAL DATA:  Pelvic pain EXAM: DG HIP (WITH OR WITHOUT PELVIS) 4V BILAT  COMPARISON:  None. FINDINGS: Pelvic ring is intact. Degenerative changes of the hip joints are noted bilaterally. No acute fracture or dislocation is seen. No gross soft tissue abnormality is noted. Degenerative changes of the pubic symphysis are seen. IMPRESSION: Degenerative change without acute abnormality. Electronically Signed   By: Inez Catalina M.D.   On: 07/21/2017 19:42    Microbiology: Recent Results (from the past 240 hour(s))  MRSA PCR Screening     Status: None   Collection Time: 07/23/17 12:07 AM  Result Value Ref Range Status   MRSA by PCR NEGATIVE NEGATIVE Final    Comment:        The GeneXpert MRSA Assay (FDA approved for NASAL specimens only), is one component of a comprehensive MRSA colonization surveillance program. It is not intended to diagnose MRSA infection nor to guide or monitor treatment for MRSA infections. Performed at [facility], [place] [address] [place], [place] [address]      Labs: Basic Metabolic Panel: Recent Labs  Lab 07/23/17 0745 07/24/17 0240 07/25/17 0241 07/27/17 0237 07/28/17 0548 07/29/17 0835  NA 136 134* 133* 133* 133* 135  K 3.1* 3.7 4.6 3.8 3.5 3.9  CL 105 103 103 99* 101 99*  CO2 22 21* 21* '25 24 27  '$ GLUCOSE 81 104* 83 96 90 90  BUN '11 8 12 14 12 10  '$ CREATININE 0.67 0.69 0.70 0.82 0.70 0.77  CALCIUM 7.9* 8.2* 8.3* 8.5* 8.3* 8.4*  MG 1.6* 1.7 1.8  --  1.4* 1.7  PHOS 2.6 2.4* 2.2* 3.6  --   --    Liver Function Tests: Recent Labs  Lab 07/27/17 0237  ALBUMIN 2.2*   No results for input(s): LIPASE, AMYLASE in the last 168 hours. Recent Labs  Lab 07/23/17 0745 07/24/17 0240 07/25/17 0241  AMMONIA '26 30 30   '$ CBC: Recent Labs  Lab 07/23/17 0745 07/24/17 0240 07/25/17 0241 07/27/17 0237  WBC 4.2 4.4 3.5* 4.8  HGB 8.5* 9.1* 8.9* 9.5*  HCT 25.7* 28.2* 26.9* 29.4*  MCV 99.6 97.9 99.3 98.0  PLT 134* 180 212 353   Cardiac Enzymes: Recent Labs  Lab 07/22/17 1855 07/23/17 1851 07/24/17 0240  TROPONINI  0.07* 0.04* 0.04*   BNP: BNP (last 3 results) No results for input(s): BNP in the last 8760 hours.  ProBNP (last 3 results) No results for input(s): PROBNP in the last 8760 hours.  CBG: No results for input(s): GLUCAP in the last 168 hours.     Signed:  Nita Sells MD   [facility] 07/29/2017, 2:37 PM

## 2017-07-29 NOTE — Clinical Social Work Placement (Signed)
   CLINICAL SOCIAL WORK PLACEMENT  NOTE  Date:  07/29/2017  Patient Details  Name: [name] MRN: [id number] Date of Birth: [birth year]  Clinical Social Work is seeking post-discharge placement for this patient at the [place] level of care (*CSW will initial, date and re-position this form in  chart as items are completed):  Yes   Patient/family provided with [facility] Work Department's list of facilities offering this level of care within the geographic area requested by the patient (or if unable, by the patient's family).  Yes   Patient/family informed of their freedom to choose among providers that offer the needed level of care, that participate in Medicare, Medicaid or managed care program needed by the patient, have an available bed and are willing to accept the patient.  Yes   Patient/family informed of [facility]'s ownership interest in [facility] and [facility], as well as of the fact that they are under no obligation to receive care at these facilities.  PASRR submitted to EDS on 07/24/17     PASRR number received on       Existing PASRR number confirmed on 07/24/17     FL2 transmitted to all facilities in geographic area requested by pt/family on 07/24/17     FL2 transmitted to all facilities within larger geographic area on       Patient informed that his/her managed care company has contracts with or will negotiate with certain facilities, including the following:        Yes   Patient/family informed of bed offers received.  Patient chooses bed at [facility]     Physician recommends and patient chooses bed at      Patient to be transferred to [facility] on 07/29/17.  Patient to be transferred to facility by [facility]     Patient family notified on 07/29/17 of transfer.  Name of family member notified:  Melton Krebs     PHYSICIAN Please prepare prescriptions     Additional  Comment:    _______________________________________________ Candie Chroman, LCSW 07/29/2017, 3:33 PM

## 2017-07-29 NOTE — Progress Notes (Signed)
Subjective:  Doing well post cardioversion Continues to be in sinus rhythym   Objective:  Vital Signs in the last 24 hours: Temp:  [98.2 F (36.8 C)-99.2 F (37.3 C)] 98.9 F (37.2 C) (04/10 0700) Pulse Rate:  [63-76] 73 (04/10 0700) Resp:  [13-25] 21 (04/10 0700) BP: (98-118)/(63-97) 118/69 (04/10 0700) SpO2:  [91 %-98 %] 91 % (04/10 0700)  Intake/Output from previous day: 04/09 0701 - 04/10 0700 In: 700 [P.O.:600; IV Piggyback:100] Out: 625 [Urine:625] Intake/Output from this shift: No intake/output data recorded.  Physical Exam: Nursing note and vitals reviewed. Constitutional: She is oriented to person, place, and time. She appears well-developed and well-nourished. No distress.  HENT:  Head: Normocephalic and atraumatic.  Neck: Normal range of motion. Neck supple. No JVD present.  Cardiovascular: Intact distal pulses. Regular rhythm. Soft II/VI holosystolic murmur apex.  Respiratory: Effort normal and breath sounds normal. She has no wheezes. She has no rales.  Barrell shaped chest  Musculoskeletal: She exhibits no edema.  Lymphadenopathy:    She has no cervical adenopathy.  Neurological: She is alert and oriented to person, place, and time. No cranial nerve deficit.  Skin: Skin is warm and dry.  Psychiatric: She has a normal mood and affect.      Lab Results: Recent Labs    07/27/17 0237  WBC 4.8  HGB 9.5*  PLT 353   Recent Labs    07/28/17 0548 07/29/17 0835  NA 133* 135  K 3.5 3.9  CL 101 99*  CO2 24 27  GLUCOSE 90 90  BUN 12 10  CREATININE 0.70 0.77   No results for input(s): TROPONINI in the last 72 hours.  Invalid input(s): CK, MB Hepatic Function Panel Recent Labs    07/27/17 0237  ALBUMIN 2.2*    Cardiac studies: EKG 04.06/2017: Afib with controlled ventricular rate 73 bpm  Echocardiogram 07/22/2017: Study Conclusions  - Left ventricle: The cavity size was normal. There was mild concentric hypertrophy. Systolic function  was normal. The estimated ejection fraction was in the range of 55% to 60%. Wall motion was normal; there were no regional wall motion abnormalities. - Mitral valve: There was moderate regurgitation. - Left atrium: The atrium was moderately dilated. - Right atrium: The atrium was mildly dilated. - Atrial septum: No defect or patent foramen ovale was identified. - Tricuspid valve: There was moderate regurgitation. - Pulmonary arteries: PA peak pressure: 33 mm Hg (S).   Assessment: [name]  Afib w/RVR CHA2DS2VASc score 3, annual stroke risk 3.2%, now in sinus rhythm s/p cardioversion on 04/09 Mod MR, mod TR, mild PH Mild troponin elevation: Type 2 MI in the setting of Afib RVR H/o non-small cell lung cancer, currently in remission Severe constipation COPD Hypothyroidism H/o orthostatic hypotension  Recommendations: Continue diltiazem 30 mg tid Continue apixaban 4 mg bid Keep around 4 and 2 respectively.  Rest of the management per the primary team  Please call us back if any questions.       LOS: 7 days    Ellicia Alix J Shaylynne Lunt 07/29/2017, 9:42 AM  [place], MD [facility] [place] Pager: [phone] Office: [phone] If no answer Cell [phone]

## 2017-07-29 NOTE — Consult Note (Signed)
            [facility] CM Primary Care Navigator  07/29/2017  [name] 24-Nov-1927 [id number]   Attemptto seepatient at the bedsideto identify possible discharge needs but she was alreadydischarged. Patient was discharged today to skilled nursing facility (SNF) for rehabilitation per therapy recommendation.  Per MD note, patient was admitted for constipation, abdominal distention and fall.   Primary care provider's office is listed as providing transition of care (TOC) follow-up.   For additional questions please contact:  Edwena Felty A. Mendy Lapinsky, BSN, RN-BC [facility] PRIMARY CARE Navigator Cell: [phone]

## 2017-07-29 NOTE — Clinical Social Work Note (Signed)
CSW facilitated patient discharge including contacting patient family and facility to confirm patient discharge plans. Clinical information faxed to facility and family agreeable with plan. CSW arranged ambulance transport via [facility] to [facility]. RN to call report prior to discharge ([phone]).  CSW will sign off for now as social work intervention is no longer needed. Please consult us again if new needs arise.  Muhannad Bignell, CSW [phone]   

## 2017-07-29 NOTE — Progress Notes (Signed)
Report called to RN Jessica f@ 16:40. Pt will be going to room 210.

## 2017-07-29 NOTE — Anesthesia Postprocedure Evaluation (Signed)
Anesthesia Post Note  Patient: [name]  Procedure(s) Performed: CARDIOVERSION (N/A )     Patient location during evaluation: Endoscopy Anesthesia Type: General Level of consciousness: awake and alert Pain management: pain level controlled Vital Signs Assessment: post-procedure vital signs reviewed and stable Respiratory status: spontaneous breathing, nonlabored ventilation, respiratory function stable and patient connected to nasal cannula oxygen Cardiovascular status: blood pressure returned to baseline and stable Postop Assessment: no apparent nausea or vomiting Anesthetic complications: no    Last Vitals:  Vitals:   07/29/17 0340 07/29/17 0700  BP: (!) 111/97 118/69  Pulse:  73  Resp: 13 (!) 21  Temp: 37.2 C 37.2 C  SpO2:  91%    Last Pain:  Vitals:   07/29/17 0700  TempSrc: Oral  PainSc:                  Fort[place] S

## 2017-07-30 ENCOUNTER — Encounter (HOSPITAL_COMMUNITY): Payer: Self-pay | Admitting: Cardiology

## 2017-07-30 DIAGNOSIS — I480 Paroxysmal atrial fibrillation: Secondary | ICD-10-CM | POA: Diagnosis not present

## 2017-07-30 DIAGNOSIS — F319 Bipolar disorder, unspecified: Secondary | ICD-10-CM | POA: Diagnosis not present

## 2017-07-30 DIAGNOSIS — K5909 Other constipation: Secondary | ICD-10-CM | POA: Diagnosis not present

## 2017-07-30 DIAGNOSIS — C3492 Malignant neoplasm of unspecified part of left bronchus or lung: Secondary | ICD-10-CM | POA: Diagnosis not present

## 2017-07-31 DIAGNOSIS — J449 Chronic obstructive pulmonary disease, unspecified: Secondary | ICD-10-CM | POA: Diagnosis not present

## 2017-07-31 DIAGNOSIS — G8929 Other chronic pain: Secondary | ICD-10-CM | POA: Diagnosis not present

## 2017-07-31 DIAGNOSIS — S32000A Wedge compression fracture of unspecified lumbar vertebra, initial encounter for closed fracture: Secondary | ICD-10-CM | POA: Diagnosis not present

## 2017-07-31 DIAGNOSIS — E039 Hypothyroidism, unspecified: Secondary | ICD-10-CM | POA: Diagnosis not present

## 2017-07-31 DIAGNOSIS — F319 Bipolar disorder, unspecified: Secondary | ICD-10-CM | POA: Diagnosis not present

## 2017-07-31 DIAGNOSIS — Z9289 Personal history of other medical treatment: Secondary | ICD-10-CM | POA: Diagnosis not present

## 2017-07-31 DIAGNOSIS — R2689 Other abnormalities of gait and mobility: Secondary | ICD-10-CM | POA: Diagnosis not present

## 2017-07-31 DIAGNOSIS — I4891 Unspecified atrial fibrillation: Secondary | ICD-10-CM | POA: Diagnosis not present

## 2017-08-04 DIAGNOSIS — R531 Weakness: Secondary | ICD-10-CM | POA: Diagnosis not present

## 2017-08-06 DIAGNOSIS — I480 Paroxysmal atrial fibrillation: Secondary | ICD-10-CM | POA: Diagnosis not present

## 2017-08-06 DIAGNOSIS — K5909 Other constipation: Secondary | ICD-10-CM | POA: Diagnosis not present

## 2017-08-06 DIAGNOSIS — C3492 Malignant neoplasm of unspecified part of left bronchus or lung: Secondary | ICD-10-CM | POA: Diagnosis not present

## 2017-08-06 DIAGNOSIS — R338 Other retention of urine: Secondary | ICD-10-CM | POA: Diagnosis not present

## 2017-08-13 ENCOUNTER — Non-Acute Institutional Stay: Payer: Self-pay | Admitting: Hospice and Palliative Medicine

## 2017-08-13 DIAGNOSIS — Z515 Encounter for palliative care: Secondary | ICD-10-CM | POA: Diagnosis not present

## 2017-08-13 NOTE — Progress Notes (Signed)
PALLIATIVE CARE CONSULT VISIT   PATIENT NAME: [name] DOB: 1927/05/20 MRN: [id number]  PRIMARY CARE PROVIDER: Elise Benne  REFERRING PROVIDER: Dr. Lysle Rubens  RESPONSIBLE PARTY: Branae Crail - daughter - [phone]   RECOMMENDATIONS and PLAN:  1. CHF - CXR reveals small B. Pleural effusions with pulmonary vascular congestion. Increased LE. Edema over past week. She was started on O2 due to hypoxia. Lasix was increased to 40mg  daily. Seems improved today, she is now off O2. Recommend following weekly BMET 2. Depression - offered SW involvement. However, patient is not sure she wants this. She is being followed chronically by psych. 3. Anxiety - alprazolam has been scheduled BID.  4. GOC - patient continues to want to return home upon discharge from rehab. However, it is unclear yet what her new functional baseline will be or what help she will require. Will follow.  5. ACP - MOST form in chart. Pt is a DNR.   I spent 10 minutes providing this consultation,  from 0830 to [place]. More than 50% of the time in this consultation was spent coordinating communication.   HISTORY OF PRESENT ILLNESS:  [name] is an 84 yo woman with multiple medical problems including stage IV NSCLC s/p chemotx with mets to liver and bone currently in remission, COPD, bipolar with chronic anxiety, chronic pain with compression fractures, hypothyroidism, and h/o urinary retention, who was hospitalized 07/21/17 to 07/29/17 with afib with RVR. She was seen in the hospital by the palliative care team and we are asked to follow during rehab. Routine follow up visit made today.   CODE STATUS: DNR  PPS: 30% HOSPICE ELIGIBILITY/DIAGNOSIS: TBD  PAST MEDICAL HISTORY:  Past Medical History:  Diagnosis Date  . Adenocarcinoma of left lung metastatic to liver ([facility]) 04/24/2017  . Anxiety   . Arthritis   . Atrial fibrillation ([place])   . Cardiac arrhythmia due to congenital heart disease   .  Chronic constipation   . COPD (chronic obstructive pulmonary disease) ([place])   . Depression   . GERD (gastroesophageal reflux disease)   . Hemorrhoid   . Hyperlipidemia   . Idiopathic hypotension   . Large cell carcinoma of left lung, stage 4 ([place]) 04/24/2017  . Lightheadedness 11/2015  . Lung cancer metastatic to bone ([place]) 04/24/2017  . Mass of lower lobe of right lung 03/25/2017  . Melanoma of skin ([place]) 12/06/2016  . Mycotic toenails 10/27/2012  . Parkinson disease ([place])   . Recurrent UTI   . 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 Varicose veins     SOCIAL HX:  Social History   Tobacco Use  . Smoking status: Former Smoker    Packs/day: 2.00    Years: 30.00    Pack years: 60.00    Types: Cigarettes    Last attempt to quit: 04/21/1980    Years since quitting: 37.3  . Smokeless tobacco: Never Used  . Tobacco comment: onset age 62 -76, up to > 1ppd (almost 2 ppd)  Substance Use Topics  . Alcohol use: No    Alcohol/week: 0.0 oz    ALLERGIES:  Allergies  Allergen Reactions  . Adhesive [Tape] Other (See Comments)    Caused blisters  . Amoxicillin Other (See Comments)    Has patient had a PCN reaction causing immediate rash, facial/tongue/throat swelling, SOB or lightheadedness with hypotension: Yes Has patient had a PCN reaction causing severe rash involving mucus membranes or skin necrosis: No Has patient had a PCN  reaction that required hospitalization No Has patient had a PCN reaction occurring within the last 10 years: Yes If all of the above answers are "NO", then may proceed with Cephalosporin use.   Caused thrush  . Ciprofloxacin Other (See Comments)    Caused thrush  . Ultram [Tramadol] Other (See Comments)    hypotension     PERTINENT MEDICATIONS:  Outpatient Encounter Medications as of 08/13/2017  Medication Sig  . acetaminophen (TYLENOL) 325 MG tablet Take 2 tablets (650 mg total) by mouth every 6 (six) hours as needed for mild pain (or Fever >/= 101).  [place] Kitchen  albuterol (VENTOLIN HFA) 108 (90 BASE) MCG/ACT inhaler Inhale 2 puffs into the lungs 2 (two) times daily as needed for wheezing or shortness of breath.  . ALPRAZolam (XANAX) 0.5 MG tablet Take 0.5-1 tablets (0.25-0.5 mg total) by mouth 2 (two) times daily as needed for anxiety.  Mar[place] Kitchen apixaban (ELIQUIS) 5 MG TABS tablet Take 5 mg by mouth 2 (two) times daily.  . Ascorbic Acid (VITAMIN C) 1000 MG tablet Take 1,000 mg by mouth daily.  Mar[place] Kitchen azelastine (ASTELIN) 0.1 % nasal spray Place 2 sprays into both nostrils at bedtime as needed for rhinitis. Use in each nostril as directed  . Cholecalciferol (VITAMIN D) 400 UNIT/ML LIQD Take 400 Units by mouth daily.   Marland Kitchen diltiazem (CARDIZEM) 30 MG tablet Take 1 tablet (30 mg total) by mouth 3 (three) times daily.  . DULoxetine (CYMBALTA) 30 MG capsule Take 1 capsule (30 mg total) by mouth 2 (two) times daily.  . folic acid (FOLVITE) 1 MG tablet Take 1 tablet (1 mg total) by mouth daily. Take daily starting 5-7 days before Alimta chemo. Continue until 21 days after Alimta completed.  . lactulose (CHRONULAC) 10 GM/15ML solution Take 15 mLs (10 g total) by mouth every 4 (four) hours as needed for mild constipation. (Patient taking differently: Take 10 g by mouth 3 (three) times daily as needed for mild constipation. )  . levothyroxine (SYNTHROID, LEVOTHROID) 25 MCG tablet Take 0.5 tablets (12.5 mcg total) by mouth daily before breakfast.  . magnesium oxide (MAG-OX) 400 (241.3 Mg) MG tablet Take 2 tablets (800 mg total) by mouth 2 (two) times daily.  . ondansetron (ZOFRAN) 8 MG tablet Take 1 tablet (8 mg total) by mouth every 8 (eight) hours as needed for nausea or vomiting.  . polyethylene glycol powder (GLYCOLAX/MIRALAX) powder Take 17 g by mouth 2 (two) times daily. (Patient taking differently: Take 17 g by mouth 2 (two) times daily as needed (for constipation.). )  . Propylene Glycol (SYSTANE BALANCE OP) Apply 2 drops to eye 3 (three) times daily as needed (for  dry/irritated eyes).    Facility-Administered Encounter Medications as of 08/13/2017  Medication  . cyanocobalamin ((VITAMIN B-12)) injection 1,000 mcg    PHYSICAL EXAM:   General: NAD, frail appearing, thin, in wheelchair Cardiovascular: regular Pulmonary: clear ant fields Abdomen: soft, nontender, + bowel sounds GU: no suprapubic tenderness Extremities: + LE Edema Skin: no rashes Neurological: Weakness but otherwise nonfocal  Irean Hong, NP

## 2017-08-17 ENCOUNTER — Inpatient Hospital Stay: Payer: Medicare Other

## 2017-08-17 ENCOUNTER — Inpatient Hospital Stay: Payer: Medicare Other | Admitting: Family

## 2017-08-17 DIAGNOSIS — I480 Paroxysmal atrial fibrillation: Secondary | ICD-10-CM | POA: Diagnosis not present

## 2017-08-17 DIAGNOSIS — R600 Localized edema: Secondary | ICD-10-CM | POA: Diagnosis not present

## 2017-08-17 DIAGNOSIS — C3492 Malignant neoplasm of unspecified part of left bronchus or lung: Secondary | ICD-10-CM | POA: Diagnosis not present

## 2017-08-17 DIAGNOSIS — F319 Bipolar disorder, unspecified: Secondary | ICD-10-CM | POA: Diagnosis not present

## 2017-08-21 DIAGNOSIS — R339 Retention of urine, unspecified: Secondary | ICD-10-CM | POA: Diagnosis not present

## 2017-08-21 DIAGNOSIS — Z9289 Personal history of other medical treatment: Secondary | ICD-10-CM | POA: Diagnosis not present

## 2017-08-21 DIAGNOSIS — J449 Chronic obstructive pulmonary disease, unspecified: Secondary | ICD-10-CM | POA: Diagnosis not present

## 2017-08-21 DIAGNOSIS — F0390 Unspecified dementia without behavioral disturbance: Secondary | ICD-10-CM | POA: Diagnosis not present

## 2017-08-21 DIAGNOSIS — C787 Secondary malignant neoplasm of liver and intrahepatic bile duct: Secondary | ICD-10-CM | POA: Diagnosis not present

## 2017-08-21 DIAGNOSIS — F319 Bipolar disorder, unspecified: Secondary | ICD-10-CM | POA: Diagnosis not present

## 2017-08-21 DIAGNOSIS — I4891 Unspecified atrial fibrillation: Secondary | ICD-10-CM | POA: Diagnosis not present

## 2017-08-21 DIAGNOSIS — S32000A Wedge compression fracture of unspecified lumbar vertebra, initial encounter for closed fracture: Secondary | ICD-10-CM | POA: Diagnosis not present

## 2017-08-21 DIAGNOSIS — R600 Localized edema: Secondary | ICD-10-CM | POA: Diagnosis not present

## 2017-08-21 DIAGNOSIS — E039 Hypothyroidism, unspecified: Secondary | ICD-10-CM | POA: Diagnosis not present

## 2017-08-21 DIAGNOSIS — C3490 Malignant neoplasm of unspecified part of unspecified bronchus or lung: Secondary | ICD-10-CM | POA: Diagnosis not present

## 2017-08-21 DIAGNOSIS — F411 Generalized anxiety disorder: Secondary | ICD-10-CM | POA: Diagnosis not present

## 2017-08-24 ENCOUNTER — Other Ambulatory Visit: Payer: Self-pay

## 2017-08-24 ENCOUNTER — Inpatient Hospital Stay: Payer: No Typology Code available for payment source

## 2017-08-24 ENCOUNTER — Inpatient Hospital Stay: Payer: No Typology Code available for payment source | Attending: Hematology & Oncology

## 2017-08-24 ENCOUNTER — Inpatient Hospital Stay (HOSPITAL_BASED_OUTPATIENT_CLINIC_OR_DEPARTMENT_OTHER): Payer: No Typology Code available for payment source | Admitting: Hematology & Oncology

## 2017-08-24 ENCOUNTER — Encounter: Payer: Self-pay | Admitting: Hematology & Oncology

## 2017-08-24 VITALS — BP 104/73 | HR 104 | Temp 97.6°F | Resp 18 | Wt 169.0 lb

## 2017-08-24 DIAGNOSIS — R0602 Shortness of breath: Secondary | ICD-10-CM | POA: Insufficient documentation

## 2017-08-24 DIAGNOSIS — C3490 Malignant neoplasm of unspecified part of unspecified bronchus or lung: Secondary | ICD-10-CM

## 2017-08-24 DIAGNOSIS — I4891 Unspecified atrial fibrillation: Secondary | ICD-10-CM | POA: Insufficient documentation

## 2017-08-24 DIAGNOSIS — R002 Palpitations: Secondary | ICD-10-CM

## 2017-08-24 DIAGNOSIS — C3431 Malignant neoplasm of lower lobe, right bronchus or lung: Secondary | ICD-10-CM | POA: Diagnosis not present

## 2017-08-24 DIAGNOSIS — Z7901 Long term (current) use of anticoagulants: Secondary | ICD-10-CM

## 2017-08-24 DIAGNOSIS — Z9221 Personal history of antineoplastic chemotherapy: Secondary | ICD-10-CM

## 2017-08-24 DIAGNOSIS — M791 Myalgia, unspecified site: Secondary | ICD-10-CM

## 2017-08-24 DIAGNOSIS — F419 Anxiety disorder, unspecified: Secondary | ICD-10-CM | POA: Insufficient documentation

## 2017-08-24 DIAGNOSIS — C7951 Secondary malignant neoplasm of bone: Secondary | ICD-10-CM | POA: Insufficient documentation

## 2017-08-24 DIAGNOSIS — C787 Secondary malignant neoplasm of liver and intrahepatic bile duct: Secondary | ICD-10-CM | POA: Insufficient documentation

## 2017-08-24 DIAGNOSIS — C3492 Malignant neoplasm of unspecified part of left bronchus or lung: Secondary | ICD-10-CM

## 2017-08-24 DIAGNOSIS — K5900 Constipation, unspecified: Secondary | ICD-10-CM | POA: Diagnosis not present

## 2017-08-24 DIAGNOSIS — E039 Hypothyroidism, unspecified: Secondary | ICD-10-CM | POA: Insufficient documentation

## 2017-08-24 DIAGNOSIS — Z79899 Other long term (current) drug therapy: Secondary | ICD-10-CM

## 2017-08-24 DIAGNOSIS — Q991 46, XX true hermaphrodite: Secondary | ICD-10-CM

## 2017-08-24 LAB — CMP (CANCER CENTER ONLY)
ALBUMIN: 2.9 g/dL — AB (ref 3.5–5.0)
ALT: 23 U/L (ref 10–47)
AST: 32 U/L (ref 11–38)
Alkaline Phosphatase: 98 U/L — ABNORMAL HIGH (ref 26–84)
Anion gap: 10 (ref 5–15)
BUN: 14 mg/dL (ref 7–22)
CHLORIDE: 98 mmol/L (ref 98–108)
CO2: 32 mmol/L (ref 18–33)
CREATININE: 0.9 mg/dL (ref 0.60–1.20)
Calcium: 9.4 mg/dL (ref 8.0–10.3)
GLUCOSE: 109 mg/dL (ref 73–118)
Potassium: 3.4 mmol/L (ref 3.3–4.7)
SODIUM: 140 mmol/L (ref 128–145)
Total Bilirubin: 0.7 mg/dL (ref 0.2–1.6)
Total Protein: 6.4 g/dL (ref 6.4–8.1)

## 2017-08-24 LAB — CBC WITH DIFFERENTIAL (CANCER CENTER ONLY)
Basophils Absolute: 0 10*3/uL (ref 0.0–0.1)
Basophils Relative: 0 %
EOS ABS: 0.3 10*3/uL (ref 0.0–0.5)
Eosinophils Relative: 4 %
HCT: 33.4 % — ABNORMAL LOW (ref 34.8–46.6)
Hemoglobin: 11.2 g/dL — ABNORMAL LOW (ref 11.6–15.9)
Lymphocytes Relative: 22 %
Lymphs Abs: 1.7 10*3/uL (ref 0.9–3.3)
MCH: 35.2 pg — ABNORMAL HIGH (ref 26.0–34.0)
MCHC: 33.5 g/dL (ref 32.0–36.0)
MCV: 105 fL — ABNORMAL HIGH (ref 81.0–101.0)
MONO ABS: 0.8 10*3/uL (ref 0.1–0.9)
MONOS PCT: 11 %
Neutro Abs: 4.7 10*3/uL (ref 1.5–6.5)
Neutrophils Relative %: 63 %
PLATELETS: 223 10*3/uL (ref 145–400)
RBC: 3.18 MIL/uL — ABNORMAL LOW (ref 3.70–5.32)
RDW: 17.5 % — AB (ref 11.1–15.7)
WBC Count: 7.5 10*3/uL (ref 3.9–10.0)

## 2017-08-24 LAB — LACTATE DEHYDROGENASE: LDH: 381 U/L — ABNORMAL HIGH (ref 125–245)

## 2017-08-24 NOTE — Progress Notes (Signed)
Hematology and Oncology Follow Up Visit  ZAMARIAH SEABORN [id number] 1927/04/29 84 y.o. 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 Lomeli is here today with her daughter for follow-up.  Unfortunately, things are changed quite significantly for her.  She was hospitalized at [facility] for a few weeks.  She was having issues with severe anxiety and almost to the point of mania.  She had to be hospitalized.  She had rapid atrial fibrillation.  She had a cardioversion for this.  I am not sure exactly if this really helped her out.  She had of the hypothyroidism.  She had constipation.  She currently is at [facility].  She is making progress there.  She clearly is in no condition to take any more treatment at this point.  Thankfully, her last PET scan showed that she actually had a complete response.  This PET scan was done on April 1.  And again, she had a complete response.  There is no disease noted in her chest or abdomen.  Given the fantastic response, I do not see that we have to do anything with her right now.  In a "perfect world", we would probably give her maintenance therapy with Alimta/Keytruda.  However, I think she just would have a difficult time with this mentally.  Her daughter comes in with her.  Her daughter also has some element of anxiety.  Her daughter is doing a great job with her and trying to make sure that she gets the care that she needs.  Ultimately, Ms. Stacey might be able to go home.  This would be nice.  She still needs to have follow-up with cardiology.  She is still has atrial fibrillation.  She is on Eliquis.  There is no bleeding.  Her appetite seems to be doing okay.  She has had no nausea or vomiting.  Overall, I  said her performance status is ECOG 2 at best.    Medications:  Allergies as of 08/24/2017      Reactions   Adhesive [tape] Other (See Comments)   Caused blisters   Amoxicillin Other (See Comments)   Has patient had a PCN reaction causing immediate rash, facial/tongue/throat swelling, SOB or lightheadedness with hypotension: Yes Has patient had a PCN reaction causing severe rash involving mucus membranes or skin necrosis: No Has patient had a PCN reaction that required hospitalization No Has patient had a PCN reaction occurring within the last 10 years: Yes If all of the above answers are "NO", then may proceed with Cephalosporin use. Caused thrush   Ciprofloxacin Other (See Comments)   Caused thrush   Ultram [tramadol] Other (See Comments)   hypotension      Medication List        Accurate as of 08/24/17 12:13 PM. Always use your most recent med list.          acetaminophen 325 MG tablet Commonly known as:  TYLENOL Take 2 tablets (650 mg total) by mouth every 6 (six) hours as needed for mild pain (or Fever >/= 101).   albuterol 108 (90 Base) MCG/ACT inhaler Commonly known as:  VENTOLIN HFA Inhale 2 puffs into the lungs 2 (two) times daily as needed for wheezing or shortness of breath.   ALPRAZolam 0.5 MG  tablet Commonly known as:  XANAX Take 0.5-1 tablets (0.25-0.5 mg total) by mouth 2 (two) times daily as needed for anxiety.   apixaban 5 MG Tabs tablet Commonly known as:  ELIQUIS Take 5 mg by mouth 2 (two) times daily.   azelastine 0.1 % nasal spray Commonly known as:  ASTELIN Place 2 sprays into both nostrils at bedtime as needed for rhinitis. Use in each nostril as directed   diltiazem 30 MG tablet Commonly known as:  CARDIZEM Take 1 tablet (30 mg total) by mouth 3 (three) times daily.   DULoxetine 30 MG capsule Commonly known as:  CYMBALTA Take 1 capsule (30 mg total) by mouth 2 (two) times daily.   folic acid 1 MG tablet Commonly known as:  FOLVITE Take 1  tablet (1 mg total) by mouth daily. Take daily starting 5-7 days before Alimta chemo. Continue until 21 days after Alimta completed.   lactulose 10 GM/15ML solution Commonly known as:  CHRONULAC Take 15 mLs (10 g total) by mouth every 4 (four) hours as needed for mild constipation.   levothyroxine 25 MCG tablet Commonly known as:  SYNTHROID, LEVOTHROID Take 0.5 tablets (12.5 mcg total) by mouth daily before breakfast.   magnesium oxide 400 (241.3 Mg) MG tablet Commonly known as:  MAG-OX Take 2 tablets (800 mg total) by mouth 2 (two) times daily.   ondansetron 8 MG tablet Commonly known as:  ZOFRAN Take 1 tablet (8 mg total) by mouth every 8 (eight) hours as needed for nausea or vomiting.   polyethylene glycol powder powder Commonly known as:  GLYCOLAX/MIRALAX Take 17 g by mouth 2 (two) times daily.   SYSTANE BALANCE OP Apply 2 drops to eye 3 (three) times daily as needed (for dry/irritated eyes).   vitamin C 1000 MG tablet Take 1,000 mg by mouth daily.   Vitamin D 400 UNIT/ML Liqd Take 400 Units by mouth daily.       Allergies:  Allergies  Allergen Reactions  . Adhesive [Tape] Other (See Comments)    Caused blisters  . Amoxicillin Other (See Comments)    Has patient had a PCN reaction causing immediate rash, facial/tongue/throat swelling, SOB or lightheadedness with hypotension: Yes Has patient had a PCN reaction causing severe rash involving mucus membranes or skin necrosis: No Has patient had a PCN reaction that required hospitalization No Has patient had a PCN reaction occurring within the last 10 years: Yes If all of the above answers are "NO", then may proceed with Cephalosporin use.   Caused thrush  . Ciprofloxacin Other (See Comments)    Caused thrush  . Ultram [Tramadol] Other (See Comments)    hypotension    Past Medical History, Surgical history, Social history, and Family History were reviewed and updated.  Review of Systems: Review of Systems    Constitutional: Negative.   HENT: Negative.   Eyes: Negative.   Respiratory: Positive for shortness of breath.   Cardiovascular: Positive for palpitations.  Gastrointestinal: Positive for constipation.  Genitourinary: Positive for frequency.  Musculoskeletal: Positive for joint pain and myalgias.  Skin: Negative.   Neurological: Negative.   Endo/Heme/Allergies: Negative.   Psychiatric/Behavioral: Negative.      Physical Exam:  weight is 169 lb (76.7 kg). Her oral temperature is 97.6 F (36.4 C). Her blood pressure is 104/73 and her pulse is 104 (abnormal). Her respiration is 18 and oxygen saturation is 100%.   Wt Readings from Last 3 Encounters:  08/24/17 169 lb (76.7 kg)  07/21/17 158 lb (  71.7 kg)  07/13/17 150 lb 12.8 oz (68.4 kg)    Physical Exam  Constitutional: She is oriented to person, place, and time.  HENT:  Head: Normocephalic and atraumatic.  Mouth/Throat: Oropharynx is clear and moist.  Eyes: Pupils are equal, round, and reactive to light. EOM are normal.  Neck: Normal range of motion.  Cardiovascular: Normal rate, regular rhythm and normal heart sounds.  Pulmonary/Chest: Effort normal and breath sounds normal.  Abdominal: Soft. Bowel sounds are normal.  Musculoskeletal: Normal range of motion. She exhibits no edema, tenderness or deformity.  Lymphadenopathy:    She has no cervical adenopathy.  Neurological: She is alert and oriented to person, place, and time.  Skin: Skin is warm and dry. No rash noted. No erythema.  Psychiatric: She has a normal mood and affect. Her behavior is normal. Judgment and thought content normal.  Vitals reviewed.     Lab Results  Component Value Date   WBC 7.5 08/24/2017   HGB 11.2 (L) 08/24/2017   HCT 33.4 (L) 08/24/2017   MCV 105.0 (H) 08/24/2017   PLT 223 08/24/2017   Lab Results  Component Value Date   FERRITIN 44.1 12/05/2016   IRON 47 12/05/2016   TIBC 159 (L) 04/10/2007   UIBC 148 04/10/2007   IRONPCTSAT 7  (L) 04/10/2007   Lab Results  Component Value Date   RBC 3.18 (L) 08/24/2017   No results found for: Nils Pyle [facility] Lab Results  Component Value Date   IGGSERUM 854 02/12/2015   IGA 288 02/12/2015   IGMSERUM 217 02/12/2015   Lab Results  Component Value Date   TOTALPROTELP 6.8 02/12/2015   ALBUMINELP 4.1 02/12/2015   A1GS 0.3 02/12/2015   A2GS 0.7 02/12/2015   BETS 0.5 02/12/2015   BETA2SER 0.3 02/12/2015   GAMS 0.9 02/12/2015   SPEI SEE NOTE 02/12/2015     Chemistry      Component Value Date/Time   NA 140 08/24/2017 1006   NA 147 (H) 04/10/2017 1141   K 3.4 08/24/2017 1006   K 3.6 04/10/2017 1141   CL 98 08/24/2017 1006   CL 107 04/10/2017 1141   CO2 32 08/24/2017 1006   CO2 28 04/10/2017 1141   BUN 14 08/24/2017 1006   BUN 14 04/10/2017 1141   CREATININE 0.90 08/24/2017 1006   CREATININE 1.0 04/10/2017 1141   GLU 68 09/05/2016      Component Value Date/Time   CALCIUM 9.4 08/24/2017 1006   CALCIUM 9.5 04/10/2017 1141   ALKPHOS 98 (H) 08/24/2017 1006   ALKPHOS 84 04/10/2017 1141   AST 32 08/24/2017 1006   ALT 23 08/24/2017 1006   ALT 23 04/10/2017 1141   BILITOT 0.7 08/24/2017 1006       Impression and Plan: Ms. Awwad is a very pleasant 84 yo caucasian female with non-small cell metastatic bronchogenic carcinoma with hepatic, lymph node and bone metastasis.   Again, we will hold any further chemotherapy until she improves significantly.  I told her that she and her daughter that I really do not give chemotherapy to patients who are at nursing homes.  I do still think she is strong enough for treatment.  I probably would repeat a PET scan in another 3 or 4 weeks.  I think that as long as we see her in remission, we can just follow her along.  I just feel bad that she had these issues that put her in the hospital.  When I saw her in the  hospital, she just was quite feeble.  I will plan to see her back in 1 month.  I spent about  40 minutes with she and her daughter.  I spent all the time face-to-face with them.  I answered all of the questions that they had.  I reviewed the lab work.     Volanda Napoleon, MD 5/6/201912:13 PM

## 2017-08-25 ENCOUNTER — Non-Acute Institutional Stay: Payer: BLUE CROSS/BLUE SHIELD | Admitting: Hospice and Palliative Medicine

## 2017-08-25 DIAGNOSIS — F419 Anxiety disorder, unspecified: Secondary | ICD-10-CM | POA: Diagnosis not present

## 2017-08-25 DIAGNOSIS — I480 Paroxysmal atrial fibrillation: Secondary | ICD-10-CM | POA: Diagnosis not present

## 2017-08-25 DIAGNOSIS — R410 Disorientation, unspecified: Secondary | ICD-10-CM | POA: Diagnosis not present

## 2017-08-25 DIAGNOSIS — W19XXXD Unspecified fall, subsequent encounter: Secondary | ICD-10-CM | POA: Diagnosis not present

## 2017-08-25 DIAGNOSIS — Z515 Encounter for palliative care: Secondary | ICD-10-CM

## 2017-08-25 NOTE — Progress Notes (Signed)
Visit attempted at facility. However, patient was occupied. Will return at later date.

## 2017-08-26 DIAGNOSIS — Z515 Encounter for palliative care: Secondary | ICD-10-CM | POA: Diagnosis not present

## 2017-08-28 DIAGNOSIS — F319 Bipolar disorder, unspecified: Secondary | ICD-10-CM | POA: Diagnosis not present

## 2017-08-28 DIAGNOSIS — C3492 Malignant neoplasm of unspecified part of left bronchus or lung: Secondary | ICD-10-CM | POA: Diagnosis not present

## 2017-08-28 DIAGNOSIS — R600 Localized edema: Secondary | ICD-10-CM | POA: Diagnosis not present

## 2017-08-28 DIAGNOSIS — I480 Paroxysmal atrial fibrillation: Secondary | ICD-10-CM | POA: Diagnosis not present

## 2017-08-31 DIAGNOSIS — I480 Paroxysmal atrial fibrillation: Secondary | ICD-10-CM | POA: Diagnosis not present

## 2017-08-31 DIAGNOSIS — C3492 Malignant neoplasm of unspecified part of left bronchus or lung: Secondary | ICD-10-CM | POA: Diagnosis not present

## 2017-08-31 DIAGNOSIS — N390 Urinary tract infection, site not specified: Secondary | ICD-10-CM | POA: Diagnosis not present

## 2017-08-31 DIAGNOSIS — R600 Localized edema: Secondary | ICD-10-CM | POA: Diagnosis not present

## 2017-09-02 ENCOUNTER — Non-Acute Institutional Stay: Payer: Self-pay | Admitting: Hospice and Palliative Medicine

## 2017-09-02 DIAGNOSIS — Z515 Encounter for palliative care: Secondary | ICD-10-CM | POA: Diagnosis not present

## 2017-09-02 NOTE — Progress Notes (Signed)
PALLIATIVE CARE CONSULT VISIT   PATIENT NAME: [name] DOB: January 18, 1928 MRN: [id number]  PRIMARY CARE PROVIDER: Elise [name]  REFERRING PROVIDER: Dr. Lysle [name]  RESPONSIBLE PARTY: [name] - daughter - [phone]   RECOMMENDATIONS and PLAN:  1. CHF - stable on lasix. Improved LE edema. 2. GOC - patient maintains primary goal is to return home and plans to hire caregivers when daughter is not there (2 weeks out of the month). I stressed need for 24/7 care at home. It seems less likely that patient will be able to return to her previous functional baseline and is now essentially chairbound. She would be appropriate for hospice care at home if there is no further plans for workup or treatment by oncology. Otherwise, I would recommend that she is followed at home by palliative care. I met with facility SW today to discuss patient's disposition.   I spent 10 minutes providing this consultation,  from 1400 to 1410. More than 50% of the time in this consultation was spent coordinating communication.   HISTORY OF PRESENT ILLNESS:  [name] is an 84 yo woman with multiple medical problems including stage IV NSCLC s/p chemotx with mets to liver and bone currently in remission, COPD, bipolar with chronic anxiety, chronic pain with compression fractures, hypothyroidism, and h/o urinary retention, who was hospitalized 07/21/17 to 07/29/17 with afib with RVR. She was seen in the hospital by the palliative care team and we are asked to follow during rehab. Routine follow up visit made today.   CODE STATUS: DNR  PPS: 30% HOSPICE ELIGIBILITY/DIAGNOSIS: TBD  PAST MEDICAL HISTORY:  Past Medical History:  Diagnosis Date  . Adenocarcinoma of left lung metastatic to liver ([place]) 04/24/2017  . Anxiety   . Arthritis   . Atrial fibrillation ([place])   . Cardiac arrhythmia due to congenital heart disease   . Chronic constipation   . COPD (chronic obstructive pulmonary disease)  ([place])   . Depression   . GERD (gastroesophageal reflux disease)   . Hemorrhoid   . Hyperlipidemia   . Idiopathic hypotension   . Large cell carcinoma of left lung, stage 4 ([place]) 04/24/2017  . Lightheadedness 11/2015  . Lung cancer metastatic to bone ([place]) 04/24/2017  . Mass of lower lobe of right lung 03/25/2017  . Melanoma of skin ([place]) 12/06/2016  . Mycotic toenails 10/27/2012  . Parkinson disease ([place])   . Recurrent UTI   . 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 Varicose veins     SOCIAL HX:  Social History   Tobacco Use  . Smoking status: Former Smoker    Packs/day: 2.00    Years: 30.00    Pack years: 60.00    Types: Cigarettes    Last attempt to quit: 04/21/1980    Years since quitting: 37.3  . Smokeless tobacco: Never Used  . Tobacco comment: onset age 9 -62, up to > 1ppd (almost 2 ppd)  Substance Use Topics  . Alcohol use: No    Alcohol/week: 0.0 oz    ALLERGIES:  Allergies  Allergen Reactions  . Adhesive [Tape] Other (See Comments)    Caused blisters  . Amoxicillin Other (See Comments)    Has patient had a PCN reaction causing immediate rash, facial/tongue/throat swelling, SOB or lightheadedness with hypotension: Yes Has patient had a PCN reaction causing severe rash involving mucus membranes or skin necrosis: No Has patient had a PCN reaction that required hospitalization No Has patient had a PCN reaction occurring  within the last 10 years: Yes If all of the above answers are "NO", then may proceed with Cephalosporin use.   Caused thrush  . Ciprofloxacin Other (See Comments)    Caused thrush  . Ultram [Tramadol] Other (See Comments)    hypotension     PERTINENT MEDICATIONS:  Outpatient Encounter Medications as of 09/02/2017  Medication Sig  . acetaminophen (TYLENOL) 325 MG tablet Take 2 tablets (650 mg total) by mouth every 6 (six) hours as needed for mild pain (or Fever >/= 101).  Marland Kitchen albuterol (VENTOLIN HFA) 108 (90 BASE) MCG/ACT inhaler Inhale 2 puffs into  the lungs 2 (two) times daily as needed for wheezing or shortness of breath.  . ALPRAZolam (XANAX) 0.5 MG tablet Take 0.5-1 tablets (0.25-0.5 mg total) by mouth 2 (two) times daily as needed for anxiety.  Marland Kitchen apixaban (ELIQUIS) 5 MG TABS tablet Take 5 mg by mouth 2 (two) times daily.  . Ascorbic Acid (VITAMIN C) 1000 MG tablet Take 1,000 mg by mouth daily.  Marland Kitchen azelastine (ASTELIN) 0.1 % nasal spray Place 2 sprays into both nostrils at bedtime as needed for rhinitis. Use in each nostril as directed  . Cholecalciferol (VITAMIN D) 400 UNIT/ML LIQD Take 400 Units by mouth daily.   Marland Kitchen diltiazem (CARDIZEM) 30 MG tablet Take 1 tablet (30 mg total) by mouth 3 (three) times daily.  . DULoxetine (CYMBALTA) 30 MG capsule Take 1 capsule (30 mg total) by mouth 2 (two) times daily.  . folic acid (FOLVITE) 1 MG tablet Take 1 tablet (1 mg total) by mouth daily. Take daily starting 5-7 days before Alimta chemo. Continue until 21 days after Alimta completed.  . lactulose (CHRONULAC) 10 GM/15ML solution Take 15 mLs (10 g total) by mouth every 4 (four) hours as needed for mild constipation. (Patient taking differently: Take 10 g by mouth 3 (three) times daily as needed for mild constipation. )  . levothyroxine (SYNTHROID, LEVOTHROID) 25 MCG tablet Take 0.5 tablets (12.5 mcg total) by mouth daily before breakfast.  . magnesium oxide (MAG-OX) 400 (241.3 Mg) MG tablet Take 2 tablets (800 mg total) by mouth 2 (two) times daily.  . ondansetron (ZOFRAN) 8 MG tablet Take 1 tablet (8 mg total) by mouth every 8 (eight) hours as needed for nausea or vomiting.  . polyethylene glycol powder (GLYCOLAX/MIRALAX) powder Take 17 g by mouth 2 (two) times daily. (Patient taking differently: Take 17 g by mouth 2 (two) times daily as needed (for constipation.). )  . Propylene Glycol (SYSTANE BALANCE OP) Apply 2 drops to eye 3 (three) times daily as needed (for dry/irritated eyes).    Facility-Administered Encounter Medications as of 09/02/2017    Medication  . cyanocobalamin ((VITAMIN B-12)) injection 1,000 mcg    PHYSICAL EXAM:   General: NAD, frail appearing, thin, in chair Cardiovascular: regular Pulmonary: clear ant fields, on O2 Abdomen: soft, nontender, + bowel sounds GU: no suprapubic tenderness Extremities: + LE Edema Skin: no rashes Neurological: Weakness, some confusion  Irean Hong, NP

## 2017-09-08 DIAGNOSIS — F319 Bipolar disorder, unspecified: Secondary | ICD-10-CM | POA: Diagnosis not present

## 2017-09-08 DIAGNOSIS — N390 Urinary tract infection, site not specified: Secondary | ICD-10-CM | POA: Diagnosis not present

## 2017-09-08 DIAGNOSIS — I480 Paroxysmal atrial fibrillation: Secondary | ICD-10-CM | POA: Diagnosis not present

## 2017-09-08 DIAGNOSIS — R600 Localized edema: Secondary | ICD-10-CM | POA: Diagnosis not present

## 2017-09-16 DIAGNOSIS — L89612 Pressure ulcer of right heel, stage 2: Secondary | ICD-10-CM | POA: Diagnosis not present

## 2017-09-16 DIAGNOSIS — F419 Anxiety disorder, unspecified: Secondary | ICD-10-CM | POA: Diagnosis not present

## 2017-09-16 DIAGNOSIS — F319 Bipolar disorder, unspecified: Secondary | ICD-10-CM | POA: Diagnosis not present

## 2017-09-16 DIAGNOSIS — R0602 Shortness of breath: Secondary | ICD-10-CM | POA: Diagnosis not present

## 2017-09-16 DIAGNOSIS — C3491 Malignant neoplasm of unspecified part of right bronchus or lung: Secondary | ICD-10-CM | POA: Diagnosis not present

## 2017-09-16 DIAGNOSIS — R600 Localized edema: Secondary | ICD-10-CM | POA: Diagnosis not present

## 2017-09-16 DIAGNOSIS — G20 Parkinson's disease: Secondary | ICD-10-CM | POA: Diagnosis not present

## 2017-09-16 DIAGNOSIS — I480 Paroxysmal atrial fibrillation: Secondary | ICD-10-CM | POA: Diagnosis not present

## 2017-09-17 ENCOUNTER — Ambulatory Visit (HOSPITAL_COMMUNITY): Payer: Medicare Other

## 2017-09-21 ENCOUNTER — Ambulatory Visit (HOSPITAL_COMMUNITY)
Admission: RE | Admit: 2017-09-21 | Discharge: 2017-09-21 | Disposition: A | Discharge status: Home / Self Care | Payer: Medicare Other | Source: Ambulatory Visit | Attending: Hematology & Oncology | Admitting: Hematology & Oncology

## 2017-09-21 DIAGNOSIS — I2510 Atherosclerotic heart disease of native coronary artery without angina pectoris: Secondary | ICD-10-CM | POA: Insufficient documentation

## 2017-09-21 DIAGNOSIS — K439 Ventral hernia without obstruction or gangrene: Secondary | ICD-10-CM | POA: Diagnosis not present

## 2017-09-21 DIAGNOSIS — K629 Disease of anus and rectum, unspecified: Secondary | ICD-10-CM | POA: Insufficient documentation

## 2017-09-21 DIAGNOSIS — Q991 46, XX true hermaphrodite: Secondary | ICD-10-CM | POA: Diagnosis not present

## 2017-09-21 DIAGNOSIS — J90 Pleural effusion, not elsewhere classified: Secondary | ICD-10-CM | POA: Diagnosis not present

## 2017-09-21 DIAGNOSIS — R188 Other ascites: Secondary | ICD-10-CM | POA: Diagnosis not present

## 2017-09-21 DIAGNOSIS — I700 Atherosclerosis of aorta: Secondary | ICD-10-CM | POA: Insufficient documentation

## 2017-09-21 DIAGNOSIS — J9811 Atelectasis: Secondary | ICD-10-CM | POA: Insufficient documentation

## 2017-09-21 DIAGNOSIS — C3490 Malignant neoplasm of unspecified part of unspecified bronchus or lung: Secondary | ICD-10-CM | POA: Diagnosis not present

## 2017-09-21 DIAGNOSIS — K769 Liver disease, unspecified: Secondary | ICD-10-CM | POA: Diagnosis not present

## 2017-09-21 LAB — GLUCOSE, CAPILLARY: GLUCOSE-CAPILLARY: 88 mg/dL (ref 65–99)

## 2017-09-21 MED ORDER — FLUDEOXYGLUCOSE F - 18 (FDG) INJECTION
7.0000 | Freq: Once | INTRAVENOUS | Status: AC
  Administered 2017-09-21: 7 via INTRAVENOUS

## 2017-09-22 ENCOUNTER — Inpatient Hospital Stay: Payer: Medicare Other | Attending: Hematology & Oncology | Admitting: Hematology & Oncology

## 2017-09-22 ENCOUNTER — Other Ambulatory Visit: Payer: Self-pay

## 2017-09-22 ENCOUNTER — Encounter: Payer: Self-pay | Admitting: Hematology & Oncology

## 2017-09-22 ENCOUNTER — Inpatient Hospital Stay: Payer: Medicare Other

## 2017-09-22 VITALS — BP 105/70 | HR 73 | Temp 98.1°F | Resp 17 | Wt 177.0 lb

## 2017-09-22 DIAGNOSIS — Z79899 Other long term (current) drug therapy: Secondary | ICD-10-CM | POA: Diagnosis not present

## 2017-09-22 DIAGNOSIS — J90 Pleural effusion, not elsewhere classified: Secondary | ICD-10-CM

## 2017-09-22 DIAGNOSIS — Z7901 Long term (current) use of anticoagulants: Secondary | ICD-10-CM | POA: Diagnosis not present

## 2017-09-22 DIAGNOSIS — C7951 Secondary malignant neoplasm of bone: Secondary | ICD-10-CM | POA: Diagnosis not present

## 2017-09-22 DIAGNOSIS — F419 Anxiety disorder, unspecified: Secondary | ICD-10-CM | POA: Diagnosis not present

## 2017-09-22 DIAGNOSIS — Z9225 Personal history of immunosupression therapy: Secondary | ICD-10-CM | POA: Diagnosis not present

## 2017-09-22 DIAGNOSIS — M791 Myalgia, unspecified site: Secondary | ICD-10-CM | POA: Diagnosis not present

## 2017-09-22 DIAGNOSIS — Z9221 Personal history of antineoplastic chemotherapy: Secondary | ICD-10-CM

## 2017-09-22 DIAGNOSIS — C3492 Malignant neoplasm of unspecified part of left bronchus or lung: Secondary | ICD-10-CM

## 2017-09-22 DIAGNOSIS — R410 Disorientation, unspecified: Secondary | ICD-10-CM

## 2017-09-22 DIAGNOSIS — C3431 Malignant neoplasm of lower lobe, right bronchus or lung: Secondary | ICD-10-CM

## 2017-09-22 DIAGNOSIS — C787 Secondary malignant neoplasm of liver and intrahepatic bile duct: Secondary | ICD-10-CM

## 2017-09-22 LAB — CMP (CANCER CENTER ONLY)
ALBUMIN: 2.6 g/dL — AB (ref 3.5–5.0)
ALK PHOS: 93 U/L — AB (ref 26–84)
ALT: 17 U/L (ref 10–47)
AST: 29 U/L (ref 11–38)
Anion gap: 12 (ref 5–15)
BILIRUBIN TOTAL: 0.6 mg/dL (ref 0.2–1.6)
BUN: 16 mg/dL (ref 7–22)
CALCIUM: 9.3 mg/dL (ref 8.0–10.3)
CO2: 32 mmol/L (ref 18–33)
CREATININE: 1.2 mg/dL (ref 0.60–1.20)
Chloride: 99 mmol/L (ref 98–108)
Glucose, Bld: 94 mg/dL (ref 73–118)
Potassium: 4.1 mmol/L (ref 3.3–4.7)
Sodium: 143 mmol/L (ref 128–145)
Total Protein: 6.6 g/dL (ref 6.4–8.1)

## 2017-09-22 LAB — CBC WITH DIFFERENTIAL (CANCER CENTER ONLY)
BASOS ABS: 0 10*3/uL (ref 0.0–0.1)
BASOS PCT: 0 %
Eosinophils Absolute: 0.2 10*3/uL (ref 0.0–0.5)
Eosinophils Relative: 3 %
HEMATOCRIT: 33.4 % — AB (ref 34.8–46.6)
Hemoglobin: 10.7 g/dL — ABNORMAL LOW (ref 11.6–15.9)
Lymphocytes Relative: 16 %
Lymphs Abs: 1.3 10*3/uL (ref 0.9–3.3)
MCH: 33.1 pg (ref 26.0–34.0)
MCHC: 32 g/dL (ref 32.0–36.0)
MCV: 103.4 fL — ABNORMAL HIGH (ref 81.0–101.0)
MONOS PCT: 7 %
Monocytes Absolute: 0.6 10*3/uL (ref 0.1–0.9)
Neutro Abs: 5.9 10*3/uL (ref 1.5–6.5)
Neutrophils Relative %: 74 %
Platelet Count: 320 10*3/uL (ref 145–400)
RBC: 3.23 MIL/uL — ABNORMAL LOW (ref 3.70–5.32)
RDW: 14 % (ref 11.1–15.7)
WBC Count: 8 10*3/uL (ref 3.9–10.0)

## 2017-09-22 LAB — LACTATE DEHYDROGENASE: LDH: 320 U/L — ABNORMAL HIGH (ref 125–245)

## 2017-09-22 NOTE — Progress Notes (Signed)
Hematology and Oncology Follow Up Visit  [name] [id number] 08/05/1927 84 y.o. 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  She is getting a little bit better.  She now is at [facility].  Hopefully, she will make further improvement there.  We did go ahead and do a PET scan on her.  This was done last week.  Unfortunately, the PET scan does show a new tumor in the liver.  This is solitary in the liver.  It measures 3.1 cm.  Has an SUV of 17.4.  No other areas of disease are noted on PET scan.  She does have a significant right pleural effusion.  This is gone had to be drained.  I am still not sure she is a good candidate for any type of systemic therapy.  She still has confusion.  She comes in a wheelchair.  She asked a lot of the same questions over and over.  She is not hurting.  I am unsure how well she is really eating.  She is on Eliquis.  She does have the cardiac issues.  She has had no bleeding.  There is been no problems with constipation.  Overall, I would say that her performance status is ECOG 3.    Medications:  Allergies as of 09/22/2017      Reactions   Adhesive [tape] Other (See Comments)   Caused blisters   Amoxicillin Other (See Comments)   Has patient had a PCN reaction causing immediate rash, facial/tongue/throat swelling, SOB or lightheadedness with hypotension: Yes Has patient had a PCN reaction causing severe rash involving mucus membranes or skin necrosis: No Has patient had a PCN reaction that required hospitalization No Has patient had a PCN reaction occurring within the last 10 years: Yes If all of the above answers are "NO", then may proceed with  Cephalosporin use. Caused thrush   Ciprofloxacin Other (See Comments)   Caused thrush   Ultram [tramadol] Other (See Comments)   hypotension      Medication List        Accurate as of 09/22/17 10:29 AM. Always use your most recent med list.          acetaminophen 325 MG tablet Commonly known as:  TYLENOL Take 2 tablets (650 mg total) by mouth every 6 (six) hours as needed for mild pain (or Fever >/= 101).   albuterol 108 (90 Base) MCG/ACT inhaler Commonly known as:  VENTOLIN HFA Inhale 2 puffs into the lungs 2 (two) times daily as needed for wheezing or shortness of breath.   ALPRAZolam 0.5 MG tablet Commonly known as:  XANAX Take 0.5-1 tablets (0.25-0.5 mg total) by mouth 2 (two) times daily as needed for anxiety.   apixaban 5 MG Tabs tablet Commonly known as:  ELIQUIS Take 5 mg by mouth 2 (two) times daily.   azelastine 0.1 % nasal spray Commonly known as:  ASTELIN Place 2 sprays into both nostrils at bedtime as needed for rhinitis. Use in each nostril as directed   diltiazem 30 MG tablet Commonly known as:  CARDIZEM Take 1 tablet (30 mg total) by mouth 3 (three) times daily.   DULoxetine 30 MG capsule Commonly known as:  CYMBALTA  Take 1 capsule (30 mg total) by mouth 2 (two) times daily.   folic acid 1 MG tablet Commonly known as:  FOLVITE Take 1 tablet (1 mg total) by mouth daily. Take daily starting 5-7 days before Alimta chemo. Continue until 21 days after Alimta completed.   lactulose 10 GM/15ML solution Commonly known as:  CHRONULAC Take 15 mLs (10 g total) by mouth every 4 (four) hours as needed for mild constipation.   levothyroxine 25 MCG tablet Commonly known as:  SYNTHROID, LEVOTHROID Take 0.5 tablets (12.5 mcg total) by mouth daily before breakfast.   magnesium oxide 400 (241.3 Mg) MG tablet Commonly known as:  MAG-OX Take 2 tablets (800 mg total) by mouth 2 (two) times daily.   ondansetron 8 MG tablet Commonly known as:  ZOFRAN Take 1 tablet  (8 mg total) by mouth every 8 (eight) hours as needed for nausea or vomiting.   polyethylene glycol powder powder Commonly known as:  GLYCOLAX/MIRALAX Take 17 g by mouth 2 (two) times daily.   SYSTANE BALANCE OP Apply 2 drops to eye 3 (three) times daily as needed (for dry/irritated eyes).   vitamin C 1000 MG tablet Take 1,000 mg by mouth daily.   Vitamin D 400 UNIT/ML Liqd Take 400 Units by mouth daily.       Allergies:  Allergies  Allergen Reactions  . Adhesive [Tape] Other (See Comments)    Caused blisters  . Amoxicillin Other (See Comments)    Has patient had a PCN reaction causing immediate rash, facial/tongue/throat swelling, SOB or lightheadedness with hypotension: Yes Has patient had a PCN reaction causing severe rash involving mucus membranes or skin necrosis: No Has patient had a PCN reaction that required hospitalization No Has patient had a PCN reaction occurring within the last 10 years: Yes If all of the above answers are "NO", then may proceed with Cephalosporin use.   Caused thrush  . Ciprofloxacin Other (See Comments)    Caused thrush  . Ultram [Tramadol] Other (See Comments)    hypotension    Past Medical History, Surgical history, Social history, and Family History were reviewed and updated.  Review of Systems: Review of Systems  Constitutional: Negative.   HENT: Negative.   Eyes: Negative.   Respiratory: Positive for shortness of breath.   Cardiovascular: Positive for palpitations.  Gastrointestinal: Positive for constipation.  Genitourinary: Positive for frequency.  Musculoskeletal: Positive for joint pain and myalgias.  Skin: Negative.   Neurological: Negative.   Endo/Heme/Allergies: Negative.   Psychiatric/Behavioral: Negative.      Physical Exam:  weight is 177 lb (80.3 kg). Her oral temperature is 98.1 F (36.7 C). Her blood pressure is 105/70 and her pulse is 73. Her respiration is 17 and oxygen saturation is 100%.   Wt Readings  from Last 3 Encounters:  09/22/17 177 lb (80.3 kg)  08/24/17 169 lb (76.7 kg)  07/21/17 158 lb (71.7 kg)    Physical Exam  Constitutional: She is oriented to person, place, and time.  HENT:  Head: Normocephalic and atraumatic.  Mouth/Throat: Oropharynx is clear and moist.  Eyes: Pupils are equal, round, and reactive to light. EOM are normal.  Neck: Normal range of motion.  Cardiovascular: Normal rate, regular rhythm and normal heart sounds.  Pulmonary/Chest: Effort normal and breath sounds normal.  Abdominal: Soft. Bowel sounds are normal.  Musculoskeletal: Normal range of motion. She exhibits no edema, tenderness or deformity.  Lymphadenopathy:    She has no cervical adenopathy.  Neurological: She is alert  and oriented to person, place, and time.  Skin: Skin is warm and dry. No rash noted. No erythema.  Psychiatric: She has a normal mood and affect. Her behavior is normal. Judgment and thought content normal.  Vitals reviewed.     Lab Results  Component Value Date   WBC 8.0 09/22/2017   HGB 10.7 (L) 09/22/2017   HCT 33.4 (L) 09/22/2017   MCV 103.4 (H) 09/22/2017   PLT 320 09/22/2017   Lab Results  Component Value Date   FERRITIN 44.1 12/05/2016   IRON 47 12/05/2016   TIBC 159 (L) 04/10/2007   UIBC 148 04/10/2007   IRONPCTSAT 7 (L) 04/10/2007   Lab Results  Component Value Date   RBC 3.23 (L) 09/22/2017   No results found for: Nils Pyle [facility] Lab Results  Component Value Date   IGGSERUM 854 02/12/2015   IGA 288 02/12/2015   IGMSERUM 217 02/12/2015   Lab Results  Component Value Date   TOTALPROTELP 6.8 02/12/2015   ALBUMINELP 4.1 02/12/2015   A1GS 0.3 02/12/2015   A2GS 0.7 02/12/2015   BETS 0.5 02/12/2015   BETA2SER 0.3 02/12/2015   GAMS 0.9 02/12/2015   SPEI SEE NOTE 02/12/2015     Chemistry      Component Value Date/Time   NA 140 08/24/2017 1006   NA 147 (H) 04/10/2017 1141   K 3.4 08/24/2017 1006   K 3.6 04/10/2017 1141    CL 98 08/24/2017 1006   CL 107 04/10/2017 1141   CO2 32 08/24/2017 1006   CO2 28 04/10/2017 1141   BUN 14 08/24/2017 1006   BUN 14 04/10/2017 1141   CREATININE 0.90 08/24/2017 1006   CREATININE 1.0 04/10/2017 1141   GLU 68 09/05/2016      Component Value Date/Time   CALCIUM 9.4 08/24/2017 1006   CALCIUM 9.5 04/10/2017 1141   ALKPHOS 98 (H) 08/24/2017 1006   ALKPHOS 84 04/10/2017 1141   AST 32 08/24/2017 1006   ALT 23 08/24/2017 1006   ALT 23 04/10/2017 1141   BILITOT 0.7 08/24/2017 1006       Impression and Plan: Ms. Tipping is a very pleasant 84 yo caucasian female with non-small cell metastatic bronchogenic carcinoma with hepatic, lymph node and bone metastasis.   Again, we will hold any further chemotherapy until she improves significantly.  I told her that she and her daughter that I really do not give chemotherapy to patients who are at nursing homes.  I do still think she is strong enough for treatment.  We will have to see how everything looks when we get her back in July.  If she is doing better in July, the may be we will consider treatment on her.  I do want to see about the thoracentesis.  She will need to be off Eliquis for 2 days prior to the thoracentesis.  This will be of the right lung.  I spent about 45 minutes with she and her daughter.  All the time was spent face-to-face with them.  There is still a lot of anxiety.     Volanda Napoleon, MD 6/4/201910:29 AM

## 2017-09-23 ENCOUNTER — Encounter (HOSPITAL_COMMUNITY): Payer: Self-pay

## 2017-09-23 ENCOUNTER — Emergency Department (HOSPITAL_COMMUNITY)
Admission: EM | Admit: 2017-09-23 | Discharge: 2017-09-24 | Disposition: A | Discharge status: Home / Self Care | Payer: Medicare Other | Attending: Emergency Medicine | Admitting: Emergency Medicine

## 2017-09-23 ENCOUNTER — Other Ambulatory Visit: Payer: Self-pay

## 2017-09-23 ENCOUNTER — Emergency Department (HOSPITAL_COMMUNITY): Payer: Medicare Other

## 2017-09-23 DIAGNOSIS — Y939 Activity, unspecified: Secondary | ICD-10-CM | POA: Insufficient documentation

## 2017-09-23 DIAGNOSIS — S01112A Laceration without foreign body of left eyelid and periocular area, initial encounter: Secondary | ICD-10-CM | POA: Insufficient documentation

## 2017-09-23 DIAGNOSIS — Z79899 Other long term (current) drug therapy: Secondary | ICD-10-CM | POA: Diagnosis not present

## 2017-09-23 DIAGNOSIS — Z87891 Personal history of nicotine dependence: Secondary | ICD-10-CM | POA: Diagnosis not present

## 2017-09-23 DIAGNOSIS — Y999 Unspecified external cause status: Secondary | ICD-10-CM | POA: Insufficient documentation

## 2017-09-23 DIAGNOSIS — I4891 Unspecified atrial fibrillation: Secondary | ICD-10-CM | POA: Diagnosis not present

## 2017-09-23 DIAGNOSIS — G20 Parkinson's disease: Secondary | ICD-10-CM | POA: Diagnosis not present

## 2017-09-23 DIAGNOSIS — S79912A Unspecified injury of left hip, initial encounter: Secondary | ICD-10-CM | POA: Diagnosis not present

## 2017-09-23 DIAGNOSIS — Y92121 Bathroom in nursing home as the place of occurrence of the external cause: Secondary | ICD-10-CM | POA: Diagnosis not present

## 2017-09-23 DIAGNOSIS — W19XXXA Unspecified fall, initial encounter: Secondary | ICD-10-CM | POA: Diagnosis not present

## 2017-09-23 DIAGNOSIS — S199XXA Unspecified injury of neck, initial encounter: Secondary | ICD-10-CM | POA: Diagnosis not present

## 2017-09-23 DIAGNOSIS — S0003XA Contusion of scalp, initial encounter: Secondary | ICD-10-CM | POA: Diagnosis not present

## 2017-09-23 DIAGNOSIS — W010XXA Fall on same level from slipping, tripping and stumbling without subsequent striking against object, initial encounter: Secondary | ICD-10-CM | POA: Diagnosis not present

## 2017-09-23 DIAGNOSIS — Z7901 Long term (current) use of anticoagulants: Secondary | ICD-10-CM | POA: Diagnosis not present

## 2017-09-23 DIAGNOSIS — S0990XA Unspecified injury of head, initial encounter: Secondary | ICD-10-CM | POA: Insufficient documentation

## 2017-09-23 DIAGNOSIS — R609 Edema, unspecified: Secondary | ICD-10-CM | POA: Diagnosis not present

## 2017-09-23 DIAGNOSIS — S0181XA Laceration without foreign body of other part of head, initial encounter: Secondary | ICD-10-CM

## 2017-09-23 DIAGNOSIS — J449 Chronic obstructive pulmonary disease, unspecified: Secondary | ICD-10-CM | POA: Insufficient documentation

## 2017-09-23 DIAGNOSIS — R51 Headache: Secondary | ICD-10-CM | POA: Diagnosis not present

## 2017-09-23 DIAGNOSIS — R58 Hemorrhage, not elsewhere classified: Secondary | ICD-10-CM | POA: Diagnosis not present

## 2017-09-23 DIAGNOSIS — R5383 Other fatigue: Secondary | ICD-10-CM | POA: Diagnosis not present

## 2017-09-23 DIAGNOSIS — M25552 Pain in left hip: Secondary | ICD-10-CM | POA: Diagnosis not present

## 2017-09-23 DIAGNOSIS — I959 Hypotension, unspecified: Secondary | ICD-10-CM | POA: Diagnosis not present

## 2017-09-23 LAB — BASIC METABOLIC PANEL
ANION GAP: 12 (ref 5–15)
BUN: 17 mg/dL (ref 6–20)
CALCIUM: 8.8 mg/dL — AB (ref 8.9–10.3)
CO2: 28 mmol/L (ref 22–32)
Chloride: 100 mmol/L — ABNORMAL LOW (ref 101–111)
Creatinine, Ser: 1.05 mg/dL — ABNORMAL HIGH (ref 0.44–1.00)
GFR, EST AFRICAN AMERICAN: 54 mL/min — AB (ref >60)
GFR, EST NON AFRICAN AMERICAN: 46 mL/min — AB (ref >60)
GLUCOSE: 91 mg/dL (ref 65–99)
Potassium: 3.8 mmol/L (ref 3.5–5.1)
Sodium: 140 mmol/L (ref 135–145)

## 2017-09-23 LAB — CBC
HCT: 34.2 % — ABNORMAL LOW (ref 36.0–46.0)
HEMOGLOBIN: 10.9 g/dL — AB (ref 12.0–15.0)
MCH: 32.5 pg (ref 26.0–34.0)
MCHC: 31.9 g/dL (ref 30.0–36.0)
MCV: 102.1 fL — ABNORMAL HIGH (ref 78.0–100.0)
Platelets: 304 10*3/uL (ref 150–400)
RBC: 3.35 MIL/uL — ABNORMAL LOW (ref 3.87–5.11)
RDW: 14 % (ref 11.5–15.5)
WBC: 8.6 10*3/uL (ref 4.0–10.5)

## 2017-09-23 LAB — CBG MONITORING, ED: Glucose-Capillary: 98 mg/dL (ref 65–99)

## 2017-09-23 NOTE — ED Provider Notes (Signed)
[facility] EMERGENCY DEPARTMENT Provider Note   CSN: [id number] Arrival date & time: 09/23/17  2012     History   Chief Complaint Chief Complaint  Patient presents with  . Fall    HPI [name] is a 84 y.o. female.  Patient with history of atrial fibrillation on Eliquis, lung cancer previously on chemotherapy, presents from [facility] living after having a fall.  Patient states that she slipped on the bathroom floor and fell forward striking her left forehead.  No loss of consciousness reported.  She is at her baseline mental status.  She reports feeling very weak currently.  She complains of headache but not neck pain.  No vomiting.  She complains of left hip pain with movement.  She reports not using a walker when ambulating tonight. The onset of this condition was acute. The course is constant. Aggravating factors: none. Alleviating factors: none.       Past Medical History:  Diagnosis Date  . Adenocarcinoma of left lung metastatic to liver ([place]) 04/24/2017  . Anxiety   . Arthritis   . Atrial fibrillation ([place])   . Cardiac arrhythmia due to congenital heart disease   . Chronic constipation   . COPD (chronic obstructive pulmonary disease) ([place])   . Depression   . GERD (gastroesophageal reflux disease)   . Hemorrhoid   . Hyperlipidemia   . Idiopathic hypotension   . Large cell carcinoma of left lung, stage 4 ([place]) 04/24/2017  . Lightheadedness 11/2015  . Lung cancer metastatic to bone ([place]) 04/24/2017  . Mass of lower lobe of right lung 03/25/2017  . Melanoma of skin ([place]) 12/06/2016  . Mycotic toenails 10/27/2012  . Parkinson disease ([place])   . Recurrent UTI   . 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 Varicose veins     Patient Active Problem List   Diagnosis Date Noted  . DNR (do not resuscitate) discussion   . Palliative care by specialist   . Atrial fibrillation with RVR ([place]) 07/22/2017  . Fall 07/22/2017  . Compression fracture of  L1 lumbar vertebra ([place]) 07/22/2017  . Large cell carcinoma of left lung, stage 4 ([place]) 04/24/2017  . Adenocarcinoma of left lung metastatic to liver ([place]) 04/24/2017  . Lung cancer metastatic to bone ([place]) 04/24/2017  . Mass of lower lobe of right lung 03/25/2017  . Melanoma of skin ([place]) 12/06/2016  . Hypokalemia 08/25/2016  . Failure to thrive in adult 04/01/2016  . Blurry vision, right eye   . PCP NOTES >>>>>>>>>>>>>>>>>>>>>>> 03/02/2015  . COPD, group B, by GOLD 2017 classification ([place]) 01/31/2015  . Other fatigue 06/21/2014  . Lightheadedness 06/12/2014  . COPD (chronic obstructive pulmonary disease) ([place]) 09/26/2013  . IBS (irritable bowel syndrome) 04/02/2012  . Goals of care, counseling/discussion 11/25/2011  . Anxiety 07/10/2011  . Parkinson's disease ([place]) 02/03/2011  . Recurrent UTI 11/01/2010  . GERD (gastroesophageal reflux disease) 11/01/2010  . Memory loss 11/01/2010  . Hip pain 09/23/2010  . COMPRESSION FRACTURE, LUMBAR VERTEBRAE 03/28/2010  . Atrial fibrillation ([place]) 03/22/2010  . DIZZINESS 03/22/2010  . Mycotic toenails 10/02/2009  . DYSPNEA ON EXERTION 01/19/2009  . HEMORRHOIDS-INTERNAL 02/14/2008  . Constipation 02/14/2008  . PERSONAL HX COLONIC POLYPS 02/14/2008    Past Surgical History:  Procedure Laterality Date  . APPENDECTOMY  84 years old  . CARDIOVERSION N/A 07/28/2017   Procedure: CARDIOVERSION;  Surgeon: Nigel Mormon, MD;  Location: [facility] ENDOSCOPY;  Service: Cardiovascular;  Laterality: N/A;  . COLON  RESECTION  2008  . melanoma removal Left    left cheek  . vitriectomy  08-2015     OB History   None      Home Medications    Prior to Admission medications   Medication Sig Start Date End Date Taking? Authorizing Provider  acetaminophen (TYLENOL) 325 MG tablet Take 2 tablets (650 mg total) by mouth every 6 (six) hours as needed for mild pain (or Fever >/= 101). 08/29/16   Patrecia Pour, MD  albuterol (VENTOLIN HFA) 108 (90 BASE)  MCG/ACT inhaler Inhale 2 puffs into the lungs 2 (two) times daily as needed for wheezing or shortness of breath. 02/05/15   Midge Minium, MD  ALPRAZolam Duanne Moron) 0.5 MG tablet Take 0.5-1 tablets (0.25-0.5 mg total) by mouth 2 (two) times daily as needed for anxiety. 07/29/17   Nita Sells, MD  apixaban (ELIQUIS) 5 MG TABS tablet Take 5 mg by mouth 2 (two) times daily.    [provider]  Ascorbic Acid (VITAMIN C) 1000 MG tablet Take 1,000 mg by mouth daily.    [provider]  azelastine (ASTELIN) 0.1 % nasal spray Place 2 sprays into both nostrils at bedtime as needed for rhinitis. Use in each nostril as directed 07/14/16   Saguier, Percell Miller, PA-C  Cholecalciferol (VITAMIN D) 400 UNIT/ML LIQD Take 400 Units by mouth daily.     [provider]  diltiazem (CARDIZEM) 30 MG tablet Take 1 tablet (30 mg total) by mouth 3 (three) times daily. 07/29/17   Nita Sells, MD  DULoxetine (CYMBALTA) 30 MG capsule Take 1 capsule (30 mg total) by mouth 2 (two) times daily. 07/29/17   Nita Sells, MD  folic acid (FOLVITE) 1 MG tablet Take 1 tablet (1 mg total) by mouth daily. Take daily starting 5-7 days before Alimta chemo. Continue until 21 days after Alimta completed. 04/28/17   Volanda Napoleon, MD  lactulose (CHRONULAC) 10 GM/15ML solution Take 15 mLs (10 g total) by mouth every 4 (four) hours as needed for mild constipation. Patient taking differently: Take 10 g by mouth 3 (three) times daily as needed for mild constipation.  06/02/17   Volanda Napoleon, MD  levothyroxine (SYNTHROID, LEVOTHROID) 25 MCG tablet Take 0.5 tablets (12.5 mcg total) by mouth daily before breakfast. 07/30/17   Nita Sells, MD  magnesium oxide (MAG-OX) 400 (241.3 Mg) MG tablet Take 2 tablets (800 mg total) by mouth 2 (two) times daily. 07/29/17   Nita Sells, MD  ondansetron (ZOFRAN) 8 MG tablet Take 1 tablet (8 mg total) by mouth every 8 (eight) hours as needed for nausea  or vomiting. 04/07/17   Volanda Napoleon, MD  polyethylene glycol powder (GLYCOLAX/MIRALAX) powder Take 17 g by mouth 2 (two) times daily. Patient taking differently: Take 17 g by mouth 2 (two) times daily as needed (for constipation.).  05/28/16   Waynetta Pean, PA-C  Propylene Glycol (SYSTANE BALANCE OP) Apply 2 drops to eye 3 (three) times daily as needed (for dry/irritated eyes).     [provider]    Family History Family History  Problem Relation Age of Onset  . Asthma Brother   . Alcohol abuse Brother   . Throat cancer Father   . Alcohol abuse Father   . Lupus Daughter   . Bipolar disorder Daughter   . Lupus Daughter   . Anxiety disorder Daughter   . Alcohol abuse Sister   . Alcohol abuse Maternal Grandfather   . Alcohol abuse Paternal  Grandmother   . Breast cancer Sister   . Colon cancer Neg Hx     Social History Social History   Tobacco Use  . Smoking status: Former Smoker    Packs/day: 2.00    Years: 30.00    Pack years: 60.00    Types: Cigarettes    Last attempt to quit: 04/21/1980    Years since quitting: 37.4  . Smokeless tobacco: Never Used  . Tobacco comment: onset age 54 -49, up to > 1ppd (almost 2 ppd)  Substance Use Topics  . Alcohol use: No    Alcohol/week: 0.0 oz  . Drug use: No     Allergies   Adhesive [tape]; Amoxicillin; Ciprofloxacin; and Ultram [tramadol]   Review of Systems Review of Systems  Constitutional: Positive for fatigue.  HENT: Negative for tinnitus.   Eyes: Negative for photophobia, pain and visual disturbance.  Respiratory: Negative for shortness of breath.   Cardiovascular: Negative for chest pain.  Gastrointestinal: Negative for nausea and vomiting.  Musculoskeletal: Positive for arthralgias. Negative for back pain, gait problem and neck pain.  Skin: Positive for wound.  Neurological: Positive for headaches. Negative for dizziness, weakness, light-headedness and numbness.  Psychiatric/Behavioral: Negative for  confusion and decreased concentration.     Physical Exam Updated Vital Signs BP 122/86   Pulse 82   Temp 98 F (36.7 C) (Oral)   Resp (!) 26   Ht 5\' 7"  (1.702 m)   Wt 77.1 kg (170 lb)   SpO2 97%   BMI 26.63 kg/m   Physical Exam  Constitutional: She is oriented to person, place, and time. She appears well-developed and well-nourished.  HENT:  Head: Normocephalic. Head is without raccoon's eyes and without Battle's sign.  Right Ear: Tympanic membrane, external ear and ear canal normal. No hemotympanum.  Left Ear: Tympanic membrane, external ear and ear canal normal. No hemotympanum.  Nose: Nose normal. No nasal septal hematoma.  Mouth/Throat: Uvula is midline, oropharynx is clear and moist and mucous membranes are normal.  1 cm mild linear laceration noted above the left eyebrow.  Eyes: Pupils are equal, round, and reactive to light. Conjunctivae, EOM and lids are normal. Right eye exhibits no nystagmus. Left eye exhibits no nystagmus.  No visible hyphema noted  Neck: Normal range of motion. Neck supple.  Cardiovascular: Normal rate and regular rhythm.  Pulmonary/Chest: Effort normal and breath sounds normal.  Abdominal: Soft. There is no tenderness. There is no rebound and no guarding.  Musculoskeletal:       Right shoulder: Normal.       Left shoulder: Normal.       Right elbow: Normal.      Left elbow: Normal.       Right wrist: Normal.       Left wrist: Normal.       Right hip: Normal.       Left hip: She exhibits tenderness. She exhibits normal range of motion, normal strength and no bony tenderness.       Right knee: Normal.       Left knee: Normal.       Right ankle: Normal.       Left ankle: Normal.       Cervical back: She exhibits normal range of motion, no tenderness and no bony tenderness.       Thoracic back: She exhibits no tenderness and no bony tenderness.       Lumbar back: She exhibits no tenderness and no bony tenderness.  Neurological: She is alert  and oriented to person, place, and time. She has normal strength and normal reflexes. No cranial nerve deficit or sensory deficit. Coordination normal. GCS eye subscore is 4. GCS verbal subscore is 5. GCS motor subscore is 6.  Skin: Skin is warm and dry.  Psychiatric: She has a normal mood and affect.  Nursing note and vitals reviewed.    ED Treatments / Results  Labs (all labs ordered are listed, but only abnormal results are displayed) Labs Reviewed  BASIC METABOLIC PANEL - Abnormal; Notable for the following components:      Result Value   Chloride 100 (*)    Creatinine, Ser 1.05 (*)    Calcium 8.8 (*)    GFR calc non Af Amer 46 (*)    GFR calc Af Amer 54 (*)    All other components within normal limits  CBC - Abnormal; Notable for the following components:   RBC 3.35 (*)    Hemoglobin 10.9 (*)    HCT 34.2 (*)    MCV 102.1 (*)    All other components within normal limits  URINALYSIS, ROUTINE W REFLEX MICROSCOPIC  CBG MONITORING, ED    EKG EKG Interpretation  Date/Time:  Wednesday September 23 2017 21:01:27 EDT Ventricular Rate:  87 PR Interval:    QRS Duration: 70 QT Interval:  449 QTC Calculation: 541 R Axis:   47 Text Interpretation:  Atrial fibrillation Low voltage, extremity and precordial leads Nonspecific T abnormalities, lateral leads Prolonged QT interval no significant change since earlier in the day Confirmed by Sherwood Gambler [phone]) on 09/23/2017 11:11:54 PM   Radiology Ct Head Wo Contrast  Result Date: 09/23/2017 CLINICAL DATA:  Slip and fall in the bathroom floor tonight. No loss of consciousness. EXAM: CT HEAD WITHOUT CONTRAST CT CERVICAL SPINE WITHOUT CONTRAST TECHNIQUE: Multidetector CT imaging of the head and cervical spine was performed following the standard protocol without intravenous contrast. Multiplanar CT image reconstructions of the cervical spine were also generated. COMPARISON:  CT 07/21/2017 FINDINGS: CT HEAD FINDINGS Brain: Stable degree of  atrophy. Stable advanced chronic small vessel ischemia. No intracranial hemorrhage, mass effect, or midline shift. No hydrocephalus. The basilar cisterns are patent. No evidence of territorial infarct or acute ischemia. No extra-axial or intracranial fluid collection. Vascular: Atherosclerosis of skullbase vasculature without hyperdense vessel or abnormal calcification. Skull: Left supraorbital and frontal scalp hematoma. No skull fracture. Sinuses/Orbits: Minimal opacification of lower mastoid air cells bilaterally. Paranasal sinuses are clear. No visualized orbital fracture. Postsurgical change in the left lobe. Other: None. CT CERVICAL SPINE FINDINGS Alignment: Unchanged with similar trace anterolisthesis of C3 on C4. Nno traumatic subluxation. Skull base and vertebrae: No acute fracture. Vertebral body heights are maintained. The dens and skull base are intact. Pannus at C1-C2 articulation, unchanged. Soft tissues and spinal canal: No prevertebral fluid or swelling. No visible canal hematoma. Disc levels: Diffuse disc space narrowing and endplate spurring, prominent C4-C5, C5-C6, and C6-C7. Multilevel facet arthropathy. Upper chest: Right pleural effusion, similar to recent PET. Other: Carotid calcifications. IMPRESSION: 1. Left frontal scalp hematoma without skull fracture or acute intracranial abnormality. 2. Unchanged atrophy and chronic small vessel ischemia. 3. Multilevel degenerative change in the cervical spine without acute fracture. Electronically Signed   By: Jeb Levering M.D.   On: 09/23/2017 22:01   Ct Cervical Spine Wo Contrast  Result Date: 09/23/2017 CLINICAL DATA:  Slip and fall in the bathroom floor tonight. No loss of consciousness. EXAM: CT HEAD WITHOUT CONTRAST CT  CERVICAL SPINE WITHOUT CONTRAST TECHNIQUE: Multidetector CT imaging of the head and cervical spine was performed following the standard protocol without intravenous contrast. Multiplanar CT image reconstructions of the  cervical spine were also generated. COMPARISON:  CT 07/21/2017 FINDINGS: CT HEAD FINDINGS Brain: Stable degree of atrophy. Stable advanced chronic small vessel ischemia. No intracranial hemorrhage, mass effect, or midline shift. No hydrocephalus. The basilar cisterns are patent. No evidence of territorial infarct or acute ischemia. No extra-axial or intracranial fluid collection. Vascular: Atherosclerosis of skullbase vasculature without hyperdense vessel or abnormal calcification. Skull: Left supraorbital and frontal scalp hematoma. No skull fracture. Sinuses/Orbits: Minimal opacification of lower mastoid air cells bilaterally. Paranasal sinuses are clear. No visualized orbital fracture. Postsurgical change in the left lobe. Other: None. CT CERVICAL SPINE FINDINGS Alignment: Unchanged with similar trace anterolisthesis of C3 on C4. Nno traumatic subluxation. Skull base and vertebrae: No acute fracture. Vertebral body heights are maintained. The dens and skull base are intact. Pannus at C1-C2 articulation, unchanged. Soft tissues and spinal canal: No prevertebral fluid or swelling. No visible canal hematoma. Disc levels: Diffuse disc space narrowing and endplate spurring, prominent C4-C5, C5-C6, and C6-C7. Multilevel facet arthropathy. Upper chest: Right pleural effusion, similar to recent PET. Other: Carotid calcifications. IMPRESSION: 1. Left frontal scalp hematoma without skull fracture or acute intracranial abnormality. 2. Unchanged atrophy and chronic small vessel ischemia. 3. Multilevel degenerative change in the cervical spine without acute fracture. Electronically Signed   By: Jeb Levering M.D.   On: 09/23/2017 22:01   Dg Hip Unilat W Or Wo Pelvis 2-3 Views Left  Result Date: 09/23/2017 CLINICAL DATA:  Slip and fall with left hip pain EXAM: DG HIP (WITH OR WITHOUT PELVIS) 3V LEFT COMPARISON:  None. FINDINGS: Pelvic ring is intact. Degenerative changes of the hip joints are noted bilaterally. No acute  fracture or dislocation is seen. No soft tissue abnormality is noted. Degenerative changes of the lumbar spine are seen. IMPRESSION: Degenerative change without acute abnormality. Electronically Signed   By: Inez Catalina M.D.   On: 09/23/2017 21:40    Procedures .Marland KitchenLaceration Repair Date/Time: 09/23/2017 11:54 PM Performed by: Carlisle Cater, PA-C Authorized by: Carlisle Cater, PA-C   Consent:    Consent obtained:  Verbal   Consent given by:  Patient   Risks discussed:  Pain Anesthesia (see MAR for exact dosages):    Anesthesia method:  None Laceration details:    Location:  Face   Face location:  L eyebrow   Length (cm):  1 Repair type:    Repair type:  Simple Exploration:    Hemostasis achieved with:  Direct pressure   Contaminated: no   Treatment:    Area cleansed with:  Shur-Clens   Amount of cleaning:  Standard Skin repair:    Repair method:  Tissue adhesive Approximation:    Approximation:  Close Post-procedure details:    Dressing:  Open (no dressing)   Patient tolerance of procedure:  Tolerated well, no immediate complications   (including critical care time)  Medications Ordered in ED Medications - No data to display   Initial Impression / Assessment and Plan / ED Course  I have reviewed the triage vital signs and the nursing notes.  Pertinent labs & imaging results that were available during my care of the patient were reviewed by me and considered in my medical decision making (see chart for details).     Patient seen and examined. Work-up initiated. Medications ordered.   Vital signs reviewed and are as  follows: BP 122/86   Pulse 82   Temp 98 F (36.7 C) (Oral)   Resp (!) 26   Ht 5\' 7"  (1.702 m)   Wt 77.1 kg (170 lb)   SpO2 97%   BMI 26.63 kg/m   11:54 PM imaging negative.  Wound repaired as above.  Patient updated on results.  Will obtain transportation home.  Encourage PCP follow-up for recheck in the next week.  Final Clinical  Impressions(s) / ED Diagnoses   Final diagnoses:  Fall, initial encounter  Minor head injury, initial encounter  Facial laceration, initial encounter   Fall: Slip and fall, patient is alert and is a good historian. No LOC.   Head/neck injury: CT head/c-spine neg. Neg hip films, good ROM.   Facial laceration: Repaired without complication.   ED Discharge Orders    None       Carlisle Cater, Hershal Coria 09/23/17 2357    Sherwood Gambler, MD 09/28/17 [phone]

## 2017-09-23 NOTE — ED Triage Notes (Signed)
Per [facility], pt arriving from [facility] after slipping on bathroom floor and having a fall. Pt has hx of fall and a fib. Pt on eliquis. Pt has hematoma to left horehead, c/o 8/10 HA. Pt c/o bilateral lower leg pain which started in December after a fall. Pt has skin tear to left elbow. Pt denies losing consciousness. Facility reports pt at baseline mentally. Pt alert and oriented and following commands.

## 2017-09-23 NOTE — ED Notes (Signed)
Attempted IV x2 w/ no success. Unable to get lab work as well.

## 2017-09-23 NOTE — Discharge Instructions (Signed)
Please read and follow all provided instructions.  Your diagnoses today include:  1. Fall, initial encounter   2. Minor head injury, initial encounter   3. Facial laceration, initial encounter     Tests performed today include:  CT scan of your head and neck that did not show any serious injury.  X-ray of your hip - no fracture  Blood clots and electrolytes  Vital signs. See below for your results today.   Medications prescribed:   None  Take any prescribed medications only as directed.  Home care instructions:  Follow any educational materials contained in this packet.  Follow-up instructions: Please follow-up with your primary care provider in the next 3 days for further evaluation of your symptoms.   Return instructions:  SEEK IMMEDIATE MEDICAL ATTENTION IF:  There is confusion or drowsiness (although children frequently become drowsy after injury).   You cannot awaken the injured person.   You have more than one episode of vomiting.   You notice dizziness or unsteadiness which is getting worse, or inability to walk.   You have convulsions or unconsciousness.   You experience severe, persistent headaches not relieved by Tylenol.  You cannot use arms or legs normally.   There are changes in pupil sizes. (This is the black center in the colored part of the eye)   There is clear or bloody discharge from the nose or ears.   You have change in speech, vision, swallowing, or understanding.   Localized weakness, numbness, tingling, or change in bowel or bladder control.  You have any other emergent concerns.  Additional Information: You have had a head injury which does not appear to require admission at this time.  Your vital signs today were: BP 117/86    Pulse 90    Temp 98 F (36.7 C) (Oral)    Resp (!) 26    Ht 5\' 7"  (1.702 m)    Wt 77.1 kg (170 lb)    SpO2 96%    BMI 26.63 kg/m  If your blood pressure (BP) was elevated above 135/85 this visit, please  have this repeated by your doctor within one month. --------------

## 2017-09-23 NOTE — ED Notes (Signed)
Delay in lab draw,  Pt not in room 

## 2017-09-24 ENCOUNTER — Ambulatory Visit (HOSPITAL_COMMUNITY): Payer: Medicare Other

## 2017-09-24 DIAGNOSIS — S0532XD Ocular laceration without prolapse or loss of intraocular tissue, left eye, subsequent encounter: Secondary | ICD-10-CM | POA: Diagnosis not present

## 2017-09-24 DIAGNOSIS — C7951 Secondary malignant neoplasm of bone: Secondary | ICD-10-CM | POA: Diagnosis not present

## 2017-09-24 DIAGNOSIS — R234 Changes in skin texture: Secondary | ICD-10-CM | POA: Diagnosis not present

## 2017-09-24 DIAGNOSIS — C3492 Malignant neoplasm of unspecified part of left bronchus or lung: Secondary | ICD-10-CM | POA: Diagnosis not present

## 2017-09-24 DIAGNOSIS — I509 Heart failure, unspecified: Secondary | ICD-10-CM | POA: Diagnosis not present

## 2017-09-24 DIAGNOSIS — S41112D Laceration without foreign body of left upper arm, subsequent encounter: Secondary | ICD-10-CM | POA: Diagnosis not present

## 2017-09-25 ENCOUNTER — Ambulatory Visit (HOSPITAL_COMMUNITY)
Admission: RE | Admit: 2017-09-25 | Discharge: 2017-09-25 | Disposition: A | Discharge status: Home / Self Care | Payer: Medicare Other | Source: Ambulatory Visit | Attending: Radiology | Admitting: Radiology

## 2017-09-25 ENCOUNTER — Ambulatory Visit (HOSPITAL_COMMUNITY)
Admission: RE | Admit: 2017-09-25 | Discharge: 2017-09-25 | Disposition: A | Discharge status: Home / Self Care | Payer: Medicare Other | Source: Ambulatory Visit | Attending: Hematology & Oncology | Admitting: Hematology & Oncology

## 2017-09-25 DIAGNOSIS — J9811 Atelectasis: Secondary | ICD-10-CM | POA: Diagnosis not present

## 2017-09-25 DIAGNOSIS — C3492 Malignant neoplasm of unspecified part of left bronchus or lung: Secondary | ICD-10-CM | POA: Insufficient documentation

## 2017-09-25 DIAGNOSIS — J90 Pleural effusion, not elsewhere classified: Secondary | ICD-10-CM | POA: Diagnosis not present

## 2017-09-25 DIAGNOSIS — Z9889 Other specified postprocedural states: Secondary | ICD-10-CM | POA: Diagnosis not present

## 2017-09-25 DIAGNOSIS — R846 Abnormal cytological findings in specimens from respiratory organs and thorax: Secondary | ICD-10-CM | POA: Diagnosis not present

## 2017-09-25 MED ORDER — LIDOCAINE HCL 1 % IJ SOLN
INTRAMUSCULAR | Status: AC
  Filled 2017-09-25: qty 20

## 2017-09-25 NOTE — Procedures (Signed)
Ultrasound-guided diagnostic and therapeutic right thoracentesis performed yielding 850 cc of yellow fluid. No immediate complications. Follow-up chest x-ray pending. The fluid was sent to the lab for cytology

## 2017-09-28 DIAGNOSIS — S41112D Laceration without foreign body of left upper arm, subsequent encounter: Secondary | ICD-10-CM | POA: Diagnosis not present

## 2017-09-28 DIAGNOSIS — I509 Heart failure, unspecified: Secondary | ICD-10-CM | POA: Diagnosis not present

## 2017-09-28 DIAGNOSIS — S0532XD Ocular laceration without prolapse or loss of intraocular tissue, left eye, subsequent encounter: Secondary | ICD-10-CM | POA: Diagnosis not present

## 2017-09-28 DIAGNOSIS — R234 Changes in skin texture: Secondary | ICD-10-CM | POA: Diagnosis not present

## 2017-09-28 DIAGNOSIS — C7951 Secondary malignant neoplasm of bone: Secondary | ICD-10-CM | POA: Diagnosis not present

## 2017-09-28 DIAGNOSIS — C3492 Malignant neoplasm of unspecified part of left bronchus or lung: Secondary | ICD-10-CM | POA: Diagnosis not present

## 2017-09-29 ENCOUNTER — Telehealth: Payer: Self-pay | Admitting: Medical

## 2017-09-29 DIAGNOSIS — R234 Changes in skin texture: Secondary | ICD-10-CM | POA: Diagnosis not present

## 2017-09-29 DIAGNOSIS — S0532XD Ocular laceration without prolapse or loss of intraocular tissue, left eye, subsequent encounter: Secondary | ICD-10-CM | POA: Diagnosis not present

## 2017-09-29 DIAGNOSIS — I509 Heart failure, unspecified: Secondary | ICD-10-CM | POA: Diagnosis not present

## 2017-09-29 DIAGNOSIS — C7951 Secondary malignant neoplasm of bone: Secondary | ICD-10-CM | POA: Diagnosis not present

## 2017-09-29 DIAGNOSIS — C3492 Malignant neoplasm of unspecified part of left bronchus or lung: Secondary | ICD-10-CM | POA: Diagnosis not present

## 2017-09-29 DIAGNOSIS — S41112D Laceration without foreign body of left upper arm, subsequent encounter: Secondary | ICD-10-CM | POA: Diagnosis not present

## 2017-09-29 NOTE — Telephone Encounter (Signed)
Copied from [place] [id number]. Topic: Quick Communication - See Telephone Encounter >> Sep 29, 2017  2:49 PM Synthia Innocent wrote: CRM for notification. See Telephone encounter for: 09/29/17. [facility] calling for verbal PT Orders, 1x a week for 1 week, 2x a week for 4 weeks effective 09/24/17

## 2017-09-30 ENCOUNTER — Telehealth: Payer: Self-pay | Admitting: Medical

## 2017-09-30 ENCOUNTER — Telehealth: Payer: Self-pay | Admitting: *Deleted

## 2017-09-30 NOTE — Telephone Encounter (Addendum)
Message left on personal voice mail of guardian, [name].  ----- Message from Volanda Napoleon, MD sent at 09/30/2017  1:41 PM EDT ----- Call - NO cancer cells in the lung fluid!!  Laurey Arrow

## 2017-09-30 NOTE — Telephone Encounter (Signed)
CRM for notification. See Telephone encounter for: 09/29/17. [facility] calling for verbal PT Orders, 1x a week for 1 week, 2x a week for 4 weeks effective 09/24/17.  Could not place this in encounter note. So had to sent telephone note.  But can give verbal authorization.

## 2017-09-30 NOTE — Telephone Encounter (Signed)
Opened to review 

## 2017-10-01 DIAGNOSIS — C3492 Malignant neoplasm of unspecified part of left bronchus or lung: Secondary | ICD-10-CM | POA: Diagnosis not present

## 2017-10-01 DIAGNOSIS — R234 Changes in skin texture: Secondary | ICD-10-CM | POA: Diagnosis not present

## 2017-10-01 DIAGNOSIS — I509 Heart failure, unspecified: Secondary | ICD-10-CM | POA: Diagnosis not present

## 2017-10-01 DIAGNOSIS — S0532XD Ocular laceration without prolapse or loss of intraocular tissue, left eye, subsequent encounter: Secondary | ICD-10-CM | POA: Diagnosis not present

## 2017-10-01 DIAGNOSIS — C7951 Secondary malignant neoplasm of bone: Secondary | ICD-10-CM | POA: Diagnosis not present

## 2017-10-01 DIAGNOSIS — S41112D Laceration without foreign body of left upper arm, subsequent encounter: Secondary | ICD-10-CM | POA: Diagnosis not present

## 2017-10-01 NOTE — Telephone Encounter (Signed)
Left [facility] a message to call back.

## 2017-10-01 NOTE — Telephone Encounter (Signed)
Called [facility] and let message to call back.

## 2017-10-05 ENCOUNTER — Ambulatory Visit: Payer: Self-pay | Admitting: Pulmonary Disease

## 2017-10-06 DIAGNOSIS — S41112D Laceration without foreign body of left upper arm, subsequent encounter: Secondary | ICD-10-CM | POA: Diagnosis not present

## 2017-10-06 DIAGNOSIS — C3492 Malignant neoplasm of unspecified part of left bronchus or lung: Secondary | ICD-10-CM | POA: Diagnosis not present

## 2017-10-06 DIAGNOSIS — R234 Changes in skin texture: Secondary | ICD-10-CM | POA: Diagnosis not present

## 2017-10-06 DIAGNOSIS — C7951 Secondary malignant neoplasm of bone: Secondary | ICD-10-CM | POA: Diagnosis not present

## 2017-10-06 DIAGNOSIS — S0532XD Ocular laceration without prolapse or loss of intraocular tissue, left eye, subsequent encounter: Secondary | ICD-10-CM | POA: Diagnosis not present

## 2017-10-06 DIAGNOSIS — I509 Heart failure, unspecified: Secondary | ICD-10-CM | POA: Diagnosis not present

## 2017-10-09 DIAGNOSIS — S41112D Laceration without foreign body of left upper arm, subsequent encounter: Secondary | ICD-10-CM | POA: Diagnosis not present

## 2017-10-09 DIAGNOSIS — I509 Heart failure, unspecified: Secondary | ICD-10-CM | POA: Diagnosis not present

## 2017-10-09 DIAGNOSIS — S0532XD Ocular laceration without prolapse or loss of intraocular tissue, left eye, subsequent encounter: Secondary | ICD-10-CM | POA: Diagnosis not present

## 2017-10-09 DIAGNOSIS — C7951 Secondary malignant neoplasm of bone: Secondary | ICD-10-CM | POA: Diagnosis not present

## 2017-10-09 DIAGNOSIS — C3492 Malignant neoplasm of unspecified part of left bronchus or lung: Secondary | ICD-10-CM | POA: Diagnosis not present

## 2017-10-09 DIAGNOSIS — R234 Changes in skin texture: Secondary | ICD-10-CM | POA: Diagnosis not present

## 2017-10-13 DIAGNOSIS — I509 Heart failure, unspecified: Secondary | ICD-10-CM | POA: Diagnosis not present

## 2017-10-13 DIAGNOSIS — C3492 Malignant neoplasm of unspecified part of left bronchus or lung: Secondary | ICD-10-CM | POA: Diagnosis not present

## 2017-10-13 DIAGNOSIS — Z993 Dependence on wheelchair: Secondary | ICD-10-CM | POA: Diagnosis not present

## 2017-10-13 DIAGNOSIS — S41112D Laceration without foreign body of left upper arm, subsequent encounter: Secondary | ICD-10-CM | POA: Diagnosis not present

## 2017-10-13 DIAGNOSIS — F0391 Unspecified dementia with behavioral disturbance: Secondary | ICD-10-CM | POA: Diagnosis not present

## 2017-10-13 DIAGNOSIS — C3490 Malignant neoplasm of unspecified part of unspecified bronchus or lung: Secondary | ICD-10-CM | POA: Diagnosis not present

## 2017-10-13 DIAGNOSIS — S0532XD Ocular laceration without prolapse or loss of intraocular tissue, left eye, subsequent encounter: Secondary | ICD-10-CM | POA: Diagnosis not present

## 2017-10-13 DIAGNOSIS — C7951 Secondary malignant neoplasm of bone: Secondary | ICD-10-CM | POA: Diagnosis not present

## 2017-10-13 DIAGNOSIS — R234 Changes in skin texture: Secondary | ICD-10-CM | POA: Diagnosis not present

## 2017-10-13 DIAGNOSIS — R269 Unspecified abnormalities of gait and mobility: Secondary | ICD-10-CM | POA: Diagnosis not present

## 2017-10-14 ENCOUNTER — Non-Acute Institutional Stay: Payer: BLUE CROSS/BLUE SHIELD | Admitting: Licensed Clinical Social Worker

## 2017-10-14 ENCOUNTER — Non-Acute Institutional Stay: Payer: Medicare Other | Admitting: *Deleted

## 2017-10-14 VITALS — BP 90/52 | HR 75 | Resp 16

## 2017-10-14 DIAGNOSIS — Z515 Encounter for palliative care: Secondary | ICD-10-CM

## 2017-10-14 NOTE — Progress Notes (Signed)
COMMUNITY PALLIATIVE CARE SW NOTE  PATIENT NAME: [name] DOB: 10-11-1927 MRN: [id number]  PRIMARY CARE PROVIDER: Mackie Pai, PA-C  RESPONSIBLE PARTY:  Acct ID - Guarantor Home Phone Work Phone Relationship Acct Type  [id number] [name]  Self P/F     [address], [place], [place] [address]     PLAN OF CARE and INTERVENTIONS:             1. GOALS OF CARE/ ADVANCE CARE PLANNING:  Patient wishes to remain at [facility].  She has a DNR. 2. SOCIAL/EMOTIONAL/SPIRITUAL ASSESSMENT/ INTERVENTIONS:  SW and Palliative Care RN, Daryl Eastern, met with patient in her apartment at [facility].  Patient was in her w/c in her room and stated she just woke up.  Patient said she is sleeping more during the day.  Patient engaged in conversation and denied pain.  She did complain of some itching and will follow up with her MD.  Some of her thoughts appeared disjointed and she had difficulty finding words.  Patient was oriented to place and self, but not date.  She enjoys attending facility activities and stated she misses her cat.  She was unclear when she was admitted to the facility.  She has a daughter in [place] and a daughter in [place].  Patient complained of being lonely at times.  She has attended church at the facility, but would not state what religion she was.  She previously lived in a townhouse in [place], but moved after she fell at home. 3. PATIENT/CAREGIVER EDUCATION/ COPING:  Patient copes by expressing her thoughts and feelings openly.  SW and RN provided education regarding Palliative Care.  She stated she understood. 4. PERSONAL EMERGENCY PLAN:  Patient uses her facility pendent wish alerts facility staff if she has an emergency. 5. COMMUNITY RESOURCES COORDINATION/ HEALTH CARE NAVIGATION:  None per patient. 6. FINANCIAL/LEGAL CONCERNS/INTERVENTIONS:  None per patient.     SOCIAL HX:  Social History   Tobacco Use  . Smoking status: Former Smoker     Packs/day: 2.00    Years: 30.00    Pack years: 60.00    Types: Cigarettes    Last attempt to quit: 04/21/1980    Years since quitting: 37.5  . Smokeless tobacco: Never Used  . Tobacco comment: onset age 34 -40, up to > 1ppd (almost 2 ppd)  Substance Use Topics  . Alcohol use: No    Alcohol/week: 0.0 oz    CODE STATUS:  DNR  ADVANCED DIRECTIVES: N MOST FORM COMPLETE:  N HOSPICE EDUCATION PROVIDED: Not during current visit.  PPS:  Patient reports her appetite is normal.  She can ambulate up the hall with her rotator. Duration of visit and documentation:  90 minutes.     Creola Corn Zaray Gatchel, LCSW

## 2017-10-15 DIAGNOSIS — S41112D Laceration without foreign body of left upper arm, subsequent encounter: Secondary | ICD-10-CM | POA: Diagnosis not present

## 2017-10-15 DIAGNOSIS — C3492 Malignant neoplasm of unspecified part of left bronchus or lung: Secondary | ICD-10-CM | POA: Diagnosis not present

## 2017-10-15 DIAGNOSIS — C7951 Secondary malignant neoplasm of bone: Secondary | ICD-10-CM | POA: Diagnosis not present

## 2017-10-15 DIAGNOSIS — I509 Heart failure, unspecified: Secondary | ICD-10-CM | POA: Diagnosis not present

## 2017-10-15 DIAGNOSIS — S0532XD Ocular laceration without prolapse or loss of intraocular tissue, left eye, subsequent encounter: Secondary | ICD-10-CM | POA: Diagnosis not present

## 2017-10-15 DIAGNOSIS — R234 Changes in skin texture: Secondary | ICD-10-CM | POA: Diagnosis not present

## 2017-10-15 NOTE — Progress Notes (Signed)
COMMUNITY PALLIATIVE CARE RN NOTE  PATIENT NAME: [name] DOB: 1928-01-17 MRN: [id number]  PRIMARY CARE PROVIDER: Mackie Pai, PA-C  RESPONSIBLE PARTY:  Acct ID - Guarantor Home Phone Work Phone Relationship Acct Type  [id number] Posey Rea414 [phone]  Self P/F     [address], [place], [place] [address]    PLAN OF CARE and INTERVENTION:  1. ADVANCE CARE PLANNING/GOALS OF CARE: Avoid hospitalizations, Remain at [facility] facility 2. PATIENT/CAREGIVER EDUCATION: Explained Palliative Services, Reinforced Safety/Fall Precautions 3. DISEASE STATUS: Joint visit made with [place]. Patient sitting in her wheelchair in her room and states that she just woke up and thought it was later in the day than what it was. Patient very pleasant and engaging in conversation. Patient is intermittently confused and has difficulty staying focused on current conversation at times, however is able to be redirected and makes needs known. Patient propels herself in her wheelchair. Requires 1 person assistance with standing, transfers, bathing and dressing for safety. Wears an emergency pendant necklace in case of falls or needing assistance. Last fall on 09/23/17 and patient was sent to the ED with a laceration noted near L eyebrow. No falls since this occurrence. Patient states that she is able to ambulate with assistance a short distance using her rollator walker in the hallway. Patient is incontinent of bladder and wears Depends. Patient eating 3 meals/day and is able to feed herself independently. States that she loves going outside and misses her cat. Denies pain during visit. No dyspnea or anxiety noted. C/o itching mainly to her arms and sometimes scratches her arms until she bleeds. Patient welcoming of future visits. Will continue to visit patient on a monthly basis.   HISTORY OF PESENT ILLNESS: This is a 84 yo female who recently moved from her townhome and  currently resides at [facility] facility due to requiring a higher level of care. Palliative Care to continue to follow patient to assess overall condition and assist with symptom management needs. Next visit scheduled for 1 month.  CODE STATUS: DNR  ADVANCED DIRECTIVES: Y MOST FORM: yes PPS: 30%   PHYSICAL EXAM:   VITALS: Today's Vitals   10/14/17 1113  BP: (!) 90/52  Pulse: 75  Resp: 16  SpO2: 95%  PainSc: 0-No pain    LUNGS: clear to auscultation  CARDIAC: Cor RRR EXTREMITIES: Trace edema in bilateral lower extremities SKIN: Exposed skin dry and intact, bruising/yellowing noted to L face near eye from fall 3 weeks ago, small scratches noted to L arm  NEURO: Alert and oriented to person and place, pleasant mood, intermittent confusion, propels self in wheelchair for locomotion   (Duration of visit and documentation 90 minutes)    Daryl Eastern, RN, BSN

## 2017-10-16 DIAGNOSIS — I509 Heart failure, unspecified: Secondary | ICD-10-CM | POA: Diagnosis not present

## 2017-10-16 DIAGNOSIS — S0532XD Ocular laceration without prolapse or loss of intraocular tissue, left eye, subsequent encounter: Secondary | ICD-10-CM | POA: Diagnosis not present

## 2017-10-16 DIAGNOSIS — C3492 Malignant neoplasm of unspecified part of left bronchus or lung: Secondary | ICD-10-CM | POA: Diagnosis not present

## 2017-10-16 DIAGNOSIS — R234 Changes in skin texture: Secondary | ICD-10-CM | POA: Diagnosis not present

## 2017-10-16 DIAGNOSIS — C7951 Secondary malignant neoplasm of bone: Secondary | ICD-10-CM | POA: Diagnosis not present

## 2017-10-16 DIAGNOSIS — S41112D Laceration without foreign body of left upper arm, subsequent encounter: Secondary | ICD-10-CM | POA: Diagnosis not present

## 2017-10-18 DIAGNOSIS — I509 Heart failure, unspecified: Secondary | ICD-10-CM | POA: Diagnosis not present

## 2017-10-18 DIAGNOSIS — S0532XD Ocular laceration without prolapse or loss of intraocular tissue, left eye, subsequent encounter: Secondary | ICD-10-CM | POA: Diagnosis not present

## 2017-10-18 DIAGNOSIS — R234 Changes in skin texture: Secondary | ICD-10-CM | POA: Diagnosis not present

## 2017-10-18 DIAGNOSIS — C3492 Malignant neoplasm of unspecified part of left bronchus or lung: Secondary | ICD-10-CM | POA: Diagnosis not present

## 2017-10-18 DIAGNOSIS — C7951 Secondary malignant neoplasm of bone: Secondary | ICD-10-CM | POA: Diagnosis not present

## 2017-10-18 DIAGNOSIS — S41112D Laceration without foreign body of left upper arm, subsequent encounter: Secondary | ICD-10-CM | POA: Diagnosis not present

## 2017-10-19 DIAGNOSIS — S0532XD Ocular laceration without prolapse or loss of intraocular tissue, left eye, subsequent encounter: Secondary | ICD-10-CM | POA: Diagnosis not present

## 2017-10-19 DIAGNOSIS — I509 Heart failure, unspecified: Secondary | ICD-10-CM | POA: Diagnosis not present

## 2017-10-19 DIAGNOSIS — R234 Changes in skin texture: Secondary | ICD-10-CM | POA: Diagnosis not present

## 2017-10-19 DIAGNOSIS — C3492 Malignant neoplasm of unspecified part of left bronchus or lung: Secondary | ICD-10-CM | POA: Diagnosis not present

## 2017-10-19 DIAGNOSIS — C7951 Secondary malignant neoplasm of bone: Secondary | ICD-10-CM | POA: Diagnosis not present

## 2017-10-19 DIAGNOSIS — S41112D Laceration without foreign body of left upper arm, subsequent encounter: Secondary | ICD-10-CM | POA: Diagnosis not present

## 2017-10-20 DIAGNOSIS — I509 Heart failure, unspecified: Secondary | ICD-10-CM | POA: Diagnosis not present

## 2017-10-20 DIAGNOSIS — R234 Changes in skin texture: Secondary | ICD-10-CM | POA: Diagnosis not present

## 2017-10-20 DIAGNOSIS — E039 Hypothyroidism, unspecified: Secondary | ICD-10-CM | POA: Diagnosis not present

## 2017-10-20 DIAGNOSIS — C3492 Malignant neoplasm of unspecified part of left bronchus or lung: Secondary | ICD-10-CM | POA: Diagnosis not present

## 2017-10-20 DIAGNOSIS — C7951 Secondary malignant neoplasm of bone: Secondary | ICD-10-CM | POA: Diagnosis not present

## 2017-10-20 DIAGNOSIS — Z79899 Other long term (current) drug therapy: Secondary | ICD-10-CM | POA: Diagnosis not present

## 2017-10-20 DIAGNOSIS — S41112D Laceration without foreign body of left upper arm, subsequent encounter: Secondary | ICD-10-CM | POA: Diagnosis not present

## 2017-10-20 DIAGNOSIS — S0532XD Ocular laceration without prolapse or loss of intraocular tissue, left eye, subsequent encounter: Secondary | ICD-10-CM | POA: Diagnosis not present

## 2017-10-20 DIAGNOSIS — I4891 Unspecified atrial fibrillation: Secondary | ICD-10-CM | POA: Diagnosis not present

## 2017-10-21 ENCOUNTER — Non-Acute Institutional Stay: Payer: Medicare Other | Admitting: *Deleted

## 2017-10-21 VITALS — BP 102/58 | HR 79 | Resp 16

## 2017-10-21 DIAGNOSIS — E039 Hypothyroidism, unspecified: Secondary | ICD-10-CM | POA: Diagnosis not present

## 2017-10-21 DIAGNOSIS — C7951 Secondary malignant neoplasm of bone: Secondary | ICD-10-CM | POA: Diagnosis not present

## 2017-10-21 DIAGNOSIS — S41112D Laceration without foreign body of left upper arm, subsequent encounter: Secondary | ICD-10-CM | POA: Diagnosis not present

## 2017-10-21 DIAGNOSIS — L905 Scar conditions and fibrosis of skin: Secondary | ICD-10-CM | POA: Diagnosis not present

## 2017-10-21 DIAGNOSIS — G20 Parkinson's disease: Secondary | ICD-10-CM

## 2017-10-21 DIAGNOSIS — S0532XD Ocular laceration without prolapse or loss of intraocular tissue, left eye, subsequent encounter: Secondary | ICD-10-CM | POA: Diagnosis not present

## 2017-10-21 DIAGNOSIS — F330 Major depressive disorder, recurrent, mild: Secondary | ICD-10-CM

## 2017-10-21 DIAGNOSIS — I509 Heart failure, unspecified: Secondary | ICD-10-CM | POA: Diagnosis not present

## 2017-10-21 DIAGNOSIS — Z515 Encounter for palliative care: Secondary | ICD-10-CM

## 2017-10-21 DIAGNOSIS — R234 Changes in skin texture: Secondary | ICD-10-CM | POA: Diagnosis not present

## 2017-10-21 DIAGNOSIS — R4181 Age-related cognitive decline: Secondary | ICD-10-CM | POA: Diagnosis not present

## 2017-10-21 DIAGNOSIS — F419 Anxiety disorder, unspecified: Secondary | ICD-10-CM | POA: Diagnosis not present

## 2017-10-21 DIAGNOSIS — C3492 Malignant neoplasm of unspecified part of left bronchus or lung: Secondary | ICD-10-CM | POA: Diagnosis not present

## 2017-10-23 DIAGNOSIS — F330 Major depressive disorder, recurrent, mild: Secondary | ICD-10-CM | POA: Insufficient documentation

## 2017-10-23 DIAGNOSIS — C7951 Secondary malignant neoplasm of bone: Secondary | ICD-10-CM | POA: Diagnosis not present

## 2017-10-23 DIAGNOSIS — C3492 Malignant neoplasm of unspecified part of left bronchus or lung: Secondary | ICD-10-CM | POA: Diagnosis not present

## 2017-10-23 DIAGNOSIS — I509 Heart failure, unspecified: Secondary | ICD-10-CM | POA: Diagnosis not present

## 2017-10-23 DIAGNOSIS — S41112D Laceration without foreign body of left upper arm, subsequent encounter: Secondary | ICD-10-CM | POA: Diagnosis not present

## 2017-10-23 DIAGNOSIS — S0532XD Ocular laceration without prolapse or loss of intraocular tissue, left eye, subsequent encounter: Secondary | ICD-10-CM | POA: Diagnosis not present

## 2017-10-23 DIAGNOSIS — R234 Changes in skin texture: Secondary | ICD-10-CM | POA: Diagnosis not present

## 2017-10-23 NOTE — Progress Notes (Signed)
COMMUNITY PALLIATIVE CARE RN NOTE  PATIENT NAME: [name] DOB: [birth year] MRN: [id number]  PRIMARY CARE PROVIDER: Mackie Pai, PA-C  RESPONSIBLE PARTY:  Acct ID - Guarantor Home Phone Work Phone Relationship Acct Type  [id number] Posey [phone]  Self P/F     [address], [place], [place] [address]    PLAN OF CARE and INTERVENTION:  1. ADVANCE CARE PLANNING/GOALS OF CARE: Remain at [place] [facility] at [facility] and avoid hospitalizations 2. PATIENT/CAREGIVER EDUCATION: Reinforced Safety/Fall Precautions 3. DISEASE STATUS: Upon arrival, patient sitting up in her wheelchair playing Bingo with other residents. Patient very pleasant and conversational. Some intermittent confusion but able to make needs known. Patient denies pain. States that she tries to participate in activities and go outside on the porch each day. Patient is able to propel herself in her wheelchair around the facility. She is able to ambulate some with assistance from Physical Therapist using her Rollator walker in the hallway. Requires 1 person assistance with bathing, dressing, transfers and ambulation with walker. Patient feels that she can perform these tasks by herself. Reminded patient of recent fall and to wait for assistance for safety purposes. Patient reports waiting on staff can be difficult for her at times since she has been so used to being able to do these things herself. Expressed her feelings of sadness not having her cat "Harlow Mares" with her. States that she doesn't feel that she will be able to bring her cat to the facility due to not being able to care for her. C/O itching to bilateral arms. Small scabs and bruising noted to bilateral arms. Patient states that she has had skin issues for much of her life. Reports recent issues with constipation and has been given Lactulose x 2 to help. Intake remains good with 3 meals/day. Patient states that she has gained weight and is no longer able to  fasten her jeans around her abdomen. Patient holding a package where daughter had new jeans shipped to patient of a larger size. No swallowing difficulties reported. Incontinent of bladder and wears Depends. After visit, transported patient to her room in her wheelchair because patient wanted to take a nap. Patient adjusting well to facility.  HISTORY OF PRESENT ILLNESS: This is a 84 yo female who now resides at [facility] due to requiring a higher level of care. Continues to be followed by Palliative Care to assess overall condition and assist with symptom management needs.   CODE STATUS: DNR  ADVANCED DIRECTIVES: Y MOST FORM: no PPS: 30%   PHYSICAL EXAM:   VITALS: Today's Vitals   10/21/17 1029  BP: (!) 102/58  Pulse: 79  Resp: 16  SpO2: 96%  PainSc: 0-No pain    LUNGS: clear to auscultation  CARDIAC: Cor RRR EXTREMITIES: Trace edema, non-pitting to bilateral lower extremities SKIN: Bilateral arms with small scabs and multiple small bruises, bruise to L head near eye starting to fade   NEURO: Alert and oriented to person/place, intermittent confusion, generalized weakness, propels self in wheelchair   (Duration of visit and documentation 90 minutes)    Daryl Eastern, RN, BSN

## 2017-10-26 DIAGNOSIS — S41112D Laceration without foreign body of left upper arm, subsequent encounter: Secondary | ICD-10-CM | POA: Diagnosis not present

## 2017-10-26 DIAGNOSIS — C3492 Malignant neoplasm of unspecified part of left bronchus or lung: Secondary | ICD-10-CM | POA: Diagnosis not present

## 2017-10-26 DIAGNOSIS — C7951 Secondary malignant neoplasm of bone: Secondary | ICD-10-CM | POA: Diagnosis not present

## 2017-10-26 DIAGNOSIS — S0532XD Ocular laceration without prolapse or loss of intraocular tissue, left eye, subsequent encounter: Secondary | ICD-10-CM | POA: Diagnosis not present

## 2017-10-26 DIAGNOSIS — I509 Heart failure, unspecified: Secondary | ICD-10-CM | POA: Diagnosis not present

## 2017-10-26 DIAGNOSIS — R234 Changes in skin texture: Secondary | ICD-10-CM | POA: Diagnosis not present

## 2017-10-28 DIAGNOSIS — C3492 Malignant neoplasm of unspecified part of left bronchus or lung: Secondary | ICD-10-CM | POA: Diagnosis not present

## 2017-10-28 DIAGNOSIS — C7951 Secondary malignant neoplasm of bone: Secondary | ICD-10-CM | POA: Diagnosis not present

## 2017-10-28 DIAGNOSIS — I509 Heart failure, unspecified: Secondary | ICD-10-CM | POA: Diagnosis not present

## 2017-10-28 DIAGNOSIS — Z79899 Other long term (current) drug therapy: Secondary | ICD-10-CM | POA: Diagnosis not present

## 2017-10-28 DIAGNOSIS — S41112D Laceration without foreign body of left upper arm, subsequent encounter: Secondary | ICD-10-CM | POA: Diagnosis not present

## 2017-10-28 DIAGNOSIS — R234 Changes in skin texture: Secondary | ICD-10-CM | POA: Diagnosis not present

## 2017-10-28 DIAGNOSIS — S0532XD Ocular laceration without prolapse or loss of intraocular tissue, left eye, subsequent encounter: Secondary | ICD-10-CM | POA: Diagnosis not present

## 2017-10-29 ENCOUNTER — Inpatient Hospital Stay: Payer: Medicare Other | Attending: Hematology & Oncology | Admitting: Hematology & Oncology

## 2017-10-29 ENCOUNTER — Encounter: Payer: Self-pay | Admitting: Hematology & Oncology

## 2017-10-29 ENCOUNTER — Other Ambulatory Visit: Payer: Self-pay

## 2017-10-29 ENCOUNTER — Inpatient Hospital Stay: Payer: Medicare Other

## 2017-10-29 VITALS — BP 88/68 | HR 84 | Temp 98.2°F | Resp 16 | Wt 172.0 lb

## 2017-10-29 DIAGNOSIS — C7A8 Other malignant neuroendocrine tumors: Secondary | ICD-10-CM

## 2017-10-29 DIAGNOSIS — Z79899 Other long term (current) drug therapy: Secondary | ICD-10-CM | POA: Diagnosis not present

## 2017-10-29 DIAGNOSIS — M791 Myalgia, unspecified site: Secondary | ICD-10-CM | POA: Insufficient documentation

## 2017-10-29 DIAGNOSIS — C3492 Malignant neoplasm of unspecified part of left bronchus or lung: Secondary | ICD-10-CM

## 2017-10-29 DIAGNOSIS — R413 Other amnesia: Secondary | ICD-10-CM

## 2017-10-29 DIAGNOSIS — Z9181 History of falling: Secondary | ICD-10-CM | POA: Insufficient documentation

## 2017-10-29 DIAGNOSIS — R002 Palpitations: Secondary | ICD-10-CM | POA: Diagnosis not present

## 2017-10-29 DIAGNOSIS — R0602 Shortness of breath: Secondary | ICD-10-CM | POA: Diagnosis not present

## 2017-10-29 DIAGNOSIS — C787 Secondary malignant neoplasm of liver and intrahepatic bile duct: Secondary | ICD-10-CM

## 2017-10-29 DIAGNOSIS — I4891 Unspecified atrial fibrillation: Secondary | ICD-10-CM | POA: Diagnosis not present

## 2017-10-29 DIAGNOSIS — C7951 Secondary malignant neoplasm of bone: Secondary | ICD-10-CM | POA: Diagnosis not present

## 2017-10-29 DIAGNOSIS — C779 Secondary and unspecified malignant neoplasm of lymph node, unspecified: Secondary | ICD-10-CM | POA: Diagnosis not present

## 2017-10-29 DIAGNOSIS — Z7901 Long term (current) use of anticoagulants: Secondary | ICD-10-CM | POA: Insufficient documentation

## 2017-10-29 DIAGNOSIS — C7B8 Other secondary neuroendocrine tumors: Secondary | ICD-10-CM | POA: Insufficient documentation

## 2017-10-29 LAB — CBC WITH DIFFERENTIAL (CANCER CENTER ONLY)
BASOS PCT: 0 %
Basophils Absolute: 0 10*3/uL (ref 0.0–0.1)
EOS ABS: 0.2 10*3/uL (ref 0.0–0.5)
Eosinophils Relative: 2 %
HCT: 34.2 % — ABNORMAL LOW (ref 34.8–46.6)
Hemoglobin: 10.9 g/dL — ABNORMAL LOW (ref 11.6–15.9)
LYMPHS ABS: 1.2 10*3/uL (ref 0.9–3.3)
Lymphocytes Relative: 14 %
MCH: 32.2 pg (ref 26.0–34.0)
MCHC: 31.9 g/dL — ABNORMAL LOW (ref 32.0–36.0)
MCV: 100.9 fL (ref 81.0–101.0)
Monocytes Absolute: 0.7 10*3/uL (ref 0.1–0.9)
Monocytes Relative: 8 %
Neutro Abs: 6.5 10*3/uL (ref 1.5–6.5)
Neutrophils Relative %: 76 %
PLATELETS: 264 10*3/uL (ref 145–400)
RBC: 3.39 MIL/uL — AB (ref 3.70–5.32)
RDW: 13.9 % (ref 11.1–15.7)
WBC: 8.6 10*3/uL (ref 3.9–10.0)

## 2017-10-29 LAB — CMP (CANCER CENTER ONLY)
ALT: 19 U/L (ref 10–47)
AST: 36 U/L (ref 11–38)
Albumin: 3 g/dL — ABNORMAL LOW (ref 3.5–5.0)
Alkaline Phosphatase: 95 U/L — ABNORMAL HIGH (ref 26–84)
Anion gap: 12 (ref 5–15)
BUN: 18 mg/dL (ref 7–22)
CHLORIDE: 94 mmol/L — AB (ref 98–108)
CO2: 33 mmol/L (ref 18–33)
Calcium: 9.4 mg/dL (ref 8.0–10.3)
Creatinine: 1.2 mg/dL (ref 0.60–1.20)
Glucose, Bld: 113 mg/dL (ref 73–118)
POTASSIUM: 4 mmol/L (ref 3.3–4.7)
SODIUM: 139 mmol/L (ref 128–145)
Total Bilirubin: 0.6 mg/dL (ref 0.2–1.6)
Total Protein: 7 g/dL (ref 6.4–8.1)

## 2017-10-29 LAB — LACTATE DEHYDROGENASE: LDH: 723 U/L — ABNORMAL HIGH (ref 98–192)

## 2017-10-29 NOTE — Progress Notes (Signed)
Hematology and Oncology Follow Up Visit  [name] [id number] [birth year] 84 y.o. 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  She comes in with her daughter.  Her daughter comes in from [place] to help her with her appointments.  She is still add [facility]  She seems to be managing okay.  She comes in in a wheelchair.  She apparently had a fall last week.  She still is not in any shape to take any further chemotherapy for her lung cancer.  She is eating according to the daughter.  She is not having any nausea or vomiting.  Para she is not in any obvious pain.  Para she still has cardiac issues.  She has the atrial fibrillation.  She is not sure when she sees the cardiologist.  She is on Eliquis.  There is been no obvious bleeding.  She has had no change in bowel or bladder habits.  There is been no mouth sores.  She has had no headache.  There is been no blurred vision.    Overall, I would say that her performance status is ECOG 3.    Medications:  Allergies as of 10/29/2017      Reactions   Adhesive [tape] Other (See Comments)   Caused blisters   Amoxicillin Other (See Comments)   Has patient had a PCN reaction causing immediate rash, facial/tongue/throat swelling, SOB or lightheadedness with hypotension: Yes Has patient had a PCN reaction causing severe rash involving mucus membranes or skin necrosis: No Has patient had a PCN reaction that required hospitalization No Has patient had a PCN reaction occurring within the last 10 years: Yes If all of the above answers are "NO", then may proceed with Cephalosporin use. Caused thrush   Ciprofloxacin Other  (See Comments)   Caused thrush   Ultram [tramadol] Other (See Comments)   hypotension      Medication List        Accurate as of 10/29/17  5:37 PM. Always use your most recent med list.          acetaminophen 325 MG tablet Commonly known as:  TYLENOL Take 2 tablets (650 mg total) by mouth every 6 (six) hours as needed for mild pain (or Fever >/= 101).   albuterol 108 (90 Base) MCG/ACT inhaler Commonly known as:  VENTOLIN HFA Inhale 2 puffs into the lungs 2 (two) times daily as needed for wheezing or shortness of breath.   ALPRAZolam 0.5 MG tablet Commonly known as:  XANAX Take 0.5-1 tablets (0.25-0.5 mg total) by mouth 2 (two) times daily as needed for anxiety.   ALPRAZolam 0.25 MG tablet Commonly known as:  XANAX Take 0.25 mg by mouth daily.   apixaban 5 MG Tabs tablet Commonly known as:  ELIQUIS Take 5 mg by mouth 2 (two) times daily.   azelastine 0.1 % nasal spray Commonly known as:  ASTELIN Place 2 sprays into both nostrils at bedtime as needed for rhinitis. Use in each nostril as directed   diltiazem 30 MG tablet Commonly known as:  CARDIZEM Take 1 tablet (30 mg total) by mouth 3 (three) times daily.   DULoxetine 30 MG capsule Commonly known as:  CYMBALTA Take 1 capsule (30 mg total) by mouth 2 (two) times daily.   folic acid 1 MG tablet Commonly known as:  FOLVITE Take 1 tablet (1 mg total) by mouth daily. Take daily starting 5-7 days before Alimta chemo. Continue until 21 days after Alimta completed.   furosemide 40 MG tablet Commonly known as:  LASIX Take 40 mg by mouth.   lactulose 10 GM/15ML solution Commonly known as:  CHRONULAC Take 15 mLs (10 g total) by mouth every 4 (four) hours as needed for mild constipation.   levothyroxine 25 MCG tablet Commonly known as:  SYNTHROID, LEVOTHROID Take 0.5 tablets (12.5 mcg total) by mouth daily before breakfast.   magnesium oxide 400 (241.3 Mg) MG tablet Commonly known as:  MAG-OX Take 2 tablets (800 mg  total) by mouth 2 (two) times daily.   ondansetron 8 MG tablet Commonly known as:  ZOFRAN Take 1 tablet (8 mg total) by mouth every 8 (eight) hours as needed for nausea or vomiting.   permethrin 5 % cream Commonly known as:  ELIMITE   polyethylene glycol powder powder Commonly known as:  GLYCOLAX/MIRALAX Take 17 g by mouth 2 (two) times daily.   potassium chloride SA 20 MEQ tablet Commonly known as:  K-DUR,KLOR-CON Take 20 mEq by mouth.   SYSTANE BALANCE OP Apply 2 drops to eye 3 (three) times daily as needed (for dry/irritated eyes).   triamcinolone cream 0.1 % Commonly known as:  KENALOG   vitamin C 1000 MG tablet Take 1,000 mg by mouth daily.   Vitamin D 400 UNIT/ML Liqd Take 400 Units by mouth daily.       Allergies:  Allergies  Allergen Reactions  . Adhesive [Tape] Other (See Comments)    Caused blisters  . Amoxicillin Other (See Comments)    Has patient had a PCN reaction causing immediate rash, facial/tongue/throat swelling, SOB or lightheadedness with hypotension: Yes Has patient had a PCN reaction causing severe rash involving mucus membranes or skin necrosis: No Has patient had a PCN reaction that required hospitalization No Has patient had a PCN reaction occurring within the last 10 years: Yes If all of the above answers are "NO", then may proceed with Cephalosporin use.   Caused thrush  . Ciprofloxacin Other (See Comments)    Caused thrush  . Ultram [Tramadol] Other (See Comments)    hypotension    Past Medical History, Surgical history, Social history, and Family History were reviewed and updated.  Review of Systems: Review of Systems  Constitutional: Negative.   HENT: Negative.   Eyes: Negative.   Respiratory: Positive for shortness of breath.   Cardiovascular: Positive for palpitations.  Gastrointestinal: Positive for constipation.  Genitourinary: Positive for frequency.  Musculoskeletal: Positive for joint pain and myalgias.  Skin:  Negative.   Neurological: Negative.   Endo/Heme/Allergies: Negative.   Psychiatric/Behavioral: Negative.      Physical Exam:  weight is 172 lb (78 kg). Her oral temperature is 98.2 F (36.8 C). Her blood pressure is 88/68 (abnormal) and her pulse is 84. Her respiration is 16 and oxygen saturation is 96%.   Wt Readings from Last 3 Encounters:  10/29/17 172 lb (78 kg)  09/23/17 170 lb (77.1 kg)  09/22/17 177 lb (80.3 kg)    Physical Exam  Constitutional: She is oriented to person, place, and time.  HENT:  Head: Normocephalic and atraumatic.  Mouth/Throat: Oropharynx is clear and moist.  Eyes: Pupils are equal, round, and reactive to light. EOM are normal.  Neck: Normal range of motion.  Cardiovascular: Normal rate, regular rhythm and normal heart sounds.  Pulmonary/Chest: Effort normal and breath sounds normal.  Abdominal: Soft. Bowel sounds are normal.  Musculoskeletal: Normal range of motion. She exhibits no edema, tenderness or deformity.  Lymphadenopathy:    She has no cervical adenopathy.  Neurological: She is alert and oriented to person, place, and time.  Skin: Skin is warm and dry. No rash noted. No erythema.  Psychiatric: She has a normal mood and affect. Her behavior is normal. Judgment and thought content normal.  Vitals reviewed.     Lab Results  Component Value Date   WBC 8.6 10/29/2017   HGB 10.9 (L) 10/29/2017   HCT 34.2 (L) 10/29/2017   MCV 100.9 10/29/2017   PLT 264 10/29/2017   Lab Results  Component Value Date   FERRITIN 44.1 12/05/2016   IRON 47 12/05/2016   TIBC 159 (L) 04/10/2007   UIBC 148 04/10/2007   IRONPCTSAT 7 (L) 04/10/2007   Lab Results  Component Value Date   RBC 3.39 (L) 10/29/2017   No results found for: Nils Pyle [facility] Lab Results  Component Value Date   IGGSERUM 854 02/12/2015   IGA 288 02/12/2015   IGMSERUM 217 02/12/2015   Lab Results  Component Value Date   TOTALPROTELP 6.8 02/12/2015    ALBUMINELP 4.1 02/12/2015   A1GS 0.3 02/12/2015   A2GS 0.7 02/12/2015   BETS 0.5 02/12/2015   BETA2SER 0.3 02/12/2015   GAMS 0.9 02/12/2015   SPEI SEE NOTE 02/12/2015     Chemistry      Component Value Date/Time   NA 139 10/29/2017 1034   NA 147 (H) 04/10/2017 1141   K 4.0 10/29/2017 1034   K 3.6 04/10/2017 1141   CL 94 (L) 10/29/2017 1034   CL 107 04/10/2017 1141   CO2 33 10/29/2017 1034   CO2 28 04/10/2017 1141   BUN 18 10/29/2017 1034   BUN 14 04/10/2017 1141   CREATININE 1.20 10/29/2017 1034   CREATININE 1.0 04/10/2017 1141   GLU 68 09/05/2016      Component Value Date/Time   CALCIUM 9.4 10/29/2017 1034   CALCIUM 9.5 04/10/2017 1141   ALKPHOS 95 (H) 10/29/2017 1034   ALKPHOS 84 04/10/2017 1141   AST 36 10/29/2017 1034   ALT 19 10/29/2017 1034   ALT 23 04/10/2017 1141   BILITOT 0.6 10/29/2017 1034       Impression and Plan: Ms. 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   She still is not a candidate for any chemotherapy.  Even single agent immunotherapy I think would be difficult for her to handle.  We will see about getting a PET scan set up in about 3-4 weeks so we can see how her tumor is progressing.  Hopefully, it is not progressing quickly.  Her quality of life is what we are focused on.  Her quality of life right now is just not good enough that she would be able to manage with chemotherapy or immunotherapy.  She still has the memory issues.  I would like to plan to see her back in another 4 weeks or so.  I want to see her back after she has her scan done.     Volanda Napoleon, MD 7/11/20195:37 PM

## 2017-10-30 DIAGNOSIS — S41112D Laceration without foreign body of left upper arm, subsequent encounter: Secondary | ICD-10-CM | POA: Diagnosis not present

## 2017-10-30 DIAGNOSIS — R234 Changes in skin texture: Secondary | ICD-10-CM | POA: Diagnosis not present

## 2017-10-30 DIAGNOSIS — I509 Heart failure, unspecified: Secondary | ICD-10-CM | POA: Diagnosis not present

## 2017-10-30 DIAGNOSIS — C7951 Secondary malignant neoplasm of bone: Secondary | ICD-10-CM | POA: Diagnosis not present

## 2017-10-30 DIAGNOSIS — S0532XD Ocular laceration without prolapse or loss of intraocular tissue, left eye, subsequent encounter: Secondary | ICD-10-CM | POA: Diagnosis not present

## 2017-10-30 DIAGNOSIS — C3492 Malignant neoplasm of unspecified part of left bronchus or lung: Secondary | ICD-10-CM | POA: Diagnosis not present

## 2017-11-03 DIAGNOSIS — I509 Heart failure, unspecified: Secondary | ICD-10-CM | POA: Diagnosis not present

## 2017-11-03 DIAGNOSIS — C7951 Secondary malignant neoplasm of bone: Secondary | ICD-10-CM | POA: Diagnosis not present

## 2017-11-03 DIAGNOSIS — R234 Changes in skin texture: Secondary | ICD-10-CM | POA: Diagnosis not present

## 2017-11-03 DIAGNOSIS — S41112D Laceration without foreign body of left upper arm, subsequent encounter: Secondary | ICD-10-CM | POA: Diagnosis not present

## 2017-11-03 DIAGNOSIS — S0532XD Ocular laceration without prolapse or loss of intraocular tissue, left eye, subsequent encounter: Secondary | ICD-10-CM | POA: Diagnosis not present

## 2017-11-03 DIAGNOSIS — C3492 Malignant neoplasm of unspecified part of left bronchus or lung: Secondary | ICD-10-CM | POA: Diagnosis not present

## 2017-11-05 DIAGNOSIS — C3492 Malignant neoplasm of unspecified part of left bronchus or lung: Secondary | ICD-10-CM | POA: Diagnosis not present

## 2017-11-05 DIAGNOSIS — R234 Changes in skin texture: Secondary | ICD-10-CM | POA: Diagnosis not present

## 2017-11-05 DIAGNOSIS — I509 Heart failure, unspecified: Secondary | ICD-10-CM | POA: Diagnosis not present

## 2017-11-05 DIAGNOSIS — S41112D Laceration without foreign body of left upper arm, subsequent encounter: Secondary | ICD-10-CM | POA: Diagnosis not present

## 2017-11-05 DIAGNOSIS — S0532XD Ocular laceration without prolapse or loss of intraocular tissue, left eye, subsequent encounter: Secondary | ICD-10-CM | POA: Diagnosis not present

## 2017-11-05 DIAGNOSIS — C7951 Secondary malignant neoplasm of bone: Secondary | ICD-10-CM | POA: Diagnosis not present

## 2017-11-06 DIAGNOSIS — C3492 Malignant neoplasm of unspecified part of left bronchus or lung: Secondary | ICD-10-CM | POA: Diagnosis not present

## 2017-11-06 DIAGNOSIS — S41112D Laceration without foreign body of left upper arm, subsequent encounter: Secondary | ICD-10-CM | POA: Diagnosis not present

## 2017-11-06 DIAGNOSIS — R234 Changes in skin texture: Secondary | ICD-10-CM | POA: Diagnosis not present

## 2017-11-06 DIAGNOSIS — I509 Heart failure, unspecified: Secondary | ICD-10-CM | POA: Diagnosis not present

## 2017-11-06 DIAGNOSIS — S0532XD Ocular laceration without prolapse or loss of intraocular tissue, left eye, subsequent encounter: Secondary | ICD-10-CM | POA: Diagnosis not present

## 2017-11-06 DIAGNOSIS — C7951 Secondary malignant neoplasm of bone: Secondary | ICD-10-CM | POA: Diagnosis not present

## 2017-11-09 DIAGNOSIS — R234 Changes in skin texture: Secondary | ICD-10-CM | POA: Diagnosis not present

## 2017-11-09 DIAGNOSIS — S0532XD Ocular laceration without prolapse or loss of intraocular tissue, left eye, subsequent encounter: Secondary | ICD-10-CM | POA: Diagnosis not present

## 2017-11-09 DIAGNOSIS — C7951 Secondary malignant neoplasm of bone: Secondary | ICD-10-CM | POA: Diagnosis not present

## 2017-11-09 DIAGNOSIS — C3492 Malignant neoplasm of unspecified part of left bronchus or lung: Secondary | ICD-10-CM | POA: Diagnosis not present

## 2017-11-09 DIAGNOSIS — I509 Heart failure, unspecified: Secondary | ICD-10-CM | POA: Diagnosis not present

## 2017-11-09 DIAGNOSIS — S41112D Laceration without foreign body of left upper arm, subsequent encounter: Secondary | ICD-10-CM | POA: Diagnosis not present

## 2017-11-10 DIAGNOSIS — C3490 Malignant neoplasm of unspecified part of unspecified bronchus or lung: Secondary | ICD-10-CM | POA: Diagnosis not present

## 2017-11-10 DIAGNOSIS — R4181 Age-related cognitive decline: Secondary | ICD-10-CM | POA: Diagnosis not present

## 2017-11-10 DIAGNOSIS — E039 Hypothyroidism, unspecified: Secondary | ICD-10-CM | POA: Diagnosis not present

## 2017-11-10 DIAGNOSIS — R627 Adult failure to thrive: Secondary | ICD-10-CM | POA: Diagnosis not present

## 2017-11-12 DIAGNOSIS — C787 Secondary malignant neoplasm of liver and intrahepatic bile duct: Secondary | ICD-10-CM | POA: Diagnosis not present

## 2017-11-12 DIAGNOSIS — J301 Allergic rhinitis due to pollen: Secondary | ICD-10-CM | POA: Diagnosis not present

## 2017-11-12 DIAGNOSIS — C771 Secondary and unspecified malignant neoplasm of intrathoracic lymph nodes: Secondary | ICD-10-CM | POA: Diagnosis not present

## 2017-11-12 DIAGNOSIS — C3490 Malignant neoplasm of unspecified part of unspecified bronchus or lung: Secondary | ICD-10-CM | POA: Diagnosis not present

## 2017-11-12 DIAGNOSIS — J910 Malignant pleural effusion: Secondary | ICD-10-CM | POA: Diagnosis not present

## 2017-11-12 DIAGNOSIS — C7951 Secondary malignant neoplasm of bone: Secondary | ICD-10-CM | POA: Diagnosis not present

## 2017-11-12 DIAGNOSIS — E039 Hypothyroidism, unspecified: Secondary | ICD-10-CM | POA: Diagnosis not present

## 2017-11-12 DIAGNOSIS — I4891 Unspecified atrial fibrillation: Secondary | ICD-10-CM | POA: Diagnosis not present

## 2017-11-12 DIAGNOSIS — J9600 Acute respiratory failure, unspecified whether with hypoxia or hypercapnia: Secondary | ICD-10-CM | POA: Diagnosis not present

## 2017-11-13 DIAGNOSIS — C3490 Malignant neoplasm of unspecified part of unspecified bronchus or lung: Secondary | ICD-10-CM | POA: Diagnosis not present

## 2017-11-13 DIAGNOSIS — J9600 Acute respiratory failure, unspecified whether with hypoxia or hypercapnia: Secondary | ICD-10-CM | POA: Diagnosis not present

## 2017-11-13 DIAGNOSIS — J910 Malignant pleural effusion: Secondary | ICD-10-CM | POA: Diagnosis not present

## 2017-11-13 DIAGNOSIS — C787 Secondary malignant neoplasm of liver and intrahepatic bile duct: Secondary | ICD-10-CM | POA: Diagnosis not present

## 2017-11-13 DIAGNOSIS — C7951 Secondary malignant neoplasm of bone: Secondary | ICD-10-CM | POA: Diagnosis not present

## 2017-11-13 DIAGNOSIS — C771 Secondary and unspecified malignant neoplasm of intrathoracic lymph nodes: Secondary | ICD-10-CM | POA: Diagnosis not present

## 2017-11-16 ENCOUNTER — Encounter: Payer: BLUE CROSS/BLUE SHIELD | Admitting: *Deleted

## 2017-11-16 DIAGNOSIS — C771 Secondary and unspecified malignant neoplasm of intrathoracic lymph nodes: Secondary | ICD-10-CM | POA: Diagnosis not present

## 2017-11-16 DIAGNOSIS — C787 Secondary malignant neoplasm of liver and intrahepatic bile duct: Secondary | ICD-10-CM | POA: Diagnosis not present

## 2017-11-16 DIAGNOSIS — C3490 Malignant neoplasm of unspecified part of unspecified bronchus or lung: Secondary | ICD-10-CM | POA: Diagnosis not present

## 2017-11-16 DIAGNOSIS — J910 Malignant pleural effusion: Secondary | ICD-10-CM | POA: Diagnosis not present

## 2017-11-16 DIAGNOSIS — C7951 Secondary malignant neoplasm of bone: Secondary | ICD-10-CM | POA: Diagnosis not present

## 2017-11-16 DIAGNOSIS — J9600 Acute respiratory failure, unspecified whether with hypoxia or hypercapnia: Secondary | ICD-10-CM | POA: Diagnosis not present

## 2017-11-17 DIAGNOSIS — C787 Secondary malignant neoplasm of liver and intrahepatic bile duct: Secondary | ICD-10-CM | POA: Diagnosis not present

## 2017-11-17 DIAGNOSIS — C7951 Secondary malignant neoplasm of bone: Secondary | ICD-10-CM | POA: Diagnosis not present

## 2017-11-17 DIAGNOSIS — J910 Malignant pleural effusion: Secondary | ICD-10-CM | POA: Diagnosis not present

## 2017-11-17 DIAGNOSIS — C771 Secondary and unspecified malignant neoplasm of intrathoracic lymph nodes: Secondary | ICD-10-CM | POA: Diagnosis not present

## 2017-11-17 DIAGNOSIS — J9600 Acute respiratory failure, unspecified whether with hypoxia or hypercapnia: Secondary | ICD-10-CM | POA: Diagnosis not present

## 2017-11-17 DIAGNOSIS — C3490 Malignant neoplasm of unspecified part of unspecified bronchus or lung: Secondary | ICD-10-CM | POA: Diagnosis not present

## 2017-11-18 DIAGNOSIS — J9600 Acute respiratory failure, unspecified whether with hypoxia or hypercapnia: Secondary | ICD-10-CM | POA: Diagnosis not present

## 2017-11-18 DIAGNOSIS — C3490 Malignant neoplasm of unspecified part of unspecified bronchus or lung: Secondary | ICD-10-CM | POA: Diagnosis not present

## 2017-11-18 DIAGNOSIS — C787 Secondary malignant neoplasm of liver and intrahepatic bile duct: Secondary | ICD-10-CM | POA: Diagnosis not present

## 2017-11-18 DIAGNOSIS — J910 Malignant pleural effusion: Secondary | ICD-10-CM | POA: Diagnosis not present

## 2017-11-18 DIAGNOSIS — C771 Secondary and unspecified malignant neoplasm of intrathoracic lymph nodes: Secondary | ICD-10-CM | POA: Diagnosis not present

## 2017-11-18 DIAGNOSIS — C7951 Secondary malignant neoplasm of bone: Secondary | ICD-10-CM | POA: Diagnosis not present

## 2017-11-19 ENCOUNTER — Telehealth: Payer: Self-pay | Admitting: *Deleted

## 2017-11-19 DIAGNOSIS — C787 Secondary malignant neoplasm of liver and intrahepatic bile duct: Secondary | ICD-10-CM | POA: Diagnosis not present

## 2017-11-19 DIAGNOSIS — I4891 Unspecified atrial fibrillation: Secondary | ICD-10-CM | POA: Diagnosis not present

## 2017-11-19 DIAGNOSIS — C7951 Secondary malignant neoplasm of bone: Secondary | ICD-10-CM | POA: Diagnosis not present

## 2017-11-19 DIAGNOSIS — C771 Secondary and unspecified malignant neoplasm of intrathoracic lymph nodes: Secondary | ICD-10-CM | POA: Diagnosis not present

## 2017-11-19 DIAGNOSIS — E039 Hypothyroidism, unspecified: Secondary | ICD-10-CM | POA: Diagnosis not present

## 2017-11-19 DIAGNOSIS — J910 Malignant pleural effusion: Secondary | ICD-10-CM | POA: Diagnosis not present

## 2017-11-19 DIAGNOSIS — C3490 Malignant neoplasm of unspecified part of unspecified bronchus or lung: Secondary | ICD-10-CM | POA: Diagnosis not present

## 2017-11-19 DIAGNOSIS — J9600 Acute respiratory failure, unspecified whether with hypoxia or hypercapnia: Secondary | ICD-10-CM | POA: Diagnosis not present

## 2017-11-19 DIAGNOSIS — J301 Allergic rhinitis due to pollen: Secondary | ICD-10-CM | POA: Diagnosis not present

## 2017-11-27 ENCOUNTER — Encounter (HOSPITAL_COMMUNITY): Payer: Medicare Other

## 2017-12-03 ENCOUNTER — Ambulatory Visit: Payer: Self-pay | Admitting: Hematology & Oncology

## 2017-12-03 ENCOUNTER — Other Ambulatory Visit: Payer: Self-pay

## 2017-12-14 ENCOUNTER — Telehealth: Payer: Self-pay | Admitting: Medical

## 2017-12-14 NOTE — Telephone Encounter (Signed)
Daughter called and states mom passed away. So can you change her status to deceased so she does not get any reminders on office visit reminders

## 2017-12-14 NOTE — Telephone Encounter (Signed)
Copied from [place] [phone]. Topic: General - Other >> Dec 14, 2017 12:16 PM Yvette Rack wrote: Reason for CRM: pt daughter calling stating that her mother passed on 12/17/17 and wanted [facility] to know this they had received a call that she has an appt

## 2017-12-14 NOTE — Telephone Encounter (Signed)
Called pt and LVM informing pt that Glenard Haring is no longer in the office on Wednesdays and would need to reschedule. Advised pt to call back and reschedule at he convenience.

## 2017-12-18 ENCOUNTER — Encounter

## 2017-12-18 ENCOUNTER — Ambulatory Visit: Payer: Self-pay | Admitting: Neurology

## 2017-12-20 NOTE — Telephone Encounter (Signed)
Received notification that patient passed away, today, at 2:23p at [facility].   Dr Marin Olp notified.

## 2017-12-20 DEATH — deceased

## 2018-01-13 ENCOUNTER — Ambulatory Visit: Payer: Self-pay | Admitting: *Deleted

## 2018-09-08 ENCOUNTER — Telehealth: Payer: Self-pay | Admitting: Hematology & Oncology

## 2018-09-08 NOTE — Telephone Encounter (Signed)
Faxed medical records to [facility] Attn: Lyndee Hensen. Release [id number]

## 2018-10-05 ENCOUNTER — Telehealth: Payer: Self-pay | Admitting: Hematology & Oncology

## 2018-10-05 NOTE — Telephone Encounter (Signed)
Refaxed medical record as requested to Lyndee Hensen with [facility] on 09/08/18. Release [id number]

## 2018-11-09 ENCOUNTER — Telehealth: Payer: Self-pay | Admitting: Medical

## 2018-11-09 NOTE — Telephone Encounter (Signed)
Copied from [place] [phone]. Topic: General - Other >> Nov 09, 2018  1:13 PM Keene Breath wrote: Reason for CRM: [place], called to request the notes from the patient's visit on 03/16/17 with Dr. Harvie Heck.  Stated that they need the notes, Chest x-ray and Head CT.  Please advise and call back with any questions.  CB# [phone]

## 2018-11-09 NOTE — Telephone Encounter (Signed)
Pt deceased
